# Patient Record
Sex: Male | Born: 1950
Health system: Southern US, Community
[De-identification: ages and names within clinical notes are randomized; demographics above are authoritative.]

## PROBLEM LIST (undated history)

## (undated) DIAGNOSIS — K219 Gastro-esophageal reflux disease without esophagitis: Secondary | ICD-10-CM

## (undated) DIAGNOSIS — R011 Cardiac murmur, unspecified: Secondary | ICD-10-CM

## (undated) DIAGNOSIS — D571 Sickle-cell disease without crisis: Secondary | ICD-10-CM

## (undated) DIAGNOSIS — J189 Pneumonia, unspecified organism: Secondary | ICD-10-CM

## (undated) DIAGNOSIS — E785 Hyperlipidemia, unspecified: Secondary | ICD-10-CM

## (undated) DIAGNOSIS — H269 Unspecified cataract: Secondary | ICD-10-CM

## (undated) DIAGNOSIS — Z973 Presence of spectacles and contact lenses: Secondary | ICD-10-CM

## (undated) DIAGNOSIS — Z87442 Personal history of urinary calculi: Secondary | ICD-10-CM

## (undated) DIAGNOSIS — J383 Other diseases of vocal cords: Secondary | ICD-10-CM

## (undated) DIAGNOSIS — C801 Malignant (primary) neoplasm, unspecified: Secondary | ICD-10-CM

## (undated) DIAGNOSIS — N289 Disorder of kidney and ureter, unspecified: Secondary | ICD-10-CM

## (undated) DIAGNOSIS — I10 Essential (primary) hypertension: Secondary | ICD-10-CM

## (undated) DIAGNOSIS — M1990 Unspecified osteoarthritis, unspecified site: Secondary | ICD-10-CM

## (undated) HISTORY — PX: COLON SURGERY: SHX602

## (undated) HISTORY — PX: LEG SURGERY: SHX1003

## (undated) HISTORY — PX: COLONOSCOPY: SHX174

## (undated) HISTORY — PX: CATARACT EXTRACTION W/ INTRAOCULAR LENS  IMPLANT, BILATERAL: SHX1307

## (undated) HISTORY — PX: CHOLECYSTECTOMY: SHX55

---

## 1998-01-19 ENCOUNTER — Encounter: Payer: Self-pay | Admitting: Family Medicine

## 1998-01-19 ENCOUNTER — Inpatient Hospital Stay (HOSPITAL_COMMUNITY): Admission: AD | Admit: 1998-01-19 | Discharge: 1998-01-27 | Payer: Self-pay | Admitting: Family Medicine

## 1998-11-20 ENCOUNTER — Inpatient Hospital Stay (HOSPITAL_COMMUNITY): Admission: EM | Admit: 1998-11-20 | Discharge: 1998-11-30 | Payer: Self-pay | Admitting: Emergency Medicine

## 1998-11-20 ENCOUNTER — Encounter: Payer: Self-pay | Admitting: Family Medicine

## 1998-11-21 ENCOUNTER — Encounter: Payer: Self-pay | Admitting: Family Medicine

## 1999-12-04 ENCOUNTER — Encounter: Payer: Self-pay | Admitting: Family Medicine

## 1999-12-04 ENCOUNTER — Inpatient Hospital Stay (HOSPITAL_COMMUNITY): Admission: EM | Admit: 1999-12-04 | Discharge: 1999-12-10 | Payer: Self-pay | Admitting: Emergency Medicine

## 1999-12-07 ENCOUNTER — Encounter: Payer: Self-pay | Admitting: Family Medicine

## 1999-12-09 ENCOUNTER — Encounter: Payer: Self-pay | Admitting: Family Medicine

## 2000-08-16 ENCOUNTER — Encounter: Payer: Self-pay | Admitting: Family Medicine

## 2000-08-16 ENCOUNTER — Inpatient Hospital Stay (HOSPITAL_COMMUNITY): Admission: EM | Admit: 2000-08-16 | Discharge: 2000-08-26 | Payer: Self-pay | Admitting: *Deleted

## 2000-08-17 ENCOUNTER — Encounter: Payer: Self-pay | Admitting: Family Medicine

## 2000-08-19 ENCOUNTER — Encounter: Payer: Self-pay | Admitting: Family Medicine

## 2000-08-24 ENCOUNTER — Encounter: Payer: Self-pay | Admitting: Dentistry

## 2002-10-27 ENCOUNTER — Encounter: Payer: Self-pay | Admitting: Emergency Medicine

## 2002-10-27 ENCOUNTER — Inpatient Hospital Stay (HOSPITAL_COMMUNITY): Admission: EM | Admit: 2002-10-27 | Discharge: 2002-11-11 | Payer: Self-pay | Admitting: Emergency Medicine

## 2002-10-27 ENCOUNTER — Encounter: Payer: Self-pay | Admitting: Family Medicine

## 2002-10-30 ENCOUNTER — Encounter: Payer: Self-pay | Admitting: Family Medicine

## 2002-11-03 ENCOUNTER — Encounter: Payer: Self-pay | Admitting: Family Medicine

## 2002-11-07 ENCOUNTER — Encounter: Payer: Self-pay | Admitting: Family Medicine

## 2003-08-25 ENCOUNTER — Emergency Department (HOSPITAL_COMMUNITY): Admission: EM | Admit: 2003-08-25 | Discharge: 2003-08-25 | Payer: Self-pay | Admitting: *Deleted

## 2003-08-26 ENCOUNTER — Inpatient Hospital Stay (HOSPITAL_COMMUNITY): Admission: EM | Admit: 2003-08-26 | Discharge: 2003-09-23 | Payer: Self-pay | Admitting: Family Medicine

## 2004-04-08 ENCOUNTER — Emergency Department (HOSPITAL_COMMUNITY): Admission: EM | Admit: 2004-04-08 | Discharge: 2004-04-08 | Payer: Self-pay | Admitting: Emergency Medicine

## 2004-04-09 ENCOUNTER — Inpatient Hospital Stay (HOSPITAL_COMMUNITY): Admission: EM | Admit: 2004-04-09 | Discharge: 2004-04-14 | Payer: Self-pay | Admitting: Emergency Medicine

## 2005-05-18 ENCOUNTER — Encounter: Payer: Self-pay | Admitting: Internal Medicine

## 2005-05-19 ENCOUNTER — Ambulatory Visit: Payer: Self-pay | Admitting: Dentistry

## 2005-05-19 ENCOUNTER — Inpatient Hospital Stay (HOSPITAL_COMMUNITY): Admission: EM | Admit: 2005-05-19 | Discharge: 2005-05-25 | Payer: Self-pay | Admitting: Internal Medicine

## 2005-05-23 ENCOUNTER — Encounter (INDEPENDENT_AMBULATORY_CARE_PROVIDER_SITE_OTHER): Payer: Self-pay | Admitting: *Deleted

## 2005-06-14 ENCOUNTER — Encounter: Admission: RE | Admit: 2005-06-14 | Discharge: 2005-09-12 | Payer: Self-pay | Admitting: Family Medicine

## 2005-08-21 ENCOUNTER — Inpatient Hospital Stay (HOSPITAL_COMMUNITY): Admission: RE | Admit: 2005-08-21 | Discharge: 2005-09-01 | Payer: Self-pay | Admitting: Internal Medicine

## 2007-02-24 ENCOUNTER — Emergency Department (HOSPITAL_COMMUNITY): Admission: EM | Admit: 2007-02-24 | Discharge: 2007-02-24 | Payer: Self-pay | Admitting: Emergency Medicine

## 2007-08-24 ENCOUNTER — Emergency Department (HOSPITAL_COMMUNITY): Admission: EM | Admit: 2007-08-24 | Discharge: 2007-08-24 | Payer: Self-pay | Admitting: Emergency Medicine

## 2007-09-29 ENCOUNTER — Observation Stay (HOSPITAL_COMMUNITY): Admission: EM | Admit: 2007-09-29 | Discharge: 2007-10-01 | Payer: Self-pay | Admitting: Emergency Medicine

## 2007-09-29 ENCOUNTER — Ambulatory Visit: Payer: Self-pay | Admitting: Internal Medicine

## 2008-09-10 ENCOUNTER — Encounter: Payer: Self-pay | Admitting: Cardiology

## 2008-09-10 ENCOUNTER — Inpatient Hospital Stay (HOSPITAL_COMMUNITY): Admission: EM | Admit: 2008-09-10 | Discharge: 2008-09-13 | Payer: Self-pay | Admitting: Emergency Medicine

## 2008-09-10 ENCOUNTER — Ambulatory Visit: Payer: Self-pay | Admitting: Cardiology

## 2009-03-16 ENCOUNTER — Emergency Department (HOSPITAL_COMMUNITY): Admission: EM | Admit: 2009-03-16 | Discharge: 2009-03-16 | Payer: Self-pay | Admitting: Emergency Medicine

## 2009-04-25 ENCOUNTER — Emergency Department (HOSPITAL_COMMUNITY): Admission: EM | Admit: 2009-04-25 | Discharge: 2009-04-26 | Payer: Self-pay | Admitting: Emergency Medicine

## 2010-02-18 ENCOUNTER — Inpatient Hospital Stay (HOSPITAL_COMMUNITY): Admission: EM | Admit: 2010-02-18 | Discharge: 2010-02-22 | Payer: Self-pay | Admitting: Emergency Medicine

## 2010-08-03 ENCOUNTER — Emergency Department (HOSPITAL_COMMUNITY)
Admission: EM | Admit: 2010-08-03 | Discharge: 2010-08-03 | Disposition: A | Discharge status: Home / Self Care | Payer: Medicare Other | Attending: Emergency Medicine | Admitting: Emergency Medicine

## 2010-08-03 ENCOUNTER — Emergency Department (HOSPITAL_COMMUNITY): Payer: Medicare Other

## 2010-08-03 DIAGNOSIS — Z79899 Other long term (current) drug therapy: Secondary | ICD-10-CM | POA: Insufficient documentation

## 2010-08-03 DIAGNOSIS — Z85038 Personal history of other malignant neoplasm of large intestine: Secondary | ICD-10-CM | POA: Insufficient documentation

## 2010-08-03 DIAGNOSIS — R079 Chest pain, unspecified: Secondary | ICD-10-CM | POA: Insufficient documentation

## 2010-08-03 DIAGNOSIS — E119 Type 2 diabetes mellitus without complications: Secondary | ICD-10-CM | POA: Insufficient documentation

## 2010-08-03 DIAGNOSIS — M545 Low back pain, unspecified: Secondary | ICD-10-CM | POA: Insufficient documentation

## 2010-08-03 DIAGNOSIS — I10 Essential (primary) hypertension: Secondary | ICD-10-CM | POA: Insufficient documentation

## 2010-08-03 DIAGNOSIS — D5700 Hb-SS disease with crisis, unspecified: Secondary | ICD-10-CM | POA: Insufficient documentation

## 2010-08-03 LAB — DIFFERENTIAL
Basophils Relative: 1 % (ref 0–1)
Eosinophils Absolute: 0.8 10*3/uL — ABNORMAL HIGH (ref 0.0–0.7)
Lymphocytes Relative: 16 % (ref 12–46)
Neutrophils Relative %: 69 % (ref 43–77)

## 2010-08-03 LAB — CK TOTAL AND CKMB (NOT AT ARMC)
CK, MB: 6.2 ng/mL (ref 0.3–4.0)
Relative Index: 2.5 (ref 0.0–2.5)
Total CK: 253 U/L — ABNORMAL HIGH (ref 7–232)

## 2010-08-03 LAB — COMPREHENSIVE METABOLIC PANEL
AST: 33 U/L (ref 0–37)
Albumin: 4 g/dL (ref 3.5–5.2)
Alkaline Phosphatase: 72 U/L (ref 39–117)
BUN: 14 mg/dL (ref 6–23)
Chloride: 103 mEq/L (ref 96–112)
GFR calc Af Amer: 49 mL/min — ABNORMAL LOW (ref >60)
Potassium: 4.4 mEq/L (ref 3.5–5.1)
Total Protein: 7.2 g/dL (ref 6.0–8.3)

## 2010-08-03 LAB — POCT CARDIAC MARKERS
CKMB, poc: 2.9 ng/mL (ref 1.0–8.0)
Myoglobin, poc: 179 ng/mL (ref 12–200)

## 2010-08-03 LAB — CBC
MCV: 67.1 fL — ABNORMAL LOW (ref 78.0–100.0)
Platelets: 330 10*3/uL (ref 150–400)
RBC: 4.01 MIL/uL — ABNORMAL LOW (ref 4.22–5.81)
WBC: 19.6 10*3/uL — ABNORMAL HIGH (ref 4.0–10.5)

## 2010-08-03 LAB — RETICULOCYTES
RBC.: 4.01 MIL/uL — ABNORMAL LOW (ref 4.22–5.81)
Retic Ct Pct: 5 % — ABNORMAL HIGH (ref 0.4–3.1)

## 2010-08-04 ENCOUNTER — Inpatient Hospital Stay (HOSPITAL_COMMUNITY)
Admission: EM | Admit: 2010-08-04 | Discharge: 2010-08-07 | DRG: 812 | Disposition: A | Discharge status: Home / Self Care | Payer: Medicare Other | Attending: Internal Medicine | Admitting: Internal Medicine

## 2010-08-04 DIAGNOSIS — D5700 Hb-SS disease with crisis, unspecified: Principal | ICD-10-CM | POA: Diagnosis present

## 2010-08-04 DIAGNOSIS — R17 Unspecified jaundice: Secondary | ICD-10-CM | POA: Diagnosis present

## 2010-08-04 DIAGNOSIS — H2000 Unspecified acute and subacute iridocyclitis: Secondary | ICD-10-CM | POA: Diagnosis present

## 2010-08-04 DIAGNOSIS — K219 Gastro-esophageal reflux disease without esophagitis: Secondary | ICD-10-CM | POA: Diagnosis present

## 2010-08-04 DIAGNOSIS — E119 Type 2 diabetes mellitus without complications: Secondary | ICD-10-CM | POA: Diagnosis present

## 2010-08-04 DIAGNOSIS — I10 Essential (primary) hypertension: Secondary | ICD-10-CM | POA: Diagnosis present

## 2010-08-04 DIAGNOSIS — F1410 Cocaine abuse, uncomplicated: Secondary | ICD-10-CM | POA: Diagnosis present

## 2010-08-04 DIAGNOSIS — E785 Hyperlipidemia, unspecified: Secondary | ICD-10-CM | POA: Diagnosis present

## 2010-08-04 LAB — GLUCOSE, CAPILLARY: Glucose-Capillary: 273 mg/dL — ABNORMAL HIGH (ref 70–99)

## 2010-08-05 ENCOUNTER — Emergency Department (HOSPITAL_COMMUNITY): Payer: Medicare Other

## 2010-08-05 LAB — CBC
HCT: 28.2 % — ABNORMAL LOW (ref 39.0–52.0)
HCT: 30 % — ABNORMAL LOW (ref 39.0–52.0)
Hemoglobin: 9.9 g/dL — ABNORMAL LOW (ref 13.0–17.0)
MCHC: 35.1 g/dL (ref 30.0–36.0)
MCHC: 33 g/dL (ref 30.0–36.0)
MCV: 68.6 fL — ABNORMAL LOW (ref 78.0–100.0)
RDW: 17.3 % — ABNORMAL HIGH (ref 11.5–15.5)

## 2010-08-05 LAB — COMPREHENSIVE METABOLIC PANEL
ALT: 23 U/L (ref 0–53)
Albumin: 4.3 g/dL (ref 3.5–5.2)
Alkaline Phosphatase: 81 U/L (ref 39–117)
Alkaline Phosphatase: 76 U/L (ref 39–117)
BUN: 11 mg/dL (ref 6–23)
CO2: 25 mEq/L (ref 19–32)
CO2: 25 mEq/L (ref 19–32)
Calcium: 9.7 mg/dL (ref 8.4–10.5)
Chloride: 100 mEq/L (ref 96–112)
GFR calc non Af Amer: 59 mL/min — ABNORMAL LOW (ref >60)
GFR calc non Af Amer: >60 mL/min (ref >60)
Glucose, Bld: 267 mg/dL — ABNORMAL HIGH (ref 70–99)
Glucose, Bld: 180 mg/dL — ABNORMAL HIGH (ref 70–99)
Potassium: 4.2 mEq/L (ref 3.5–5.1)
Potassium: 4.1 mEq/L (ref 3.5–5.1)
Sodium: 138 mEq/L (ref 135–145)
Total Bilirubin: 1.9 mg/dL — ABNORMAL HIGH (ref 0.3–1.2)
Total Bilirubin: 1.7 mg/dL — ABNORMAL HIGH (ref 0.3–1.2)

## 2010-08-05 LAB — POCT CARDIAC MARKERS: Myoglobin, poc: 169 ng/mL (ref 12–200)

## 2010-08-05 LAB — GLUCOSE, CAPILLARY
Glucose-Capillary: 244 mg/dL — ABNORMAL HIGH (ref 70–99)
Glucose-Capillary: 210 mg/dL — ABNORMAL HIGH (ref 70–99)

## 2010-08-05 LAB — DIFFERENTIAL
Eosinophils Relative: 1 % (ref 0–5)
Eosinophils Relative: 2 % (ref 0–5)
Lymphs Abs: 1.5 10*3/uL (ref 0.7–4.0)
Lymphs Abs: 2.4 10*3/uL (ref 0.7–4.0)
Monocytes Absolute: 2.4 10*3/uL — ABNORMAL HIGH (ref 0.1–1.0)
Monocytes Relative: 14 % — ABNORMAL HIGH (ref 3–12)
Monocytes Relative: 14 % — ABNORMAL HIGH (ref 3–12)
Neutro Abs: 13.1 10*3/uL — ABNORMAL HIGH (ref 1.7–7.7)

## 2010-08-05 LAB — RAPID URINE DRUG SCREEN, HOSP PERFORMED
Amphetamines: NOT DETECTED
Tetrahydrocannabinol: NOT DETECTED

## 2010-08-06 LAB — GLUCOSE, CAPILLARY
Glucose-Capillary: 218 mg/dL — ABNORMAL HIGH (ref 70–99)
Glucose-Capillary: 134 mg/dL — ABNORMAL HIGH (ref 70–99)
Glucose-Capillary: 127 mg/dL — ABNORMAL HIGH (ref 70–99)

## 2010-08-06 LAB — BASIC METABOLIC PANEL
BUN: 7 mg/dL (ref 6–23)
Calcium: 8.9 mg/dL (ref 8.4–10.5)
GFR calc non Af Amer: >60 mL/min (ref >60)
Glucose, Bld: 202 mg/dL — ABNORMAL HIGH (ref 70–99)

## 2010-08-06 LAB — CBC
HCT: 24.2 % — ABNORMAL LOW (ref 39.0–52.0)
MCHC: 33.1 g/dL (ref 30.0–36.0)
MCV: 68.4 fL — ABNORMAL LOW (ref 78.0–100.0)
RDW: 16.7 % — ABNORMAL HIGH (ref 11.5–15.5)

## 2010-08-06 LAB — HEMOGLOBIN A1C: Hgb A1c MFr Bld: 7.2 % — ABNORMAL HIGH (ref <5.7)

## 2010-08-07 LAB — BASIC METABOLIC PANEL
Calcium: 9 mg/dL (ref 8.4–10.5)
GFR calc Af Amer: >60 mL/min (ref >60)
GFR calc non Af Amer: >60 mL/min (ref >60)
Potassium: 3.4 mEq/L — ABNORMAL LOW (ref 3.5–5.1)
Sodium: 137 mEq/L (ref 135–145)

## 2010-08-07 LAB — CBC
HCT: 23.7 % — ABNORMAL LOW (ref 39.0–52.0)
Hemoglobin: 7.8 g/dL — ABNORMAL LOW (ref 13.0–17.0)
MCH: 22.8 pg — ABNORMAL LOW (ref 26.0–34.0)
MCV: 69.3 fL — ABNORMAL LOW (ref 78.0–100.0)
Platelets: 224 10*3/uL (ref 150–400)
RBC: 3.42 MIL/uL — ABNORMAL LOW (ref 4.22–5.81)
RDW: 17 % — ABNORMAL HIGH (ref 11.5–15.5)

## 2010-08-11 NOTE — Discharge Summary (Addendum)
NAMEENRIQUEZ, ROYE                ACCOUNT NO.:  [id number]  MEDICAL RECORD NO.:  [id number]           PATIENT TYPE:  I  LOCATION:  [id number]                         FACILITY:  [facility]  PHYSICIAN:  Jacquelynn Cree, M.D.   DATE OF BIRTH:  1950/11/15  DATE OF ADMISSION:  08/04/2010 DATE OF DISCHARGE:  08/07/2010                              DISCHARGE SUMMARY   PRIMARY CARE PHYSICIAN:  Rushie Nyhan, M.D.  DISCHARGE DIAGNOSES: 1. Sickle cell anemia. 2. Hemoglobin SS disease. 3. Vasoocclusive sickle cell crisis. 4. Hypertension. 5. Type 2 diabetes. 6. History of hyperlipidemia. 7. Cocaine abuse. 8. Constipation.  DISCHARGE MEDICATIONS: 1. MiraLax 17 g p.o. daily p.r.n. constipation. 2. Metformin increased to 1000 mg p.o. b.i.d. 3. Amlodipine/benazepril 10/20 mg 1 capsule p.o. daily. 4. Aspirin 81 mg p.o. daily. 5. Folic acid 1 mg p.o. daily. 6. Glucotrol XL 10 mg p.o. daily. 7. Omeprazole 20 mg p.o. daily. 8. Percocet 5/325 mg 1 tablet q.4-6 h. p.r.n. pain (#30 written for)  CONSULTATIONS:  None.  BRIEF ADMISSION HPI:  The patient is a 60 year old male with past medical history of sickle cell, hemoglobin SS disease who presented to the hospital with arthralgias consistent with a usual sickle cell pain crisis.  The patient was subsequently given IV pain medicines while in the emergency department and because of ongoing problems with pain, was referred for inpatient management.  For full details, please see the dictated report done by Dr. Maudie Mercury.  PROCEDURES AND DIAGNOSTIC STUDIES:  Chest x-ray on August 05, 2010, showed no active cardiopulmonary disease.  DISCHARGE LABORATORY VALUES:  Hemoglobin A1c was 7.2.  Sodium was 137, potassium 3.9, chloride 108, bicarb 24, BUN 7, creatinine 1.14, glucose 202, calcium 8.9.  White blood cell count was 15.5, hemoglobin 8, hematocrit 24.2, platelets 177.  Urine drug screen was positive for cocaine and opiate (the patient was treated with  opiate).  HOSPITAL COURSE BY PROBLEMS: 1. Sickle cell anemia/hemoglobin SS disease/vasoocclusive crisis:  The     patient was admitted and given supportive care including IV fluid     hydration and IV pain medications.  The patient did not have any     evidence of acute chest syndrome and responded well to IV pain     medicines.  He is now confident that he can control his pain with     oral Percocet and feels well enough for discharge. 2. Type 2 diabetes:  The patient's glycemic control was fairly     suboptimal throughout the hospital stay.  Despite this, his     hemoglobin A1c indicates he has fairly good outpatient control.  At     this point, I will simply increase his metformin at discharge to     1000 mg b.i.d. with instructions to follow up closely with his     primary care physician to ensure that his diabetes is well     controlled. 3. Hypertension:  The patient's blood pressure is well controlled on     his home regimen, which was continued. 4. Gastroesophageal reflux disease:  The patient was placed on proton  pump inhibitor therapy. 5. Cocaine abuse:  The patient was provided with an ADS referral prior     to discharge.  DISPOSITION:  The patient is medically stable and be discharged home.  CONDITION AT DISCHARGE:  Improved.  Time spent in coordinating care for discharge and discharge instructions including face-to-face time equals 25 minutes.     Jacquelynn Cree, M.D.     CR/MEDQ  D:  08/07/2010  T:  08/08/2010  Job:  [id number]  cc:   Rushie Nyhan, M.D. [name]  Electronically Signed by Jacquelynn Cree M.D. on 08/09/2010 06:16:10 PM Electronically Signed by Jacquelynn Cree M.D. on 08/09/2010 06:28:16 PM Electronically Signed by Jacquelynn Cree M.D. on 08/09/2010 07:43:20 PM

## 2010-08-17 ENCOUNTER — Ambulatory Visit (HOSPITAL_COMMUNITY): Admission: RE | Admit: 2010-08-17 | Payer: Medicare Other | Source: Ambulatory Visit | Admitting: Ophthalmology

## 2010-08-18 LAB — GLUCOSE, CAPILLARY
Glucose-Capillary: 100 mg/dL — ABNORMAL HIGH (ref 70–99)
Glucose-Capillary: 131 mg/dL — ABNORMAL HIGH (ref 70–99)
Glucose-Capillary: 152 mg/dL — ABNORMAL HIGH (ref 70–99)
Glucose-Capillary: 167 mg/dL — ABNORMAL HIGH (ref 70–99)
Glucose-Capillary: 171 mg/dL — ABNORMAL HIGH (ref 70–99)
Glucose-Capillary: 175 mg/dL — ABNORMAL HIGH (ref 70–99)
Glucose-Capillary: 187 mg/dL — ABNORMAL HIGH (ref 70–99)
Glucose-Capillary: 188 mg/dL — ABNORMAL HIGH (ref 70–99)
Glucose-Capillary: 202 mg/dL — ABNORMAL HIGH (ref 70–99)
Glucose-Capillary: 212 mg/dL — ABNORMAL HIGH (ref 70–99)
Glucose-Capillary: 296 mg/dL — ABNORMAL HIGH (ref 70–99)
Glucose-Capillary: 355 mg/dL — ABNORMAL HIGH (ref 70–99)
Glucose-Capillary: 87 mg/dL (ref 70–99)
Glucose-Capillary: 99 mg/dL (ref 70–99)
Glucose-Capillary: 99 mg/dL (ref 70–99)
Glucose-Capillary: >600 mg/dL (ref 70–99)
Glucose-Capillary: >600 mg/dL (ref 70–99)
Glucose-Capillary: >600 mg/dL (ref 70–99)
Glucose-Capillary: >600 mg/dL (ref 70–99)

## 2010-08-18 LAB — CBC
HCT: 23 % — ABNORMAL LOW (ref 39.0–52.0)
Hemoglobin: 7.4 g/dL — ABNORMAL LOW (ref 13.0–17.0)
Hemoglobin: 7.5 g/dL — ABNORMAL LOW (ref 13.0–17.0)
Hemoglobin: 8 g/dL — ABNORMAL LOW (ref 13.0–17.0)
Hemoglobin: 9 g/dL — ABNORMAL LOW (ref 13.0–17.0)
Hemoglobin: 9.5 g/dL — ABNORMAL LOW (ref 13.0–17.0)
MCH: 23 pg — ABNORMAL LOW (ref 26.0–34.0)
MCH: 23.1 pg — ABNORMAL LOW (ref 26.0–34.0)
MCH: 23.8 pg — ABNORMAL LOW (ref 26.0–34.0)
MCH: 23.8 pg — ABNORMAL LOW (ref 26.0–34.0)
MCHC: 32 g/dL (ref 30.0–36.0)
MCHC: 32.5 g/dL (ref 30.0–36.0)
MCHC: 32.8 g/dL (ref 30.0–36.0)
MCHC: 33.5 g/dL (ref 30.0–36.0)
MCV: 70.7 fL — ABNORMAL LOW (ref 78.0–100.0)
MCV: 70.8 fL — ABNORMAL LOW (ref 78.0–100.0)
MCV: 70.8 fL — ABNORMAL LOW (ref 78.0–100.0)
MCV: 74.4 fL — ABNORMAL LOW (ref 78.0–100.0)
Platelets: 244 10*3/uL (ref 150–400)
Platelets: 266 10*3/uL (ref 150–400)
Platelets: 325 10*3/uL (ref 150–400)
RBC: 3.29 MIL/uL — ABNORMAL LOW (ref 4.22–5.81)
RBC: 3.44 MIL/uL — ABNORMAL LOW (ref 4.22–5.81)
RBC: 3.99 MIL/uL — ABNORMAL LOW (ref 4.22–5.81)
RBC: 4.21 MIL/uL — ABNORMAL LOW (ref 4.22–5.81)
RDW: 14.3 % (ref 11.5–15.5)
RDW: 15.6 % — ABNORMAL HIGH (ref 11.5–15.5)
RDW: 15.9 % — ABNORMAL HIGH (ref 11.5–15.5)
WBC: 13.1 10*3/uL — ABNORMAL HIGH (ref 4.0–10.5)
WBC: 13.4 10*3/uL — ABNORMAL HIGH (ref 4.0–10.5)
WBC: 13.5 10*3/uL — ABNORMAL HIGH (ref 4.0–10.5)
WBC: 14.5 10*3/uL — ABNORMAL HIGH (ref 4.0–10.5)

## 2010-08-18 LAB — DIFFERENTIAL
Basophils Absolute: 0 10*3/uL (ref 0.0–0.1)
Basophils Absolute: 0.3 10*3/uL — ABNORMAL HIGH (ref 0.0–0.1)
Basophils Relative: 2 % — ABNORMAL HIGH (ref 0–1)
Eosinophils Absolute: 2 10*3/uL — ABNORMAL HIGH (ref 0.0–0.7)
Eosinophils Relative: 13 % — ABNORMAL HIGH (ref 0–5)
Lymphocytes Relative: 23 % (ref 12–46)
Lymphs Abs: 3 10*3/uL (ref 0.7–4.0)
Monocytes Absolute: 1.4 10*3/uL — ABNORMAL HIGH (ref 0.1–1.0)
Monocytes Relative: 10 % (ref 3–12)
Neutro Abs: 7.4 10*3/uL (ref 1.7–7.7)
Neutrophils Relative %: 44 % (ref 43–77)

## 2010-08-18 LAB — OSMOLALITY, URINE: Osmolality, Ur: 412 mOsm/kg (ref 390–1090)

## 2010-08-18 LAB — COMPREHENSIVE METABOLIC PANEL
ALT: 23 U/L (ref 0–53)
ALT: 25 U/L (ref 0–53)
ALT: 30 U/L (ref 0–53)
AST: 28 U/L (ref 0–37)
AST: 32 U/L (ref 0–37)
Albumin: 3.3 g/dL — ABNORMAL LOW (ref 3.5–5.2)
Albumin: 3.5 g/dL (ref 3.5–5.2)
Alkaline Phosphatase: 49 U/L (ref 39–117)
Alkaline Phosphatase: 52 U/L (ref 39–117)
Alkaline Phosphatase: 67 U/L (ref 39–117)
BUN: 14 mg/dL (ref 6–23)
CO2: 23 mEq/L (ref 19–32)
CO2: 25 mEq/L (ref 19–32)
CO2: 25 mEq/L (ref 19–32)
Calcium: 10.5 mg/dL (ref 8.4–10.5)
Calcium: 9.1 mg/dL (ref 8.4–10.5)
Chloride: 107 mEq/L (ref 96–112)
Chloride: 111 mEq/L (ref 96–112)
Creatinine, Ser: 1.21 mg/dL (ref 0.4–1.5)
GFR calc Af Amer: 47 mL/min — ABNORMAL LOW (ref >60)
GFR calc Af Amer: >60 mL/min (ref >60)
GFR calc non Af Amer: 39 mL/min — ABNORMAL LOW (ref >60)
GFR calc non Af Amer: 53 mL/min — ABNORMAL LOW (ref >60)
GFR calc non Af Amer: >60 mL/min (ref >60)
Glucose, Bld: 147 mg/dL — ABNORMAL HIGH (ref 70–99)
Glucose, Bld: 193 mg/dL — ABNORMAL HIGH (ref 70–99)
Potassium: 3.9 mEq/L (ref 3.5–5.1)
Potassium: 4.1 mEq/L (ref 3.5–5.1)
Potassium: 4.3 mEq/L (ref 3.5–5.1)
Sodium: 138 mEq/L (ref 135–145)
Sodium: 138 mEq/L (ref 135–145)
Sodium: 139 mEq/L (ref 135–145)
Sodium: 139 mEq/L (ref 135–145)
Total Protein: 6.2 g/dL (ref 6.0–8.3)
Total Protein: 6.6 g/dL (ref 6.0–8.3)

## 2010-08-18 LAB — BASIC METABOLIC PANEL
BUN: 16 mg/dL (ref 6–23)
BUN: 22 mg/dL (ref 6–23)
BUN: 30 mg/dL — ABNORMAL HIGH (ref 6–23)
CO2: 21 mEq/L (ref 19–32)
CO2: 25 mEq/L (ref 19–32)
Calcium: 9.4 mg/dL (ref 8.4–10.5)
Calcium: 9.6 mg/dL (ref 8.4–10.5)
Calcium: 9.7 mg/dL (ref 8.4–10.5)
Calcium: 9.7 mg/dL (ref 8.4–10.5)
Calcium: 9.8 mg/dL (ref 8.4–10.5)
Chloride: 104 mEq/L (ref 96–112)
Chloride: 108 mEq/L (ref 96–112)
Chloride: 89 mEq/L — ABNORMAL LOW (ref 96–112)
Creatinine, Ser: 1.17 mg/dL (ref 0.4–1.5)
Creatinine, Ser: 1.54 mg/dL — ABNORMAL HIGH (ref 0.4–1.5)
Creatinine, Ser: 1.54 mg/dL — ABNORMAL HIGH (ref 0.4–1.5)
Creatinine, Ser: 2 mg/dL — ABNORMAL HIGH (ref 0.4–1.5)
GFR calc Af Amer: 41 mL/min — ABNORMAL LOW (ref >60)
GFR calc Af Amer: 49 mL/min — ABNORMAL LOW (ref >60)
GFR calc Af Amer: 56 mL/min — ABNORMAL LOW (ref >60)
GFR calc Af Amer: >60 mL/min (ref >60)
GFR calc non Af Amer: 34 mL/min — ABNORMAL LOW (ref >60)
GFR calc non Af Amer: 40 mL/min — ABNORMAL LOW (ref >60)
GFR calc non Af Amer: 46 mL/min — ABNORMAL LOW (ref >60)
GFR calc non Af Amer: 46 mL/min — ABNORMAL LOW (ref >60)
GFR calc non Af Amer: 47 mL/min — ABNORMAL LOW (ref >60)
Glucose, Bld: 155 mg/dL — ABNORMAL HIGH (ref 70–99)
Glucose, Bld: 158 mg/dL — ABNORMAL HIGH (ref 70–99)
Potassium: 3.9 mEq/L (ref 3.5–5.1)
Potassium: 4.1 mEq/L (ref 3.5–5.1)
Potassium: 5 mEq/L (ref 3.5–5.1)
Sodium: 134 mEq/L — ABNORMAL LOW (ref 135–145)
Sodium: 136 mEq/L (ref 135–145)
Sodium: 137 mEq/L (ref 135–145)

## 2010-08-18 LAB — RETICULOCYTES
RBC.: 3.36 MIL/uL — ABNORMAL LOW (ref 4.22–5.81)
Retic Count, Absolute: 151.2 10*3/uL (ref 19.0–186.0)
Retic Count, Absolute: 171.5 10*3/uL (ref 19.0–186.0)
Retic Ct Pct: 4.5 % — ABNORMAL HIGH (ref 0.4–3.1)
Retic Ct Pct: 5 % — ABNORMAL HIGH (ref 0.4–3.1)

## 2010-08-18 LAB — CROSSMATCH: Antibody Screen: NEGATIVE

## 2010-08-18 LAB — PHOSPHORUS: Phosphorus: 2.9 mg/dL (ref 2.3–4.6)

## 2010-08-18 LAB — RAPID URINE DRUG SCREEN, HOSP PERFORMED
Amphetamines: NOT DETECTED
Cocaine: NOT DETECTED
Tetrahydrocannabinol: NOT DETECTED

## 2010-08-18 LAB — URINALYSIS, ROUTINE W REFLEX MICROSCOPIC
Bilirubin Urine: NEGATIVE
Glucose, UA: 500 mg/dL — AB
Ketones, ur: NEGATIVE mg/dL
Leukocytes, UA: NEGATIVE
Nitrite: NEGATIVE
Nitrite: NEGATIVE
Protein, ur: NEGATIVE mg/dL
Specific Gravity, Urine: 1.012 (ref 1.005–1.030)
pH: 5 (ref 5.0–8.0)

## 2010-08-18 LAB — MAGNESIUM
Magnesium: 2 mg/dL (ref 1.5–2.5)
Magnesium: 2.3 mg/dL (ref 1.5–2.5)

## 2010-08-18 LAB — KETONES, URINE: Ketones, ur: NEGATIVE mg/dL

## 2010-08-18 LAB — OSMOLALITY: Osmolality: 292 mOsm/kg (ref 275–300)

## 2010-09-07 LAB — DIFFERENTIAL
Basophils Relative: 0 % (ref 0–1)
Eosinophils Relative: 2 % (ref 0–5)
Lymphs Abs: 2 10*3/uL (ref 0.7–4.0)
Monocytes Absolute: 1.3 10*3/uL — ABNORMAL HIGH (ref 0.1–1.0)

## 2010-09-07 LAB — CBC
HCT: 28.5 % — ABNORMAL LOW (ref 39.0–52.0)
Hemoglobin: 9.1 g/dL — ABNORMAL LOW (ref 13.0–17.0)
MCHC: 32 g/dL (ref 30.0–36.0)
RDW: 16.4 % — ABNORMAL HIGH (ref 11.5–15.5)

## 2010-09-07 LAB — BASIC METABOLIC PANEL
BUN: 7 mg/dL (ref 6–23)
CO2: 28 mEq/L (ref 19–32)
Calcium: 9.3 mg/dL (ref 8.4–10.5)
Glucose, Bld: 254 mg/dL — ABNORMAL HIGH (ref 70–99)
Potassium: 3.5 mEq/L (ref 3.5–5.1)
Sodium: 136 mEq/L (ref 135–145)

## 2010-09-07 LAB — RETICULOCYTES
RBC.: 3.99 MIL/uL — ABNORMAL LOW (ref 4.22–5.81)
Retic Count, Absolute: 215.5 10*3/uL — ABNORMAL HIGH (ref 19.0–186.0)

## 2010-09-07 LAB — POCT CARDIAC MARKERS
CKMB, poc: 2.5 ng/mL (ref 1.0–8.0)
Myoglobin, poc: 221 ng/mL (ref 12–200)

## 2010-09-08 LAB — CBC
Hemoglobin: 8.6 g/dL — ABNORMAL LOW (ref 13.0–17.0)
MCHC: 32.5 g/dL (ref 30.0–36.0)
MCV: 74.9 fL — ABNORMAL LOW (ref 78.0–100.0)
RBC: 3.55 MIL/uL — ABNORMAL LOW (ref 4.22–5.81)

## 2010-09-08 LAB — DIFFERENTIAL
Basophils Relative: 0 % (ref 0–1)
Eosinophils Absolute: 1 10*3/uL — ABNORMAL HIGH (ref 0.0–0.7)
Monocytes Absolute: 1.4 10*3/uL — ABNORMAL HIGH (ref 0.1–1.0)
Monocytes Relative: 8 % (ref 3–12)
Neutro Abs: 10.5 10*3/uL — ABNORMAL HIGH (ref 1.7–7.7)

## 2010-09-08 LAB — RETICULOCYTES: Retic Count, Absolute: 286.7 10*3/uL — ABNORMAL HIGH (ref 19.0–186.0)

## 2010-09-14 LAB — CBC
HCT: 30.5 % — ABNORMAL LOW (ref 39.0–52.0)
HCT: 31.1 % — ABNORMAL LOW (ref 39.0–52.0)
HCT: 32.4 % — ABNORMAL LOW (ref 39.0–52.0)
Hemoglobin: 10.1 g/dL — ABNORMAL LOW (ref 13.0–17.0)
Hemoglobin: 10.4 g/dL — ABNORMAL LOW (ref 13.0–17.0)
Hemoglobin: 9.9 g/dL — ABNORMAL LOW (ref 13.0–17.0)
MCHC: 32.2 g/dL (ref 30.0–36.0)
MCHC: 32.4 g/dL (ref 30.0–36.0)
MCV: 73.6 fL — ABNORMAL LOW (ref 78.0–100.0)
MCV: 74.2 fL — ABNORMAL LOW (ref 78.0–100.0)
Platelets: 235 K/uL (ref 150–400)
Platelets: 248 K/uL (ref 150–400)
RBC: 4.11 MIL/uL — ABNORMAL LOW (ref 4.22–5.81)
RBC: 4.17 MIL/uL — ABNORMAL LOW (ref 4.22–5.81)
RBC: 4.4 MIL/uL (ref 4.22–5.81)
RDW: 17.1 % — ABNORMAL HIGH (ref 11.5–15.5)
RDW: 17.7 % — ABNORMAL HIGH (ref 11.5–15.5)
RDW: 18.5 % — ABNORMAL HIGH (ref 11.5–15.5)
WBC: 14.1 10*3/uL — ABNORMAL HIGH (ref 4.0–10.5)
WBC: 16 K/uL — ABNORMAL HIGH (ref 4.0–10.5)
WBC: 17.4 K/uL — ABNORMAL HIGH (ref 4.0–10.5)

## 2010-09-14 LAB — RAPID URINE DRUG SCREEN, HOSP PERFORMED
Barbiturates: NOT DETECTED
Benzodiazepines: NOT DETECTED
Cocaine: POSITIVE — AB

## 2010-09-14 LAB — GLUCOSE, CAPILLARY
Glucose-Capillary: 135 mg/dL — ABNORMAL HIGH (ref 70–99)
Glucose-Capillary: 151 mg/dL — ABNORMAL HIGH (ref 70–99)
Glucose-Capillary: 166 mg/dL — ABNORMAL HIGH (ref 70–99)
Glucose-Capillary: 175 mg/dL — ABNORMAL HIGH (ref 70–99)
Glucose-Capillary: 191 mg/dL — ABNORMAL HIGH (ref 70–99)
Glucose-Capillary: 191 mg/dL — ABNORMAL HIGH (ref 70–99)
Glucose-Capillary: 193 mg/dL — ABNORMAL HIGH (ref 70–99)
Glucose-Capillary: 209 mg/dL — ABNORMAL HIGH (ref 70–99)

## 2010-09-14 LAB — BASIC METABOLIC PANEL
CO2: 30 mEq/L (ref 19–32)
Calcium: 9.3 mg/dL (ref 8.4–10.5)
Chloride: 98 mEq/L (ref 96–112)
Creatinine, Ser: 0.97 mg/dL (ref 0.4–1.5)
Creatinine, Ser: 1.03 mg/dL (ref 0.4–1.5)
GFR calc Af Amer: >60 mL/min (ref >60)
GFR calc Af Amer: >60 mL/min (ref >60)
GFR calc non Af Amer: >60 mL/min (ref >60)
Glucose, Bld: 163 mg/dL — ABNORMAL HIGH (ref 70–99)
Potassium: 3.3 mEq/L — ABNORMAL LOW (ref 3.5–5.1)
Sodium: 136 mEq/L (ref 135–145)

## 2010-09-14 LAB — POCT I-STAT, CHEM 8
Calcium, Ion: 1.22 mmol/L (ref 1.12–1.32)
HCT: 37 % — ABNORMAL LOW (ref 39.0–52.0)
Hemoglobin: 12.6 g/dL — ABNORMAL LOW (ref 13.0–17.0)
TCO2: 27 mmol/L (ref 0–100)

## 2010-09-14 LAB — DIFFERENTIAL
Basophils Absolute: 0 10*3/uL (ref 0.0–0.1)
Basophils Absolute: 0 K/uL (ref 0.0–0.1)
Basophils Relative: 0 % (ref 0–1)
Basophils Relative: 0 % (ref 0–1)
Eosinophils Absolute: 0.2 K/uL (ref 0.0–0.7)
Eosinophils Absolute: 0.3 10*3/uL (ref 0.0–0.7)
Eosinophils Relative: 1 % (ref 0–5)
Eosinophils Relative: 1 % (ref 0–5)
Lymphocytes Relative: 13 % (ref 12–46)
Lymphocytes Relative: 8 % — ABNORMAL LOW (ref 12–46)
Lymphs Abs: 1.4 10*3/uL (ref 0.7–4.0)
Lymphs Abs: 2 K/uL (ref 0.7–4.0)
Monocytes Absolute: 2.1 10*3/uL — ABNORMAL HIGH (ref 0.1–1.0)
Monocytes Absolute: 2.7 K/uL — ABNORMAL HIGH (ref 0.1–1.0)
Monocytes Relative: 17 % — ABNORMAL HIGH (ref 3–12)
Neutro Abs: 11 K/uL — ABNORMAL HIGH (ref 1.7–7.7)
Neutro Abs: 12.4 10*3/uL — ABNORMAL HIGH (ref 1.7–7.7)
Neutro Abs: 13.8 10*3/uL — ABNORMAL HIGH (ref 1.7–7.7)
Neutrophils Relative %: 69 % (ref 43–77)
Neutrophils Relative %: 72 % (ref 43–77)

## 2010-09-14 LAB — PROTIME-INR
INR: 1.1 (ref 0.00–1.49)
Prothrombin Time: 14.1 seconds (ref 11.6–15.2)

## 2010-09-14 LAB — URINALYSIS, ROUTINE W REFLEX MICROSCOPIC
Glucose, UA: 100 mg/dL — AB
Nitrite: NEGATIVE
pH: 5 (ref 5.0–8.0)

## 2010-09-14 LAB — RETICULOCYTES: Retic Count, Absolute: 282.8 10*3/uL — ABNORMAL HIGH (ref 19.0–186.0)

## 2010-09-14 LAB — TROPONIN I: Troponin I: <0.01 ng/mL (ref 0.00–0.06)

## 2010-09-14 LAB — LIPID PANEL
HDL: 44 mg/dL (ref >39)
Total CHOL/HDL Ratio: 2.9 RATIO
VLDL: 14 mg/dL (ref 0–40)

## 2010-09-14 LAB — CARDIAC PANEL(CRET KIN+CKTOT+MB+TROPI)
CK, MB: 7.3 ng/mL — ABNORMAL HIGH (ref 0.3–4.0)
Total CK: 509 U/L — ABNORMAL HIGH (ref 7–232)
Troponin I: <0.01 ng/mL (ref 0.00–0.06)

## 2010-09-14 LAB — TSH: TSH: 1.634 u[IU]/mL (ref 0.350–4.500)

## 2010-09-14 LAB — CK TOTAL AND CKMB (NOT AT ARMC): Relative Index: 1.9 (ref 0.0–2.5)

## 2010-09-14 LAB — POCT CARDIAC MARKERS

## 2010-09-14 LAB — APTT: aPTT: 27 seconds (ref 24–37)

## 2010-09-15 NOTE — H&P (Signed)
  NAME:  [name], [name]                ACCOUNT NO.:  [id number]  MEDICAL RECORD NO.:  [id number]           PATIENT TYPE:  E  LOCATION:  WLED                         FACILITY:  [facility]  PHYSICIAN:  Arlyss Repress, MD        DATE OF BIRTH:  1951/04/18  DATE OF ADMISSION:  08/04/2010 DATE OF DISCHARGE:                             HISTORY & PHYSICAL   CHIEF COMPLAINT:  "I have got sickle cell pain."  HISTORY OF PRESENT ILLNESS:  This 60 year old male with a history of sickle cell apparently presents with complaints of sickle cell crisis starting about 2 to 3 days ago.  He was seen in the ER yesterday and treated but relapsed again today.  He states that his pain is in all of his joints.  He does not have any complaints of any chest pain at this time.  The patient will be admitted for sickle cell crisis.  PAST MEDICAL HISTORY: 1. Sickle cell anemia. 2. Hemoglobin SS disease. 3. Hypertension. 4. Diabetes type 2. 5. Hyperlipidemia. 6. Cocaine abuse. 7. History of impaired vision in the right eye since last episode of     uncontrolled blood sugar. 8. History of colonic polyp.  PAST SURGICAL HISTORY:  Colon cancer resection, cholecystectomy, left leg surgery after blood clot.  SOCIAL HISTORY:  The patient does not smoke or drink.  He is a former smoker.  He is on social security disability.  Previously, he was a Administrator.  FAMILY HISTORY:  Mother died of a blood clot.  Father died of throat cancer.  ALLERGIES:  MORPHINE.  MEDICATIONS:  Please see med rec.  REVIEW OF SYSTEMS:  Negative for all 10 organ systems except for pertinent positives stated above.  PHYSICAL EXAMINATION:  VITAL SIGNS:  Temperature 98.6, pulse 76, blood pressure 129/90, pulse ox is 97% on room air. HEENT:  Anicteric. NECK:  No JVD, no bruit. HEART:  Regular rate and rhythm.  S1, S2. LUNGS:  Clear to auscultation bilaterally. ABDOMEN:  Soft, nontender, nondistended.  Positive bowel sounds. EXTREMITIES:   No cyanosis, clubbing or edema. SKIN:  No rashes. LYMPH NODES:  No adenopathy. NEURO EXAM:  Nonfocal.  LABORATORY DATA:  Reticulocyte absolute 226.1 (high).  Sodium 131, potassium 4.2, BUN 11, creatinine 1.25, AST 37, ALT 26.  Troponin I less than 0.05.  Chest x-ray negative for any acute process.  ASSESSMENT AND PLAN: 1. Sickle cell crisis:  Hydrate with normal saline IV, pain control     with Dilaudid, and Benadryl for any itching. 2. Diabetes:  The patient will continue on glipizide and metformin.     Fingerstick blood sugars a.c. and h.s., NovoLog sensitive sliding     scale. 3. Hypertension:  Continue amlodipine, benazepril. 4. Gastroesophageal reflux disease:  Continue omeprazole. 5. Deep vein thrombosis prophylaxis:  SCDs.     Arlyss Repress, MD     JYK/MEDQ  D:  08/05/2010  T:  08/05/2010  Job:  [id number]  cc:   Rushie Nyhan, M.D. [phone]  Electronically Signed by Jani Gravel MD on 09/15/2010 12:53:15 AM

## 2010-10-18 NOTE — Discharge Summary (Signed)
NAMEJOACHIM, [name]                ACCOUNT NO.:  [id number]   MEDICAL RECORD NO.:  [id number]          PATIENT TYPE:  INP   LOCATION:  5524                         FACILITY:  [facility]   PHYSICIAN:  Leana Gamer, MDDATE OF BIRTH:  [birth year]   DATE OF ADMISSION:  09/10/2008  DATE OF DISCHARGE:  09/13/2008                               DISCHARGE SUMMARY   FINAL DISCHARGE DIAGNOSES:  1. Chest pain, noncardiac.  2. Cocaine abuse.  3. Hemoglobin-SS with vasoocclusive pain syndrome.  4. Chronic leukocytosis.  5. Diabetes, type 2, poorly controlled.  6. Hypertension .  7. Right shoulder rotator cuff tear, chronic.  8. Right shoulder pain secondary to #1.  9. 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   DISCHARGE DISPOSITION:  Home.   DISCHARGE MEDICATIONS:  Include the following:  1. Oxycodone 5 to 10 mg p.o. q.4 hours p.r.n.  2. Oxy-Contin 10 mg p.o. q.12 hours.  3. Metformin 1000 mg p.o. b.i.d.  4. Folate 1 mg p.o. daily.  5. Omeprazole 20 mg p.o. daily.  6. Amlodipine/Benazepril 10/20 mg p.o. daily.  7. Lisinopril/hydrochlorothiazide 20/12.5 mg p.o. daily.   PROCEDURES:  None.   DIAGNOSTIC STUDIES:  1. Two-view chest x-ray done on admission on 04/08, which shows      borderline cardiomegaly.  Linear scarring in the right lower lung      base.  No active process evident.  Sclerotic changes of the bone.  2. Chest x-ray, two-view, done on 04/10, which shows no acute      cardiopulmonary process.  3. MRI of the right shoulder done on 04/11, which showed the      following:      a.     A ruptured and retracted supraspinatus tendon.      b.     Highly indistinct and probably ruptured infraspinatus tendon       with atrophy at the infraspinatus muscle.      c.     Mild to moderate subscapularis tendinopathy.      d.     Suspected partial tear of the intraarticular portion of the       long head of the biceps.      e.     Unfavorable subacromial morphology.   Suspected synovitis.      f.     Mixed sclerosis and edema signal on the humeral head likely       due to a combination of avascular necrosis and marrow edema       related to the patient's sickle cell disease.   CONSULTANTS:  Consultation with Orthopedics, who recommends outpatient  followup for consideration of shoulder reconstruction.   CODE STATUS:  Full Code.   ALLERGIES:  MORPHINE SULFATE.   CHIEF COMPLAINT:  Chest pain.   HISTORY OF PRESENT ILLNESS:  Please refer to the H and P dictated by Dr.  Hal Hope for details of the HPI.   HOSPITAL COURSE:  1. Chest pain.  The patient was admitted with chest pain.  He had      serial enzymes done, which were all  negative.  Cardiology was      consulted, who felt that the patient had no active cardiac issues      and that the chest pain was likely related to his cocaine use.  The      patient had 1 episode of chest pain while hospitalized, and no      further chest pain without any intervention.  His chest pain was      mostly located on the right side.  When questioning the patient      further, it was felt that the pain was radiating from the right      shoulder, which brings me to problem #2.  2. Right shoulder pain.  The patient reports that he fell      approximately 3 to 4 months ago and since then has been having      right shoulder pain.  Upon evaluation of the patient's shoulder,      the patient has 0 to 1 strength in the right shoulder; however,      passive range of motion, although painful, is within normal limits.      An MRI of the shoulder was done, which showed the findings as noted      above.  The patient will likely require a reconstructive surgery      for the right shoulder in order for it to be functional.  In the      meantime, I would recommend that the patient be referred for      outpatient physical therapy by his doctor.  I have instructed the      patient in exercises that he can do at home to maintain  his range      of motion.  3. Hemoglobin-SS with pain.  The patient does have hemoglobin-SS and      came in with complaints of pain, however, the pain was concentrated      in the right shoulder and I suspect that this is not necessarily      pain associated with sickle cell, but rather pain associated with      the shoulder.  4. Chronic leukocytosis.  The patient has a leukocytosis with his      white blood cell count ranging approximately 16.  The patient was      ruled out for infection with cultures, chest x-ray, and urinalysis.      I suspect that the patient's leukocytosis may be related to high      marrow turnover associated with his sickle cell in addition to an      inflammatory state associated with the right shoulder injury.  The      patient does not have any symptoms or signs of osteomyelitis with      the MRI that was done.  We have discussed, the patient and I, his      use of pain medication for sickle cell.  The patient has been using      NSAIDs excessively for his pain, and I have spoken to him about the      damage that this can do to the kidney in addition to precipitating      GI blood loss.  The patient is hesitant to use narcotics but has      agreed to the possible use of narcotics and is being given a      prescription for both OxyContin and Oxycodone.  5. Diabetes, type 2.  The patient  had been on metformin 500 mg p.o.      b.i.d.  During his hospitalization, the patient was shown to have      elevated blood sugars on this dose of metformin.  A hemoglobin A1c      was attempted, however, could not be resolved due to interaction      with abnormal hemoglobin of sickle cell.  The patient's metformin      has been increased to 1000 mg and the patient has been maintaining      blood sugars around 120s to 140s on this dose of metformin.  6.Hypertension, the blood pressures were adequately controlled but still  not at goal for a patient with diabetes and will need  to be titrated.  Otherwise, the patient has remained stable.   FOLLOWUP:  The patient needs to go back and see Dr. Ricke Hey,  his primary care physician, and I would recommend that Dr. Alyson Ingles  generates a referral to Orthopedic Surgery for this gentleman  particularly in light of the fact that he is right-handed and has had an  injury for 3 months, which will likely require shoulder reconstructive  surgery.  He needs closer followup for his diabetes than he has been  presenting with.  The patient states that he has not seen Dr. Alyson Ingles  in approximately 6 months, and I have impressed upon the patient the  importance of him taking ownership for his healthcare and presenting to  Dr. Noland Fordyce office in a scheduled and timely manner.   DIETARY RESTRICTIONS:  The patient should be on a diabetic, heart  healthy diet.   PHYSICAL RESTRICTIONS:  Activity as tolerated.   RECOMMENDATIONS:  I have recommended a referral for outpatient physical  therapy.   TOTAL TIME FOR DISCHARGE:  Forty-two minutes.      Leana Gamer, MD  Electronically Signed     MAM/MEDQ  D:  09/13/2008  T:  09/13/2008  Job:  [id number]   cc:   Rushie Nyhan, M.D.
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
## 2010-10-18 NOTE — Consult Note (Signed)
NAME:  [name], [name]                ACCOUNT NO.:  [id number]   MEDICAL RECORD NO.:  [id number]          PATIENT TYPE:  EMS   LOCATION:  MAJO                         FACILITY:  [facility]   PHYSICIAN:  Denice Bors. Stanford Breed, MD, FACCDATE OF BIRTH:  11-01-1950   DATE OF CONSULTATION:  DATE OF DISCHARGE:                                 CONSULTATION   PRIMARY CARDIOLOGIST:  Denice Bors. Stanford Breed, MD, [facility]   PRIMARY CARE PHYSICIAN:  Dr. Alyson Ingles.   CONSULTING PHYSICIAN:  [facility].   REASON FOR CONSULTATION:  Chest pain with new left bundle branch block.   HISTORY OF PRESENT ILLNESS:  A 60 year old African American male with  complaints of chest pain on and off since 8:00 a.m. this morning,  feeling like someone sitting on my chest.  The patient admits to  cocaine use last night.  He heavily along with beer.  The pain is worse  with deep breaths.  He has a history of sickle cell crisis and the pain  is very similar to that, also he states that it is similar to what his  blood glucose is elevated.  The patient admits to medical noncompliance  on taking these medications for 3 months.  The patient also has not been  followed by physician in several months as well.  The patient states  that he had been off cocaine for approximately 3 months, but fell off  awaken last night with friends.  The patient is currently complaining  of 10/10 chest pain.  EKG reveals a new left bundle branch block with  ventricular rate of 90 beats per minute.  The patient is hypertensive  with the blood pressure of 159/114.  The patient prior to being seen by  [place], was given heparin bolus and started on heparin.  I have asked the  patient to be started on nitroglycerin.  The patient was seen emergently  by Dr. Kirk Ruths cardiologist and echocardiogram was completed at  bedside revealing normal LV function with no evidence of tamponade or LV  dysfunction at present.   REVIEW OF SYSTEMS:  Positive for clamminess, sudden  onset of chest pain,  pressure, shortness of breath, nausea, vomiting, diaphoresis, and  clamminess.  All other systems reviewed and found to be negative.   PAST MEDICAL HISTORY:  Diabetes, hypertension, sickle cell anemia with  history of crisis last admission in 2007 for this.  Recent cocaine use.   PAST SURGICAL HISTORY:  Cholecystectomy and removal of colon cancer when  he was in his 19s.  He lives in [place].  He does not smoke, positive  for beer daily.  Positive for cocaine use within the less 24 hours.   FAMILY HISTORY:  Uncertain at this time as the patient is in acute  state.   CURRENT MEDICATIONS:  He has not been taking his medications regularly  for the last 3 months.  The last medication list that we have in 2007,  which was Nexium 40 mg p.o. daily, Glucophage 500 mg b.i.d., Lotrel  [id number] p.o. daily, folic acid 1 mg daily, glipizide p.o. daily.  He also  was on Lotrel 5/20 p.o. daily.   ALLERGIES:  The patient is allergic to MORPHINE.   CURRENT LABORATORY DATA:  Hemoglobin 10.0, hematocrit 31.2, white blood  cells 16.5, platelets 255.  Sodium 136, potassium 3.8, chloride 100, CO2  of 27, BUN 8, creatinine 1.1, glucose 172, MB 213, troponin less than  0.05, PTT 27, PT 14.1, INR 1.1.   EKG revealing left bundle-branch block with a ventricular rate of 90  beats per minute.   PHYSICAL EXAMINATION:  VITAL SIGNS:  Blood pressure 159/94, pulse 90,  respirations 30, O2 sat 100% on 2 L.  GENERAL:  He is awake, alert, complaining of severe chest discomfort.  HEENT:  Head is normocephalic and atraumatic.  Eyes, PERRLA.  Mucous  membranes of mouth are pink and moist.  Tongue is midline.  NECK:  Supple.  There is no JVD.  No carotid bruits appreciated.  CARDIOVASCULAR:  Tachycardic with 1/6 systolic murmur auscultated.  Pulses are 2+ and equal without bruits.  LUNGS:  Essentially clear to auscultation.  He is not taking deep  breaths as it is very painful for him.   ABDOMEN:  Soft with some mild lower abdominal tenderness, 2+ bowel  sounds.  EXTREMITIES:  Without clubbing, cyanosis, or edema.  NEURO:  Cranial nerves II through XII are grossly intact.   IMPRESSION:  1. Chest pain.  2. Sickle cell crisis.  3. Diabetes.  4. Hypertension.  5. Recent cocaine use.  6. Medical noncompliance.   PLAN:  This is a 60 year old African American male with past medical  history of diabetes, sickle cell disease, hypertension, and history of  colon cancer status post resection who has used cocaine within the last  24 hours.  The patient developed diffuse chest discomfort around 8:00  a.m. this morning, described as a sharp pain and heaviness with no  radiation with increasing coughing and pain with inspiration, positive  for nausea and vomiting.  The patient states the pain has been  continuous, stopping and starting over and over with very little rest in  between.  The patient has not been taking medications regularly.  He  states his pain is similar to the sickle cell crisis pain he has had in  the past.  EKG revealing normal sinus rhythm with a new left bundle  branch block.   Symptoms are most consistent with sickle cell crisis, also with new left  bundle branch block, we will hydrate.  The patient is positive for  cocaine use.  We will need to exclude acute infarct.  We will check stat  enzymes and echocardiogram for wall motion (this has been done at  bedside revealing normal wall motion).  We will treat with aspirin,  nitroglycerin, hold heparin, add pain medicines and continue to monitor.  The patient will be followed making further recommendations throughout  hospital course.      Phill Myron. Purcell Nails, NP      Denice Bors. Stanford Breed, MD, [facility]  Electronically Signed    KML/MEDQ  D:  09/10/2008  T:  09/11/2008  Job:  [id number]

## 2010-10-18 NOTE — H&P (Signed)
NAME:  [name], [name]                ACCOUNT NO.:  [id number]   MEDICAL RECORD NO.:  [id number]          PATIENT TYPE:  EMS   LOCATION:  MAJO                         FACILITY:  [facility]   PHYSICIAN:  Rise Patience, MDDATE OF BIRTH:  03-22-1951   DATE OF ADMISSION:  09/10/2008  DATE OF DISCHARGE:                              HISTORY & PHYSICAL   PRIMARY CARE PHYSICIAN:  Dr. Tillman Sers.   CHIEF COMPLAINT:  Chest pain.   HISTORY OF PRESENT ILLNESS:  A 60 year old male with known history of  sickle cell disease, diabetes mellitus type 2, and hypertension who has  history of polysubstance abuse presented with a complaint of chest pain.  The patient stated the chest pain started early in the morning at around  8 o'clock in the a.m.  It was retrosternal and nonradiating, present  even at rest and was getting increasingly intense when he decided to  come to the ER.  In the ER, the patient had an EKG which was showing  LBBB which was new.  Cardiology was consulted and as per cardiology at  this time, they felt the patient's chest pain is more due to sickle cell  crisis rather than any acute MI.  Anyway, the patient has been admitted  to rule out ACS.  The patient was initially started on IV heparin and  nitroglycerin.  At this time, the heparin infusion has been discontinued  but he will be continuing with nitroglycerin.  I rediscussed with the PA  of cardiology who said that the patient did have a 2-D echo which did  not show any wall motion abnormalities and he will follow up with the  official results.  At this time, the patient has been admitted for  observation for his chest pain.  Presently, the patient is chest pain  free.  He denies any shortness of breath and denies any palpitations,  dizziness, loss of consciousness, nausea, vomiting or diaphoresis.  The  patient stated that he did take some cocaine last night with his friends  along with some alcohol.  He woke up with the  chest pain.  He denies any  abdominal pain, nausea, vomiting, diarrhea, dysuria or discharges.   PAST MEDICAL HISTORY:  Hypertension, diabetes mellitus type 2, and  polysubstance abuse.   PAST SURGICAL HISTORY:  Left tibial surgery for bone infection and says  he had a polyp removal from the colon.   MEDICATIONS ON ADMISSION:  1. Metformin 500 mg p.o. b.i.d.  2. Lisinopril/HCTZ 20/12.5 mg p.o. daily.  3. Amlodipine/benazepril 20/10 mg p.o. daily.  4. Omeprazole 20 mg p.o. daily.   ALLERGIES:  Morphine.   SOCIAL HISTORY:  The patient denies smoking cigarettes, drinks alcohol  occasionally, every week, a beer, and has a history of cocaine abuse.  The patient has been strongly cautioned about this.   FAMILY HISTORY:  Nothing contributory.   REVIEW OF SYSTEMS:  As in history of presenting illness, nothing else  significant.   PHYSICAL EXAMINATION:  The patient examined at bedside, not in acute  distress.  VITAL SIGNS:  Blood pressure is  155/88, pulse 70 per minute, temperature  98.5, respirations 18, O2 sat 100%.  HEENT:  Anicteric.  No pallor.  CHEST:  Bilateral air entry present.  No rhonchi.  No crepitations.  HEART:  S1, S2 heard.  ABDOMEN:  Soft, nontender, bowel sounds heard.  CNS:  Awake, alert and oriented to time, place, and person.  Moves upper  and lower extremities 5/5.  EXTREMITIES:  Peripheral pulses felt.  No edema.   LABORATORY DATA:  EKG shows LBBB pattern.  CBC:  WBC 16.5, hemoglobin  12.6, hematocrit 37, platelets 255.  PT/INR 14.1 and 1.1.  Retic count  is 6.5.  Basic metabolic panel:  Sodium [id number], potassium 3.8, chloride  100, glucose 172, BUN 8, creatinine 1.1.  CK 507, CK-MB 9.5, troponin I  less than 0.01 and less than 0.05.  Drug screen positive for cocaine.  Urine is positive for glucose, ketones negative, nitrates and leukocytes  negative.   ASSESSMENT:  1. Chest pain with new left bundle branch block.  2. Sickle cell pain crisis.  3.  Hypertension.  4. Diabetes mellitus type 2.  5. Polysubstance abuse.   PLAN:  Will admit the patient to the CCU.  Will continue with IV  nitroglycerin drip.  DVT and GI prophylaxis.  Will repeat CBC and BMET  in a.m.  Will continue with IV hydration and pain relief medications.   Appreciate cardiology consult and further recommendations pending the  patient's response to the above.      Rise Patience, MD  Electronically Signed     ANK/MEDQ  D:  09/10/2008  T:  09/10/2008  Job:  [id number]

## 2010-10-21 NOTE — H&P (Signed)
[facility]  Patient:    [name], [name]                         MRN: [id number] Adm. Date:  [id number] Attending:  Larina Earthly D.                         History and Physical  DATE OF BIRTH: January 29, 1951  CHIEF COMPLAINT: This patient is a 60 year old African-American male who presents to the emergency department with complaint of left arm and shoulder pain x 1 week.  HISTORY OF PRESENT ILLNESS: The patient states the pain is consistent with that experienced during a vaso-occlusive crisis associated with sickle cell disease.  He does has a history of sickle cell and states that his last crisis was in February 2001.  He denies nausea and vomiting, diarrhea, headache, constipation, fever, chills, cough, shortness of breath, or chest pain.  No dysuria, increased urgency or frequency.  He also has a past medical history of diabetes and hypertension.  He is unable to recall the names of his medications at this time and states that in general Demerol, Phenergan, and Dilaudid usually help with his pain.  On admission to the emergency department his pain was rated as 7/10.  PAST MEDICAL HISTORY:  1. Hypertension.  2. Diabetes.  3. Sickle cell.  MEDICATIONS:  1. Folic acid.  2. Prevacid (he is not sure of the dosage).  3. Vicodin.  4. Maybe Lotensin, but he is not sure.  ALLERGIES: MORPHINE.  FAMILY HISTORY: Noncontributory.  REVIEW OF SYSTEMS: Negative except as per HPI.  PHYSICAL EXAMINATION:  VITAL SIGNS: Blood pressure 138/80, pulse rate 98, temperature 98.1 degrees, respiratory rate 20.  Oxygen saturation 98% on room air.  GENERAL: The patient is in moderate distress secondary to arm and shoulder pain.  HEENT: Mucous membranes moist.  Sclerae icterus.  PERRL.  NECK: Supple without adenopathy.  LUNGS: Clear to auscultation bilaterally.  HEART: Regular rate and rhythm, no murmurs.  ABDOMEN: Soft, nontender, nondistended, with  normoactive bowel sounds.  EXTREMITIES: Without clubbing, cyanosis, or edema.  There is left shoulder tenderness to palpation.  NEUROLOGIC: Nonfocal.  LABORATORY DATA: WBC 19.2, hemoglobin 10.7, hematocrit 32.2, platelets 334,000; reticulocyte count 6.2.  Urinalysis negative.  EKG showed normal sinus rhythm.  ASSESSMENT/PLAN:  1. This patient is a 60 year old African-American with sickle cell disease,     currently in vaso-occlusive crisis.  Will admit and place the patient on     Dilaudid for pain, IV fluids, and sickle cell protocol.  2. Diabetes.  Accu-Cheks a.c. and h.s.  Will start Glucophage 1000 mg q.d.  3. Hypertension.  Lotensin 10 mg q.d. and will obtain records from     Dr. Ruel Favors office. DD:  12/09/99 TD:  12/09/99 Job: [id number]

## 2010-10-21 NOTE — Discharge Summary (Signed)
NAME:  [name], LEVENTHAL                          ACCOUNT NO.:  [id number]   MEDICAL RECORD NO.:  [id number]                   PATIENT TYPE:  INP   LOCATION:  [id number]                                 FACILITY:  [facility]   PHYSICIAN:  Rushie Nyhan, M.D.           DATE OF BIRTH:  11/08/1950   DATE OF ADMISSION:  08/26/2003  DATE OF DISCHARGE:  09/23/2003                                 DISCHARGE SUMMARY   ADMISSION DIAGNOSES:  1. SS vaso-occlusive crisis.  2. __________ .  3. __________ .  4. __________.   DISCHARGE DIAGNOSES:  1. SS vaso-occlusive crisis.  2. Avascular necrosis of hips.   DISCHARGE CONDITION:  Stable.   DISCHARGE DISPOSITION:  Follow up in the office in 1 week.   DISCHARGE MEDICATIONS:  1. __________  p.r.n. pain.  2. Ibuprofen 800 mg t.i.d. p.r.n. pain.  3. The patient is to resume his home treatment for his diabetes.  This is     Glucophage XR 5 mg b.i.d. and Glucotrol XL 10 mg daily.  4. He also is to continue his Lotrel 10/20 b.i.d.   HOSPITAL COURSE:  The patient was treated with IV fluid of D5/one-half  normal saline at 150 mL/hour.  He was given Dilaudid 3 mg q.2h. p.r.n. and  Phenergan 25 mg p.o. q.4h. p.r.n.  IV team was requested related to [facility] line  as the patient had no venous access.  Blood cultures x2 were drawn when he  spiked a fever and Avelox 400 mg given daily.  The patient had altered  sensorium and general strangeness of affect and spiral CT of chest was done.  He was given Lovenox 80 mg subcu q.12h. beginning on August 27, 2003.  This  was discontinued on March 26.  The patient developed a rectus abdominal mass  which was felt to be a hematoma from __________  trauma.  This was quite  tender and limited his mobility.  CT scan of the abdomen basically confirmed  that it was a hematoma in the rectus sheath __________ .  The patient had  basically lost his ability to walk and __________  he regain this function.  He was transfused several  units of packed red blood cells during his  hospital stay.  This totaled 4 units.  He was provided a PIC line and  [place] was asked to provide PICC instructions and care.  He gradually improved and was able to be discharged for outpatient  management.  The patient's labs were repeated.  A white count was __________  .  At discharge white count was 10.8 with a hemoglobin 9.3; hematocrit 28.3;  and platelets 576,000.  Retic was 1.0.  Chemistries were not specifically  remarkable except for glucose fluctuations.  He did have  a BUN of 34, creatinine 1.5 which corrected to BUN 11 and creatinine 0.9  prior to discharge.  B natriuretic peptide was 244.  Blood cultures were  negative.  Catheter tip culture grew 50,000 colonies __________  on March  31.  The patient gradually improved and was discharged for outpatient  management.                                               Rushie Nyhan, M.D.    WWM/MEDQ  D:  10/22/2003  T:  10/22/2003  Job:  [id number]

## 2010-10-21 NOTE — Discharge Summary (Signed)
[name], [name]                ACCOUNT NO.:  [id number]   MEDICAL RECORD NO.:  [id number]          PATIENT TYPE:  INP   LOCATION:  0260                         [facility]ACILITY[facility]   PHYSICIAN:  Rushie Nyhan, M.D.DATE OF BIRTH:  [birth year]   DATE OF ADMISSION:  04/09/2004  DATE OF DISCHARGE:  04/14/2004                                 DISCHARGE SUMMARY   ADMISSION DIAGNOSES:  1.  SS vaso-occlusive crisis, sickle cell.  2.  Noninsulin-dependent diabetes mellitus.  3.  Substance abuse history.  4.  Disability secondary to #1.  5.  Hypertension.   DISCHARGE DIAGNOSES:  1.  SS vaso-occlusive crisis, sickle cell.  2.  Noninsulin-dependent diabetes mellitus.  3.  Substance abuse history.  4.  Disability secondary to #1.  5.  Hypertension.   DISCHARGE CONDITION:  Stable.   DISPOSITION:  Follow up in approximately one week.   DISCHARGE MEDICATIONS:  1.  Glucophage XR 500 mg 2 pills twice a day.  2.  Glucotrol XL 10 mg 1 pill twice daily.  3.  Lotrel b.i.d., question 5/10 versus 10/20, patient not sure.  4.  Methadone 10 mg q.i.d. p.r.n. pain.  5.  Klonopin 0.5 mg t.i.d.  6.  Folate 1 mg daily.  7.  Nexium 40 mg daily.   HISTORY:  This patient is a 60 year old single black male who has known SS  sickle cell disease. He began having pain that was not relieved by home  medications and presented to the emergency room where he was not able to be  controlled and was thus referred for inpatient status. He states that his  home medicines are Glucophage and Glucotrol that he takes twice daily and a  blood pressure pill, Lotrel, that he takes twice daily but is uncertain of  the dose. He also takes folic acid 1 mg daily and Nexium 4 mg daily. He was  also given apparently in the interim a type of oxycodone short acting to  use.   The patient's social history, personal history, review of systems, past  medical history, and family history are outlined in admission history and  physical.   PHYSICAL EXAMINATION:  VITAL SIGNS:  Revealed blood pressure 142/85, pulse  92, respirations 20, temperature 99.9. Pulse oximetry was 97% on room air.  GENERAL:  The patient was alert and appeared in moderate discomfort. He was  fully oriented. He lying supine on bed and at times sitting on the bedside.  HEENT:  Revealed no significant sclerae icterus. Eyes:  Equally round and  reactive to light and accommodation. Extraocular movements were intact.  Mouth exam revealed no significant caries. Pharynx was normal.  NECK:  Supple.  CHEST:  Clear to auscultation.  HEART:  Regular rate and rhythm without murmur.  ABDOMEN:  Soft and flat.  EXTREMITIES:  Revealed no edema.  SKIN:  Revealed no significant lesions.  NEUROLOGICAL:  He was ambulatory with fair balance.   HOSPITAL COURSE:  The patient was given intravenous fluids of D5 1/2 normal  saline to follow that which he received in the emergency room which was  normal saline. He was given Dilaudid and Phenergan p.o. to help preserve his  veins. He was continued on his home medicines except for the blood pressure  here. He had labs done which did not show significant abnormality. CBC  showed white count of 5.2 with hemoglobin 10.2, platelets 310,000. Retic  count was 6.8 with chemistries all normal except for glucose of 203.  Urinalysis was normal. The patient's chest x-ray had no active disease. The  patient is thus discharged for outpatient management. His daughters did  request that he have a home health aid to observe him at home. Social work  is to explain to him whether he qualifies for such which at this time I  think he does.   Followup chemistries had no significant negative change. His alkaline  phosphatase was 118 with SGOT of 52, and glucose was 203. The patient's  retic count was 6.8      WWM/MEDQ  D:  04/14/2004  T:  04/15/2004  Job:  [id number]

## 2010-10-21 NOTE — Op Note (Signed)
[facility]  Patient:    [name], [name]                         MRN: [id number] Adm. Date:  [id number] Disc. Date: [id number] Attending:  Othella Boyer                           Operative Report  PREOPERATIVE DIAGNOSIS:  Multiple decayed teeth.  POSTOPERATIVE DIAGNOSIS:  Multiple decayed teeth.  PROCEDURE:  Surgical extraction of teeth #6, 7, and 8.  SURGEON:  Roney Jaffe, D.D.S.  ANESTHESIA:  MAC.  ESTIMATED BLOOD LOSS:  Minimal.  INDICATIONS:  I was consulted for [name] regarding painful teeth in the right maxilla.  Due to his complaints of significant pain, as well as his history of sickle cell-associated pain, it was requested that the teeth be removed prior to discharge from the hospital.  Considering that these teeth were essentially retained roots, firmly fixed to the bone, it was determined that he would be taken to the operating room and sedated so that the teeth may be removed comfortably.  PROCEDURE:  The patient was brought to the operating room and placed in a supine position.  Once an adequate level of sedation was achieved, local anesthetic was injected into the right maxilla adjacent to teeth #6, 7, and 8. After an adequate time for hemostasis was allowed to be achieved, an incision was made with a #15 blade around teeth #6, 7, and 8.  The teeth were exposed on the buccal surface, and the teeth were manipulated.  An attempt was made to remove each tooth.  Only tooth #7 came out without difficulty.  Teeth #6 and 8 required sectioning and bone removal due to the size of the roots.  These teeth were sectioned with a fissure bur and copious amounts of normal saline irrigation, and removed without complications.  Any bone irregularities were smoothed with the bone file.  This site was irrigated with normal saline and closed using a 3-0 chromic suture in an interrupted fashion.  Gauze was placed over this site.  [name]  remained stable throughout the procedure.  He was allowed to awaken and transferred to the recovery room in stable and satisfactory condition. DD:  08/24/00 TD:  08/27/00 Job: [id number]

## 2010-10-21 NOTE — Discharge Summary (Signed)
[facility]  Patient:    CORRIN, HUBBART                         MRN: [id number] Adm. Date:  12/04/99 Disc. Date: 12/10/99 Attending:  Rushie Nyhan, M.D.                           Discharge Summary  ADMISSION DIAGNOSIS:  Sickle cell vaso-occlusive crisis.  DISCHARGE DIAGNOSES: 1. Sickle cell vaso-occlusive crisis with left shoulder and arm pain    consistent wit avascular necrosis. 2. Non-insulin-dependent diabetes mellitus. 3. Hypertension.  DISCHARGE CONDITION:  Stable.  DISCHARGE MEDICATIONS: 1. Darvocet-N 100 mg q.4-6h. p.r.n. pain. 2. Lotensin 10 mg daily. 3. Glucophage [id number] mg b.i.d. 4. Folic acid 1 mg daily.  HISTORY:  This patient is a 60 year old black, married male who presented to the emergency room with left arm and shoulder pain of one week duration.  The patient was consistent with that experienced during sickle cell crisis.  He denied nausea, vomiting, diarrhea, headache, constipation, chills, fever, shortness of breath, or chest pain.  He had no dysuria.  He has history also of diabetes and hypertension.  The patient was admitted to the hospital by Dr. Larina Earthly who was on call for Dr. Henrene Pastor.  PAST MEDICAL HISTORY, FAMILY HISTORY, PERSONAL HISTORY, SOCIAL HISTORY, REVIEW OF SYSTEMS:  Outline in admission History and Physical.  PHYSICAL EXAMINATION:  VITAL SIGNS:  Blood pressure 138/80, pulse 98, temperature 98.1, respirations 20, oxygen saturation 98% on room air.  GENERAL:  The patient appeared in moderate distress.  HEENT:  Mucous membranes moist, sclerae icteric.  NECK:  Supple without adenopathy.  CHEST:  Clear to auscultation.  HEART:  Regular rhythm and rate with no murmur.  ABDOMEN: Soft and nondistended with active bowel sounds.  EXTREMITIES:  Left shoulder tender to palpation.  NEUROLOGIC:  Grossly physiologic.  LABORATORY DATA:  Chest x-ray revealed chronic lung disease with no  acute abnormality.  Left humerus revealed no evidence of acute injury or infection.  There was evidence of chronic bone infarctions consistent with the patients history of sickle cell disease.  Left shoulder x-ray revealed no evidence of acute injury with evidence of chronic osseous infarctions.  CBC revealed a white count of 19,400, hematocrit 32.2, hemoglobin 10.7, and platelets 334,000.  Repeat prior to discharge revealed white count 22,100 with hemoglobin 8.4, hematocrit 26.3, and platelets 334,000.  White blood morphology revealed sickle cells, target cells, and polychromasia. Chemistries were not significantly remarkable.  Glucose was 121, total bilirubin 1.9 and 2.3.  Cardiac enzymes were negative.  Urinalysis was normal. Blood cultures and urine cultures had no growth.  HOSPITAL COURSE:  The patient was admitted to the hospital and given intravenous medicines of Dilaudid 2 mg q.3h. with Phenergan p.r.n..  He was given IV fluids of 0.5 normal saline at 200 cc per hour initially and ibuprofen p.o.   Dilaudid was increased to 4 mg q.2h. due to intensity of pain.  He thus improved gradually and was able to be transitioned to oral medicines of OxyContin 20 mg b.i.d.  He thus was able to be discharged to outpatient management. DD:  01/12/00 TD:  01/13/00 Job: [id number]

## 2010-10-21 NOTE — Op Note (Signed)
NAMEORESTE, CASTIGLIONI                ACCOUNT NO.:  [id number]   MEDICAL RECORD NO.:  [id number]          PATIENT TYPE:  INP   LOCATION:  3017                         FACILITY:  [facility]   PHYSICIAN:  Lenn Cal, D.D.S.DATE OF BIRTH:  1950-06-30   DATE OF PROCEDURE:  05/24/2005  DATE OF DISCHARGE:  05/19/2005                                 OPERATIVE REPORT   PREOPERATIVE DIAGNOSES:  1.  Diabetes mellitus.  2.  History of hyperglycemia.  3.  Acute pulpitis.  4.  Chronic apical periodontitis.  5.  Chronic periodontitis.  6.  Accretions.  7.  Bilateral mandibular lingual tori.   POSTOPERATIVE DIAGNOSES:  1.  Diabetes mellitus.  2.  History of hyperglycemia.  3.  Acute pulpitis.  4.  Chronic apical periodontitis.  5.  Chronic periodontitis.  6.  Accretions.  7.  Bilateral mandibular lingual tori.   OPERATIONS:  1.  Multiple extractions of tooth numbers 1, 4, 5, 9, 10, 11, 14, 15, 20, 21      and 28.  2.  Four quadrants of alveoloplasty.  3.  Bilateral mandibular lingual tori reductions.  4.  Submission of the periapical cyst at the apices of tooth #21 and #28 to      pathology to rule out malignancy.   SURGEON:  Lenn Cal, D.D.S.   ASSISTANT:  Oneida Arenas (Art therapist).   ANESTHESIA:  General anesthesia via nasoendotracheal tube medicines.   MEDICATIONS:  1.  Clindamycin 600 mg IV prior to invasive dental procedures.  2.  Local anesthesia with total utilization of seven carpules each      containing 36 mg of  Xylocaine with 0.018 mg of epinephrine as well as      two carpules each containing 9 mg of bupivacaine with 0.009 mg of      epinephrine.   SPECIMENS:  1.  Eleven teeth which were discarded.  2.  Periapical cyst associated with the apex of tooth #21.  3.  Periapical cyst associated with the apex of tooth #28.   CULTURES:  None.   DRAINS:  None.  .   COMPLICATIONS:  None.   ESTIMATED BLOOD LOSS:  Less than 100 mL.   FLUIDS:  1000 mL  lactated Ringer solution..   INDICATIONS:  The patient was admitted with a history of hyperglycemia.  Dental consultation was requested to rule out dental infection which would  affect the patient's systemic health and lead to elevated glucose levels.  The patient was examined and treatment planned for multiple extractions with  alveoloplasty, bilateral mandibular lingual tori reductions and gross  debridement of the remaining dentition.  This treatment plan was formulated  to decrease the risk and complications associated with dental infection from  affecting the patient's systemic health.   OPERATIVE FINDINGS:  The patient was examined in operating room #4.  The  teeth were identified for extraction. The patient was noted to be affected  by chronic apical periodontitis, history of acute pulpitis, chronic  periodontitis, accretions, and bilateral mandibular lingual tori. The  aforementioned necessitated removal of tooth numbers #1, 4, 5,  9, 10, 11,  14, 15, 20, 21 and 28 with alveoloplasty, bilateral mandibular lingual tori  reductions, and gross debridement of the remaining dentition.   DESCRIPTION OF PROCEDURE:  The patient was brought to main operating room  #4.  The patient was then placed in the supine position on operating room  table.  General anesthesia was induced with a nasoendotracheal tube. Throat  pack was placed at this time. The patient was then prepped and draped in the  usual manner for a dental medicine procedure.  The oral cavity was  thoroughly examined with the findings as noted above.  The patient was then  ready for the dental medicine procedure as follows:   Local anesthesia was administered sequentially over the 2-1/2-hour long  procedure with a total utilization of seven carpules each containing 36 mg  IV lidocaine with 0.018 mg of epinephrine as well as two carpules each  containing 9 mg of bupivacaine with 0.009 mg  of epinephrine medicines.   The  maxillary quadrants were first approached.  Anesthesia was delivered as  previously described. A 15 blade incision was made from the distal of #1 and  extended to the mesial of number #8.  A surgical flap was then carefully  reflected.  A surgical handpiece and bur and copious amounts of sterile  saline were utilized to remove buccal and interseptal bone around tooth  numbers #1, 4, 5.  These teeth were then subluxated with a series of  straight elevators.  These teeth were then removed with a 150 forceps  without further complications.  Alveoplasty was then performed utilizing  rongeurs and bone file. The surgical site was then irrigated with copious  amounts sterile saline.  The surgical site was then closed from the  maxillary right tuberosity and extended to the mesial of #8 utilizing 3-0  chromic gut suture in a continuous interrupted suture technique x1.  Two  interrupted sutures were utilized to further close the surgical sites.   The maxillary left quadrant was then approached.  A 15 blade incision was  made from the maxillary left tuberosity and extended to the mesial of #9.  A  surgical flap was then carefully reflected.  An appropriate amount of facial  and interseptal bone was then removed around tooth numbers 9, 10, 11, 14 and  15 with a surgical handpiece and bur and copious amounts of sterile saline.  These teeth were then subluxated with a series of straight elevators.  Teeth  numbers 9, 10, 11 were then removed with a 150 forceps without  complications. Tooth numbers 14 and 15 were then removed with the 53L  forceps without complications.  Alveoloplasty was then performed utilizing  rongeurs and bone file.  The surgical site was then irrigated with copious  amounts sterile saline.  The surgical site was then closed utilizing 3-0  chromic gut suture in a continuous interrupted suture technique from the maxillary left tuberosity and extended to the mesial of #9.  Three   interrupted sutures were then utilized to further close the surgical site as  indicated.   The mandibular teeth were then approached.  The patient was given bilateral  mandibular inferior alveolar nerve blocks utilizing the bupivacaine.  Further infiltration was then achieved utilizing the lidocaine  appropriately.  A 15 blade incision was made from the distal of #19 and  extended to the mesial of #22.  The surgical flap was then carefully  reflected.  Buccal and interseptal bone was then removed  around tooth  numbers 20 and 21.  These teeth were then subluxated with a series of  straight elevators.  Tooth #20 was removed without complications. The  coronal segment of tooth #21 was removed at this time.  Further removal of  bone with a surgical handpiece and bur and copious amounts of sterile saline  was achieved.  This root was then elevated out with a Public house [employer].  At  this point in time, the flap was reflected further on the lingual aspect to  expose the  mandibular left lingual tori.  This was then surgically removed  with a handpiece and bur and copious amounts of sterile saline.  Alveoplasty  was then performed utilizing rongeurs and bone file.  The surgical site was  then irrigated x2 with a sterile saline. The tissues were approximated and  trimmed appropriately.  The surgical site was then closed from the distal of  #19 and extended to the distal of  #22 utilizing 3-0 chromic gut suture in a  continuous interrupted suture technique x1.  An interrupted suture was  placed in approximately between tooth numbers 22 and 23 appropriately  utilizing 3-0 chromic gut suture material.   The mandibular right quadrant was then approached.  A 15 blade incision was  made from the distal of #30 and extended to the mesial of #27.  A surgical  flap was then carefully reflected.  Appropriate amounts of buccal and  interseptal bone was removed around tooth #28 with a surgical handpiece and   bur and copious amounts of sterile saline. This tooth was then subluxated  and removed with a 151 forceps without complications.  At this point in time  a lingual flap was reflected to expose the mandibular right tori.  These  tori were then removed with a surgical handpiece and bur and copious amounts  of sterile saline. Alveoloplasty was then performed utilizing rongeurs and  bone file.  The surgical site was then irrigated with copious amounts  sterile saline x2.  The surgical site was then closed from the distal #30  and extended to the distal of #27 utilizing 3-0 chromic gut suture and a  continuous interrupted suture technique x1.  Two interrupted sutures were  then placed interproximally between tooth numbers 26/27 and 25/26.   At this point in time, a curette was utilized to remove accretions around  tooth numbers 22-27 appropriately.  A [employer] sonic scaler was then utilized to remove further accretions. At this point in time, the entire mouth was  irrigated with copious amounts of sterile saline.  The patient was examined  for complications; seeing none, the dental medicine procedure was deemed to  be complete.  At this point in time, the throat pack was removed.  A series  of 4x4 gauzes were placed in the mouth to aid hemostasis. An oral airway was  placed at the request of the anesthesia team.  The patient was then handed  over the anesthesia team for final disposition. After a proper amount of  time, the patient was extubated, taken to the post anesthesia care unit with  stable vital signs and a good oxygenation level.  All counts were correct  for dental medicine procedure.  Please note: The periapical cysts associated  with tooth  numbers 21 and 28 were submitted to pathology to rule out malignancy. Most  likely these were represent periapical granulomas, but pathology will  determine the final diagnosis.  The patient will be given appropriate pain  medication and will be  continued on clindamycin antibiotic therapy as  indicated.      Lenn Cal, D.D.S.  Electronically Signed     RFK/MEDQ  D:  05/23/2005  T:  05/25/2005  Job:  [id number]   cc:   Edythe Lynn, M.D.   Lolita Lenz, M.D.

## 2010-10-21 NOTE — Consult Note (Signed)
NAME:  DANDY, BREVIG                ACCOUNT NO.:  [id number]   MEDICAL RECORD NO.:  [id number]          PATIENT TYPE:  INP   LOCATION:  3017                         FACILITY:  [facility]   PHYSICIAN:  Lenn Cal, D.D.S.DATE OF BIRTH:  [birth year]   DATE OF CONSULTATION:  05/22/2005  DATE OF DISCHARGE:                                   CONSULTATION   DENTAL CONSULTATION   HISTORY OF PRESENT ILLNESS:  Crespin Mostoller is a 60 year old male who was  recently hospitalized for hyperglycemia and a history of uncontrolled  diabetes mellitus.  Dental consultation was requested to evaluate the  dentition as the source of infection which may be causing elevated glucose  readings.  Dental consultation, therefore, to rule out all infection which  may affect the patient's systemic health.   MEDICAL HISTORY:  1.  Diabetes mellitus, type 2.      1.  History of hyperglycemia and reason for this admission.  2.  Hypertension.  3.  Anemia -- sickle cell disease.  4.  Gastroesophageal reflux disease.  5.  Visual deficit currently being evaluated.   ALLERGIES/DRUG REACTION:  MORPHINE SULFATE.   MEDICATIONS:  1.  NovoLog insulin per sliding scale.  2.  Lotensin 20 mg daily.  3.  Norvasc 10 mg daily.  4.  Folic acid 1 mg daily.  5.  Glucotrol 10 mg daily.  6.  Metformin 1000 mg twice daily.  7.  Clindamycin 150 mg p.o. q.6 h.  8.  Lantus insulin 15 units at bedtime.  9.  Oxycodone q.6 h. as needed.   SOCIAL HISTORY:  The patient is married. Patient with a history of  occasional alcohol use.  Patient does smoke tobacco.  Patient also with a  history of cocaine use.   FAMILY HISTORY:  Noncontributory.   FUNCTIONAL ASSESSMENT:  Patient was independent for ADLs prior to this  admission.   REVIEW OF SYSTEMS:  This is reviewed from the chart and health history  assessment form for this admission.   DENTAL HISTORY/CHIEF COMPLAINT:  Dental consultation requested to rule out  dental infection  which may affect the patient's systemic health and glucose  control.   HISTORY OF PRESENT ILLNESS:  The patient was admitted with an episode of  hyperglycemia which required admission.  Dental consultation requested to  rule out dental infection which may affect the patient's systemic health and  glucose control.   The patient is currently complaining of a toothache involving the upper  right quadrant.  The patient points to the area of #1 and #2.  The patient  indicates that he currently has a history of sharp pain most recently 9/10  in intensity.  This was relieved with antibiotic therapy along with pain  medications.  The patient currently experiencing a 0/10 intensity pain.  The  patient indicates that he was last seen several years ago to have multiple  teeth extracted.  The patient did have tooth numbers 6, 7, and 8 extracted  by an oral surgeon in March 2002.  The patient denies having dentures at  this time.  The  patient is anticipated to have multiple extractions at this  time.   PHYSICAL EXAMINATION:  GENERAL:  The patient is a well-developed, well-  nourished male in no acute distress.  VITAL SIGNS:  Blood pressure is 120/74, pulse 67, respirations are 16.  Temperature is 98.2.  HEAD AND NECK EXAM:  There is no palpable lymphadenopathy.  There are no  acute TMJ symptoms.  INTRAORAL EXAM:  The patient has normal saliva.  There is no evidence of  abscess formation within the mouth.  The patient does have bilateral  mandibular lingual tori.  The patient also has supereruption which may  require additional preprosthetic surgery as indicated.  DENTITION:  Patient with multiple missing teeth.  Patient with multiple  retained roots.  PERIODONTAL:  Patient with chronic periodontitis with plaque, calculus  accumulations, gingival recession, and tooth mobility.  There is incipient-  to-moderate bone loss.  DENTAL CARIES:  There are multiple dental caries affecting the remaining   dentition.  ENDOTONIC:  The patient with a history of acute pulpitis symptoms.  The  patient has multiple areas of periapical pathology.  CROWN OR BRIDGE:  There are no crown or bridge restorations.  PROSTHODONTICS:  The patient denies the presence of dentures.  OCCLUSION:  The patient with a poor occlusal scheme secondary to multiple  missing teeth, supraeruption and drifting of the unopposed teeth into the  edentulous areas, presence of retained root segments, and lack of  replacement of the missing teeth with dental restorations.   RADIOGRAPHIC INTERPRETATION:  Panographic x-ray was taken today in the  department of radiology.  There are multiple missing teeth.  There is  superior rupture and drifting of the unopposed teeth into the edentulous  areas.  There is moderate horizontal vertical bone loss.  There are multiple  areas of periapical pathology.  There are multiple dental caries noted.  There is a poor occlusal scheme noted.   ASSESSMENTS:  1.  History of acute pulpitis symptoms with periapical pathology.  2.  History of oral neglect.  3.  Chronic periodontitis or bone loss.  4.  Gingival recession.  5.  Tooth mobility.  6.  Multiple dental caries.  7.  Bilateral mandibular lingual tori.  8.  Superior eruption of the maxillary tuberosities.  9.  Multiple missing teeth.  10. Multiple retained root segments.  11. No history of dentures.  12. Risk for dental infection which may be affecting the patient's glucose      control.   PLAN/RECOMMENDATIONS:  1.  I discussed the risks, benefits, and complications of the various      options in relationship to the patient's medical and dental conditions      and glucose control.  We discussed the various treatment options to      include total and subtotal extractions with alveoplasty, bilateral      mandibular tori reductions, bilateral maxillary tuberosity reductions,      preprosthetic surgery as indicated, periodontal therapy,  dental     restorations, root canal therapy, crowna nd bridge therapy, implant      therapy, and replacement of missing teeth is indicated.  The patient      currently wishes to proceed with extraction of all remaining teeth with      the exception of teeth numbers 22, 23, 24, 25, 26, and 27.  The patient      also agrees to alveoloplasty as indicated in the operating room with      general anesthesia. After  adequate healing, the patient will followup      with the general dentist of his choice for fabrication of an upper      complete denture and a lower cast partial denture.  2.  Will discuss finding with medical team and plan for OR on 12/19/06at      10:00 am.   DESCRIPTION OF FINDINGS:      Lenn Cal, D.D.S.  Electronically Signed     RFK/MEDQ  D:  05/22/2005  T:  05/24/2005  Job:  [id number]   cc:   Lolita Lenz, M.D.   Edythe Lynn, M.D.   [phone] Dental Medicine

## 2010-10-21 NOTE — H&P (Signed)
NAME:  [name], [name]                ACCOUNT NO.:  [id number]   MEDICAL RECORD NO.:  [id number]          PATIENT TYPE:  OBV   LOCATION:  0103                         FACILITY:  [facility]   PHYSICIAN:  Annita Brod, M.D.DATE OF BIRTH:  02/26/1951   DATE OF ADMISSION:  05/17/2005  DATE OF DISCHARGE:                                HISTORY & PHYSICAL   ATTENDING PHYSICIAN:  Dr. Jerelene Redden   PRIMARY CARE PHYSICIAN:  Dr. Ricke Hey   CHIEF COMPLAINT:  Weakness and dizziness.   The patient is a 60 year old African-American male with past medical history  of diabetes and high blood pressure who normally follows with Dr. Ricke Hey. He says that he has not taken any of his medications for almost 3  months now because recently when he ran out after his initial prescriptions,  he did not know he had any refills and did not follow up with his doctor to  inquire about taking more medicine. He has been staying at home, no  complaints, but noted that he had been feeling more and more weak over the  last several days. Finally could not take any more, came to the emergency  room, and was found to have a blood sugar of 952. He had no anion gap,  however. This was thought to be nonketotic hyperglycemia. The rest of his  labs of note noted a sodium of 120, BUN 25, creatinine of 1.3, a white count  of 13.2 with no shift, and a UA which only showed greater than 1000 of  glucose. The patient was started on IV fluids as well as insulin  Glucommander. This was started approximately 1 a.m. Approximately 3 hours  later his sugars were down into the 300s. He says he feels a lot better but  still feels weak.  He denies any other complaints such as headaches, vision  changes, dysphagia, chest pain, palpitations, shortness of breath, wheeze,  cough, abdominal pain, hematuria, dysuria, constipation, diarrhea. The  patient does state that he feels like his mouth is dry. His review of  systems is  otherwise negative.   PAST MEDICAL HISTORY:  1.  Hypertension.  2.  Medical noncompliance.  3.  Diabetes mellitus.  4.  GERD.   MEDICATIONS:  He is on:  1.  Nexium 40 p.o. daily.  2.  Glucophage 500 mg p.o. b.i.d.  3.  Lotrel 10/20 p.o. daily.  4.  Folic acid 1 p.o. daily.  5.  Glipizide 10 p.o. daily.   ALLERGIES:  PENICILLIN.   SOCIAL HISTORY:  He denies any current tobacco or alcohol use. He does admit  to drug use in the past but none currently.   FAMILY HISTORY:  Noncontributory.   PHYSICAL EXAMINATION:  VITAL SIGNS ON ADMISSION:  Temperature 98.4, heart  rate 79, blood pressure 136/94, respirations 22, O2 saturation 976% on room  air.  GENERAL:  The patient is alert and oriented x3, no apparent distress.  HEENT:  Normocephalic, atraumatic. His mucous membranes are slightly dry. He  has no carotid bruits.  HEART:  Regular rate and rhythm, S1,  S2.  LUNGS:  Clear to auscultation bilaterally.  ABDOMEN:  Soft, nontender, nondistended, positive bowel sounds.  EXTREMITIES:  No clubbing, cyanosis, or edema.   LABORATORY WORK:  Sodium 120, potassium 5.4, chloride 88, bicarb 75, BUN 25,  creatinine 1.3, glucose 952, glucose now down to 300, calcium 9.8. White  count 13.2, H&H 11.9 and 35.4, MCV of 71, platelet count 296, no shift. UA  noted only for greater than 1000 of glucose.   ASSESSMENT AND PLAN:  1.  Nonketotic hyperglycemia. This is secondary to medical noncompliance.      Continue his home Glucommander until his sugars fall below 200, then      will resume his p.o. medications plus diet. After that, he can likely go      home.  2.  Hyponatremia. This is actually likely pseudohyponatremia secondary to      high blood sugars and not an actual low sodium.  3.  Renal insufficiency. This is likely secondary to dehydration brought on      by hyperglycemia. It should resolve with correction of sugars and      addition of IV fluids.  4.  Slight leukocytosis. Likely stress  secondary to hyperglycemia. Will      resolve once his numbers come down. Will follow up a CBC post.  5.  Hypertension. Resume Lotrel once he is p.o.  6.  Gastroesophageal reflux disease. Resume Nexium once he is p.o.   Please also note, the ER tried multiple times to contact the patient's PCP,  Dr. Alyson Ingles, but were unable to contact him. Therefore, the patient was  sent to the [facility].      Annita Brod, M.D.  Electronically Signed     SKK/MEDQ  D:  05/18/2005  T:  05/18/2005  Job:  [id number]   cc:   Rushie Nyhan, M.D.  Fax: [phone]

## 2010-10-21 NOTE — H&P (Signed)
[name], [name]                ACCOUNT NO.:  [id number]   MEDICAL RECORD NO.:  [id number]          PATIENT TYPE:  INP   LOCATION:  1418                         FACILITY:  [facility]   PHYSICIAN:  Ashby Dawes. Polite, M.D. DATE OF BIRTH:  12-04-1950   DATE OF ADMISSION:  08/21/2005  DATE OF DISCHARGE:                                HISTORY & PHYSICAL   CHIEF COMPLAINT:  Chest pain and arm pain per patient, secondary to sickle  cell.   HISTORY OF PRESENT ILLNESS:  A 60 year old male with known history of  diabetes, hypertension, sickle cell disease, presents to the ED with  complaint of pain in his arms and his chest which is consistent with prior  sickle cell pain. The patient had stated that he does not have any of pain  medicine; therefore, he presented to the ED for evaluation. The patient was  hemodynamically stable, afebrile, had screening labs. BMET within normal  limits. Point-of-care enzymes within normal limits. CBC showing a white  count of 20,000; hemoglobin 10.6; MCV of 74; platelets 318; neutrophil count  82%; retic count of 4%. Positive drug screen for cocaine. The patient had an  EKG that is not available at the time of my review in the ED. However, per  the T sheet shows that he had inverted T waves anterolateral. The patient  also had a chest x-ray with no acute disease. [facility] called for  further evaluation. At the time of my evaluation, the patient alert and  oriented x3, no apparent distress. Still complaining of arms feeling heavy  and some pain across his chest. The patient states this is typical of his  sickle cell disease. He denies any fever/chills, no nausea, no vomiting, no  diarrhea, no constipation. When questioned about the positive drug screen,  the patient states that he did use a little bit of cocaine. Admission is  deemed necessary for further evaluation and treatment of chest pain, rule  out sickle cell disease versus acute coronary  syndrome.   PAST MEDICAL HISTORY:  As stated above, significant for:  1.  Diabetes.  2.  Hypertension.  3.  Sickle cell disease.  4.  The patient states he has a history of colon CA status post colectomy 6      or 7 years ago.   MEDICATIONS ON ADMISSION:  Include Nexium, Glucophage, Lotrel, folic acid,  glipizide.   SOCIAL HISTORY:  Negative for tobacco, positive for alcohol. Positive for  drug screen - cocaine.   ALLERGIES:  No known drug allergies.   FAMILY HISTORY:  Mother and father are deceased. Both had diabetes. Father  had throat CA. Brother and sister with diabetes and hypertension.   REVIEW OF SYSTEMS:  As stated in the HPI.   PHYSICAL EXAMINATION:  GENERAL:  The patient is alert and oriented x3 in no  apparent distress.  VITAL SIGNS:  Temperature 98, blood pressure 136/78, pulse 90, respiratory  rate of 20.  HEENT:  Pale sclerae. No oral lesions. Please note, the patient is  edentulous.  NECK:  Supple, no adenopathy.  CHEST:  Clear to auscultation.  CARDIOVASCULAR:  Regular, no S3.  ABDOMEN:  Soft, nontender.  EXTREMITIES:  No clubbing, cyanosis, or edema.  NEUROLOGIC:  Nonfocal.   DATA:  As stated above, BMET within normal limits. Point-of-care enzymes  within normal limits. CBC:  White count 20.9, hemoglobin 10.6, MCV 74.1,  platelets 318, neutrophil 82%. Chest x-ray:  No apparent disease. Urine drug  screen positive for cocaine.   ASSESSMENT:  1.  Sickle cell disease with pain crisis associated with arm pain and chest      pain, must rule out coronary disease especially in light of the      patient's diabetes, hypertension, and cocaine use.  2.  Diabetes.  3.  Hypertension.  4.  Positive urine drug screen for cocaine.  5.  Reported history of colon cancer status post colectomy without signs of      recurrence.  6.  Leukocytosis on CBC. Please note, no obvious signs of infection on      examination.   Recommend the patient be admitted to a telemetry  floor bed. The patient will  be given analgesics for pain. Because of a positive drug screen for cocaine,  history diabetes and hypertension, will also order EKG and serial cardiac  enzymes to rule out cardiac cause of pain. Will resume the patient's other  outpatient medications and make further recommendations as deemed necessary.      Ashby Dawes. Polite, M.D.  Electronically Signed     RDP/MEDQ  D:  08/20/2005  T:  08/21/2005  Job:  [id number]

## 2010-10-21 NOTE — Discharge Summary (Signed)
NAMESHEHZAD, TELL                ACCOUNT NO.:  [id number]   MEDICAL RECORD NO.:  [id number]          PATIENT TYPE:  INP   LOCATION:  3017                         FACILITY:  [facility]   PHYSICIAN:  Rexene Alberts, M.D.    DATE OF BIRTH:  Apr 01, 1951   DATE OF ADMISSION:  05/17/2005  DATE OF DISCHARGE:  05/25/2005                                 DISCHARGE SUMMARY   DISCHARGE DIAGNOSES:  1.  Hyperosmolar, nonketotic, hyperglycemia. Admission glucose was 952.  2.  Type 2 diabetes mellitus, uncontrolled secondary to noncompliance.  3.  Chronic periodontitis, accretions, bilateral mandibular lingual tory.      Status post multiple extractions of tooth numbers one, four, five, 9,      11, 10, 14, 15, 20, 21, and 28.  Status post four quadrants of      alveoloplasty, status post bilateral mandibular lingual Tory reductions,      submission of the periapical cyst at the apices of tooth 21 and 28. Per      Teena Dunk, DDS.   1.  Hypertension.  2.  Hyponatremia secondary to hyperglycemia.  3.  Anemia secondary to sickle cell disease.  4.  Gastroesophageal reflux disease.  5.  History of noncompliance.   DISCHARGE MEDICATIONS:  1.  Metformin 1000 mg b.i.d.  2.  Glipizide ER 10 mg daily.  3.  Lotrel 10/20 mg daily.  4.  Folic acid 1 mg daily.  5.  Multivitamin with iron one pill daily.  6.  Nexium 40 mg one pill daily.  7.  Clindamycin 300 mg t.i.d. for seven more days.  8.  Percocet 5 mg one tablet every four hours as needed for pain.  9.  Ibuprofen 200 mg two tablets once or twice daily as needed.  10. Pureed/chopped foods/diabetic diet as tolerated.  11. Continue salt water rinses every 2-3 hours.   DISCHARGE DISPOSITION:  The patient was discharged home in improved and  stable condition.  He was advised to follow up with Dr. Enrique Sack as  scheduled in January2007.  He was advised to follow up with Dr. Alyson Ingles in  one week.   CONSULTATIONS:  Teena Dunk, D.D.S..   PROCEDURE  PERFORMED:  1.  Orthopantogram on December16,2006.  The results revealed stable missing      teeth, large cavity in tooth one and periapical lucencies.  2.  Status post multiple tooth extractions, etc. See the results above.   HISTORY OF PRESENT ILLNESS:  The patient is a 60 year old man with a past  medical history significant for type 2 diabetes mellitus and hypertension  who presented to the emergency department with a chief complaint of weakness  and dizziness.  During the initial evaluation in the emergency department  the patient was found to have a blood sugar of 952.  He was therefore  admitted for further evaluation and management.   HOSPITAL COURSE:  Problem 1.  Hyperosmolar, nonketotic hyperglycemia with a  history of type 2 diabetes mellitus.  As stated above, the patient's venous  blood sugar was 952 on admission.  His bicarbonate level was 25 and  his  anion gap was within normal limits.  Therefore the patient was not in DKA.  The patient was started on aggressive IV fluids with normal saline.  He was  started on an insulin drip via the Glucomander.  His capillary blood sugars  were monitored accordingly.  The Glucomander was eventually discontinued and  the patient was started on Lantus insulin and a sliding scale insulin  regimen with NovoLog q.4h.  Glucophage and Glipizide were eventually  restarted.  The patient had been treated chronically with Glucophage 500 mg  b.i.d. and Glipizide 10 mg daily as an outpatient.  However, he had not been  taking either medication for approximately three months because his initial  prescriptions ran out.  The patient did not follow up with his primary care  physician, Dr. Alyson Ingles.  The patient felt that he was doing okay and saw no  need to refill the prescriptions.  The Glucophage was titrated up to 1000 mg  b.i.d. and the Glipizide was titrated up to b.i.d. during hospital course.  The Lantus and the sliding scale insulin regimen were  decreased in  anticipation that the patient would not go home on insulin therapy.  Eventually the patient was discharged home on Glipizide ER 10 mg daily and  Glucophage 1000 mg b.i.d..  Outpatient insulin therapy was not prescribed  given the patient's history of noncompliance.  However, during hospital  course, he was provided with an update on diabetes education and a given  instructions on a modified carbohydrate diet.  The patient was strongly  encouraged to check his capillary blood sugars at least twice daily.  Prescriptions were given for a Glucometer, lancets and Glucometer strips.  The patient was also advised to follow up with Dr. Alyson Ingles in one week.  The patient will need an ophthalmology evaluation as he did have  intermittent blurred vision during hospital course.  The arrangements for an  ophthalmology evaluation will be deferred to the patient and the patient's  primary care physician, Dr. Alyson Ingles.  Of note, a hemoglobin A1C was ordered  during hospital course, however, it could not be calculated secondary to the  patient's abnormal hemoglobin (history of sickle cell disease).   Problem 2. Chronic periodontitis/multiple caries.  The patient complained of  painful gums and teeth during hospital course.  On exam, the patient had  multiple missing teeth with obvious cavities and caries.  Dr. Enrique Sack was  consulted for further evaluation.  Per his assessment the patient needed  multiple tooth extractions and probable additional oral surgery.  Per Dr.  Ritta Slot assessment, the patient had acute pulpitis with periapical  pathology, chronic periodontitis or bone loss, gingival recession, and  multiple dental caries. The patient underwent surgical extractions of  multiple teeth and other procedures as discussed above, per Dr. Enrique Sack on  December20,2006.  He tolerated the procedure well and there were no complications.  The patient did however have some swelling of his face  and  within his oral cavity which were expected following the procedures.  The  patient's pain was treated with oxycodone and as needed ibuprofen.  The  patient had been started on clindamycin by mouth several days prior to Dr.  Ritta Slot evaluation.  Following the procedures, he was restarted on  clindamycin, however, intravenously.  The patient completed at least six  days of antibiotic therapy with Cleocin during hospital course.  He will be  discharged to home on Cleocin (Clindamycin) 300 mg t.i.d. for seven more  days.  The patient was advised to salt water gargle and rinse at least every  2-3 hours per Dr. Enrique Sack.  The patient was advised to follow up with Dr.  Enrique Sack per his schedule in January2007.'   Problem 3.  Hypertension.  The patient was restarted on Lotrel during  hospital course.  His blood pressures were well-controlled during hospital  course.   Problem 4. Hyponatremia.  The patient's serum sodium was 120 on admission.  The hyponatremia was felt to be secondary to hyperglycemia.  Following  volume repletion with normal saline his serum sodium improved to 133 prior  to hospital discharge.   Problem 5.  Microcytic anemia.  The patient gives a history of sickle cell  anemia.  He denies having any recent crisis.  Iron studies were ordered  during hospital course and revealed a ferritin of 1096, vitamin B12 of 735,  total iron 47, and RBC folate of 584.  The patient's TSH was also assessed  and found to be within normal limits at 1.831.  The patient was continued on  folic acid 1 mg daily and multivitamin with iron once daily.      Rexene Alberts, M.D.  Electronically Signed     DF/MEDQ  D:  05/28/2005  T:  05/30/2005  Job:  [id number]   cc:   Rushie Nyhan, M.D.  Fax: [phone]   Lenn Cal, D.D.S.  Fax: [phone]

## 2010-10-21 NOTE — Discharge Summary (Signed)
NAME:  [name], [name]                ACCOUNT NO.:  [id number]   MEDICAL RECORD NO.:  [id number]          PATIENT TYPE:  INP   LOCATION:  1303                         FACILITY:  [facility]   PHYSICIAN:  Melissa L. Lovena Le, MD  DATE OF BIRTH:  Dec 17, 1950   DATE OF ADMISSION:  08/21/2005  DATE OF DISCHARGE:  09/01/2005                                 DISCHARGE SUMMARY   DISCHARGE DIAGNOSES:  1.  Chest pain secondary to sickle cell crisis. The patient usually      experiences left shoulder pain with a sickle cell crisis. He was      admitted to the hospital and ruled out using cardiac markers which      showed no evidence for myocardial infarction. He therefore was treated      with hydration, exchanged transfusion, and pain medication for his      sickle cell crisis. Of note the patient did test positive for cocaine.  2.  Cocaine abuse. The patient was given the ADS number for follow-up abuse      counseling.  3.  Heme-positive stools with a history of colon cancer. The patient's deep      venous thrombosis prophylaxis was discontinued and an attempt was made      to determine who had previously evaluated him for his colon cancer. We      were unable to locate records since this was greater 7 years. The      patient, therefore, has requested to follow up as an outpatient with a      primary care physician and have a full GI workup.  4.  Diabetes, which was controlled during the course of hospital stay and in      fact he was having periods of hypoglycemia and therefore his Glucotrol      was decreased.  5.  Hypertension. This was well controlled the course of the hospital stay.   MEDICATIONS AT THE TIME OF DISCHARGE:  1.  Thiamine 100 mg once daily.  2.  Multivitamin once daily.  3.  Aspirin 81 mg once daily.  4.  Metformin 500 mg b.i.d.  5.  Lotrel 10/20 once daily.  6.  Folic acid 1 mg once daily.  7.  Glucotrol 5 mg once daily.  8.  Ultram 50 mg q.6h. p.r.n.   A follow-up appointment  was made with Dr. Lutricia Feil on April 9th.  This  was made through the [facility]. The patient was also given the phone  number for [facility] diabetic suppliers in order to provide him with  his lancets and diabetic care needs.   HISTORY OF PRESENT ILLNESS:  The patient is a 60 year old African American  male with a known history of sickle cell disease, hypertension, and diabetes  who presented to the emergency room on March 19th with a chief complaint of  pain in his arms and his chest. This is consistent with prior presentations  of sickle cell crisis. The patient was admitted to telemetry, he was ruled  out for myocardial infarction using point of care enzymes. He did  test  positive for cocaine in his urine. Chest x-ray showed no acute disease.   The patient was admitted for IV hydration and treatment with IV Dilaudid. He  initially responded favorably to this therapy. In light of the fact that he  had negative cardiac markers and responded to the treatment for his sickle  cell disease, no further cardiac workup was undertaken. Ultimately the  patient did develop anemia related to his symptomatology and underwent  exchange transfusion times one unit of packed red blood cells. The patient  had no complications related to this.   The patient required PICC placement because of poor IV access which also  facilitated his exchange transfusion. Of note the patient was placed on DVT  prophylaxis and did develop heme-positive stools during the course of the  hospital stay and his Lovenox was therefore discontinued. An attempt was  made to identify the patient's outpatient past GI physicians. The patient  did not recall who cared for him during his past history of GI cancer which  was resected approximately 6-7 years prior to this admission. We are unable  to obtain those records and therefore because the patient was responding  favorably to therapy, unable to continue his treatment  at home, we elected  to request that the patient have follow-up GI outpatient workup.   Of note the patient did have decreased blood sugars during the course of the  hospital stay. His Glucotrol was therefore decreased to 5 mg daily.  Arrangements were made for a new primary care physician for this patient as  he had previously discontinued contact with his previous physician.   At the time of discharge the patient was hemodynamically stable. He stated  that his sickle cell pain was manageable on the Ultram prescribed. He was  therefore discontinued to home to follow as an outpatient with Dr. Brigitte Pulse.   Follow-up issues as stated would be for a complete GI workup and continued  chronic management of his hypertension and his diabetes.   At the time of discharge the patient was advised to discontinue the use of  cocaine.   At the time of discharge the patient was hemodynamically stable and his  condition was stable. He was dispositioned home.      Melissa L. Lovena Le, MD  Electronically Signed     MLT/MEDQ  D:  09/14/2005  T:  09/14/2005  Job:  [id number]   cc:   Janalyn Rouse, M.D.  Fax: [phone]

## 2010-10-21 NOTE — Discharge Summary (Signed)
[place] [facility]. [facility]  Patient:    [name], [name]                         MRN: [id number] Adm. Date:  [id number] Disc. Date: [id number] Attending:  Othella Boyer                           Discharge Summary  ADMITTING DIAGNOSES: 1. Sickle cell vaso-occlusive crisis. 2. Noninsulin-dependent diabetes mellitus. 3. Hypertension. 4. History of migraine headaches.  DISCHARGE DIAGNOSES: 1. Sickle cell vaso-occlusive crisis. 2. Dental caries, status post extractions of numbers 6, 7 and 8. 3. Substance abuse. 4. Hypertension. 5. Noninsulin-dependent diabetes mellitus.  DISCHARGE CONDITION:  Stable.  DISCHARGE DISPOSITION:  Follow up in the office in approximately one to two weeks.  DISCHARGE MEDICINES: 1. Ibuprofen 800 mg t.i.d. 2. Darvocet N 100 mg q.i.d. p.r.n. pain as needed. 3. Folic acid 1 mg daily. 4. Glucophage XR 5 mg b.i.d. 5. Glucotrol XL 10 mg each morning. 6. Lotensin 20 mg daily.  DIET:  No concentrated sugars, no added salt, no alcohol or drugs.  HISTORY:  This patient is a 60 year old married black male who has known sickle cell disease, hypertension and non-insulin diabetes mellitus.  He was brought to the emergency room by wife and seen by Dr. Prudence Davidson, emergency department physician, and given Dilaudid 4 mg IV, nasal cannula O2 at 2 liters nasal cannula flow and normal saline.  Chest x-ray revealed no active disease and EKG revealed normal sinus rhythm.  He was thus not improved with emergency treatment efforts and thus referred for inpatient care.  PAST MEDICAL HISTORY, FAMILY HISTORY, PERSONAL HISTORY, SOCIAL HISTORY, AND REVIEW OF SYSTEMS:  Outline in admission history and physical.  PHYSICAL EXAMINATION:  VITAL SIGNS:  Reveal blood pressure 125/96, pulse 60, respirations 18 and temperature 97.5.  GENERAL APPEARANCE:  Patient is lying supine on a stretcher in emergency room 9.  HEENT:  Reveal PERRLA.  He has mild scleral  icterus.  Mouth exam reveals normal mucosa and dentition.  NECK:  Supple.  No jugular venous distention, bruits, nodes or thyromegaly.  CHEST:  Clear.  HEART:  Regular rhythm and rate without murmur.  ABDOMEN:  Soft and flat.  GENITALIA:  Examination reveals bilaterally descended testes.  EXTREMITIES:  Reveal IV in right arm with no edema or deformity.  NEUROLOGICAL EXAMINATION:  Appropriate.  LABORATORIES:  EKG revealed normal sinus rhythm with left ventricular hypertrophy with repolarization abnormality.  Chest x-ray revealed no active disease.  CBC initially revealed  white count of 36.3 thousand with hematocrit of 30.1 and platelets 152,000.  Repeat revealed 14,400 white count with hemoglobin 7.7 and hematocrit of 24.4 with platelets 317,000.  Differential revealed 79% neutrophils, 12 lymphocytes, 5 monocytes, 1 eosinophil, and 1 basophil. Lymphocyte morphology revealed polychromasia, target cells, Howell-Jolly bodies, sickle cells and red nuclear red blood cells.  Retic percentage was 4.3.  Electrolytes were normal, except for an initial potassium of 3.4, which corrected to normal and glucose was 71 with ALP of 134 and total bilirubin 1.6. which increased 4.4 and then reduced back down to 2.0.  Drug screen was positive for cocaine and opiates.  Urinalysis was normal.  Blood cultures had no growth times two.  Urine culture had 3,000 colonies of insignificant growth.  HOSPITAL COURSE:  The patient was treated with IV fluids of normal saline at 100 cc/hour and given Dilaudid 2-4 mg IV  q2h with Phenergan 25 mg IM or p.o. q4h.  He was continued on his other home medicines for diabetes and hypertension, and folate.  He gradually improved and had dental pain.  He was seen in consultation by Dr. Loyal Gambler who was on call for Dr. Conan Bowens, the dental medicine physician.  He was also given Epogen 40,000 units subcu on March 20.  He was transitioned to oral medicines and had dental  extractions. He is thus much improved and stable for outpatient management. DD:  09/27/00 TD:  09/28/00 Job: [id number]

## 2010-10-21 NOTE — Discharge Summary (Signed)
NAMESEBASTIEN, [name]                            ACCOUNT NO.:  [id number]   MEDICAL RECORD NO.:  [id number]                   PATIENT TYPE:  INP   LOCATION:  [id number]                                 FACILITY:  [facility]   PHYSICIAN:  Rushie Nyhan, M.D.           DATE OF BIRTH:  Nov 20, 1950   DATE OF ADMISSION:  10/27/2002  DATE OF DISCHARGE:  11/11/2002                                 DISCHARGE SUMMARY   ADMISSION DIAGNOSES:  1. SS vaso-occlusive crisis.  2. Hypertension.  3. Noninsulin-dependent diabetes mellitus.   DISCHARGE DIAGNOSES:  1. SS vaso-occlusive crisis.  2. Hypertension.  3. Noninsulin-dependent diabetes mellitus.  4. Methicillin sensitive Staphylococcus aureus, cellulitis of right arm PICC     site.   DISCHARGE CONDITION:  Stable.   DISPOSITION:  Follow in my office in approximately 1 to 2 weeks.   DISCHARGE MEDICATIONS:  1. Methadone 2 mg t.i.d. or q.i.d. p.r.n. pain.  2. Augmentin 875 mg b.i.d. for ten days.  3. Glucotrol XL 10 mg a.m.  4. Glucophage XR 500 mg p.m. daily.  5. Lotensin 400 mg daily.   The patient will continue with PICC line site with care through the home  health agency with saline flushes daily per patient.   HISTORY:  This patient is a 60 year old married black male who presented to  the emergency room per family transportation for evaluation and treatment of  pain unrelieved by home medicine.  He has known SS sickle-cell disease and  had also hypertension and non-insulin diabetes mellitus.  He is currently  employed and has had numerous admissions to hospital as well as six medical  physicians.  He was treated initially by emergency department physician, Dr.  Nat Christen, and subsequently referred to me for further inpatient care as he  did not get significant relief in the emergency room.  The patient was  initially admitted to 8 hour stay but did not improve and patient was  admitted.   HOME MEDICINES:  1. Lotensin 20 mg daily.  2.  Darvocet-N 100 mg q.i.d. p.r.n. pain.  3. Ibuprofen 800 mg t.i.d.  4. Folic acid one mg a day.  5. Glucophage XR 500 mg q.p.m.  6. Glucotrol XL 10 mg daily.   The patient's last admission was August 07, 2002 thru August 27, 2002 for  sickle cell crisis.  Only operation is for colon cancer and he is not on any  kind of therapy for this.   ALLERGIES:  MORPHINE SULFATE, of which he has intolerance basically.   Other past medical history, family history, social history, personal  history, are in admission history and physical.   PHYSICAL EXAMINATION:  VITAL SIGNS:  Revealed a blood pressure of 152/72,  pulse 95, respirations 20, temperature 97.7 orally.  O2 sat is 95% on room  air.  The patient was an alert and oriented black male who is lying supine  on stretcher  and appears in moderate discomfort.  HEENT:  PERRLA.  EOMs are intact.  He had mild scleral icterus.  NECK:  Supple.  CHEST:  Clear to auscultation.  HEART:  Regular rhythm and rate without murmur.  ABDOMEN:  Soft and flat.  Bowel sounds are normal.  GENITOURINARY:  Examination revealed bilaterally descended testes,  uncircumcised penis.  RECTAL:  Deferred.  EXTREMITIES:  Reveal normal range of motion with infusion of D5 one-half  normal saline.   LABORATORIES:  Initial chest x-ray reveals slight cardiac enlargement  without evidence of frank congestive heart failure or pneumonia.  He had  fluoroscopic insertion of PICC line on Oct 27, 2002.  Repeat chest x-ray on  November 07, 2002, revealed findings of mild congestive heart failure and small  effusions.  Chest x-ray on the day of discharge is pending.  The patient had  fluoroscopic guidance of removal of right arm PICC line which became  infected and reinsertion of it in the left arm.  Spiral CT of chest was  negative for pulmonary embolus, but did have bibasilar segmental atelectasis  and question of acute fissure process on the left lower lung on Oct 30, 2002.  Initial CBC  reveals white count of 24,300 with repeat on Nov 03, 2002  being 25.6.  Hemoglobin initially was 10.1 with drop to 6.3.  Hematocrit was  30 with drop to 18.9.  Red blood cell morphology reveals sickle cells,  polychromasia, and schistocytes, and target cells.  Chemistries were  basically normal except for glucose elevation and initial total bilirubin of  1.8.  Drug screen was opiate positive only.  Urinalysis was basically normal  except for glucose 100mg /percent.  Blood cultures were negative from  peripheral and PICC line on Nov 03, 2002.  Wound culture grew Staphylococcus  aureus, Methicillin sensitive.   HOSPITAL COURSE:  The patient was continued on home medicines and given  penicillin as well as NovoLog.  He was given Dilaudid and Phenergan 25 mg  I.V. in addition q. 2h p.r.n. pain.  He was continued on Lotensin and this  was subsequently increased to 40 mg a day with Maxzide 25 mg being added  p.o. for control of blood pressure.  He had accumulation of approximately 15  pounds from admission weight and developed some congestive chest symptom,  thus he was given Lasix I.V. and then 20 mg p.o. daily with reduction back  down 15+ pounds.  He was given Tequin 4 mg daily on November 10, 2002 after  Unasyn which was given for his PICC line initial infection.  The patient did  have some difficulty with hypoxemia and required 3-4 L of nasal cannula O2;  however, still he did not show evidence of pulmonary emboli.  This was given  from Nov 03, 2002 at 1.5 grams I.V. q. 6h until November 10, 2002.  The patient  is now improved and has been transitioned to oral medicines and is rather  comfortable.  He is thus being discharged for outpatient management.                                               Rushie Nyhan, M.D.    WWM/MEDQ  D:  11/11/2002  T:  11/11/2002  Job:  [id number]

## 2011-02-28 LAB — CBC
HCT: 28.8 — ABNORMAL LOW
HCT: 30.1 — ABNORMAL LOW
HCT: 31.2 — ABNORMAL LOW
Hemoglobin: 10 — ABNORMAL LOW
Hemoglobin: 9.9 — ABNORMAL LOW
MCHC: 32.1
MCV: 73.3 — ABNORMAL LOW
MCV: 74.8 — ABNORMAL LOW
MCV: 74.8 — ABNORMAL LOW
Platelets: 217
RBC: 4.17 — ABNORMAL LOW
RDW: 16.9 — ABNORMAL HIGH
RDW: 17.7 — ABNORMAL HIGH
WBC: 15.4 — ABNORMAL HIGH
WBC: 18.1 — ABNORMAL HIGH

## 2011-02-28 LAB — COMPREHENSIVE METABOLIC PANEL
ALT: 21
Albumin: 4
Alkaline Phosphatase: 86
Calcium: 9.6
Glucose, Bld: 174 — ABNORMAL HIGH
Potassium: 3.9
Sodium: 135
Total Protein: 7.6

## 2011-02-28 LAB — URINALYSIS, ROUTINE W REFLEX MICROSCOPIC
Bilirubin Urine: NEGATIVE
Hgb urine dipstick: NEGATIVE
Ketones, ur: NEGATIVE
Nitrite: NEGATIVE
Protein, ur: NEGATIVE
Urobilinogen, UA: 2 — ABNORMAL HIGH

## 2011-02-28 LAB — IRON AND TIBC
Saturation Ratios: 24
UIBC: 231

## 2011-02-28 LAB — CK TOTAL AND CKMB (NOT AT ARMC)
CK, MB: 8.1 — ABNORMAL HIGH
Relative Index: 2.7 — ABNORMAL HIGH
Total CK: 296 — ABNORMAL HIGH

## 2011-02-28 LAB — POCT CARDIAC MARKERS
CKMB, poc: 3.7
Myoglobin, poc: 88.9
Troponin i, poc: <0.05

## 2011-02-28 LAB — BASIC METABOLIC PANEL
BUN: 6
Chloride: 102
Chloride: 102
Creatinine, Ser: 1.03
GFR calc non Af Amer: >60
Glucose, Bld: 210 — ABNORMAL HIGH
Potassium: 3.8
Sodium: 136

## 2011-02-28 LAB — CARDIAC PANEL(CRET KIN+CKTOT+MB+TROPI)
CK, MB: 7.8 — ABNORMAL HIGH
CK, MB: 8.3 — ABNORMAL HIGH
Relative Index: 2.7 — ABNORMAL HIGH
Troponin I: 0.01

## 2011-02-28 LAB — RETICULOCYTES
RBC.: 4.53
Retic Count, Absolute: 283.8 — ABNORMAL HIGH

## 2011-02-28 LAB — DIFFERENTIAL
Basophils Absolute: 0.1
Basophils Relative: 1
Eosinophils Relative: 2
Monocytes Absolute: 1.3 — ABNORMAL HIGH
Monocytes Relative: 6

## 2011-02-28 LAB — RAPID URINE DRUG SCREEN, HOSP PERFORMED
Amphetamines: NOT DETECTED
Barbiturates: NOT DETECTED
Tetrahydrocannabinol: NOT DETECTED

## 2011-02-28 LAB — HEMOGLOBINOPATHY EVALUATION
Hemoglobin Other: 0 (ref 0.0–0.0)
Hgb A: 0 % — ABNORMAL LOW
Hgb F Quant: 8.2 — ABNORMAL HIGH (ref 0.0–2.0)

## 2011-02-28 LAB — POCT I-STAT, CHEM 8
BUN: 16
Calcium, Ion: 1.22
Chloride: 102
HCT: 35 — ABNORMAL LOW
Sodium: 138
TCO2: 28

## 2011-02-28 LAB — HEMOGLOBIN A1C
Hgb A1c MFr Bld: 11.7 — ABNORMAL HIGH
Mean Plasma Glucose: 339

## 2011-02-28 LAB — VITAMIN B12: Vitamin B-12: 786 (ref 211–911)

## 2011-02-28 LAB — FOLATE: Folate: >20

## 2011-02-28 LAB — LIPID PANEL
Cholesterol: 128
HDL: 43
LDL Cholesterol: 73
Triglycerides: 61

## 2011-02-28 LAB — TROPONIN I: Troponin I: 0.01

## 2011-02-28 LAB — FERRITIN: Ferritin: 602 — ABNORMAL HIGH (ref 22–322)

## 2011-02-28 LAB — PROTIME-INR: INR: 1

## 2011-02-28 LAB — BILIRUBIN, FRACTIONATED(TOT/DIR/INDIR)
Bilirubin, Direct: 0.4 — ABNORMAL HIGH
Indirect Bilirubin: 2.4 — ABNORMAL HIGH

## 2011-02-28 LAB — APTT: aPTT: 25

## 2011-02-28 LAB — HAPTOGLOBIN: Haptoglobin: 37

## 2011-03-16 LAB — CBC
HCT: 30.5 — ABNORMAL LOW
Platelets: 305
RDW: 16.9 — ABNORMAL HIGH
WBC: 12.8 — ABNORMAL HIGH

## 2011-03-16 LAB — DIFFERENTIAL
Basophils Absolute: 0
Eosinophils Relative: 2
Lymphocytes Relative: 14
Lymphs Abs: 1.8
Neutro Abs: 9.8 — ABNORMAL HIGH
Neutrophils Relative %: 77

## 2011-03-16 LAB — I-STAT 8, (EC8 V) (CONVERTED LAB)
BUN: 9
Chloride: 108
Glucose, Bld: 144 — ABNORMAL HIGH
Potassium: 4.1
Sodium: 141
TCO2: 24
pH, Ven: 7.455 — ABNORMAL HIGH

## 2011-03-16 LAB — URINALYSIS, ROUTINE W REFLEX MICROSCOPIC
Glucose, UA: NEGATIVE
Ketones, ur: NEGATIVE
Nitrite: NEGATIVE
Protein, ur: NEGATIVE
pH: 5.5

## 2011-03-16 LAB — RETICULOCYTES
RBC.: 4.02 — ABNORMAL LOW
Retic Count, Absolute: 257.3 — ABNORMAL HIGH
Retic Ct Pct: 6.4 — ABNORMAL HIGH

## 2011-03-16 LAB — POCT CARDIAC MARKERS
Myoglobin, poc: 74.8
Operator id: 285491

## 2011-03-16 LAB — POCT I-STAT CREATININE: Operator id: 285491

## 2011-05-15 ENCOUNTER — Emergency Department (HOSPITAL_COMMUNITY)
Admission: EM | Admit: 2011-05-15 | Discharge: 2011-05-16 | Disposition: A | Discharge status: Home / Self Care | Payer: Medicare Other | Attending: Emergency Medicine | Admitting: Emergency Medicine

## 2011-05-15 DIAGNOSIS — E119 Type 2 diabetes mellitus without complications: Secondary | ICD-10-CM | POA: Insufficient documentation

## 2011-05-15 DIAGNOSIS — R0602 Shortness of breath: Secondary | ICD-10-CM | POA: Insufficient documentation

## 2011-05-15 DIAGNOSIS — E869 Volume depletion, unspecified: Secondary | ICD-10-CM | POA: Insufficient documentation

## 2011-05-15 DIAGNOSIS — D571 Sickle-cell disease without crisis: Secondary | ICD-10-CM | POA: Insufficient documentation

## 2011-05-15 HISTORY — DX: Sickle-cell disease without crisis: D57.1

## 2011-05-15 NOTE — ED Notes (Signed)
[id number] Expected date:05/15/11<BR> Expected time:11:15 PM<BR> Means of arrival:Ambulance<BR> Comments:<BR> EMS 211 GC - sickle cell pt/syncope

## 2011-05-15 NOTE — ED Notes (Signed)
Pt was in the hospital one week ago for a sickle cell crisis, today he states that he drank tea all day and juice with his meal then he became diaphoretic and dizzy, per ems blood sugar was 213

## 2011-05-16 LAB — BASIC METABOLIC PANEL
Chloride: 99 mEq/L (ref 96–112)
GFR calc Af Amer: 57 mL/min — ABNORMAL LOW (ref >90)
GFR calc non Af Amer: 49 mL/min — ABNORMAL LOW (ref >90)
Potassium: 3.9 mEq/L (ref 3.5–5.1)
Sodium: 137 mEq/L (ref 135–145)

## 2011-05-16 LAB — CBC
Hemoglobin: 8.7 g/dL — ABNORMAL LOW (ref 13.0–17.0)
MCHC: 33.7 g/dL (ref 30.0–36.0)
RDW: 16.3 % — ABNORMAL HIGH (ref 11.5–15.5)
WBC: 20.5 10*3/uL — ABNORMAL HIGH (ref 4.0–10.5)

## 2011-05-16 MED ORDER — OXYCODONE-ACETAMINOPHEN 5-325 MG PO TABS
1.0000 | ORAL_TABLET | Freq: Once | ORAL | Status: AC
  Administered 2011-05-16: 1 via ORAL

## 2011-05-16 MED ORDER — SODIUM CHLORIDE 0.9 % IV BOLUS (SEPSIS)
1000.0000 mL | Freq: Once | INTRAVENOUS | Status: AC
  Administered 2011-05-16: 1000 mL via INTRAVENOUS

## 2011-05-16 MED ORDER — OXYCODONE-ACETAMINOPHEN 5-325 MG PO TABS
ORAL_TABLET | ORAL | Status: AC
  Administered 2011-05-16: 1 via ORAL
  Filled 2011-05-16: qty 1

## 2011-05-16 NOTE — ED Provider Notes (Signed)
History     CSN: [id number] Arrival date & time: 05/15/2011 11:28 PM   First MD Initiated Contact with Patient 05/16/11 0013      Chief Complaint  Patient presents with  . Hyperglycemia  . Shortness of Breath    (Consider location/radiation/quality/duration/timing/severity/associated sxs/prior treatment) The history is provided by the patient and the spouse.   the patient reports he was in the shower and when he got out of the shower this evening he became lightheaded and passed out.  He had no preceding chest pain or palpitations.  His family reports his been to be more juice today and they're concerned that his blood sugar was high.  The patient denies nausea vomiting and diarrhea.  He denies fever and chills.  He denies shortness of breath.  The patient reports he feels good at this time.  Family reports baseline mental status.  Patient denies melena and hematochezia.  His had no other symptoms.  Past Medical History  Diagnosis Date  . Sickle cell anemia   . Diabetes mellitus     History reviewed. No pertinent past surgical history.  History reviewed. No pertinent family history.  History  Substance Use Topics  . Smoking status: Not on file  . Smokeless tobacco: Not on file  . Alcohol Use: No      Review of Systems  All other systems reviewed and are negative.    Allergies  Review of patient's allergies indicates no known allergies.  Home Medications  No current outpatient prescriptions on file.  BP 141/89  Pulse 65  Temp(Src) 98.2 F (36.8 C) (Oral)  Resp 20  SpO2 100%  Physical Exam  Nursing note and vitals reviewed. Constitutional: He is oriented to person, place, and time. He appears well-developed and well-nourished.  HENT:  Head: Normocephalic and atraumatic.  Eyes: EOM are normal.  Neck: Normal range of motion.  Cardiovascular: Normal rate, regular rhythm, normal heart sounds and intact distal pulses.   Pulmonary/Chest: Effort normal and  breath sounds normal. No respiratory distress.  Abdominal: Soft. He exhibits no distension. There is no tenderness.  Musculoskeletal: Normal range of motion.  Neurological: He is alert and oriented to person, place, and time.  Skin: Skin is warm and dry.  Psychiatric: He has a normal mood and affect. Judgment normal.    ED Course  Procedures (including critical care time)  Labs Reviewed  CBC - Abnormal; Notable for the following:    WBC 20.5 (*)    RBC 3.75 (*)    Hemoglobin 8.7 (*)    HCT 25.8 (*)    MCV 68.8 (*)    MCH 23.2 (*)    RDW 16.3 (*)    Platelets 112 (*)    All other components within normal limits  BASIC METABOLIC PANEL - Abnormal; Notable for the following:    Glucose, Bld 200 (*)    Creatinine, Ser 1.47 (*)    GFR calc non Af Amer 49 (*)    GFR calc Af Amer 57 (*)    All other components within normal limits   No results found.   1. Volume depletion       MDM  The patient was hydrated in the emergency department.  He feels much better at this time.  I suspect she was mildly hypokalemic possibly from hyperglycemia through the week.  His hemoglobin is 8.7 that is baseline for him.  His white count is 20,000 which is not that different than his baseline white blood cell  count which appears to be anywhere from 17-19,000 over the past year.  He has no anginal symptoms.  I doubt this was cardiac syncope.  No doubt neurogenic syncope.  I suspect this is hypovolemic syncope as the patient is now able to stand bed without any lightheadedness or weakness of the         Hoy Morn, MD 05/16/11 [phone]

## 2011-05-16 NOTE — ED Notes (Signed)
Patient stable upon discharge.  Discharged home to wife care.

## 2011-07-27 ENCOUNTER — Other Ambulatory Visit: Payer: Self-pay

## 2011-07-27 ENCOUNTER — Encounter (HOSPITAL_COMMUNITY): Payer: Self-pay | Admitting: Emergency Medicine

## 2011-07-27 ENCOUNTER — Inpatient Hospital Stay (HOSPITAL_COMMUNITY)
Admission: EM | Admit: 2011-07-27 | Discharge: 2011-07-31 | DRG: 195 | Disposition: A | Discharge status: Home / Self Care | Payer: Medicare Other | Source: Ambulatory Visit | Attending: Internal Medicine | Admitting: Internal Medicine

## 2011-07-27 DIAGNOSIS — E119 Type 2 diabetes mellitus without complications: Secondary | ICD-10-CM | POA: Diagnosis present

## 2011-07-27 DIAGNOSIS — I10 Essential (primary) hypertension: Secondary | ICD-10-CM

## 2011-07-27 DIAGNOSIS — D5700 Hb-SS disease with crisis, unspecified: Secondary | ICD-10-CM

## 2011-07-27 DIAGNOSIS — I447 Left bundle-branch block, unspecified: Secondary | ICD-10-CM | POA: Diagnosis present

## 2011-07-27 DIAGNOSIS — D571 Sickle-cell disease without crisis: Secondary | ICD-10-CM | POA: Diagnosis present

## 2011-07-27 DIAGNOSIS — D72829 Elevated white blood cell count, unspecified: Secondary | ICD-10-CM | POA: Diagnosis present

## 2011-07-27 DIAGNOSIS — IMO0002 Reserved for concepts with insufficient information to code with codable children: Secondary | ICD-10-CM

## 2011-07-27 DIAGNOSIS — R110 Nausea: Secondary | ICD-10-CM

## 2011-07-27 DIAGNOSIS — Z87891 Personal history of nicotine dependence: Secondary | ICD-10-CM

## 2011-07-27 DIAGNOSIS — J189 Pneumonia, unspecified organism: Principal | ICD-10-CM | POA: Diagnosis present

## 2011-07-27 DIAGNOSIS — R079 Chest pain, unspecified: Secondary | ICD-10-CM | POA: Diagnosis present

## 2011-07-27 DIAGNOSIS — Z885 Allergy status to narcotic agent status: Secondary | ICD-10-CM

## 2011-07-27 DIAGNOSIS — R0789 Other chest pain: Secondary | ICD-10-CM | POA: Diagnosis present

## 2011-07-27 DIAGNOSIS — E1129 Type 2 diabetes mellitus with other diabetic kidney complication: Secondary | ICD-10-CM | POA: Diagnosis present

## 2011-07-27 DIAGNOSIS — Z79899 Other long term (current) drug therapy: Secondary | ICD-10-CM

## 2011-07-27 HISTORY — DX: Essential (primary) hypertension: I10

## 2011-07-27 LAB — PROTIME-INR
INR: 1.09 (ref 0.00–1.49)
Prothrombin Time: 14.3 seconds (ref 11.6–15.2)

## 2011-07-27 NOTE — ED Notes (Addendum)
Patient states that around 1900, after he was getting out of the shower patient became very dizzy, had diaphoresis, nausea and an "aching" pain in his chest and lower back.  Patient states that he has had chest pain in the past before; patient has a sickle cell and was told that he was going in a sickle cell crisis.  Patient also has a history of diabetes.  Patient rates chest pain 9/10 on the numerical pain scale; describes location as "mid-sternum"; pain does not radiate anywhere.  Denies shortness of breath -- patient does say that he feels better sitting up than sitting down.  Patient states that he vomited three times since 1930.

## 2011-07-27 NOTE — ED Provider Notes (Signed)
History     CSN: [id number]  Arrival date & time 07/27/11  2249   First MD Initiated Contact with Patient 07/27/11 2336      Chief Complaint  Patient presents with  . Chest Pain    (Consider location/radiation/quality/duration/timing/severity/associated sxs/prior treatment) HPI Comments: Patient has history of sickle cell disease, was in the shower and became tight in the chest, abdomen, back, arms, and legs.  He felt like this was his sickle cell coming on.    Patient is a 61 y.o. male presenting with chest pain. The history is provided by the patient.  Chest Pain The chest pain began 3 - 5 hours ago. Chest pain occurs constantly. The chest pain is worsening. The pain is associated with breathing and coughing. The severity of the pain is moderate. The quality of the pain is described as aching. The pain does not radiate. Chest pain is worsened by certain positions and deep breathing. Primary symptoms include fatigue. Pertinent negatives for primary symptoms include no fever, no cough, no nausea, no vomiting and no dizziness. He tried nothing for the symptoms.     Past Medical History  Diagnosis Date  . Sickle cell anemia   . Diabetes mellitus   . Hypertension     History reviewed. No pertinent past surgical history.  History reviewed. No pertinent family history.  History  Substance Use Topics  . Smoking status: Never Smoker   . Smokeless tobacco: Not on file  . Alcohol Use: No      Review of Systems  Constitutional: Positive for fatigue. Negative for fever.  Respiratory: Negative for cough.   Cardiovascular: Positive for chest pain.  Gastrointestinal: Negative for nausea and vomiting.  Neurological: Negative for dizziness.  All other systems reviewed and are negative.    Allergies  Morphine and related  Home Medications   Current Outpatient Rx  Name Route Sig Dispense Refill  . AMLODIPINE BESY-BENAZEPRIL HCL 10-20 MG PO CAPS Oral Take 1 capsule by mouth  daily.    Marland Kitchen GLIPIZIDE ER 10 MG PO TB24 Oral Take 10 mg by mouth daily.    Marland Kitchen METFORMIN HCL 1000 MG PO TABS Oral Take 1,000 mg by mouth 2 (two) times daily with a meal.    . OMEPRAZOLE 20 MG PO CPDR Oral Take 20 mg by mouth daily.      BP 169/98  Pulse 75  Temp(Src) 98.1 F (36.7 C) (Oral)  Resp 16  SpO2 100%  Physical Exam  Nursing note and vitals reviewed. Constitutional: He is oriented to person, place, and time. He appears well-developed and well-nourished. No distress.  HENT:  Head: Normocephalic and atraumatic.  Mouth/Throat: Oropharynx is clear and moist.  Neck: Normal range of motion. Neck supple.  Cardiovascular: Normal rate and regular rhythm.  Exam reveals no friction rub.   No murmur heard. Pulmonary/Chest: Effort normal and breath sounds normal. No respiratory distress.  Abdominal: Soft. Bowel sounds are normal. He exhibits no distension. There is no tenderness.  Musculoskeletal: Normal range of motion. He exhibits no edema.  Neurological: He is alert and oriented to person, place, and time.  Skin: Skin is warm and dry. He is not diaphoretic.    ED Course  Procedures (including critical care time)   Labs Reviewed  CBC  DIFFERENTIAL  BASIC METABOLIC PANEL  PROTIME-INR  CARDIAC PANEL(CRET KIN+CKTOT+MB+TROPI)  URINE RAPID DRUG SCREEN (HOSP PERFORMED)  URINALYSIS, ROUTINE W REFLEX MICROSCOPIC  RETICULOCYTES   No results found.   No diagnosis found.  Date: 07/27/2011  Rate: 74  Rhythm: normal sinus rhythm  QRS Axis: normal  Intervals: normal  ST/T Wave abnormalities: nonspecific ST changes  Conduction Disutrbances:left bundle branch block  Narrative Interpretation:   Old EKG Reviewed: changes noted    MDM  Cardiac workup unremarkable.  The labs and history are more consistent with a sickle cell crisis.  He has a markedly elevated wbc ct and a chest xray that is suggestive of pneumonia in the right lower lobe.  I will consult medicine for admission.           Veryl Speak, MD 07/29/11 [phone]

## 2011-07-27 NOTE — ED Provider Notes (Signed)
EKG brought to me from triage.  New left bundle branch block.  Sgarbossa criteria not met.  Patient with substernal chest pain since 1900 that is constant.  Also with pain in joints and back.  Has had similar pain with sickle cell crisis but this is worse.  Remote history of cocaine abuse. BP 155/100  Pulse 75  Temp(Src) 98.1 F (36.7 C) (Oral)  Resp 18  SpO2 100%  Given patient's pain and EKG findings, STEMI considered and discussed with Dr. Pernell Dupre who is on STEMI call.  He agrees with not calling a code STEMI at this point.  Recommends IV nitro and cycling of enzymes.     Date: 07/27/2011  Rate: 74  Rhythm: normal sinus rhythm  QRS Axis: normal  Intervals: normal  ST/T Wave abnormalities: normal  Conduction Disutrbances:left bundle branch block  Narrative Interpretation: new LBBB  Old EKG Reviewed: changes noted    Ezequiel Essex, MD 07/27/11 2332

## 2011-07-28 ENCOUNTER — Emergency Department (HOSPITAL_COMMUNITY): Payer: Medicare Other

## 2011-07-28 ENCOUNTER — Encounter (HOSPITAL_COMMUNITY): Payer: Self-pay | Admitting: Internal Medicine

## 2011-07-28 DIAGNOSIS — R079 Chest pain, unspecified: Secondary | ICD-10-CM | POA: Diagnosis present

## 2011-07-28 DIAGNOSIS — D72829 Elevated white blood cell count, unspecified: Secondary | ICD-10-CM | POA: Diagnosis present

## 2011-07-28 DIAGNOSIS — E1129 Type 2 diabetes mellitus with other diabetic kidney complication: Secondary | ICD-10-CM | POA: Diagnosis present

## 2011-07-28 DIAGNOSIS — I517 Cardiomegaly: Secondary | ICD-10-CM

## 2011-07-28 DIAGNOSIS — I10 Essential (primary) hypertension: Secondary | ICD-10-CM | POA: Diagnosis present

## 2011-07-28 DIAGNOSIS — I447 Left bundle-branch block, unspecified: Secondary | ICD-10-CM | POA: Diagnosis present

## 2011-07-28 LAB — DIFFERENTIAL
Basophils Relative: 1 % (ref 0–1)
Eosinophils Relative: 2 % (ref 0–5)
Lymphocytes Relative: 12 % (ref 12–46)
Monocytes Relative: 8 % (ref 3–12)
Neutrophils Relative %: 77 % (ref 43–77)

## 2011-07-28 LAB — CARDIAC PANEL(CRET KIN+CKTOT+MB+TROPI)
CK, MB: 3.9 ng/mL (ref 0.3–4.0)
CK, MB: 4.1 ng/mL — ABNORMAL HIGH (ref 0.3–4.0)
CK, MB: 8.1 ng/mL (ref 0.3–4.0)
Relative Index: 2.1 (ref 0.0–2.5)
Relative Index: 2.6 — ABNORMAL HIGH (ref 0.0–2.5)
Total CK: 158 U/L (ref 7–232)
Total CK: 197 U/L (ref 7–232)
Total CK: 312 U/L — ABNORMAL HIGH (ref 7–232)
Troponin I: <0.3 ng/mL (ref <0.30)
Troponin I: <0.3 ng/mL (ref <0.30)

## 2011-07-28 LAB — COMPREHENSIVE METABOLIC PANEL
ALT: 24 U/L (ref 0–53)
Alkaline Phosphatase: 96 U/L (ref 39–117)
BUN: 12 mg/dL (ref 6–23)
CO2: 23 mEq/L (ref 19–32)
Calcium: 10.7 mg/dL — ABNORMAL HIGH (ref 8.4–10.5)
GFR calc Af Amer: 75 mL/min — ABNORMAL LOW (ref >90)
GFR calc non Af Amer: 65 mL/min — ABNORMAL LOW (ref >90)
Glucose, Bld: 174 mg/dL — ABNORMAL HIGH (ref 70–99)
Sodium: 139 mEq/L (ref 135–145)

## 2011-07-28 LAB — BASIC METABOLIC PANEL
CO2: 23 mEq/L (ref 19–32)
Calcium: 10.7 mg/dL — ABNORMAL HIGH (ref 8.4–10.5)
Chloride: 100 mEq/L (ref 96–112)
Potassium: 4.5 mEq/L (ref 3.5–5.1)
Sodium: 136 mEq/L (ref 135–145)

## 2011-07-28 LAB — GLUCOSE, CAPILLARY
Glucose-Capillary: 178 mg/dL — ABNORMAL HIGH (ref 70–99)
Glucose-Capillary: 257 mg/dL — ABNORMAL HIGH (ref 70–99)

## 2011-07-28 LAB — RAPID URINE DRUG SCREEN, HOSP PERFORMED
Barbiturates: NOT DETECTED
Benzodiazepines: NOT DETECTED

## 2011-07-28 LAB — CBC
HCT: 26.6 % — ABNORMAL LOW (ref 39.0–52.0)
Hemoglobin: 8.9 g/dL — ABNORMAL LOW (ref 13.0–17.0)
MCHC: 33.7 g/dL (ref 30.0–36.0)
RBC: 3.9 MIL/uL — ABNORMAL LOW (ref 4.22–5.81)
RDW: 17.2 % — ABNORMAL HIGH (ref 11.5–15.5)
WBC: 21.3 10*3/uL — ABNORMAL HIGH (ref 4.0–10.5)

## 2011-07-28 LAB — URINALYSIS, ROUTINE W REFLEX MICROSCOPIC
Bilirubin Urine: NEGATIVE
Ketones, ur: NEGATIVE mg/dL
Nitrite: NEGATIVE
Urobilinogen, UA: 1 mg/dL (ref 0.0–1.0)

## 2011-07-28 LAB — RETICULOCYTES: Retic Ct Pct: 7.6 % — ABNORMAL HIGH (ref 0.4–3.1)

## 2011-07-28 LAB — LIPASE, BLOOD: Lipase: 30 U/L (ref 11–59)

## 2011-07-28 MED ORDER — SODIUM CHLORIDE 0.9 % IJ SOLN
10.0000 mL | INTRAMUSCULAR | Status: DC | PRN
  Administered 2011-07-28 – 2011-07-31 (×5): 10 mL

## 2011-07-28 MED ORDER — AMLODIPINE BESYLATE 10 MG PO TABS
10.0000 mg | ORAL_TABLET | Freq: Every day | ORAL | Status: DC
  Administered 2011-07-28 – 2011-07-31 (×4): 10 mg via ORAL
  Filled 2011-07-28 (×4): qty 1

## 2011-07-28 MED ORDER — LEVOFLOXACIN 750 MG PO TABS
750.0000 mg | ORAL_TABLET | Freq: Every day | ORAL | Status: DC
  Administered 2011-07-28 – 2011-07-30 (×3): 750 mg via ORAL
  Filled 2011-07-28 (×4): qty 1

## 2011-07-28 MED ORDER — AMLODIPINE BESYLATE 10 MG PO TABS
10.0000 mg | ORAL_TABLET | Freq: Every day | ORAL | Status: DC
  Filled 2011-07-28: qty 1

## 2011-07-28 MED ORDER — SODIUM CHLORIDE 0.9 % IV SOLN
INTRAVENOUS | Status: DC
  Administered 2011-07-28 (×2): via INTRAVENOUS
  Administered 2011-07-29: 20 mL/h via INTRAVENOUS
  Administered 2011-07-29 – 2011-07-31 (×2): via INTRAVENOUS

## 2011-07-28 MED ORDER — AMLODIPINE BESY-BENAZEPRIL HCL 10-20 MG PO CAPS: 1.0000 | ORAL_CAPSULE | Freq: Every day | ORAL | Status: DC

## 2011-07-28 MED ORDER — OXYCODONE-ACETAMINOPHEN 5-325 MG PO TABS
1.0000 | ORAL_TABLET | ORAL | Status: DC | PRN
  Administered 2011-07-29 – 2011-07-31 (×3): 2 via ORAL
  Filled 2011-07-28 (×3): qty 2

## 2011-07-28 MED ORDER — SENNA 8.6 MG PO TABS
1.0000 | ORAL_TABLET | Freq: Two times a day (BID) | ORAL | Status: DC
  Administered 2011-07-28 – 2011-07-31 (×7): 8.6 mg via ORAL
  Filled 2011-07-28 (×8): qty 1

## 2011-07-28 MED ORDER — SODIUM CHLORIDE 0.9 % IV SOLN
INTRAVENOUS | Status: AC
  Administered 2011-07-28: 02:00:00 via INTRAVENOUS

## 2011-07-28 MED ORDER — SODIUM CHLORIDE 0.9 % IJ SOLN: 3.0000 mL | Freq: Two times a day (BID) | INTRAMUSCULAR | Status: DC

## 2011-07-28 MED ORDER — INSULIN ASPART 100 UNIT/ML ~~LOC~~ SOLN
0.0000 [IU] | Freq: Three times a day (TID) | SUBCUTANEOUS | Status: DC
  Administered 2011-07-28: 5 [IU] via SUBCUTANEOUS
  Administered 2011-07-28: 2 [IU] via SUBCUTANEOUS
  Administered 2011-07-29: 3 [IU] via SUBCUTANEOUS
  Administered 2011-07-29: 2 [IU] via SUBCUTANEOUS
  Administered 2011-07-29: 3 [IU] via SUBCUTANEOUS
  Administered 2011-07-30 (×2): 2 [IU] via SUBCUTANEOUS
  Administered 2011-07-30: 3 [IU] via SUBCUTANEOUS
  Administered 2011-07-31: 2 [IU] via SUBCUTANEOUS
  Administered 2011-07-31: 3 [IU] via SUBCUTANEOUS
  Filled 2011-07-28: qty 3

## 2011-07-28 MED ORDER — DEXTROSE 5 % IV SOLN
1.0000 g | Freq: Once | INTRAVENOUS | Status: AC
  Administered 2011-07-28: 1 g via INTRAVENOUS
  Filled 2011-07-28: qty 10

## 2011-07-28 MED ORDER — LEVOFLOXACIN 750 MG PO TABS
750.0000 mg | ORAL_TABLET | ORAL | Status: AC
  Administered 2011-07-28: 750 mg via ORAL
  Filled 2011-07-28: qty 1

## 2011-07-28 MED ORDER — HEPARIN SODIUM (PORCINE) 5000 UNIT/ML IJ SOLN
5000.0000 [IU] | Freq: Three times a day (TID) | INTRAMUSCULAR | Status: DC
  Administered 2011-07-28 – 2011-07-31 (×10): 5000 [IU] via SUBCUTANEOUS
  Filled 2011-07-28 (×13): qty 1

## 2011-07-28 MED ORDER — DEXTROSE 5 % IV SOLN
500.0000 mg | Freq: Once | INTRAVENOUS | Status: DC
  Filled 2011-07-28: qty 500

## 2011-07-28 MED ORDER — BENAZEPRIL HCL 20 MG PO TABS
20.0000 mg | ORAL_TABLET | Freq: Every day | ORAL | Status: DC
  Administered 2011-07-28 – 2011-07-31 (×4): 20 mg via ORAL
  Filled 2011-07-28 (×4): qty 1

## 2011-07-28 MED ORDER — ONDANSETRON HCL 4 MG PO TABS
4.0000 mg | ORAL_TABLET | Freq: Four times a day (QID) | ORAL | Status: DC | PRN
  Administered 2011-07-28: 4 mg via ORAL
  Filled 2011-07-28: qty 1

## 2011-07-28 MED ORDER — HYDROMORPHONE HCL PF 1 MG/ML IJ SOLN
1.0000 mg | Freq: Once | INTRAMUSCULAR | Status: AC
  Administered 2011-07-28: 1 mg via INTRAVENOUS
  Filled 2011-07-28: qty 1

## 2011-07-28 MED ORDER — BENAZEPRIL HCL 20 MG PO TABS
20.0000 mg | ORAL_TABLET | Freq: Every day | ORAL | Status: DC
  Filled 2011-07-28: qty 1

## 2011-07-28 MED ORDER — DOCUSATE SODIUM 100 MG PO CAPS
100.0000 mg | ORAL_CAPSULE | Freq: Two times a day (BID) | ORAL | Status: DC
  Administered 2011-07-28 – 2011-07-31 (×7): 100 mg via ORAL
  Filled 2011-07-28 (×8): qty 1

## 2011-07-28 MED ORDER — HYDROMORPHONE HCL PF 1 MG/ML IJ SOLN
1.0000 mg | INTRAMUSCULAR | Status: DC | PRN
  Administered 2011-07-28: 1 mg via INTRAVENOUS
  Filled 2011-07-28: qty 1

## 2011-07-28 MED ORDER — ONDANSETRON HCL 4 MG/2ML IJ SOLN: 4.0000 mg | Freq: Three times a day (TID) | INTRAMUSCULAR | Status: DC | PRN

## 2011-07-28 MED ORDER — PANTOPRAZOLE SODIUM 40 MG PO TBEC
40.0000 mg | DELAYED_RELEASE_TABLET | Freq: Every day | ORAL | Status: DC
  Administered 2011-07-28 – 2011-07-31 (×4): 40 mg via ORAL
  Filled 2011-07-28 (×4): qty 1

## 2011-07-28 MED ORDER — ACETAMINOPHEN 325 MG PO TABS
650.0000 mg | ORAL_TABLET | Freq: Four times a day (QID) | ORAL | Status: DC | PRN
  Administered 2011-07-29: 650 mg via ORAL
  Filled 2011-07-28: qty 2

## 2011-07-28 MED ORDER — HYDROMORPHONE HCL PF 1 MG/ML IJ SOLN
1.0000 mg | INTRAMUSCULAR | Status: DC | PRN
  Administered 2011-07-28: 1 mg via INTRAVENOUS
  Administered 2011-07-28 – 2011-07-30 (×6): 2 mg via INTRAVENOUS
  Administered 2011-07-30: 1 mg via INTRAVENOUS
  Administered 2011-07-30: 2 mg via INTRAVENOUS
  Administered 2011-07-31: 1 mg via INTRAVENOUS
  Filled 2011-07-28 (×2): qty 2
  Filled 2011-07-28: qty 1
  Filled 2011-07-28 (×2): qty 2
  Filled 2011-07-28: qty 1
  Filled 2011-07-28 (×3): qty 2
  Filled 2011-07-28: qty 1

## 2011-07-28 MED ORDER — ONDANSETRON HCL 4 MG/2ML IJ SOLN
4.0000 mg | Freq: Four times a day (QID) | INTRAMUSCULAR | Status: DC | PRN
  Administered 2011-07-29: 4 mg via INTRAVENOUS
  Filled 2011-07-28 (×2): qty 2

## 2011-07-28 MED ORDER — ACETAMINOPHEN 650 MG RE SUPP: 650.0000 mg | Freq: Four times a day (QID) | RECTAL | Status: DC | PRN

## 2011-07-28 NOTE — ED Notes (Signed)
IV attempt x1.  Unsuccessful.  IV team paged.

## 2011-07-28 NOTE — Progress Notes (Signed)
Utilization Review Completed.Donne Anon T2/22/2013
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
## 2011-07-28 NOTE — Progress Notes (Signed)
  Echocardiogram 2D Echocardiogram has been performed.  [name] Jaquaya Coyle 07/28/2011, 1:49 PM

## 2011-07-28 NOTE — ED Notes (Signed)
Patient with chest pain, shortness of breath and high blood sugar per patient since earlier today.  Patient states that the pain started, now he is having pain in his joints with his chest pain.  History of sickle cell and diabetes.

## 2011-07-28 NOTE — ED Notes (Signed)
Paged [facility] for Dr Stark Jock

## 2011-07-28 NOTE — ED Notes (Signed)
IV team returned page.  Enroute to ED.

## 2011-07-28 NOTE — H&P (Signed)
PCP:   Ricke Hey, MD, MD   Chief Complaint:  Chest pain, dizziness, nausea  HPI: 61yoM with h/o sickle cell disease, DM, HTN presents with chest pain, dizziness,  diaphoresis and nausea/vomiting and found to have new CXR opacity.   Pt was last admitted to [facility] 08/2010 with sickle cell pain, after having  been disharged the day before but having continued pain. He was then seen  in the ED 05/2011 after having syncope after taking a hot shower which was  thought due to hypovolemia and he was discharged from the ED.   Tonight, he was taking a hot shower, and while doing so developed  substernal chest pain radiating into his lower back and under bilateral  ribs. When getting out of the shower, he got dizzy, SOB, hot and flushed,  and sweaty so laid down for a long time with a cool rag on his head, until  eventually he got very nauseous and started vomiting. For this he came to  the ED.   In the ED vitals were stable. Labs with normal chem except Ca 10.7, glucose  238. Cardiac enzymes negative x1. WBC was 26.1 with mild left shift. Hct  26.6 with MCV 68; retic ct pct 7.6%. Plts normal. CXR with mild medial  right basilar opacity, question mild PNA. Pt has been given Dilaudid x1.   Pt also states he was previously very active, walking 1-2 miles per day,  however for the past couple months, everytime he goes out to to walk he  gets substernal pressure, a "squeezing" sensation, and on his own states  "it feels like an elephant on my chest and I can't catch my breath", with  associated dizziness. It passes if he rests. He has noted increased ankle  swelling over the past couple month.   He has also been having increased abdominal swelling and distention with  pain for a few months, and has had a dry cough for the past week. He has  subjective off/on sweats. He is having minimal joint pain consistent with  sickle. Otherwise ROS negative.    Past Medical History  Diagnosis  Date  . Sickle cell anemia     Hgb SS disease   . Diabetes mellitus   . Hypertension     Past Surgical History  Procedure Date  . Colon surgery     Colon cancer surgery, removed small amt not full colectomy  . Cholecystectomy   . Leg surgery     For ? gangrene after running into barbwire fence at 61yo    Medications:  HOME MEDS: Reconciled with pt Prior to Admission medications   Medication Sig Start Date End Date Taking? Authorizing Provider  amLODipine-benazepril (LOTREL) 10-20 MG per capsule Take 1 capsule by mouth daily.   Yes Historical Provider, MD  glipiZIDE (GLUCOTROL XL) 10 MG 24 hr tablet Take 10 mg by mouth daily.   Yes Historical Provider, MD  metFORMIN (GLUCOPHAGE) 1000 MG tablet Take 1,000 mg by mouth 2 (two) times daily with a meal.   Yes Historical Provider, MD  omeprazole (PRILOSEC) 20 MG capsule Take 20 mg by mouth daily.   Yes Historical Provider, MD    Allergies:  Allergies  Allergen Reactions  . Morphine And Related Itching and Swelling    Social History:   reports that he has quit smoking. He does not have any smokeless tobacco history on file. He reports that he does not drink alcohol or use illicit drugs. Retired Administrator,  on disability. Former smoker. Living by himself, able to get around [facility]. Has a wife who checks on him but they don't live together. Quit drinking and doing drugs due to health  Family History: Family History  Problem Relation Age of Onset  . Venous thrombosis Mother     Dec. of blood clot   . Throat cancer Father     Deceased of throat cancer    Physical Exam: Filed Vitals:   07/27/11 2255 07/27/11 2344 07/28/11 0103 07/28/11 0230  BP: 155/100 169/98 159/94 149/96  Pulse: 75   82  Temp: 98.1 F (36.7 C)     TempSrc: Oral     Resp: 18 16 18 20   SpO2: 100% 100% 100% 99%   Blood pressure 149/96, pulse 82, temperature 98.1 F (36.7 C), temperature source Oral, resp. rate 20, SpO2 99.00%.  Gen: Middle aged M in no  distress, stable appearing, pleasant, able to  relate history well, breathing comfortably.  HEENT: Dense cataract in the right eye, left is normal and contricts  normally. Conjunctival erythema and possible pterygion. Mouth a bit dry  appearing but overall normal.  Lungs: CTAB no w/c/r, normal overall without adventitious lung sounds Heart: Regular, not tachycardic, but does has crescendo/decrescendo type  murmur loudest at LUSB Abd: Quite distended and moderately tight, with facial grimacing and  subjective pain with palpation Extrem: Warm, perfusing normally, without cyanosis, paleness, coolness. No  BLE edema noted in thin legs.  Neuro: Alert, attentive, able to give history, CN 2-12 intact, moves  extremities well, grossly non-focal   Labs & Imaging Results for orders placed during the hospital encounter of 07/27/11 (from the past 48 hour(s))  CBC     Status: Abnormal   Collection Time   07/27/11 11:32 PM      Component Value Range Comment   WBC 26.1 (*) 4.0 - 10.5 (K/uL) WHITE COUNT CONFIRMED ON SMEAR   RBC 3.90 (*) 4.22 - 5.81 (MIL/uL)    Hemoglobin 8.9 (*) 13.0 - 17.0 (g/dL)    HCT 26.6 (*) 39.0 - 52.0 (%)    MCV 68.2 (*) 78.0 - 100.0 (fL)    MCH 22.8 (*) 26.0 - 34.0 (pg)    MCHC 33.5  30.0 - 36.0 (g/dL)    RDW 17.3 (*) 11.5 - 15.5 (%)    Platelets 273  150 - 400 (K/uL)   DIFFERENTIAL     Status: Abnormal   Collection Time   07/27/11 11:32 PM      Component Value Range Comment   Neutrophils Relative 77  43 - 77 (%)    Lymphocytes Relative 12  12 - 46 (%)    Monocytes Relative 8  3 - 12 (%)    Eosinophils Relative 2  0 - 5 (%)    Basophils Relative 1  0 - 1 (%)    Neutro Abs 20.1 (*) 1.7 - 7.7 (K/uL)    Lymphs Abs 3.1  0.7 - 4.0 (K/uL)    Monocytes Absolute 2.1 (*) 0.1 - 1.0 (K/uL)    Eosinophils Absolute 0.5  0.0 - 0.7 (K/uL)    Basophils Absolute 0.3 (*) 0.0 - 0.1 (K/uL)    RBC Morphology RARE NRBCs      WBC Morphology MILD LEFT SHIFT (1-5% METAS, OCC MYELO, OCC  BANDS)     BASIC METABOLIC PANEL     Status: Abnormal   Collection Time   07/27/11 11:32 PM      Component Value Range Comment  Sodium 136  135 - 145 (mEq/L)    Potassium 4.5  3.5 - 5.1 (mEq/L)    Chloride 100  96 - 112 (mEq/L)    CO2 23  19 - 32 (mEq/L)    Glucose, Bld 238 (*) 70 - 99 (mg/dL)    BUN 12  6 - 23 (mg/dL)    Creatinine, Ser 1.28  0.50 - 1.35 (mg/dL)    Calcium 10.7 (*) 8.4 - 10.5 (mg/dL)    GFR calc non Af Amer 58 (*) >90 (mL/min)    GFR calc Af Amer 67 (*) >90 (mL/min)   PROTIME-INR     Status: Normal   Collection Time   07/27/11 11:32 PM      Component Value Range Comment   Prothrombin Time 14.3  11.6 - 15.2 (seconds)    INR 1.09  0.00 - 1.49    CARDIAC PANEL(CRET KIN+CKTOT+MB+TROPI)     Status: Normal   Collection Time   07/27/11 11:33 PM      Component Value Range Comment   Total CK 158  7 - 232 (U/L)    CK, MB 3.9  0.3 - 4.0 (ng/mL)    Troponin I <0.30  <0.30 (ng/mL)    Relative Index 2.5  0.0 - 2.5    RETICULOCYTES     Status: Abnormal   Collection Time   07/27/11 11:38 PM      Component Value Range Comment   Retic Ct Pct 7.6 (*) 0.4 - 3.1 (%)    RBC. 3.31 (*) 4.22 - 5.81 (MIL/uL)    Retic Count, Manual 251.6 (*) 19.0 - 186.0 (K/uL)    Dg Chest Portable 1 View  07/28/2011  *RADIOLOGY REPORT*  Clinical Data: Chest pain and abdominal pain; shortness of breath.  PORTABLE CHEST - 1 VIEW  Comparison: Chest radiograph performed 08/05/2010  Findings: The lungs are well-aerated.  Mild medial right basilar airspace opacity raises question for mild pneumonia.  There is no evidence of pleural effusion or pneumothorax.  The cardiomediastinal silhouette is within normal limits.  No acute osseous abnormalities are seen.  Chronic sclerotic changes are seen at both humeral heads.  IMPRESSION: Mild medial right basilar airspace opacity raises question for mild pneumonia.  Original Report Authenticated By: Santa Lighter, M.D.   ECG: NSR 74bpm, normal axis, normal P and PR, wide  LBBB 150 msec, normal  discordant T waves.   Prior ECG on 08/04/2010 showed NSR with normal axis, normal intervals  including QRS 78msec, but with LVH and likely repole abnmls with R anterior  STE and left anterior STD strain pattern.   Echo 2010 SUMMARY  -  Overall left ventricular systolic function was normal. Left        ventricular ejection fraction was estimated to be 55 %. There        were no left ventricular regional wall motion abnormalities.        Left ventricular wall thickness was mildly increased. There        was dyssynergic motion of the interventricular septum,        consistent with a conduction abnormality or paced rhythm.  -  There was mild mitral valvular regurgitation.  -  The left atrium was mildly dilated.   Impression Present on Admission:  .Sickle cell anemia .Hypertension .Diabetes mellitus .Chest pain .Leukocytosis .LBBB (left bundle branch block)  61yoM with h/o sickle cell disease, DM, HTN presents with chest pain, dizziness, diaphoresis and nausea/vomiting and found to have new CXR opacity.  1. Chest pain, dizziness, vagal symptoms: Workup so far most significant  for new opacification on CXR.   DDx for this is presyncope from unknown cause, sickle cell crisis +/- acute  chest syndrome, pleuritic pain and a PNA, or coronary cardiac pain/ACS.  Sickle pain/acute chest seems very possible based on history and CXR  finding. I'm not convinced he truly has a pneumonia, although he has had a  dry cough for a week. Most concerning though, is that he describes  exertional angina for the past couple months, relieved with rest, and has a  new LBBB since last ECG in 08/2010.   Sickle treatment is now complicated by having lost 2 PIV's now, and he'll  need a PICC. So overnight we'll treat him orally, keep on O2, give oral  levofloxacin.   - Keep on O2, give oral pain meds, order PICC, PO levofloxacin, trend  cardiac enzymes/ECG, get echo in the am,  consider cardiology consult vs  refer for outpt stress test.  - Consider CT chest with contrast if symptoms not improving and PICC is  placed.   2. Sickle cell disease: Possible contributing to symptoms as above. Worth  noting that his Hct is stable, retic count robust.   3. Abd distention: Notable on exam and tender but not peritoneal. Get plain  film, liver enzymes, and lipase   4. Leukocytosis: WBC 26 is actually not far from what appears to be  chronically elevated baseline, of unclear etiology; however he does have  mild left shift. Treat as above. F/u blood cultures, get UA.   5. IV access: Needs PICC, has failed PIV's x2.   6. HTN: continue home lotrel  7. Diabetes: holding home oral and will give SSI  Telemetry, [place] team 3 Presumed full code   Other plans as per orders.  Janathan Bribiesca 07/28/2011, 3:13 AM

## 2011-07-28 NOTE — Progress Notes (Signed)
DAILY PROGRESS NOTE                              [facility]AL [facility]  SUBJECTIVE: Feels okay no chest pain today, no shortness of breath or cough.  OBJECTIVE: BP 170/97  Pulse 86  Temp(Src) 98 F (36.7 C) (Oral)  Resp 18  Ht 6\' 1"  (1.854 m)  Wt 79.516 kg (175 lb 4.8 oz)  BMI 23.13 kg/m2  SpO2 97%  Intake/Output Summary (Last 24 hours) at 07/28/11 1720 Last data filed at 07/28/11 1420  Gross per 24 hour  Intake    240 ml  Output      0 ml  Net    240 ml                      Weight change:  Physical Exam: General: Alert and awake oriented x3 not in any acute distress. HEENT: anicteric sclera, pupils equal reactive to light and accommodation CVS: S1-S2 heard, no murmur rubs or gallops Chest: clear to auscultation bilaterally, no wheezing rales or rhonchi Abdomen:  normal bowel sounds, soft, nontender, nondistended, no organomegaly Neuro: Cranial nerves II-XII intact, no focal neurological deficits Extremities: no cyanosis, no clubbing or edema noted bilaterally   Lab Results:  [facility] 07/28/11 0655 07/27/11 2332  NA 139 136  K 4.7 4.5  CL 101 100  CO2 23 23  GLUCOSE 174* 238*  BUN 12 12  CREATININE 1.17 1.28  CALCIUM 10.7* 10.7*  MG -- --  PHOS -- --    Basename 07/28/11 0655  AST 40*  ALT 24  ALKPHOS 96  BILITOT 1.3*  PROT 9.2*  ALBUMIN 4.8    Basename 07/28/11 0655  LIPASE 30  AMYLASE --    Basename 07/28/11 0655 07/27/11 2332  WBC 21.3* 26.1*  NEUTROABS -- 20.1*  HGB 9.7* 8.9*  HCT 28.8* 26.6*  MCV 67.4* 68.2*  PLT PLATELET CLUMPS NOTED ON SMEAR, UNABLE TO ESTIMATE 273    Basename 07/28/11 1300 07/28/11 0655 07/27/11 2333  CKTOTAL 220 197 158  CKMB 4.7* 4.1* 3.9  CKMBINDEX -- -- --  TROPONINI <0.30 <0.30 <0.30   No components found with this basename: POCBNP:3 No results found for this basename: DDIMER:2 in the last 72 hours  Basename 07/28/11 1300  HGBA1C 5.0   No results found for this basename:  CHOL:2,HDL:2,LDLCALC:2,TRIG:2,CHOLHDL:2,LDLDIRECT:2 in the last 72 hours No results found for this basename: TSH,T4TOTAL,FREET3,T3FREE,THYROIDAB in the last 72 hours  Basename 07/27/11 2338  VITAMINB12 --  FOLATE --  FERRITIN --  TIBC --  IRON --  RETICCTPCT 7.6*    Micro Results: No results found for this or any previous visit (from the past 240 hour(s)).  Studies/Results: Dg Abd 1 View  07/28/2011  *RADIOLOGY REPORT*  Clinical Data: Abdominal pain and distension.  ABDOMEN - 1 VIEW  Comparison: CT of the abdomen and pelvis performed 09/03/2003  Findings: The visualized bowel gas pattern is unremarkable. Scattered air and stool filled loops of colon are seen; no abnormal dilatation of small bowel loops is seen to suggest small bowel obstruction.  No free intra-abdominal air is identified, though evaluation for free air is limited on a single supine view.  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 There is chronic calcification of the spleen.  Clips are noted within the right upper quadrant, reflecting prior cholecystectomy.  IMPRESSION:  1.  Unremarkable bowel gas pattern; no free intra-abdominal air seen. 2.  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  Original Report Authenticated By: Santa Lighter, M.D.   Dg Chest Portable 1 View  07/28/2011  *RADIOLOGY REPORT*  Clinical Data: Chest pain and abdominal pain; shortness of breath.  PORTABLE CHEST - 1 VIEW  Comparison: Chest radiograph performed 08/05/2010  Findings: The lungs are well-aerated.  Mild medial right basilar airspace opacity raises question for mild pneumonia.  There is no evidence of pleural effusion or pneumothorax.  The cardiomediastinal silhouette is within normal limits.  No acute osseous abnormalities are seen.  Chronic sclerotic changes are seen at both humeral  heads.  IMPRESSION: Mild medial right basilar airspace opacity raises question for mild pneumonia.  Original Report Authenticated By: Santa Lighter, M.D.   Medications: Scheduled Meds:   . sodium chloride   Intravenous STAT  . amLODipine  10 mg Oral Daily  . benazepril  20 mg Oral Daily  . cefTRIAXone (ROCEPHIN)  IV  1 g Intravenous Once  . docusate sodium  100 mg Oral BID  . heparin  5,000 Units Subcutaneous Q8H  .  HYDROmorphone (DILAUDID) injection  1 mg Intravenous Once  . insulin aspart  0-9 Units Subcutaneous TID WC  . levofloxacin  750 mg Oral QHS  . levofloxacin  750 mg Oral To Major  . pantoprazole  40 mg Oral Q1200  . senna  1 tablet Oral BID  . sodium chloride  3 mL Intravenous Q12H  . DISCONTD: amLODipine  10 mg Oral Daily  . DISCONTD: amLODipine-benazepril  1 capsule Oral Daily  . DISCONTD: azithromycin (ZITHROMAX) 500 MG IVPB  500 mg Intravenous Once  . DISCONTD: benazepril  20 mg Oral Daily   Continuous Infusions:   . sodium chloride 100 mL/hr at 07/28/11 1004   PRN Meds:.acetaminophen, acetaminophen, HYDROmorphone (DILAUDID) injection, ondansetron (ZOFRAN) IV, ondansetron, oxyCODONE-acetaminophen, sodium chloride, DISCONTD:  HYDROmorphone (DILAUDID) injection, DISCONTD: ondansetron (ZOFRAN) IV  ASSESSMENT & PLAN: Active Problems:  Sickle cell anemia  Hypertension  Diabetes mellitus  Chest pain  Leukocytosis  LBBB (left bundle branch block)   Pneumonia Patient came in with chest pain, differential will be in pneumonia and acute chest syndrome. Patient improving, with antibiotics hydration and pain medications. So pneumonia is favored. Continue antibiotics for now.  Chest pain Likely secondary to the pneumonia, no evidence of acute coronary syndrome with 3 sets of cardiac enzymes. Patient has chronic left bundle branch block. No evidence of acute chest syndrome, as patient getting better with antibiotics. Also part of it can be aches and pains from sickle cell  painful crisis.  Diabetes mellitus Home medication on hold, on SSI, diabetic diet.  HTN Continue home medications, patient is on Lotrel.   LOS: 1 day   Alexandra Posadas A 07/28/2011, 5:20 PM

## 2011-07-28 NOTE — ED Notes (Signed)
Dr Rolene Arbour notified of IV infiltration.  IV team notified.  Orders for PICC line in am.  PO meds to be given for antibiotics.

## 2011-07-29 LAB — CBC
HCT: 21.9 % — ABNORMAL LOW (ref 39.0–52.0)
Hemoglobin: 7.2 g/dL — ABNORMAL LOW (ref 13.0–17.0)
MCHC: 32.9 g/dL (ref 30.0–36.0)
MCV: 68.4 fL — ABNORMAL LOW (ref 78.0–100.0)
WBC: 16.3 10*3/uL — ABNORMAL HIGH (ref 4.0–10.5)

## 2011-07-29 LAB — RETICULOCYTES
RBC.: 3.2 MIL/uL — ABNORMAL LOW (ref 4.22–5.81)
Retic Count, Absolute: 233.6 10*3/uL — ABNORMAL HIGH (ref 19.0–186.0)

## 2011-07-29 MED ORDER — FOLIC ACID 1 MG PO TABS
1.0000 mg | ORAL_TABLET | Freq: Every day | ORAL | Status: DC
  Administered 2011-07-29 – 2011-07-31 (×3): 1 mg via ORAL
  Filled 2011-07-29 (×3): qty 1

## 2011-07-29 MED ORDER — PROMETHAZINE HCL 25 MG/ML IJ SOLN
25.0000 mg | Freq: Four times a day (QID) | INTRAMUSCULAR | Status: DC | PRN
  Administered 2011-07-29: 25 mg via INTRAVENOUS
  Filled 2011-07-29: qty 1

## 2011-07-29 NOTE — Progress Notes (Signed)
DAILY PROGRESS NOTE                              [facility]  SUBJECTIVE: Feels okay no chest pain today, no shortness of breath or cough.  OBJECTIVE: BP 128/77  Pulse 76  Temp(Src) 98.5 F (36.9 C) (Oral)  Resp 20  Ht 6\' 1"  (1.854 m)  Wt 79.516 kg (175 lb 4.8 oz)  BMI 23.13 kg/m2  SpO2 96%  Intake/Output Summary (Last 24 hours) at 07/29/11 1403 Last data filed at 07/29/11 0830  Gross per 24 hour  Intake 2446.66 ml  Output   1350 ml  Net 1096.66 ml                      Weight change:  Physical Exam: General: Alert and awake oriented x3 not in any acute distress. HEENT: anicteric sclera, pupils equal reactive to light and accommodation CVS: S1-S2 heard, no murmur rubs or gallops Chest: clear to auscultation bilaterally, no wheezing rales or rhonchi Abdomen:  normal bowel sounds, soft, nontender, nondistended, no organomegaly Neuro: Cranial nerves II-XII intact, no focal neurological deficits Extremities: no cyanosis, no clubbing or edema noted bilaterally   Lab Results:  [facility] 07/28/11 0655 07/27/11 2332  NA 139 136  K 4.7 4.5  CL 101 100  CO2 23 23  GLUCOSE 174* 238*  BUN 12 12  CREATININE 1.17 1.28  CALCIUM 10.7* 10.7*  MG -- --  PHOS -- --    Basename 07/28/11 0655  AST 40*  ALT 24  ALKPHOS 96  BILITOT 1.3*  PROT 9.2*  ALBUMIN 4.8    Basename 07/28/11 0655  LIPASE 30  AMYLASE --    Basename 07/29/11 0535 07/28/11 0655 07/27/11 2332  WBC 16.3* 21.3* --  NEUTROABS -- -- 20.1*  HGB 7.2* 9.7* --  HCT 21.9* 28.8* --  MCV 68.4* 67.4* --  PLT 257 PLATELET CLUMPS NOTED ON SMEAR, UNABLE TO ESTIMATE --    Basename 07/28/11 2100 07/28/11 1300 07/28/11 0655  CKTOTAL 312* 220 197  CKMB 8.1* 4.7* 4.1*  CKMBINDEX -- -- --  TROPONINI <0.30 <0.30 <0.30   No components found with this basename: POCBNP:3 No results found for this basename: DDIMER:2 in the last 72 hours  Basename 07/28/11 1300  HGBA1C 5.0   No  results found for this basename: CHOL:2,HDL:2,LDLCALC:2,TRIG:2,CHOLHDL:2,LDLDIRECT:2 in the last 72 hours No results found for this basename: TSH,T4TOTAL,FREET3,T3FREE,THYROIDAB in the last 72 hours  Basename 07/29/11 0535 07/27/11 2338  VITAMINB12 -- --  FOLATE -- --  FERRITIN -- --  TIBC -- --  IRON -- --  RETICCTPCT 7.3* 7.6*    Micro Results: Recent Results (from the past 240 hour(s))  CULTURE, BLOOD (ROUTINE X 2)     Status: Normal (Preliminary result)   Collection Time   07/28/11  1:45 AM      Component Value Range Status Comment   Specimen Description BLOOD RIGHT ARM   Final    Special Requests BOTTLES DRAWN AEROBIC AND ANAEROBIC 10CC   Final    Culture  Setup Time [id number]   Final    Culture     Final    Value:        BLOOD CULTURE RECEIVED NO GROWTH TO DATE CULTURE WILL BE HELD FOR 5 DAYS BEFORE ISSUING A FINAL NEGATIVE REPORT   Report Status PENDING   Incomplete   CULTURE, BLOOD (ROUTINE X 2)  Status: Normal (Preliminary result)   Collection Time   07/28/11  2:00 AM      Component Value Range Status Comment   Specimen Description BLOOD RIGHT HAND   Final    Special Requests BOTTLES DRAWN AEROBIC ONLY 5CC   Final    Culture  Setup Time [id number]   Final    Culture     Final    Value:        BLOOD CULTURE RECEIVED NO GROWTH TO DATE CULTURE WILL BE HELD FOR 5 DAYS BEFORE ISSUING A FINAL NEGATIVE REPORT   Report Status PENDING   Incomplete     Studies/Results: Dg Abd 1 View  07/28/2011  *RADIOLOGY REPORT*  Clinical Data: Abdominal pain and distension.  ABDOMEN - 1 VIEW  Comparison: CT of the abdomen and pelvis performed 09/03/2003  Findings: The visualized bowel gas pattern is unremarkable. Scattered air and stool filled loops of colon are seen; no abnormal dilatation of small bowel loops is seen to suggest small bowel obstruction.  No free intra-abdominal air is identified, though evaluation for free air is limited on a single supine view.  Diffuse sclerosis of  visualized osseous structures reflects sickle osteopathy; the sacroiliac joints are unremarkable in appearance. There is chronic calcification of the spleen.  Clips are noted within the right upper quadrant, reflecting prior cholecystectomy.  IMPRESSION:  1.  Unremarkable bowel gas pattern; no free intra-abdominal air seen. 2.  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  Original Report Authenticated By: Santa Lighter, M.D.   Dg Chest Portable 1 View  07/28/2011  *RADIOLOGY REPORT*  Clinical Data: Chest pain and abdominal pain; shortness of breath.  PORTABLE CHEST - 1 VIEW  Comparison: Chest radiograph performed 08/05/2010  Findings: The lungs are well-aerated.  Mild medial right basilar airspace opacity raises question for mild pneumonia.  There is no evidence of pleural effusion or pneumothorax.  The cardiomediastinal silhouette is within normal limits.  No acute osseous abnormalities are seen.  Chronic sclerotic changes are seen at both humeral heads.  IMPRESSION: Mild medial right basilar airspace opacity raises question for mild pneumonia.  Original Report Authenticated By: Santa Lighter, M.D.   Medications: Scheduled Meds:    . amLODipine  10 mg Oral Daily  . benazepril  20 mg Oral Daily  . docusate sodium  100 mg Oral BID  . heparin  5,000 Units Subcutaneous Q8H  . insulin aspart  0-9 Units Subcutaneous TID WC  . levofloxacin  750 mg Oral QHS  . pantoprazole  40 mg Oral Q1200  . senna  1 tablet Oral BID  . sodium chloride  3 mL Intravenous Q12H   Continuous Infusions:    . sodium chloride 100 mL/hr at 07/29/11 0656   PRN Meds:.acetaminophen, acetaminophen, HYDROmorphone (DILAUDID) injection, ondansetron (ZOFRAN) IV, ondansetron, oxyCODONE-acetaminophen, sodium chloride  ASSESSMENT & PLAN: Active Problems:  Sickle cell anemia  Hypertension  Diabetes mellitus  Chest pain  Leukocytosis  LBBB (left bundle  branch block)   Pneumonia Patient came in with chest pain, differential will be in pneumonia and acute chest syndrome. Patient improving, with antibiotics hydration and pain medications. So pneumonia is favored. Continue antibiotics for now.  Chest pain Likely secondary to the pneumonia, no evidence of acute coronary syndrome with 3 sets of cardiac enzymes. Patient has chronic left bundle branch block. No evidence of acute chest syndrome, as patient getting better with antibiotics. Also part of it can be aches and pains  from sickle cell painful crisis.  Diabetes mellitus Home medication on hold, on SSI, diabetic diet.  HTN Continue home medications, patient is on Lotrel.  Sickle cell anemia Patient hemoglobin is 7. 2 dropped from over 9 yesterday. Patient does have sickle cell anemia. And his baseline is around 7. This is likely secondary to IV fluid hydration which caused hemodilution. Patient does not have any symptoms of anemia. We'll follow.   LOS: 2 days   [name] A 07/29/2011, 2:03 PM

## 2011-07-30 LAB — GLUCOSE, CAPILLARY
Glucose-Capillary: 192 mg/dL — ABNORMAL HIGH (ref 70–99)
Glucose-Capillary: 208 mg/dL — ABNORMAL HIGH (ref 70–99)

## 2011-07-30 LAB — CBC
HCT: 20.1 % — ABNORMAL LOW (ref 39.0–52.0)
HCT: 22.4 % — ABNORMAL LOW (ref 39.0–52.0)
Hemoglobin: 6.8 g/dL — CL (ref 13.0–17.0)
Hemoglobin: 7.5 g/dL — ABNORMAL LOW (ref 13.0–17.0)
MCH: 22.7 pg — ABNORMAL LOW (ref 26.0–34.0)
MCH: 22.5 pg — ABNORMAL LOW (ref 26.0–34.0)
MCHC: 33.8 g/dL (ref 30.0–36.0)
MCV: 67.3 fL — ABNORMAL LOW (ref 78.0–100.0)
RBC: 3.33 MIL/uL — ABNORMAL LOW (ref 4.22–5.81)
RDW: 17.3 % — ABNORMAL HIGH (ref 11.5–15.5)

## 2011-07-30 MED ORDER — DIPHENHYDRAMINE HCL 25 MG PO CAPS
25.0000 mg | ORAL_CAPSULE | Freq: Four times a day (QID) | ORAL | Status: DC | PRN
  Administered 2011-07-30: 25 mg via ORAL
  Filled 2011-07-30: qty 1

## 2011-07-30 NOTE — Progress Notes (Signed)
DAILY PROGRESS NOTE                              GENERAL INTERNAL MEDICINE [facility]  SUBJECTIVE: Feels much better denies any chest pain, denies shortness of breath. Has minimal lower back pain.  OBJECTIVE: BP 135/73  Pulse 90  Temp(Src) 99.6 F (37.6 C) (Oral)  Resp 18  Ht 6\' 1"  (1.854 m)  Wt 79.516 kg (175 lb 4.8 oz)  BMI 23.13 kg/m2  SpO2 95%  Intake/Output Summary (Last 24 hours) at 07/30/11 1257 Last data filed at 07/30/11 0600  Gross per 24 hour  Intake 1277.33 ml  Output   1800 ml  Net -522.67 ml                      Weight change:  Physical Exam: General: Alert and awake oriented x3 not in any acute distress. HEENT: anicteric sclera, pupils equal reactive to light and accommodation CVS: S1-S2 heard, no murmur rubs or gallops Chest: clear to auscultation bilaterally, no wheezing rales or rhonchi Abdomen:  normal bowel sounds, soft, nontender, nondistended, no organomegaly Neuro: Cranial nerves II-XII intact, no focal neurological deficits Extremities: no cyanosis, no clubbing or edema noted bilaterally   Lab Results:  [facility] 07/28/11 0655 07/27/11 2332  NA 139 136  K 4.7 4.5  CL 101 100  CO2 23 23  GLUCOSE 174* 238*  BUN 12 12  CREATININE 1.17 1.28  CALCIUM 10.7* 10.7*  MG -- --  PHOS -- --    Basename 07/28/11 0655  AST 40*  ALT 24  ALKPHOS 96  BILITOT 1.3*  PROT 9.2*  ALBUMIN 4.8    Basename 07/28/11 0655  LIPASE 30  AMYLASE --    Basename 07/30/11 0620 07/29/11 0535 07/27/11 2332  WBC 16.6* 16.3* --  NEUTROABS -- -- 20.1*  HGB 6.8* 7.2* --  HCT 20.1* 21.9* --  MCV 67.2* 68.4* --  PLT 217 257 --    Basename 07/28/11 2100 07/28/11 1300 07/28/11 0655  CKTOTAL 312* 220 197  CKMB 8.1* 4.7* 4.1*  CKMBINDEX -- -- --  TROPONINI <0.30 <0.30 <0.30   No components found with this basename: POCBNP:3 No results found for this basename: DDIMER:2 in the last 72 hours  Basename 07/28/11 1300  HGBA1C 5.0   No results found for  this basename: CHOL:2,HDL:2,LDLCALC:2,TRIG:2,CHOLHDL:2,LDLDIRECT:2 in the last 72 hours No results found for this basename: TSH,T4TOTAL,FREET3,T3FREE,THYROIDAB in the last 72 hours  Basename 07/29/11 0535 07/27/11 2338  VITAMINB12 -- --  FOLATE -- --  FERRITIN -- --  TIBC -- --  IRON -- --  RETICCTPCT 7.3* 7.6*    Micro Results: Recent Results (from the past 240 hour(s))  CULTURE, BLOOD (ROUTINE X 2)     Status: Normal (Preliminary result)   Collection Time   07/28/11  1:45 AM      Component Value Range Status Comment   Specimen Description BLOOD RIGHT ARM   Final    Special Requests BOTTLES DRAWN AEROBIC AND ANAEROBIC 10CC   Final    Culture  Setup Time [id number]   Final    Culture     Final    Value:        BLOOD CULTURE RECEIVED NO GROWTH TO DATE CULTURE WILL BE HELD FOR 5 DAYS BEFORE ISSUING Todd FINAL NEGATIVE REPORT   Report Status PENDING   Incomplete   CULTURE, BLOOD (ROUTINE X 2)  Status: Normal (Preliminary result)   Collection Time   07/28/11  2:00 AM      Component Value Range Status Comment   Specimen Description BLOOD RIGHT HAND   Final    Special Requests BOTTLES DRAWN AEROBIC ONLY 5CC   Final    Culture  Setup Time [id number]   Final    Culture     Final    Value:        BLOOD CULTURE RECEIVED NO GROWTH TO DATE CULTURE WILL BE HELD FOR 5 DAYS BEFORE ISSUING Todd FINAL NEGATIVE REPORT   Report Status PENDING   Incomplete     Studies/Results: Dg Abd 1 View  07/28/2011  *RADIOLOGY REPORT*  Clinical Data: Abdominal pain and distension.  ABDOMEN - 1 VIEW  Comparison: CT of the abdomen and pelvis performed 09/03/2003  Findings: The visualized bowel gas pattern is unremarkable. Scattered air and stool filled loops of colon are seen; no abnormal dilatation of small bowel loops is seen to suggest small bowel obstruction.  No free intra-abdominal air is identified, though evaluation for free air is limited on Todd single supine view.  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 There is chronic calcification of the spleen.  Clips are noted within the right upper quadrant, reflecting prior cholecystectomy.  IMPRESSION:  1.  Unremarkable bowel gas pattern; no free intra-abdominal air seen. 2.  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  Original Report Authenticated By: Santa Lighter, M.D.   Dg Chest Portable 1 View  07/28/2011  *RADIOLOGY REPORT*  Clinical Data: Chest pain and abdominal pain; shortness of breath.  PORTABLE CHEST - 1 VIEW  Comparison: Chest radiograph performed 08/05/2010  Findings: The lungs are well-aerated.  Mild medial right basilar airspace opacity raises question for mild pneumonia.  There is no evidence of pleural effusion or pneumothorax.  The cardiomediastinal silhouette is within normal limits.  No acute osseous abnormalities are seen.  Chronic sclerotic changes are seen at both humeral heads.  IMPRESSION: Mild medial right basilar airspace opacity raises question for mild pneumonia.  Original Report Authenticated By: Santa Lighter, M.D.   Medications: Scheduled Meds:    . amLODipine  10 mg Oral Daily  . benazepril  20 mg Oral Daily  . docusate sodium  100 mg Oral BID  . folic acid  1 mg Oral Daily  . heparin  5,000 Units Subcutaneous Q8H  . insulin aspart  0-9 Units Subcutaneous TID WC  . levofloxacin  750 mg Oral QHS  . pantoprazole  40 mg Oral Q1200  . senna  1 tablet Oral BID  . sodium chloride  3 mL Intravenous Q12H   Continuous Infusions:    . sodium chloride 20 mL/hr (07/29/11 1408)   PRN Meds:.acetaminophen, acetaminophen, HYDROmorphone (DILAUDID) injection, ondansetron (ZOFRAN) IV, ondansetron, oxyCODONE-acetaminophen, promethazine, sodium chloride  ASSESSMENT & PLAN: Active Problems:  Sickle cell anemia  Hypertension  Diabetes mellitus  Chest pain   Leukocytosis  LBBB (left bundle branch block)   Pneumonia Patient came in with chest pain, differential will be in pneumonia and acute chest syndrome. Patient improving, with antibiotics hydration and pain medications. So pneumonia is favored. Continue antibiotics for now.  Chest pain Likely secondary to the pneumonia, no evidence of acute coronary syndrome with 3 sets of cardiac enzymes. Patient has chronic left bundle branch block. No evidence of acute chest syndrome, as patient getting better with antibiotics.  Also part of it can be aches and pains from sickle cell painful crisis.  Diabetes mellitus Home medication on hold, on SSI, diabetic diet.  HTN Continue home medications, patient is on Lotrel.  Sickle cell anemia Patient hemoglobin is 7. 2 dropped from over 9 yesterday. Patient does have sickle cell anemia. And his baseline is around 7. This is likely secondary to IV fluid hydration which caused hemodilution. Patient does not have any symptoms of anemia. We'll follow. -Hemoglobin results 6.8 this morning. It was reported that was taken from the PICC line. I will repeat the CBC from peripheral stick. -Patient does not have any symptoms or signs suggesting severe anemia.   LOS: 3 days   [name] 07/30/2011, 12:57 PM

## 2011-07-30 NOTE — Progress Notes (Signed)
Lynch notified and called with the order that she will pass it on to the doctor today.  Will continue to monitor.Wilson Singer, RN

## 2011-07-30 NOTE — Progress Notes (Signed)
CRITICAL VALUE ALERT  Critical value received:  6.8  Date of notification:  07/30/11  Time of notification:  0650  Critical value read back:yes  Nurse who received alert:  mt  MD notified (1st page):  lynch  Time of first page:  0650  MD notified (2nd page):  Time of second page:  Responding MD:  lynch  Time MD responded:  [phone]

## 2011-07-30 NOTE — Progress Notes (Signed)
At approximately 23:45 on 07/29/11 Patient heart rate went up to 153.  Upon observation and assessment the patient was shivering stating his room was way too cold.  Blankets were given and the thermostat was adjusted.  His pulse came down immediately to the 90s.  Patient denied chest pain.  Per orders, MD made aware of tachy heart rate, and she stated to continue to monitor patient.  Will continue to monitor.  Latrell Potempa, Josephine Cables, RN

## 2011-07-31 LAB — CBC
HCT: 21.4 % — ABNORMAL LOW (ref 39.0–52.0)
Hemoglobin: 7.2 g/dL — ABNORMAL LOW (ref 13.0–17.0)
RBC: 3.2 MIL/uL — ABNORMAL LOW (ref 4.22–5.81)
WBC: 19.8 10*3/uL — ABNORMAL HIGH (ref 4.0–10.5)

## 2011-07-31 LAB — BASIC METABOLIC PANEL
Chloride: 102 mEq/L (ref 96–112)
GFR calc Af Amer: 61 mL/min — ABNORMAL LOW (ref >90)
Potassium: 3.8 mEq/L (ref 3.5–5.1)

## 2011-07-31 LAB — GLUCOSE, CAPILLARY
Glucose-Capillary: 199 mg/dL — ABNORMAL HIGH (ref 70–99)
Glucose-Capillary: 217 mg/dL — ABNORMAL HIGH (ref 70–99)

## 2011-07-31 MED ORDER — FOLIC ACID 1 MG PO TABS: 1.0000 mg | ORAL_TABLET | Freq: Every day | ORAL | Status: AC

## 2011-07-31 MED ORDER — LEVOFLOXACIN 750 MG PO TABS: 750.0000 mg | ORAL_TABLET | Freq: Every day | ORAL | Status: AC

## 2011-07-31 MED ORDER — HYDROCODONE-ACETAMINOPHEN 5-500 MG PO TABS: 1.0000 | ORAL_TABLET | Freq: Four times a day (QID) | ORAL | Status: AC | PRN

## 2011-07-31 NOTE — Progress Notes (Signed)
Called to remove picc line; pt being discharged to home;  While removing picc, topic of lab draw was brought up; pt mentioned he "had to be stuck in my foot for my labs;"  Pt then mentioned the dr said the "specimen would be too diluted from the picc;"   Explained to pt that iv fluids are stopped for 1 full minute, then a 5-10cc waste is drawn, then the specimen is collected;  Specimen should not be diluted; pt also said he's diabetic;  Explained to pt further that it is not good practice to "stick" pts who are diabetic due to lack of feeling/neuropathy in their feet; pt stated understanding;

## 2011-07-31 NOTE — Discharge Summary (Signed)
Patient ID: [name] MRN: [id number] DOB/AGE: [id number] 63 y.o.  Admit date: 07/27/2011 Discharge date: 07/31/2011  Primary Care Physician:  Ricke Hey, MD, MD  Discharge Diagnoses:   Present on Admission:  .Sickle cell anemia .Hypertension .Diabetes mellitus .Chest pain .Leukocytosis .LBBB (left bundle branch block)    Medication List  As of 07/31/2011 10:58 AM   TAKE these medications         amLODipine-benazepril 10-20 MG per capsule   Commonly known as: LOTREL   Take 1 capsule by mouth daily.      folic acid 1 MG tablet   Commonly known as: FOLVITE   Take 1 tablet (1 mg total) by mouth daily.      glipiZIDE 10 MG 24 hr tablet   Commonly known as: GLUCOTROL XL   Take 10 mg by mouth daily.      HYDROcodone-acetaminophen 5-500 MG per tablet   Commonly known as: VICODIN   Take 1 tablet by mouth every 6 (six) hours as needed for pain.      levofloxacin 750 MG tablet   Commonly known as: LEVAQUIN   Take 1 tablet (750 mg total) by mouth at bedtime.      metFORMIN 1000 MG tablet   Commonly known as: GLUCOPHAGE   Take 1,000 mg by mouth 2 (two) times daily with a meal.      omeprazole 20 MG capsule   Commonly known as: PRILOSEC   Take 20 mg by mouth daily.            Consults:  None  Brief H and P:   61yoM with h/o sickle cell disease, DM, HTN presents with chest pain, dizziness, diaphoresis and nausea/vomiting and found to have new CXR opacity.  The patient was admitted by [facility] a PICC was placed for IV access.  His placed on IV hydration O2 via nasal cannula and treated with pain medications. Cardiac enzymes x3 were negative.  Blood cultures are still pending but are negative thus far.  Chest pain resolved. It was felt that this instance was due primarily to pneumonia rather than acute chest syndrome. Today at the time of discharge, the patient reports that he has no chest pain, he is up in a chair appears well, and is asking to be  discharged home. His WBC count is still elevated but it appears that he has chronic leukocytosis.  Mr.  Willadsen will be discharged on 4 more days of Levaquin, and follow up with Dr. Alyson Ingles.   Physical Exam on Discharge: General: Alert, awake, oriented x3, in no acute distress. HEENT: No bruits, no goiter. Heart: Regular rate and rhythm, without murmurs, rubs, gallops. Lungs: Clear to auscultation bilaterally. Abdomen: Soft, nontender, nondistended, positive bowel sounds. Extremities: No clubbing cyanosis or edema with positive pedal pulses. Neuro: Grossly intact, nonfocal.  Filed Vitals:   07/30/11 1448 07/30/11 2105 07/31/11 0636 07/31/11 0915  BP: 134/77 146/75 162/82 148/72  Pulse: 99 92 115   Temp: 99.4 F (37.4 C) 98.4 F (36.9 C) 99.6 F (37.6 C)   TempSrc: Oral Oral Oral   Resp: 18 17 18    Height:      Weight:      SpO2: 91% 92% 92%      Intake/Output Summary (Last 24 hours) at 07/31/11 1058 Last data filed at 07/31/11 0636  Gross per 24 hour  Intake    720 ml  Output   2325 ml  Net  -1605 ml    Basic  Metabolic Panel:  Lab [id number] 0645 07/28/11 0655  NA 137 139  K 3.8 4.7  CL 102 101  CO2 28 23  GLUCOSE 163* 174*  BUN 13 12  CREATININE 1.39* 1.17  CALCIUM 10.1 10.7*  MG -- --  PHOS -- --   Liver Function Tests:  Lab 07/28/11 0655  AST 40*  ALT 24  ALKPHOS 96  BILITOT 1.3*  PROT 9.2*  ALBUMIN 4.8    Lab 07/28/11 0655  LIPASE 30  AMYLASE --   CBC:  Lab 07/31/11 0645 07/30/11 1205 07/27/11 2332  WBC 19.8* 18.7* --  NEUTROABS -- -- 20.1*  HGB 7.2* 7.5* --  HCT 21.4* 22.4* --  MCV 66.9* 67.3* --  PLT 259 230 --   Cardiac Enzymes:  Lab 07/28/11 2100 07/28/11 1300 07/28/11 0655  CKTOTAL 312* 220 197  CKMB 8.1* 4.7* 4.1*  CKMBINDEX -- -- --  TROPONINI <0.30 <0.30 <0.30   CBG:  Lab 07/31/11 0741 07/30/11 2103 07/30/11 1640 07/30/11 1140 07/30/11 0731 07/29/11 2128  GLUCAP 199* 245* 208* 192* 193* 229*   Hemoglobin A1C:  Lab  07/28/11 1300  HGBA1C 5.0   Coagulation:  Lab 07/27/11 2332  LABPROT 14.3  INR 1.09   Anemia Panel:  Lab 07/29/11 0535  VITAMINB12 --  FOLATE --  FERRITIN --  TIBC --  IRON --  RETICCTPCT 7.3*   Urine Drug Screen: Drugs of Abuse     Component Value Date/Time   LABOPIA NONE DETECTED 07/28/2011 0546   COCAINSCRNUR NONE DETECTED 07/28/2011 0546   LABBENZ NONE DETECTED 07/28/2011 0546   AMPHETMU NONE DETECTED 07/28/2011 0546   THCU NONE DETECTED 07/28/2011 0546   LABBARB NONE DETECTED 07/28/2011 0546     Significant Diagnostic Studies:  Dg Abd 1 View  07/28/2011  *RADIOLOGY REPORT*  Clinical Data: Abdominal pain and distension.  ABDOMEN - 1 VIEW    IMPRESSION:  1.  Unremarkable bowel gas pattern; no free intra-abdominal air seen. 2.  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  Original Report Authenticated By: Santa Lighter, M.D.   Dg Chest Portable 1 View  07/28/2011  *RADIOLOGY REPORT*  Clinical Data: Chest pain and abdominal pain; shortness of breath.  PORTABLE CHEST - 1 VIEW  IMPRESSION: Mild medial right basilar airspace opacity raises question for mild pneumonia.  Original Report Authenticated By: Santa Lighter, M.D.      Disposition and Follow-up:   Stable for discharge to home with close followup by primary care physician.  Discharge Orders    Future Orders Please Complete By Expires   Diet - low sodium heart healthy      Increase activity slowly      Discharge instructions      Comments:   Take folic acid daily. Follow up with Dr. Alyson Ingles in 1 week.     Follow-up Information    Schedule an appointment as soon as possible for a visit with Ricke Hey, MD.          Time spent on Discharge: 35 min.  SignedMelton Alar 07/31/2011, 10:58 AM [phone]

## 2011-07-31 NOTE — Discharge Summary (Signed)
Addendum  Patient seen and examined, chart and data base reviewed.  I agree with the above assessment and plan  For full details please see Mrs. Imogene Burn PA. Note.  Follow up with Dr Alyson Ingles for the sickle cell anemia.  Birdie Hopes [phone] 07/31/2011, 2:01 PM

## 2011-07-31 NOTE — Discharge Instructions (Signed)
DRINK PLENTY OF FLUIDS.  PARTICULARLY WATER, AT LEAST (8) 8 ounce GLASSES DAILY.

## 2011-07-31 NOTE — Progress Notes (Signed)
Inpatient Diabetes Program Recommendations  AACE/ADA: New Consensus Statement on Inpatient Glycemic Control (2009)  Target Ranges:  Prepandial:   less than 140 mg/dL      Peak postprandial:   less than 180 mg/dL (1-2 hours)      Critically ill patients:  140 - 180 mg/dL   Reason for Visit: Hyperlgycemia  Inpatient Diabetes Program Recommendations Insulin - Basal: While here, add Lantus 10 units to start. HgbA1C: Result of 5% is probably not accurate due to sickle cell crisis/anemia..probably false low  Note: Thank you, Rosita Kea, RN, CNS, Diabetes Coordinator [phone])

## 2011-07-31 NOTE — Progress Notes (Signed)
Pt discharged to home per MD order.  PICC line removed by IV team, site unremarkable.  Discharge instructions, prescriptions, and follow-up appointments given.  All questions answered and all belongings sent with pt.  Pt transported in wheelchair by volunteer and traveled home with family by private vehicle.

## 2011-08-03 LAB — CULTURE, BLOOD (ROUTINE X 2): Culture: NO GROWTH

## 2012-03-03 ENCOUNTER — Emergency Department (HOSPITAL_COMMUNITY)
Admission: EM | Admit: 2012-03-03 | Discharge: 2012-03-03 | Disposition: A | Discharge status: Home / Self Care | Payer: Medicare Other | Attending: Emergency Medicine | Admitting: Emergency Medicine

## 2012-03-03 ENCOUNTER — Encounter (HOSPITAL_COMMUNITY): Payer: Self-pay | Admitting: Emergency Medicine

## 2012-03-03 ENCOUNTER — Emergency Department (HOSPITAL_COMMUNITY): Payer: Medicare Other

## 2012-03-03 DIAGNOSIS — D571 Sickle-cell disease without crisis: Secondary | ICD-10-CM

## 2012-03-03 DIAGNOSIS — Z87891 Personal history of nicotine dependence: Secondary | ICD-10-CM | POA: Insufficient documentation

## 2012-03-03 DIAGNOSIS — E119 Type 2 diabetes mellitus without complications: Secondary | ICD-10-CM | POA: Insufficient documentation

## 2012-03-03 DIAGNOSIS — I10 Essential (primary) hypertension: Secondary | ICD-10-CM | POA: Insufficient documentation

## 2012-03-03 LAB — BASIC METABOLIC PANEL
CO2: 23 mEq/L (ref 19–32)
Calcium: 10.3 mg/dL (ref 8.4–10.5)
Glucose, Bld: 213 mg/dL — ABNORMAL HIGH (ref 70–99)
Potassium: 4.7 mEq/L (ref 3.5–5.1)
Sodium: 138 mEq/L (ref 135–145)

## 2012-03-03 LAB — CBC WITH DIFFERENTIAL/PLATELET
Eosinophils Absolute: 0.2 10*3/uL (ref 0.0–0.7)
Eosinophils Relative: 1 % (ref 0–5)
Lymphocytes Relative: 8 % — ABNORMAL LOW (ref 12–46)
MCH: 22.7 pg — ABNORMAL LOW (ref 26.0–34.0)
Monocytes Relative: 6 % (ref 3–12)
Myelocytes: 1 %
Neutro Abs: 17.3 10*3/uL — ABNORMAL HIGH (ref 1.7–7.7)
Neutrophils Relative %: 78 % — ABNORMAL HIGH (ref 43–77)
Platelets: 288 10*3/uL (ref 150–400)
RBC: 3.88 MIL/uL — ABNORMAL LOW (ref 4.22–5.81)
WBC: 20.3 10*3/uL — ABNORMAL HIGH (ref 4.0–10.5)
nRBC: 2 /100 WBC — ABNORMAL HIGH

## 2012-03-03 LAB — RETICULOCYTES
RBC.: 3.87 MIL/uL — ABNORMAL LOW (ref 4.22–5.81)
Retic Count, Absolute: 185.8 10*3/uL (ref 19.0–186.0)

## 2012-03-03 LAB — POCT I-STAT TROPONIN I

## 2012-03-03 MED ORDER — FENTANYL CITRATE 0.05 MG/ML IJ SOLN
100.0000 ug | Freq: Once | INTRAMUSCULAR | Status: AC
  Administered 2012-03-03: 100 ug via INTRAVENOUS
  Filled 2012-03-03 (×2): qty 2

## 2012-03-03 MED ORDER — FENTANYL CITRATE 0.05 MG/ML IJ SOLN
100.0000 ug | Freq: Once | INTRAMUSCULAR | Status: AC
  Administered 2012-03-03: 100 ug via INTRAVENOUS
  Filled 2012-03-03: qty 2

## 2012-03-03 MED ORDER — OXYCODONE-ACETAMINOPHEN 5-325 MG PO TABS: 1.0000 | ORAL_TABLET | Freq: Four times a day (QID) | ORAL | Status: DC | PRN

## 2012-03-03 MED ORDER — SODIUM CHLORIDE 0.9 % IV BOLUS (SEPSIS)
500.0000 mL | Freq: Once | INTRAVENOUS | Status: AC
  Administered 2012-03-03: 500 mL via INTRAVENOUS

## 2012-03-03 NOTE — ED Notes (Signed)
The patient is AOx4 and comfortable with his discharge instructions.  His ride home is present.

## 2012-03-03 NOTE — ED Notes (Addendum)
Pt took hot shower and starting having pain in lower back and chest. Stabbing pain starting in lower back going to legs.
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
## 2012-03-03 NOTE — ED Notes (Signed)
Patient transported to X-ray 

## 2012-03-03 NOTE — ED Notes (Signed)
Phlebotomy needs to draw blood; unsuccessful draw in triage.

## 2012-03-10 ENCOUNTER — Emergency Department (HOSPITAL_COMMUNITY)
Admission: EM | Admit: 2012-03-10 | Discharge: 2012-03-10 | Disposition: A | Discharge status: Home / Self Care | Payer: Medicare Other | Attending: Emergency Medicine | Admitting: Emergency Medicine

## 2012-03-10 ENCOUNTER — Encounter (HOSPITAL_COMMUNITY): Payer: Self-pay | Admitting: Emergency Medicine

## 2012-03-10 DIAGNOSIS — I10 Essential (primary) hypertension: Secondary | ICD-10-CM | POA: Insufficient documentation

## 2012-03-10 DIAGNOSIS — E119 Type 2 diabetes mellitus without complications: Secondary | ICD-10-CM | POA: Insufficient documentation

## 2012-03-10 DIAGNOSIS — R51 Headache: Secondary | ICD-10-CM | POA: Insufficient documentation

## 2012-03-10 DIAGNOSIS — D5700 Hb-SS disease with crisis, unspecified: Secondary | ICD-10-CM | POA: Insufficient documentation

## 2012-03-10 DIAGNOSIS — Z79899 Other long term (current) drug therapy: Secondary | ICD-10-CM | POA: Insufficient documentation

## 2012-03-10 DIAGNOSIS — R42 Dizziness and giddiness: Secondary | ICD-10-CM | POA: Insufficient documentation

## 2012-03-10 LAB — BASIC METABOLIC PANEL
BUN: 15 mg/dL (ref 6–23)
Calcium: 10.4 mg/dL (ref 8.4–10.5)
GFR calc Af Amer: 59 mL/min — ABNORMAL LOW (ref >90)
GFR calc non Af Amer: 51 mL/min — ABNORMAL LOW (ref >90)
Potassium: 4.7 mEq/L (ref 3.5–5.1)

## 2012-03-10 LAB — CBC WITH DIFFERENTIAL/PLATELET
Basophils Absolute: 0.2 10*3/uL — ABNORMAL HIGH (ref 0.0–0.1)
Eosinophils Absolute: 0 10*3/uL (ref 0.0–0.7)
MCH: 22.4 pg — ABNORMAL LOW (ref 26.0–34.0)
MCHC: 33.5 g/dL (ref 30.0–36.0)
Monocytes Absolute: 2 10*3/uL — ABNORMAL HIGH (ref 0.1–1.0)
Neutrophils Relative %: 79 % — ABNORMAL HIGH (ref 43–77)
Platelets: 185 10*3/uL (ref 150–400)
RBC: 3.52 MIL/uL — ABNORMAL LOW (ref 4.22–5.81)
RDW: 17.2 % — ABNORMAL HIGH (ref 11.5–15.5)

## 2012-03-10 LAB — RETICULOCYTES: Retic Ct Pct: 8 % — ABNORMAL HIGH (ref 0.4–3.1)

## 2012-03-10 MED ORDER — HYDROMORPHONE HCL PF 2 MG/ML IJ SOLN
2.0000 mg | Freq: Once | INTRAMUSCULAR | Status: AC
  Administered 2012-03-10: 2 mg via INTRAVENOUS
  Filled 2012-03-10: qty 1

## 2012-03-10 MED ORDER — SODIUM CHLORIDE 0.9 % IV BOLUS (SEPSIS)
1000.0000 mL | Freq: Once | INTRAVENOUS | Status: AC
  Administered 2012-03-10: 1000 mL via INTRAVENOUS

## 2012-03-10 MED ORDER — ONDANSETRON HCL 4 MG/2ML IJ SOLN
4.0000 mg | Freq: Once | INTRAMUSCULAR | Status: AC
  Administered 2012-03-10: 4 mg via INTRAVENOUS
  Filled 2012-03-10: qty 2

## 2012-03-10 NOTE — ED Notes (Signed)
Pt contact [name]

## 2012-03-10 NOTE — ED Notes (Signed)
Pt c/o HA and dizziness upon waking this am with some diaphoresis upon waking

## 2012-03-10 NOTE — ED Provider Notes (Signed)
History     CSN: [id number]  Arrival date & time 03/10/12  1208   First MD Initiated Contact with Patient 03/10/12 1507      Chief Complaint  Patient presents with  . Dizziness  . Headache    (Consider location/radiation/quality/duration/timing/severity/associated sxs/prior treatment) Patient is a 61 y.o. male presenting with headaches and sickle cell pain.  Headache   Sickle Cell Pain Crisis  This is a recurrent problem. The current episode started today. The onset was sudden. The problem occurs occasionally. The problem has been rapidly worsening. The pain is associated with an unknown factor. Pain location: low back, headache, both legs. Site of pain is localized in bone. The pain is severe. Nothing relieves the symptoms. The symptoms are not relieved by one or more prescription drugs. Associated symptoms include headaches. Pertinent negatives include no chest pain, no blurred vision, no loss of sensation, no tingling, no weakness, no cough and no difficulty breathing.    Past Medical History  Diagnosis Date  . Sickle cell anemia     Hgb SS disease   . Diabetes mellitus   . Hypertension     Past Surgical History  Procedure Date  . Colon surgery     Colon cancer surgery, removed small amt not full colectomy  . Cholecystectomy   . Leg surgery     For ? gangrene after running into barbwire fence at 61yo    Family History  Problem Relation Age of Onset  . Venous thrombosis Mother     Dec. of blood clot   . Throat cancer Father     Deceased of throat cancer    History  Substance Use Topics  . Smoking status: Former Smoker -- 0.5 packs/day for 20 years  . Smokeless tobacco: Not on file  . Alcohol Use: No     Former drinker      Review of Systems  Eyes: Negative for blurred vision.  Respiratory: Negative for cough.   Cardiovascular: Negative for chest pain.  Neurological: Positive for headaches. Negative for tingling and weakness.  All other systems reviewed  and are negative.    Allergies  Morphine and related  Home Medications   Current Outpatient Rx  Name Route Sig Dispense Refill  . AMLODIPINE BESY-BENAZEPRIL HCL 10-20 MG PO CAPS Oral Take 1 capsule by mouth daily.    Marland Kitchen FOLIC ACID 1 MG PO TABS Oral Take 1 tablet (1 mg total) by mouth daily.    Marland Kitchen GLIPIZIDE ER 10 MG PO TB24 Oral Take 10 mg by mouth daily.    Marland Kitchen METFORMIN HCL 1000 MG PO TABS Oral Take 1,000 mg by mouth 2 (two) times daily with a meal.    . OXYCODONE-ACETAMINOPHEN 5-325 MG PO TABS Oral Take 1-2 tablets by mouth every 6 (six) hours as needed.    Marland Kitchen OMEPRAZOLE 20 MG PO CPDR Oral Take 20 mg by mouth daily.      BP 145/76  Pulse 75  Temp 98.8 F (37.1 C) (Oral)  Resp 18  SpO2 100%  Physical Exam  Nursing note and vitals reviewed. Constitutional: He is oriented to person, place, and time. He appears well-developed and well-nourished.       Appears uncomfortable.  HENT:  Head: Normocephalic and atraumatic.  Right Ear: External ear normal.  Left Ear: External ear normal.  Mouth/Throat: Oropharynx is clear and moist.       TM's clear bilaterally.  Eyes: EOM are normal. Pupils are equal, round, and reactive to light.  Neck: Neck supple.  Cardiovascular: Normal rate and regular rhythm.   No murmur heard. Pulmonary/Chest: Effort normal and breath sounds normal. No respiratory distress.  Abdominal: Soft. Bowel sounds are normal. He exhibits no distension. There is no tenderness.  Musculoskeletal: Normal range of motion. He exhibits no edema.  Lymphadenopathy:    He has no cervical adenopathy.  Neurological: He is alert and oriented to person, place, and time. No cranial nerve deficit. He exhibits normal muscle tone. Coordination normal.  Skin: Skin is warm and dry.    ED Course  Procedures (including critical care time)  Labs Reviewed  GLUCOSE, CAPILLARY - Abnormal; Notable for the following:    Glucose-Capillary 173 (*)     All other components within normal  limits  CBC WITH DIFFERENTIAL  BASIC METABOLIC PANEL  CBC WITH DIFFERENTIAL  RETICULOCYTES   No results found.   No diagnosis found.    MDM  Patient feeling better after meds.  Labs consistent with Sickle Crisis.  Will discharge, to return prn if he worsens.        Veryl Speak, MD 03/10/12 [phone]

## 2012-03-10 NOTE — ED Notes (Signed)
Alert, NAD, calm, interactive, skin W&D, resp e/u, speaking in clear complete sentences. (Denies: pain, sob or nausea)

## 2012-03-10 NOTE — ED Notes (Signed)
Pt c/o increased pain in head and pain in legs and back that feels like sickle cell crisis

## 2012-03-10 NOTE — ED Notes (Signed)
IV team paged for pt.

## 2012-04-09 ENCOUNTER — Encounter (HOSPITAL_COMMUNITY): Payer: Self-pay | Admitting: *Deleted

## 2012-04-09 ENCOUNTER — Emergency Department (HOSPITAL_COMMUNITY): Payer: Medicare Other

## 2012-04-09 ENCOUNTER — Observation Stay (HOSPITAL_COMMUNITY)
Admission: EM | Admit: 2012-04-09 | Discharge: 2012-04-15 | Disposition: A | Discharge status: Home / Self Care | Payer: Medicare Other | Attending: Internal Medicine | Admitting: Internal Medicine

## 2012-04-09 DIAGNOSIS — K560 Paralytic ileus: Secondary | ICD-10-CM | POA: Insufficient documentation

## 2012-04-09 DIAGNOSIS — I10 Essential (primary) hypertension: Secondary | ICD-10-CM | POA: Diagnosis present

## 2012-04-09 DIAGNOSIS — R079 Chest pain, unspecified: Secondary | ICD-10-CM | POA: Diagnosis present

## 2012-04-09 DIAGNOSIS — E1129 Type 2 diabetes mellitus with other diabetic kidney complication: Secondary | ICD-10-CM | POA: Diagnosis present

## 2012-04-09 DIAGNOSIS — F1410 Cocaine abuse, uncomplicated: Secondary | ICD-10-CM | POA: Insufficient documentation

## 2012-04-09 DIAGNOSIS — Z23 Encounter for immunization: Secondary | ICD-10-CM | POA: Insufficient documentation

## 2012-04-09 DIAGNOSIS — I447 Left bundle-branch block, unspecified: Secondary | ICD-10-CM | POA: Diagnosis present

## 2012-04-09 DIAGNOSIS — E119 Type 2 diabetes mellitus without complications: Secondary | ICD-10-CM

## 2012-04-09 DIAGNOSIS — R110 Nausea: Secondary | ICD-10-CM | POA: Insufficient documentation

## 2012-04-09 DIAGNOSIS — R42 Dizziness and giddiness: Secondary | ICD-10-CM | POA: Insufficient documentation

## 2012-04-09 DIAGNOSIS — D72829 Elevated white blood cell count, unspecified: Secondary | ICD-10-CM | POA: Diagnosis present

## 2012-04-09 DIAGNOSIS — R109 Unspecified abdominal pain: Secondary | ICD-10-CM | POA: Insufficient documentation

## 2012-04-09 DIAGNOSIS — K567 Ileus, unspecified: Secondary | ICD-10-CM | POA: Diagnosis present

## 2012-04-09 DIAGNOSIS — J189 Pneumonia, unspecified organism: Secondary | ICD-10-CM | POA: Diagnosis not present

## 2012-04-09 DIAGNOSIS — D5700 Hb-SS disease with crisis, unspecified: Principal | ICD-10-CM | POA: Diagnosis present

## 2012-04-09 LAB — CBC WITH DIFFERENTIAL/PLATELET
Basophils Absolute: 0.3 10*3/uL — ABNORMAL HIGH (ref 0.0–0.1)
HCT: 28.1 % — ABNORMAL LOW (ref 39.0–52.0)
Lymphs Abs: 3.1 10*3/uL (ref 0.7–4.0)
MCH: 22.6 pg — ABNORMAL LOW (ref 26.0–34.0)
MCV: 68.4 fL — ABNORMAL LOW (ref 78.0–100.0)
Monocytes Absolute: 1.8 10*3/uL — ABNORMAL HIGH (ref 0.1–1.0)
Neutro Abs: 20.1 10*3/uL — ABNORMAL HIGH (ref 1.7–7.7)
Platelets: 270 10*3/uL (ref 150–400)
RDW: 16.8 % — ABNORMAL HIGH (ref 11.5–15.5)

## 2012-04-09 LAB — RETICULOCYTES: Retic Count, Absolute: 345.2 10*3/uL — ABNORMAL HIGH (ref 19.0–186.0)

## 2012-04-09 LAB — POCT I-STAT TROPONIN I
Troponin i, poc: 0.02 ng/mL (ref 0.00–0.08)
Troponin i, poc: 0.01 ng/mL (ref 0.00–0.08)

## 2012-04-09 LAB — BASIC METABOLIC PANEL
BUN: 11 mg/dL (ref 6–23)
Chloride: 97 mEq/L (ref 96–112)
Creatinine, Ser: 1.35 mg/dL (ref 0.50–1.35)
GFR calc Af Amer: 63 mL/min — ABNORMAL LOW (ref >90)
GFR calc non Af Amer: 54 mL/min — ABNORMAL LOW (ref >90)
Glucose, Bld: 235 mg/dL — ABNORMAL HIGH (ref 70–99)

## 2012-04-09 MED ORDER — METFORMIN HCL 500 MG PO TABS: 1000.0000 mg | ORAL_TABLET | Freq: Two times a day (BID) | ORAL | Status: DC

## 2012-04-09 MED ORDER — SODIUM CHLORIDE 0.9 % IV SOLN
Freq: Once | INTRAVENOUS | Status: AC
  Administered 2012-04-09: 16:00:00 via INTRAVENOUS

## 2012-04-09 MED ORDER — SODIUM CHLORIDE 0.9 % IV BOLUS (SEPSIS)
1000.0000 mL | Freq: Once | INTRAVENOUS | Status: AC
  Administered 2012-04-09: 1000 mL via INTRAVENOUS

## 2012-04-09 MED ORDER — KETOROLAC TROMETHAMINE 15 MG/ML IJ SOLN
15.0000 mg | Freq: Four times a day (QID) | INTRAMUSCULAR | Status: DC
  Administered 2012-04-09 – 2012-04-11 (×6): 15 mg via INTRAVENOUS
  Filled 2012-04-09 (×11): qty 1

## 2012-04-09 MED ORDER — SODIUM CHLORIDE 0.9 % IV SOLN
INTRAVENOUS | Status: DC
  Administered 2012-04-09 – 2012-04-12 (×6): via INTRAVENOUS

## 2012-04-09 MED ORDER — PANTOPRAZOLE SODIUM 40 MG PO TBEC
40.0000 mg | DELAYED_RELEASE_TABLET | Freq: Every day | ORAL | Status: DC
  Administered 2012-04-10 – 2012-04-15 (×6): 40 mg via ORAL
  Filled 2012-04-09 (×7): qty 1

## 2012-04-09 MED ORDER — DOCUSATE SODIUM 100 MG PO CAPS
100.0000 mg | ORAL_CAPSULE | Freq: Two times a day (BID) | ORAL | Status: DC
  Administered 2012-04-09 – 2012-04-15 (×12): 100 mg via ORAL
  Filled 2012-04-09 (×13): qty 1

## 2012-04-09 MED ORDER — IBUPROFEN 800 MG PO TABS: 800.0000 mg | ORAL_TABLET | Freq: Three times a day (TID) | ORAL | Status: DC

## 2012-04-09 MED ORDER — GLIPIZIDE ER 10 MG PO TB24
10.0000 mg | ORAL_TABLET | Freq: Every day | ORAL | Status: DC
  Filled 2012-04-09: qty 1

## 2012-04-09 MED ORDER — HYDROCODONE-ACETAMINOPHEN 5-325 MG PO TABS
1.0000 | ORAL_TABLET | ORAL | Status: DC | PRN
  Administered 2012-04-09 – 2012-04-15 (×11): 2 via ORAL
  Filled 2012-04-09 (×11): qty 2

## 2012-04-09 MED ORDER — HYDROMORPHONE HCL PF 1 MG/ML IJ SOLN
1.0000 mg | Freq: Once | INTRAMUSCULAR | Status: AC
  Administered 2012-04-09: 1 mg via INTRAVENOUS
  Filled 2012-04-09: qty 1

## 2012-04-09 MED ORDER — FOLIC ACID 1 MG PO TABS
1.0000 mg | ORAL_TABLET | Freq: Every day | ORAL | Status: DC
  Administered 2012-04-10 – 2012-04-15 (×6): 1 mg via ORAL
  Filled 2012-04-09 (×6): qty 1

## 2012-04-09 MED ORDER — METFORMIN HCL 500 MG PO TABS
1000.0000 mg | ORAL_TABLET | Freq: Two times a day (BID) | ORAL | Status: DC
  Administered 2012-04-10 – 2012-04-15 (×11): 1000 mg via ORAL
  Filled 2012-04-09 (×14): qty 2

## 2012-04-09 MED ORDER — MORPHINE SULFATE 2 MG/ML IJ SOLN
1.0000 mg | INTRAMUSCULAR | Status: DC | PRN
  Administered 2012-04-09: 3 mg via INTRAVENOUS
  Administered 2012-04-09: 2 mg via INTRAVENOUS
  Filled 2012-04-09: qty 1
  Filled 2012-04-09: qty 2

## 2012-04-09 MED ORDER — AMITRIPTYLINE HCL 25 MG PO TABS
25.0000 mg | ORAL_TABLET | Freq: Every day | ORAL | Status: DC
  Administered 2012-04-09 – 2012-04-14 (×6): 25 mg via ORAL
  Filled 2012-04-09 (×7): qty 1

## 2012-04-09 MED ORDER — HYDROMORPHONE HCL PF 1 MG/ML IJ SOLN
1.0000 mg | Freq: Once | INTRAMUSCULAR | Status: AC
  Administered 2012-04-09: 1 mg via INTRAMUSCULAR
  Filled 2012-04-09: qty 1

## 2012-04-09 MED ORDER — ZOLPIDEM TARTRATE 5 MG PO TABS: 5.0000 mg | ORAL_TABLET | Freq: Every evening | ORAL | Status: DC | PRN

## 2012-04-09 MED ORDER — HEPARIN SODIUM (PORCINE) 5000 UNIT/ML IJ SOLN
5000.0000 [IU] | Freq: Three times a day (TID) | INTRAMUSCULAR | Status: DC
  Administered 2012-04-09 – 2012-04-15 (×17): 5000 [IU] via SUBCUTANEOUS
  Filled 2012-04-09 (×20): qty 1

## 2012-04-09 MED ORDER — HYDROMORPHONE HCL PF 1 MG/ML IJ SOLN: 1.0000 mg | Freq: Once | INTRAMUSCULAR | Status: DC

## 2012-04-09 MED ORDER — SENNA 8.6 MG PO TABS
1.0000 | ORAL_TABLET | Freq: Two times a day (BID) | ORAL | Status: DC
  Administered 2012-04-09 – 2012-04-15 (×10): 8.6 mg via ORAL
  Filled 2012-04-09 (×13): qty 1

## 2012-04-09 NOTE — ED Notes (Signed)
Attempted to gain IV access, unsuccessful. Paged IV team. 

## 2012-04-09 NOTE — ED Notes (Signed)
Pt reports morphine causes him to itch but would still like the medication.

## 2012-04-09 NOTE — Progress Notes (Signed)
Pt admitted to unit 6700, room 6708. Pt alert and oriented. Pt c/o generalized pain 10/10. Pt oriented to unit. Bed in lowest position. Call bell within reach. Will continue to monitor.

## 2012-04-09 NOTE — ED Notes (Signed)
Please call wife, [name] @ [phone] when patient goes upstairs.

## 2012-04-09 NOTE — ED Notes (Signed)
Pt reports he had weird fumes in his house this morning that caused him to go into a sickle cell crisis, pt reports he is in pain in all of his joints and in his chest.

## 2012-04-09 NOTE — ED Provider Notes (Signed)
History     CSN: [id number]  Arrival date & time 04/09/12  1038   First MD Initiated Contact with Patient 04/09/12 1046      Chief Complaint  Patient presents with  . Dizziness  . Nausea  . Sickle Cell Pain Crisis    (Consider location/radiation/quality/duration/timing/severity/associated sxs/prior treatment) HPI Comments: Presents with sickle cell crisis that is identical and typical to his prior sickle cell crises.  Patient is a 61 y.o. male presenting with sickle cell pain. The history is provided by the patient. No language interpreter was used.  Sickle Cell Pain Crisis  This is a recurrent problem. The current episode started today. The onset was gradual. The problem occurs rarely. The problem has been gradually worsening. The pain is associated with cold exposure. The pain location is generalized. Site of pain is localized in bone and a joint. The pain is similar to prior episodes. The pain is severe. Nothing relieves the symptoms. Associated symptoms include chest pain, nausea, back pain, joint pain and difficulty breathing (mild SOB). Pertinent negatives include no abdominal pain, no constipation, no diarrhea, no vomiting, no dysuria, no headaches, no sore throat, no neck pain, no neck stiffness, no cough, no rash and no eye discharge. There is no swelling present. He has been behaving normally. He has been eating and drinking normally. Urine output has been normal. The last void occurred less than 6 hours ago. His past medical history does not include chronic back pain or chronic pain. There is no history of acute chest syndrome. There have been no frequent pain crises. He has received no recent medical care.    Past Medical History  Diagnosis Date  . Sickle cell anemia     Hgb SS disease   . Diabetes mellitus   . Hypertension     Past Surgical History  Procedure Date  . Colon surgery     Colon cancer surgery, removed small amt not full colectomy  . Cholecystectomy   .  Leg surgery     For ? gangrene after running into barbwire fence at 61yo    Family History  Problem Relation Age of Onset  . Venous thrombosis Mother     Dec. of blood clot   . Throat cancer Father     Deceased of throat cancer    History  Substance Use Topics  . Smoking status: Former Smoker -- 0.5 packs/day for 20 years  . Smokeless tobacco: Not on file  . Alcohol Use: No     Comment: Former drinker      Review of Systems  Constitutional: Negative for diaphoresis, activity change and appetite change.  HENT: Negative for sore throat and neck pain.   Eyes: Negative for discharge and visual disturbance.  Respiratory: Positive for shortness of breath (2/2 chest pain). Negative for cough and choking.   Cardiovascular: Positive for chest pain. Negative for leg swelling.  Gastrointestinal: Positive for nausea. Negative for vomiting, abdominal pain, diarrhea and constipation.  Genitourinary: Negative for dysuria and difficulty urinating.  Musculoskeletal: Positive for back pain, joint pain and arthralgias (diffuse). Negative for joint swelling.  Skin: Negative for color change, pallor and rash.  Neurological: Negative for dizziness, speech difficulty, light-headedness and headaches.  Psychiatric/Behavioral: Negative for behavioral problems and agitation.  All other systems reviewed and are negative.    Allergies  Morphine and related  Home Medications   Current Outpatient Rx  Name  Route  Sig  Dispense  Refill  . AMLODIPINE BESY-BENAZEPRIL HCL 10-20  MG PO CAPS   Oral   Take 1 capsule by mouth daily.         Marland Kitchen FOLIC ACID 1 MG PO TABS   Oral   Take 1 tablet (1 mg total) by mouth daily.         Marland Kitchen GLIPIZIDE ER 10 MG PO TB24   Oral   Take 10 mg by mouth daily.         Marland Kitchen METFORMIN HCL 1000 MG PO TABS   Oral   Take 1,000 mg by mouth 2 (two) times daily with a meal.         . OMEPRAZOLE 20 MG PO CPDR   Oral   Take 20 mg by mouth daily.         .  OXYCODONE-ACETAMINOPHEN 5-325 MG PO TABS   Oral   Take 1-2 tablets by mouth every 6 (six) hours as needed. For pain           BP 134/84  Pulse 66  Temp 97.8 F (36.6 C) (Oral)  Resp 18  SpO2 100%  Physical Exam  Constitutional: He appears well-developed. No distress.  HENT:  Head: Normocephalic and atraumatic.  Mouth/Throat: No oropharyngeal exudate.  Eyes: EOM are normal. Pupils are equal, round, and reactive to light. Right eye exhibits no discharge. Left eye exhibits no discharge.  Neck: Normal range of motion. Neck supple. No JVD present.  Cardiovascular: Normal rate, regular rhythm and normal heart sounds.   Pulmonary/Chest: Effort normal and breath sounds normal. No stridor. No respiratory distress. He has no wheezes. He has no rales. He exhibits tenderness (diffuse, bilat).  Abdominal: Soft. Bowel sounds are normal. There is no tenderness. There is no guarding.  Genitourinary: Penis normal.  Musculoskeletal: Normal range of motion. He exhibits tenderness (bilat shoulders, elbows, hips, knees). He exhibits no edema.  Neurological: He is alert. No cranial nerve deficit. He exhibits normal muscle tone.  Skin: Skin is warm and dry. He is not diaphoretic. No erythema. No pallor.  Psychiatric: He has a normal mood and affect. His behavior is normal. Judgment and thought content normal.    ED Course  Procedures (including critical care time)  Labs Reviewed  CBC WITH DIFFERENTIAL - Abnormal; Notable for the following:    WBC 25.8 (*)     RBC 4.11 (*)     Hemoglobin 9.3 (*)     HCT 28.1 (*)     MCV 68.4 (*)     MCH 22.6 (*)     RDW 16.8 (*)     Neutrophils Relative 78 (*)     Neutro Abs 20.1 (*)     Monocytes Absolute 1.8 (*)     Basophils Absolute 0.3 (*)     All other components within normal limits  BASIC METABOLIC PANEL - Abnormal; Notable for the following:    Sodium 133 (*)     Glucose, Bld 235 (*)     GFR calc non Af Amer 54 (*)     GFR calc Af Amer 63 (*)       All other components within normal limits  RETICULOCYTES - Abnormal; Notable for the following:    Retic Ct Pct 8.4 (*)     RBC. 4.11 (*)     Retic Count, Manual 345.2 (*)     All other components within normal limits  POCT I-STAT TROPONIN I   Dg Chest 2 View  04/09/2012  *RADIOLOGY REPORT*  Clinical Data: Mid chest pain, sickle  cell crisis, hypertension, diabetes  CHEST - 2 VIEW  Comparison: 03/03/2012  Findings: Normal heart size, mediastinal contours, and pulmonary vascularity. Scarring lower right chest appears stable. Minimal chronic peribronchial thickening. No acute infiltrate, pleural effusion or pneumothorax. Diffuse osteosclerosis compatible with sickle cell disease.  IMPRESSION: Right lung scarring and minimal chronic bronchitic changes. No acute abnormalities.   Original Report Authenticated By: Lavonia Dana, M.D.      1. Sickle cell crisis      Date: 04/09/2012  Rate: 65  Rhythm: normal sinus rhythm  QRS Axis: LAD  Intervals: normal  ST/T Wave abnormalities: normal  Conduction Disutrbances: LBBB, old  Narrative Interpretation: LBBB, no changes  Old EKG Reviewed: No significant changes noted  IV placed under US guidance. Placed in R AC, 1 attempt, successful. Flushed well. Dressing placed.  MDM  11:07 AM patient presents with sickle cell crisis that is typical to his prior crises. He states that the trigger this time was a bad smell coming from his heater in his apartment that caused him to be nauseated and did not sleep overnight, he then developed his typical sickle cell pain this morning and it has gotten worse. His pain involves all of his joints in his arms and legs his mid back and his chest. On exam he appears well but mildly uncomfortable, normal vital signs, pain is an reproducible to palpation over his chest and all joints. Afebrile. Doubt acute chest, sepsis, infectious process. Will rule out aplastic crisis by getting a reticulocyte count. Since he has risk  factors of diabetes and hypertension and age will get a troponin to evaluate for ACS although I think this is less likely since his chest pain is reproducible and it is typical of his sickle cell pain. His EKG is unchanged from prior with no signs of ischemia or infarction. Will control pain with 1 mg of Dilaudid and fluids. His nausea is better after being given Zofran by EMS. Stable at this time.  3:35 PM awaiting admission. Stable, responding to dilaudid x2 but still in pain.      Verdie Shire, MD 04/09/12 1536  Verdie Shire, MD 04/09/12 [phone]

## 2012-04-09 NOTE — ED Notes (Signed)
Pt states that he was nauseous all night due to a "funny smell" coming from his heater. Pt is dizzy and nauseous and thinks that the smell from his heater has started a sickle cell crisis. On arrival pt has generalized pain as well as dizziness and nausea. Pt received 4mg  Zofran IM and 324mg  ASA prior to arrival by EMS.

## 2012-04-09 NOTE — Progress Notes (Addendum)
[facility] History and Physical  [name] [id number] DOB: 1950/12/23 DOA: 04/09/2012  Referring physician: Dr.Henley, ED resident PCP: Ricke Hey, MD  Specialists: Thornton Papas MD  Chief Complaint: ? Sickle crises  HPI: [name] is a 61 y.o. male with known Hb SS [?] .  He has been having issues with heat in his house and the gas supply he thinks have made him nauseous and sick.  When he went outside today 04/09/12, ghe got hot, nauseated and had some nausea and "just got sick"-he got real lightheaded and had to "sit down before he fell down".  He states that this has happened before, about 2-3 weeks ago-he states that he starts and pain in his lower back/joint/arms/shoulders/knees and this states hurting and bothering him.  He states that the majority of his pain is in his chest today, across the top of the chest-he states that the chest pains he has are significant and are central chest in nature and shoots down to the lower part of his back-this is the worst it has been.  This is "the worst it has ever been" He came here 2 weeks ago and went on home with meds-the pain seemed to be relieved   Review of Systems: some nausea, no vomit-given something in ambulance for nasuea.  Appetite is deceased-had a ate some oatmeal, and had no appetite and has a dry taste in mouth [also because of the nastuy smell in the mouth], no diarrhea ?? Constipated-sometimes goes 3-4 weeks without stool!.  Last good stoolwas last friday  Past Medical History  Diagnosis Date  . Sickle cell anemia     Hgb SS disease   . Diabetes mellitus   . Hypertension    Chart Review   Multiple ED visits 10/6, 9/29-Ed visit 03/10/12-headaches/Sickle cell  Admission with Sickle cell anemia  Admission 08/04/11 c Springer crises-noted cocaine abuse that visit, Ty 2 DM  Admit 09/10/08 c CP, Chronic leukocytosis, ? AVN R humeral head  Admit CP related to cocaine abuse 09/29/07  Admit c CP 2/2 to Sickle cell  crises  Admission Hyperosmolar Nonketotic state, chronic periodontitis  Admission 04/09/04 with Vaso-occlusive crises  Admission 10/27/02 c SS vaso-occlusive crises, noted MRSA R arm PICC at that time  Admit 12/10/99 for Vaso-occlusive crises  Past Surgical History  Procedure Date  . Colon surgery     Colon cancer surgery, removed small amt not full colectomy  . Cholecystectomy   . Leg surgery     For ? gangrene after running into barbwire fence at 61yo   History   Social History  . Marital Status: Divorced    Spouse Name: N/A    Number of Children: N/A  . Years of Education: N/A   Social History Main Topics  . Smoking status: Former Smoker -- 0.5 packs/day for 20 years    Quit date: 06/05/1966  . Smokeless tobacco: None  . Alcohol Use: No     Comment: Former drinker  . Drug Use: No     Comment: smoked cocaine 4-5 days prior to admit  . Sexually Active: None   Other Topics Concern  . None   Social History Narrative   Retired Administrator, on disability. Former smoker. Living by himself, able to get around [facility]. Has a wife who checks on him but they don't live together sicne 2007Quit drinking     Allergies  Allergen Reactions  . Morphine And Related Itching and Swelling    Family History  Problem Relation Age of Onset  . Venous thrombosis Mother     Dec. of blood clot   . Throat cancer Father     Deceased of throat cancer     Prior to Admission medications   Medication Sig Start Date End Date Taking? Authorizing Provider  amLODipine-benazepril (LOTREL) 10-20 MG per capsule Take 1 capsule by mouth daily.   Yes Historical Provider, MD  folic acid (FOLVITE) 1 MG tablet Take 1 tablet (1 mg total) by mouth daily. 07/31/11 07/30/12 Yes Marianne L York, PA  glipiZIDE (GLUCOTROL XL) 10 MG 24 hr tablet Take 10 mg by mouth daily.   Yes Historical Provider, MD  metFORMIN (GLUCOPHAGE) 1000 MG tablet Take 1,000 mg by mouth 2 (two) times daily with a meal.   Yes Historical  Provider, MD  omeprazole (PRILOSEC) 20 MG capsule Take 20 mg by mouth daily.   Yes Historical Provider, MD  oxyCODONE-acetaminophen (PERCOCET/ROXICET) 5-325 MG per tablet Take 1-2 tablets by mouth every 6 (six) hours as needed. For pain 03/03/12  Yes Jasper Riling. Alvino Chapel, MD   Physical Exam: Filed Vitals:   04/09/12 1400 04/09/12 1430 04/09/12 1500 04/09/12 1518  BP: 135/99 155/91 164/93 151/90  Pulse: 75 80 95   Temp:    98.6 F (37 C)  TempSrc:    Oral  Resp: 21 12 21 16   SpO2: 99% 100% 98% 97%     General:  Alert pleasant oriented. Mild icterus  Eyes: See above. Cataract changes to right eye  ENT: Soft nontender supple no thyromegaly  Neck: No bruit, no thyromegaly  Cardiovascular: S1-S2 no murmur rub or gallop  Respiratory: Clinically clear no added sound  Abdomen: Soft nontender nondistended, midline lower abdominal scar  Skin: No swelling   Musculoskeletal: Pain in the lower back however arrested joints when patient is distracted and not as painful  Psychiatric: Euthymic  Neurologic: motor   Labs on Admission:  Basic Metabolic Panel:  Lab 123XX123 1119  NA 133*  K 4.1  CL 97  CO2 23  GLUCOSE 235*  BUN 11  CREATININE 1.35  CALCIUM 10.1  MG --  PHOS --   Liver Function Tests: No results found for this basename: AST:5,ALT:5,ALKPHOS:5,BILITOT:5,PROT:5,ALBUMIN:5 in the last 168 hours No results found for this basename: LIPASE:5,AMYLASE:5 in the last 168 hours No results found for this basename: AMMONIA:5 in the last 168 hours CBC:  Lab 04/09/12 1119  WBC 25.8*  NEUTROABS 20.1*  HGB 9.3*  HCT 28.1*  MCV 68.4*  PLT 270   Cardiac Enzymes: No results found for this basename: CKTOTAL:5,CKMB:5,CKMBINDEX:5,TROPONINI:5 in the last 168 hours  BNP (last 3 results) No results found for this basename: PROBNP:3 in the last 8760 hours CBG: No results found for this basename: GLUCAP:5 in the last 168 hours  Radiological Exams on Admission: Dg Chest 2  View  04/09/2012  *RADIOLOGY REPORT*  Clinical Data: Mid chest pain, sickle cell crisis, hypertension, diabetes  CHEST - 2 VIEW  Comparison: 03/03/2012  Findings: Normal heart size, mediastinal contours, and pulmonary vascularity. Scarring lower right chest appears stable. Minimal chronic peribronchial thickening. No acute infiltrate, pleural effusion or pneumothorax. Diffuse osteosclerosis compatible with sickle cell disease.  IMPRESSION: Right lung scarring and minimal chronic bronchitic changes. No acute abnormalities.   Original Report Authenticated By: Lavonia Dana, M.D.     EKG: Independently reviewed. Normal sinus rhythm PR interval 0.12 Estes criteria positive for LVH with repolarization abnormalities ST segment elevation was in V1 through 3 and depressions in  V4 through 6.? Right atrial enlargement. This is unchanged from prior EKGs done.  Assessment/Plan Principal Problem:  *Vasoocclusive sickle cell crisis Active Problems:  Sickle cell anemia  Hypertension  Diabetes mellitus  Chest pain  Leukocytosis  LBBB (left bundle branch block)   1. Vaso-occlusive sickle cell crisis-patient will be admitted per sickle cell orders that and will be started on step wise creation of pain medications inclusive of ibuprofen, Norco and then his pain under control morphine 1-2 or 3 mg every 2 when necessary. IV fluids will be administered at 100 cc per hour. Patient will have LFTs drawn 04/10/2012 a.m. and repeat hematocrit. I do not think the patient has a risk for acute chest syndrome or sequestration crisis at this time. Reticulocyte count done was elevated. Patient not on hydroxyurea for unclear reasons. Given the patient has not been weighed yet, we will hold this until tomorrow and dose the same. Further management per sickle cell M.D. 2. Left bundle branch block-EKG changes are not new. Would still cycle cardiac enzymes x3 overnight. Very high threshold to involve cardiology at this stage.   3. Uncontrolled hypertension-probably secondary to pain. Hydralazine 5-10 mg IV when necessary blood pressure >160/100. 4. Diabetes mellitus type 2-CBG 4 times a day a.c. at bedtime, followup with primary care physician for A1c-have kept on PO meds given likely imminent discharge in 24-48 hrs 5. Chronic leukocytosis-probably related to sickle cell trait. 6. Housing issues-patient states that there is a smell in his house that caused him to feel sick. I have asked social worker and case manager to assist him with housing if needed as he is estranged from his ex-wife and although she is supportive this will have to be looked into prior to discharge 7. Polysubstance abuse-clinical social worker input appreciated-he smoked cocaine for 5 days ago. Consultation that this potentially may be one of the reasons why he developed painful crisis  Emergency room physician has indicated that they have spoken with sickle cell service and patient will be consulted upon this admission   Code Status: Full  Family Communication: Briefly discussed plan of care with wife in room  Disposition Plan: Observation expected length of stay 24-48 hours  Time spent: 50 min  Verlon Au [facility] Pager [phone]  If 7PM-7AM, please contact night-coverage [email or web address] Password [facility] 04/09/2012, 3:57 PM

## 2012-04-10 ENCOUNTER — Observation Stay (HOSPITAL_COMMUNITY): Payer: Medicare Other

## 2012-04-10 DIAGNOSIS — E119 Type 2 diabetes mellitus without complications: Secondary | ICD-10-CM

## 2012-04-10 DIAGNOSIS — D5700 Hb-SS disease with crisis, unspecified: Principal | ICD-10-CM

## 2012-04-10 DIAGNOSIS — R079 Chest pain, unspecified: Secondary | ICD-10-CM

## 2012-04-10 DIAGNOSIS — D72829 Elevated white blood cell count, unspecified: Secondary | ICD-10-CM

## 2012-04-10 DIAGNOSIS — I10 Essential (primary) hypertension: Secondary | ICD-10-CM

## 2012-04-10 LAB — GLUCOSE, CAPILLARY
Glucose-Capillary: 195 mg/dL — ABNORMAL HIGH (ref 70–99)
Glucose-Capillary: 180 mg/dL — ABNORMAL HIGH (ref 70–99)
Glucose-Capillary: 232 mg/dL — ABNORMAL HIGH (ref 70–99)

## 2012-04-10 MED ORDER — HYDROMORPHONE HCL PF 1 MG/ML IJ SOLN
1.0000 mg | Freq: Once | INTRAMUSCULAR | Status: AC
  Administered 2012-04-10: 1 mg via INTRAVENOUS
  Filled 2012-04-10: qty 1

## 2012-04-10 MED ORDER — ONDANSETRON HCL 4 MG/2ML IJ SOLN
4.0000 mg | Freq: Four times a day (QID) | INTRAMUSCULAR | Status: DC | PRN
  Administered 2012-04-10 – 2012-04-12 (×3): 4 mg via INTRAVENOUS
  Filled 2012-04-10 (×3): qty 2

## 2012-04-10 MED ORDER — HYDROMORPHONE HCL PF 1 MG/ML IJ SOLN
1.0000 mg | INTRAMUSCULAR | Status: DC | PRN
  Administered 2012-04-10 – 2012-04-15 (×15): 1 mg via INTRAVENOUS
  Filled 2012-04-10 (×16): qty 1

## 2012-04-10 MED ORDER — POLYETHYLENE GLYCOL 3350 17 G PO PACK
17.0000 g | PACK | Freq: Every day | ORAL | Status: DC
  Administered 2012-04-10: 17 g via ORAL
  Filled 2012-04-10 (×2): qty 1

## 2012-04-10 MED ORDER — INFLUENZA VIRUS VACC SPLIT PF IM SUSP
0.5000 mL | INTRAMUSCULAR | Status: AC
  Administered 2012-04-11: 0.5 mL via INTRAMUSCULAR
  Filled 2012-04-10: qty 0.5

## 2012-04-10 MED ORDER — GLIPIZIDE ER 10 MG PO TB24
10.0000 mg | ORAL_TABLET | Freq: Every day | ORAL | Status: DC
  Administered 2012-04-10 – 2012-04-15 (×6): 10 mg via ORAL
  Filled 2012-04-10 (×7): qty 1

## 2012-04-10 NOTE — Progress Notes (Addendum)
[facility] PROGRESS NOTE  [name] [id number] DOB: Nov 21, 1950 DOA: 04/09/2012 PCP: Ricke Hey, MD  Assessment/Plan:  1. Vaso-occlusive sickle cell crisis-patient will be admitted per sickle cell orders that and will be started on step wise creation of pain medications inclusive of ibuprofen, Norco and then his pain under control morphine 1-2 or 3 mg every 2 when necessary. IV fluids will be administered at 100 cc per hour. Reticulocyte count done was elevated. Patient not on hydroxyurea for unclear reasons. Will need Fu with hematology given chronic leukocytosis as well, sickle cell MD contacted by EDP 2. Abd pain/distension: from ileus, constipation check KUB, may need to downgrade diet, add miralax 3. Left bundle branch block-EKG changes are not new. Would still cycle cardiac enzymes x3 overnight. Very high threshold to involve cardiology at this stage.  4. Uncontrolled hypertension-probably secondary to pain. Hydralazine 5-10 mg IV when necessary blood pressure >160/100. 5. Diabetes mellitus type 2-CBG 4 times a day a.c. at bedtime, followup with primary care physician for A1c-have kept on PO meds given likely imminent discharge in 24-48 hrs 6. Chronic leukocytosis-probably related to sickle cell trait. Housing issues-patient states that there is a smell in his house , Education officer, museum and case manager to assist him with housing if needed  Code Status: FULL  Disposition Plan: Home     HPI/Subjective: Chest feels ok, lower back/shoulders and lower abdomen with some pain  Objective: Filed Vitals:   04/09/12 1830 04/09/12 1900 04/09/12 1930 04/09/12 2002  BP: 147/91 151/86 159/92 153/100  Pulse: 93 91 91 90  Temp:    98.6 F (37 C)  TempSrc:    Oral  Resp: 17 16 18 18   Height:    6\' 1"  (1.854 m)  Weight:    80.7 kg (177 lb 14.6 oz)  SpO2: 100% 100% 100% 100%    Intake/Output Summary (Last 24 hours) at 04/10/12 0856 Last data filed at 04/09/12 1829  Gross  per 24 hour  Intake    360 ml  Output    600 ml  Net   -240 ml   Filed Weights   04/09/12 2002  Weight: 80.7 kg (177 lb 14.6 oz)    Exam: General: Alert pleasant oriented. Mild icterus  Eyes: See above. Cataract changes to right eye  ENT: Soft nontender supple no thyromegaly  Neck: No bruit, no thyromegaly  Cardiovascular: S1-S2 no murmur rub or gallop  Respiratory: Clinically clear no added sound  Abdomen: firm, distended, mild lower quadrant diffuse tenderness, midline lower abdominal scar  Skin: No swelling  Musculoskeletal: Pain in the lower back however arrested joints when patient is distracted and not as painful  Psychiatric: Euthymic  Neurologic: motor    Data Reviewed: Basic Metabolic Panel:  Lab 123XX123 1119  NA 133*  K 4.1  CL 97  CO2 23  GLUCOSE 235*  BUN 11  CREATININE 1.35  CALCIUM 10.1  MG --  PHOS --   Liver Function Tests: No results found for this basename: AST:5,ALT:5,ALKPHOS:5,BILITOT:5,PROT:5,ALBUMIN:5 in the last 168 hours No results found for this basename: LIPASE:5,AMYLASE:5 in the last 168 hours No results found for this basename: AMMONIA:5 in the last 168 hours CBC:  Lab 04/09/12 1119  WBC 25.8*  NEUTROABS 20.1*  HGB 9.3*  HCT 28.1*  MCV 68.4*  PLT 270   Cardiac Enzymes: No results found for this basename: CKTOTAL:5,CKMB:5,CKMBINDEX:5,TROPONINI:5 in the last 168 hours BNP (last 3 results) No results found for this basename: PROBNP:3 in the last 8760  hours CBG:  Lab 04/10/12 0810 04/09/12 1958  GLUCAP 138* 213*    Recent Results (from the past 240 hour(s))  MRSA PCR SCREENING     Status: Normal   Collection Time   04/09/12  8:10 PM      Component Value Range Status Comment   MRSA by PCR NEGATIVE  NEGATIVE Final      Studies: Dg Chest 2 View  04/09/2012  *RADIOLOGY REPORT*  Clinical Data: Mid chest pain, sickle cell crisis, hypertension, diabetes  CHEST - 2 VIEW  Comparison: 03/03/2012  Findings: Normal heart size,  mediastinal contours, and pulmonary vascularity. Scarring lower right chest appears stable. Minimal chronic peribronchial thickening. No acute infiltrate, pleural effusion or pneumothorax. Diffuse osteosclerosis compatible with sickle cell disease.  IMPRESSION: Right lung scarring and minimal chronic bronchitic changes. No acute abnormalities.   Original Report Authenticated By: Lavonia Dana, M.D.     Scheduled Meds:   . [COMPLETED] sodium chloride   Intravenous Once  . amitriptyline  25 mg Oral QHS  . docusate sodium  100 mg Oral BID  . folic acid  1 mg Oral Daily  . glipiZIDE  10 mg Oral Daily  . heparin  5,000 Units Subcutaneous Q8H  . [COMPLETED]  HYDROmorphone (DILAUDID) injection  1 mg Intramuscular Once  . [COMPLETED]  HYDROmorphone (DILAUDID) injection  1 mg Intravenous Once  . [COMPLETED]  HYDROmorphone (DILAUDID) injection  1 mg Intravenous Once  . [COMPLETED]  HYDROmorphone (DILAUDID) injection  1 mg Intravenous Once  . ibuprofen  800 mg Oral TID  . influenza  inactive virus vaccine  0.5 mL Intramuscular Tomorrow-1000  . ketorolac  15 mg Intravenous Q6H  . metFORMIN  1,000 mg Oral BID WC  . pantoprazole  40 mg Oral Daily  . senna  1 tablet Oral BID  . [COMPLETED] sodium chloride  1,000 mL Intravenous Once  . [DISCONTINUED]  HYDROmorphone (DILAUDID) injection  1 mg Intravenous Once  . [DISCONTINUED] metFORMIN  1,000 mg Oral BID WC   Continuous Infusions:   . sodium chloride 100 mL/hr at 04/09/12 2105    Principal Problem:  *Vasoocclusive sickle cell crisis Active Problems:  Sickle cell anemia  Hypertension  Diabetes mellitus  Chest pain  Leukocytosis  LBBB (left bundle branch block)    Time spent: 79min    [facility] Pager [phone]. If 8PM-8AM, please contact night-coverage at [email or web address], password [facility] 04/10/2012, 8:56 AM  LOS: 1 day

## 2012-04-10 NOTE — Progress Notes (Signed)
Inpatient Diabetes Program Recommendations  AACE/ADA: New Consensus Statement on Inpatient Glycemic Control (2013)  Target Ranges:  Prepandial:   less than 140 mg/dL      Peak postprandial:   less than 180 mg/dL (1-2 hours)      Critically ill patients:  140 - 180 mg/dL   CBGs 138, 195  Inpatient Diabetes Program Recommendations Correction (SSI): Start Sensitive scale TID Thank you  Raoul Pitch [facility] Inpatient Diabetes Coordinator [phone]

## 2012-04-10 NOTE — Progress Notes (Signed)
Utilization review completed.  

## 2012-04-11 DIAGNOSIS — K567 Ileus, unspecified: Secondary | ICD-10-CM | POA: Diagnosis present

## 2012-04-11 DIAGNOSIS — K560 Paralytic ileus: Secondary | ICD-10-CM

## 2012-04-11 LAB — PREPARE RBC (CROSSMATCH)

## 2012-04-11 LAB — BASIC METABOLIC PANEL
Chloride: 101 mEq/L (ref 96–112)
GFR calc Af Amer: 65 mL/min — ABNORMAL LOW (ref >90)
GFR calc non Af Amer: 56 mL/min — ABNORMAL LOW (ref >90)
Potassium: 4.1 mEq/L (ref 3.5–5.1)

## 2012-04-11 LAB — CBC
HCT: 20.9 % — ABNORMAL LOW (ref 39.0–52.0)
Hemoglobin: 6.9 g/dL — CL (ref 13.0–17.0)
RBC: 3.08 MIL/uL — ABNORMAL LOW (ref 4.22–5.81)
WBC: 20.5 10*3/uL — ABNORMAL HIGH (ref 4.0–10.5)

## 2012-04-11 LAB — GLUCOSE, CAPILLARY
Glucose-Capillary: 146 mg/dL — ABNORMAL HIGH (ref 70–99)
Glucose-Capillary: 223 mg/dL — ABNORMAL HIGH (ref 70–99)
Glucose-Capillary: 153 mg/dL — ABNORMAL HIGH (ref 70–99)

## 2012-04-11 LAB — ABO/RH: ABO/RH(D): AB POS

## 2012-04-11 MED ORDER — FLEET ENEMA 7-19 GM/118ML RE ENEM
1.0000 | ENEMA | Freq: Once | RECTAL | Status: AC
  Administered 2012-04-11: 1 via RECTAL
  Filled 2012-04-11: qty 1

## 2012-04-11 MED ORDER — POLYETHYLENE GLYCOL 3350 17 G PO PACK
17.0000 g | PACK | Freq: Two times a day (BID) | ORAL | Status: DC
  Administered 2012-04-11 – 2012-04-13 (×4): 17 g via ORAL
  Filled 2012-04-11 (×7): qty 1

## 2012-04-11 MED ORDER — ACETAMINOPHEN 325 MG PO TABS
650.0000 mg | ORAL_TABLET | Freq: Four times a day (QID) | ORAL | Status: DC | PRN
  Administered 2012-04-11 – 2012-04-13 (×3): 650 mg via ORAL
  Filled 2012-04-11 (×3): qty 2

## 2012-04-11 MED ORDER — DIPHENHYDRAMINE HCL 25 MG PO CAPS
25.0000 mg | ORAL_CAPSULE | Freq: Once | ORAL | Status: AC
  Administered 2012-04-11: 25 mg via ORAL
  Filled 2012-04-11: qty 1

## 2012-04-11 NOTE — Progress Notes (Signed)
Pt's temp.100.3,Dr. Broadus John has been informed,orders to proced with blood transfusion were received,keep monitoring pt. Closely.

## 2012-04-11 NOTE — Progress Notes (Signed)
[facility] PROGRESS NOTE  Rolland Bredahl Kalil [id number] DOB: 1951-04-15 DOA: 04/09/2012 PCP: Ricke Hey, MD  Assessment/Plan:  1. Vaso-occlusive sickle cell crisis-patient will be admitted per sickle cell orders that and will be started on step wise creation of pain medications inclusive of ibuprofen, Norco and then his pain under control morphine 1-2 or 3 mg every 2 when necessary. IV fluids will be administered at 100 cc per hour. Reticulocyte count done was elevated. Patient not on hydroxyurea for unclear reasons. Will need Fu with hematology given chronic leukocytosis as well. 2. Abd pain/distension: from ileus, constipation, KUB nonobstructive, downgrade diet to clears, increase miralax, fleets enema this afternoon if no BM 3. Left bundle branch block-EKG changes are not new. Would still cycle cardiac enzymes x3 overnight. Very high threshold to involve cardiology at this stage.  4. Hypertension-stable, improved. 5. Diabetes mellitus type 2-resume oral hypoglycemics, SSI 6. Chronic  leukocytosis-probably related to sickle cell trait, FU with heme as outpatient Housing issues-patient states that there is a smell in his house , social worker and case manager to assist him with housing if needed  Code Status: FULL  Disposition Plan: Home     HPI/Subjective: Chest feels ok, lower back/shoulders and lower abdomen with some pain, abdomen distended  Objective: Filed Vitals:   04/10/12 1400 04/10/12 1807 04/10/12 2109 04/11/12 0533  BP: 144/80 133/76 160/87 157/82  Pulse: 83 87 92 91  Temp: 98.1 F (36.7 C) 98.6 F (37 C) 100.2 F (37.9 C) 99.2 F (37.3 C)  TempSrc: Oral Oral Oral Oral  Resp: 18 18 18 18   Height:      Weight:   83.8 kg (184 lb 11.9 oz)   SpO2: 100% 100% 93% 92%    Intake/Output Summary (Last 24 hours) at 04/11/12 0834 Last data filed at 04/11/12 0536  Gross per 24 hour  Intake 3595.67 ml  Output   2000 ml  Net 1595.67 ml   Filed Weights   04/09/12 2002 04/10/12 2109  Weight: 80.7 kg (177 lb 14.6 oz) 83.8 kg (184 lb 11.9 oz)    Exam: General: Alert pleasant oriented. Mild icterus  Eyes: See above. Cataract changes to right eye  ENT: Soft nontender supple no thyromegaly  Neck: No bruit, no thyromegaly  Cardiovascular: S1-S2 no murmur rub or gallop  Respiratory: Clinically clear no added sound  Abdomen: firm, distended, mild lower quadrant diffuse tenderness, midline lower abdominal scar  Skin: No swelling  Musculoskeletal: Pain in the lower back however arrested joints when patient is distracted and not as painful  Psychiatric: Euthymic  Neurologic: motor    Data Reviewed: Basic Metabolic Panel:  Lab 123XX123 1119  NA 133*  K 4.1  CL 97  CO2 23  GLUCOSE 235*  BUN 11  CREATININE 1.35  CALCIUM 10.1  MG --  PHOS --   Liver Function Tests: No results found for this basename: AST:5,ALT:5,ALKPHOS:5,BILITOT:5,PROT:5,ALBUMIN:5 in the last 168 hours No results found for this basename: LIPASE:5,AMYLASE:5 in the last 168 hours No results found for this basename: AMMONIA:5 in the last 168 hours CBC:  Lab 04/09/12 1119  WBC 25.8*  NEUTROABS 20.1*  HGB 9.3*  HCT 28.1*  MCV 68.4*  PLT 270   Cardiac Enzymes: No results found for this basename: CKTOTAL:5,CKMB:5,CKMBINDEX:5,TROPONINI:5 in the last 168 hours BNP (last 3 results) No results found for this basename: PROBNP:3 in the last 8760 hours CBG:  Lab 04/11/12 0721 04/10/12 2113 04/10/12 1647 04/10/12 1149 04/10/12 0810  GLUCAP 146* 232* 180*  195* 138*    Recent Results (from the past 240 hour(s))  MRSA PCR SCREENING     Status: Normal   Collection Time   04/09/12  8:10 PM      Component Value Range Status Comment   MRSA by PCR NEGATIVE  NEGATIVE Final      Studies: Dg Chest 2 View  04/09/2012  *RADIOLOGY REPORT*  Clinical Data: Mid chest pain, sickle cell crisis, hypertension, diabetes  CHEST - 2 VIEW  Comparison: 03/03/2012  Findings: Normal heart  size, mediastinal contours, and pulmonary vascularity. Scarring lower right chest appears stable. Minimal chronic peribronchial thickening. No acute infiltrate, pleural effusion or pneumothorax. Diffuse osteosclerosis compatible with sickle cell disease.  IMPRESSION: Right lung scarring and minimal chronic bronchitic changes. No acute abnormalities.   Original Report Authenticated By: Lavonia Dana, M.D.    Dg Abd 1 View  04/10/2012  *RADIOLOGY REPORT*  Clinical Data: Abdominal distention and pain  ABDOMEN - 1 VIEW  Comparison: Abdomen film of 07/28/2011 and pelvis films of 09/11/2003  Findings: A supine view of the abdomen shows no bowel obstruction. Some bowel gas is noted throughout the colon without distention. However the bones are markedly sclerotic and irregular as noted previously, consistent with changes of sickle cell disease and bone infarction.  Surgical clips are noted in the right upper quadrant.  IMPRESSION:  1.  No bowel obstruction. 2.  Bony changes consistent with sickle cell disease.   Original Report Authenticated By: Ivar Drape, M.D.     Scheduled Meds:    . amitriptyline  25 mg Oral QHS  . docusate sodium  100 mg Oral BID  . folic acid  1 mg Oral Daily  . glipiZIDE  10 mg Oral Q breakfast  . heparin  5,000 Units Subcutaneous Q8H  . influenza  inactive virus vaccine  0.5 mL Intramuscular Tomorrow-1000  . metFORMIN  1,000 mg Oral BID WC  . pantoprazole  40 mg Oral Daily  . polyethylene glycol  17 g Oral BID  . senna  1 tablet Oral BID  . [DISCONTINUED] glipiZIDE  10 mg Oral Daily  . [DISCONTINUED] ibuprofen  800 mg Oral TID  . [DISCONTINUED] ketorolac  15 mg Intravenous Q6H  . [DISCONTINUED] polyethylene glycol  17 g Oral Daily   Continuous Infusions:    . sodium chloride 100 mL/hr at 04/10/12 2335    Principal Problem:  *Vasoocclusive sickle cell crisis Active Problems:  Sickle cell anemia  Hypertension  Diabetes mellitus  Chest pain  Leukocytosis  LBBB (left  bundle branch block)    Time spent: 19min    [facility] Pager [phone]. If 8PM-8AM, please contact night-coverage at [email or web address], password [facility] 04/11/2012, 8:34 AM  LOS: 2 days

## 2012-04-11 NOTE — Progress Notes (Signed)
Pt. Got the flee enema,he tolerated fine.pt. Had a bowel movement medium size.keep monitoring pt. Closely.

## 2012-04-11 NOTE — Progress Notes (Signed)
Received a call from the lab. With hemoglobin.= 6.9.MD. Was notified.keep monitoring pt. Closely.

## 2012-04-12 ENCOUNTER — Observation Stay (HOSPITAL_COMMUNITY): Payer: Medicare Other

## 2012-04-12 LAB — URINALYSIS, ROUTINE W REFLEX MICROSCOPIC
Glucose, UA: NEGATIVE mg/dL
Hgb urine dipstick: NEGATIVE
Ketones, ur: NEGATIVE mg/dL
Leukocytes, UA: NEGATIVE
Protein, ur: 30 mg/dL — AB
pH: 5.5 (ref 5.0–8.0)

## 2012-04-12 LAB — CBC
HCT: 24.2 % — ABNORMAL LOW (ref 39.0–52.0)
Hemoglobin: 7.9 g/dL — ABNORMAL LOW (ref 13.0–17.0)
MCH: 22.5 pg — ABNORMAL LOW (ref 26.0–34.0)
MCHC: 32.6 g/dL (ref 30.0–36.0)
RBC: 3.51 MIL/uL — ABNORMAL LOW (ref 4.22–5.81)

## 2012-04-12 LAB — GLUCOSE, CAPILLARY
Glucose-Capillary: 118 mg/dL — ABNORMAL HIGH (ref 70–99)
Glucose-Capillary: 173 mg/dL — ABNORMAL HIGH (ref 70–99)

## 2012-04-12 LAB — BASIC METABOLIC PANEL
BUN: 12 mg/dL (ref 6–23)
Calcium: 9.1 mg/dL (ref 8.4–10.5)
Creatinine, Ser: 1.34 mg/dL (ref 0.50–1.35)
GFR calc Af Amer: 64 mL/min — ABNORMAL LOW (ref >90)

## 2012-04-12 LAB — TYPE AND SCREEN
Antibody Screen: NEGATIVE
Unit division: 0

## 2012-04-12 LAB — URINE MICROSCOPIC-ADD ON

## 2012-04-12 MED ORDER — HYDRALAZINE HCL 25 MG PO TABS
25.0000 mg | ORAL_TABLET | Freq: Three times a day (TID) | ORAL | Status: DC
Start: 2012-04-12 — End: 2012-04-15
  Administered 2012-04-12 – 2012-04-15 (×10): 25 mg via ORAL
  Filled 2012-04-12 (×14): qty 1

## 2012-04-12 MED ORDER — FUROSEMIDE 10 MG/ML IJ SOLN
40.0000 mg | Freq: Two times a day (BID) | INTRAMUSCULAR | Status: DC
  Administered 2012-04-12 (×2): 40 mg via INTRAVENOUS
  Filled 2012-04-12 (×5): qty 4

## 2012-04-12 MED ORDER — PIPERACILLIN-TAZOBACTAM 3.375 G IVPB 30 MIN
3.3750 g | Freq: Once | INTRAVENOUS | Status: AC
  Administered 2012-04-12: 3.375 g via INTRAVENOUS
  Filled 2012-04-12: qty 50

## 2012-04-12 MED ORDER — VANCOMYCIN HCL IN DEXTROSE 1-5 GM/200ML-% IV SOLN
1000.0000 mg | Freq: Two times a day (BID) | INTRAVENOUS | Status: DC
  Administered 2012-04-13 – 2012-04-14 (×3): 1000 mg via INTRAVENOUS
  Filled 2012-04-12 (×3): qty 200

## 2012-04-12 MED ORDER — FLEET ENEMA 7-19 GM/118ML RE ENEM
1.0000 | ENEMA | Freq: Once | RECTAL | Status: DC
  Filled 2012-04-12: qty 1

## 2012-04-12 MED ORDER — VANCOMYCIN HCL 1000 MG IV SOLR
1500.0000 mg | Freq: Once | INTRAVENOUS | Status: AC
  Administered 2012-04-12: 1500 mg via INTRAVENOUS
  Filled 2012-04-12: qty 1500

## 2012-04-12 MED ORDER — PIPERACILLIN-TAZOBACTAM 3.375 G IVPB
3.3750 g | Freq: Three times a day (TID) | INTRAVENOUS | Status: DC
  Administered 2012-04-13 – 2012-04-14 (×4): 3.375 g via INTRAVENOUS
  Filled 2012-04-12 (×5): qty 50

## 2012-04-12 MED ORDER — LEVALBUTEROL HCL 0.63 MG/3ML IN NEBU
0.6300 mg | INHALATION_SOLUTION | Freq: Four times a day (QID) | RESPIRATORY_TRACT | Status: DC | PRN
  Filled 2012-04-12: qty 3

## 2012-04-12 MED ORDER — PIPERACILLIN-TAZOBACTAM 3.375 G IVPB 30 MIN
3.3750 g | Freq: Three times a day (TID) | INTRAVENOUS | Status: DC
  Filled 2012-04-12 (×2): qty 50

## 2012-04-12 NOTE — Progress Notes (Signed)
Pt refused fleets enema that was ordered. Talked with pt twice about the benefits of taking is as the MD ordered, but pt did not want to take it. Dr. Broadus John notified via text page. [name], [name]

## 2012-04-12 NOTE — Progress Notes (Signed)
Temp 102.7. Dr Broadus John notified via text page. PRN Tylenol given. Pt stable. Will continue to monitor. [name], [name]

## 2012-04-12 NOTE — Progress Notes (Signed)
Encourage pt to walk in room and/or hallway. Explained benefits and that it would help release gas in his abdomen. Pt stated he didn't think he could walk around at this time due to weakness and feeling bad. [name], [name]

## 2012-04-12 NOTE — Progress Notes (Addendum)
[facility] PROGRESS NOTE  [name] [id number] DOB: 15-Jul-1950 DOA: 04/09/2012 PCP: Ricke Hey, MD  Assessment/Plan:  1. Vaso-occlusive sickle cell crisis-continue supportive care with IVF, Oxygen, pain control,  Patient not on hydroxyurea for unclear reasons. Will need Fu with hematology given chronic leukocytosis as well. 2. Abd pain/distension: from ileus, constipation, KUB nonobstructive,finally starting to improve, continue clears, increase miralax, fleets enema again this afternoon, BM after enema yetserday 3. Hypertension-stable, uncontrolled, add hydralazine. 4. Diabetes mellitus type 2-resume oral hypoglycemics, SSI 5. Chronic  leukocytosis-related to sickle cell trait, FU with heme as outpatient Housing issues-patient states that there is a smell in his house , social worker and case manager to assist him with housing if needed  [place] Code Status: FULL  Disposition Plan: Home hopefully in 24-48H     HPI/Subjective: BM after enema, belly feels softer, no N/V, feels weak  Objective: Filed Vitals:   04/11/12 1720 04/11/12 1745 04/11/12 2205 04/12/12 0518  BP: 154/79 176/98 147/68 185/90  Pulse: 96 101 100 118  Temp: 99.4 F (37.4 C) 100.1 F (37.8 C) 100.2 F (37.9 C) 100.6 F (38.1 C)  TempSrc: Oral Oral Oral Oral  Resp: 20 20 18 17   Height:      Weight:   79.742 kg (175 lb 12.8 oz)   SpO2: 100%  95% 94%    Intake/Output Summary (Last 24 hours) at 04/12/12 0855 Last data filed at 04/12/12 0600  Gross per 24 hour  Intake   2925 ml  Output   1401 ml  Net   1524 ml   Filed Weights   04/09/12 2002 04/10/12 2109 04/11/12 2205  Weight: 80.7 kg (177 lb 14.6 oz) 83.8 kg (184 lb 11.9 oz) 79.742 kg (175 lb 12.8 oz)    Exam: General: Alert pleasant oriented. Mild icterus  Eyes: See above. Cataract changes to right eye  ENT: Soft nontender supple no thyromegaly  Neck: No bruit, no thyromegaly  Cardiovascular: S1-S2 no murmur rub or gallop    Respiratory: Clinically clear no added sound  Abdomen: firm, less distended, mild lower quadrant diffuse tenderness, midline lower abdominal scar  Skin: No swelling  Musculoskeletal: Pain in the lower back however arrested joints when patient is distracted and not as painful  Psychiatric: Euthymic  Neurologic: motor    Data Reviewed: Basic Metabolic Panel:  Lab [id number] 0623 04/11/12 0723 04/09/12 1119  NA 139 136 133*  K 3.7 4.1 4.1  CL 104 101 97  CO2 22 23 23   GLUCOSE 145* 159* 235*  BUN 12 14 11   CREATININE 1.34 1.32 1.35  CALCIUM 9.1 9.0 10.1  MG -- -- --  PHOS -- -- --   Liver Function Tests: No results found for this basename: AST:5,ALT:5,ALKPHOS:5,BILITOT:5,PROT:5,ALBUMIN:5 in the last 168 hours No results found for this basename: LIPASE:5,AMYLASE:5 in the last 168 hours No results found for this basename: AMMONIA:5 in the last 168 hours CBC:  Lab 04/11/12 0723 04/09/12 1119  WBC 20.5* 25.8*  NEUTROABS -- 20.1*  HGB 6.9* 9.3*  HCT 20.9* 28.1*  MCV 67.9* 68.4*  PLT PLATELET CLUMPS NOTED ON SMEAR, COUNT APPEARS ADEQUATE 270   Cardiac Enzymes: No results found for this basename: CKTOTAL:5,CKMB:5,CKMBINDEX:5,TROPONINI:5 in the last 168 hours BNP (last 3 results) No results found for this basename: PROBNP:3 in the last 8760 hours CBG:  Lab 04/12/12 0757 04/11/12 2156 04/11/12 1651 04/11/12 1148 04/11/12 0721  GLUCAP 144* 128* 153* 223* 146*    Recent Results (from the past 240 hour(s))  MRSA PCR SCREENING     Status: Normal   Collection Time   04/09/12  8:10 PM      Component Value Range Status Comment   MRSA by PCR NEGATIVE  NEGATIVE Final      Studies: Dg Abd 1 View  04/10/2012  *RADIOLOGY REPORT*  Clinical Data: Abdominal distention and pain  ABDOMEN - 1 VIEW  Comparison: Abdomen film of 07/28/2011 and pelvis films of 09/11/2003  Findings: A supine view of the abdomen shows no bowel obstruction. Some bowel gas is noted throughout the colon without  distention. However the bones are markedly sclerotic and irregular as noted previously, consistent with changes of sickle cell disease and bone infarction.  Surgical clips are noted in the right upper quadrant.  IMPRESSION:  1.  No bowel obstruction. 2.  Bony changes consistent with sickle cell disease.   Original Report Authenticated By: Ivar Drape, M.D.     Scheduled Meds:    . amitriptyline  25 mg Oral QHS  . [COMPLETED] diphenhydrAMINE  25 mg Oral Once  . docusate sodium  100 mg Oral BID  . folic acid  1 mg Oral Daily  . glipiZIDE  10 mg Oral Q breakfast  . heparin  5,000 Units Subcutaneous Q8H  . hydrALAZINE  25 mg Oral Q8H  . [COMPLETED] influenza  inactive virus vaccine  0.5 mL Intramuscular Tomorrow-1000  . metFORMIN  1,000 mg Oral BID WC  . pantoprazole  40 mg Oral Daily  . polyethylene glycol  17 g Oral BID  . senna  1 tablet Oral BID  . [COMPLETED] sodium phosphate  1 enema Rectal Once  . sodium phosphate  1 enema Rectal Once   Continuous Infusions:    . sodium chloride 100 mL/hr at 04/12/12 [id number]    Principal Problem:  *Vasoocclusive sickle cell crisis Active Problems:  Sickle cell anemia  Hypertension  Diabetes mellitus  Chest pain  Leukocytosis  LBBB (left bundle branch block)  Ileus    Time spent: 7min    [facility] Pager [phone]. If 8PM-8AM, please contact night-coverage at [email or web address], password [facility] 04/12/2012, 8:55 AM  LOS: 3 days

## 2012-04-12 NOTE — Progress Notes (Signed)
ANTIBIOTIC CONSULT NOTE - INITIAL  Pharmacy Consult for vancomycin/zosyn Indication: rule out pneumonia  Allergies  Allergen Reactions  . Morphine And Related Itching and Swelling    Patient Measurements: Height: 6\' 1"  (185.4 cm) Weight: 175 lb 12.8 oz (79.742 kg) IBW/kg (Calculated) : 79.9   Vital Signs: Temp: 102.7 F (39.3 C) (11/08 1639) Temp src: Oral (11/08 1639) BP: 187/96 mmHg (11/08 1639) Pulse Rate: 123  (11/08 1639) Intake/Output from previous day: 11/07 0701 - 11/08 0700 In: 3165 [P.O.:600; I.V.:2565] Out: 1676 [Urine:1675; Stool:1] Intake/Output from this shift: Total I/O In: -  Out: 1080 [Urine:1080]  Labs:  [facility] 04/12/12 0948 04/12/12 0623 04/11/12 0723  WBC 25.7* -- 20.5*  HGB 7.9* -- 6.9*  PLT 144* -- PLATELET CLUMPS NOTED ON SMEAR, COUNT APPEARS ADEQUATE  LABCREA -- -- --  CREATININE -- 1.34 1.32   Estimated Creatinine Clearance: 63.6 ml/min (by C-G formula based on Cr of 1.34).  Medical History: Past Medical History  Diagnosis Date  . Sickle cell anemia     Hgb SS disease   . Diabetes mellitus   . Hypertension     Medications:  Prescriptions prior to admission  Medication Sig Dispense Refill  . amLODipine-benazepril (LOTREL) 10-20 MG per capsule Take 1 capsule by mouth daily.      . folic acid (FOLVITE) 1 MG tablet Take 1 tablet (1 mg total) by mouth daily.      Marland Kitchen glipiZIDE (GLUCOTROL XL) 10 MG 24 hr tablet Take 10 mg by mouth daily.      . metFORMIN (GLUCOPHAGE) 1000 MG tablet Take 1,000 mg by mouth 2 (two) times daily with a meal.      . omeprazole (PRILOSEC) 20 MG capsule Take 20 mg by mouth daily.      Marland Kitchen oxyCODONE-acetaminophen (PERCOCET/ROXICET) 5-325 MG per tablet Take 1-2 tablets by mouth every 6 (six) hours as needed. For pain       Assessment: 61 yo male here for sickle cell crisis and noted with tmax=102.7, WBC=25.7 and CXR showing atelectasis versus infiltrates. Patient to begin vancomycin and zosyn for suspected  PNA.  Goal of Therapy:  Vancomycin trough level 15-20 mcg/ml  Plan:  -Vancomycin 1500mg  IV followed by 1000mg  Iv q12h -Zosyn 3.375gm IV q8h -Will follow renal function and vancomycin levels as needed  Hildred Laser, Pharm D 04/12/2012 5:40 PM

## 2012-04-12 NOTE — ED Provider Notes (Signed)
I saw and evaluated the patient, reviewed the resident's note and I agree with the findings and plan.  Although he appears nontoxic and has no evidence of PNA on the CXR, he does reports some chest discomfort.  He has a known hx of sickle cell disease and review of his most recent ER visits reveals has experienced pneumonia associated with a sickle cell pain crisis.  Hie still has significant pain after receiving IV dilaudid.  Plan admit for observation, serial exams, and pain mgmnt.  Perlie Mayo, MD 04/12/12 [phone]

## 2012-04-13 LAB — CBC
HCT: 23.5 % — ABNORMAL LOW (ref 39.0–52.0)
MCH: 23.4 pg — ABNORMAL LOW (ref 26.0–34.0)
MCV: 70.6 fL — ABNORMAL LOW (ref 78.0–100.0)
Platelets: 199 10*3/uL (ref 150–400)
RBC: 3.11 MIL/uL — ABNORMAL LOW (ref 4.22–5.81)
RBC: 3.33 MIL/uL — ABNORMAL LOW (ref 4.22–5.81)
RDW: 17.6 % — ABNORMAL HIGH (ref 11.5–15.5)
WBC: 21.4 10*3/uL — ABNORMAL HIGH (ref 4.0–10.5)
WBC: 19.2 10*3/uL — ABNORMAL HIGH (ref 4.0–10.5)

## 2012-04-13 LAB — GLUCOSE, CAPILLARY: Glucose-Capillary: 136 mg/dL — ABNORMAL HIGH (ref 70–99)

## 2012-04-13 MED ORDER — AMLODIPINE BESYLATE 10 MG PO TABS
10.0000 mg | ORAL_TABLET | Freq: Every day | ORAL | Status: DC
  Administered 2012-04-13 – 2012-04-15 (×3): 10 mg via ORAL
  Filled 2012-04-13 (×4): qty 1

## 2012-04-13 NOTE — Progress Notes (Signed)
[facility] PROGRESS NOTE  [name] [id number] DOB: June 26, 1950 DOA: 04/09/2012 PCP: Ricke Hey, MD  Assessment/Plan:  1. Vaso-occlusive sickle cell crisis-continue supportive care with IVF, Oxygen, pain control,  Patient not on hydroxyurea for unclear reasons. Will need Fu with hematology given chronic leukocytosis as well. 2. Abd pain/distension: from ileus, constipation, KUB nonobstructive,finally starting to improve, advance diet, miralax,  3. Health care assoc Pneumonia: continue IV Vanc/Zosyn today, improving, fevers down, change to Po 11/10 4. Hypertension-stable, uncontrolled, hydralazine add norvasc 5. Diabetes mellitus type 2-resume oral hypoglycemics, SSI 6. Chronic  leukocytosis-related to sickle cell trait, FU with heme as outpatient   AMbulate Code Status: FULL  Disposition Plan: Home hopefully in 24H     HPI/Subjective: Feels better today, BMs yesterday, breathing and belly improved  Objective: Filed Vitals:   04/12/12 1639 04/12/12 1653 04/12/12 2127 04/13/12 0608  BP: 187/96  156/86 159/88  Pulse: 123  97 101  Temp: 102.7 F (39.3 C)  99.8 F (37.7 C) 100.3 F (37.9 C)  TempSrc: Oral  Oral Oral  Resp: 18  17 16   Height:      Weight:   82.3 kg (181 lb 7 oz)   SpO2: 82% 94% 94% 91%    Intake/Output Summary (Last 24 hours) at 04/13/12 0817 Last data filed at 04/13/12 0609  Gross per 24 hour  Intake    240 ml  Output   3935 ml  Net  -3695 ml   Filed Weights   04/10/12 2109 04/11/12 2205 04/12/12 2127  Weight: 83.8 kg (184 lb 11.9 oz) 79.742 kg (175 lb 12.8 oz) 82.3 kg (181 lb 7 oz)    Exam: General: Alert pleasant oriented. Mild icterus  Eyes: See above. Cataract changes to right eye  ENT: Soft nontender supple no thyromegaly  Neck: No bruit, no thyromegaly  Cardiovascular: S1-S2 no murmur rub or gallop  Respiratory: Clinically clear no added sound  Abdomen: firm, less distended, mild lower quadrant diffuse tenderness,  midline lower abdominal scar  Skin: No swelling  Musculoskeletal: Pain in the lower back however arrested joints when patient is distracted and not as painful  Psychiatric: Euthymic  Neurologic: motor    Data Reviewed: Basic Metabolic Panel:  Lab 99991111 0623 04/11/12 0723 04/09/12 1119  NA 139 136 133*  K 3.7 4.1 4.1  CL 104 101 97  CO2 22 23 23   GLUCOSE 145* 159* 235*  BUN 12 14 11   CREATININE 1.34 1.32 1.35  CALCIUM 9.1 9.0 10.1  MG -- -- --  PHOS -- -- --   Liver Function Tests: No results found for this basename: AST:5,ALT:5,ALKPHOS:5,BILITOT:5,PROT:5,ALBUMIN:5 in the last 168 hours No results found for this basename: LIPASE:5,AMYLASE:5 in the last 168 hours No results found for this basename: AMMONIA:5 in the last 168 hours CBC:  Lab 04/13/12 0535 04/12/12 0948 04/11/12 0723 04/09/12 1119  WBC 21.4* 25.7* 20.5* 25.8*  NEUTROABS -- -- -- 20.1*  HGB 7.1* 7.9* 6.9* 9.3*  HCT 21.5* 24.2* 20.9* 28.1*  MCV 69.1* 68.9* 67.9* 68.4*  PLT 199 144* PLATELET CLUMPS NOTED ON SMEAR, COUNT APPEARS ADEQUATE 270   Cardiac Enzymes: No results found for this basename: CKTOTAL:5,CKMB:5,CKMBINDEX:5,TROPONINI:5 in the last 168 hours BNP (last 3 results) No results found for this basename: PROBNP:3 in the last 8760 hours CBG:  Lab 04/13/12 0741 04/12/12 2055 04/12/12 1652 04/12/12 1158 04/12/12 0757  GLUCAP 138* 140* 173* 118* 144*    Recent Results (from the past 240 hour(s))  MRSA PCR SCREENING  Status: Normal   Collection Time   04/09/12  8:10 PM      Component Value Range Status Comment   MRSA by PCR NEGATIVE  NEGATIVE Final      Studies: Dg Chest 2 View  04/12/2012  *RADIOLOGY REPORT*  Clinical Data: Cough, congestion.  CHEST - 2 VIEW  Comparison: 04/09/2012  Findings: Linear scarring linear scarring or atelectasis in the right mid lung.  Patchy bibasilar opacities are noted.  Small right pleural effusion.  Heart is upper limits normal in size.  No acute bony  abnormality.  IMPRESSION: Small right pleural effusion. Patchy bibasilar opacities, atelectasis versus infiltrates.  Small right effusion.   Original Report Authenticated By: Rolm Baptise, M.D.     Scheduled Meds:    . amitriptyline  25 mg Oral QHS  . docusate sodium  100 mg Oral BID  . folic acid  1 mg Oral Daily  . glipiZIDE  10 mg Oral Q breakfast  . heparin  5,000 Units Subcutaneous Q8H  . hydrALAZINE  25 mg Oral Q8H  . metFORMIN  1,000 mg Oral BID WC  . pantoprazole  40 mg Oral Daily  . piperacillin-tazobactam (ZOSYN)  IV  3.375 g Intravenous Q8H  . [COMPLETED] piperacillin-tazobactam  3.375 g Intravenous Once  . polyethylene glycol  17 g Oral BID  . senna  1 tablet Oral BID  . sodium phosphate  1 enema Rectal Once  . [COMPLETED] vancomycin  1,500 mg Intravenous Once  . vancomycin  1,000 mg Intravenous Q12H  . [DISCONTINUED] furosemide  40 mg Intravenous BID  . [DISCONTINUED] piperacillin-tazobactam  3.375 g Intravenous Q8H   Continuous Infusions:    . [DISCONTINUED] sodium chloride Stopped (04/12/12 1228)    Principal Problem:  *Vasoocclusive sickle cell crisis Active Problems:  Sickle cell anemia  Hypertension  Diabetes mellitus  Chest pain  Leukocytosis  LBBB (left bundle branch block)  Ileus    Time spent: 45min    Daley Gosse  [facility] Pager [phone]. If 8PM-8AM, please contact night-coverage at [email or web address], password [facility] 04/13/2012, 8:17 AM  LOS: 4 days

## 2012-04-14 LAB — CBC
HCT: 21.6 % — ABNORMAL LOW (ref 39.0–52.0)
Hemoglobin: 7.1 g/dL — ABNORMAL LOW (ref 13.0–17.0)
MCH: 22.8 pg — ABNORMAL LOW (ref 26.0–34.0)
MCHC: 32.9 g/dL (ref 30.0–36.0)
MCV: 69.5 fL — ABNORMAL LOW (ref 78.0–100.0)
RBC: 3.11 MIL/uL — ABNORMAL LOW (ref 4.22–5.81)

## 2012-04-14 LAB — GLUCOSE, CAPILLARY
Glucose-Capillary: 161 mg/dL — ABNORMAL HIGH (ref 70–99)
Glucose-Capillary: 166 mg/dL — ABNORMAL HIGH (ref 70–99)
Glucose-Capillary: 162 mg/dL — ABNORMAL HIGH (ref 70–99)

## 2012-04-14 MED ORDER — POLYETHYLENE GLYCOL 3350 17 G PO PACK
17.0000 g | PACK | Freq: Every day | ORAL | Status: DC
  Filled 2012-04-14 (×2): qty 1

## 2012-04-14 MED ORDER — LEVOFLOXACIN 500 MG PO TABS: 500.0000 mg | ORAL_TABLET | Freq: Every day | ORAL | Status: DC

## 2012-04-14 MED ORDER — FOLIC ACID 1 MG PO TABS: 1.0000 mg | ORAL_TABLET | Freq: Every day | ORAL | Status: DC

## 2012-04-14 MED ORDER — POLYETHYLENE GLYCOL 3350 17 G PO PACK: 17.0000 g | PACK | Freq: Every day | ORAL | Status: DC

## 2012-04-14 MED ORDER — DSS 100 MG PO CAPS: 100.0000 mg | ORAL_CAPSULE | Freq: Two times a day (BID) | ORAL | Status: DC

## 2012-04-14 MED ORDER — OXYCODONE-ACETAMINOPHEN 5-325 MG PO TABS: 1.0000 | ORAL_TABLET | Freq: Four times a day (QID) | ORAL | Status: DC | PRN

## 2012-04-15 DIAGNOSIS — J189 Pneumonia, unspecified organism: Secondary | ICD-10-CM

## 2012-04-15 LAB — BASIC METABOLIC PANEL
CO2: 27 mEq/L (ref 19–32)
Calcium: 9.5 mg/dL (ref 8.4–10.5)
Chloride: 101 mEq/L (ref 96–112)
Glucose, Bld: 130 mg/dL — ABNORMAL HIGH (ref 70–99)
Potassium: 3.3 mEq/L — ABNORMAL LOW (ref 3.5–5.1)
Sodium: 140 mEq/L (ref 135–145)

## 2012-04-15 LAB — GLUCOSE, CAPILLARY: Glucose-Capillary: 137 mg/dL — ABNORMAL HIGH (ref 70–99)

## 2012-04-15 MED ORDER — LEVOFLOXACIN 500 MG PO TABS: 500.0000 mg | ORAL_TABLET | Freq: Every day | ORAL | Status: DC

## 2012-04-15 NOTE — Progress Notes (Signed)
Discharge teaching  Reviewed the following: Pt will call office to make next appt in 1 week. Pt verbalized understanding of new medications and need for laxatives to relieve constipation r/t narcotic use. Pt verbalized he would stop laxatives if diarrhea occurs. Discussed possible triggers for sickle cell crisis and management options.  D/C home with wife in attendance. No other concerns expressed.

## 2012-04-15 NOTE — Progress Notes (Signed)
Pt remains stable. IV removed. Pt ready for DC. Pt transported via wheelchair to exit of facility. [name], [name]

## 2012-04-15 NOTE — Progress Notes (Signed)
[facility] PROGRESS NOTE  [name] [id number] DOB: 07/22/1950 DOA: 04/09/2012 PCP: Ricke Hey, MD  Assessment/Plan:  1. Vaso-occlusive sickle cell crisis-continue supportive care with IVF, Oxygen, pain control,  Patient not on hydroxyurea for unclear reasons. Will need Fu with hematology given chronic leukocytosis as well. 2. Abd pain/distension: from ileus, constipation, KUB nonobstructive,finally starting to improve, advance diet, miralax,  3. Health care assoc Pneumonia: continue IV Vanc/Zosyn today, improving, fevers down, change to Po 11/11 4. Hypertension-stable, uncontrolled, hydralazine add norvasc 5. Diabetes mellitus type 2-resume oral hypoglycemics, SSI 6. Chronic  leukocytosis-related to sickle cell trait, FU with heme as outpatient   AMbulate Code Status: FULL  Disposition Plan: Home later today or in am     HPI/Subjective: Feels better today, BMs yesterday, breathing and belly improved  Objective: Filed Vitals:   04/14/12 1549 04/14/12 1657 04/14/12 2119 04/15/12 0555  BP:  155/76 165/88 139/77  Pulse:  101 81 95  Temp: 99.8 F (37.7 C) 99.6 F (37.6 C) 98.8 F (37.1 C) 99.2 F (37.3 C)  TempSrc: Oral Oral  Oral  Resp:  18 18 18   Height:      Weight:   82.419 kg (181 lb 11.2 oz)   SpO2:  95% 96% 94%    Intake/Output Summary (Last 24 hours) at 04/15/12 0828 Last data filed at 04/15/12 0600  Gross per 24 hour  Intake    360 ml  Output      0 ml  Net    360 ml   Filed Weights   04/12/12 2127 04/13/12 2239 04/14/12 2119  Weight: 82.3 kg (181 lb 7 oz) 82.283 kg (181 lb 6.4 oz) 82.419 kg (181 lb 11.2 oz)    Exam: General: Alert pleasant oriented. Mild icterus  Eyes: See above. Cataract changes to right eye  ENT: Soft nontender supple no thyromegaly  Neck: No bruit, no thyromegaly  Cardiovascular: S1-S2 no murmur rub or gallop  Respiratory: Clinically clear no added sound  Abdomen: firm, less distended, mild lower quadrant  diffuse tenderness, midline lower abdominal scar  Skin: No swelling  Musculoskeletal: Pain in the lower back however arrested joints when patient is distracted and not as painful  Psychiatric: Euthymic  Neurologic: motor    Data Reviewed: Basic Metabolic Panel:  Lab 99991111 0623 04/11/12 0723 04/09/12 1119  NA 139 136 133*  K 3.7 4.1 4.1  CL 104 101 97  CO2 22 23 23   GLUCOSE 145* 159* 235*  BUN 12 14 11   CREATININE 1.34 1.32 1.35  CALCIUM 9.1 9.0 10.1  MG -- -- --  PHOS -- -- --   Liver Function Tests: No results found for this basename: AST:5,ALT:5,ALKPHOS:5,BILITOT:5,PROT:5,ALBUMIN:5 in the last 168 hours No results found for this basename: LIPASE:5,AMYLASE:5 in the last 168 hours No results found for this basename: AMMONIA:5 in the last 168 hours CBC:  Lab 04/14/12 0600 04/13/12 1732 04/13/12 0535 04/12/12 0948 04/11/12 0723 04/09/12 1119  WBC 18.6* 19.2* 21.4* 25.7* 20.5* --  NEUTROABS -- -- -- -- -- 20.1*  HGB 7.1* 7.8* 7.1* 7.9* 6.9* --  HCT 21.6* 23.5* 21.5* 24.2* 20.9* --  MCV 69.5* 70.6* 69.1* 68.9* 67.9* --  PLT 197 206 199 144* PLATELET CLUMPS NOTED ON SMEAR, COUNT APPEARS ADEQUATE --   Cardiac Enzymes: No results found for this basename: CKTOTAL:5,CKMB:5,CKMBINDEX:5,TROPONINI:5 in the last 168 hours BNP (last 3 results) No results found for this basename: PROBNP:3 in the last 8760 hours CBG:  Lab 04/15/12 0747 04/14/12 2122 04/14/12  1658 04/14/12 1143 04/14/12 0751  GLUCAP 137* 162* 166* 161* 149*    Recent Results (from the past 240 hour(s))  MRSA PCR SCREENING     Status: Normal   Collection Time   04/09/12  8:10 PM      Component Value Range Status Comment   MRSA by PCR NEGATIVE  NEGATIVE Final   CULTURE, BLOOD (ROUTINE X 2)     Status: Normal (Preliminary result)   Collection Time   04/12/12  4:55 PM      Component Value Range Status Comment   Specimen Description BLOOD RIGHT HAND   Final    Special Requests BOTTLES DRAWN AEROBIC ONLY 3CC    Final    Culture  Setup Time 04/13/2012 00:52   Final    Culture     Final    Value:        BLOOD CULTURE RECEIVED NO GROWTH TO DATE CULTURE WILL BE HELD FOR 5 DAYS BEFORE ISSUING A FINAL NEGATIVE REPORT   Report Status PENDING   Incomplete   CULTURE, BLOOD (ROUTINE X 2)     Status: Normal (Preliminary result)   Collection Time   04/12/12  5:03 PM      Component Value Range Status Comment   Specimen Description BLOOD RIGHT HAND   Final    Special Requests BOTTLES DRAWN AEROBIC ONLY 2CC   Final    Culture  Setup Time 04/13/2012 00:52   Final    Culture     Final    Value:        BLOOD CULTURE RECEIVED NO GROWTH TO DATE CULTURE WILL BE HELD FOR 5 DAYS BEFORE ISSUING A FINAL NEGATIVE REPORT   Report Status PENDING   Incomplete      Studies: No results found.  Scheduled Meds:    . amitriptyline  25 mg Oral QHS  . amLODipine  10 mg Oral Daily  . docusate sodium  100 mg Oral BID  . folic acid  1 mg Oral Daily  . glipiZIDE  10 mg Oral Q breakfast  . heparin  5,000 Units Subcutaneous Q8H  . hydrALAZINE  25 mg Oral Q8H  . metFORMIN  1,000 mg Oral BID WC  . pantoprazole  40 mg Oral Daily  . polyethylene glycol  17 g Oral Daily  . senna  1 tablet Oral BID  . sodium phosphate  1 enema Rectal Once   Continuous Infusions:    Principal Problem:  *Vasoocclusive sickle cell crisis Active Problems:  Sickle cell anemia  Hypertension  Diabetes mellitus  Chest pain  Leukocytosis  LBBB (left bundle branch block)  Ileus    Time spent: 13min    [facility] Pager [phone]. If 8PM-8AM, please contact night-coverage at [email or web address], password [facility] 04/15/2012, 8:28 AM  LOS: 6 days

## 2012-04-19 LAB — CULTURE, BLOOD (ROUTINE X 2): Culture: NO GROWTH

## 2012-05-16 NOTE — Discharge Summary (Addendum)
Physician Discharge Summary  Patient ID: [name] MRN: [id number] DOB/AGE: 12-17-1950 61 y.o.  Admit date: 04/09/2012 Discharge date: 04/15/2012  Primary Care Physician:  Ricke Hey, MD   Discharge Diagnoses:    Principal Problem:  *Vasoocclusive sickle cell crisis Active Problems:  Sickle cell anemia  Hypertension  Diabetes mellitus  Pleuritic Chest pain  Chronic Leukocytosis  LBBB (left bundle branch block)  Ileus  Pneumonia      Medication List     As of 05/16/2012  7:38 PM    TAKE these medications         amLODipine-benazepril 10-20 MG per capsule   Commonly known as: LOTREL   Take 1 capsule by mouth daily.      DSS 100 MG Caps   Take 100 mg by mouth 2 (two) times daily.      folic acid 1 MG tablet   Commonly known as: FOLVITE   Take 1 tablet (1 mg total) by mouth daily.      folic acid 1 MG tablet   Commonly known as: FOLVITE   Take 1 tablet (1 mg total) by mouth daily.      glipiZIDE 10 MG 24 hr tablet   Commonly known as: GLUCOTROL XL   Take 10 mg by mouth daily.      levofloxacin 500 MG tablet   Commonly known as: LEVAQUIN   Take 1 tablet (500 mg total) by mouth daily. For 4 days      metFORMIN 1000 MG tablet   Commonly known as: GLUCOPHAGE   Take 1,000 mg by mouth 2 (two) times daily with a meal.      omeprazole 20 MG capsule   Commonly known as: PRILOSEC   Take 20 mg by mouth daily.      oxyCODONE-acetaminophen 5-325 MG per tablet   Commonly known as: PERCOCET/ROXICET   Take 1-2 tablets by mouth every 6 (six) hours as needed. For pain      polyethylene glycol packet   Commonly known as: MIRALAX / GLYCOLAX   Take 17 g by mouth daily.         Disposition and Follow-up:  PCP in 1 week HEmatology in 1 month   Significant Diagnostic Studies:  No results found.  Brief H and P: HPI: [name] is a 61 y.o. male with known Hb SS [?] . He has been having issues with heat in his house and the gas supply he thinks  have made him nauseous and sick. When he went outside today 04/09/12, ghe got hot, nauseated and had some nausea and "just got sick"-he got real lightheaded and had to "sit down before he fell down".  He states that this has happened before, about 2-3 weeks ago-he states that he starts and pain in his lower back/joint/arms/shoulders/knees and this states hurting and bothering him. He states that the majority of his pain is in his chest today, across the top of the chest-he states that the chest pains he has are significant and are central chest in nature and shoots down to the lower part of his back-this is the worst it has been. This is "the worst it has ever been"  He came here 2 weeks ago and went on home with meds-the pain seemed to be relieved    Hospital Course:  1. Vaso-occlusive sickle cell crisis-treated with supportive care with IVF, Oxygen, narcotics for pain control, Patient not on hydroxyurea for unclear reasons. Will need Fu with hematology given chronic  leukocytosis as well. 2. Abd pain/distension: from ileus, narcotics,  constipation, KUB nonobstructive, improved , diet gradually advanced , bowel regimen  Including miralax to avoid constipation 3. Health care assoc Pneumonia: treated initially with IV Vanc/Zosyn today, improving, fevers down, subsequently  Changed  to Po antibiotics on 11/11 4. Hypertension-stable, uncontrolled, hydralazine add norvasc 5. Diabetes mellitus type 2-resumed oral hypoglycemics, SSI 6. Chronic leukocytosis-likely related to sickle cell trait, FU with heme as outpatient      Time spent on Discharge: 72min  Signed: Kynslei Art [facility]  05/16/2012, 7:38 PM

## 2013-03-03 IMAGING — CR DG CHEST 2V
2 series · 2 of 2 positions shown · non-contrast
Comparison: Plain film 07/28/2011

CLINICAL DATA: Sickle cell crisis

CHEST - 2 VIEW

[w chest pa]
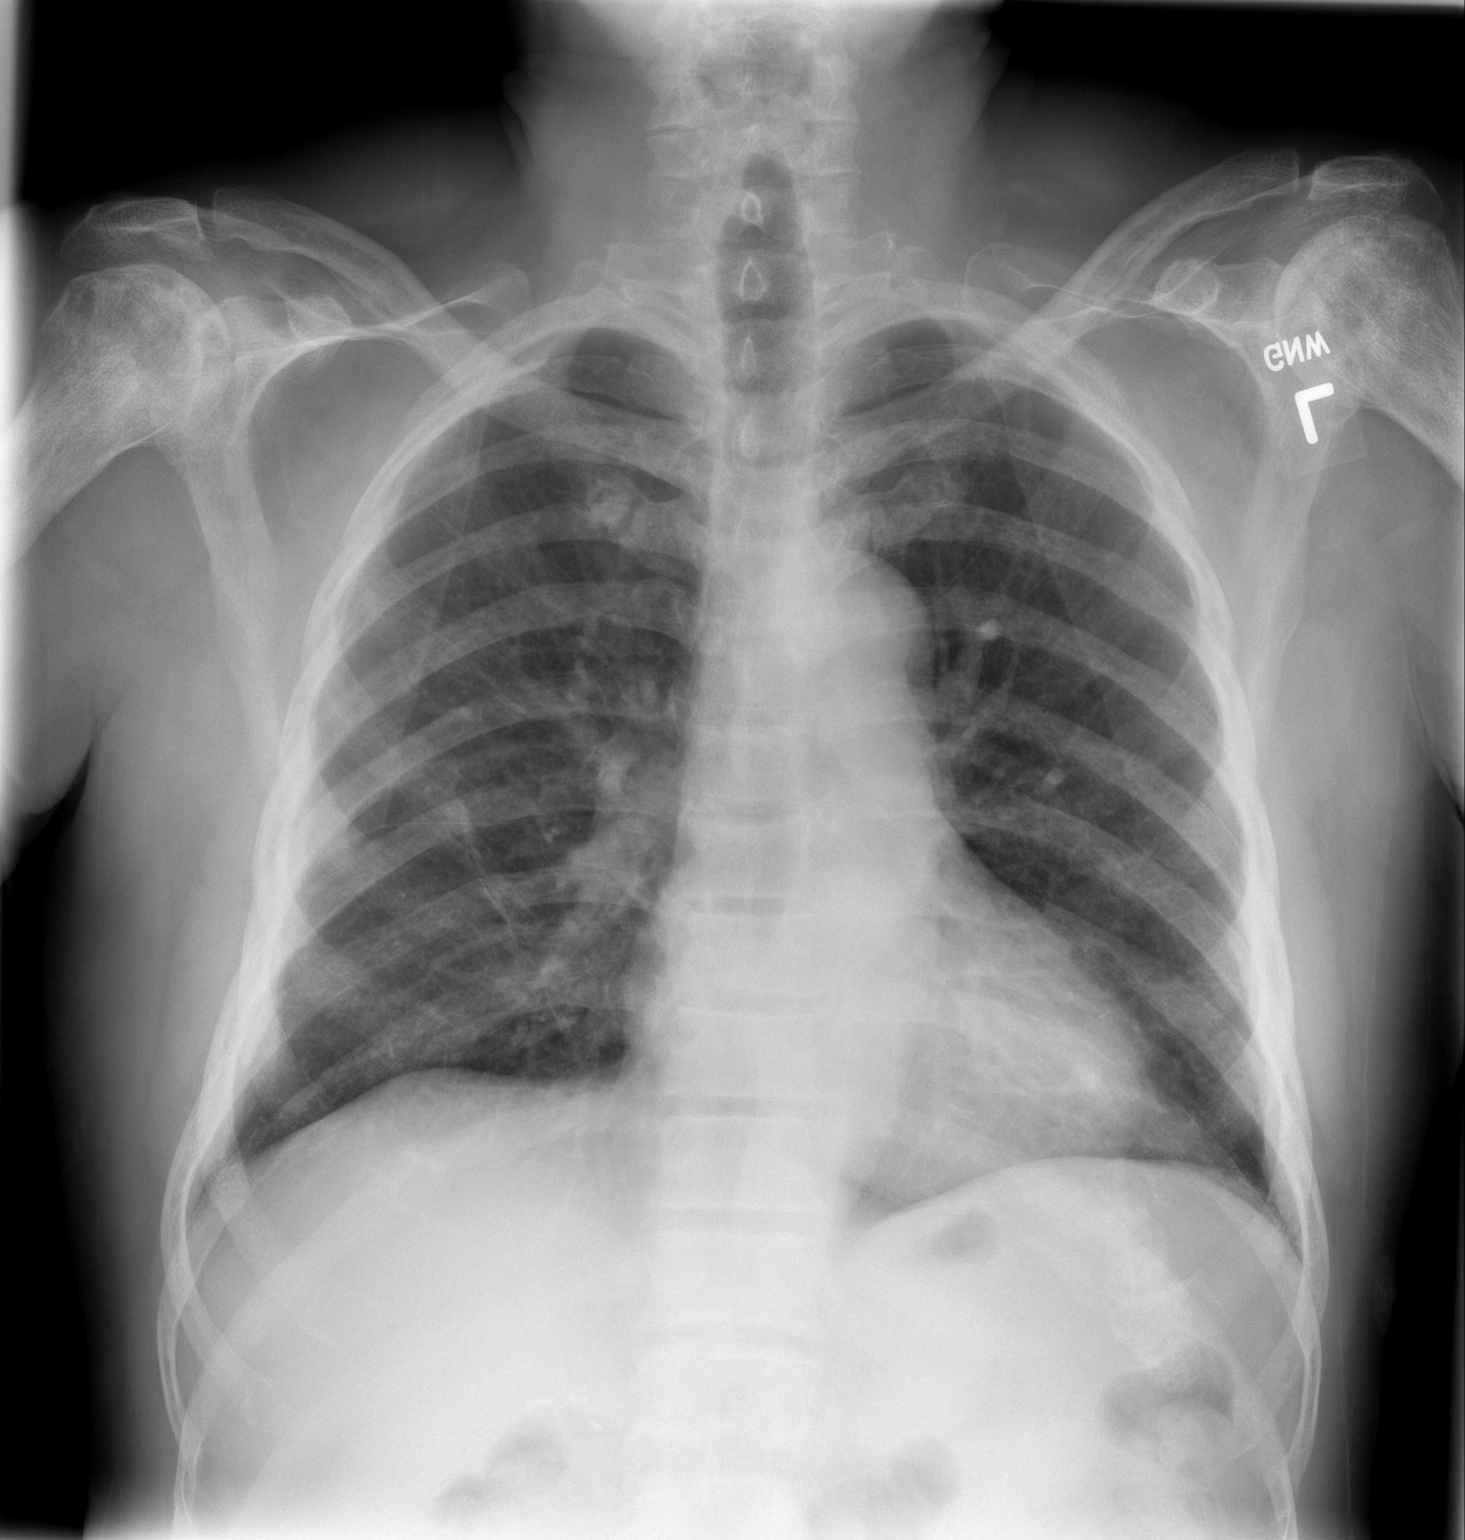

[w chest lat]
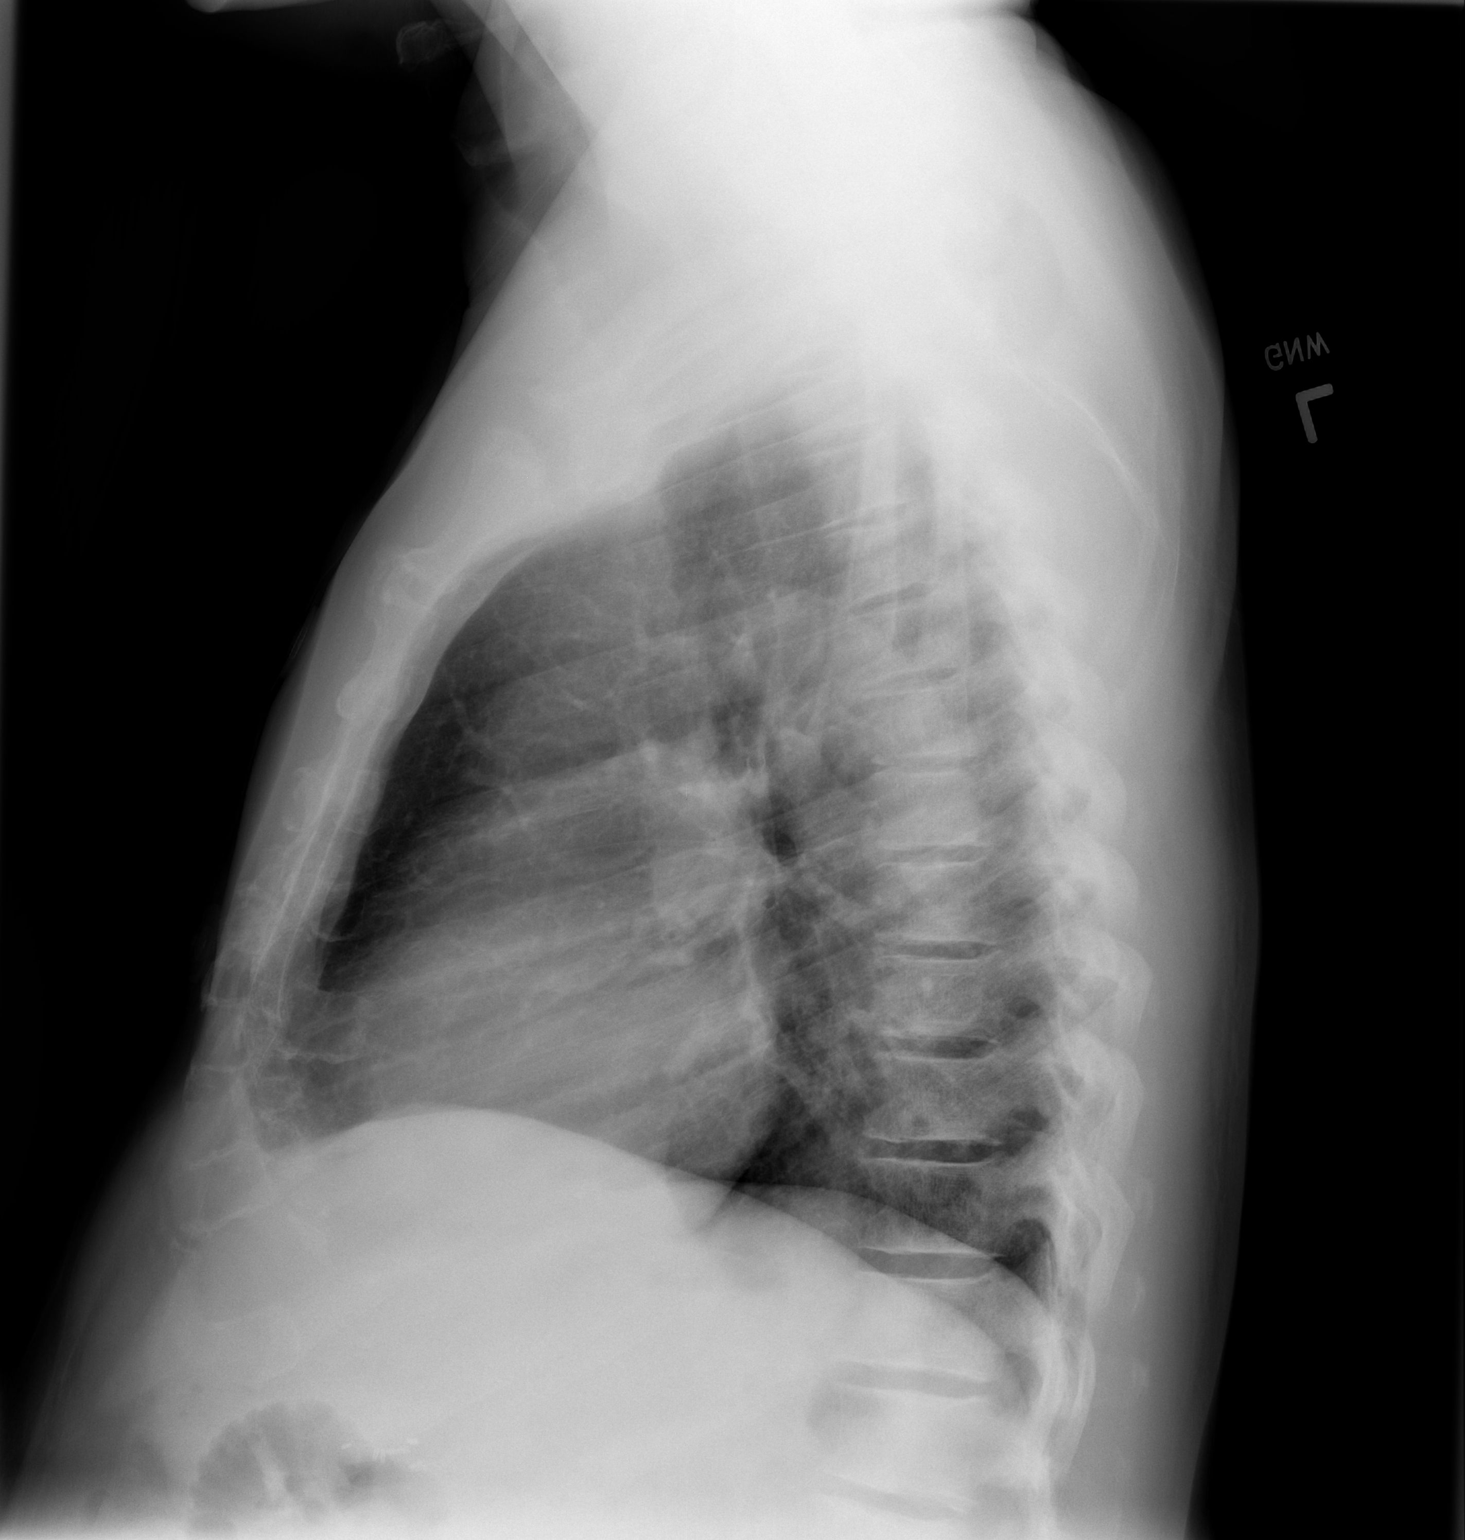

[2 of 2 positions shown; findings below may reference images not displayed]

FINDINGS: Normal cardiac silhouette.  There is a chronic-appearing
pulmonary findings unchanged from prior.  No focal consolidation.
No pulmonary edema.  There is a sclerotic change within the humeral
heads consistent with sickle cell disease.
IMPRESSION: 1..  No evidence of pneumonia or pulmonary edema.
2.  Chronic change in the lungs.

3.  Chronic changes within the bones consistent sickle cell
disease.

## 2013-04-10 IMAGING — CR DG ABDOMEN 1V
1 series · 1 of 1 positions shown · non-contrast
Comparison: Abdomen film of 07/28/2011 and pelvis films of
09/11/2003

CLINICAL DATA: Abdominal distention and pain

ABDOMEN - 1 VIEW

[t abdomen supine]
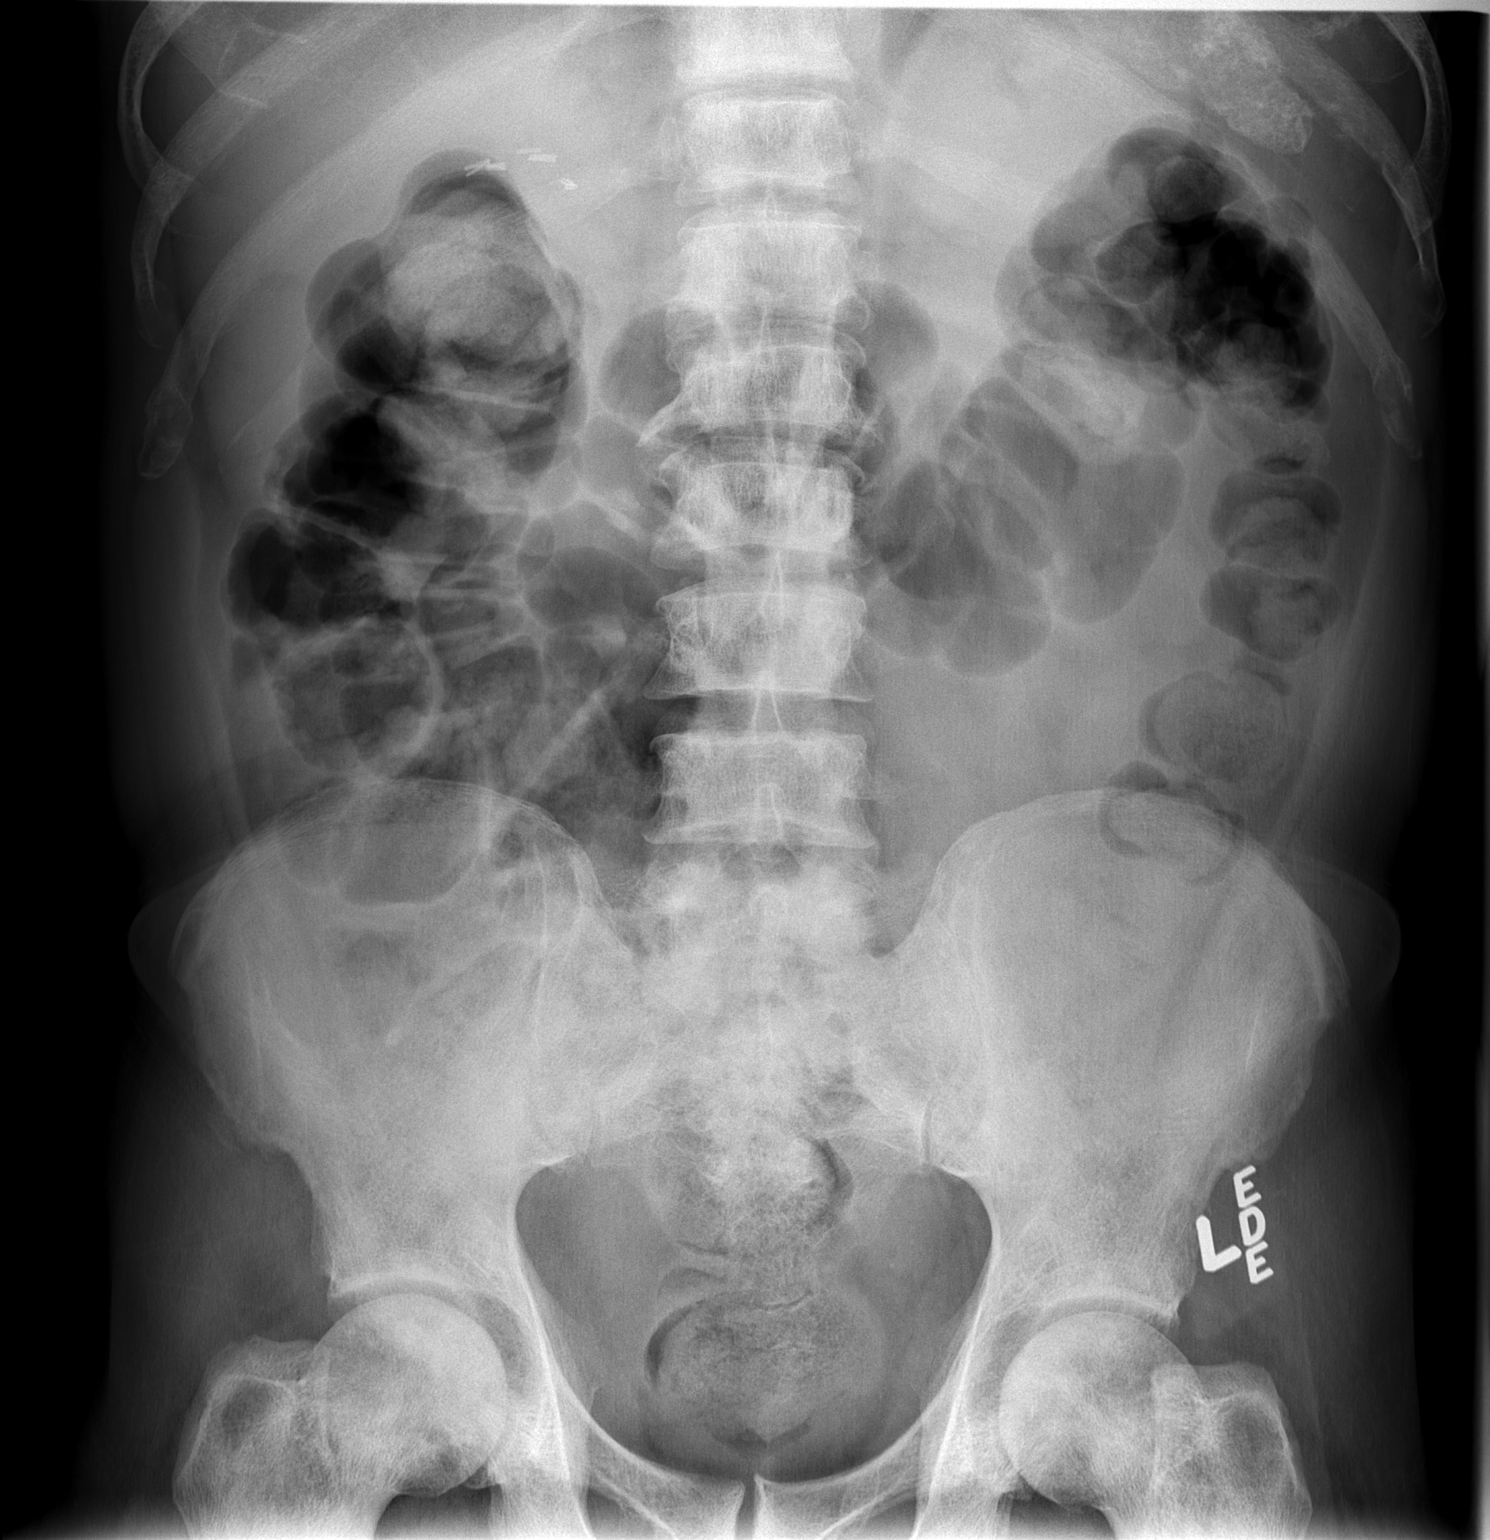

[1 of 1 positions shown; findings below may reference images not displayed]

FINDINGS: A supine view of the abdomen shows no bowel obstruction.
Some bowel gas is noted throughout the colon without distention.
However the bones are markedly sclerotic and irregular as noted
previously, consistent with changes of sickle cell disease and bone
infarction.  Surgical clips are noted in the right upper quadrant.
IMPRESSION: 1.  No bowel obstruction.
2.  Bony changes consistent with sickle cell disease.

## 2013-04-12 ENCOUNTER — Encounter (HOSPITAL_COMMUNITY): Payer: Self-pay | Admitting: Emergency Medicine

## 2013-04-12 ENCOUNTER — Emergency Department (HOSPITAL_COMMUNITY): Payer: Medicare Other

## 2013-04-12 ENCOUNTER — Emergency Department (HOSPITAL_COMMUNITY)
Admission: EM | Admit: 2013-04-12 | Discharge: 2013-04-12 | Disposition: A | Discharge status: Home / Self Care | Payer: Medicare Other | Source: Home / Self Care | Attending: Emergency Medicine | Admitting: Emergency Medicine

## 2013-04-12 DIAGNOSIS — Z862 Personal history of diseases of the blood and blood-forming organs and certain disorders involving the immune mechanism: Secondary | ICD-10-CM | POA: Insufficient documentation

## 2013-04-12 DIAGNOSIS — F1490 Cocaine use, unspecified, uncomplicated: Secondary | ICD-10-CM

## 2013-04-12 DIAGNOSIS — I10 Essential (primary) hypertension: Secondary | ICD-10-CM | POA: Insufficient documentation

## 2013-04-12 DIAGNOSIS — M545 Low back pain, unspecified: Secondary | ICD-10-CM | POA: Insufficient documentation

## 2013-04-12 DIAGNOSIS — E119 Type 2 diabetes mellitus without complications: Secondary | ICD-10-CM | POA: Insufficient documentation

## 2013-04-12 DIAGNOSIS — R609 Edema, unspecified: Secondary | ICD-10-CM | POA: Insufficient documentation

## 2013-04-12 DIAGNOSIS — R079 Chest pain, unspecified: Secondary | ICD-10-CM

## 2013-04-12 DIAGNOSIS — F1410 Cocaine abuse, uncomplicated: Secondary | ICD-10-CM | POA: Insufficient documentation

## 2013-04-12 DIAGNOSIS — R109 Unspecified abdominal pain: Secondary | ICD-10-CM

## 2013-04-12 DIAGNOSIS — R1084 Generalized abdominal pain: Secondary | ICD-10-CM | POA: Insufficient documentation

## 2013-04-12 DIAGNOSIS — R011 Cardiac murmur, unspecified: Secondary | ICD-10-CM | POA: Insufficient documentation

## 2013-04-12 DIAGNOSIS — Z87891 Personal history of nicotine dependence: Secondary | ICD-10-CM | POA: Insufficient documentation

## 2013-04-12 DIAGNOSIS — R112 Nausea with vomiting, unspecified: Secondary | ICD-10-CM | POA: Insufficient documentation

## 2013-04-12 LAB — GLUCOSE, CAPILLARY
Glucose-Capillary: 49 mg/dL — ABNORMAL LOW (ref 70–99)
Glucose-Capillary: 105 mg/dL — ABNORMAL HIGH (ref 70–99)

## 2013-04-12 LAB — CBC WITH DIFFERENTIAL/PLATELET
Basophils Relative: 1 % (ref 0–1)
Eosinophils Relative: 1 % (ref 0–5)
HCT: 25.2 % — ABNORMAL LOW (ref 39.0–52.0)
Hemoglobin: 8.5 g/dL — ABNORMAL LOW (ref 13.0–17.0)
Lymphocytes Relative: 14 % (ref 12–46)
MCH: 23.6 pg — ABNORMAL LOW (ref 26.0–34.0)
Neutro Abs: 19.2 10*3/uL — ABNORMAL HIGH (ref 1.7–7.7)
Neutrophils Relative %: 78 % — ABNORMAL HIGH (ref 43–77)
RBC: 3.6 MIL/uL — ABNORMAL LOW (ref 4.22–5.81)

## 2013-04-12 LAB — RETICULOCYTES
RBC.: 3.6 MIL/uL — ABNORMAL LOW (ref 4.22–5.81)
Retic Count, Absolute: 320.4 10*3/uL — ABNORMAL HIGH (ref 19.0–186.0)

## 2013-04-12 LAB — POCT I-STAT TROPONIN I: Troponin i, poc: 0.02 ng/mL (ref 0.00–0.08)

## 2013-04-12 LAB — POCT I-STAT, CHEM 8
BUN: 16 mg/dL (ref 6–23)
Chloride: 105 mEq/L (ref 96–112)
Creatinine, Ser: 1.8 mg/dL — ABNORMAL HIGH (ref 0.50–1.35)
Potassium: 4.5 mEq/L (ref 3.5–5.1)
Sodium: 137 mEq/L (ref 135–145)

## 2013-04-12 LAB — RAPID URINE DRUG SCREEN, HOSP PERFORMED: Opiates: NOT DETECTED

## 2013-04-12 MED ORDER — SODIUM CHLORIDE 0.9 % IV BOLUS (SEPSIS): 1000.0000 mL | Freq: Once | INTRAVENOUS | Status: DC

## 2013-04-12 MED ORDER — ONDANSETRON 4 MG PO TBDP
4.0000 mg | ORAL_TABLET | Freq: Once | ORAL | Status: AC
  Administered 2013-04-12: 4 mg via ORAL
  Filled 2013-04-12: qty 1

## 2013-04-12 MED ORDER — HYDROCODONE-ACETAMINOPHEN 5-325 MG PO TABS: 2.0000 | ORAL_TABLET | ORAL | Status: DC | PRN

## 2013-04-12 MED ORDER — HYDROMORPHONE HCL PF 1 MG/ML IJ SOLN: 1.0000 mg | Freq: Once | INTRAMUSCULAR | Status: DC

## 2013-04-12 MED ORDER — HYDROMORPHONE HCL PF 2 MG/ML IJ SOLN
2.0000 mg | Freq: Once | INTRAMUSCULAR | Status: AC
  Administered 2013-04-12: 2 mg via INTRAMUSCULAR
  Filled 2013-04-12: qty 1

## 2013-04-12 MED ORDER — HYDROMORPHONE HCL PF 1 MG/ML IJ SOLN
1.0000 mg | Freq: Once | INTRAMUSCULAR | Status: AC
  Administered 2013-04-12: 1 mg via INTRAMUSCULAR
  Filled 2013-04-12: qty 1

## 2013-04-12 MED ORDER — ONDANSETRON 4 MG PO TBDP
8.0000 mg | ORAL_TABLET | Freq: Once | ORAL | Status: AC
  Administered 2013-04-12: 8 mg via ORAL
  Filled 2013-04-12: qty 2

## 2013-04-12 NOTE — ED Provider Notes (Signed)
CSN: [id number]     Arrival date & time 04/12/13  0804 History   First MD Initiated Contact with Patient 04/12/13 [phone]     Chief Complaint  Patient presents with  . Chest Pain   (Consider location/radiation/quality/duration/timing/severity/associated sxs/prior Treatment) HPI Comments: The patient is a 62 year-old male with a past medical history of Sickle cell anemia, HTN, and DM presenting the Emergency Department with a chief complaint of Chest discomfort and abdominal pain at 0700 today.  The patient reports waking up to Abdominal pain.  He reports central chest pain without radiation with associated shortness of breath and productive cough.  Reports vomiting and nausea.  Reports back pain and lower extremity pain.  Denies paresthesias.  Reports cocaine use yesertday afternoon and EtOH yesterday.  Glucose at home today 95.    Patient is a 62 y.o. male presenting with chest pain. The history is provided by the patient.  Chest Pain   Past Medical History  Diagnosis Date  . Sickle cell anemia     Hgb SS disease   . Diabetes mellitus   . Hypertension    Past Surgical History  Procedure Laterality Date  . Colon surgery      Colon cancer surgery, removed small amt not full colectomy  . Cholecystectomy    . Leg surgery      For ? gangrene after running into barbwire fence at 62yo  . Colonoscopy      nclear who follows him for this   Family History  Problem Relation Age of Onset  . Venous thrombosis Mother     Dec. of blood clot   . Diabetes type II Mother   . Throat cancer Father     Deceased of throat cancer   History  Substance Use Topics  . Smoking status: Former Smoker -- 0.50 packs/day for 20 years    Quit date: 06/05/1966  . Smokeless tobacco: Not on file  . Alcohol Use: Yes     Comment: 1-2 beers a day    Review of Systems  All other systems reviewed and are negative.    Allergies  Morphine and related  Home Medications   No current outpatient  prescriptions on file. BP 164/98  Pulse 88  Temp(Src) 97.7 F (36.5 C) (Oral)  Resp 20  SpO2 100% Physical Exam  Nursing note and vitals reviewed. Constitutional: He is oriented to person, place, and time. He appears well-developed and well-nourished.  Appears uncomfortable  HENT:  Head: Normocephalic and atraumatic.  Mouth/Throat: Oropharynx is clear and moist.  Eyes: No scleral icterus.  Neck: Neck supple. No JVD present.  Cardiovascular: Normal rate and regular rhythm.   Murmur heard. Trace pitting edema to bilateral lower extremities  Pulmonary/Chest: Effort normal and breath sounds normal. He has no wheezes. He has no rales.  Abdominal: Soft. Bowel sounds are normal. He exhibits no distension. There is generalized tenderness. There is no rebound and no guarding.  Musculoskeletal: Normal range of motion.  Neurological: He is alert and oriented to person, place, and time.  Skin: Skin is warm and dry. No rash noted.    ED Course  Procedures (including critical care time) Labs Review Labs Reviewed  CBC WITH DIFFERENTIAL - Abnormal; Notable for the following:    WBC 24.5 (*)    RBC 3.60 (*)    Hemoglobin 8.5 (*)    HCT 25.2 (*)    MCV 70.0 (*)    MCH 23.6 (*)    RDW 16.9 (*)  Neutrophils Relative % 78 (*)    Neutro Abs 19.2 (*)    Monocytes Absolute 1.5 (*)    Basophils Absolute 0.2 (*)    All other components within normal limits  RETICULOCYTES - Abnormal; Notable for the following:    Retic Ct Pct 8.9 (*)    RBC. 3.60 (*)    Retic Count, Manual 320.4 (*)    All other components within normal limits  URINE RAPID DRUG SCREEN (HOSP PERFORMED) - Abnormal; Notable for the following:    Cocaine POSITIVE (*)    All other components within normal limits  GLUCOSE, CAPILLARY - Abnormal; Notable for the following:    Glucose-Capillary 49 (*)    All other components within normal limits  GLUCOSE, CAPILLARY - Abnormal; Notable for the following:    Glucose-Capillary 105  (*)    All other components within normal limits  POCT I-STAT, CHEM 8 - Abnormal; Notable for the following:    Creatinine, Ser 1.80 (*)    Glucose, Bld 56 (*)    Calcium, Ion 1.31 (*)    Hemoglobin 10.2 (*)    HCT 30.0 (*)    All other components within normal limits  POCT I-STAT TROPONIN I  POCT I-STAT TROPONIN I   Imaging Review Dg Chest 2 View  04/13/2013   CLINICAL DATA:  Sickle cell pain crisis, chest and body pain.  EXAM: CHEST  2 VIEW  COMPARISON:  04/12/2013  FINDINGS: The heart size and mediastinal contours are within normal limits. Both lungs are clear. There is patchy sclerosis and lucency in the humeral heads bilaterally as can be seen with avascular necrosis.  IMPRESSION: No active cardiopulmonary disease.   Electronically Signed   By: Kathreen Devoid   On: 04/13/2013 13:59   Dg Abd 1 View  04/14/2013   CLINICAL DATA:  Abdominal distention.  EXAM: ABDOMEN - 1 VIEW  COMPARISON:  04/10/2012.  07/28/2011.  FINDINGS: Stool is noted throughout the colon. Lucencies projected over the right colon most likely represent a large amount of stool as opposed to pneumatosis. No prominent bowel distention is noted. No free air is noted. Surgical clips right upper quadrant. The bones are diffusely sclerotic. This is consistent with the patient's known sickle cell disease. Other etiologies of diffuse osteosclerosis including metastatic disease cannot be excluded. These findings are stable from prior exams of 04/10/2012 and 07/28/2011 and again most consistent the patient's known sickle cell disease.  IMPRESSION: 1. No evidence of bowel distention. Stool is noted throughout the colon. 2. Sclerotic osseous changes consistent with patient's known sickle cell disease.   Electronically Signed   By: Marcello Moores  Register   On: 04/14/2013 07:57    EKG Interpretation     Ventricular Rate:  84 PR Interval:  180 QRS Duration: 156 QT Interval:  416 QTC Calculation: 492 R Axis:   80 Text Interpretation:   Sinus rhythm Nonspecific intraventricular conduction delay Probable anteroseptal infarct, recent Lateral leads are also involved Baseline wander in lead(s) V4 V6 since last tracing no significant change            MDM   1. Chest pain   2. Abdominal pain   3. Cocaine use    Patient with a history of SS disease. Presents with chest pain.  CBC, reticulocytes, troponin, EKG, chest XR.   Per EMR Dr. Eulis Foster reports a history of cocaine abuse. Drug screen ordered.   CGB-49, patient is alert talking and following commands.  D-50 ordered.   Hbg  stable and Chest XR without signs of acute process or acute chest syndrome.   EMR reveals last ECHo 2013 - EF 55-60%.  Upon further questioning the patient repots using cocaine and EtOH yesterday.  Reports chest pain is bilateral and is described as pressure.  1430 Patient reports abdominal pain has improved, 3/10. He reports joint pain. Discussed need to stop using cocaine as this can cause cardiac events, strokes, and other health risks.   Meds given in ED:  Medications  HYDROmorphone (DILAUDID) injection 2 mg (2 mg Intramuscular Given 04/12/13 1118)  ondansetron (ZOFRAN-ODT) disintegrating tablet 8 mg (8 mg Oral Given 04/12/13 1118)  HYDROmorphone (DILAUDID) injection 1 mg (1 mg Intramuscular Given 04/12/13 1347)  ondansetron (ZOFRAN-ODT) disintegrating tablet 4 mg (4 mg Oral Given 04/12/13 1346)    Discharge Medication List as of 04/12/2013  2:42 PM    START taking these medications   Details  !! HYDROcodone-acetaminophen (NORCO/VICODIN) 5-325 MG per tablet Take 2 tablets by mouth every 4 (four) hours as needed., Starting 04/12/2013, Until Discontinued, Print     !! - Potential duplicate medications found. Please discuss with provider.        Lorrine Kin, PA-C 04/15/13 1210

## 2013-04-12 NOTE — ED Notes (Signed)
Pt. Having a sickle cell crisis that began about 3 hours ago.  Chest pain into his back with nausea and sob. Skin is warm and dry.

## 2013-04-12 NOTE — ED Notes (Signed)
Pt given meal sack per Dr. Eulis Foster.

## 2013-04-12 NOTE — ED Provider Notes (Signed)
  Face-to-face evaluation   History: He began having chest, abdomen, and low back pain several hours ago. No prior recent illnesses. He has not seen his PCP, for 3 months. He denies fever, cough, or shortness of breath.  Physical exam:  At 0845 0-- Alert, elderly man appears older than stated age. Heart regular rate and rhythm. No murmur. Lungs clear to auscultation is alert and oriented x3. Behavior is appropriate. Strength and sensation is normal in the arms, and legs, bilaterally    Medical screening examination/treatment/procedure(s) were conducted as a shared visit with non-physician practitioner(s) and myself.  I personally evaluated the patient during the encounter  Richarda Blade, MD 04/15/13 1546

## 2013-04-12 NOTE — ED Notes (Signed)
IV starts attempted by 2 RN's.  IV team paged.

## 2013-04-12 NOTE — ED Notes (Signed)
Pt requesting O2, states it helps ease pain.  O2 via [place] at 2L/min placed.

## 2013-04-12 NOTE — ED Notes (Signed)
[place] notified of difficulty getting IV start.  Pt given orange juice per PA for low CBG.  IV team to come attempt IV start.

## 2013-04-12 NOTE — ED Notes (Signed)
Dr. Eulis Foster notified that IV team was unable to obtain IV access.

## 2013-04-13 ENCOUNTER — Encounter (HOSPITAL_COMMUNITY): Payer: Self-pay | Admitting: Emergency Medicine

## 2013-04-13 ENCOUNTER — Emergency Department (HOSPITAL_COMMUNITY)
Admission: EM | Admit: 2013-04-13 | Discharge: 2013-04-13 | Disposition: A | Discharge status: Home / Self Care | Payer: Medicare Other | Attending: Emergency Medicine | Admitting: Emergency Medicine

## 2013-04-13 ENCOUNTER — Inpatient Hospital Stay (HOSPITAL_COMMUNITY)
Admission: EM | Admit: 2013-04-13 | Discharge: 2013-04-18 | DRG: 812 | Disposition: A | Discharge status: Home Health | Payer: Medicare Other | Attending: Internal Medicine | Admitting: Internal Medicine

## 2013-04-13 ENCOUNTER — Emergency Department (HOSPITAL_COMMUNITY): Payer: Medicare Other

## 2013-04-13 DIAGNOSIS — I129 Hypertensive chronic kidney disease with stage 1 through stage 4 chronic kidney disease, or unspecified chronic kidney disease: Secondary | ICD-10-CM | POA: Diagnosis present

## 2013-04-13 DIAGNOSIS — Z833 Family history of diabetes mellitus: Secondary | ICD-10-CM

## 2013-04-13 DIAGNOSIS — Z87891 Personal history of nicotine dependence: Secondary | ICD-10-CM | POA: Insufficient documentation

## 2013-04-13 DIAGNOSIS — R079 Chest pain, unspecified: Secondary | ICD-10-CM | POA: Diagnosis present

## 2013-04-13 DIAGNOSIS — R7989 Other specified abnormal findings of blood chemistry: Secondary | ICD-10-CM | POA: Diagnosis present

## 2013-04-13 DIAGNOSIS — D62 Acute posthemorrhagic anemia: Secondary | ICD-10-CM | POA: Diagnosis present

## 2013-04-13 DIAGNOSIS — D5700 Hb-SS disease with crisis, unspecified: Secondary | ICD-10-CM | POA: Diagnosis present

## 2013-04-13 DIAGNOSIS — Z85038 Personal history of other malignant neoplasm of large intestine: Secondary | ICD-10-CM

## 2013-04-13 DIAGNOSIS — I10 Essential (primary) hypertension: Secondary | ICD-10-CM | POA: Diagnosis present

## 2013-04-13 DIAGNOSIS — N183 Chronic kidney disease, stage 3 unspecified: Secondary | ICD-10-CM | POA: Diagnosis present

## 2013-04-13 DIAGNOSIS — D571 Sickle-cell disease without crisis: Secondary | ICD-10-CM

## 2013-04-13 DIAGNOSIS — I447 Left bundle-branch block, unspecified: Secondary | ICD-10-CM

## 2013-04-13 DIAGNOSIS — Z9049 Acquired absence of other specified parts of digestive tract: Secondary | ICD-10-CM

## 2013-04-13 DIAGNOSIS — D72829 Elevated white blood cell count, unspecified: Secondary | ICD-10-CM | POA: Diagnosis present

## 2013-04-13 DIAGNOSIS — D6959 Other secondary thrombocytopenia: Secondary | ICD-10-CM | POA: Diagnosis present

## 2013-04-13 DIAGNOSIS — E860 Dehydration: Secondary | ICD-10-CM | POA: Diagnosis present

## 2013-04-13 DIAGNOSIS — E119 Type 2 diabetes mellitus without complications: Secondary | ICD-10-CM

## 2013-04-13 DIAGNOSIS — D696 Thrombocytopenia, unspecified: Secondary | ICD-10-CM | POA: Diagnosis present

## 2013-04-13 DIAGNOSIS — E1129 Type 2 diabetes mellitus with other diabetic kidney complication: Secondary | ICD-10-CM | POA: Diagnosis present

## 2013-04-13 DIAGNOSIS — Z791 Long term (current) use of non-steroidal anti-inflammatories (NSAID): Secondary | ICD-10-CM | POA: Insufficient documentation

## 2013-04-13 DIAGNOSIS — Z79899 Other long term (current) drug therapy: Secondary | ICD-10-CM | POA: Insufficient documentation

## 2013-04-13 DIAGNOSIS — T45515A Adverse effect of anticoagulants, initial encounter: Secondary | ICD-10-CM | POA: Diagnosis present

## 2013-04-13 LAB — COMPREHENSIVE METABOLIC PANEL
ALT: 20 U/L (ref 0–53)
AST: 58 U/L — ABNORMAL HIGH (ref 0–37)
Alkaline Phosphatase: 93 U/L (ref 39–117)
BUN: 24 mg/dL — ABNORMAL HIGH (ref 6–23)
CO2: 22 mEq/L (ref 19–32)
Chloride: 96 mEq/L (ref 96–112)
GFR calc Af Amer: 54 mL/min — ABNORMAL LOW (ref >90)
GFR calc non Af Amer: 47 mL/min — ABNORMAL LOW (ref >90)
Glucose, Bld: 248 mg/dL — ABNORMAL HIGH (ref 70–99)
Sodium: 130 mEq/L — ABNORMAL LOW (ref 135–145)
Total Bilirubin: 3.2 mg/dL — ABNORMAL HIGH (ref 0.3–1.2)
Total Protein: 7.9 g/dL (ref 6.0–8.3)

## 2013-04-13 LAB — CBC WITH DIFFERENTIAL/PLATELET
Basophils Relative: 0 % (ref 0–1)
Eosinophils Absolute: 0 10*3/uL (ref 0.0–0.7)
Eosinophils Relative: 0 % (ref 0–5)
Hemoglobin: 8 g/dL — ABNORMAL LOW (ref 13.0–17.0)
Lymphs Abs: 0.9 10*3/uL (ref 0.7–4.0)
MCV: 68.7 fL — ABNORMAL LOW (ref 78.0–100.0)
Monocytes Absolute: 2.3 10*3/uL — ABNORMAL HIGH (ref 0.1–1.0)
Monocytes Relative: 10 % (ref 3–12)
Neutro Abs: 19.3 10*3/uL — ABNORMAL HIGH (ref 1.7–7.7)
Neutrophils Relative %: 86 % — ABNORMAL HIGH (ref 43–77)
Platelets: 219 10*3/uL (ref 150–400)
RBC: 3.45 MIL/uL — ABNORMAL LOW (ref 4.22–5.81)
WBC: 22.5 10*3/uL — ABNORMAL HIGH (ref 4.0–10.5)

## 2013-04-13 LAB — RAPID URINE DRUG SCREEN, HOSP PERFORMED
Amphetamines: NOT DETECTED
Cocaine: POSITIVE — AB
Opiates: POSITIVE — AB

## 2013-04-13 LAB — RETICULOCYTES: RBC.: 3.45 MIL/uL — ABNORMAL LOW (ref 4.22–5.81)

## 2013-04-13 MED ORDER — SODIUM CHLORIDE 0.9 % IV BOLUS (SEPSIS)
1000.0000 mL | Freq: Once | INTRAVENOUS | Status: AC
  Administered 2013-04-13: 1000 mL via INTRAVENOUS

## 2013-04-13 MED ORDER — DIPHENHYDRAMINE HCL 50 MG/ML IJ SOLN
25.0000 mg | Freq: Once | INTRAMUSCULAR | Status: AC
  Administered 2013-04-13: 25 mg via INTRAVENOUS
  Filled 2013-04-13: qty 1

## 2013-04-13 MED ORDER — ONDANSETRON HCL 4 MG/2ML IJ SOLN
4.0000 mg | Freq: Once | INTRAMUSCULAR | Status: AC
  Administered 2013-04-13: 4 mg via INTRAVENOUS
  Filled 2013-04-13: qty 2

## 2013-04-13 MED ORDER — HYDROMORPHONE HCL PF 1 MG/ML IJ SOLN
2.0000 mg | Freq: Once | INTRAMUSCULAR | Status: AC
  Administered 2013-04-13: 2 mg via INTRAVENOUS
  Filled 2013-04-13: qty 2

## 2013-04-13 MED ORDER — SODIUM CHLORIDE 0.9 % IV SOLN: INTRAVENOUS | Status: DC

## 2013-04-13 NOTE — ED Notes (Signed)
Pt from home reports SCC since yesterday, was seen at [facility], given Vicodin for pain. Pt took meds with no relief. Pt pain in arms, legs, joints, lower back. Pt is A&O and in NAD

## 2013-04-13 NOTE — ED Notes (Signed)
Pt seen here earlier this evening,  Went home and pain become worse, pain in legs and hip, pt uncomfortable in triage. Has taken meds as directed without relief

## 2013-04-13 NOTE — ED Provider Notes (Signed)
CSN: [id number]     Arrival date & time 04/13/13  1328 History   First MD Initiated Contact with Patient 04/13/13 1339     Chief Complaint  Patient presents with  . Sickle Cell Pain Crisis   (Consider location/radiation/quality/duration/timing/severity/associated sxs/prior Treatment) Patient is a 62 y.o. male presenting with sickle cell pain. The history is provided by the patient and a friend.  Sickle Cell Pain Crisis  Patient here complaining of bilateral leg pain and back pain similar to his prior sickle cell pain. [facility] in date have a urine drug screen was positive for cocaine. He denies any cocaine use at this time. Denies any fever or chills. No chest pain or cough. No dysuria or hematuria. Use over-the-counter medications without relief. No rashes appreciated. Nothing makes the symptoms worse. Pain characterized as dull and worse with movement and located in his joints as well. Past Medical History  Diagnosis Date  . Sickle cell anemia     Hgb SS disease   . Diabetes mellitus   . Hypertension    Past Surgical History  Procedure Laterality Date  . Colon surgery      Colon cancer surgery, removed small amt not full colectomy  . Cholecystectomy    . Leg surgery      For ? gangrene after running into barbwire fence at 62yo  . Colonoscopy      nclear who follows him for this   Family History  Problem Relation Age of Onset  . Venous thrombosis Mother     Dec. of blood clot   . Throat cancer Father     Deceased of throat cancer   History  Substance Use Topics  . Smoking status: Former Smoker -- 0.50 packs/day for 20 years    Quit date: 06/05/1966  . Smokeless tobacco: Not on file  . Alcohol Use: Yes    Review of Systems  All other systems reviewed and are negative.    Allergies  Morphine and related  Home Medications   Current Outpatient Rx  Name  Route  Sig  Dispense  Refill  . amLODipine-benazepril (LOTREL) 10-20 MG per  capsule   Oral   Take 1 capsule by mouth daily.         . folic acid (FOLVITE) 1 MG tablet   Oral   Take 1 tablet (1 mg total) by mouth daily.   30 tablet   0   . gabapentin (NEURONTIN) 100 MG capsule   Oral   Take 100 mg by mouth 3 (three) times daily.         Marland Kitchen glipiZIDE (GLUCOTROL XL) 10 MG 24 hr tablet   Oral   Take 10 mg by mouth daily.         Marland Kitchen HYDROcodone-acetaminophen (NORCO/VICODIN) 5-325 MG per tablet   Oral   Take 2 tablets by mouth every 4 (four) hours as needed.   10 tablet   0   . HYDROCODONE-ACETAMINOPHEN PO   Oral   Take 1 tablet by mouth every 6 (six) hours as needed (for pain).         . metFORMIN (GLUCOPHAGE) 1000 MG tablet   Oral   Take 1,000 mg by mouth 2 (two) times daily with a meal.         . naproxen sodium (ANAPROX) 220 MG tablet   Oral   Take 440 mg by mouth daily.         Marland Kitchen omeprazole (PRILOSEC)  20 MG capsule   Oral   Take 20 mg by mouth daily.          BP 152/82  Pulse 91  Temp(Src) 98.9 F (37.2 C) (Oral)  Resp 24  SpO2 98% Physical Exam  Nursing note and vitals reviewed. Constitutional: He is oriented to person, place, and time. He appears well-developed and well-nourished.  Non-toxic appearance. No distress.  HENT:  Head: Normocephalic and atraumatic.  Eyes: Conjunctivae, EOM and lids are normal. Pupils are equal, round, and reactive to light.  Neck: Normal range of motion. Neck supple. No tracheal deviation present. No mass present.  Cardiovascular: Normal rate, regular rhythm and normal heart sounds.  Exam reveals no gallop.   No murmur heard. Pulmonary/Chest: Effort normal and breath sounds normal. No stridor. No respiratory distress. He has no decreased breath sounds. He has no wheezes. He has no rhonchi. He has no rales.  Abdominal: Soft. Normal appearance and bowel sounds are normal. He exhibits no distension. There is no tenderness. There is no rebound and no CVA tenderness.  Musculoskeletal: Normal range  of motion. He exhibits no edema and no tenderness.  Neurological: He is alert and oriented to person, place, and time. He has normal strength. No cranial nerve deficit or sensory deficit. GCS eye subscore is 4. GCS verbal subscore is 5. GCS motor subscore is 6.  Skin: Skin is warm and dry. No abrasion and no rash noted.  Psychiatric: He has a normal mood and affect. His speech is normal and behavior is normal.    ED Course  Procedures (including critical care time) Labs Review Labs Reviewed  URINE RAPID DRUG SCREEN (HOSP PERFORMED)  CBC WITH DIFFERENTIAL  COMPREHENSIVE METABOLIC PANEL  RETICULOCYTES  TROPONIN I   Imaging Review Dg Chest Portable 1 View  04/12/2013   CLINICAL DATA:  Mid chest pain for 2 hr, shortness of breath  EXAM: PORTABLE CHEST - 1 VIEW  COMPARISON:  04/12/2012.  FINDINGS: The heart size and mediastinal contours are within normal limits. Both lungs are clear. The visualized skeletal structures are unremarkable.  IMPRESSION: No active disease.   Electronically Signed   By: Kathreen Devoid   On: 04/12/2013 09:10    EKG Interpretation     Ventricular Rate:  90 PR Interval:  158 QRS Duration: 146 QT Interval:  386 QTC Calculation: 472 R Axis:   53 Text Interpretation:  Sinus rhythm IVCD, consider atypical LBBB No significant change since last tracing            MDM  No diagnosis found.   Patient given IV fluids and analgesics and feels better. He will followup with his physician tomorrow.  Leota Jacobsen, MD 04/13/13 [id number]

## 2013-04-13 NOTE — ED Notes (Signed)
Pt a/o x 4. Speech clear. Pt with no acute distress. Family members taking pt home. Pt ok to be d/c'ed per Dr. Regenia Skeeter. VSS.

## 2013-04-13 NOTE — ED Notes (Signed)
MD notified of decreased O2 sats. Pt placed on [place] @ 4L. Resp even/unlabored.

## 2013-04-14 ENCOUNTER — Inpatient Hospital Stay (HOSPITAL_COMMUNITY): Payer: Medicare Other

## 2013-04-14 ENCOUNTER — Encounter (HOSPITAL_COMMUNITY): Payer: Self-pay | Admitting: Internal Medicine

## 2013-04-14 DIAGNOSIS — D5700 Hb-SS disease with crisis, unspecified: Principal | ICD-10-CM

## 2013-04-14 DIAGNOSIS — R079 Chest pain, unspecified: Secondary | ICD-10-CM

## 2013-04-14 DIAGNOSIS — E119 Type 2 diabetes mellitus without complications: Secondary | ICD-10-CM

## 2013-04-14 DIAGNOSIS — I10 Essential (primary) hypertension: Secondary | ICD-10-CM

## 2013-04-14 LAB — URINALYSIS, ROUTINE W REFLEX MICROSCOPIC
Bilirubin Urine: NEGATIVE
Glucose, UA: 100 mg/dL — AB
Ketones, ur: NEGATIVE mg/dL
Leukocytes, UA: NEGATIVE
Protein, ur: 30 mg/dL — AB
Urobilinogen, UA: 0.2 mg/dL (ref 0.0–1.0)
pH: 5.5 (ref 5.0–8.0)

## 2013-04-14 LAB — COMPREHENSIVE METABOLIC PANEL
ALT: 22 U/L (ref 0–53)
ALT: 18 U/L (ref 0–53)
Albumin: 4.4 g/dL (ref 3.5–5.2)
Alkaline Phosphatase: 112 U/L (ref 39–117)
Alkaline Phosphatase: 95 U/L (ref 39–117)
BUN: 19 mg/dL (ref 6–23)
CO2: 24 mEq/L (ref 19–32)
CO2: 20 mEq/L (ref 19–32)
Chloride: 98 mEq/L (ref 96–112)
Chloride: 102 mEq/L (ref 96–112)
GFR calc Af Amer: 52 mL/min — ABNORMAL LOW (ref >90)
GFR calc Af Amer: 59 mL/min — ABNORMAL LOW (ref >90)
GFR calc non Af Amer: 45 mL/min — ABNORMAL LOW (ref >90)
Glucose, Bld: 168 mg/dL — ABNORMAL HIGH (ref 70–99)
Glucose, Bld: 162 mg/dL — ABNORMAL HIGH (ref 70–99)
Potassium: 4.2 mEq/L (ref 3.5–5.1)
Potassium: 4.5 mEq/L (ref 3.5–5.1)
Sodium: 132 mEq/L — ABNORMAL LOW (ref 135–145)
Sodium: 133 mEq/L — ABNORMAL LOW (ref 135–145)
Total Bilirubin: 1.9 mg/dL — ABNORMAL HIGH (ref 0.3–1.2)
Total Protein: 8.4 g/dL — ABNORMAL HIGH (ref 6.0–8.3)
Total Protein: 6.8 g/dL (ref 6.0–8.3)

## 2013-04-14 LAB — CBC WITH DIFFERENTIAL/PLATELET
Basophils Absolute: 0 10*3/uL (ref 0.0–0.1)
Eosinophils Relative: 0 % (ref 0–5)
Lymphs Abs: 2.5 10*3/uL (ref 0.7–4.0)
MCH: 22.9 pg — ABNORMAL LOW (ref 26.0–34.0)
MCHC: 33.3 g/dL (ref 30.0–36.0)
MCV: 68.8 fL — ABNORMAL LOW (ref 78.0–100.0)
Monocytes Absolute: 2.5 10*3/uL — ABNORMAL HIGH (ref 0.1–1.0)
Monocytes Relative: 11 % (ref 3–12)
Neutro Abs: 17.4 10*3/uL — ABNORMAL HIGH (ref 1.7–7.7)
Platelets: 172 10*3/uL (ref 150–400)
RBC: 3.14 MIL/uL — ABNORMAL LOW (ref 4.22–5.81)
RDW: 17.3 % — ABNORMAL HIGH (ref 11.5–15.5)
nRBC: 12 /100 WBC — ABNORMAL HIGH

## 2013-04-14 LAB — RETICULOCYTES: Retic Ct Pct: 8.2 % — ABNORMAL HIGH (ref 0.4–3.1)

## 2013-04-14 LAB — GLUCOSE, CAPILLARY
Glucose-Capillary: 148 mg/dL — ABNORMAL HIGH (ref 70–99)
Glucose-Capillary: 143 mg/dL — ABNORMAL HIGH (ref 70–99)
Glucose-Capillary: 165 mg/dL — ABNORMAL HIGH (ref 70–99)

## 2013-04-14 LAB — CBC
HCT: 24.9 % — ABNORMAL LOW (ref 39.0–52.0)
Hemoglobin: 8.4 g/dL — ABNORMAL LOW (ref 13.0–17.0)
MCH: 23.1 pg — ABNORMAL LOW (ref 26.0–34.0)
MCV: 68.6 fL — ABNORMAL LOW (ref 78.0–100.0)
RBC: 3.63 MIL/uL — ABNORMAL LOW (ref 4.22–5.81)

## 2013-04-14 LAB — MAGNESIUM: Magnesium: 2.1 mg/dL (ref 1.5–2.5)

## 2013-04-14 LAB — URINE MICROSCOPIC-ADD ON: Urine-Other: NONE SEEN

## 2013-04-14 MED ORDER — ONDANSETRON HCL 4 MG/2ML IJ SOLN
4.0000 mg | Freq: Once | INTRAMUSCULAR | Status: AC
  Administered 2013-04-14: 4 mg via INTRAVENOUS
  Filled 2013-04-14: qty 2

## 2013-04-14 MED ORDER — HYDROMORPHONE HCL PF 1 MG/ML IJ SOLN
1.0000 mg | Freq: Once | INTRAMUSCULAR | Status: AC
  Administered 2013-04-14: 1 mg via INTRAVENOUS
  Filled 2013-04-14: qty 1

## 2013-04-14 MED ORDER — SENNA 8.6 MG PO TABS
1.0000 | ORAL_TABLET | Freq: Two times a day (BID) | ORAL | Status: DC
  Administered 2013-04-14 (×2): 8.6 mg via ORAL
  Filled 2013-04-14 (×2): qty 1

## 2013-04-14 MED ORDER — HYDROCODONE-ACETAMINOPHEN 5-325 MG PO TABS
1.0000 | ORAL_TABLET | ORAL | Status: DC | PRN
  Administered 2013-04-14 – 2013-04-18 (×6): 2 via ORAL
  Filled 2013-04-14 (×6): qty 2

## 2013-04-14 MED ORDER — ONDANSETRON HCL 4 MG/2ML IJ SOLN: 4.0000 mg | INTRAMUSCULAR | Status: DC | PRN

## 2013-04-14 MED ORDER — INSULIN ASPART 100 UNIT/ML ~~LOC~~ SOLN
0.0000 [IU] | Freq: Three times a day (TID) | SUBCUTANEOUS | Status: DC
  Administered 2013-04-14 (×2): 3 [IU] via SUBCUTANEOUS
  Administered 2013-04-14 – 2013-04-15 (×3): 2 [IU] via SUBCUTANEOUS
  Administered 2013-04-15: 5 [IU] via SUBCUTANEOUS
  Administered 2013-04-16 (×2): 2 [IU] via SUBCUTANEOUS
  Administered 2013-04-16 – 2013-04-17 (×3): 3 [IU] via SUBCUTANEOUS
  Administered 2013-04-17: 5 [IU] via SUBCUTANEOUS
  Administered 2013-04-18 (×2): 3 [IU] via SUBCUTANEOUS

## 2013-04-14 MED ORDER — HYDROMORPHONE HCL PF 1 MG/ML IJ SOLN: 1.0000 mg | INTRAMUSCULAR | Status: DC | PRN

## 2013-04-14 MED ORDER — HYDROMORPHONE HCL PF 2 MG/ML IJ SOLN
2.0000 mg | INTRAMUSCULAR | Status: DC | PRN
  Administered 2013-04-14 – 2013-04-18 (×14): 2 mg via INTRAVENOUS
  Administered 2013-04-18: 1 mg via INTRAVENOUS
  Administered 2013-04-18: 2 mg via INTRAVENOUS
  Filled 2013-04-14 (×16): qty 1

## 2013-04-14 MED ORDER — DIPHENHYDRAMINE HCL 50 MG/ML IJ SOLN
12.5000 mg | INTRAMUSCULAR | Status: DC | PRN
  Administered 2013-04-14: 04:00:00 12.5 mg via INTRAVENOUS
  Filled 2013-04-14: qty 1

## 2013-04-14 MED ORDER — GABAPENTIN 100 MG PO CAPS
100.0000 mg | ORAL_CAPSULE | Freq: Three times a day (TID) | ORAL | Status: DC
  Administered 2013-04-14 – 2013-04-18 (×13): 100 mg via ORAL
  Filled 2013-04-14 (×16): qty 1

## 2013-04-14 MED ORDER — DIPHENHYDRAMINE HCL 25 MG PO CAPS: 25.0000 mg | ORAL_CAPSULE | ORAL | Status: DC | PRN

## 2013-04-14 MED ORDER — INFLUENZA VAC SPLIT QUAD 0.5 ML IM SUSP
0.5000 mL | INTRAMUSCULAR | Status: AC
  Administered 2013-04-15: 0.5 mL via INTRAMUSCULAR
  Filled 2013-04-14 (×2): qty 0.5

## 2013-04-14 MED ORDER — PANTOPRAZOLE SODIUM 40 MG PO TBEC
40.0000 mg | DELAYED_RELEASE_TABLET | Freq: Every day | ORAL | Status: DC
  Administered 2013-04-14 – 2013-04-18 (×5): 40 mg via ORAL
  Filled 2013-04-14 (×5): qty 1

## 2013-04-14 MED ORDER — ALPRAZOLAM 0.25 MG PO TABS
0.2500 mg | ORAL_TABLET | Freq: Three times a day (TID) | ORAL | Status: DC
  Administered 2013-04-14 (×2): 0.5 mg via ORAL
  Administered 2013-04-14: 0.25 mg via ORAL
  Administered 2013-04-15: 0.5 mg via ORAL
  Administered 2013-04-15 – 2013-04-16 (×3): 0.25 mg via ORAL
  Administered 2013-04-16 – 2013-04-17 (×3): 0.5 mg via ORAL
  Administered 2013-04-18: 0.25 mg via ORAL
  Filled 2013-04-14 (×2): qty 2
  Filled 2013-04-14 (×3): qty 1
  Filled 2013-04-14 (×2): qty 2
  Filled 2013-04-14: qty 1
  Filled 2013-04-14 (×3): qty 2
  Filled 2013-04-14: qty 1

## 2013-04-14 MED ORDER — ONDANSETRON HCL 4 MG/2ML IJ SOLN: 4.0000 mg | Freq: Three times a day (TID) | INTRAMUSCULAR | Status: DC | PRN

## 2013-04-14 MED ORDER — POLYETHYLENE GLYCOL 3350 17 G PO PACK
17.0000 g | PACK | Freq: Every day | ORAL | Status: DC | PRN
  Filled 2013-04-14: qty 1

## 2013-04-14 MED ORDER — FOLIC ACID 1 MG PO TABS
1.0000 mg | ORAL_TABLET | Freq: Every day | ORAL | Status: DC
  Administered 2013-04-14 – 2013-04-18 (×5): 1 mg via ORAL
  Filled 2013-04-14 (×5): qty 1

## 2013-04-14 MED ORDER — INSULIN ASPART 100 UNIT/ML ~~LOC~~ SOLN
0.0000 [IU] | Freq: Every day | SUBCUTANEOUS | Status: DC
  Administered 2013-04-15 – 2013-04-16 (×2): 2 [IU] via SUBCUTANEOUS

## 2013-04-14 MED ORDER — HYDROMORPHONE HCL PF 1 MG/ML IJ SOLN
1.0000 mg | INTRAMUSCULAR | Status: DC | PRN
  Administered 2013-04-14 (×3): 1 mg via INTRAVENOUS
  Filled 2013-04-14 (×3): qty 1

## 2013-04-14 MED ORDER — NAPROXEN SODIUM 275 MG PO TABS: 440.0000 mg | ORAL_TABLET | Freq: Every day | ORAL | Status: DC

## 2013-04-14 MED ORDER — SODIUM CHLORIDE 0.9 % IV SOLN
INTRAVENOUS | Status: DC
  Administered 2013-04-14 – 2013-04-16 (×4): via INTRAVENOUS

## 2013-04-14 MED ORDER — NAPROXEN 500 MG PO TABS
500.0000 mg | ORAL_TABLET | Freq: Every day | ORAL | Status: DC
  Administered 2013-04-14: 09:00:00 500 mg via ORAL
  Filled 2013-04-14 (×3): qty 1

## 2013-04-14 MED ORDER — FLEET ENEMA 7-19 GM/118ML RE ENEM: 1.0000 | ENEMA | Freq: Once | RECTAL | Status: AC | PRN

## 2013-04-14 MED ORDER — HYDROCODONE-ACETAMINOPHEN 5-325 MG PO TABS: 2.0000 | ORAL_TABLET | ORAL | Status: DC | PRN

## 2013-04-14 MED ORDER — ENOXAPARIN SODIUM 40 MG/0.4ML ~~LOC~~ SOLN
40.0000 mg | SUBCUTANEOUS | Status: DC
  Administered 2013-04-14: 40 mg via SUBCUTANEOUS
  Filled 2013-04-14 (×2): qty 0.4

## 2013-04-14 MED ORDER — BISACODYL 10 MG RE SUPP: 10.0000 mg | Freq: Every day | RECTAL | Status: DC | PRN

## 2013-04-14 MED ORDER — AMLODIPINE BESYLATE 10 MG PO TABS
10.0000 mg | ORAL_TABLET | Freq: Every day | ORAL | Status: DC
  Administered 2013-04-14 – 2013-04-18 (×5): 10 mg via ORAL
  Filled 2013-04-14 (×5): qty 1

## 2013-04-14 MED ORDER — ONDANSETRON HCL 4 MG PO TABS: 4.0000 mg | ORAL_TABLET | ORAL | Status: DC | PRN

## 2013-04-14 MED ORDER — DOCUSATE SODIUM 100 MG PO CAPS
100.0000 mg | ORAL_CAPSULE | Freq: Two times a day (BID) | ORAL | Status: DC
  Administered 2013-04-14 – 2013-04-18 (×9): 100 mg via ORAL
  Filled 2013-04-14 (×11): qty 1

## 2013-04-14 NOTE — ED Notes (Signed)
PIV placement obtained  Labs drawn and sent Patient medicated, see MAR Patient placed on monitor  Patient's family at bedside  Side rails up, call bell in reach

## 2013-04-14 NOTE — ED Notes (Signed)
Admitting MD at bedside to speak with patient.  

## 2013-04-14 NOTE — ED Notes (Signed)
Melissa, RN with IV team called and made aware of need for PIV and labs

## 2013-04-14 NOTE — ED Provider Notes (Signed)
CSN: [id number]     Arrival date & time 04/13/13  2343 History   First MD Initiated Contact with Patient 04/13/13 2357     Chief Complaint  Patient presents with  . Sickle Cell Pain Crisis   (Consider location/radiation/quality/duration/timing/severity/associated sxs/prior Treatment) HPI HX per PT - Sickle cell patient with pain crisis since yesterday, evaluated in the ER yesterday for leg pain and CP typical for his sickle cell pains and returned today for the same. He had some improvement with IV medications and was discharged home again today. Tonight brought in by family with persistent severe pain mostly in his legs, sharp in quality and not radiating. No SOB, no F/C. No rash, no recent travel. No known alleviating factors.   Past Medical History  Diagnosis Date  . Sickle cell anemia     Hgb SS disease   . Diabetes mellitus   . Hypertension    Past Surgical History  Procedure Laterality Date  . Colon surgery      Colon cancer surgery, removed small amt not full colectomy  . Cholecystectomy    . Leg surgery      For ? gangrene after running into barbwire fence at 62yo  . Colonoscopy      nclear who follows him for this   Family History  Problem Relation Age of Onset  . Venous thrombosis Mother     Dec. of blood clot   . Throat cancer Father     Deceased of throat cancer   History  Substance Use Topics  . Smoking status: Former Smoker -- 0.50 packs/day for 20 years    Quit date: 06/05/1966  . Smokeless tobacco: Not on file  . Alcohol Use: Yes    Review of Systems  Constitutional: Negative for fever and chills.  Eyes: Negative for pain.  Respiratory: Negative for shortness of breath.   Cardiovascular: Positive for chest pain.  Gastrointestinal: Negative for vomiting and abdominal pain.  Genitourinary: Negative for dysuria.  Musculoskeletal: Negative for neck pain and neck stiffness.  Skin: Negative for rash.  Neurological: Negative for headaches.  All other  systems reviewed and are negative.    Allergies  Morphine and related  Home Medications   Current Outpatient Rx  Name  Route  Sig  Dispense  Refill  . amLODipine-benazepril (LOTREL) 10-20 MG per capsule   Oral   Take 1 capsule by mouth daily.         . folic acid (FOLVITE) 1 MG tablet   Oral   Take 1 tablet (1 mg total) by mouth daily.   30 tablet   0   . gabapentin (NEURONTIN) 100 MG capsule   Oral   Take 100 mg by mouth 3 (three) times daily.         Marland Kitchen glipiZIDE (GLUCOTROL XL) 10 MG 24 hr tablet   Oral   Take 10 mg by mouth daily.         Marland Kitchen HYDROcodone-acetaminophen (NORCO/VICODIN) 5-325 MG per tablet   Oral   Take 2 tablets by mouth every 4 (four) hours as needed for moderate pain.         . metFORMIN (GLUCOPHAGE) 1000 MG tablet   Oral   Take 1,000 mg by mouth 2 (two) times daily with a meal.         . naproxen sodium (ANAPROX) 220 MG tablet   Oral   Take 440 mg by mouth daily.         Marland Kitchen omeprazole (  PRILOSEC) 20 MG capsule   Oral   Take 20 mg by mouth daily.          BP 150/92  Pulse 111  Temp(Src) 99.8 F (37.7 C) (Oral)  Resp 18  SpO2 95% Physical Exam  Constitutional: He is oriented to person, place, and time. He appears well-developed and well-nourished.  HENT:  Head: Normocephalic and atraumatic.  Eyes: EOM are normal. Pupils are equal, round, and reactive to light.  Neck: Neck supple.  Cardiovascular: Normal rate, regular rhythm and intact distal pulses.   Pulmonary/Chest: Effort normal and breath sounds normal. No respiratory distress. He has no wheezes. He has no rales. He exhibits no tenderness.  Abdominal: Soft. Bowel sounds are normal. He exhibits no distension. There is no tenderness.  Musculoskeletal: Normal range of motion. He exhibits no edema.  Mild tenderness bilateral anterior thighs, no rash or bony deformity.   Neurological: He is alert and oriented to person, place, and time.  Skin: Skin is warm and dry.    ED  Course  Procedures (including critical care time) Labs Review Labs Reviewed  CBC - Abnormal; Notable for the following:    RBC 3.63 (*)    Hemoglobin 8.4 (*)    HCT 24.9 (*)    MCV 68.6 (*)    MCH 23.1 (*)    RDW 17.2 (*)    All other components within normal limits  RETICULOCYTES - Abnormal; Notable for the following:    Retic Ct Pct 8.2 (*)    RBC. 3.63 (*)    Retic Count, Manual 297.7 (*)    All other components within normal limits  COMPREHENSIVE METABOLIC PANEL  TROPONIN I   Imaging Review Dg Chest 2 View  04/13/2013   CLINICAL DATA:  Sickle cell pain crisis, chest and body pain.  EXAM: CHEST  2 VIEW  COMPARISON:  04/12/2013  FINDINGS: The heart size and mediastinal contours are within normal limits. Both lungs are clear. There is patchy sclerosis and lucency in the humeral heads bilaterally as can be seen with avascular necrosis.  IMPRESSION: No active cardiopulmonary disease.   Electronically Signed   By: Kathreen Devoid   On: 04/13/2013 13:59   Dg Chest Portable 1 View  04/12/2013   CLINICAL DATA:  Mid chest pain for 2 hr, shortness of breath  EXAM: PORTABLE CHEST - 1 VIEW  COMPARISON:  04/12/2012.  FINDINGS: The heart size and mediastinal contours are within normal limits. Both lungs are clear. The visualized skeletal structures are unremarkable.  IMPRESSION: No active disease.   Electronically Signed   By: Kathreen Devoid   On: 04/12/2013 09:10    Date: 04/14/2013  Rate: 90  Rhythm: normal sinus rhythm  QRS Axis: normal  Intervals: normal  ST/T Wave abnormalities: nonspecific ST/T changes  Conduction Disutrbances:nonspecific intraventricular conduction delay  Narrative Interpretation:   Old EKG Reviewed: unchanged  IVFs, O2, IV narcotics MED consult - Dr Roel Cluck to admit  MDM  Dx: CP, Sickle Cell Crisis  ECG, labs CXR and prior labs from earlier today reviewed IVfs, oxygen Admit   Teressa Lower, MD 04/14/13 [phone]

## 2013-04-14 NOTE — Progress Notes (Signed)
Utilization review completed.  

## 2013-04-14 NOTE — Progress Notes (Signed)
Patient seen and examined earlier today. Admitted today for a sickle cell crises and CP. Does not have acute chest syndrome. All troponins are negative. Will DC telemetry. Will increase dilaudid to 2 mg q 2h. Will continue to follow.  Domingo Mend, MD [facility] Pager: [phone]

## 2013-04-14 NOTE — H&P (Signed)
PCP: [facility]    Chief Complaint:  Leg pain  HPI: [name] is a 62 y.o. male   has a past medical history of Sickle cell anemia; Diabetes mellitus; and Hypertension.   Presented with  3 day hx of severe thigh pain and some chest pain typical for his sickle cell crisis. Family brought him to ER yesterday and gave him prescriptions for hydrocodone. He has taken some at home but it was not controling his pain. He was treated with dilaudid with some relieve. Reports a bit of chest pain.   Review of Systems:    Pertinent positives include: thigh pain, nausea, vomiting, chest pain,  shortness of breath at rest. non-productive cough,  Constitutional:  No weight loss, night sweats, Fevers, chills, fatigue, weight loss  HEENT:  No headaches, Difficulty swallowing,Tooth/dental problems,Sore throat,  No sneezing, itching, ear ache, nasal congestion, post nasal drip,  Cardio-vascular:  No Orthopnea, PND, anasarca, dizziness, palpitations.no Bilateral lower extremity swelling  GI:  No heartburn, indigestion, abdominal pain, , diarrhea, change in bowel habits, loss of appetite, melena, blood in stool, hematemesis Resp:  noNo dyspnea on exertion, No excess mucus, no productive cough, No  No coughing up of blood.No change in color of mucus.No wheezing. Skin:  no rash or lesions. No jaundice GU:  no dysuria, change in color of urine, no urgency or frequency. No straining to urinate.  No flank pain.  Musculoskeletal:  No joint pain or no joint swelling. No decreased range of motion. No back pain.  Psych:  No change in mood or affect. No depression or anxiety. No memory loss.  Neuro: no localizing neurological complaints, no tingling, no weakness, no double vision, no gait abnormality, no slurred speech, no confusion  Otherwise ROS are negative except for above, 10 systems were reviewed  Past Medical History: Past Medical History  Diagnosis Date  . Sickle cell anemia     Hgb SS  disease   . Diabetes mellitus   . Hypertension    Past Surgical History  Procedure Laterality Date  . Colon surgery      Colon cancer surgery, removed small amt not full colectomy  . Cholecystectomy    . Leg surgery      For ? gangrene after running into barbwire fence at 62yo  . Colonoscopy      nclear who follows him for this     Medications: Prior to Admission medications   Medication Sig Start Date End Date Taking? Authorizing Provider  amLODipine-benazepril (LOTREL) 10-20 MG per capsule Take 1 capsule by mouth daily.   Yes Historical Provider, MD  folic acid (FOLVITE) 1 MG tablet Take 1 tablet (1 mg total) by mouth daily. 04/14/12  Yes Domenic Polite, MD  gabapentin (NEURONTIN) 100 MG capsule Take 100 mg by mouth 3 (three) times daily.   Yes Historical Provider, MD  glipiZIDE (GLUCOTROL XL) 10 MG 24 hr tablet Take 10 mg by mouth daily.   Yes Historical Provider, MD  HYDROcodone-acetaminophen (NORCO/VICODIN) 5-325 MG per tablet Take 2 tablets by mouth every 4 (four) hours as needed for moderate pain. 04/12/13  Yes Lauren Burnetta Sabin, PA-C  metFORMIN (GLUCOPHAGE) 1000 MG tablet Take 1,000 mg by mouth 2 (two) times daily with a meal.   Yes Historical Provider, MD  naproxen sodium (ANAPROX) 220 MG tablet Take 440 mg by mouth daily.   Yes Historical Provider, MD  omeprazole (PRILOSEC) 20 MG capsule Take 20 mg by mouth daily.   Yes Historical Provider, MD  Allergies:   Allergies  Allergen Reactions  . Morphine And Related Itching and Swelling    Social History:  Ambulatory  Independently  Lives at   Home with family   reports that he quit smoking about 46 years ago. He does not have any smokeless tobacco history on file. He reports that he drinks alcohol. He reports that he uses illicit drugs (Cocaine).   Family History: family history includes Diabetes type II in his mother; Throat cancer in his father; Venous thrombosis in his mother.    Physical Exam: Patient Vitals  for the past 24 hrs:  BP Temp Temp src Pulse Resp SpO2  04/14/13 0108 149/89 mmHg - - 98 20 96 %  04/13/13 2346 150/92 mmHg 99.8 F (37.7 C) Oral 111 18 95 %    1. General:  in No Acute distress 2. Psychological: Alert and   Oriented 3. Head/ENT:   Dry Mucous Membranes                          Head Non traumatic, neck supple                          Normal Dentition 4. SKIN:  decreased Skin turgor,  Skin clean Dry and intact no rash 5. Heart: Regular rate and rhythm no Murmur, Rub or gallop 6. Lungs: Clear to auscultation bilaterally, no wheezes or crackles   7. Abdomen: Soft, non-tender, distended 8. Lower extremities: no clubbing, cyanosis, or edema 9. Neurologically Grossly intact, moving all 4 extremities equally 10. MSK: Normal range of motion  body mass index is unknown because there is no weight on file.   Labs on Admission:   Recent Labs  04/12/13 0851 04/13/13 1415  NA 137 130*  K 4.5 4.6  CL 105 96  CO2  --  22  GLUCOSE 56* 248*  BUN 16 24*  CREATININE 1.80* 1.52*  CALCIUM  --  10.0    Recent Labs  04/13/13 1415  AST 58*  ALT 20  ALKPHOS 93  BILITOT 3.2*  PROT 7.9  ALBUMIN 4.2   No results found for this basename: LIPASE, AMYLASE,  in the last 72 hours  Recent Labs  04/12/13 0841  04/13/13 1415 04/14/13 0100  WBC 24.5*  --  22.5* PENDING  NEUTROABS 19.2*  --  19.3*  --   HGB 8.5*  < > 8.0* 8.4*  HCT 25.2*  < > 23.7* 24.9*  MCV 70.0*  --  68.7* 68.6*  PLT 298  --  219 PENDING  < > = values in this interval not displayed.  Recent Labs  04/13/13 1415  TROPONINI <0.30   No results found for this basename: TSH, T4TOTAL, FREET3, T3FREE, THYROIDAB,  in the last 72 hours  Recent Labs  04/13/13 1415 04/14/13 0100  RETICCTPCT 8.6* 8.2*   Lab Results  Component Value Date   HGBA1C 5.0 07/28/2011    The CrCl is unknown because both a height and weight (above a minimum accepted value) are required for this calculation. ABG    Component  Value Date/Time   HCO3 23.1 02/24/2007 1647   TCO2 16 04/12/2013 0851     No results found for this basename: DDIMER     Other results:  I have pearsonaly reviewed this: ECG REPORT  Rate 90  Rhythm: LBBB ST&T Change: NA   Cultures:    Component Value Date/Time   SDES  BLOOD RIGHT HAND 04/12/2012 1703   SPECREQUEST BOTTLES DRAWN AEROBIC ONLY 2CC 04/12/2012 1703   CULT NO GROWTH 5 DAYS 04/12/2012 1703   REPTSTATUS 04/19/2012 FINAL 04/12/2012 1703       Radiological Exams on Admission: Dg Chest 2 View  04/13/2013   CLINICAL DATA:  Sickle cell pain crisis, chest and body pain.  EXAM: CHEST  2 VIEW  COMPARISON:  04/12/2013  FINDINGS: The heart size and mediastinal contours are within normal limits. Both lungs are clear. There is patchy sclerosis and lucency in the humeral heads bilaterally as can be seen with avascular necrosis.  IMPRESSION: No active cardiopulmonary disease.   Electronically Signed   By: Kathreen Devoid   On: 04/13/2013 13:59   Dg Chest Portable 1 View  04/12/2013   CLINICAL DATA:  Mid chest pain for 2 hr, shortness of breath  EXAM: PORTABLE CHEST - 1 VIEW  COMPARISON:  04/12/2012.  FINDINGS: The heart size and mediastinal contours are within normal limits. Both lungs are clear. The visualized skeletal structures are unremarkable.  IMPRESSION: No active disease.   Electronically Signed   By: Kathreen Devoid   On: 04/12/2013 09:10    Chart has been reviewed  Assessment/Plan  34-year-old gentleman history of sickle cell disease here sickle cell crisis as well as mild chest pain  Present on Admission:  . Vasoocclusive sickle cell crisis - and chest pain will admit to telemetry for cycle cardiac enzymes rehydrate and control pain with Dilaudid as needed he will need to have followup as an outpatient and sickle cell  . Leukocytosis - evidence of infection at this point we'll continue to monitor  . Hypertension - hold lisinopril given worsening renal function continue Norvasc   . Diabetes mellitus - sliding scale hold metformin  . Chest pain - cycle cardiac markers obtained serial. EKG current resolved Elevated creatinine up from baseline patient appears to be dehydrated we'll give IV fluids and follow  Prophylaxis: Lovenox, Protonix  CODE STATUS: FULL CODE  Other plan as per orders.  I have spent a total of 55 min on this admission  Kaitlynn Tramontana 04/14/2013, 1:44 AM

## 2013-04-14 NOTE — Progress Notes (Signed)
Lab unable to get blood specimen tried to stick patient 3x , MD made aware.

## 2013-04-14 NOTE — ED Notes (Signed)
Attempting PIV placement Will medicate when PIV obtained Charge Nurse at bedside

## 2013-04-15 DIAGNOSIS — D696 Thrombocytopenia, unspecified: Secondary | ICD-10-CM | POA: Diagnosis present

## 2013-04-15 LAB — GLUCOSE, CAPILLARY
Glucose-Capillary: 134 mg/dL — ABNORMAL HIGH (ref 70–99)
Glucose-Capillary: 208 mg/dL — ABNORMAL HIGH (ref 70–99)
Glucose-Capillary: 135 mg/dL — ABNORMAL HIGH (ref 70–99)

## 2013-04-15 LAB — PREPARE RBC (CROSSMATCH)

## 2013-04-15 LAB — BASIC METABOLIC PANEL
Calcium: 8.8 mg/dL (ref 8.4–10.5)
GFR calc Af Amer: 51 mL/min — ABNORMAL LOW (ref >90)
GFR calc non Af Amer: 44 mL/min — ABNORMAL LOW (ref >90)
Glucose, Bld: 202 mg/dL — ABNORMAL HIGH (ref 70–99)
Potassium: 4.7 mEq/L (ref 3.5–5.1)
Sodium: 136 mEq/L (ref 135–145)

## 2013-04-15 LAB — CBC
MCH: 23.2 pg — ABNORMAL LOW (ref 26.0–34.0)
MCV: 68.4 fL — ABNORMAL LOW (ref 78.0–100.0)
Platelets: 114 10*3/uL — ABNORMAL LOW (ref 150–400)
RBC: 2.37 MIL/uL — ABNORMAL LOW (ref 4.22–5.81)
WBC: 23.2 10*3/uL — ABNORMAL HIGH (ref 4.0–10.5)

## 2013-04-15 LAB — RAPID URINE DRUG SCREEN, HOSP PERFORMED
Benzodiazepines: NOT DETECTED
Cocaine: POSITIVE — AB

## 2013-04-15 LAB — LACTATE DEHYDROGENASE: LDH: 760 U/L — ABNORMAL HIGH (ref 94–250)

## 2013-04-15 LAB — FERRITIN: Ferritin: 1582 ng/mL — ABNORMAL HIGH (ref 22–322)

## 2013-04-15 MED ORDER — ACETAMINOPHEN 325 MG PO TABS
650.0000 mg | ORAL_TABLET | Freq: Four times a day (QID) | ORAL | Status: DC | PRN
  Administered 2013-04-16: 650 mg via ORAL
  Filled 2013-04-15: qty 2

## 2013-04-15 MED ORDER — SENNOSIDES-DOCUSATE SODIUM 8.6-50 MG PO TABS
1.0000 | ORAL_TABLET | Freq: Two times a day (BID) | ORAL | Status: DC
  Administered 2013-04-15 – 2013-04-18 (×7): 1 via ORAL
  Filled 2013-04-15 (×7): qty 1

## 2013-04-15 NOTE — Progress Notes (Addendum)
[facility] PROGRESS NOTE  [name] [id number] DOB: 06-23-1950 DOA: 04/13/2013 PCP: Ricke Hey, MD  Brief narrative: 62 -year-old male with past medical history significant for sickle cell disease and related anemia, drug abuse, diabetes, hypertension, chronic kidney disease stage III who presented to [facility] ED 04/13/2013 with reports of severe thigh pain and chest pain typical of his usual sickle cell crisis. Patient took hydrocodone at home with no significant symptomatic relief. In ED, vitals were stable. White blood cell count was elevated at 24.5, hemoglobin 8.5 and platelets were within normal limits. Chest x-ray showed no active cardiopulmonary disease. The 12-lead EKG showed normal sinus rhythm. Cardiac enzymes so far are all negative for total of 3 sets. Over past 24 hours hemoglobin has dropped from 7.2 to 5.5. Patient will receive one unit of blood today 04/15/2013.  Assessment and Plan:  Principal Problem:   Vasoocclusive sickle cell crisis - Chest pain resolved, cardiac enzymes are negative for total of 3 sets, the 12-lead EKG showed normal sinus rhythm on the admission - Pain management: Continue Dilaudid 2 mg IV every 2 hours as needed for severe pain and hydrocodone every 4 hours as needed by mouth for moderate pain  - please note that NSAIDs contraindicated secondary to chronic kidney disease.  - Adjuvant pain therapy: Gabapentin.  - Wean IV narcotics when pain rated 7/10. Today pain is 8/10.  - When able to tolerate oral therapy, convert at 50-75% of IV dose & give PRNs Q 2-3 hours.  - Monitor CBC now daily due to drop in hemoglobin from 7.2 to 5.5. Pt to receive 1 unit PRBC today - Monitor bilirubin/LDH Q 72 hours to monitor hemolysis. Check LDH, ferritin today  - Reticulocyte count 297 on admission, no evidence of aplastic anemia.   - K pad PRN.  - Continue folic acid.  - Obtain urine drug screen as patient had history of cocaine abuse in recent  past. Active Problems:   Acute blood loss anemia - Likely related to sickle cell crisis - Hemoglobin 5.5 this morning - We will transfuse 1 unit of PRBC today   Hypertension - Continue Norvasc 10 mg daily   Diabetes mellitus - A1c is within normal limits on this admission - CBGs in past 24 hours are 190, 143 and 165   Leukocytosis - Likely reactive. Chest x-ray on admission did not show acute cardiopulmonary disease - Patient did have a low-grade fever overnight 100.1. Should patient spike fever again please obtain urinalysis, urine culture, blood cultures and consider antibiotic treatment   CKD (chronic kidney disease), stage III - Creatinine was 1.8 on the admission and is trending down   Thrombocytopenia, unspecified - Likely secondary to Lovenox. Discontinue Lovenox and use SCD for DVT prophylaxis   Code Status: Full Family Communication: Pt at bedside Disposition Plan: Home when medically stable  Consultants:  None   Procedures/Studies: Dg Chest 2 View 04/13/2013    IMPRESSION: No active cardiopulmonary disease.      Dg Abd 1 View 04/14/2013    IMPRESSION: 1. No evidence of bowel distention. Stool is noted throughout the colon. 2. Sclerotic osseous changes consistent with patient's known sickle cell disease.     Antibiotics:  None     HPI/Subjective: No events overnight.   Objective: Filed Vitals:   04/15/13 0150 04/15/13 0545 04/15/13 0830 04/15/13 0855  BP: 153/86 167/81 154/85 147/86  Pulse: 102 98 98 94  Temp: 99.4 F (37.4 C) 99.1 F (37.3 C) 99.6 F (37.6  C) 100.1 F (37.8 C)  TempSrc: Oral Oral Oral Oral  Resp: 20 17 16 14   Height:      Weight:      SpO2: 95% 95% 96% 99%    Intake/Output Summary (Last 24 hours) at 04/15/13 0932 Last data filed at 04/15/13 0600  Gross per 24 hour  Intake   3650 ml  Output   1000 ml  Net   2650 ml    Exam:   General:  Pt is alert, follows commands appropriately, not in acute distress  Cardiovascular:  Regular rate and rhythm, S1/S2 appreciated  Respiratory: Clear to auscultation bilaterally, no wheezing, no crackles, no rhonchi  Abdomen: Soft, non tender, non distended, bowel sounds present, no guarding  Extremities: No edema, pulses DP and PT palpable bilaterally  Neuro: Grossly nonfocal  Data Reviewed: Basic Metabolic Panel:  Recent Labs Lab 04/12/13 0851 04/13/13 1415 04/14/13 0100 04/14/13 0650 04/15/13 0340  NA 137 130* 132* 133* 136  K 4.5 4.6 4.2 4.5 4.7  CL 105 96 98 102 105  CO2  --  22 24 20 20   GLUCOSE 56* 248* 168* 162* 202*  BUN 16 24* 22 19 23   CREATININE 1.80* 1.52* 1.58* 1.42* 1.61*  CALCIUM  --  10.0 10.1 9.3 8.8  MG  --   --   --  2.1  --    Liver Function Tests:  Recent Labs Lab 04/13/13 1415 04/14/13 0100 04/14/13 0650  AST 58* 55* 49*  ALT 20 22 18   ALKPHOS 93 112 95  BILITOT 3.2* 3.3* 1.9*  PROT 7.9 8.4* 6.8  ALBUMIN 4.2 4.4 3.4*   No results found for this basename: LIPASE, AMYLASE,  in the last 168 hours No results found for this basename: AMMONIA,  in the last 168 hours CBC:  Recent Labs Lab 04/12/13 0841 04/12/13 0851 04/13/13 1415 04/14/13 0100 04/14/13 0650 04/15/13 0340  WBC 24.5*  --  22.5* 20.6* 22.4* 23.2*  NEUTROABS 19.2*  --  19.3*  --  17.4*  --   HGB 8.5* 10.2* 8.0* 8.4* 7.2* 5.5*  HCT 25.2* 30.0* 23.7* 24.9* 21.6* 16.2*  MCV 70.0*  --  68.7* 68.6* 68.8* 68.4*  PLT 298  --  219 207 172 114*   Cardiac Enzymes:  Recent Labs Lab 04/13/13 1415 04/14/13 0100 04/14/13 0650  TROPONINI <0.30 <0.30 <0.30   BNP: No components found with this basename: POCBNP,  CBG:  Recent Labs Lab 04/14/13 0753 04/14/13 1202 04/14/13 1653 04/14/13 2037 04/15/13 0718  GLUCAP 190* 143* 165* 103* 134*    No results found for this or any previous visit (from the past 240 hour(s)).   Scheduled Meds: . ALPRAZolam  0.25-0.5 mg Oral TID  . amLODipine  10 mg Oral Daily  . docusate sodium  100 mg Oral BID  . folic acid  1  mg Oral Daily  . gabapentin  100 mg Oral TID  . insulin aspart  0-15 Units Subcutaneous TID WC  . insulin aspart  0-5 Units Subcutaneous QHS  . naproxen  500 mg Oral Q breakfast  . pantoprazole  40 mg Oral Daily  . senna-docusate  1 tablet Oral BID   Continuous Infusions: . sodium chloride 125 mL/hr at 04/14/13 2300     Faye Ramsay, MD  [facility] Pager [phone]  If 7PM-7AM, please contact night-coverage [email or web address] Password [email or web address] 04/15/2013, 9:32 AM   LOS: 2 days

## 2013-04-15 NOTE — Progress Notes (Signed)
CRITICAL VALUE ALERT  Critical value received:HGB 5.5  Date of notification: 04/15/13  Time of notification:  0407  Critical value read back:YES  Nurse who received alert:Shron Ozer,Malijah Lietz  MD notified (1st page): K.[email or web address]  Time of first page:  0426  MD notified (2nd page):  Time of second page:  Responding MD:K.[email or web address]  Time MD responded:  [phone]

## 2013-04-16 DIAGNOSIS — D571 Sickle-cell disease without crisis: Secondary | ICD-10-CM

## 2013-04-16 DIAGNOSIS — M79609 Pain in unspecified limb: Secondary | ICD-10-CM

## 2013-04-16 DIAGNOSIS — D72829 Elevated white blood cell count, unspecified: Secondary | ICD-10-CM

## 2013-04-16 LAB — GLUCOSE, CAPILLARY
Glucose-Capillary: 182 mg/dL — ABNORMAL HIGH (ref 70–99)
Glucose-Capillary: 142 mg/dL — ABNORMAL HIGH (ref 70–99)

## 2013-04-16 LAB — BASIC METABOLIC PANEL
BUN: 18 mg/dL (ref 6–23)
CO2: 23 mEq/L (ref 19–32)
Chloride: 110 mEq/L (ref 96–112)
GFR calc Af Amer: 52 mL/min — ABNORMAL LOW (ref >90)
GFR calc non Af Amer: 45 mL/min — ABNORMAL LOW (ref >90)
Potassium: 4.1 mEq/L (ref 3.5–5.1)
Sodium: 139 mEq/L (ref 135–145)

## 2013-04-16 LAB — PREPARE RBC (CROSSMATCH)

## 2013-04-16 LAB — CBC
HCT: 17.4 % — ABNORMAL LOW (ref 39.0–52.0)
MCHC: 34.5 g/dL (ref 30.0–36.0)
Platelets: 159 10*3/uL (ref 150–400)
RBC: 2.46 MIL/uL — ABNORMAL LOW (ref 4.22–5.81)
RDW: 20.9 % — ABNORMAL HIGH (ref 11.5–15.5)
WBC: 18.4 10*3/uL — ABNORMAL HIGH (ref 4.0–10.5)

## 2013-04-16 MED ORDER — HYDRALAZINE HCL 20 MG/ML IJ SOLN
10.0000 mg | Freq: Once | INTRAMUSCULAR | Status: AC
  Administered 2013-04-16: 10 mg via INTRAVENOUS
  Filled 2013-04-16: qty 1

## 2013-04-16 NOTE — Progress Notes (Signed)
Patient R hand more swollen, radial pulse +2,tighter per patient.Dr. Charlies Silvers notified order for doppler received.Also incresaed in BP noted,Md aware,order for Hydralazine received.Sandie Ano RN

## 2013-04-16 NOTE — Progress Notes (Addendum)
[facility] PROGRESS NOTE  LEELEND FORKER [id number] DOB: 1951/01/23 DOA: 04/13/2013 PCP: Ricke Hey, MD  Brief narrative: 62 -year-old male with past medical history significant for sickle cell disease and related anemia, drug abuse, diabetes, hypertension, chronic kidney disease stage III who presented to [facility] ED 04/13/2013 with reports of severe thigh pain and chest pain typical of his usual sickle cell crisis. Patient took hydrocodone at home with no significant symptomatic relief.  In ED, vitals were stable. White blood cell count was elevated at 24.5, hemoglobin 8.5 and platelets were within normal limits. Chest x-ray showed no active cardiopulmonary disease. The 12-lead EKG showed normal sinus rhythm. Cardiac enzymes so far are all negative for total of 3 sets. Over past 24 hours hemoglobin has dropped from 7.2 to 5.5. Patient has received a total of 2 units of PRBC, 11/11 and 11/12.  Assessment and Plan:   Principal Problem:  Vasoocclusive sickle cell crisis  - Chest pain resolved, cardiac enzymes are negative for total of 3 sets, the 12-lead EKG showed normal sinus rhythm on the admission  - Pain management: Continue Dilaudid 2 mg IV every 2 hours as needed for severe pain and hydrocodone every 4 hours as needed by mouth for moderate pain  - please note that NSAIDs contraindicated secondary to chronic kidney disease.  - Adjuvant pain therapy: Gabapentin.  - Wean IV narcotics when pain rated 7/10. Today pain is 8/10.  - When able to tolerate oral therapy, will convert at 50-75% of IV dose & give PRNs Q 2-3 hours.  - Monitor CBC daily due to drop in hemoglobin from 7.2 to 5.5. Pt received 1 unit PRBC 11/11 and Hgb 6. Will give 1 more unit PRBC today - Monitor bilirubin/LDH Q 72 hours to monitor hemolysis.  LDH 760, ferritin  1582 - all checked 04/15/2013. Start desferal today - Reticulocyte count 297 on admission, no evidence of aplastic anemia.  - K pad PRN.  - Continue  folic acid.  - UDS pos for cocaine  Active Problems:  Acute blood loss anemia  - Likely related to sickle cell crisis  - Hemoglobin 6 today; will give another unit of PRBC today - Transfused 1 unit of PRBC 04/15/2013 Hypertension  - Continue Norvasc 10 mg daily  Diabetes mellitus  - A1c is within normal limits on this admission  - CBGs in past 24 hours 211, 125, 182 Leukocytosis  - Likely reactive. Chest x-ray on admission did not show acute cardiopulmonary disease  - Patient did have a low-grade fever overnight 100.3. Continue to monitor fever curve. CKD (chronic kidney disease), stage III  - Creatinine was 1.8 on the admission and is trending down  Thrombocytopenia, unspecified  - Likely secondary to Lovenox. Discontinued Lovenox and use SCD for DVT prophylaxis; Platelet now WNL  Code Status: Full  Family Communication: No family at the bedside Disposition Plan: Home when medically stable   Consultants:  None   Procedures/Studies:  Dg Chest 2 View 04/13/2013 IMPRESSION: No active cardiopulmonary disease.  Dg Abd 1 View 04/14/2013 IMPRESSION: 1. No evidence of bowel distention. Stool is noted throughout the colon. 2. Sclerotic osseous changes consistent with patient's known sickle cell disease.   Antibiotics:  None   Leisa Lenz, MD  [facility] Pager [phone]  If 7PM-7AM, please contact night-coverage [email or web address] Password [facility] 04/16/2013, 12:36 PM   LOS: 3 days    HPI/Subjective: No acute overnight events  Objective: Filed Vitals:   04/16/13 0223 04/16/13 0602 04/16/13 1000 04/16/13  1021  BP: 160/87 148/92 151/76 151/76  Pulse: 95 95 89   Temp: 98.4 F (36.9 C) 98.4 F (36.9 C) 99.5 F (37.5 C)   TempSrc: Oral Oral Oral   Resp: 20 20 16    Height:      Weight:      SpO2: 100% 100% 92%     Intake/Output Summary (Last 24 hours) at 04/16/13 1236 Last data filed at 04/16/13 1000  Gross per 24 hour  Intake    720 ml  Output   2325 ml  Net   -1605 ml    Exam:   General:  Pt is alert, follows commands appropriately, not in acute distress  Cardiovascular: Tachycardic, S1/S2 appreciated, systolic ejection murmur [id number] appreciated  Respiratory: Clear to auscultation bilaterally, no wheezing, no crackles, no rhonchi  Abdomen: Soft, non tender, non distended, bowel sounds present, no guarding  Extremities: No edema, pulses DP and PT palpable bilaterally  Neuro: Grossly nonfocal  Data Reviewed: Basic Metabolic Panel:  Recent Labs Lab 04/13/13 1415 04/14/13 0100 04/14/13 0650 04/15/13 0340 04/16/13 1005  NA 130* 132* 133* 136 139  K 4.6 4.2 4.5 4.7 4.1  CL 96 98 102 105 110  CO2 22 24 20 20 23   GLUCOSE 248* 168* 162* 202* 205*  BUN 24* 22 19 23 18   CREATININE 1.52* 1.58* 1.42* 1.61* 1.56*  CALCIUM 10.0 10.1 9.3 8.8 8.9  MG  --   --  2.1  --   --    Liver Function Tests:  Recent Labs Lab 04/13/13 1415 04/14/13 0100 04/14/13 0650  AST 58* 55* 49*  ALT 20 22 18   ALKPHOS 93 112 95  BILITOT 3.2* 3.3* 1.9*  PROT 7.9 8.4* 6.8  ALBUMIN 4.2 4.4 3.4*   No results found for this basename: LIPASE, AMYLASE,  in the last 168 hours No results found for this basename: AMMONIA,  in the last 168 hours CBC:  Recent Labs Lab 04/12/13 0841  04/13/13 1415 04/14/13 0100 04/14/13 0650 04/15/13 0340 04/16/13 1005  WBC 24.5*  --  22.5* 20.6* 22.4* 23.2* 18.4*  NEUTROABS 19.2*  --  19.3*  --  17.4*  --   --   HGB 8.5*  < > 8.0* 8.4* 7.2* 5.5* 6.0*  HCT 25.2*  < > 23.7* 24.9* 21.6* 16.2* 17.4*  MCV 70.0*  --  68.7* 68.6* 68.8* 68.4* 70.7*  PLT 298  --  219 207 172 114* 159  < > = values in this interval not displayed. Cardiac Enzymes:  Recent Labs Lab 04/13/13 1415 04/14/13 0100 04/14/13 0650  TROPONINI <0.30 <0.30 <0.30   BNP: No components found with this basename: POCBNP,  CBG:  Recent Labs Lab 04/15/13 1128 04/15/13 1738 04/15/13 2244 04/16/13 0730 04/16/13 1150  GLUCAP 208* 135* 211* 125* 182*     No results found for this or any previous visit (from the past 240 hour(s)).   Studies: No results found.  Scheduled Meds: . ALPRAZolam  0.25-0.5 mg Oral TID  . amLODipine  10 mg Oral Daily  . docusate sodium  100 mg Oral BID  . folic acid  1 mg Oral Daily  . gabapentin  100 mg Oral TID  . insulin aspart  0-15 Units Subcutaneous TID WC  . insulin aspart  0-5 Units Subcutaneous QHS  . pantoprazole  40 mg Oral Daily  . senna-docusate  1 tablet Oral BID   Continuous Infusions: . sodium chloride 125 mL/hr at 04/16/13 0458

## 2013-04-16 NOTE — Progress Notes (Signed)
CRITICAL VALUE ALERT  Critical value received:  Hemoglobin 6.0  Date of notification:  04/16/13  Time of notification:  [id number]  Critical value read back:yes  Nurse who received alert:  Sandie Ano  MD notified (1st page):  [email or web address]  Time of first page:  1100  MD notified (2nd page):  Time of second page:  Responding MD: Dr. Charlies Silvers  Time MD responded:  1102

## 2013-04-16 NOTE — Progress Notes (Signed)
VASCULAR LAB PRELIMINARY  PRELIMINARY  PRELIMINARY  PRELIMINARY  Bilateral upper extremity venous duplex completed.    Preliminary report:  Bilateral:  No evidence of DVT or superficial thrombosis.  Baneza Bartoszek, RVT 04/16/2013, 4:53 PM

## 2013-04-16 NOTE — Progress Notes (Signed)
Inpatient Diabetes Program Recommendations  AACE/ADA: New Consensus Statement on Inpatient Glycemic Control (2013)  Target Ranges:  Prepandial:   less than 140 mg/dL      Peak postprandial:   less than 180 mg/dL (1-2 hours)      Critically ill patients:  140 - 180 mg/dL   Reason for Visit: Results for [name], [name] (MRN [id number]) as of 04/16/2013 10:08  Ref. Range 04/15/2013 07:18 04/15/2013 11:28 04/15/2013 17:38 04/15/2013 22:44 04/16/2013 07:30  Glucose-Capillary Latest Range: 70-99 mg/dL 134 (H) 208 (H) 135 (H) 211 (H) 125 (H)   May consider adding Novolog meal coverage 3 units tid with meals (Hold if patient eats less than 50%).    Thanks, Adah Perl, RN, BC-ADM Inpatient Diabetes Coordinator Pager [phone]

## 2013-04-16 NOTE — Care Management Note (Signed)
   CARE MANAGEMENT NOTE 04/16/2013  Patient:  [name], [name]   Account Number:  [id number]  Date Initiated:  04/15/2013  Documentation initiated by:  Dessa Phi  Subjective/Objective Assessment:   62 Y/O M ADMITTED W/SSC.     Action/Plan:   FROM HOME.   Anticipated DC Date:  04/18/2013   Anticipated DC Plan:  [place]  CM consult      Choice offered to / List presented to:  NA   DME arranged  NA      DME agency  NA     [place] arranged  NA      [place] agency  NA   Status of service:  In process, will continue to follow Medicare Important Message given?   (If response is "NO", the following Medicare IM given date fields will be blank) Date Medicare IM given:   Date Additional Medicare IM given:    Discharge Disposition:    Per UR Regulation:  Reviewed for med. necessity/level of care/duration of stay  If discussed at [place] of Stay Meetings, dates discussed:    Comments:  04/16/13 [place] Chart reviewed for continued INPT stay. HGC 6.0. Prior to admission pt independent.PCP: [place]  No needs identified at this time.

## 2013-04-17 LAB — TYPE AND SCREEN
Antibody Screen: NEGATIVE
Unit division: 0

## 2013-04-17 LAB — GLUCOSE, CAPILLARY
Glucose-Capillary: 211 mg/dL — ABNORMAL HIGH (ref 70–99)
Glucose-Capillary: 177 mg/dL — ABNORMAL HIGH (ref 70–99)

## 2013-04-17 LAB — CBC
MCH: 25.1 pg — ABNORMAL LOW (ref 26.0–34.0)
MCHC: 33.9 g/dL (ref 30.0–36.0)
Platelets: 193 10*3/uL (ref 150–400)
RDW: 23 % — ABNORMAL HIGH (ref 11.5–15.5)

## 2013-04-17 MED ORDER — DEXTROSE 5 % IV SOLN
2000.0000 mg | INTRAVENOUS | Status: DC
  Administered 2013-04-17: 2000 mg via INTRAVENOUS
  Filled 2013-04-17 (×2): qty 2

## 2013-04-17 NOTE — Progress Notes (Addendum)
[facility] PROGRESS NOTE  [name] [id number] DOB: 1951-01-26 DOA: 04/13/2013 PCP: Ricke Hey, MD  Brief narrative: 62 -year-old male with past medical history significant for sickle cell disease and related anemia, drug abuse, diabetes, hypertension, chronic kidney disease stage III who presented to [facility] ED 04/13/2013 with reports of severe thigh pain and chest pain typical of his usual sickle cell crisis. Patient took hydrocodone at home with no significant symptomatic relief.  In ED, vitals were stable. White blood cell count was elevated at 24.5, hemoglobin 8.5 and platelets were within normal limits. Chest x-ray showed no active cardiopulmonary disease. The 12-lead EKG showed normal sinus rhythm. Cardiac enzymes so far are all negative for total of 3 sets. Over past 24 hours hemoglobin has dropped from 7.2 to 5.5. Patient has received a total of 2 units of PRBC, 11/11 and 11/12.   Assessment and Plan:   Principal Problem:  Vasoocclusive sickle cell crisis  - Chest pain resolved, cardiac enzymes are negative for total of 3 sets, the 12-lead EKG showed normal sinus rhythm on the admission  - Pain management: Continue Dilaudid 2 mg IV every 2 hours as needed for severe pain and hydrocodone every 4 hours as needed by mouth for moderate pain  - please note that NSAIDs contraindicated secondary to chronic kidney disease.  - Adjuvant pain therapy: Gabapentin.  - Wean IV narcotics when pain rated 7/10. Today pain is 8/10.  - When able to tolerate oral therapy, will convert at 50-75% of IV dose & give PRNs Q 2-3 hours.  - Monitor CBC daily due to drop in hemoglobin from 7.2 to 5.5. Pt received 1 unit PRBC 11/11 and 11/12. Hgb today 7.6 - Monitor bilirubin/LDH Q 72 hours to monitor hemolysis. LDH 760, ferritin 1582 - all checked 04/15/2013. Start desferal  - Reticulocyte count 297 on admission, no evidence of aplastic anemia.  - K pad PRN.  - Continue folic acid.  - UDS pos for  cocaine   Active Problems:  Acute blood loss anemia  - Likely related to sickle cell crisis  - Transfused 1 unit of PRBC 04/15/2013 and 04/16/2013 Hypertension  - Continue Norvasc 10 mg daily  Diabetes mellitus  - A1c is within normal limits on this admission  - CBGs in past 24 hours: 213, 158, 211 Leukocytosis  - Likely reactive. Chest x-ray on admission did not show acute cardiopulmonary disease  - Patient did have a low-grade fever overnight, 99.9 F CKD (chronic kidney disease), stage III  - Creatinine was 1.8 on the admission and is trending down  Thrombocytopenia, unspecified  - Likely secondary to Lovenox. Discontinued Lovenox and use SCD for DVT prophylaxis; Platelet now WNL   Code Status: Full  Family Communication: No family at the bedside Disposition Plan: Home when medically stable; needs PT evaluation   Consultants:  None   Procedures/Studies:  Dg Chest 2 View 04/13/2013 IMPRESSION: No active cardiopulmonary disease.  Dg Abd 1 View 04/14/2013 IMPRESSION: 1. No evidence of bowel distention. Stool is noted throughout the colon. 2. Sclerotic osseous changes consistent with patient's known sickle cell disease.   Antibiotics:  None   Leisa Lenz, MD  [facility] Pager [phone]  If 7PM-7AM, please contact night-coverage [email or web address] Password [email or web address] 04/17/2013, 7:00 AM   LOS: 4 days    HPI/Subjective: No acute overnight events.   Objective: Filed Vitals:   04/16/13 1800 04/16/13 2101 04/17/13 0251 04/17/13 0620  BP: 166/80 160/74 165/80 155/89  Pulse: 95 98 90 97  Temp: 98.3 F (36.8 C) 98.5 F (36.9 C)  99.1 F (37.3 C)  TempSrc: Oral Oral  Oral  Resp: 18 17  18   Height:      Weight:      SpO2: 100% 97%  95%    Intake/Output Summary (Last 24 hours) at 04/17/13 0700 Last data filed at 04/17/13 0434  Gross per 24 hour  Intake  492.5 ml  Output   1150 ml  Net -657.5 ml    Exam:   General:  Pt is sleeping, not in acute  distress  Cardiovascular: Regular rate and rhythm, S1/S2 appreciated   Respiratory: Clear to auscultation bilaterally, no wheezing, no crackles, no rhonchi  Abdomen: Soft, non tender, non distended, bowel sounds present, no guarding  Extremities: No edema, pulses DP and PT palpable bilaterally  Neuro: Grossly nonfocal  Data Reviewed: Basic Metabolic Panel:  Recent Labs Lab 04/13/13 1415 04/14/13 0100 04/14/13 0650 04/15/13 0340 04/16/13 1005  NA 130* 132* 133* 136 139  K 4.6 4.2 4.5 4.7 4.1  CL 96 98 102 105 110  CO2 22 24 20 20 23   GLUCOSE 248* 168* 162* 202* 205*  BUN 24* 22 19 23 18   CREATININE 1.52* 1.58* 1.42* 1.61* 1.56*  CALCIUM 10.0 10.1 9.3 8.8 8.9  MG  --   --  2.1  --   --    Liver Function Tests:  Recent Labs Lab 04/13/13 1415 04/14/13 0100 04/14/13 0650  AST 58* 55* 49*  ALT 20 22 18   ALKPHOS 93 112 95  BILITOT 3.2* 3.3* 1.9*  PROT 7.9 8.4* 6.8  ALBUMIN 4.2 4.4 3.4*   No results found for this basename: LIPASE, AMYLASE,  in the last 168 hours No results found for this basename: AMMONIA,  in the last 168 hours CBC:  Recent Labs Lab 04/12/13 0841  04/13/13 1415 04/14/13 0100 04/14/13 0650 04/15/13 0340 04/16/13 1005 04/17/13 0345  WBC 24.5*  --  22.5* 20.6* 22.4* 23.2* 18.4* 18.2*  NEUTROABS 19.2*  --  19.3*  --  17.4*  --   --   --   HGB 8.5*  < > 8.0* 8.4* 7.2* 5.5* 6.0* 7.6*  HCT 25.2*  < > 23.7* 24.9* 21.6* 16.2* 17.4* 22.4*  MCV 70.0*  --  68.7* 68.6* 68.8* 68.4* 70.7* 73.9*  PLT 298  --  219 207 172 114* 159 193  < > = values in this interval not displayed. Cardiac Enzymes:  Recent Labs Lab 04/13/13 1415 04/14/13 0100 04/14/13 0650  TROPONINI <0.30 <0.30 <0.30   BNP: No components found with this basename: POCBNP,  CBG:  Recent Labs Lab 04/15/13 2244 04/16/13 0730 04/16/13 1150 04/16/13 1725 04/16/13 2052  GLUCAP 211* 125* 182* 142* 213*    No results found for this or any previous visit (from the past 240  hour(s)).   Studies: No results found.  Scheduled Meds: . ALPRAZolam  0.25-0.5 mg Oral TID  . amLODipine  10 mg Oral Daily  . docusate sodium  100 mg Oral BID  . folic acid  1 mg Oral Daily  . gabapentin  100 mg Oral TID  . insulin aspart  0-15 Units Subcutaneous TID WC  . insulin aspart  0-5 Units Subcutaneous QHS  . pantoprazole  40 mg Oral Daily  . senna-docusate  1 tablet Oral BID

## 2013-04-17 NOTE — Progress Notes (Signed)
Pt's BP 174/77, L. Tetherow paged, no new orders received. Noreene Larsson RN

## 2013-04-18 LAB — GLUCOSE, CAPILLARY: Glucose-Capillary: 155 mg/dL — ABNORMAL HIGH (ref 70–99)

## 2013-04-18 LAB — BASIC METABOLIC PANEL
BUN: 15 mg/dL (ref 6–23)
Chloride: 105 mEq/L (ref 96–112)
Creatinine, Ser: 1.51 mg/dL — ABNORMAL HIGH (ref 0.50–1.35)
GFR calc Af Amer: 55 mL/min — ABNORMAL LOW (ref >90)
GFR calc non Af Amer: 47 mL/min — ABNORMAL LOW (ref >90)
Glucose, Bld: 187 mg/dL — ABNORMAL HIGH (ref 70–99)
Potassium: 3.7 mEq/L (ref 3.5–5.1)
Sodium: 141 mEq/L (ref 135–145)

## 2013-04-18 LAB — CBC
HCT: 22.2 % — ABNORMAL LOW (ref 39.0–52.0)
Hemoglobin: 7.3 g/dL — ABNORMAL LOW (ref 13.0–17.0)
MCH: 24.7 pg — ABNORMAL LOW (ref 26.0–34.0)
MCHC: 32.9 g/dL (ref 30.0–36.0)
RBC: 2.96 MIL/uL — ABNORMAL LOW (ref 4.22–5.81)

## 2013-04-18 MED ORDER — POLYETHYLENE GLYCOL 3350 17 G PO PACK: 17.0000 g | PACK | Freq: Every day | ORAL | Status: DC | PRN

## 2013-04-18 MED ORDER — AMLODIPINE BESYLATE 10 MG PO TABS: 10.0000 mg | ORAL_TABLET | Freq: Every day | ORAL | Status: DC

## 2013-04-18 MED ORDER — ONDANSETRON HCL 4 MG PO TABS: 4.0000 mg | ORAL_TABLET | ORAL | Status: DC | PRN

## 2013-04-18 MED ORDER — PANTOPRAZOLE SODIUM 40 MG PO TBEC: 40.0000 mg | DELAYED_RELEASE_TABLET | Freq: Every day | ORAL | Status: DC

## 2013-04-18 MED ORDER — HYDROCODONE-ACETAMINOPHEN 5-325 MG PO TABS: 2.0000 | ORAL_TABLET | ORAL | Status: DC | PRN

## 2013-04-18 MED ORDER — SITAGLIPTIN PHOSPHATE 25 MG PO TABS: 25.0000 mg | ORAL_TABLET | Freq: Every day | ORAL | Status: DC

## 2013-04-18 MED ORDER — DSS 100 MG PO CAPS: 100.0000 mg | ORAL_CAPSULE | Freq: Two times a day (BID) | ORAL | Status: DC | PRN

## 2013-04-18 MED ORDER — GLIPIZIDE ER 10 MG PO TB24: 10.0000 mg | ORAL_TABLET | Freq: Every day | ORAL | Status: DC

## 2013-04-18 MED ORDER — GABAPENTIN 100 MG PO CAPS: 100.0000 mg | ORAL_CAPSULE | Freq: Three times a day (TID) | ORAL | Status: DC

## 2013-04-18 MED ORDER — BISACODYL 10 MG RE SUPP: 10.0000 mg | Freq: Every day | RECTAL | Status: DC | PRN

## 2013-04-18 MED ORDER — DIPHENHYDRAMINE HCL 25 MG PO CAPS: 25.0000 mg | ORAL_CAPSULE | ORAL | Status: DC | PRN

## 2013-04-18 MED ORDER — LABETALOL HCL 100 MG PO TABS: 100.0000 mg | ORAL_TABLET | Freq: Two times a day (BID) | ORAL | Status: DC

## 2013-04-18 MED ORDER — ALPRAZOLAM 0.25 MG PO TABS: 0.2500 mg | ORAL_TABLET | Freq: Three times a day (TID) | ORAL | Status: DC

## 2013-04-18 MED ORDER — ACETAMINOPHEN 325 MG PO TABS: 650.0000 mg | ORAL_TABLET | Freq: Four times a day (QID) | ORAL | Status: DC | PRN

## 2013-04-18 MED ORDER — FOLIC ACID 1 MG PO TABS: 1.0000 mg | ORAL_TABLET | Freq: Every day | ORAL | Status: DC

## 2013-04-18 NOTE — Discharge Summary (Signed)
Physician Discharge Summary  Coletin Sigrist Valente [id number] DOB: 1951/03/02 DOA: 04/13/2013  PCP: Ricke Hey, MD  Admit date: 04/13/2013 Discharge date: 04/18/2013  Recommendations for Outpatient Follow-up:  1. Per physical therapy, recommendation is for 24-hour supervision with home health physical therapy or skilled nursing facility placement. Family opts for 24-hour supervision with home health physical therapy discharge plan today. 2. Please note that we have stopped few medications which are excreted through the kidneys and have possibly contributed to kidney failure. One of those was blood pressure medication Lotrel; patient can continue taking Norvasc 10 mg daily in addition to labetalol 100 mg twice a day for blood pressure control 3. Please take caution when giving Xanax, it is a low dose, 0.25 mg 3 times a day per his previous home medication regimen but it may contribute to patient's confusion. If mental status worsens then consider stopping this medication. 4. Continue taking folic acid  5. Hemoglobin is stable at the time of discharge at 7.3 to 7.6.  Patient did receive blood transfusions throughout the hospital stay. As mentioned, hemoglobin has been stable for past 24 hours. 6. White blood cell count is continuously elevated at 18 which is likely reactive. 7. During next appointment with primary care physician please have them recheck CBC to ensure the stability of the counts. 8. For diabetes management, please avoid metformin as it is excreted through the kidneys. Recommendation is to continue Glucotrol and low dose Januvia  Please note that all of this was communicated to Dr. Cassandria Anger, pt PCP over the phone and he reported to have patient's family call him to schedule an appointment   Discharge Diagnoses:  Principal Problem:   Vasoocclusive sickle cell crisis Active Problems:   Sickle cell anemia   Hypertension   Diabetes mellitus   Chest pain   Leukocytosis   CKD  (chronic kidney disease), stage III   Thrombocytopenia, unspecified    Discharge Condition: Medically stable for discharge today. Per family request, we will discharge the patient home with home health physical therapy, occupational therapy, nurse and home health aide  Diet recommendation: Diabetic diet, as tolerated  History of present illness:  62 -year-old male with past medical history significant for sickle cell disease and related anemia, drug abuse, diabetes, hypertension, chronic kidney disease stage III who presented to [facility] ED 04/13/2013 with reports of severe thigh pain and chest pain typical of his usual sickle cell crisis. Patient took hydrocodone at home with no significant symptomatic relief.  In ED, vitals were stable. White blood cell count was elevated at 24.5, hemoglobin 8.5 and platelets were within normal limits. Chest x-ray showed no active cardiopulmonary disease. The 12-lead EKG showed normal sinus rhythm. Cardiac enzymes so far are all negative for total of 3 sets. Over past 24 hours hemoglobin has dropped from 7.2 to 5.5. Patient has received a total of 2 units of PRBC, 11/11 and 11/12.   Assessment and Plan:   Principal Problem:  Vasoocclusive sickle cell crisis  - Chest pain resolved, cardiac enzymes are negative for total of 3 sets, the 12-lead EKG showed normal sinus rhythm on the admission  - Pain management: may continue norco PO PRN - please note that NSAIDs contraindicated secondary to chronic kidney disease.  - Adjuvant pain therapy: Gabapentin.  - Monitored CBC daily throughout the hospital stay due to drop in hemoglobin from 7.2 to 5.5. Pt received 1 unit PRBC 11/11 and 11/12. Hgb today 7.3 - Monitored bilirubin/LDH Q 72 hours to monitor hemolysis.  LDH 760, ferritin 1582 - all checked 04/15/2013. Started desferal in hospital. It is not needed at the time of discharge. - Reticulocyte count 297 on admission, no evidence of aplastic anemia.  - K pad PRN given in  hospital.  - Continue folic acid.  - UDS was pos for cocaine   Active Problems:  Acute blood loss anemia  - Likely related to sickle cell crisis  - Transfused 1 unit of PRBC 04/15/2013 and 04/16/2013  Hypertension  - Continue Norvasc 10 mg daily and low dose labetalol 100 mg PO BID Diabetes mellitus  - A1c is within normal limits on this admission  - CBGs in past 24 hours: 191, 155, 184 Leukocytosis  - Likely reactive. Chest x-ray on admission did not show acute cardiopulmonary disease  CKD (chronic kidney disease), stage III  - Creatinine was 1.8 on the admission and is trending down to 1.51 Thrombocytopenia, unspecified  - Likely secondary to Lovenox. Discontinued Lovenox and use SCD for DVT prophylaxis; Platelet now WNL    Code Status: Full  Family Communication: No family at the bedside    Consultants:  None  Procedures/Studies:  Dg Chest 2 View 04/13/2013 IMPRESSION: No active cardiopulmonary disease.  Dg Abd 1 View 04/14/2013 IMPRESSION: 1. No evidence of bowel distention. Stool is noted throughout the colon. 2. Sclerotic osseous changes consistent with patient's known sickle cell disease.  Antibiotics:  None    Signed:  Leisa Lenz, MD  [facility] 04/18/2013, 12:38 PM  Pager #: [phone]   Discharge Exam: Filed Vitals:   04/18/13 0625  BP: 159/79  Pulse: 101  Temp: 99.9 F (37.7 C)  Resp: 20   Filed Vitals:   04/17/13 1400 04/17/13 2130 04/18/13 0220 04/18/13 0625  BP: 160/90 174/77 176/90 159/79  Pulse: 100 96 96 101  Temp: 99.3 F (37.4 C) 99.2 F (37.3 C) 99.5 F (37.5 C) 99.9 F (37.7 C)  TempSrc: Oral Oral Oral Oral  Resp: 18 20 20 20   Height:      Weight:    85.7 kg (188 lb 15 oz)  SpO2: 99% 100% 100% 98%    General: Pt is alert, follows commands appropriately, not in acute distress Cardiovascular: Regular rate and rhythm, S1/S2 appreciated  Respiratory: Clear to auscultation bilaterally, no wheezing, no crackles, no  rhonchi Abdominal: Soft, non tender, non distended, bowel sounds +, no guarding Extremities: no edema, no cyanosis, pulses palpable bilaterally DP and PT Neuro: Grossly nonfocal  Discharge Instructions  Discharge Orders   Future Orders Complete By Expires   Call MD for:  difficulty breathing, headache or visual disturbances  As directed    Call MD for:  persistant dizziness or light-headedness  As directed    Call MD for:  persistant nausea and vomiting  As directed    Call MD for:  severe uncontrolled pain  As directed    Diet - low sodium heart healthy  As directed    Discharge instructions  As directed    Comments:     1. Per physical therapy, recommendation is for 24-hour supervision with home health physical therapy or skilled nursing facility placement. Family opts for 24-hour supervision with home health physical therapy discharge plan today. 2. Please note that we have stopped few medications which are excreted through the kidneys and have possibly contributed to kidney failure. One of those was blood pressure medication Lotrel; patient can continue taking Norvasc 10 mg daily in addition to labetalol 100 mg twice a day for blood  pressure control 3. Please take caution when giving Xanax, it is a low dose, 0.25 mg 3 times a day per his previous home medication regimen but it may contribute to patient's confusion. If mental status worsens then consider stopping this medication. 4. Continue taking folic acid  5. Hemoglobin is stable at the time of discharge at 7.3 to 7.6.  Patient did receive blood transfusions throughout the hospital stay. As mentioned, hemoglobin has been stable for past 24 hours. 6. White blood cell count is continuously elevated at 18 which is likely reactive. 7. During next appointment with primary care physician please have them recheck CBC to ensure the stability of the counts. 8. For diabetes management, please avoid metformin as it is excreted through the kidneys.  Recommendation is to continue Glucotrol and low dose Januvia   Increase activity slowly  As directed        Medication List    STOP taking these medications       amLODipine-benazepril 10-20 MG per capsule  Commonly known as:  LOTREL     metFORMIN 1000 MG tablet  Commonly known as:  GLUCOPHAGE     naproxen sodium 220 MG tablet  Commonly known as:  ANAPROX     omeprazole 20 MG capsule  Commonly known as:  PRILOSEC  Replaced by:  pantoprazole 40 MG tablet      TAKE these medications       acetaminophen 325 MG tablet  Commonly known as:  TYLENOL  Take 2 tablets (650 mg total) by mouth every 6 (six) hours as needed for mild pain or fever.     ALPRAZolam 0.25 MG tablet  Commonly known as:  XANAX  Take 1-2 tablets (0.25-0.5 mg total) by mouth 3 (three) times daily.     amLODipine 10 MG tablet  Commonly known as:  NORVASC  Take 1 tablet (10 mg total) by mouth daily.     bisacodyl 10 MG suppository  Commonly known as:  DULCOLAX  Place 1 suppository (10 mg total) rectally daily as needed for moderate constipation.     diphenhydrAMINE 25 mg capsule  Commonly known as:  BENADRYL  Take 1-2 capsules (25-50 mg total) by mouth every 4 (four) hours as needed for itching.     DSS 100 MG Caps  Take 100 mg by mouth 2 (two) times daily as needed for mild constipation.     folic acid 1 MG tablet  Commonly known as:  FOLVITE  Take 1 tablet (1 mg total) by mouth daily.     gabapentin 100 MG capsule  Commonly known as:  NEURONTIN  Take 1 capsule (100 mg total) by mouth 3 (three) times daily.     glipiZIDE 10 MG 24 hr tablet  Commonly known as:  GLUCOTROL XL  Take 1 tablet (10 mg total) by mouth daily.     HYDROcodone-acetaminophen 5-325 MG per tablet  Commonly known as:  NORCO/VICODIN  Take 2 tablets by mouth every 4 (four) hours as needed for moderate pain.     labetalol 100 MG tablet  Commonly known as:  NORMODYNE  Take 1 tablet (100 mg total) by mouth 2 (two) times daily.      ondansetron 4 MG tablet  Commonly known as:  ZOFRAN  Take 1 tablet (4 mg total) by mouth every 4 (four) hours as needed for nausea.     pantoprazole 40 MG tablet  Commonly known as:  PROTONIX  Take 1 tablet (40 mg total) by mouth daily.  polyethylene glycol packet  Commonly known as:  MIRALAX / GLYCOLAX  Take 17 g by mouth daily as needed for mild constipation.     sitaGLIPtin 25 MG tablet  Commonly known as:  JANUVIA  Take 1 tablet (25 mg total) by mouth daily.           Follow-up Information   Schedule an appointment as soon as possible for a visit with Ricke Hey, MD. (Dr. Alyson Ingles said to call his affice to sch appt)    Specialty:  Family Medicine   Contact information:   [place] [address] [phone]        The results of significant diagnostics from this hospitalization (including imaging, microbiology, ancillary and laboratory) are listed below for reference.    Significant Diagnostic Studies: Dg Chest 2 View  04/13/2013   CLINICAL DATA:  Sickle cell pain crisis, chest and body pain.  EXAM: CHEST  2 VIEW  COMPARISON:  04/12/2013  FINDINGS: The heart size and mediastinal contours are within normal limits. Both lungs are clear. There is patchy sclerosis and lucency in the humeral heads bilaterally as can be seen with avascular necrosis.  IMPRESSION: No active cardiopulmonary disease.   Electronically Signed   By: Kathreen Devoid   On: 04/13/2013 13:59   Dg Abd 1 View  04/14/2013   CLINICAL DATA:  Abdominal distention.  EXAM: ABDOMEN - 1 VIEW  COMPARISON:  04/10/2012.  07/28/2011.  FINDINGS: Stool is noted throughout the colon. Lucencies projected over the right colon most likely represent a large amount of stool as opposed to pneumatosis. No prominent bowel distention is noted. No free air is noted. Surgical clips right upper quadrant. The bones are diffusely sclerotic. This is consistent with the patient's known sickle cell  disease. Other etiologies of diffuse osteosclerosis including metastatic disease cannot be excluded. These findings are stable from prior exams of 04/10/2012 and 07/28/2011 and again most consistent the patient's known sickle cell disease.  IMPRESSION: 1. No evidence of bowel distention. Stool is noted throughout the colon. 2. Sclerotic osseous changes consistent with patient's known sickle cell disease.   Electronically Signed   By: Marcello Moores  Register   On: 04/14/2013 07:57   Dg Chest Portable 1 View  04/12/2013   CLINICAL DATA:  Mid chest pain for 2 hr, shortness of breath  EXAM: PORTABLE CHEST - 1 VIEW  COMPARISON:  04/12/2012.  FINDINGS: The heart size and mediastinal contours are within normal limits. Both lungs are clear. The visualized skeletal structures are unremarkable.  IMPRESSION: No active disease.   Electronically Signed   By: Kathreen Devoid   On: 04/12/2013 09:10    Microbiology: No results found for this or any previous visit (from the past 240 hour(s)).   Labs: Basic Metabolic Panel:  Recent Labs Lab 04/14/13 0100 04/14/13 0650 04/15/13 0340 04/16/13 1005 04/18/13 0447  NA 132* 133* 136 139 141  K 4.2 4.5 4.7 4.1 3.7  CL 98 102 105 110 105  CO2 24 20 20 23 26   GLUCOSE 168* 162* 202* 205* 187*  BUN 22 19 23 18 15   CREATININE 1.58* 1.42* 1.61* 1.56* 1.51*  CALCIUM 10.1 9.3 8.8 8.9 9.8  MG  --  2.1  --   --   --    Liver Function Tests:  Recent Labs Lab 04/13/13 1415 04/14/13 0100 04/14/13 0650  AST 58* 55* 49*  ALT 20 22 18   ALKPHOS 93 112 95  BILITOT 3.2* 3.3* 1.9*  PROT 7.9 8.4* 6.8  ALBUMIN 4.2 4.4 3.4*   No results found for this basename: LIPASE, AMYLASE,  in the last 168 hours No results found for this basename: AMMONIA,  in the last 168 hours CBC:  Recent Labs Lab 04/12/13 0841  04/13/13 1415  04/14/13 0650 04/15/13 0340 04/16/13 1005 04/17/13 0345 04/18/13 0447  WBC 24.5*  --  22.5*  < > 22.4* 23.2* 18.4* 18.2* 18.4*  NEUTROABS 19.2*  --   19.3*  --  17.4*  --   --   --   --   HGB 8.5*  < > 8.0*  < > 7.2* 5.5* 6.0* 7.6* 7.3*  HCT 25.2*  < > 23.7*  < > 21.6* 16.2* 17.4* 22.4* 22.2*  MCV 70.0*  --  68.7*  < > 68.8* 68.4* 70.7* 73.9* 75.0*  PLT 298  --  219  < > 172 114* 159 193 181  < > = values in this interval not displayed. Cardiac Enzymes:  Recent Labs Lab 04/13/13 1415 04/14/13 0100 04/14/13 0650  TROPONINI <0.30 <0.30 <0.30   BNP: BNP (last 3 results) No results found for this basename: PROBNP,  in the last 8760 hours CBG:  Recent Labs Lab 04/17/13 1217 04/17/13 1701 04/17/13 2216 04/18/13 0812 04/18/13 1151  GLUCAP 211* 177* 191* 155* 184*    Time coordinating discharge: Over 30 minutes

## 2013-04-18 NOTE — Progress Notes (Signed)
Clinical Social Work Department BRIEF PSYCHOSOCIAL ASSESSMENT 04/18/2013  Patient:  [name], [name]     Account Number:  [id number]     Admit date:  04/13/2013  Clinical Social Worker:  Ulyess Blossom  Date/Time:  04/18/2013 12:00 N  Referred by:  Physician  Date Referred:  04/18/2013 Referred for  SNF Placement   Other Referral:   Interview type:  Patient Other interview type:   and patient ex spouse at bedside    PSYCHOSOCIAL DATA Living Status:  ALONE Admitted from facility:   Level of care:   Primary support name:  Candice Camp Primary support relationship to patient:  SPOUSE Degree of support available:   adequate    CURRENT CONCERNS Current Concerns  Post-Acute Placement   Other Concerns:    SOCIAL WORK ASSESSMENT / PLAN CSW reviewed chart and noted that PT recommending SNF vs HH PT with 24 hour supervision.    CSW met with pt and pt ex spouse at bedside. CSW discussed recommendation from PT. Pt ex spouse discussed that pt has had intermittent confusion and is unsure if he is able to make decisions adequately. CSW discussed with pt who did display difficulty communicating disposition, but clearly stated that he did not want to go to SNF. CSW explored that support pt would have at home and if pt family able to provide 24 hour care. Pt ex spouse stated that she is agreeable to assisting with care and pt adult daughter would also be agreeable to assisting with pt care.    Pt and pt family plan for pt to return home with [facility] services and 24 hour care.    CSW notified MD and RNCM.    No further social work needs identified at this time.    CSW signing off.   Assessment/plan status:  Psychosocial Support/Ongoing Assessment of Needs Other assessment/ plan:   discharge planning   Information/referral to community resources:   Referral to [facility] for [facility] needs    PATIENT'S/FAMILY'S RESPONSE TO PLAN OF CARE: Pt alert and oriented x 3, but has difficulty  communicating and slow to respond. Pt family appears supportive and plans to have a schedule in order to ensure that pt has 24 hour care at home. Pt declined SNF at this time.    Drake Leach, MSW, [place] [facility] [phone]

## 2013-04-18 NOTE — Care Management Note (Signed)
Cm spoke with patient with ex spouse and adult daughter present at the bedside. Per pt and family pt dispostion is home with [facility] services. Per pt choice [facility] to provide [facility] services. [facility] rep Lurlean Leyden made aware. Per spouse pt has dme for home use. Pt's ex spouse to provide home care with adult daughter. No other barriers identified.    Venita Lick Norbert Malkin,RN,MSN [phone]

## 2013-04-18 NOTE — Evaluation (Signed)
Physical Therapy Evaluation Patient Details Name: [name] MRN: [id number] DOB: 04-Feb-1951 Today's Date: 04/18/2013 Time: [id number] PT Time Calculation (min): 23 min  PT Assessment / Plan / Recommendation History of Present Illness  62 yo male admitted with SCC. Hx of DM, drug abuse  Clinical Impression  On eval, pt required Min guard assist for mobility-able to ambulate ~100 fee with walker. Pt was very drowsy/somewhat lethargic throughout session. Unsure if pt will be safe to d/c home alone. Recommend SNF vs HHPT with 24 hour supervision, depending on progress.     PT Assessment  Patient needs continued PT services    Follow Up Recommendations  SNF vs Home health PT;Supervision/Assistance - 24 hour (depending on progress)    Does the patient have the potential to tolerate intense rehabilitation      Barriers to Discharge        Equipment Recommendations  None recommended by PT    Recommendations for Other Services OT consult   Frequency Min 3X/week    Precautions / Restrictions Precautions Precautions: Fall Restrictions Weight Bearing Restrictions: No   Pertinent Vitals/Pain bil LEs-unrated      Mobility  Bed Mobility Bed Mobility: Supine to Sit Supine to Sit: 6: Modified independent (Device/Increase time) Transfers Transfers: Sit to Stand;Stand to Sit Sit to Stand: 5: Supervision;From bed Stand to Sit: 5: Supervision;To chair/3-in-1 Details for Transfer Assistance: VCs safety Ambulation/Gait Ambulation/Gait Assistance: 4: Min guard Ambulation Distance (Feet): 100 Feet Assistive device: Rolling walker Ambulation/Gait Assistance Details: VCs safety, step length. slow gait speed. no LOB Gait Pattern: Step-through pattern;Decreased step length - right;Decreased step length - left;Trunk flexed    Exercises     PT Diagnosis: Difficulty walking;Abnormality of gait;Generalized weakness;Acute pain  PT Problem List: Decreased strength;Decreased activity  tolerance;Decreased mobility;Decreased knowledge of use of DME;Pain PT Treatment Interventions: DME instruction;Gait training;Functional mobility training;Therapeutic activities;Therapeutic exercise;Patient/family education     PT Goals(Current goals can be found in the care plan section) Acute Rehab PT Goals Patient Stated Goal: home.  PT Goal Formulation: With patient Time For Goal Achievement: 05/02/13 Potential to Achieve Goals: Good  Visit Information  Last PT Received On: 04/18/13 Assistance Needed: +1 History of Present Illness: 62 yo male admitted with SCC. Hx of DM, drug abuse       Prior Functioning  Home Living Family/patient expects to be discharged to:: Private residence Living Arrangements: Alone Available Help at Discharge: Available PRN/intermittently (ex wife) Type of Home: House Home Access: Stairs to enter [employer] of Steps: 1 step Home Layout: One level [place]: Environmental consultant - 2 wheels Prior Function Level of Independence: Independent with assistive device(s) Communication Communication: No difficulties    Cognition  Cognition Arousal/Alertness: Lethargic Behavior During Therapy: Flat affect Overall Cognitive Status: Within Functional Limits for tasks assessed    Extremity/Trunk Assessment Upper Extremity Assessment Upper Extremity Assessment: Overall WFL for tasks assessed Lower Extremity Assessment Lower Extremity Assessment: Generalized weakness Cervical / Trunk Assessment Cervical / Trunk Assessment: Normal   Balance    End of Session PT - End of Session Equipment Utilized During Treatment: Gait belt Activity Tolerance: Patient tolerated treatment well Patient left: in chair;with call bell/phone within reach;with family/visitor present Nurse Communication: Mobility status  GP     [name], MPT Pager: [phone]

## 2014-02-18 ENCOUNTER — Other Ambulatory Visit (HOSPITAL_COMMUNITY): Payer: Self-pay | Admitting: Internal Medicine

## 2014-02-18 ENCOUNTER — Ambulatory Visit (HOSPITAL_COMMUNITY): Admission: RE | Admit: 2014-02-18 | Payer: Medicare Other | Source: Ambulatory Visit

## 2014-02-18 DIAGNOSIS — M79605 Pain in left leg: Secondary | ICD-10-CM

## 2014-04-07 ENCOUNTER — Ambulatory Visit (HOSPITAL_COMMUNITY)
Admission: RE | Admit: 2014-04-07 | Discharge: 2014-04-07 | Disposition: A | Discharge status: Home / Self Care | Payer: Medicare Other | Source: Ambulatory Visit | Attending: Vascular Surgery | Admitting: Vascular Surgery

## 2014-04-07 ENCOUNTER — Other Ambulatory Visit (HOSPITAL_COMMUNITY): Payer: Self-pay | Admitting: Internal Medicine

## 2014-04-07 DIAGNOSIS — M7989 Other specified soft tissue disorders: Secondary | ICD-10-CM

## 2014-04-07 DIAGNOSIS — M79605 Pain in left leg: Secondary | ICD-10-CM | POA: Diagnosis present

## 2014-04-07 DIAGNOSIS — M79609 Pain in unspecified limb: Secondary | ICD-10-CM

## 2014-04-07 NOTE — Progress Notes (Signed)
VASCULAR LAB PRELIMINARY  ARTERIAL  ABI completed:    RIGHT    LEFT    PRESSURE WAVEFORM  PRESSURE WAVEFORM  BRACHIAL 160 Triphasic BRACHIAL 160 Triphasic  DP 196 Triphasic DP 198 Triphasic  PT 203 Triphasic PT 192 Triphasic    RIGHT LEFT  ABI 1.27 1.24   ABIs and Doppler waveforms are within normal limits bilaterally at rest.  Duplex scan of the left lower extremity revealed no evidence of significant plaque noted or stenosis. All Doppler waveforms are within normal limits   Zadok Holaway, RVS 04/07/2014, 3:32 PM

## 2014-09-03 ENCOUNTER — Inpatient Hospital Stay (HOSPITAL_COMMUNITY)
Admission: EM | Admit: 2014-09-03 | Discharge: 2014-09-09 | DRG: 682 | Disposition: A | Discharge status: Home / Self Care | Payer: Medicare Other | Attending: Internal Medicine | Admitting: Internal Medicine

## 2014-09-03 ENCOUNTER — Inpatient Hospital Stay (HOSPITAL_COMMUNITY): Payer: Medicare Other

## 2014-09-03 ENCOUNTER — Encounter (HOSPITAL_COMMUNITY): Payer: Self-pay | Admitting: *Deleted

## 2014-09-03 DIAGNOSIS — I129 Hypertensive chronic kidney disease with stage 1 through stage 4 chronic kidney disease, or unspecified chronic kidney disease: Secondary | ICD-10-CM | POA: Diagnosis not present

## 2014-09-03 DIAGNOSIS — F1590 Other stimulant use, unspecified, uncomplicated: Secondary | ICD-10-CM | POA: Diagnosis not present

## 2014-09-03 DIAGNOSIS — N183 Chronic kidney disease, stage 3 unspecified: Secondary | ICD-10-CM | POA: Diagnosis present

## 2014-09-03 DIAGNOSIS — I10 Essential (primary) hypertension: Secondary | ICD-10-CM

## 2014-09-03 DIAGNOSIS — F1010 Alcohol abuse, uncomplicated: Secondary | ICD-10-CM | POA: Diagnosis present

## 2014-09-03 DIAGNOSIS — I447 Left bundle-branch block, unspecified: Secondary | ICD-10-CM | POA: Diagnosis not present

## 2014-09-03 DIAGNOSIS — D72829 Elevated white blood cell count, unspecified: Secondary | ICD-10-CM | POA: Diagnosis not present

## 2014-09-03 DIAGNOSIS — E118 Type 2 diabetes mellitus with unspecified complications: Secondary | ICD-10-CM

## 2014-09-03 DIAGNOSIS — R509 Fever, unspecified: Secondary | ICD-10-CM | POA: Diagnosis not present

## 2014-09-03 DIAGNOSIS — F1420 Cocaine dependence, uncomplicated: Secondary | ICD-10-CM | POA: Diagnosis present

## 2014-09-03 DIAGNOSIS — E872 Acidosis: Secondary | ICD-10-CM | POA: Diagnosis present

## 2014-09-03 DIAGNOSIS — G9340 Encephalopathy, unspecified: Secondary | ICD-10-CM | POA: Diagnosis not present

## 2014-09-03 DIAGNOSIS — Z79899 Other long term (current) drug therapy: Secondary | ICD-10-CM | POA: Diagnosis not present

## 2014-09-03 DIAGNOSIS — E1129 Type 2 diabetes mellitus with other diabetic kidney complication: Secondary | ICD-10-CM

## 2014-09-03 DIAGNOSIS — E869 Volume depletion, unspecified: Secondary | ICD-10-CM | POA: Diagnosis not present

## 2014-09-03 DIAGNOSIS — D571 Sickle-cell disease without crisis: Secondary | ICD-10-CM | POA: Diagnosis not present

## 2014-09-03 DIAGNOSIS — F1410 Cocaine abuse, uncomplicated: Secondary | ICD-10-CM | POA: Diagnosis present

## 2014-09-03 DIAGNOSIS — G92 Toxic encephalopathy: Secondary | ICD-10-CM | POA: Diagnosis not present

## 2014-09-03 DIAGNOSIS — E875 Hyperkalemia: Secondary | ICD-10-CM | POA: Diagnosis present

## 2014-09-03 DIAGNOSIS — F17290 Nicotine dependence, other tobacco product, uncomplicated: Secondary | ICD-10-CM | POA: Diagnosis present

## 2014-09-03 DIAGNOSIS — Z833 Family history of diabetes mellitus: Secondary | ICD-10-CM | POA: Diagnosis not present

## 2014-09-03 DIAGNOSIS — Z85038 Personal history of other malignant neoplasm of large intestine: Secondary | ICD-10-CM

## 2014-09-03 DIAGNOSIS — Z791 Long term (current) use of non-steroidal anti-inflammatories (NSAID): Secondary | ICD-10-CM

## 2014-09-03 DIAGNOSIS — N179 Acute kidney failure, unspecified: Principal | ICD-10-CM | POA: Diagnosis present

## 2014-09-03 DIAGNOSIS — R200 Anesthesia of skin: Secondary | ICD-10-CM | POA: Diagnosis present

## 2014-09-03 DIAGNOSIS — E11649 Type 2 diabetes mellitus with hypoglycemia without coma: Secondary | ICD-10-CM | POA: Diagnosis not present

## 2014-09-03 HISTORY — DX: Disorder of kidney and ureter, unspecified: N28.9

## 2014-09-03 LAB — CBC WITH DIFFERENTIAL/PLATELET
BASOS ABS: 0.1 10*3/uL (ref 0.0–0.1)
BASOS PCT: 1 % (ref 0–1)
EOS ABS: 1 10*3/uL — AB (ref 0.0–0.7)
Eosinophils Relative: 8 % — ABNORMAL HIGH (ref 0–5)
HCT: 21.1 % — ABNORMAL LOW (ref 39.0–52.0)
HEMOGLOBIN: 6.9 g/dL — AB (ref 13.0–17.0)
LYMPHS PCT: 29 % (ref 12–46)
Lymphs Abs: 3.6 10*3/uL (ref 0.7–4.0)
MCH: 23.2 pg — AB (ref 26.0–34.0)
MCHC: 32.7 g/dL (ref 30.0–36.0)
MCV: 71 fL — ABNORMAL LOW (ref 78.0–100.0)
MONO ABS: 1.5 10*3/uL — AB (ref 0.1–1.0)
Monocytes Relative: 12 % (ref 3–12)
NEUTROS ABS: 6.2 10*3/uL (ref 1.7–7.7)
NEUTROS PCT: 50 % (ref 43–77)
Platelets: 340 10*3/uL (ref 150–400)
RBC: 2.97 MIL/uL — ABNORMAL LOW (ref 4.22–5.81)
RDW: 18.1 % — AB (ref 11.5–15.5)
WBC: 12.4 10*3/uL — ABNORMAL HIGH (ref 4.0–10.5)

## 2014-09-03 LAB — URINALYSIS, ROUTINE W REFLEX MICROSCOPIC
BILIRUBIN URINE: NEGATIVE
Bilirubin Urine: NEGATIVE
GLUCOSE, UA: NEGATIVE mg/dL
GLUCOSE, UA: 250 mg/dL — AB
HGB URINE DIPSTICK: NEGATIVE
Hgb urine dipstick: NEGATIVE
KETONES UR: NEGATIVE mg/dL
Ketones, ur: NEGATIVE mg/dL
LEUKOCYTES UA: NEGATIVE
Leukocytes, UA: NEGATIVE
NITRITE: NEGATIVE
Nitrite: NEGATIVE
PROTEIN: NEGATIVE mg/dL
Protein, ur: NEGATIVE mg/dL
SPECIFIC GRAVITY, URINE: 1.007 (ref 1.005–1.030)
Specific Gravity, Urine: 1.009 (ref 1.005–1.030)
Urobilinogen, UA: 0.2 mg/dL (ref 0.0–1.0)
Urobilinogen, UA: 0.2 mg/dL (ref 0.0–1.0)
pH: 5 (ref 5.0–8.0)
pH: 5 (ref 5.0–8.0)

## 2014-09-03 LAB — RENAL FUNCTION PANEL
Albumin: 3.6 g/dL (ref 3.5–5.2)
Anion gap: 11 (ref 5–15)
BUN: 36 mg/dL — AB (ref 6–23)
CHLORIDE: 113 mmol/L — AB (ref 96–112)
CO2: 13 mmol/L — AB (ref 19–32)
Calcium: 8.8 mg/dL (ref 8.4–10.5)
Creatinine, Ser: 3.29 mg/dL — ABNORMAL HIGH (ref 0.50–1.35)
GFR calc Af Amer: 21 mL/min — ABNORMAL LOW (ref >90)
GFR calc non Af Amer: 18 mL/min — ABNORMAL LOW (ref >90)
GLUCOSE: 224 mg/dL — AB (ref 70–99)
POTASSIUM: 6.1 mmol/L — AB (ref 3.5–5.1)
Phosphorus: 4.3 mg/dL (ref 2.3–4.6)
Sodium: 137 mmol/L (ref 135–145)

## 2014-09-03 LAB — BASIC METABOLIC PANEL
Anion gap: 7 (ref 5–15)
Anion gap: 11 (ref 5–15)
BUN: 44 mg/dL — ABNORMAL HIGH (ref 6–23)
BUN: 38 mg/dL — ABNORMAL HIGH (ref 6–23)
CALCIUM: 9.2 mg/dL (ref 8.4–10.5)
CALCIUM: 9.5 mg/dL (ref 8.4–10.5)
CHLORIDE: 109 mmol/L (ref 96–112)
CO2: 18 mmol/L — AB (ref 19–32)
CO2: 17 mmol/L — AB (ref 19–32)
CREATININE: 3.61 mg/dL — AB (ref 0.50–1.35)
Chloride: 112 mmol/L (ref 96–112)
Creatinine, Ser: 3.3 mg/dL — ABNORMAL HIGH (ref 0.50–1.35)
GFR calc Af Amer: 19 mL/min — ABNORMAL LOW (ref >90)
GFR calc Af Amer: 21 mL/min — ABNORMAL LOW (ref >90)
GFR calc non Af Amer: 18 mL/min — ABNORMAL LOW (ref >90)
GFR, EST NON AFRICAN AMERICAN: 16 mL/min — AB (ref >90)
Glucose, Bld: 150 mg/dL — ABNORMAL HIGH (ref 70–99)
Glucose, Bld: 168 mg/dL — ABNORMAL HIGH (ref 70–99)
Potassium: 6.5 mmol/L (ref 3.5–5.1)
Potassium: 6 mmol/L — ABNORMAL HIGH (ref 3.5–5.1)
SODIUM: 140 mmol/L (ref 135–145)
Sodium: 134 mmol/L — ABNORMAL LOW (ref 135–145)

## 2014-09-03 LAB — CBG MONITORING, ED
GLUCOSE-CAPILLARY: 53 mg/dL — AB (ref 70–99)
GLUCOSE-CAPILLARY: 69 mg/dL — AB (ref 70–99)
Glucose-Capillary: 118 mg/dL — ABNORMAL HIGH (ref 70–99)
Glucose-Capillary: 149 mg/dL — ABNORMAL HIGH (ref 70–99)
Glucose-Capillary: 171 mg/dL — ABNORMAL HIGH (ref 70–99)
Glucose-Capillary: 149 mg/dL — ABNORMAL HIGH (ref 70–99)

## 2014-09-03 LAB — COMPREHENSIVE METABOLIC PANEL
ALBUMIN: 4.8 g/dL (ref 3.5–5.2)
ALK PHOS: 59 U/L (ref 39–117)
ALT: 15 U/L (ref 0–53)
ANION GAP: 12 (ref 5–15)
AST: 31 U/L (ref 0–37)
BUN: 43 mg/dL — ABNORMAL HIGH (ref 6–23)
CO2: 16 mmol/L — ABNORMAL LOW (ref 19–32)
Calcium: 9.9 mg/dL (ref 8.4–10.5)
Chloride: 107 mmol/L (ref 96–112)
Creatinine, Ser: 3.7 mg/dL — ABNORMAL HIGH (ref 0.50–1.35)
GFR calc Af Amer: 18 mL/min — ABNORMAL LOW (ref >90)
GFR, EST NON AFRICAN AMERICAN: 16 mL/min — AB (ref >90)
GLUCOSE: 51 mg/dL — AB (ref 70–99)
POTASSIUM: 5.8 mmol/L — AB (ref 3.5–5.1)
SODIUM: 135 mmol/L (ref 135–145)
Total Bilirubin: 1.5 mg/dL — ABNORMAL HIGH (ref 0.3–1.2)
Total Protein: 8.4 g/dL — ABNORMAL HIGH (ref 6.0–8.3)

## 2014-09-03 LAB — RETICULOCYTES
RBC.: 2.95 MIL/uL — ABNORMAL LOW (ref 4.22–5.81)
RBC.: 2.94 MIL/uL — ABNORMAL LOW (ref 4.22–5.81)
RETIC COUNT ABSOLUTE: 206.5 10*3/uL — AB (ref 19.0–186.0)
RETIC CT PCT: 7.5 % — AB (ref 0.4–3.1)
Retic Count, Absolute: 220.5 10*3/uL — ABNORMAL HIGH (ref 19.0–186.0)
Retic Ct Pct: 7 % — ABNORMAL HIGH (ref 0.4–3.1)

## 2014-09-03 LAB — IRON AND TIBC
IRON: 80 ug/dL (ref 42–165)
Saturation Ratios: 30 % (ref 20–55)
TIBC: 267 ug/dL (ref 215–435)
UIBC: 187 ug/dL (ref 125–400)

## 2014-09-03 LAB — PHOSPHORUS: Phosphorus: 4.9 mg/dL — ABNORMAL HIGH (ref 2.3–4.6)

## 2014-09-03 LAB — LACTATE DEHYDROGENASE
LDH: 405 U/L — ABNORMAL HIGH (ref 94–250)
LDH: 265 U/L — AB (ref 94–250)

## 2014-09-03 LAB — RAPID URINE DRUG SCREEN, HOSP PERFORMED
Amphetamines: NOT DETECTED
BENZODIAZEPINES: NOT DETECTED
Barbiturates: NOT DETECTED
COCAINE: POSITIVE — AB
Opiates: NOT DETECTED
Tetrahydrocannabinol: NOT DETECTED

## 2014-09-03 LAB — CK TOTAL AND CKMB (NOT AT ARMC)
CK, MB: 6.7 ng/mL (ref 0.3–4.0)
Relative Index: 2.8 — ABNORMAL HIGH (ref 0.0–2.5)
Total CK: 238 U/L — ABNORMAL HIGH (ref 7–232)

## 2014-09-03 LAB — MAGNESIUM: MAGNESIUM: 2.2 mg/dL (ref 1.5–2.5)

## 2014-09-03 LAB — CBC
HCT: 22.7 % — ABNORMAL LOW (ref 39.0–52.0)
Hemoglobin: 7.3 g/dL — ABNORMAL LOW (ref 13.0–17.0)
MCH: 23.2 pg — AB (ref 26.0–34.0)
MCHC: 32.2 g/dL (ref 30.0–36.0)
MCV: 72.3 fL — AB (ref 78.0–100.0)
PLATELETS: 308 10*3/uL (ref 150–400)
RBC: 3.14 MIL/uL — AB (ref 4.22–5.81)
RDW: 18.7 % — ABNORMAL HIGH (ref 11.5–15.5)
WBC: 12.3 10*3/uL — ABNORMAL HIGH (ref 4.0–10.5)

## 2014-09-03 LAB — GLUCOSE, CAPILLARY
Glucose-Capillary: 226 mg/dL — ABNORMAL HIGH (ref 70–99)
Glucose-Capillary: 223 mg/dL — ABNORMAL HIGH (ref 70–99)
Glucose-Capillary: 218 mg/dL — ABNORMAL HIGH (ref 70–99)

## 2014-09-03 LAB — TYPE AND SCREEN
ABO/RH(D): AB POS
Antibody Screen: NEGATIVE

## 2014-09-03 LAB — MRSA PCR SCREENING: MRSA by PCR: NEGATIVE

## 2014-09-03 LAB — TSH: TSH: 4.095 u[IU]/mL (ref 0.350–4.500)

## 2014-09-03 LAB — PROTIME-INR
INR: 1.13 (ref 0.00–1.49)
PROTHROMBIN TIME: 14.6 s (ref 11.6–15.2)

## 2014-09-03 LAB — LACTIC ACID, PLASMA
LACTIC ACID, VENOUS: 3.5 mmol/L — AB (ref 0.5–2.0)
Lactic Acid, Venous: 3.4 mmol/L (ref 0.5–2.0)

## 2014-09-03 LAB — ETHANOL: Alcohol, Ethyl (B): <5 mg/dL (ref 0–9)

## 2014-09-03 LAB — TROPONIN I: Troponin I: <0.03 ng/mL (ref <0.031)

## 2014-09-03 LAB — VITAMIN B12: VITAMIN B 12: 222 pg/mL (ref 211–911)

## 2014-09-03 MED ORDER — SODIUM CHLORIDE 0.9 % IV BOLUS (SEPSIS)
1000.0000 mL | Freq: Once | INTRAVENOUS | Status: AC
  Administered 2014-09-03: 1000 mL via INTRAVENOUS

## 2014-09-03 MED ORDER — LORAZEPAM 1 MG PO TABS
1.0000 mg | ORAL_TABLET | Freq: Once | ORAL | Status: AC
  Administered 2014-09-03: 1 mg via ORAL
  Filled 2014-09-03: qty 1

## 2014-09-03 MED ORDER — ONDANSETRON HCL 4 MG PO TABS: 4.0000 mg | ORAL_TABLET | Freq: Four times a day (QID) | ORAL | Status: DC | PRN

## 2014-09-03 MED ORDER — ONDANSETRON HCL 4 MG/2ML IJ SOLN: 4.0000 mg | Freq: Four times a day (QID) | INTRAMUSCULAR | Status: DC | PRN

## 2014-09-03 MED ORDER — CALCIUM GLUCONATE 10 % IV SOLN
1.0000 g | Freq: Once | INTRAVENOUS | Status: AC
  Administered 2014-09-03: 1 g via INTRAVENOUS
  Filled 2014-09-03: qty 10

## 2014-09-03 MED ORDER — PANTOPRAZOLE SODIUM 40 MG PO TBEC
40.0000 mg | DELAYED_RELEASE_TABLET | Freq: Every day | ORAL | Status: DC
  Administered 2014-09-03 – 2014-09-09 (×7): 40 mg via ORAL
  Filled 2014-09-03 (×5): qty 1

## 2014-09-03 MED ORDER — ACETAMINOPHEN 325 MG PO TABS
650.0000 mg | ORAL_TABLET | Freq: Four times a day (QID) | ORAL | Status: DC | PRN
  Administered 2014-09-03: 650 mg via ORAL
  Filled 2014-09-03: qty 2

## 2014-09-03 MED ORDER — SODIUM CHLORIDE 0.9 % IV SOLN
INTRAVENOUS | Status: DC
  Administered 2014-09-03: 07:00:00 via INTRAVENOUS

## 2014-09-03 MED ORDER — VITAMIN B-1 100 MG PO TABS
100.0000 mg | ORAL_TABLET | Freq: Every day | ORAL | Status: DC
  Administered 2014-09-03 – 2014-09-09 (×7): 100 mg via ORAL
  Filled 2014-09-03 (×7): qty 1

## 2014-09-03 MED ORDER — ALBUTEROL SULFATE (2.5 MG/3ML) 0.083% IN NEBU
10.0000 mg | INHALATION_SOLUTION | Freq: Once | RESPIRATORY_TRACT | Status: DC
  Filled 2014-09-03: qty 12

## 2014-09-03 MED ORDER — AMLODIPINE BESYLATE 10 MG PO TABS
10.0000 mg | ORAL_TABLET | Freq: Every day | ORAL | Status: DC
  Administered 2014-09-03 – 2014-09-09 (×7): 10 mg via ORAL
  Filled 2014-09-03 (×3): qty 1
  Filled 2014-09-03: qty 2
  Filled 2014-09-03 (×6): qty 1

## 2014-09-03 MED ORDER — SODIUM CHLORIDE 0.9 % IJ SOLN
3.0000 mL | Freq: Two times a day (BID) | INTRAMUSCULAR | Status: DC
  Administered 2014-09-03 – 2014-09-07 (×4): 3 mL via INTRAVENOUS

## 2014-09-03 MED ORDER — DEXTROSE 50 % IV SOLN
50.0000 mL | Freq: Once | INTRAVENOUS | Status: AC
  Administered 2014-09-03: 50 mL via INTRAVENOUS
  Filled 2014-09-03: qty 50

## 2014-09-03 MED ORDER — THIAMINE HCL 100 MG/ML IJ SOLN: Freq: Once | INTRAVENOUS | Status: DC

## 2014-09-03 MED ORDER — FOLIC ACID 1 MG PO TABS
1.0000 mg | ORAL_TABLET | Freq: Every day | ORAL | Status: DC
  Administered 2014-09-03 – 2014-09-09 (×7): 1 mg via ORAL
  Filled 2014-09-03 (×7): qty 1

## 2014-09-03 MED ORDER — GABAPENTIN 300 MG PO CAPS
300.0000 mg | ORAL_CAPSULE | Freq: Three times a day (TID) | ORAL | Status: DC
  Administered 2014-09-03 – 2014-09-04 (×4): 300 mg via ORAL
  Filled 2014-09-03 (×6): qty 1

## 2014-09-03 MED ORDER — SODIUM BICARBONATE 8.4 % IV SOLN: 50.0000 meq | Freq: Once | INTRAVENOUS | Status: DC

## 2014-09-03 MED ORDER — SODIUM POLYSTYRENE SULFONATE 15 GM/60ML PO SUSP
30.0000 g | Freq: Once | ORAL | Status: AC
  Administered 2014-09-03: 30 g via ORAL
  Filled 2014-09-03: qty 120

## 2014-09-03 MED ORDER — SODIUM CHLORIDE 0.9 % IV SOLN
1.0000 g | Freq: Once | INTRAVENOUS | Status: AC
  Administered 2014-09-03: 1 g via INTRAVENOUS
  Filled 2014-09-03: qty 10

## 2014-09-03 MED ORDER — DEXTROSE 50 % IV SOLN
1.0000 | Freq: Once | INTRAVENOUS | Status: AC
  Administered 2014-09-03: 50 mL via INTRAVENOUS
  Filled 2014-09-03: qty 50

## 2014-09-03 MED ORDER — ALBUTEROL (5 MG/ML) CONTINUOUS INHALATION SOLN
10.0000 mg/h | INHALATION_SOLUTION | RESPIRATORY_TRACT | Status: DC
  Administered 2014-09-03: 10 mg/h via RESPIRATORY_TRACT
  Filled 2014-09-03: qty 20

## 2014-09-03 MED ORDER — INSULIN ASPART 100 UNIT/ML ~~LOC~~ SOLN
0.0000 [IU] | Freq: Four times a day (QID) | SUBCUTANEOUS | Status: DC
  Administered 2014-09-03 (×2): 3 [IU] via SUBCUTANEOUS
  Administered 2014-09-03: 1 [IU] via SUBCUTANEOUS
  Filled 2014-09-03: qty 1

## 2014-09-03 MED ORDER — DEXTROSE 5 % IV SOLN
INTRAVENOUS | Status: DC
  Administered 2014-09-03 – 2014-09-04 (×2): via INTRAVENOUS
  Filled 2014-09-03 (×4): qty 150

## 2014-09-03 MED ORDER — ACETAMINOPHEN 650 MG RE SUPP: 650.0000 mg | Freq: Four times a day (QID) | RECTAL | Status: DC | PRN

## 2014-09-03 MED ORDER — INSULIN ASPART 100 UNIT/ML IV SOLN
5.0000 [IU] | Freq: Once | INTRAVENOUS | Status: AC
  Administered 2014-09-03: 5 [IU] via INTRAVENOUS
  Filled 2014-09-03: qty 1

## 2014-09-03 MED ORDER — DEXTROSE-NACL 5-0.45 % IV SOLN
INTRAVENOUS | Status: DC
  Administered 2014-09-03: 08:00:00 via INTRAVENOUS

## 2014-09-03 NOTE — Consult Note (Signed)
Referring Provider: No ref. provider found Primary Care Physician:  Philis Fendt, MD Primary Nephrologist:    Reason for Consultation:   Acute hyperkalemic renal failure metabolic acidosis  HPI:  64 year old man with history of alcohol abuse has been feeling unwell for the last 3 days.Feels shaky. With poor oral intake. History of diabetes and uses metformin. No insulin . He lives alone although he has family in [place]. He uses daily NSAIDS for back pain. Mostly aleve. He was brought to ER by his sister. He was found to have an elevated potassium.   Past Medical History  Diagnosis Date  . Sickle cell anemia     Hgb SS disease   . Diabetes mellitus   . Hypertension   . Renal insufficiency     Past Surgical History  Procedure Laterality Date  . Colon surgery      Colon cancer surgery, removed small amt not full colectomy  . Cholecystectomy    . Leg surgery      For ? gangrene after running into barbwire fence at 64yo  . Colonoscopy      nclear who follows him for this    Prior to Admission medications   Medication Sig Start Date End Date Taking? Authorizing Provider  amLODipine-benazepril (LOTREL) 10-20 MG per capsule Take 1 capsule by mouth daily.  09/01/14  Yes Historical Provider, MD  gabapentin (NEURONTIN) 300 MG capsule Take 300 mg by mouth 3 (three) times daily.  09/01/14  Yes Historical Provider, MD  hydrochlorothiazide (MICROZIDE) 12.5 MG capsule Take 12.5 mg by mouth daily. 08/04/14  Yes Historical Provider, MD  metFORMIN (GLUCOPHAGE) 1000 MG tablet Take 1,000 mg by mouth 2 (two) times daily with a meal.  09/01/14  Yes Historical Provider, MD  omeprazole (PRILOSEC) 20 MG capsule Take 20 mg by mouth daily.  08/12/14  Yes Historical Provider, MD  ONGLYZA 5 MG TABS tablet Take 5 mg by mouth daily. 06/09/14  Yes Historical Provider, MD  acetaminophen (TYLENOL) 325 MG tablet Take 2 tablets (650 mg total) by mouth every 6 (six) hours as needed for mild pain or fever. Patient not  taking: Reported on 09/03/2014 04/18/13   Robbie Lis, MD  ALPRAZolam Duanne Moron) 0.25 MG tablet Take 1-2 tablets (0.25-0.5 mg total) by mouth 3 (three) times daily. Patient not taking: Reported on 09/03/2014 04/18/13   Robbie Lis, MD  amLODipine (NORVASC) 10 MG tablet Take 1 tablet (10 mg total) by mouth daily. Patient not taking: Reported on 09/03/2014 04/18/13   Robbie Lis, MD  bisacodyl (DULCOLAX) 10 MG suppository Place 1 suppository (10 mg total) rectally daily as needed for moderate constipation. Patient not taking: Reported on 09/03/2014 04/18/13   Robbie Lis, MD  diphenhydrAMINE (BENADRYL) 25 mg capsule Take 1-2 capsules (25-50 mg total) by mouth every 4 (four) hours as needed for itching. Patient not taking: Reported on 09/03/2014 04/18/13   Robbie Lis, MD  docusate sodium 100 MG CAPS Take 100 mg by mouth 2 (two) times daily as needed for mild constipation. Patient not taking: Reported on 09/03/2014 04/18/13   Robbie Lis, MD  folic acid (FOLVITE) 1 MG tablet Take 1 tablet (1 mg total) by mouth daily. Patient not taking: Reported on 09/03/2014 04/18/13   Robbie Lis, MD  gabapentin (NEURONTIN) 100 MG capsule Take 1 capsule (100 mg total) by mouth 3 (three) times daily. Patient not taking: Reported on 09/03/2014 04/18/13   Robbie Lis, MD  glipiZIDE Faylene Kurtz  XL) 10 MG 24 hr tablet Take 1 tablet (10 mg total) by mouth daily. Patient not taking: Reported on 09/03/2014 04/18/13   Robbie Lis, MD  HYDROcodone-acetaminophen (NORCO/VICODIN) 5-325 MG per tablet Take 2 tablets by mouth every 4 (four) hours as needed for moderate pain. Patient not taking: Reported on 09/03/2014 04/18/13   Robbie Lis, MD  labetalol (NORMODYNE) 100 MG tablet Take 1 tablet (100 mg total) by mouth 2 (two) times daily. Patient not taking: Reported on 09/03/2014 04/18/13   Robbie Lis, MD  ondansetron (ZOFRAN) 4 MG tablet Take 1 tablet (4 mg total) by mouth every 4 (four) hours as needed for  nausea. Patient not taking: Reported on 09/03/2014 04/18/13   Robbie Lis, MD  pantoprazole (PROTONIX) 40 MG tablet Take 1 tablet (40 mg total) by mouth daily. Patient not taking: Reported on 09/03/2014 04/18/13   Robbie Lis, MD  polyethylene glycol [facility] / Floria Raveling) packet Take 17 g by mouth daily as needed for mild constipation. Patient not taking: Reported on 09/03/2014 04/18/13   Robbie Lis, MD  sitaGLIPtin (JANUVIA) 25 MG tablet Take 1 tablet (25 mg total) by mouth daily. Patient not taking: Reported on 09/03/2014 04/18/13   Robbie Lis, MD    Current Facility-Administered Medications  Medication Dose Route Frequency Provider Last Rate Last Dose  . acetaminophen (TYLENOL) tablet 650 mg  650 mg Oral Q6H PRN Lavina Hamman, MD       Or  . acetaminophen (TYLENOL) suppository 650 mg  650 mg Rectal Q6H PRN Lavina Hamman, MD      . amLODipine (NORVASC) tablet 10 mg  10 mg Oral Daily Lavina Hamman, MD   10 mg at 09/03/14 1038  . folic acid (FOLVITE) tablet 1 mg  1 mg Oral Daily Lavina Hamman, MD   1 mg at 09/03/14 1039  . gabapentin (NEURONTIN) capsule 300 mg  300 mg Oral TID Lavina Hamman, MD   300 mg at 09/03/14 1038  . insulin aspart (novoLOG) injection 0-9 Units  0-9 Units Subcutaneous Q6H Lavina Hamman, MD   1 Units at 09/03/14 1231  . ondansetron (ZOFRAN) tablet 4 mg  4 mg Oral Q6H PRN Lavina Hamman, MD       Or  . ondansetron [facility]) injection 4 mg  4 mg Intravenous Q6H PRN Lavina Hamman, MD      . pantoprazole (PROTONIX) EC tablet 40 mg  40 mg Oral Daily Lavina Hamman, MD   40 mg at 09/03/14 1039  . sodium bicarbonate 150 mEq in dextrose 5 % 1,000 mL infusion   Intravenous Continuous Edrick Oh, MD      . sodium chloride 0.9 % injection 3 mL  3 mL Intravenous Q12H Lavina Hamman, MD   3 mL at 09/03/14 1040  . thiamine (VITAMIN B-1) tablet 100 mg  100 mg Oral Daily Lavina Hamman, MD   100 mg at 09/03/14 1038    Allergies as of 09/03/2014 - Review Complete  09/03/2014  Allergen Reaction Noted  . Morphine and related Itching and Swelling 07/27/2011    Family History  Problem Relation Age of Onset  . Venous thrombosis Mother     Dec. of blood clot   . Diabetes type II Mother   . Throat cancer Father     Deceased of throat cancer    History   Social History  . Marital Status: Divorced    Spouse  Name: N/A  . Number of Children: N/A  . Years of Education: N/A   Occupational History  . Not on file.   Social History Main Topics  . Smoking status: Former Smoker -- 0.50 packs/day for 20 years    Quit date: 06/05/1966  . Smokeless tobacco: Not on file  . Alcohol Use: Yes     Comment: 1-2 times per week  . Drug Use: Yes    Special: Cocaine     Comment: monday - smoke crack  . Sexual Activity: Not on file   Other Topics Concern  . Not on file   Social History Narrative   Retired Administrator, on disability. Former smoker. Living by himself, able to get around [facility].    Has a wife who checks on him but they don't live together sicne 2007   Quit drinking     Review of Systems: Gen: Denies any fever, chills, sweats, anorexia, fatigue, weakness, malaise, weight loss, and sleep disorder HEENT: No visual complaints, No history of Retinopathy. Normal external appearance No Epistaxis or Sore throat. No sinusitis.   CV: Denies chest pain, angina, palpitations, syncope, orthopnea, PND, peripheral edema, and claudication. Resp: Denies dyspnea at rest, dyspnea with exercise, cough, sputum, wheezing, coughing up blood, and pleurisy. GI: Denies vomiting blood, jaundice, and fecal incontinence.   Denies dysphagia or odynophagia. GU : Denies urinary burning, blood in urine, urinary frequency, urinary hesitancy, nocturnal urination, and urinary incontinence.  No renal calculi. MS: Denies joint pain, limitation of movement, and swelling, stiffness, low back pain, extremity pain. Denies muscle weakness, cramps, atrophy.  No use of non steroidal  antiinflammatory drugs. Derm: Denies rash, itching, dry skin, hives, moles, warts, or unhealing ulcers.  Psych: Denies depression, anxiety, memory loss, suicidal ideation, hallucinations, paranoia, and confusion. Heme: Denies bruising, bleeding, and enlarged lymph nodes. Neuro: No headache.  No diplopia. No dysarthria.  No dysphasia.  No history of CVA.  No Seizures. No paresthesias.  No weakness. Endocrine No DM.  No Thyroid disease.  No Adrenal disease.  Physical Exam: Vital signs in last 24 hours: Temp:  [97.7 F (36.5 C)-98.5 F (36.9 C)] 98.2 F (36.8 C) (03/31 1321) Pulse Rate:  [70-106] 105 (03/31 1400) Resp:  [14-24] 23 (03/31 1400) BP: (107-165)/(56-129) 157/90 mmHg (03/31 1321) SpO2:  [88 %-100 %] 98 % (03/31 1400) Weight:  [79.379 kg (175 lb)-81.6 kg (179 lb 14.3 oz)] 81.6 kg (179 lb 14.3 oz) (03/31 1321) Last BM Date: 09/03/14 General:   Alert,  Well-developed, well-nourished, pleasant and cooperative in NAD Head:  Normocephalic and atraumatic. Eyes:  Sclera clear, no icterus.   Conjunctiva pink. Ears:  Normal auditory acuity. Nose:  No deformity, discharge,  or lesions. Mouth:  No deformity or lesions, dentition normal. Neck:  Supple; no masses or thyromegaly. JVP not elevated Lungs:  Clear throughout to auscultation.   No wheezes, crackles, or rhonchi. No acute distress. Heart:  Regular rate and rhythm; no murmurs, clicks, rubs,  or gallops. Abdomen:  Soft, nontender and nondistended. No masses, hepatosplenomegaly or hernias noted. Normal bowel sounds, without guarding, and without rebound.   Msk:  Symmetrical without gross deformities. Normal posture. Pulses:  No carotid, renal, femoral bruits. DP and PT symmetrical and equal Extremities:  Without clubbing or edema. Neurologic:  Alert and  oriented x4;  grossly normal neurologically. Skin:  Intact without significant lesions or rashes. Cervical Nodes:  No significant cervical adenopathy. Psych:  Alert and  cooperative. Normal mood and affect.  Intake/Output from  previous day:   Intake/Output this shift: Total I/O In: 120 [P.O.:120] Out: 1 [Stool:1]  Lab Results:  Recent Labs  09/03/14 0046 09/03/14 0358  WBC 12.3* 12.4*  HGB 7.3* 6.9*  HCT 22.7* 21.1*  PLT 308 340   BMET  Recent Labs  09/03/14 0046 09/03/14 0358 09/03/14 0750 09/03/14 1200  NA 135 134*  --  140  K 5.8* 6.5*  --  6.0*  CL 107 109  --  112  CO2 16* 18*  --  17*  GLUCOSE 51* 150*  --  168*  BUN 43* 44*  --  38*  CREATININE 3.70* 3.61*  --  3.30*  CALCIUM 9.9 9.2  --  9.5  PHOS  --   --  4.9*  --    LFT  Recent Labs  09/03/14 0046  PROT 8.4*  ALBUMIN 4.8  AST 31  ALT 15  ALKPHOS 59  BILITOT 1.5*   PT/INR  Recent Labs  09/03/14 0750  LABPROT 14.6  INR 1.13   Hepatitis Panel No results for input(s): HEPBSAG, HCVAB, HEPAIGM, HEPBIGM in the last 72 hours.  Studies/Results: Dg Chest 2 View  09/03/2014   CLINICAL DATA:  Leukocytosis.  Acute encephalopathy  EXAM: CHEST  2 VIEW  COMPARISON:  04/13/2013  FINDINGS: Heart size and vascularity are normal. Lungs are clear without infiltrate or effusion.  Skeletal changes of Sickle cell  IMPRESSION: No active cardiopulmonary disease.   Electronically Signed   By: Franchot Gallo M.D.   On: 09/03/2014 07:37   Ct Head Wo Contrast  09/03/2014   CLINICAL DATA:  64 year old male with left side numbness. Lightheadedness for several days. Encephalopathy. Initial encounter.  EXAM: CT HEAD WITHOUT CONTRAST  TECHNIQUE: Contiguous axial images were obtained from the base of the skull through the vertex without intravenous contrast.  COMPARISON:  None.  FINDINGS: Visualized paranasal sinuses and mastoids are clear. No acute osseous abnormality identified. No acute orbit or scalp soft tissue findings. Postoperative changes to the right globe.  Cerebral volume is within normal limits for age. No ventriculomegaly. No midline shift, mass effect, or evidence of  intracranial mass lesion. Minimal nonspecific white matter hypodensity, otherwise normal gray-white matter differentiation. No evidence of cortically based acute infarction identified. No suspicious intracranial vascular hyperdensity. No acute intracranial hemorrhage identified.  IMPRESSION: Negative for age non contrast CT appearance of the brain.   Electronically Signed   By: Genevie Ann M.D.   On: 09/03/2014 07:17   US Renal  09/03/2014   CLINICAL DATA:  64 year old male with acute renal injury. Acute encephalopathy. Initial encounter.  EXAM: RENAL/URINARY TRACT ULTRASOUND COMPLETE  COMPARISON:  CT Abdomen and Pelvis 09/03/2003  FINDINGS: Right Kidney:  Length: 9.8 cm. Echogenic somewhat heterogeneous renal cortex (image 4). No hydronephrosis or right renal mass.  Left Kidney:  Length: 11.7 cm. Echogenic renal cortex similar to the right side. No hydronephrosis or left renal mass.  Bladder:  Appears normal for degree of bladder distention.  IMPRESSION: Chronic medical renal disease. No hydronephrosis or renal mass identified.   Electronically Signed   By: Genevie Ann M.D.   On: 09/03/2014 09:01    Assessment/Plan:  Acute renal failure  Will place a foley and check labs including renal ultrasound and urinalysis. I suspect this represents volume depletion. I agree with aggressive rehydration with IV fluids. Will hold off on serological evaluation at this point. He has been using NSAIDS daily and this could be contributing to his worsening renal function  Hyperkalemia no EKG changes on rhythm strip in room. Agree with the current management and will change IV fluids to contain bicarbonate in order to correct acidosis  Metabolic acidosis  IV bicarbonate at present will need to monitor and prevent an overshoot alkalosis.  Alcohol use would recommend IV thiamine and and multivitamins to avoid Wernicke/Korsakoff syndrome would also monitor closely for delirium tremons   LOS: 0 [place]rea Kirt W @TODAY @3 :22  PM

## 2014-09-03 NOTE — ED Notes (Signed)
CBG 118 

## 2014-09-03 NOTE — H&P (Addendum)
[facility] History and Physical  Patient: [name]  MRN: [id number]  DOB: [birth year]  DOS: the patient was seen and examined on 09/03/2014 PCP: Philis Fendt, MD  Chief Complaint: Not feeling good  HPI: [name] is a 64 y.o. male with Past medical history of hypertension, diabetes mellitus, sickle cell anemia, chronic kidney disease, cocaine abuse, alcohol use. The patient is presenting with complaints of not feeling well, confusion, left-sided numbness. Reportedly his initial complaining ED was tremors. Initially patient's family also reported that the patient was appearing confused and was not speaking clearly. Patient denied any complaint of headache, chest pain, abdominal pain, pain anywhere else. Patient denied any blurred vision or speech difficulty. Patient denied any complaints of cough or shortness of breath or nausea or vomiting or diarrhea or constipation or burning urination to me. Patient mentions he has been taking all his medications. Patient mentions Onglyza is a new medication other than that no changes in his medications reported. Initially in the ER he mentions he had vomiting and diarrhea 2 weeks earlier. Patient drinks 102 beer on a daily basis with last drink being on 09/02/2014. Patient also smokes crack last use 3 days ago.  The patient is coming from home And at his baseline independent for most of his ADL.  Review of Systems: as mentioned in the history of present illness.  A Comprehensive review of the other systems is negative.  Past Medical History  Diagnosis Date  . Sickle cell anemia     Hgb SS disease   . Diabetes mellitus   . Hypertension    Past Surgical History  Procedure Laterality Date  . Colon surgery      Colon cancer surgery, removed small amt not full colectomy  . Cholecystectomy    . Leg surgery      For ? gangrene after running into barbwire fence at 64yo  . Colonoscopy      nclear who follows him for this    Social History:  reports that he quit smoking about 48 years ago. He does not have any smokeless tobacco history on file. He reports that he drinks alcohol. He reports that he uses illicit drugs (Cocaine).  Allergies  Allergen Reactions  . Morphine And Related Itching and Swelling    Family History  Problem Relation Age of Onset  . Venous thrombosis Mother     Dec. of blood clot   . Diabetes type II Mother   . Throat cancer Father     Deceased of throat cancer    Prior to Admission medications   Medication Sig Start Date End Date Taking? Authorizing Provider  amLODipine-benazepril (LOTREL) 10-20 MG per capsule Take 1 capsule by mouth daily.  09/01/14  Yes Historical Provider, MD  gabapentin (NEURONTIN) 300 MG capsule Take 300 mg by mouth 3 (three) times daily.  09/01/14  Yes Historical Provider, MD  hydrochlorothiazide (MICROZIDE) 12.5 MG capsule Take 12.5 mg by mouth daily. 08/04/14  Yes Historical Provider, MD  metFORMIN (GLUCOPHAGE) 1000 MG tablet Take 1,000 mg by mouth 2 (two) times daily with a meal.  09/01/14  Yes Historical Provider, MD  omeprazole (PRILOSEC) 20 MG capsule Take 20 mg by mouth daily.  08/12/14  Yes Historical Provider, MD  ONGLYZA 5 MG TABS tablet Take 5 mg by mouth daily. 06/09/14  Yes Historical Provider, MD  acetaminophen (TYLENOL) 325 MG tablet Take 2 tablets (650 mg total) by mouth every 6 (six) hours as needed for mild pain or  fever. Patient not taking: Reported on 09/03/2014 04/18/13   Robbie Lis, MD  ALPRAZolam Duanne Moron) 0.25 MG tablet Take 1-2 tablets (0.25-0.5 mg total) by mouth 3 (three) times daily. Patient not taking: Reported on 09/03/2014 04/18/13   Robbie Lis, MD  amLODipine (NORVASC) 10 MG tablet Take 1 tablet (10 mg total) by mouth daily. Patient not taking: Reported on 09/03/2014 04/18/13   Robbie Lis, MD  bisacodyl (DULCOLAX) 10 MG suppository Place 1 suppository (10 mg total) rectally daily as needed for moderate constipation. Patient not  taking: Reported on 09/03/2014 04/18/13   Robbie Lis, MD  diphenhydrAMINE (BENADRYL) 25 mg capsule Take 1-2 capsules (25-50 mg total) by mouth every 4 (four) hours as needed for itching. Patient not taking: Reported on 09/03/2014 04/18/13   Robbie Lis, MD  docusate sodium 100 MG CAPS Take 100 mg by mouth 2 (two) times daily as needed for mild constipation. Patient not taking: Reported on 09/03/2014 04/18/13   Robbie Lis, MD  folic acid (FOLVITE) 1 MG tablet Take 1 tablet (1 mg total) by mouth daily. Patient not taking: Reported on 09/03/2014 04/18/13   Robbie Lis, MD  gabapentin (NEURONTIN) 100 MG capsule Take 1 capsule (100 mg total) by mouth 3 (three) times daily. Patient not taking: Reported on 09/03/2014 04/18/13   Robbie Lis, MD  glipiZIDE (GLUCOTROL XL) 10 MG 24 hr tablet Take 1 tablet (10 mg total) by mouth daily. Patient not taking: Reported on 09/03/2014 04/18/13   Robbie Lis, MD  HYDROcodone-acetaminophen (NORCO/VICODIN) 5-325 MG per tablet Take 2 tablets by mouth every 4 (four) hours as needed for moderate pain. Patient not taking: Reported on 09/03/2014 04/18/13   Robbie Lis, MD  labetalol (NORMODYNE) 100 MG tablet Take 1 tablet (100 mg total) by mouth 2 (two) times daily. Patient not taking: Reported on 09/03/2014 04/18/13   Robbie Lis, MD  ondansetron (ZOFRAN) 4 MG tablet Take 1 tablet (4 mg total) by mouth every 4 (four) hours as needed for nausea. Patient not taking: Reported on 09/03/2014 04/18/13   Robbie Lis, MD  pantoprazole (PROTONIX) 40 MG tablet Take 1 tablet (40 mg total) by mouth daily. Patient not taking: Reported on 09/03/2014 04/18/13   Robbie Lis, MD  polyethylene glycol [facility] / Floria Raveling) packet Take 17 g by mouth daily as needed for mild constipation. Patient not taking: Reported on 09/03/2014 04/18/13   Robbie Lis, MD  sitaGLIPtin (JANUVIA) 25 MG tablet Take 1 tablet (25 mg total) by mouth daily. Patient not taking: Reported on 09/03/2014  04/18/13   Robbie Lis, MD    Physical Exam: Filed Vitals:   09/03/14 0545 09/03/14 0600 09/03/14 0615 09/03/14 0630  BP: 141/70 140/61 130/62 136/67  Pulse: 92 94 95 97  Temp:      Resp: 22 23 17 19   Height:      Weight:      SpO2: 100% 100% 100% 100%    General: Alert, Awake and Oriented to Time, Place and Person. Appear in mild distress Eyes: PERRL ENT: Oral Mucosa clear moist. Neck: no JVD Cardiovascular: S1 and S2 Present, aortic systolic Murmur, Peripheral Pulses Present Respiratory: Bilateral Air entry equal and Decreased, Clear to Auscultation, noCrackles, no wheezes Abdomen: Bowel Sound present, Soft and non tender Skin: no Rash Extremities: no Pedal edema, no calf tenderness Neurologic: Weak the on the left upper extremity but bilaterally equal flexion of upper extremity, No pronator drift  Labs on Admission:  CBC:  Recent Labs Lab 09/03/14 0046 09/03/14 0358  WBC 12.3* 12.4*  NEUTROABS  --  6.2  HGB 7.3* 6.9*  HCT 22.7* 21.1*  MCV 72.3* 71.0*  PLT 308 340    CMP     Component Value Date/Time   NA 134* 09/03/2014 0358   K 6.5* 09/03/2014 0358   CL 109 09/03/2014 0358   CO2 18* 09/03/2014 0358   GLUCOSE 150* 09/03/2014 0358   BUN 44* 09/03/2014 0358   CREATININE 3.61* 09/03/2014 0358   CALCIUM 9.2 09/03/2014 0358   PROT 8.4* 09/03/2014 0046   ALBUMIN 4.8 09/03/2014 0046   AST 31 09/03/2014 0046   ALT 15 09/03/2014 0046   ALKPHOS 59 09/03/2014 0046   BILITOT 1.5* 09/03/2014 0046   GFRNONAA 16* 09/03/2014 0358   GFRAA 19* 09/03/2014 0358    No results for input(s): LIPASE, AMYLASE in the last 168 hours.  No results for input(s): CKTOTAL, CKMB, CKMBINDEX, TROPONINI in the last 168 hours. BNP (last 3 results) No results for input(s): BNP in the last 8760 hours.  ProBNP (last 3 results) No results for input(s): PROBNP in the last 8760 hours.   Radiological Exams on Admission: No results found.  EKG: Independently reviewed. normal sinus  rhythm, LBBB, ST depression in inferior leads.  Assessment/Plan Principal Problem:   Hyperkalemia Active Problems:   Sickle cell anemia   Hypertension   Diabetes mellitus   LBBB (left bundle branch block)   CKD (chronic kidney disease), stage III   AKI (acute kidney injury)   1. Hyperkalemia  Acute kidney injury.  The patient is presenting with complaints of tremors, confusion, fatigue. He had an episode of nausea and vomiting she weeks ago. Currently he is found to be having acute on chronic kidney injury along with hyperkalemia and some EKG changes but no hot T waves or prolonged QTC. Patient has received calcium, sodium bicarbonate, insulin and dextrose in the ER. We will request EDP to give Kayexalate. Nephrology will also be consulted. Ultrasound renal will also be obtained. Most likely etiology is prerenal therefore we will give him IV fluids. May need to stop metformin as well as ACE inhibitor, currently holding.  2. Acute encephalopathy. Left-sided numbness Etiology currently unclear, patient reportedly had some left-sided numbness but on exam sensitivity examination is bilaterally equal and symmetrical. He has some weakness on left upper extremity. Patient was significantly anxious initially requiring Ativan in the ER. With this I would obtain CT of the head, UDS, alcohol level  3. Anemia, history of sickle cell disease. Patient has history of sickle cell anemia and most likely may be having chronically low hemoglobin. Will check LDH, reticulocyte count, INR, type and screen. Gentle IV hydration will be continued. Currently no evidence of sickle cell crisis. Check FOBT  4. LBBB with EKG changes. Chronic LBBB. No chest pain no chest heaviness or shortness of breath. Most likely due to hyperkalemia. We'll check troponin.  5. Diabetes mellitus. Placing him on sliding scale. Holding metformin. Patient is currently hypoglycemic. He may require dextrose  infusion. CBG every hour until blood sugar stabilizes above 150.  6. Mild leukocytosis. Etiology unclear. Most likely stress reaction. Rule out infection chest x-ray. Urine clear.  Advance goals of care discussion: Full code presumed   Consults: Nephrology  DVT Prophylaxis: Mechanical compression. Nutrition: Nothing by mouth except medications  Disposition: Admitted to inpatient in step-down unit.  Author: Berle Mull, MD [facility] Pager: [phone] 09/03/2014, 6:51 AM  If 7PM-7AM, please contact night-coverage [email or web address] Password [email or web address]

## 2014-09-03 NOTE — ED Notes (Signed)
Pt's ex-wife, [name]. [phone] (cell).

## 2014-09-03 NOTE — ED Notes (Signed)
CBG 149. 

## 2014-09-03 NOTE — ED Notes (Addendum)
MD Docherty verbal to give juice due to CBG of 69.

## 2014-09-03 NOTE — Progress Notes (Signed)
CRITICAL VALUE ALERT  Critical value received: K+6.1  Date of notification:  09/03/2014   Time of notification:  1819  Critical value read back:yes  Nurse who received alert:  Donnella Bi, RN  MD notified (1st page):  Dr. Lunette Stands  Time of first page: 1825  MD notified (2nd page):  Time of second page:  Responding MD:   Time MD responded:

## 2014-09-03 NOTE — ED Notes (Signed)
CBG 47.

## 2014-09-03 NOTE — ED Provider Notes (Signed)
CSN: [id number]     Arrival date & time 09/03/14  0025 History  This chart was scribed for Ernestina Patches, MD by Rayfield Citizen, ED Scribe. This patient was seen in room D36C/D36C and the patient's care was started at 1:26 AM.    Chief Complaint  Patient presents with  . Dizziness  . Altered Mental Status   Patient is a 64 y.o. male presenting with dizziness and altered mental status. The history is provided by the patient and a relative. No language interpreter was used.  Dizziness Associated symptoms: no chest pain, no diarrhea, no headaches, no nausea, no shortness of breath, no vomiting and no weakness   Altered Mental Status Presenting symptoms: confusion   Associated symptoms: light-headedness   Associated symptoms: no abdominal pain, no agitation, no fever, no headaches, no nausea, no rash, no vomiting and no weakness      HPI Comments: [name] is a 64 y.o. male with past medical history of DM (managed with Metformin 2x and glucophage) who presents to the Emergency Department complaining of AMS, tremors. Patient reports that two days PTA, he began to feel "shaky and nervous" with tremors, numbness and paresthesias in his hands and feet. He believed his blood sugar was abnormal at that time, though he states he has been taking his DM medications as prescribed. He has been eating normally; he had oatmeal for breakfast, fried chicken for lunch, and fried chicken, green beans, and cornbread for dinner. Patient's family reports that he called her tonight and sounded unclear and confused; she brought him to the ED due to her concern for his symptoms. He reports an episode of vomiting, diarrhea two weeks PTA. He denies fevers, chills. He denies dysuria. He denies any other concerns at this time. He denies prior experience with similar symptoms; his sugar normally runs high, rather than low.  He has been "too shaky" to check his blood sugar the past two days but reports that he normally checks  his sugar when he "feels bad; feel hyped, no energy, have a headache."  Patient notes that he recently changed fluid pills; he denies any other recent medication changes.   He reports occasional EtOH use; a "beer or two." He had "a sip" of a beer tonight but did not finish it. He normally does not take his medication when drinking. He has smoked crack cocaine approximately weekly for the past 19 years; last use three days PTA (08/31/14).   PCP [facility].   Past Medical History  Diagnosis Date  . Sickle cell anemia     Hgb SS disease   . Diabetes mellitus   . Hypertension   . Renal insufficiency    Past Surgical History  Procedure Laterality Date  . Colon surgery      Colon cancer surgery, removed small amt not full colectomy  . Cholecystectomy    . Leg surgery      For ? gangrene after running into barbwire fence at 64yo  . Colonoscopy      nclear who follows him for this   Family History  Problem Relation Age of Onset  . Venous thrombosis Mother     Dec. of blood clot   . Diabetes type II Mother   . Throat cancer Father     Deceased of throat cancer   History  Substance Use Topics  . Smoking status: Former Smoker -- 0.50 packs/day for 20 years    Quit date: 06/05/1966  . Smokeless tobacco: Not on  file  . Alcohol Use: Yes     Comment: 1-2 times per week    Review of Systems  Constitutional: Negative for fever, activity change, appetite change and fatigue.  HENT: Negative for congestion, facial swelling, rhinorrhea and trouble swallowing.   Eyes: Negative for photophobia and pain.  Respiratory: Negative for cough, chest tightness and shortness of breath.   Cardiovascular: Negative for chest pain and leg swelling.  Gastrointestinal: Negative for nausea, vomiting, abdominal pain, diarrhea and constipation.  Endocrine: Negative for polydipsia and polyuria.  Genitourinary: Negative for dysuria, urgency, decreased urine volume and difficulty urinating.  Musculoskeletal:  Negative for back pain and gait problem.  Skin: Negative for color change, rash and wound.  Allergic/Immunologic: Negative for immunocompromised state.  Neurological: Positive for dizziness, tremors and light-headedness. Negative for facial asymmetry, speech difficulty, weakness, numbness and headaches.  Psychiatric/Behavioral: Positive for confusion. Negative for decreased concentration and agitation. The patient is nervous/anxious.     Allergies  Morphine and related  Home Medications   Prior to Admission medications   Medication Sig Start Date End Date Taking? Authorizing Provider  amLODipine-benazepril (LOTREL) 10-20 MG per capsule Take 1 capsule by mouth daily.  09/01/14  Yes Historical Provider, MD  gabapentin (NEURONTIN) 300 MG capsule Take 300 mg by mouth 3 (three) times daily.  09/01/14  Yes Historical Provider, MD  hydrochlorothiazide (MICROZIDE) 12.5 MG capsule Take 12.5 mg by mouth daily. 08/04/14  Yes Historical Provider, MD  metFORMIN (GLUCOPHAGE) 1000 MG tablet Take 1,000 mg by mouth 2 (two) times daily with a meal.  09/01/14  Yes Historical Provider, MD  omeprazole (PRILOSEC) 20 MG capsule Take 20 mg by mouth daily.  08/12/14  Yes Historical Provider, MD  ONGLYZA 5 MG TABS tablet Take 5 mg by mouth daily. 06/09/14  Yes Historical Provider, MD  acetaminophen (TYLENOL) 325 MG tablet Take 2 tablets (650 mg total) by mouth every 6 (six) hours as needed for mild pain or fever. Patient not taking: Reported on 09/03/2014 04/18/13   Robbie Lis, MD  ALPRAZolam Duanne Moron) 0.25 MG tablet Take 1-2 tablets (0.25-0.5 mg total) by mouth 3 (three) times daily. Patient not taking: Reported on 09/03/2014 04/18/13   Robbie Lis, MD  amLODipine (NORVASC) 10 MG tablet Take 1 tablet (10 mg total) by mouth daily. Patient not taking: Reported on 09/03/2014 04/18/13   Robbie Lis, MD  bisacodyl (DULCOLAX) 10 MG suppository Place 1 suppository (10 mg total) rectally daily as needed for moderate  constipation. Patient not taking: Reported on 09/03/2014 04/18/13   Robbie Lis, MD  diphenhydrAMINE (BENADRYL) 25 mg capsule Take 1-2 capsules (25-50 mg total) by mouth every 4 (four) hours as needed for itching. Patient not taking: Reported on 09/03/2014 04/18/13   Robbie Lis, MD  docusate sodium 100 MG CAPS Take 100 mg by mouth 2 (two) times daily as needed for mild constipation. Patient not taking: Reported on 09/03/2014 04/18/13   Robbie Lis, MD  folic acid (FOLVITE) 1 MG tablet Take 1 tablet (1 mg total) by mouth daily. Patient not taking: Reported on 09/03/2014 04/18/13   Robbie Lis, MD  gabapentin (NEURONTIN) 100 MG capsule Take 1 capsule (100 mg total) by mouth 3 (three) times daily. Patient not taking: Reported on 09/03/2014 04/18/13   Robbie Lis, MD  glipiZIDE (GLUCOTROL XL) 10 MG 24 hr tablet Take 1 tablet (10 mg total) by mouth daily. Patient not taking: Reported on 09/03/2014 04/18/13   Robbie Lis, MD  HYDROcodone-acetaminophen (NORCO/VICODIN) 5-325 MG per tablet Take 2 tablets by mouth every 4 (four) hours as needed for moderate pain. Patient not taking: Reported on 09/03/2014 04/18/13   Robbie Lis, MD  labetalol (NORMODYNE) 100 MG tablet Take 1 tablet (100 mg total) by mouth 2 (two) times daily. Patient not taking: Reported on 09/03/2014 04/18/13   Robbie Lis, MD  ondansetron (ZOFRAN) 4 MG tablet Take 1 tablet (4 mg total) by mouth every 4 (four) hours as needed for nausea. Patient not taking: Reported on 09/03/2014 04/18/13   Robbie Lis, MD  pantoprazole (PROTONIX) 40 MG tablet Take 1 tablet (40 mg total) by mouth daily. Patient not taking: Reported on 09/03/2014 04/18/13   Robbie Lis, MD  polyethylene glycol [facility] / Floria Raveling) packet Take 17 g by mouth daily as needed for mild constipation. Patient not taking: Reported on 09/03/2014 04/18/13   Robbie Lis, MD  sitaGLIPtin (JANUVIA) 25 MG tablet Take 1 tablet (25 mg total) by mouth daily. Patient not  taking: Reported on 09/03/2014 04/18/13   Robbie Lis, MD   BP 136/68 mmHg  Pulse 78  Temp(Src) 98.8 F (37.1 C) (Oral)  Resp 16  Ht 6\' 1"  (1.854 m)  Wt 179 lb 14.3 oz (81.6 kg)  BMI 23.74 kg/m2  SpO2 92% Physical Exam  Constitutional: He is oriented to person, place, and time. He appears well-developed and well-nourished. No distress.  Anxious, restless  HENT:  Head: Normocephalic and atraumatic.  Mouth/Throat: No oropharyngeal exudate.  Eyes: Pupils are equal, round, and reactive to light.  Neck: Normal range of motion. Neck supple.  Cardiovascular: Normal rate, regular rhythm and normal heart sounds.  Exam reveals no gallop and no friction rub.   No murmur heard. Pulmonary/Chest: Effort normal and breath sounds normal. No respiratory distress. He has no wheezes. He has no rales.  Abdominal: Soft. Bowel sounds are normal. He exhibits no distension and no mass. There is no tenderness. There is no rebound and no guarding.  Musculoskeletal: Normal range of motion. He exhibits no edema or tenderness.  Neurological: He is alert and oriented to person, place, and time.  Motor agitation  Skin: Skin is warm and dry.  Psychiatric: He has a normal mood and affect.    ED Course  Procedures   DIAGNOSTIC STUDIES: Oxygen Saturation is 97% on RA, adequate by my interpretation.    COORDINATION OF CARE: 1:39 AM Discussed treatment plan with pt at bedside and pt agreed to plan.   Labs Review Labs Reviewed  CBC - Abnormal; Notable for the following:    WBC 12.3 (*)    RBC 3.14 (*)    Hemoglobin 7.3 (*)    HCT 22.7 (*)    MCV 72.3 (*)    MCH 23.2 (*)    RDW 18.7 (*)    All other components within normal limits  COMPREHENSIVE METABOLIC PANEL - Abnormal; Notable for the following:    Potassium 5.8 (*)    CO2 16 (*)    Glucose, Bld 51 (*)    BUN 43 (*)    Creatinine, Ser 3.70 (*)    Total Protein 8.4 (*)    Total Bilirubin 1.5 (*)    GFR calc non Af Amer 16 (*)    GFR calc Af  Amer 18 (*)    All other components within normal limits  CBC WITH DIFFERENTIAL/PLATELET - Abnormal; Notable for the following:    WBC 12.4 (*)    RBC 2.97 (*)  Hemoglobin 6.9 (*)    HCT 21.1 (*)    MCV 71.0 (*)    MCH 23.2 (*)    RDW 18.1 (*)    Eosinophils Relative 8 (*)    Monocytes Absolute 1.5 (*)    Eosinophils Absolute 1.0 (*)    All other components within normal limits  BASIC METABOLIC PANEL - Abnormal; Notable for the following:    Sodium 134 (*)    Potassium 6.5 (*)    CO2 18 (*)    Glucose, Bld 150 (*)    BUN 44 (*)    Creatinine, Ser 3.61 (*)    GFR calc non Af Amer 16 (*)    GFR calc Af Amer 19 (*)    All other components within normal limits  CK TOTAL AND CKMB - Abnormal; Notable for the following:    Total CK 238 (*)    CK, MB 6.7 (*)    Relative Index 2.8 (*)    All other components within normal limits  URINE RAPID DRUG SCREEN (HOSP PERFORMED) - Abnormal; Notable for the following:    Cocaine POSITIVE (*)    All other components within normal limits  RETICULOCYTES - Abnormal; Notable for the following:    Retic Ct Pct 7.0 (*)    RBC. 2.95 (*)    Retic Count, Manual 206.5 (*)    All other components within normal limits  LACTATE DEHYDROGENASE - Abnormal; Notable for the following:    LDH 405 (*)    All other components within normal limits  LACTIC ACID, PLASMA - Abnormal; Notable for the following:    Lactic Acid, Venous 3.5 (*)    All other components within normal limits  LACTIC ACID, PLASMA - Abnormal; Notable for the following:    Lactic Acid, Venous 3.4 (*)    All other components within normal limits  PHOSPHORUS - Abnormal; Notable for the following:    Phosphorus 4.9 (*)    All other components within normal limits  LACTATE DEHYDROGENASE - Abnormal; Notable for the following:    LDH 265 (*)    All other components within normal limits  RETICULOCYTES - Abnormal; Notable for the following:    Retic Ct Pct 7.5 (*)    RBC. 2.94 (*)    Retic  Count, Manual 220.5 (*)    All other components within normal limits  BASIC METABOLIC PANEL - Abnormal; Notable for the following:    Potassium 6.0 (*)    CO2 17 (*)    Glucose, Bld 168 (*)    BUN 38 (*)    Creatinine, Ser 3.30 (*)    GFR calc non Af Amer 18 (*)    GFR calc Af Amer 21 (*)    All other components within normal limits  RENAL FUNCTION PANEL - Abnormal; Notable for the following:    Potassium 6.1 (*)    Chloride 113 (*)    CO2 13 (*)    Glucose, Bld 224 (*)    BUN 36 (*)    Creatinine, Ser 3.29 (*)    GFR calc non Af Amer 18 (*)    GFR calc Af Amer 21 (*)    All other components within normal limits  URINALYSIS, ROUTINE W REFLEX MICROSCOPIC - Abnormal; Notable for the following:    Glucose, UA 250 (*)    All other components within normal limits  GLUCOSE, CAPILLARY - Abnormal; Notable for the following:    Glucose-Capillary 226 (*)    All other  components within normal limits  GLUCOSE, CAPILLARY - Abnormal; Notable for the following:    Glucose-Capillary 223 (*)    All other components within normal limits  GLUCOSE, CAPILLARY - Abnormal; Notable for the following:    Glucose-Capillary 218 (*)    All other components within normal limits  CBG MONITORING, ED - Abnormal; Notable for the following:    Glucose-Capillary 53 (*)    All other components within normal limits  CBG MONITORING, ED - Abnormal; Notable for the following:    Glucose-Capillary 69 (*)    All other components within normal limits  CBG MONITORING, ED - Abnormal; Notable for the following:    Glucose-Capillary 118 (*)    All other components within normal limits  CBG MONITORING, ED - Abnormal; Notable for the following:    Glucose-Capillary 149 (*)    All other components within normal limits  CBG MONITORING, ED - Abnormal; Notable for the following:    Glucose-Capillary 171 (*)    All other components within normal limits  CBG MONITORING, ED - Abnormal; Notable for the following:     Glucose-Capillary 149 (*)    All other components within normal limits  MRSA PCR SCREENING  URINALYSIS, ROUTINE W REFLEX MICROSCOPIC  PROTIME-INR  ETHANOL  TROPONIN I  TSH  MAGNESIUM  IRON AND TIBC  VITAMIN B12  HEMOGLOBIN A1C  HAPTOGLOBIN  FOLATE RBC  RENAL FUNCTION PANEL  TYPE AND SCREEN    Imaging Review Dg Chest 2 View  09/03/2014   CLINICAL DATA:  Leukocytosis.  Acute encephalopathy  EXAM: CHEST  2 VIEW  COMPARISON:  04/13/2013  FINDINGS: Heart size and vascularity are normal. Lungs are clear without infiltrate or effusion.  Skeletal changes of Sickle cell  IMPRESSION: No active cardiopulmonary disease.   Electronically Signed   By: Franchot Gallo M.D.   On: 09/03/2014 07:37   Ct Head Wo Contrast  09/03/2014   CLINICAL DATA:  64 year old male with left side numbness. Lightheadedness for several days. Encephalopathy. Initial encounter.  EXAM: CT HEAD WITHOUT CONTRAST  TECHNIQUE: Contiguous axial images were obtained from the base of the skull through the vertex without intravenous contrast.  COMPARISON:  None.  FINDINGS: Visualized paranasal sinuses and mastoids are clear. No acute osseous abnormality identified. No acute orbit or scalp soft tissue findings. Postoperative changes to the right globe.  Cerebral volume is within normal limits for age. No ventriculomegaly. No midline shift, mass effect, or evidence of intracranial mass lesion. Minimal nonspecific white matter hypodensity, otherwise normal gray-white matter differentiation. No evidence of cortically based acute infarction identified. No suspicious intracranial vascular hyperdensity. No acute intracranial hemorrhage identified.  IMPRESSION: Negative for age non contrast CT appearance of the brain.   Electronically Signed   By: Genevie Ann M.D.   On: 09/03/2014 07:17   US Renal  09/03/2014   CLINICAL DATA:  64 year old male with acute renal injury. Acute encephalopathy. Initial encounter.  EXAM: RENAL/URINARY TRACT ULTRASOUND  COMPLETE  COMPARISON:  CT Abdomen and Pelvis 09/03/2003  FINDINGS: Right Kidney:  Length: 9.8 cm. Echogenic somewhat heterogeneous renal cortex (image 4). No hydronephrosis or right renal mass.  Left Kidney:  Length: 11.7 cm. Echogenic renal cortex similar to the right side. No hydronephrosis or left renal mass.  Bladder:  Appears normal for degree of bladder distention.  IMPRESSION: Chronic medical renal disease. No hydronephrosis or renal mass identified.   Electronically Signed   By: Genevie Ann M.D.   On: 09/03/2014 09:01  EKG Interpretation   Date/Time:  Thursday September 03 2014 04:44:47 EDT Ventricular Rate:  77 PR Interval:  165 QRS Duration: 143 QT Interval:  397 QTC Calculation: 449 R Axis:   106 Text Interpretation:  Sinus rhythm Nonspecific intraventricular conduction  delay Probable anteroseptal infarct, recent Lateral leads are also  involved No significant change since last tracing Confirmed by DOCHERTY   MD, MEGAN ([id number]) on 09/03/2014 4:49:18 AM      MDM   Final diagnoses:  Hyperkalemia  Acute renal failure, unspecified acute renal failure type    Pt is a 64 y.o. male with Pmhx as above who presents with multiple vague symptoms including confusion, agitation, dizziness.  During history patient admits to smoking crack regularly.  At triage, initial blood sugars 47.  Patient given juice.  On physical exam, patient restless, anxious appearing.  Current pulmonary exam and neurologic exam are unremarkable.  Labs show CBC and entirely abnormal ranges, as well as CMP with acute renal failure and hyperkalemia.  Labs are repeated due to multiple abnormalities and concern for possible blood sample error, there were consistent on repeat exam.  EKG with left bundle branch block, which is essentially unchanged from prior, making evaluation for EKG changes with hyperkalemia difficult.  Patient temporized with calcium gluconate IV fluids, Kayexalate, albuterol, insulin, dextrose.  Patient's  motor agitation improved after 2 doses of by mouth Ativan.  Patient admitted to hospitalist group, on stepdown for further treatment of acute renal failure and hyperkalemia.  Nephrology aware.    I personally performed the services described in this documentation, which was scribed in my presence. The recorded information has been reviewed and is accurate.      Ernestina Patches, MD 09/04/14 [phone]

## 2014-09-03 NOTE — ED Notes (Signed)
Spoke with admitting MD, asking for order for potassium recheck as well as diet order. Verbalized will order potassium recheck and pt can eat cardiac diet.

## 2014-09-03 NOTE — ED Notes (Signed)
Pt states for the past 2-3 days he has been feeling lightheadedness, shaking and confusion. States that the last time this occurred it was d/t high blood sugar. States that he takes metformin and glucophage for his diabetes.

## 2014-09-04 LAB — HEMOGLOBIN A1C
HEMOGLOBIN A1C: 4.9 % (ref 4.8–5.6)
Mean Plasma Glucose: 94 mg/dL

## 2014-09-04 LAB — RENAL FUNCTION PANEL
ALBUMIN: 3.3 g/dL — AB (ref 3.5–5.2)
Anion gap: 2 — ABNORMAL LOW (ref 5–15)
BUN: 30 mg/dL — ABNORMAL HIGH (ref 6–23)
CO2: 26 mmol/L (ref 19–32)
CREATININE: 3.17 mg/dL — AB (ref 0.50–1.35)
Calcium: 9.2 mg/dL (ref 8.4–10.5)
Chloride: 114 mmol/L — ABNORMAL HIGH (ref 96–112)
GFR calc Af Amer: 22 mL/min — ABNORMAL LOW (ref >90)
GFR calc non Af Amer: 19 mL/min — ABNORMAL LOW (ref >90)
GLUCOSE: 124 mg/dL — AB (ref 70–99)
POTASSIUM: 4.7 mmol/L (ref 3.5–5.1)
Phosphorus: 4.7 mg/dL — ABNORMAL HIGH (ref 2.3–4.6)
Sodium: 142 mmol/L (ref 135–145)

## 2014-09-04 LAB — GLUCOSE, CAPILLARY
Glucose-Capillary: 205 mg/dL — ABNORMAL HIGH (ref 70–99)
Glucose-Capillary: 132 mg/dL — ABNORMAL HIGH (ref 70–99)
Glucose-Capillary: 191 mg/dL — ABNORMAL HIGH (ref 70–99)
Glucose-Capillary: 150 mg/dL — ABNORMAL HIGH (ref 70–99)
Glucose-Capillary: 210 mg/dL — ABNORMAL HIGH (ref 70–99)

## 2014-09-04 LAB — HAPTOGLOBIN: Haptoglobin: <10 mg/dL — ABNORMAL LOW (ref 34–200)

## 2014-09-04 MED ORDER — HYDROCODONE-ACETAMINOPHEN 5-325 MG PO TABS
1.0000 | ORAL_TABLET | ORAL | Status: DC | PRN
  Administered 2014-09-04 – 2014-09-09 (×18): 1 via ORAL
  Filled 2014-09-04 (×18): qty 1

## 2014-09-04 MED ORDER — INSULIN ASPART 100 UNIT/ML ~~LOC~~ SOLN
0.0000 [IU] | Freq: Three times a day (TID) | SUBCUTANEOUS | Status: DC
  Administered 2014-09-04: 1 [IU] via SUBCUTANEOUS
  Administered 2014-09-05: 3 [IU] via SUBCUTANEOUS
  Administered 2014-09-05: 1 [IU] via SUBCUTANEOUS
  Administered 2014-09-05 – 2014-09-06 (×2): 2 [IU] via SUBCUTANEOUS
  Administered 2014-09-06 (×2): 1 [IU] via SUBCUTANEOUS
  Administered 2014-09-07 (×2): 2 [IU] via SUBCUTANEOUS
  Administered 2014-09-07: 3 [IU] via SUBCUTANEOUS
  Administered 2014-09-08: 2 [IU] via SUBCUTANEOUS
  Administered 2014-09-08: 1 [IU] via SUBCUTANEOUS
  Administered 2014-09-08: 3 [IU] via SUBCUTANEOUS
  Administered 2014-09-09: 1 [IU] via SUBCUTANEOUS

## 2014-09-04 MED ORDER — SODIUM CHLORIDE 0.45 % IV SOLN
INTRAVENOUS | Status: DC
  Administered 2014-09-04: 100 mL/h via INTRAVENOUS
  Administered 2014-09-05 – 2014-09-06 (×4): via INTRAVENOUS

## 2014-09-04 MED ORDER — ACETAMINOPHEN 500 MG PO TABS
500.0000 mg | ORAL_TABLET | Freq: Four times a day (QID) | ORAL | Status: DC | PRN
  Administered 2014-09-07: 500 mg via ORAL
  Filled 2014-09-04 (×2): qty 1

## 2014-09-04 MED ORDER — GABAPENTIN 100 MG PO CAPS
100.0000 mg | ORAL_CAPSULE | Freq: Two times a day (BID) | ORAL | Status: DC
  Administered 2014-09-04 – 2014-09-09 (×10): 100 mg via ORAL
  Filled 2014-09-04 (×11): qty 1

## 2014-09-04 MED ORDER — PNEUMOCOCCAL VAC POLYVALENT 25 MCG/0.5ML IJ INJ
0.5000 mL | INJECTION | INTRAMUSCULAR | Status: AC
  Administered 2014-09-05: 0.5 mL via INTRAMUSCULAR
  Filled 2014-09-04 (×2): qty 0.5

## 2014-09-04 MED ORDER — ACETAMINOPHEN 650 MG RE SUPP: 325.0000 mg | Freq: Four times a day (QID) | RECTAL | Status: DC | PRN

## 2014-09-04 MED ORDER — ALPRAZOLAM 0.25 MG PO TABS: 0.2500 mg | ORAL_TABLET | Freq: Three times a day (TID) | ORAL | Status: DC

## 2014-09-04 MED ORDER — ALPRAZOLAM 0.5 MG PO TABS
0.5000 mg | ORAL_TABLET | Freq: Three times a day (TID) | ORAL | Status: DC
  Administered 2014-09-04 – 2014-09-06 (×6): 0.5 mg via ORAL
  Filled 2014-09-04 (×7): qty 1

## 2014-09-04 MED ORDER — GABAPENTIN 100 MG PO CAPS
100.0000 mg | ORAL_CAPSULE | Freq: Three times a day (TID) | ORAL | Status: DC
  Filled 2014-09-04 (×2): qty 1

## 2014-09-04 NOTE — Progress Notes (Signed)
Patient admitted with left sided numbness, sickle cell disease, CKD., and diabetes. Takes Metformin, Onglyza, Glucotrol, and Januvia for his diabetes at home.  Lab creatinine in hospital is 3.17. Recommend that patient not be on Metformin if creatinine is >1.5 for men.  Will continue to follow while in hospital. Harvel Ricks RN BSN CDE

## 2014-09-04 NOTE — Progress Notes (Signed)
[facility] Rounding Note Subjective:  Says he feels just fine Denies pan or SOB  Objective Vital signs in last 24 hours: Filed Vitals:   09/04/14 0600 09/04/14 0658 09/04/14 0851 09/04/14 1304  BP: 162/83  138/75 138/70  Pulse: 81 79    Temp:   98.3 F (36.8 C) 98.5 F (36.9 C)  TempSrc:   Oral Oral  Resp: 15 19    Height:      Weight:      SpO2: 100% 98%     Weight change: 2.221 kg (4 lb 14.3 oz)  Intake/Output Summary (Last 24 hours) at 09/04/14 1329 Last data filed at 09/04/14 1304  Gross per 24 hour  Intake 2566.33 ml  Output   3600 ml  Net -1033.67 ml    Physical Exam:  BP 138/70 mmHg  Pulse 79  Temp(Src) 98.5 F (36.9 C) (Oral)  Resp 19  Ht 6\' 1"  (1.854 m)  Wt 81.6 kg (179 lb 14.3 oz)  BMI 23.74 kg/m2  SpO2 98%  Older BM NAD No JVD Lungs clear Abd soft and nontender No edema of the LE''s Foley draining clear urine - dilute  Labs: Basic Metabolic Panel:  Recent Labs Lab 09/03/14 0046 09/03/14 0358 09/03/14 0750 09/03/14 1200 09/03/14 1723 09/04/14 0345  NA 135 134*  --  140 137 142  K 5.8* 6.5*  --  6.0* 6.1* 4.7  CL 107 109  --  112 113* 114*  CO2 16* 18*  --  17* 13* 26  GLUCOSE 51* 150*  --  168* 224* 124*  BUN 43* 44*  --  38* 36* 30*  CREATININE 3.70* 3.61*  --  3.30* 3.29* 3.17*  CALCIUM 9.9 9.2  --  9.5 8.8 9.2  PHOS  --   --  4.9*  --  4.3 4.7*   Liver Function Tests:  Recent Labs Lab 09/03/14 0046 09/03/14 1723 09/04/14 0345  AST 31  --   --   ALT 15  --   --   ALKPHOS 59  --   --   BILITOT 1.5*  --   --   PROT 8.4*  --   --   ALBUMIN 4.8 3.6 3.3*   No results for input(s): LIPASE, AMYLASE in the last 168 hours. No results for input(s): AMMONIA in the last 168 hours. CBC:  Recent Labs Lab 09/03/14 0046 09/03/14 0358  WBC 12.3* 12.4*  NEUTROABS  --  6.2  HGB 7.3* 6.9*  HCT 22.7* 21.1*  MCV 72.3* 71.0*  PLT 308 340    Recent Labs Lab 09/03/14 0750  CKTOTAL 238*  CKMB 6.7*  TROPONINI  <0.03   CBG:  Recent Labs Lab 09/03/14 1557 09/03/14 1746 09/03/14 2131 09/04/14 0849 09/04/14 1300  GLUCAP 223* 218* 205* 132* 191*   Results for [name], [name] (MRN [id number]) as of 09/04/2014 13:29  Ref. Range 09/03/2014 02:45  Color, Urine Latest Range: YELLOW  YELLOW  APPearance Latest Range: CLEAR  CLEAR  Specific Gravity, Urine Latest Range: 1.005-1.030  1.007  pH Latest Range: 5.0-8.0  5.0  Glucose Latest Range: NEGATIVE mg/dL NEGATIVE  Bilirubin Urine Latest Range: NEGATIVE  NEGATIVE  Ketones, ur Latest Range: NEGATIVE mg/dL NEGATIVE  Protein Latest Range: NEGATIVE mg/dL NEGATIVE  Urobilinogen, UA Latest Range: 0.0-1.0 mg/dL 0.2  Nitrite Latest Range: NEGATIVE  NEGATIVE  Leukocytes, UA Latest Range: NEGATIVE  NEGATIVE  Hgb urine dipstick Latest Range: NEGATIVE  NEGATIVE   Iron Studies:  Recent Labs Lab 09/03/14  1200  IRON 80  TIBC 267   Studies/Results: Dg Chest 2 View  09/03/2014   CLINICAL DATA:  Leukocytosis.  Acute encephalopathy  EXAM: CHEST  2 VIEW  COMPARISON:  04/13/2013  FINDINGS: Heart size and vascularity are normal. Lungs are clear without infiltrate or effusion.  Skeletal changes of Sickle cell  IMPRESSION: No active cardiopulmonary disease.   Electronically Signed   By: Franchot Gallo M.D.   On: 09/03/2014 07:37   Ct Head Wo Contrast  09/03/2014   CLINICAL DATA:  64 year old male with left side numbness. Lightheadedness for several days. Encephalopathy. Initial encounter.  EXAM: CT HEAD WITHOUT CONTRAST  TECHNIQUE: Contiguous axial images were obtained from the base of the skull through the vertex without intravenous contrast.  COMPARISON:  None.  FINDINGS: Visualized paranasal sinuses and mastoids are clear. No acute osseous abnormality identified. No acute orbit or scalp soft tissue findings. Postoperative changes to the right globe.  Cerebral volume is within normal limits for age. No ventriculomegaly. No midline shift, mass effect, or evidence of  intracranial mass lesion. Minimal nonspecific white matter hypodensity, otherwise normal gray-white matter differentiation. No evidence of cortically based acute infarction identified. No suspicious intracranial vascular hyperdensity. No acute intracranial hemorrhage identified.  IMPRESSION: Negative for age non contrast CT appearance of the brain.   Electronically Signed   By: Genevie Ann M.D.   On: 09/03/2014 07:17   US Renal  09/03/2014   CLINICAL DATA:  64 year old male with acute renal injury. Acute encephalopathy. Initial encounter.  EXAM: RENAL/URINARY TRACT ULTRASOUND COMPLETE  COMPARISON:  CT Abdomen and Pelvis 09/03/2003  FINDINGS: Right Kidney:  Length: 9.8 cm. Echogenic somewhat heterogeneous renal cortex (image 4). No hydronephrosis or right renal mass.  Left Kidney:  Length: 11.7 cm. Echogenic renal cortex similar to the right side. No hydronephrosis or left renal mass.  Bladder:  Appears normal for degree of bladder distention.  IMPRESSION: Chronic medical renal disease. No hydronephrosis or renal mass identified.   Electronically Signed   By: Genevie Ann M.D.   On: 09/03/2014 09:01   Medications: .  sodium bicarbonate  infusion 1000 mL 100 mL/hr at 09/04/14 0500   . amLODipine  10 mg Oral Daily  . folic acid  1 mg Oral Daily  . gabapentin  300 mg Oral TID  . insulin aspart  0-9 Units Subcutaneous Q6H  . pantoprazole  40 mg Oral Daily  . [START ON 09/05/2014] pneumococcal 23 valent vaccine  0.5 mL Intramuscular Tomorrow-1000  . sodium chloride  3 mL Intravenous Q12H  . thiamine  100 mg Oral Daily   ASSESSMENT/RECOMMENDATIONS  Acute renal failure Foley draining clear urine. Ultrasound with echogenic kidneys. Urinalysis is entirely bland. Creatinine is falling with hydration. Hyperkalemia and acidosis have resolved.  I suspect this represented volume depletion plus NSAID use. . Would continue IVF but change to 1/2 NS  (serum sodium rising a bit with isotonic fluids).  Hyperkalemia -  resolved  Metabolic acidosis- resolved. Change to 1/2 NS  Expect will continue to improve based on creatinine trend and UOP. Recommendations as above. Will follow for another day and if remains stable will sign off.  Jamal Maes, MD [place] Pager 09/04/2014, 1:37 PM

## 2014-09-04 NOTE — Progress Notes (Signed)
[facility] TEAM 1 - Stepdown/ICU TEAM Progress Note  JESSIAH HIPPLE [id number] DOB: Mar 24, 1951 DOA: 09/03/2014 PCP: Philis Fendt, MD  Admit HPI / Brief Narrative: 64 year old man with history of alcohol abuse who had been feeling unwell for 3 days with poor oral intake. He has a history of diabetes on metformin.  He lives alone although he has family in [place]. He uses daily NSAIDS for back pain. He was brought to ER by his sister and was found to have an elevated potassium.   HPI/Subjective: Pt has no new complaints this afternoon.  He denies cp, n/v, or abdom pain.    Assessment/Plan:  Acute renal failure No acute findings on renal US - crt improving w/ hydration - cont to follow trend   Hyperkalemia Resolved   Metabolic acidosis resolved  Toxic metabolic encephalopathy Resume home benzos to avoid withdrawal - mental status rapidly improving   EtOH abuse  Follow for withdrawal   Sickle cell disease - chronic anemia Hgb has declined w/ volume expansion - follow trend - no evidence of gross blood loss  Chronic LBBB  DM2 CBG variable - follow w/ SSI for now   HTN BP controlled at this time  Code Status: FULL Family Communication: no family present at time of exam Disposition Plan: transfer to tele bed   Consultants: Nephrology  Procedures: none  Antibiotics: none  DVT prophylaxis: SCDs  Objective: Blood pressure 138/70, pulse 79, temperature 98.5 F (36.9 C), temperature source Oral, resp. rate 19, height 6\' 1"  (1.854 m), weight 81.6 kg (179 lb 14.3 oz), SpO2 98 %.  Intake/Output Summary (Last 24 hours) at 09/04/14 1511 Last data filed at 09/04/14 1304  Gross per 24 hour  Intake 2396.33 ml  Output   3600 ml  Net -1203.67 ml   Exam: General: No acute respiratory distress Lungs: Clear to auscultation bilaterally without wheezes or crackles Cardiovascular: Regular rate and rhythm - soft 2/6 holosystolic M Abdomen: Nontender, nondistended,  soft, bowel sounds positive, no rebound, no ascites, no appreciable mass Extremities: No significant cyanosis, clubbing, or edema bilateral lower extremities  Data Reviewed: Basic Metabolic Panel:  Recent Labs Lab 09/03/14 0046 09/03/14 0358 09/03/14 0750 09/03/14 1200 09/03/14 1723 09/04/14 0345  NA 135 134*  --  140 137 142  K 5.8* 6.5*  --  6.0* 6.1* 4.7  CL 107 109  --  112 113* 114*  CO2 16* 18*  --  17* 13* 26  GLUCOSE 51* 150*  --  168* 224* 124*  BUN 43* 44*  --  38* 36* 30*  CREATININE 3.70* 3.61*  --  3.30* 3.29* 3.17*  CALCIUM 9.9 9.2  --  9.5 8.8 9.2  MG  --   --  2.2  --   --   --   PHOS  --   --  4.9*  --  4.3 4.7*    Liver Function Tests:  Recent Labs Lab 09/03/14 0046 09/03/14 1723 09/04/14 0345  AST 31  --   --   ALT 15  --   --   ALKPHOS 59  --   --   BILITOT 1.5*  --   --   PROT 8.4*  --   --   ALBUMIN 4.8 3.6 3.3*    Coags:  Recent Labs Lab 09/03/14 0750  INR 1.13    CBC:  Recent Labs Lab 09/03/14 0046 09/03/14 0358  WBC 12.3* 12.4*  NEUTROABS  --  6.2  HGB 7.3* 6.9*  HCT 22.7* 21.1*  MCV 72.3* 71.0*  PLT 308 340    Cardiac Enzymes:  Recent Labs Lab 09/03/14 0750  CKTOTAL 238*  CKMB 6.7*  TROPONINI <0.03    CBG:  Recent Labs Lab 09/03/14 1557 09/03/14 1746 09/03/14 2131 09/04/14 0849 09/04/14 1300  GLUCAP 223* 218* 205* 132* 191*    Recent Results (from the past 240 hour(s))  MRSA PCR Screening     Status: None   Collection Time: 09/03/14  1:26 PM  Result Value Ref Range Status   MRSA by PCR NEGATIVE NEGATIVE Final    Comment:        The GeneXpert MRSA Assay (FDA approved for NASAL specimens only), is one component of a comprehensive MRSA colonization surveillance program. It is not intended to diagnose MRSA infection nor to guide or monitor treatment for MRSA infections.      Studies:  Recent x-ray studies have been reviewed in detail by the Attending Physician  Scheduled Meds:  Scheduled  Meds: . amLODipine  10 mg Oral Daily  . folic acid  1 mg Oral Daily  . gabapentin  300 mg Oral TID  . insulin aspart  0-9 Units Subcutaneous Q6H  . pantoprazole  40 mg Oral Daily  . [START ON 09/05/2014] pneumococcal 23 valent vaccine  0.5 mL Intramuscular Tomorrow-1000  . sodium chloride  3 mL Intravenous Q12H  . thiamine  100 mg Oral Daily    Time spent on care of this patient: 35 mins   Xzaviar Maloof T , MD   [facility] Office  [phone] Pager - Text Page per Shea Evans as per below:  On-Call/Text Page:      [email or web address]      password [email or web address]  If 7PM-7AM, please contact night-coverage [email or web address] Password [email or web address] 09/04/2014, 3:11 PM   LOS: 1 day

## 2014-09-04 NOTE — Progress Notes (Signed)
Report called to 6E @ 17:30. Patient transferred safely by wheelchair after his dinner.

## 2014-09-04 NOTE — Progress Notes (Signed)
Patient arrived to [facility]. Patient oriented to room. Patient A/O and safely in bed.

## 2014-09-05 DIAGNOSIS — Z833 Family history of diabetes mellitus: Secondary | ICD-10-CM | POA: Diagnosis not present

## 2014-09-05 DIAGNOSIS — N179 Acute kidney failure, unspecified: Secondary | ICD-10-CM | POA: Diagnosis not present

## 2014-09-05 DIAGNOSIS — N183 Chronic kidney disease, stage 3 (moderate): Secondary | ICD-10-CM | POA: Diagnosis not present

## 2014-09-05 DIAGNOSIS — E869 Volume depletion, unspecified: Secondary | ICD-10-CM | POA: Diagnosis not present

## 2014-09-05 DIAGNOSIS — G9340 Encephalopathy, unspecified: Secondary | ICD-10-CM | POA: Diagnosis not present

## 2014-09-05 DIAGNOSIS — F1010 Alcohol abuse, uncomplicated: Secondary | ICD-10-CM | POA: Diagnosis not present

## 2014-09-05 DIAGNOSIS — E11649 Type 2 diabetes mellitus with hypoglycemia without coma: Secondary | ICD-10-CM | POA: Diagnosis not present

## 2014-09-05 DIAGNOSIS — F17290 Nicotine dependence, other tobacco product, uncomplicated: Secondary | ICD-10-CM | POA: Diagnosis not present

## 2014-09-05 DIAGNOSIS — Z85038 Personal history of other malignant neoplasm of large intestine: Secondary | ICD-10-CM | POA: Diagnosis not present

## 2014-09-05 DIAGNOSIS — G92 Toxic encephalopathy: Secondary | ICD-10-CM | POA: Diagnosis not present

## 2014-09-05 DIAGNOSIS — F1420 Cocaine dependence, uncomplicated: Secondary | ICD-10-CM | POA: Diagnosis not present

## 2014-09-05 DIAGNOSIS — R200 Anesthesia of skin: Secondary | ICD-10-CM | POA: Diagnosis not present

## 2014-09-05 DIAGNOSIS — Z79899 Other long term (current) drug therapy: Secondary | ICD-10-CM | POA: Diagnosis not present

## 2014-09-05 DIAGNOSIS — I129 Hypertensive chronic kidney disease with stage 1 through stage 4 chronic kidney disease, or unspecified chronic kidney disease: Secondary | ICD-10-CM | POA: Diagnosis not present

## 2014-09-05 DIAGNOSIS — F1590 Other stimulant use, unspecified, uncomplicated: Secondary | ICD-10-CM | POA: Diagnosis not present

## 2014-09-05 DIAGNOSIS — Z791 Long term (current) use of non-steroidal anti-inflammatories (NSAID): Secondary | ICD-10-CM | POA: Diagnosis not present

## 2014-09-05 DIAGNOSIS — E875 Hyperkalemia: Secondary | ICD-10-CM | POA: Diagnosis not present

## 2014-09-05 DIAGNOSIS — F1410 Cocaine abuse, uncomplicated: Secondary | ICD-10-CM | POA: Diagnosis not present

## 2014-09-05 DIAGNOSIS — E872 Acidosis: Secondary | ICD-10-CM | POA: Diagnosis not present

## 2014-09-05 DIAGNOSIS — I447 Left bundle-branch block, unspecified: Secondary | ICD-10-CM | POA: Diagnosis not present

## 2014-09-05 DIAGNOSIS — D571 Sickle-cell disease without crisis: Secondary | ICD-10-CM | POA: Diagnosis not present

## 2014-09-05 LAB — RENAL FUNCTION PANEL
ALBUMIN: 3.8 g/dL (ref 3.5–5.2)
Anion gap: 5 (ref 5–15)
BUN: 20 mg/dL (ref 6–23)
CALCIUM: 9.3 mg/dL (ref 8.4–10.5)
CHLORIDE: 109 mmol/L (ref 96–112)
CO2: 28 mmol/L (ref 19–32)
Creatinine, Ser: 2.68 mg/dL — ABNORMAL HIGH (ref 0.50–1.35)
GFR calc non Af Amer: 23 mL/min — ABNORMAL LOW (ref >90)
GFR, EST AFRICAN AMERICAN: 27 mL/min — AB (ref >90)
Glucose, Bld: 125 mg/dL — ABNORMAL HIGH (ref 70–99)
POTASSIUM: 4.3 mmol/L (ref 3.5–5.1)
Phosphorus: 3.5 mg/dL (ref 2.3–4.6)
SODIUM: 142 mmol/L (ref 135–145)

## 2014-09-05 LAB — CBC
HEMATOCRIT: 22.2 % — AB (ref 39.0–52.0)
HEMOGLOBIN: 7 g/dL — AB (ref 13.0–17.0)
MCH: 22.7 pg — ABNORMAL LOW (ref 26.0–34.0)
MCHC: 31.5 g/dL (ref 30.0–36.0)
MCV: 72.1 fL — AB (ref 78.0–100.0)
Platelets: 322 10*3/uL (ref 150–400)
RBC: 3.08 MIL/uL — AB (ref 4.22–5.81)
RDW: 18.9 % — AB (ref 11.5–15.5)
WBC: 19.9 10*3/uL — ABNORMAL HIGH (ref 4.0–10.5)

## 2014-09-05 LAB — GLUCOSE, CAPILLARY
GLUCOSE-CAPILLARY: 167 mg/dL — AB (ref 70–99)
GLUCOSE-CAPILLARY: 276 mg/dL — AB (ref 70–99)
Glucose-Capillary: 47 mg/dL — ABNORMAL LOW (ref 70–99)
Glucose-Capillary: 126 mg/dL — ABNORMAL HIGH (ref 70–99)
Glucose-Capillary: 215 mg/dL — ABNORMAL HIGH (ref 70–99)

## 2014-09-05 LAB — TROPONIN I: Troponin I: <0.03 ng/mL (ref <0.031)

## 2014-09-05 NOTE — Progress Notes (Signed)
[facility] Rounding Note Subjective:  Says his "sickle cell is acting up" joints are hurting him No SOB Good UOP  Objective Vital signs in last 24 hours: Filed Vitals:   09/04/14 1547 09/04/14 2026 09/05/14 0525 09/05/14 0803  BP: 150/71 144/79 169/90 154/98  Pulse:  78 81 80  Temp: 98.3 F (36.8 C) 98.6 F (37 C) 98.2 F (36.8 C) 98.4 F (36.9 C)  TempSrc: Oral   Oral  Resp:  19 20 19   Height:      Weight:  81.647 kg (180 lb)    SpO2:  97% 96% 100%   Weight change: 0.047 kg (1.7 oz)  Intake/Output Summary (Last 24 hours) at 09/05/14 1338 Last data filed at 09/05/14 1300  Gross per 24 hour  Intake   2320 ml  Output   3695 ml  Net  -1375 ml    Physical Exam:  BP 154/98 mmHg  Pulse 80  Temp(Src) 98.4 F (36.9 C) (Oral)  Resp 19  Ht 6\' 1"  (1.854 m)  Wt 81.647 kg (180 lb)  BMI 23.75 kg/m2  SpO2 100%  Older BM NAD No JVD Lungs clear Abd soft and nontender No edema of the LE''s Foley draining clear urine - dilute  Labs: Basic Metabolic Panel:  Recent Labs Lab 09/03/14 0046 09/03/14 0358 09/03/14 0750 09/03/14 1200 09/03/14 1723 09/04/14 0345 09/05/14 0814  NA 135 134*  --  140 137 142 142  K 5.8* 6.5*  --  6.0* 6.1* 4.7 4.3  CL 107 109  --  112 113* 114* 109  CO2 16* 18*  --  17* 13* 26 28  GLUCOSE 51* 150*  --  168* 224* 124* 125*  BUN 43* 44*  --  38* 36* 30* 20  CREATININE 3.70* 3.61*  --  3.30* 3.29* 3.17* 2.68*  CALCIUM 9.9 9.2  --  9.5 8.8 9.2 9.3  PHOS  --   --  4.9*  --  4.3 4.7* 3.5   Liver Function Tests:  Recent Labs Lab 09/03/14 0046 09/03/14 1723 09/04/14 0345 09/05/14 0814  AST 31  --   --   --   ALT 15  --   --   --   ALKPHOS 59  --   --   --   BILITOT 1.5*  --   --   --   PROT 8.4*  --   --   --   ALBUMIN 4.8 3.6 3.3* 3.8   No results for input(s): LIPASE, AMYLASE in the last 168 hours. No results for input(s): AMMONIA in the last 168 hours. CBC:  Recent Labs Lab 09/03/14 0046 09/03/14 0358  09/05/14 0814  WBC 12.3* 12.4* 19.9*  NEUTROABS  --  6.2  --   HGB 7.3* 6.9* 7.0*  HCT 22.7* 21.1* 22.2*  MCV 72.3* 71.0* 72.1*  PLT 308 340 322    Recent Labs Lab 09/03/14 0750 09/05/14 0814  CKTOTAL 238*  --   CKMB 6.7*  --   TROPONINI <0.03 <0.03   CBG:  Recent Labs Lab 09/04/14 1300 09/04/14 1542 09/04/14 2024 09/05/14 0800 09/05/14 1127  GLUCAP 191* 150* 210* 126* 215*   Results for [name], [name] (MRN [id number]) as of 09/04/2014 13:29  Ref. Range 09/03/2014 02:45  Color, Urine Latest Range: YELLOW  YELLOW  APPearance Latest Range: CLEAR  CLEAR  Specific Gravity, Urine Latest Range: 1.005-1.030  1.007  pH Latest Range: 5.0-8.0  5.0  Glucose Latest Range: NEGATIVE mg/dL NEGATIVE  Bilirubin  Urine Latest Range: NEGATIVE  NEGATIVE  Ketones, ur Latest Range: NEGATIVE mg/dL NEGATIVE  Protein Latest Range: NEGATIVE mg/dL NEGATIVE  Urobilinogen, UA Latest Range: 0.0-1.0 mg/dL 0.2  Nitrite Latest Range: NEGATIVE  NEGATIVE  Leukocytes, UA Latest Range: NEGATIVE  NEGATIVE  Hgb urine dipstick Latest Range: NEGATIVE  NEGATIVE   Iron Studies:   Recent Labs Lab 09/03/14 1200  IRON 80  TIBC 267   Studies/Results: No results found. Medications: . sodium chloride 100 mL/hr at 09/05/14 1323   . ALPRAZolam  0.5 mg Oral TID  . amLODipine  10 mg Oral Daily  . folic acid  1 mg Oral Daily  . gabapentin  100 mg Oral BID  . insulin aspart  0-9 Units Subcutaneous TID WC  . pantoprazole  40 mg Oral Daily  . sodium chloride  3 mL Intravenous Q12H  . thiamine  100 mg Oral Daily   ASSESSMENT/RECOMMENDATIONS  Acute renal failure Foley draining clear urine. Ultrasound with echogenic kidneys. Urinalysis is entirely bland. Creatinine is falling with hydration. Hyperkalemia and acidosis have resolved.  I suspect this represented volume depletion plus NSAID use. If eating and drinking OK could reduce and then stop IVF.   Hyperkalemia - resolved  Metabolic acidosis- resolved.     Expect will continue to improve based on creatinine trend and UOP. Recommendations as above. Renal will sign off at this time.  Jamal Maes, MD [place] Pager 09/05/2014, 1:38 PM

## 2014-09-05 NOTE — Progress Notes (Signed)
Progress Note  [name] [id number] DOB: [birth year] DOA: 09/03/2014 PCP: Philis Fendt, MD  Admit HPI / Brief Narrative: 64 year old man with history of alcohol abuse who had been feeling unwell for 3 days with poor oral intake. He has a history of diabetes on metformin.  He lives alone although he has family in [place]. He uses daily NSAIDS for back pain. He was brought to ER by his sister and was found to have an elevated potassium.  Placed initially in SDU and then transferred to floor 4/2.  HPI/Subjective: C/o joint pain- from sickle cell- says not chest pain Up walking in room  Assessment/Plan:  Acute renal failure No acute findings on renal US - crt improving w/ hydration - cont to follow trend  Baseline 1.4-1.6  Hyperkalemia Resolved   Metabolic acidosis resolved  Toxic metabolic encephalopathy Resume home benzos to avoid withdrawal - mental status rapidly improving   EtOH abuse  Follow for withdrawal   Sickle cell disease - chronic anemia Hgb has declined w/ volume expansion - follow trend - no evidence of gross blood loss -no CBC since 3/31- await this AMs -appears to be at baseline 6-7  Chronic LBBB  DM2 CBG variable - follow w/ SSI for now   HTN BP controlled at this time  Code Status: FULL Family Communication: no family present at time of exam Disposition Plan: transfer to tele bed   Consultants: Nephrology  Procedures: none  Antibiotics: none  DVT prophylaxis: SCDs  Objective: Blood pressure 154/98, pulse 80, temperature 98.4 F (36.9 C), temperature source Oral, resp. rate 19, height 6\' 1"  (1.854 m), weight 81.647 kg (180 lb), SpO2 100 %.  Intake/Output Summary (Last 24 hours) at 09/05/14 0807 Last data filed at 09/05/14 0600  Gross per 24 hour  Intake   2700 ml  Output   5895 ml  Net  -3195 ml   Exam: General: No acute respiratory distress Lungs: Clear to auscultation bilaterally without wheezes or  crackles Cardiovascular: Regular rate and rhythm - soft 2/6 holosystolic M Abdomen: Nontender, nondistended, soft, bowel sounds positive, no rebound, no ascites, no appreciable mass Extremities: No significant cyanosis, clubbing, or edema bilateral lower extremities  Data Reviewed: Basic Metabolic Panel:  Recent Labs Lab 09/03/14 0046 09/03/14 0358 09/03/14 0750 09/03/14 1200 09/03/14 1723 09/04/14 0345  NA 135 134*  --  140 137 142  K 5.8* 6.5*  --  6.0* 6.1* 4.7  CL 107 109  --  112 113* 114*  CO2 16* 18*  --  17* 13* 26  GLUCOSE 51* 150*  --  168* 224* 124*  BUN 43* 44*  --  38* 36* 30*  CREATININE 3.70* 3.61*  --  3.30* 3.29* 3.17*  CALCIUM 9.9 9.2  --  9.5 8.8 9.2  MG  --   --  2.2  --   --   --   PHOS  --   --  4.9*  --  4.3 4.7*    Liver Function Tests:  Recent Labs Lab 09/03/14 0046 09/03/14 1723 09/04/14 0345  AST 31  --   --   ALT 15  --   --   ALKPHOS 59  --   --   BILITOT 1.5*  --   --   PROT 8.4*  --   --   ALBUMIN 4.8 3.6 3.3*    Coags:  Recent Labs Lab 09/03/14 0750  INR 1.13    CBC:  Recent Labs Lab 09/03/14  0046 09/03/14 0358  WBC 12.3* 12.4*  NEUTROABS  --  6.2  HGB 7.3* 6.9*  HCT 22.7* 21.1*  MCV 72.3* 71.0*  PLT 308 340    Cardiac Enzymes:  Recent Labs Lab 09/03/14 0750  CKTOTAL 238*  CKMB 6.7*  TROPONINI <0.03    CBG:  Recent Labs Lab 09/03/14 2131 09/04/14 0849 09/04/14 1300 09/04/14 1542 09/04/14 2024  GLUCAP 205* 132* 191* 150* 210*    Recent Results (from the past 240 hour(s))  MRSA PCR Screening     Status: None   Collection Time: 09/03/14  1:26 PM  Result Value Ref Range Status   MRSA by PCR NEGATIVE NEGATIVE Final    Comment:        The GeneXpert MRSA Assay (FDA approved for NASAL specimens only), is one component of a comprehensive MRSA colonization surveillance program. It is not intended to diagnose MRSA infection nor to guide or monitor treatment for MRSA infections.         Scheduled Meds:  Scheduled Meds: . ALPRAZolam  0.5 mg Oral TID  . amLODipine  10 mg Oral Daily  . folic acid  1 mg Oral Daily  . gabapentin  100 mg Oral BID  . insulin aspart  0-9 Units Subcutaneous TID WC  . pantoprazole  40 mg Oral Daily  . pneumococcal 23 valent vaccine  0.5 mL Intramuscular Tomorrow-1000  . sodium chloride  3 mL Intravenous Q12H  . thiamine  100 mg Oral Daily    Time spent on care of this patient: [place], [place] , [place]  [facility] Office  [phone] Pager - Text Page per Shea Evans as per below:  On-Call/Text Page:      [email or web address]      password [email or web address]  If 7PM-7AM, please contact night-coverage [email or web address] Password [email or web address] 09/05/2014, 8:07 AM   LOS: 2 days

## 2014-09-05 NOTE — Evaluation (Signed)
Physical Therapy Evaluation Patient Details Name: [name] MRN: [id number] DOB: [birth year] Today's Date: 09/05/2014   History of Present Illness  Pt is a 64 y.o. male with PMH of HTN, DM, sickle cell anemia, CKD, cocaine abuse, alcohol use. The patient is presenting with c/o not feeling well, confusion, left-sided numbness.  Clinical Impression  Pt admitted with above diagnosis. Pt currently with functional limitations due to the deficits listed below (see PT Problem List). At the time of PT eval pt was able to perform transfers and ambulation at a supervision to min guard level. Pt is limited by joint pain from a sickle cell crisis (per pt), and feel he will be back to baseline of function when pain subsides. Pt will benefit from skilled PT to increase their independence and safety with mobility to allow discharge to the venue listed below.       Follow Up Recommendations No PT follow up    Equipment Recommendations  Cane    Recommendations for Other Services       Precautions / Restrictions Precautions Precautions: Fall Precaution Comments: Sickle cell - pt has joint pain from this which limits mobility.  Restrictions Weight Bearing Restrictions: No      Mobility  Bed Mobility Overal bed mobility: Needs Assistance Bed Mobility: Supine to Sit;Sit to Supine     Supine to sit: Supervision Sit to supine: Min guard   General bed mobility comments: Use of bed rails and increased time required. Pt was able to elevate LE's back into bed with min guard assist.   Transfers Overall transfer level: Needs assistance Equipment used: None Transfers: Sit to/from Stand Sit to Stand: Supervision         General transfer comment: Increased time to gain and maintain balance, however no physical assist was required.   Ambulation/Gait Ambulation/Gait assistance: Min guard Ambulation Distance (Feet): 425 Feet Assistive device: None (Pushed IV pole) Gait Pattern/deviations:  Step-through pattern;Decreased stride length;Trunk flexed Gait velocity: Decreased Gait velocity interpretation: Below normal speed for age/gender General Gait Details: Pt was able to ambulate while holding onto the IV pole for support. 1 standing rest break due to fatigue. Occasional min guard assist for balance and safety.   Stairs            Wheelchair Mobility    Modified Rankin (Stroke Patients Only)       Balance Overall balance assessment: Needs assistance Sitting-balance support: Feet supported;No upper extremity supported Sitting balance-Leahy Scale: Fair     Standing balance support: No upper extremity supported Standing balance-Leahy Scale: Fair Standing balance comment: Static standing activity does not require assist                             Pertinent Vitals/Pain Pain Assessment: Faces Faces Pain Scale: Hurts little more Pain Location: Joints - L side worse than R Pain Descriptors / Indicators: Aching Pain Intervention(s): Limited activity within patient's tolerance;Repositioned;Premedicated before session;Monitored during session    Home Living Family/patient expects to be discharged to:: Private residence Living Arrangements: Alone Available Help at Discharge: Family;Friend(s);Available PRN/intermittently Type of Home: House Home Access: Stairs to enter   [employer] of Steps: 1 Home Layout: One level Home Equipment: Walker - 2 wheels      Prior Function Level of Independence: Independent with assistive device(s)         Comments: Pt reports he no longer drives or does his own grocery shopping. Is not able/willing  to tell me who provides food for him.      Hand Dominance        Extremity/Trunk Assessment   Upper Extremity Assessment: Defer to OT evaluation;Overall WFL for tasks assessed           Lower Extremity Assessment: Generalized weakness (MMT not completed due to joint pain from sickle cell  crisis)      Cervical / Trunk Assessment: Normal  Communication   Communication: No difficulties  Cognition Arousal/Alertness: Awake/alert Behavior During Therapy: WFL for tasks assessed/performed Overall Cognitive Status: Within Functional Limits for tasks assessed                      General Comments      Exercises        Assessment/Plan    PT Assessment Patient needs continued PT services  PT Diagnosis Difficulty walking;Generalized weakness   PT Problem List Decreased strength;Decreased range of motion;Decreased activity tolerance;Decreased balance;Decreased mobility;Decreased knowledge of use of DME;Decreased safety awareness;Decreased knowledge of precautions;Pain  PT Treatment Interventions DME instruction;Gait training;Stair training;Functional mobility training;Therapeutic activities;Therapeutic exercise;Neuromuscular re-education;Patient/family education   PT Goals (Current goals can be found in the Care Plan section) Acute Rehab PT Goals Patient Stated Goal: Home as soon as possible PT Goal Formulation: With patient Time For Goal Achievement: 09/12/14 Potential to Achieve Goals: Good    Frequency Min 3X/week   Barriers to discharge        Co-evaluation               End of Session Equipment Utilized During Treatment: Gait belt Activity Tolerance: Patient tolerated treatment well Patient left: in bed;with call bell/phone within reach Nurse Communication: Mobility status         Time: 1332-1350 PT Time Calculation (min) (ACUTE ONLY): 18 min   Charges:   PT Evaluation $Initial PT Evaluation Tier I: 1 Procedure     PT G Codes:        Rolinda Roan 09/14/14, 2:20 PM  Rolinda Roan, PT, DPT Acute Rehabilitation Services Pager: [phone]

## 2014-09-05 NOTE — Progress Notes (Signed)
[facility] hospital[facility] progress note. Chief complaint. Chest pain. History of present illness. This 64 year old male with acute renal failure, hyperkalemia, metabolic acidosis, etc. He complained of chest pain to the nursing staff. A 12-lead EKG was obtained and this shows normal sinus rhythm with left bundle-branch block. EKG looks unchanged from previous and does not look acutely ischemic. By the time I arrived at bedside to patient was chest pain-free. Physical exam. Vital signs. Temperature 98.2, pulse 81, respiration 20, blood pressure 169/90. O2 sats 96%. General appearance. Frail elderly male who is alert and in no distress. Cardiac. Rate and rhythm regular. Lungs. Breath sounds clear. Abdomen. Soft with positive bowel sounds. Impression/plan. Problem #1. Chest pain. Patient states that he did feel chest pain earlier the central sternum. There is no radiation, diaphoresis, or nausea. Current EKG looks unchanged and nonischemic. Will obtain troponin now and then every 6 hours for a total of 3 sets. We'll repeat an EKG in 6 hours to evaluate for any changes.

## 2014-09-06 ENCOUNTER — Inpatient Hospital Stay (HOSPITAL_COMMUNITY): Payer: Medicare Other

## 2014-09-06 DIAGNOSIS — N179 Acute kidney failure, unspecified: Secondary | ICD-10-CM | POA: Diagnosis present

## 2014-09-06 DIAGNOSIS — G9340 Encephalopathy, unspecified: Secondary | ICD-10-CM | POA: Diagnosis present

## 2014-09-06 LAB — RENAL FUNCTION PANEL
ANION GAP: 5 (ref 5–15)
Albumin: 3.7 g/dL (ref 3.5–5.2)
BUN: 18 mg/dL (ref 6–23)
CHLORIDE: 106 mmol/L (ref 96–112)
CO2: 26 mmol/L (ref 19–32)
Calcium: 9.1 mg/dL (ref 8.4–10.5)
Creatinine, Ser: 2.62 mg/dL — ABNORMAL HIGH (ref 0.50–1.35)
GFR calc Af Amer: 28 mL/min — ABNORMAL LOW (ref >90)
GFR, EST NON AFRICAN AMERICAN: 24 mL/min — AB (ref >90)
GLUCOSE: 158 mg/dL — AB (ref 70–99)
Phosphorus: 2.9 mg/dL (ref 2.3–4.6)
Potassium: 3.9 mmol/L (ref 3.5–5.1)
SODIUM: 137 mmol/L (ref 135–145)

## 2014-09-06 LAB — URINALYSIS, ROUTINE W REFLEX MICROSCOPIC
BILIRUBIN URINE: NEGATIVE
Glucose, UA: 250 mg/dL — AB
Ketones, ur: NEGATIVE mg/dL
LEUKOCYTES UA: NEGATIVE
NITRITE: NEGATIVE
PROTEIN: 30 mg/dL — AB
Specific Gravity, Urine: 1.011 (ref 1.005–1.030)
Urobilinogen, UA: 1 mg/dL (ref 0.0–1.0)
pH: 5.5 (ref 5.0–8.0)

## 2014-09-06 LAB — GLUCOSE, CAPILLARY
GLUCOSE-CAPILLARY: 170 mg/dL — AB (ref 70–99)
Glucose-Capillary: 146 mg/dL — ABNORMAL HIGH (ref 70–99)
Glucose-Capillary: 149 mg/dL — ABNORMAL HIGH (ref 70–99)
Glucose-Capillary: 201 mg/dL — ABNORMAL HIGH (ref 70–99)

## 2014-09-06 LAB — URINE MICROSCOPIC-ADD ON

## 2014-09-06 MED ORDER — ALPRAZOLAM 0.5 MG PO TABS
0.5000 mg | ORAL_TABLET | Freq: Two times a day (BID) | ORAL | Status: DC
  Administered 2014-09-06: 0.5 mg via ORAL
  Filled 2014-09-06: qty 1

## 2014-09-06 NOTE — Progress Notes (Signed)
Lost pt's IV site. IV team attempted twice to get a new IV site without success. Can leave IV out per Dr. Conley Canal.

## 2014-09-06 NOTE — Progress Notes (Signed)
Progress Note  [name] [id number] DOB: 22-Apr-1951 DOA: 09/03/2014 PCP: Philis Fendt, MD  Admit HPI / Brief Narrative: 64 year old man with history of alcohol abuse who had been feeling unwell for 3 days with poor oral intake. He has a history of diabetes on metformin.  He lives alone although he has family in [place]. He uses daily NSAIDS for back pain. He was brought to ER by his sister and was found to have an elevated potassium.  Placed initially in SDU and then transferred to floor 4/2.  HPI/Subjective: C/p right hand pain  Assessment/Plan:  Acute renal failure Improving. Continue ivf today, and hopefully home tomorrow  Hyperkalemia Resolved   Metabolic acidosis resolved  Toxic metabolic encephalopathy Sleepy today. Change ativan to bid  EtOH abuse  Follow for withdrawal   Sickle cell disease - chronic anemia Hgb has declined w/ volume expansion - follow trend - no evidence of gross blood loss -no CBC since 3/31- await this AMs -appears to be at baseline 6-7  Chronic LBBB  DM2 CBG variable - follow w/ SSI for now   HTN BP controlled at this time  Code Status: FULL Family Communication: no family present at time of exam Disposition Plan:   Consultants: Nephrology  Procedures: none  Antibiotics: none  DVT prophylaxis: SCDs  Objective: Blood pressure 158/77, pulse 90, temperature 98.1 F (36.7 C), temperature source Oral, resp. rate 18, height 6\' 1"  (1.854 m), weight 83.008 kg (183 lb), SpO2 92 %.  Intake/Output Summary (Last 24 hours) at 09/06/14 1548 Last data filed at 09/06/14 1358  Gross per 24 hour  Intake   1820 ml  Output   2850 ml  Net  -1030 ml   Exam: General: asleep. Arousable, falls back asleep Lungs: Clear to auscultation bilaterally without wheezes or crackles Cardiovascular: Regular rate and rhythm - soft 2/6 holosystolic M Abdomen: Nontender, nondistended, soft, bowel sounds positive, no rebound, no ascites, no  appreciable mass Extremities: No significant cyanosis, clubbing, or edema bilateral lower extremities  Data Reviewed: Basic Metabolic Panel:  Recent Labs Lab 09/03/14 0750 09/03/14 1200 09/03/14 1723 09/04/14 0345 09/05/14 0814 09/06/14 0725  NA  --  140 137 142 142 137  K  --  6.0* 6.1* 4.7 4.3 3.9  CL  --  112 113* 114* 109 106  CO2  --  17* 13* 26 28 26   GLUCOSE  --  168* 224* 124* 125* 158*  BUN  --  38* 36* 30* 20 18  CREATININE  --  3.30* 3.29* 3.17* 2.68* 2.62*  CALCIUM  --  9.5 8.8 9.2 9.3 9.1  MG 2.2  --   --   --   --   --   PHOS 4.9*  --  4.3 4.7* 3.5 2.9    Liver Function Tests:  Recent Labs Lab 09/03/14 0046 09/03/14 1723 09/04/14 0345 09/05/14 0814 09/06/14 0725  AST 31  --   --   --   --   ALT 15  --   --   --   --   ALKPHOS 59  --   --   --   --   BILITOT 1.5*  --   --   --   --   PROT 8.4*  --   --   --   --   ALBUMIN 4.8 3.6 3.3* 3.8 3.7    Coags:  Recent Labs Lab 09/03/14 0750  INR 1.13    CBC:  Recent Labs  Lab 09/03/14 0046 09/03/14 0358 09/03/14 1200 09/05/14 0814  WBC 12.3* 12.4*  --  19.9*  NEUTROABS  --  6.2  --   --   HGB 7.3* 6.9*  --  7.0*  HCT 22.7* 21.1* 21.9* 22.2*  MCV 72.3* 71.0*  --  72.1*  PLT 308 340  --  322    Cardiac Enzymes:  Recent Labs Lab 09/03/14 0750 09/05/14 0814 09/05/14 1418 09/05/14 1820  CKTOTAL 238*  --   --   --   CKMB 6.7*  --   --   --   TROPONINI <0.03 <0.03 <0.03 <0.03    CBG:  Recent Labs Lab 09/05/14 1127 09/05/14 1612 09/05/14 1947 09/06/14 0744 09/06/14 1131  GLUCAP 215* 167* 276* 146* 170*    Recent Results (from the past 240 hour(s))  MRSA PCR Screening     Status: None   Collection Time: 09/03/14  1:26 PM  Result Value Ref Range Status   MRSA by PCR NEGATIVE NEGATIVE Final    Comment:        The GeneXpert MRSA Assay (FDA approved for NASAL specimens only), is one component of a comprehensive MRSA colonization surveillance program. It is not intended to  diagnose MRSA infection nor to guide or monitor treatment for MRSA infections.        Scheduled Meds:  Scheduled Meds: . ALPRAZolam  0.5 mg Oral TID  . amLODipine  10 mg Oral Daily  . folic acid  1 mg Oral Daily  . gabapentin  100 mg Oral BID  . insulin aspart  0-9 Units Subcutaneous TID WC  . pantoprazole  40 mg Oral Daily  . sodium chloride  3 mL Intravenous Q12H  . thiamine  100 mg Oral Daily    Time spent on care of this patient: 25 mins   Delfina Redwood ,    [facility] Office  [phone]  [email or web address] Password [email or web address] 09/06/2014, 3:48 PM   LOS: 3 days

## 2014-09-06 NOTE — Evaluation (Signed)
Occupational Therapy Evaluation Patient Details Name: [name] MRN: [id number] DOB: 08/26/1950 Today's Date: 09/06/2014    History of Present Illness Pt is a 64 y.o. male with PMH of HTN, DM, sickle cell anemia, CKD, cocaine abuse, alcohol use. The patient is presenting with c/o not feeling well, confusion, left-sided numbness.   Clinical Impression   PTA pt lived at home and was independent with ADLs. He reports that he has assistance with grocery shopping and car rides. Pt currently at Supervision level for ADLs and educated on fall prevention and energy conservation. No further acute OT needs.     Follow Up Recommendations  No OT follow up;Supervision/Assistance - 24 hour    Equipment Recommendations  None recommended by OT    Recommendations for Other Services       Precautions / Restrictions Precautions Precautions: Fall Precaution Comments: Sickle cell - pt has joint pain from this which limits mobility.  Restrictions Weight Bearing Restrictions: No      Mobility Bed Mobility Overal bed mobility: Modified Independent             General bed mobility comments: increased time  Transfers Overall transfer level: Needs assistance Equipment used: None Transfers: Sit to/from Stand Sit to Stand: Supervision         General transfer comment: Increased time to gain and maintain balance, however no physical assist was required.          ADL Overall ADL's : Needs assistance/impaired                                       General ADL Comments: Pt requires supervision for ADLs and functional mobility at this time. Educated pt on safety with ADLs and fall prevention.      Vision Vision Assessment?: No apparent visual deficits          Pertinent Vitals/Pain Pain Assessment: No/denies pain     Hand Dominance Right   Extremity/Trunk Assessment Upper Extremity Assessment Upper Extremity Assessment: Overall WFL for tasks assessed (mild  edema in RUE)   Lower Extremity Assessment Lower Extremity Assessment: Defer to PT evaluation   Cervical / Trunk Assessment Cervical / Trunk Assessment: Normal   Communication Communication Communication: No difficulties   Cognition Arousal/Alertness: Awake/alert Behavior During Therapy: WFL for tasks assessed/performed Overall Cognitive Status: Within Functional Limits for tasks assessed                                Home Living Family/patient expects to be discharged to:: Private residence Living Arrangements: Alone Available Help at Discharge: Family;Friend(s);Available PRN/intermittently Type of Home: House Home Access: Stairs to enter [employer] of Steps: 1   Home Layout: One level     Bathroom Shower/Tub: Tub/shower unit Shower/tub characteristics: Curtain Biochemist, clinical: Standard     Home Equipment: Environmental consultant - 2 wheels          Prior Functioning/Environment Level of Independence: Independent        Comments: Pt reports he does not drive. He and neighbors "look out for each other."    OT Diagnosis: Generalized weakness    End of Session  Activity Tolerance: Patient tolerated treatment well Patient left: Other (comment);with call bell/phone within reach (sitting EOB with breakfast)   Time: [id number] OT Time Calculation (min): 12 min Charges:  OT General Charges $OT Visit:  1 Procedure OT Evaluation $Initial OT Evaluation Tier I: 1 Procedure G-Codes:    Juluis Rainier September 28, 2014, 8:19 AM  Cyndie Chime, OTR/L Occupational Therapist [phone] (pager)

## 2014-09-07 DIAGNOSIS — R509 Fever, unspecified: Secondary | ICD-10-CM

## 2014-09-07 DIAGNOSIS — D72829 Elevated white blood cell count, unspecified: Secondary | ICD-10-CM

## 2014-09-07 LAB — CBC
HCT: 18.5 % — ABNORMAL LOW (ref 39.0–52.0)
Hemoglobin: 6 g/dL — CL (ref 13.0–17.0)
MCH: 23.3 pg — ABNORMAL LOW (ref 26.0–34.0)
MCHC: 32.4 g/dL (ref 30.0–36.0)
MCV: 71.7 fL — ABNORMAL LOW (ref 78.0–100.0)
Platelets: 270 10*3/uL (ref 150–400)
RBC: 2.58 MIL/uL — ABNORMAL LOW (ref 4.22–5.81)
RDW: 17.3 % — AB (ref 11.5–15.5)
WBC: 24.6 10*3/uL — AB (ref 4.0–10.5)

## 2014-09-07 LAB — FOLATE RBC
FOLATE, RBC: 1786 ng/mL (ref >498)
Folate, Hemolysate: 391.1 ng/mL
Hematocrit: 21.9 % — ABNORMAL LOW (ref 37.5–51.0)

## 2014-09-07 LAB — BASIC METABOLIC PANEL
Anion gap: 5 (ref 5–15)
BUN: 18 mg/dL (ref 6–23)
CO2: 24 mmol/L (ref 19–32)
Calcium: 8.8 mg/dL (ref 8.4–10.5)
Chloride: 108 mmol/L (ref 96–112)
Creatinine, Ser: 2.62 mg/dL — ABNORMAL HIGH (ref 0.50–1.35)
GFR, EST AFRICAN AMERICAN: 28 mL/min — AB (ref >90)
GFR, EST NON AFRICAN AMERICAN: 24 mL/min — AB (ref >90)
GLUCOSE: 192 mg/dL — AB (ref 70–99)
Potassium: 3.9 mmol/L (ref 3.5–5.1)
SODIUM: 137 mmol/L (ref 135–145)

## 2014-09-07 LAB — GLUCOSE, CAPILLARY
GLUCOSE-CAPILLARY: 196 mg/dL — AB (ref 70–99)
GLUCOSE-CAPILLARY: 190 mg/dL — AB (ref 70–99)
Glucose-Capillary: 226 mg/dL — ABNORMAL HIGH (ref 70–99)
Glucose-Capillary: 161 mg/dL — ABNORMAL HIGH (ref 70–99)

## 2014-09-07 LAB — INFLUENZA PANEL BY PCR (TYPE A & B)
H1N1FLUPCR: NOT DETECTED
INFLBPCR: NEGATIVE
Influenza A By PCR: NEGATIVE

## 2014-09-07 MED ORDER — ALPRAZOLAM 0.5 MG PO TABS
0.5000 mg | ORAL_TABLET | Freq: Every day | ORAL | Status: DC
  Administered 2014-09-07 – 2014-09-08 (×2): 0.5 mg via ORAL
  Filled 2014-09-07 (×2): qty 1

## 2014-09-07 MED ORDER — LEVOFLOXACIN 500 MG PO TABS: 500.0000 mg | ORAL_TABLET | ORAL | Status: DC

## 2014-09-07 MED ORDER — LEVOFLOXACIN 750 MG PO TABS
750.0000 mg | ORAL_TABLET | Freq: Once | ORAL | Status: AC
  Administered 2014-09-07: 750 mg via ORAL
  Filled 2014-09-07: qty 1

## 2014-09-07 NOTE — Progress Notes (Signed)
Removed Foley catheter, instructed pt to call when urinating in urinal to record output.

## 2014-09-07 NOTE — Progress Notes (Signed)
Physical Therapy Treatment Patient Details Name: [name] MRN: [id number] DOB: Mar 27, 1951 Today's Date: 09/07/2014    History of Present Illness Pt is a 64 y.o. male with PMH of HTN, DM, sickle cell anemia, CKD, cocaine abuse, alcohol use. The patient is presenting with c/o not feeling well, confusion, left-sided numbness.    PT Comments    Participating very well, especially given pt doesn't feel very well, and his Hgb is 6 (spoke with RN, there is no plan to transfuse); Continue to recommend cane for extra support with amb  Will continue to follow; he will likely meet PT goals next session   Follow Up Recommendations  No PT follow up     Equipment Recommendations  Cane    Recommendations for Other Services       Precautions / Restrictions Precautions Precautions: Fall Precaution Comments: Sickle cell - pt has joint pain from this which limits mobility.  Restrictions Weight Bearing Restrictions: No    Mobility  Bed Mobility Overal bed mobility: Modified Independent             General bed mobility comments: increased time  Transfers Overall transfer level: Needs assistance Equipment used: None Transfers: Sit to/from Stand Sit to Stand: Supervision         General transfer comment: Increased time to gain and maintain balance, however no physical assist was required.   Ambulation/Gait Ambulation/Gait assistance: Supervision Ambulation Distance (Feet): 225 Feet Assistive device:  (use of hallway rail, more than 50% of amb)   Gait velocity: Decreased   General Gait Details: more painful today, and noted more need for use of hallway rail   Stairs            Wheelchair Mobility    Modified Rankin (Stroke Patients Only)       Balance             Standing balance-Leahy Scale: Fair                      Cognition Arousal/Alertness: Awake/alert Behavior During Therapy: WFL for tasks assessed/performed Overall Cognitive  Status: Within Functional Limits for tasks assessed                      Exercises      General Comments General comments (skin integrity, edema, etc.): Session conducted on Room Air, and O2 sats ranged from 87% to 92% during walk ; noted HR as high as 124; restarted O2 2L once seated, and sats inr to 94%, HR 114 after seated rest      Pertinent Vitals/Pain Pain Assessment: Faces Faces Pain Scale: Hurts little more Pain Location: Joint pain with getting up and walking; states it is similar to when he is beginning to be in sickle cell crisis Pain Descriptors / Indicators: Aching Pain Intervention(s): Limited activity within patient's tolerance;Monitored during session    Home Living                      Prior Function            PT Goals (current goals can now be found in the care plan section) Acute Rehab PT Goals Patient Stated Goal: Home as soon as possible PT Goal Formulation: With patient Time For Goal Achievement: 09/12/14 Potential to Achieve Goals: Good Progress towards PT goals: Progressing toward goals    Frequency  Min 3X/week    PT Plan Current plan remains appropriate  Co-evaluation             End of Session Equipment Utilized During Treatment: Gait belt Activity Tolerance: Patient tolerated treatment well Patient left: in chair;with call bell/phone within reach     Time: 1031-1049 PT Time Calculation (min) (ACUTE ONLY): 18 min  Charges:  $Gait Training: 8-22 mins                    G Codes:      [name] Hamff 09/07/2014, 11:00 AM  [name], [name] Pager [phone] Office [phone]

## 2014-09-07 NOTE — Progress Notes (Signed)
Pt has critical hemoglobin 6.0, pt does not have IV access. IV team unable to obtain one, paged Dr. Tollie Pizza

## 2014-09-07 NOTE — Progress Notes (Signed)
At 2101, patient's temperature is 102.7.  He is complaining of sore throat and productive cough with small amount of white secretions.  His eyes are red and watery.  MD called an made aware.  Patient placed on Droplet Precautions and influenza pcr obtained and sent to the laboratory.  Patient education done with patient and daughter and questions answered.  BC x 2 drawn.  Portable chest xray obtained and results called the [facility].  No new orders received.  Will continue to monitor patient.  [employer] RN-BC, WTA.

## 2014-09-07 NOTE — Progress Notes (Signed)
Progress Note  ARBOR BOLOSAN [id number] DOB: 07-28-1950 DOA: 09/03/2014 PCP: Philis Fendt, MD  Admit HPI / Brief Narrative: 64 year old man with history of alcohol abuse who had been feeling unwell for 3 days with poor oral intake. He has a history of diabetes on metformin.  He lives alone although he has family in [place]. He uses daily NSAIDS for back pain. He was brought to ER by his sister and was found to have an elevated potassium.  Placed initially in SDU and then transferred to floor 4/2.  Assessment/Plan:  Fever: Had a temperature of 102.7 overnight. Blood pressure and heart rate normal. Flu swab negative. Repeat urinalysis unremarkable. Repeat chest x-ray negative. Blood cultures drawn and are pending. Appetite is good. No abdominal pain.  Will check respiratory virus panel. May be viral fever. Also could be acute bronchitis. Will start levofloxacin. White blood cell count is 21,000, but in reviewing records, patient has a chronic intermittent leukocytosis so unclear what the clinical significance of this is. Patient does not appear septic at this time. Will monitor vital signs carefully as well as CBC with differential. Not ready for discharge.  Leukocytosis: See above. Appears chronic.  Acute renal failure Creatinine essentially unchanged from yesterday. Lost IV access yesterday. Eating well and drinking plenty of fluids. Hold off on central line for now.  Hyperkalemia Resolved   Metabolic acidosis resolved  Toxic metabolic encephalopathy Less sleepy today. Patient was asleep when I walked in however. Will change Ativan to daily at bedtime.  Polysubstance abuse: No evidence of alcohol withdrawal.  Sickle cell disease - chronic anemia Hemoglobin 6.0. Asymptomatic. Would not transfuse at this point. Baseline usually ranges 6-8. Monitor.  Denies pain crisis symptoms. Check an LDH and reticulocyte count tomorrow.  Chronic LBBB  DM2 CBG variable - follow w/ SSI  for now   HTN BP controlled at this time  Code Status: FULL Family Communication: no family present at time of exam Disposition Plan:   Consultants: Nephrology  Procedures: none  Antibiotics: none  DVT prophylaxis: SCDs  HPI/Subjective: Felt cold last night. Developed a nonproductive cough. No vomiting diarrhea shortness of breath. No sickle cell pain. No sore throat. No myalgias. No sinus congestion.  Objective: Blood pressure 165/86, pulse 91, temperature 100.3 F (37.9 C), temperature source Oral, resp. rate 18, height 6\' 1"  (1.854 m), weight 83.008 kg (183 lb), SpO2 98 %.  Intake/Output Summary (Last 24 hours) at 09/07/14 0957 Last data filed at 09/07/14 0945  Gross per 24 hour  Intake   1700 ml  Output   2775 ml  Net  -1075 ml   Exam: General: asleep. Arousable, more interactive. Occasional dry cough. Lungs: Clear to auscultation bilaterally without wheezes or crackles Cardiovascular: Regular rate and rhythm - soft 2/6 holosystolic M Abdomen: Nontender, nondistended, soft, bowel sounds positive, no rebound, no ascites, no appreciable mass Extremities: No significant cyanosis, clubbing, or edema bilateral lower extremities  Data Reviewed: Basic Metabolic Panel:  Recent Labs Lab 09/03/14 0750  09/03/14 1723 09/04/14 0345 09/05/14 0814 09/06/14 0725 09/07/14 0640  NA  --   < > 137 142 142 137 137  K  --   < > 6.1* 4.7 4.3 3.9 3.9  CL  --   < > 113* 114* 109 106 108  CO2  --   < > 13* 26 28 26 24   GLUCOSE  --   < > 224* 124* 125* 158* 192*  BUN  --   < >  36* 30* 20 18 18   CREATININE  --   < > 3.29* 3.17* 2.68* 2.62* 2.62*  CALCIUM  --   < > 8.8 9.2 9.3 9.1 8.8  MG 2.2  --   --   --   --   --   --   PHOS 4.9*  --  4.3 4.7* 3.5 2.9  --   < > = values in this interval not displayed.  Liver Function Tests:  Recent Labs Lab 09/03/14 0046 09/03/14 1723 09/04/14 0345 09/05/14 0814 09/06/14 0725  AST 31  --   --   --   --   ALT 15  --   --   --   --    ALKPHOS 59  --   --   --   --   BILITOT 1.5*  --   --   --   --   PROT 8.4*  --   --   --   --   ALBUMIN 4.8 3.6 3.3* 3.8 3.7    Coags:  Recent Labs Lab 09/03/14 0750  INR 1.13    CBC:  Recent Labs Lab 09/03/14 0046 09/03/14 0358 09/03/14 1200 09/05/14 0814 09/07/14 0640  WBC 12.3* 12.4*  --  19.9* 24.6*  NEUTROABS  --  6.2  --   --   --   HGB 7.3* 6.9*  --  7.0* 6.0*  HCT 22.7* 21.1* 21.9* 22.2* 18.5*  MCV 72.3* 71.0*  --  72.1* 71.7*  PLT 308 340  --  322 270    Cardiac Enzymes:  Recent Labs Lab 09/03/14 0750 09/05/14 0814 09/05/14 1418 09/05/14 1820  CKTOTAL 238*  --   --   --   CKMB 6.7*  --   --   --   TROPONINI <0.03 <0.03 <0.03 <0.03    CBG:  Recent Labs Lab 09/06/14 0744 09/06/14 1131 09/06/14 1623 09/06/14 2052 09/07/14 0748  GLUCAP 146* 170* 149* 201* 196*    Recent Results (from the past 240 hour(s))  MRSA PCR Screening     Status: None   Collection Time: 09/03/14  1:26 PM  Result Value Ref Range Status   MRSA by PCR NEGATIVE NEGATIVE Final    Comment:        The GeneXpert MRSA Assay (FDA approved for NASAL specimens only), is one component of a comprehensive MRSA colonization surveillance program. It is not intended to diagnose MRSA infection nor to guide or monitor treatment for MRSA infections.        Scheduled Meds:  Scheduled Meds: . ALPRAZolam  0.5 mg Oral BID  . amLODipine  10 mg Oral Daily  . folic acid  1 mg Oral Daily  . gabapentin  100 mg Oral BID  . insulin aspart  0-9 Units Subcutaneous TID WC  . [START ON 09/09/2014] levofloxacin  500 mg Oral Q48H  . levofloxacin  750 mg Oral Once  . pantoprazole  40 mg Oral Daily  . sodium chloride  3 mL Intravenous Q12H  . thiamine  100 mg Oral Daily   Time spent on care of this patient: 35 mins   Kaianna Dolezal L ,  [facility]  [email or web address] Password [email or web address] 09/07/2014, 9:57 AM   LOS: 4 days

## 2014-09-08 LAB — BASIC METABOLIC PANEL
ANION GAP: 6 (ref 5–15)
BUN: 17 mg/dL (ref 6–23)
CHLORIDE: 103 mmol/L (ref 96–112)
CO2: 24 mmol/L (ref 19–32)
Calcium: 9.1 mg/dL (ref 8.4–10.5)
Creatinine, Ser: 2.65 mg/dL — ABNORMAL HIGH (ref 0.50–1.35)
GFR, EST AFRICAN AMERICAN: 27 mL/min — AB (ref >90)
GFR, EST NON AFRICAN AMERICAN: 24 mL/min — AB (ref >90)
Glucose, Bld: 150 mg/dL — ABNORMAL HIGH (ref 70–99)
POTASSIUM: 3.5 mmol/L (ref 3.5–5.1)
Sodium: 133 mmol/L — ABNORMAL LOW (ref 135–145)

## 2014-09-08 LAB — HEMOGLOBIN AND HEMATOCRIT, BLOOD
HCT: 16.9 % — ABNORMAL LOW (ref 39.0–52.0)
Hemoglobin: 5.5 g/dL — CL (ref 13.0–17.0)

## 2014-09-08 LAB — CBC WITH DIFFERENTIAL/PLATELET
BASOS PCT: 0 % (ref 0–1)
Basophils Absolute: 0 10*3/uL (ref 0.0–0.1)
EOS ABS: 0.8 10*3/uL — AB (ref 0.0–0.7)
EOS PCT: 4 % (ref 0–5)
HCT: 17.2 % — ABNORMAL LOW (ref 39.0–52.0)
HEMOGLOBIN: 5.6 g/dL — AB (ref 13.0–17.0)
LYMPHS ABS: 2.5 10*3/uL (ref 0.7–4.0)
Lymphocytes Relative: 12 % (ref 12–46)
MCH: 23.2 pg — ABNORMAL LOW (ref 26.0–34.0)
MCHC: 32.6 g/dL (ref 30.0–36.0)
MCV: 71.4 fL — AB (ref 78.0–100.0)
MONO ABS: 2.3 10*3/uL — AB (ref 0.1–1.0)
MONOS PCT: 11 % (ref 3–12)
NEUTROS ABS: 15.1 10*3/uL — AB (ref 1.7–7.7)
Neutrophils Relative %: 73 % (ref 43–77)
PLATELETS: 311 10*3/uL (ref 150–400)
RBC: 2.41 MIL/uL — AB (ref 4.22–5.81)
RDW: 16.4 % — ABNORMAL HIGH (ref 11.5–15.5)
WBC: 20.7 10*3/uL — AB (ref 4.0–10.5)

## 2014-09-08 LAB — URINE CULTURE
COLONY COUNT: NO GROWTH
CULTURE: NO GROWTH

## 2014-09-08 LAB — RETICULOCYTES
RBC.: 2.41 MIL/uL — ABNORMAL LOW (ref 4.22–5.81)
Retic Count, Absolute: 229 10*3/uL — ABNORMAL HIGH (ref 19.0–186.0)
Retic Ct Pct: 9.5 % — ABNORMAL HIGH (ref 0.4–3.1)

## 2014-09-08 LAB — GLUCOSE, CAPILLARY
GLUCOSE-CAPILLARY: 145 mg/dL — AB (ref 70–99)
GLUCOSE-CAPILLARY: 249 mg/dL — AB (ref 70–99)
GLUCOSE-CAPILLARY: 251 mg/dL — AB (ref 70–99)
Glucose-Capillary: 190 mg/dL — ABNORMAL HIGH (ref 70–99)

## 2014-09-08 LAB — LACTATE DEHYDROGENASE: LDH: 235 U/L (ref 94–250)

## 2014-09-08 LAB — PREPARE RBC (CROSSMATCH)

## 2014-09-08 MED ORDER — LEVOFLOXACIN 750 MG PO TABS
750.0000 mg | ORAL_TABLET | ORAL | Status: DC
  Administered 2014-09-09: 750 mg via ORAL
  Filled 2014-09-08: qty 1

## 2014-09-08 MED ORDER — SODIUM CHLORIDE 0.9 % IJ SOLN: 10.0000 mL | INTRAMUSCULAR | Status: DC | PRN

## 2014-09-08 MED ORDER — SODIUM CHLORIDE 0.9 % IV SOLN
Freq: Once | INTRAVENOUS | Status: AC
  Administered 2014-09-08: 16:00:00 via INTRAVENOUS

## 2014-09-08 MED ORDER — VITAMIN B-12 1000 MCG PO TABS
1000.0000 ug | ORAL_TABLET | Freq: Every day | ORAL | Status: DC
  Administered 2014-09-08 – 2014-09-09 (×2): 1000 ug via ORAL
  Filled 2014-09-08 (×2): qty 1

## 2014-09-08 NOTE — Progress Notes (Signed)
1 unit PRBC's began VSS.Temp  98.9, hr 82 bp 142/76 O2 sat 94%.  Denies  Pain. Pleasant.

## 2014-09-08 NOTE — Progress Notes (Signed)
Progress Note  [name] [id number] DOB: 27-Aug-1950 DOA: 09/03/2014 PCP: Philis Fendt, MD  Admit HPI / Brief Narrative: 64 year old man with history of alcohol abuse who had been feeling unwell for 3 days with poor oral intake. He has a history of diabetes on metformin.  He lives alone although he has family in [place]. He uses daily NSAIDS for back pain. He was brought to ER by his sister and was found to have an elevated potassium.  Placed initially in SDU and then transferred to floor 4/2.  Assessment/Plan:  Fever: Continues to have intermittent fevers. RSV panel pending. On levaquin in case etiology is acute bronchitis  Leukocytosis: See above. Appears chronic.  Acute renal failure Creatinine stable for 3 days yesterday. Lost IV access 2 days ago. Now that hgb is 5.5, needs PICC and transfusion  Hyperkalemia Resolved   Metabolic acidosis resolved  Toxic metabolic encephalopathy Now at baseline  Polysubstance abuse: etoh and cocaine No evidence of alcohol withdrawal.  Sickle cell disease - chronic anemia Hemoglobin 5.5 (repeated) Baseline usually ranges 6-8. Monitor.  Denies pain crisis symptoms. LDH ok. No evidence of crisis. Needs transfusion. Will order 1 unit. Needs picc. B12 borderline. Will add po repletion  Chronic LBBB  DM2 CBG variable - follow w/ SSI for now   HTN BP controlled at this time  Code Status: FULL Family Communication: no family present at time of exam Disposition Plan:   Consultants: Nephrology  Procedures: none  Antibiotics: none  DVT prophylaxis: SCDs  HPI/Subjective: Feels a little better. Still with cough. No significant pain, dyspnea. No bleeding  Objective: Blood pressure 147/79, pulse 85, temperature 98.8 F (37.1 C), temperature source Oral, resp. rate 18, height 6\' 1"  (1.854 m), weight 83.643 kg (184 lb 6.4 oz), SpO2 95 %.  Intake/Output Summary (Last 24 hours) at 09/08/14 1252 Last data filed at  09/08/14 0907  Gross per 24 hour  Intake    870 ml  Output   2225 ml  Net  -1355 ml   Exam: General: asleep. Arousable Occasional dry cough. Lungs: Clear to auscultation bilaterally without wheezes or crackles Cardiovascular: Regular rate and rhythm - soft 2/6 holosystolic M Abdomen: Nontender, nondistended, soft, bowel sounds positive, no rebound, no ascites, no appreciable mass Extremities: No significant cyanosis, clubbing, or edema bilateral lower extremities  Data Reviewed: Basic Metabolic Panel:  Recent Labs Lab 09/03/14 0750  09/03/14 1723 09/04/14 0345 09/05/14 [id number] 09/06/14 0725 09/07/14 0640 09/08/14 0602  NA  --   < > 137 142 142 137 137 133*  K  --   < > 6.1* 4.7 4.3 3.9 3.9 3.5  CL  --   < > 113* 114* 109 106 108 103  CO2  --   < > 13* 26 28 26 24 24   GLUCOSE  --   < > 224* 124* 125* 158* 192* 150*  BUN  --   < > 36* 30* 20 18 18 17   CREATININE  --   < > 3.29* 3.17* 2.68* 2.62* 2.62* 2.65*  CALCIUM  --   < > 8.8 9.2 9.3 9.1 8.8 9.1  MG 2.2  --   --   --   --   --   --   --   PHOS 4.9*  --  4.3 4.7* 3.5 2.9  --   --   < > = values in this interval not displayed.  Liver Function Tests:  Recent Labs Lab 09/03/14 0046 09/03/14 1723  09/04/14 0345 09/05/14 0814 09/06/14 0725  AST 31  --   --   --   --   ALT 15  --   --   --   --   ALKPHOS 59  --   --   --   --   BILITOT 1.5*  --   --   --   --   PROT 8.4*  --   --   --   --   ALBUMIN 4.8 3.6 3.3* 3.8 3.7    Coags:  Recent Labs Lab 09/03/14 0750  INR 1.13    CBC:  Recent Labs Lab 09/03/14 0046 09/03/14 0358 09/03/14 1200 09/05/14 0814 09/07/14 0640 09/08/14 0602 09/08/14 1045  WBC 12.3* 12.4*  --  19.9* 24.6* 20.7*  --   NEUTROABS  --  6.2  --   --   --  15.1*  --   HGB 7.3* 6.9*  --  7.0* 6.0* 5.6* 5.5*  HCT 22.7* 21.1* 21.9* 22.2* 18.5* 17.2* 16.9*  MCV 72.3* 71.0*  --  72.1* 71.7* 71.4*  --   PLT 308 340  --  322 270 311  --     Cardiac Enzymes:  Recent Labs Lab  09/03/14 0750 09/05/14 0814 09/05/14 1418 09/05/14 1820  CKTOTAL 238*  --   --   --   CKMB 6.7*  --   --   --   TROPONINI <0.03 <0.03 <0.03 <0.03    CBG:  Recent Labs Lab 09/07/14 1127 09/07/14 1634 09/07/14 2129 09/08/14 0741 09/08/14 1149  GLUCAP 226* 190* 161* 145* 190*    Recent Results (from the past 240 hour(s))  MRSA PCR Screening     Status: None   Collection Time: 09/03/14  1:26 PM  Result Value Ref Range Status   MRSA by PCR NEGATIVE NEGATIVE Final    Comment:        The GeneXpert MRSA Assay (FDA approved for NASAL specimens only), is one component of a comprehensive MRSA colonization surveillance program. It is not intended to diagnose MRSA infection nor to guide or monitor treatment for MRSA infections.   Culture, blood (routine x 2)     Status: None (Preliminary result)   Collection Time: 09/06/14  9:59 PM  Result Value Ref Range Status   Specimen Description BLOOD RIGHT HAND  Final   Special Requests BOTTLES DRAWN AEROBIC ONLY 3CC  Final   Culture   Final           BLOOD CULTURE RECEIVED NO GROWTH TO DATE CULTURE WILL BE HELD FOR 5 DAYS BEFORE ISSUING A FINAL NEGATIVE REPORT Performed at [facility]    Report Status PENDING  Incomplete  Urine culture     Status: None   Collection Time: 09/06/14 10:03 PM  Result Value Ref Range Status   Specimen Description URINE, RANDOM  Final   Special Requests NONE  Final   Colony Count NO GROWTH Performed at [facility]   Final   Culture NO GROWTH Performed at [facility]   Final   Report Status 09/08/2014 FINAL  Final  Culture, blood (routine x 2)     Status: None (Preliminary result)   Collection Time: 09/06/14 10:11 PM  Result Value Ref Range Status   Specimen Description BLOOD RIGHT WRIST  Final   Special Requests BOTTLES DRAWN AEROBIC ONLY 1CC  Final   Culture   Final           BLOOD CULTURE RECEIVED  NO GROWTH TO DATE CULTURE WILL BE HELD FOR 5 DAYS BEFORE ISSUING A  FINAL NEGATIVE REPORT Performed at [facility]    Report Status PENDING  Incomplete       Scheduled Meds:  Scheduled Meds: . sodium chloride   Intravenous Once  . ALPRAZolam  0.5 mg Oral QHS  . amLODipine  10 mg Oral Daily  . folic acid  1 mg Oral Daily  . gabapentin  100 mg Oral BID  . insulin aspart  0-9 Units Subcutaneous TID WC  . [START ON 09/09/2014] levofloxacin  750 mg Oral Q48H  . pantoprazole  40 mg Oral Daily  . sodium chloride  3 mL Intravenous Q12H  . thiamine  100 mg Oral Daily  . vitamin B-12  1,000 mcg Oral Daily   Time spent on care of this patient: 25 mins  Elsah ,  [facility]  [email or web address] Password [email or web address] 09/08/2014, 12:52 PM   LOS: 5 days

## 2014-09-08 NOTE — Progress Notes (Signed)
PICC line nurse in room to put PICC line in for blood transfusion.

## 2014-09-08 NOTE — Progress Notes (Signed)
Peripherally Inserted Central Catheter/Midline Placement  The IV Nurse has discussed with the patient and/or persons authorized to consent for the patient, the purpose of this procedure and the potential benefits and risks involved with this procedure.  The benefits include less needle sticks, lab draws from the catheter and patient may be discharged home with the catheter.  Risks include, but not limited to, infection, bleeding, blood clot (thrombus formation), and puncture of an artery; nerve damage and irregular heat beat.  Alternatives to this procedure were also discussed.  PICC/Midline Placement Documentation        Michio Thier, Nicolette Bang 09/08/2014, 2:47 PM

## 2014-09-09 LAB — RESPIRATORY VIRUS PANEL
ADENOVIRUS: NEGATIVE
Influenza A: NEGATIVE
Influenza B: NEGATIVE
METAPNEUMOVIRUS: NEGATIVE
PARAINFLUENZA 3 A: NEGATIVE
Parainfluenza 1: NEGATIVE
Parainfluenza 2: NEGATIVE
RESPIRATORY SYNCYTIAL VIRUS A: NEGATIVE
RHINOVIRUS: NEGATIVE
Respiratory Syncytial Virus B: NEGATIVE

## 2014-09-09 LAB — TYPE AND SCREEN
ABO/RH(D): AB POS
Antibody Screen: NEGATIVE
UNIT DIVISION: 0

## 2014-09-09 LAB — BASIC METABOLIC PANEL
Anion gap: 9 (ref 5–15)
BUN: 16 mg/dL (ref 6–23)
CO2: 25 mmol/L (ref 19–32)
Calcium: 9.3 mg/dL (ref 8.4–10.5)
Chloride: 106 mmol/L (ref 96–112)
Creatinine, Ser: 2.53 mg/dL — ABNORMAL HIGH (ref 0.50–1.35)
GFR calc Af Amer: 29 mL/min — ABNORMAL LOW (ref >90)
GFR, EST NON AFRICAN AMERICAN: 25 mL/min — AB (ref >90)
Glucose, Bld: 153 mg/dL — ABNORMAL HIGH (ref 70–99)
POTASSIUM: 3.8 mmol/L (ref 3.5–5.1)
SODIUM: 140 mmol/L (ref 135–145)

## 2014-09-09 LAB — CBC
HCT: 19.5 % — ABNORMAL LOW (ref 39.0–52.0)
Hemoglobin: 6.4 g/dL — CL (ref 13.0–17.0)
MCH: 24.2 pg — AB (ref 26.0–34.0)
MCHC: 32.8 g/dL (ref 30.0–36.0)
MCV: 73.6 fL — ABNORMAL LOW (ref 78.0–100.0)
Platelets: 311 10*3/uL (ref 150–400)
RBC: 2.65 MIL/uL — ABNORMAL LOW (ref 4.22–5.81)
RDW: 16.5 % — AB (ref 11.5–15.5)
WBC: 15.8 10*3/uL — ABNORMAL HIGH (ref 4.0–10.5)

## 2014-09-09 LAB — GLUCOSE, CAPILLARY: GLUCOSE-CAPILLARY: 149 mg/dL — AB (ref 70–99)

## 2014-09-09 MED ORDER — LEVOFLOXACIN 750 MG PO TABS: 750.0000 mg | ORAL_TABLET | ORAL | Status: DC

## 2014-09-09 MED ORDER — HYDROCODONE-ACETAMINOPHEN 5-325 MG PO TABS: 1.0000 | ORAL_TABLET | Freq: Three times a day (TID) | ORAL | Status: DC | PRN

## 2014-09-09 MED ORDER — AMLODIPINE BESYLATE 10 MG PO TABS: 10.0000 mg | ORAL_TABLET | Freq: Every day | ORAL | Status: DC

## 2014-09-09 NOTE — Progress Notes (Signed)
Discharge instructions and medications discussed with patient and ex-wife.  All questions answered.

## 2014-09-09 NOTE — Discharge Summary (Signed)
Physician Discharge Summary  [name] [id number] DOB: Feb 16, 1951 DOA: 09/03/2014  PCP: Philis Fendt, MD  Admit date: 09/03/2014 Discharge date: 09/09/2014  Time spent: greater than 30 minutes  Recommendations for Outpatient Follow-up:  1. Monitor BMET, h/h 2. Unclear which medications patient actually taking at home. Asked patient to bring all meds to f/u visit with PCP  Discharge Diagnoses:  Principal Problem:   Hyperkalemia Active Problems:   Sickle cell anemia   Hypertension   Diabetes mellitus   Leukocytosis   LBBB (left bundle branch block)   CKD (chronic kidney disease), stage III   AKI (acute kidney injury)   Acute encephalopathy   Fever   Discharge Condition: stable  Diet recommendation: renal  Filed Weights   09/06/14 2101 09/07/14 2100 09/08/14 2100  Weight: 83.008 kg (183 lb) 83.643 kg (184 lb 6.4 oz) 82.827 kg (182 lb 9.6 oz)    History of present illness:  64 y.o. male with Past medical history of hypertension, diabetes mellitus, sickle cell anemia, chronic kidney disease, cocaine abuse, alcohol use. The patient is presenting with complaints of not feeling well, confusion, left-sided numbness. Reportedly his initial complaining ED was tremors. Initially patient's family also reported that the patient was appearing confused and was not speaking clearly. Patient denied any complaint of headache, chest pain, abdominal pain, pain anywhere else. Patient denied any blurred vision or speech difficulty. Patient denied any complaints of cough or shortness of breath or nausea or vomiting or diarrhea or constipation or burning urination to me. Patient mentions he has been taking all his medications. Patient mentions Onglyza is a new medication other than that no changes in his medications reported. Initially in the ER he mentions he had vomiting and diarrhea 2 weeks earlier. Patient drinks 102 beer on a daily basis with last drink being on 09/02/2014. Patient  also smokes crack last use 3 days ago. On admission, potassium 6, creatinine 3.3  Hospital Course:  Admitted to telemetry.  Acute renal failure Improved to 2 range with hydration. Has h/o CKD. US showed no hydronephrosis, did show medical renal disease. Nephrology consulted.   Hyperkalemia Resolved with medical therapy  Metabolic acidosis Resolved after brief bicarbonate gtt  Toxic metabolic encephalopathy Now at baseline  Polysubstance abuse: etoh and cocaine No evidence of alcohol withdrawal.  Counseled.  Sickle cell disease - chronic anemia After hydration, hgb dropped to nadir of 5.5. Baseline usually 6-8. transfused 1 unit pRBC. At discharge, 6.4. No evidence of sickle crisis  DM2 CBG variable - treated with SSI  HTN BP controlled at this time  Fever: developed intermittent fevers during hospitalization. Did admit to cough.  Repeat CXR negative. Flu PCR negative. UA negative. Blood cultures negative to date.  RSV panel pending. Started on levaquin for acute bronchitis with improvement in cough and fever  Leukocytosis: See above. Appears chronic.  Consultants: Nephrology  Procedures: none  Discharge Exam: Filed Vitals:   09/09/14 0300  BP: 162/81  Pulse: 85  Temp: 98.1 F (36.7 C)  Resp: 18    General: a and o Cardiovascular: RRR Respiratory: CTA  Discharge Instructions   Discharge Instructions    Activity as tolerated - No restrictions    Complete by:  As directed      Diet - low sodium heart healthy    Complete by:  As directed      Diet Carb Modified    Complete by:  As directed      Discharge instructions    Complete  by:  As directed   Bring all your medications with you to next follow up appointment.          Current Discharge Medication List    START taking these medications   Details  levofloxacin (LEVAQUIN) 750 MG tablet Take 1 tablet (750 mg total) by mouth every other day. On Friday only Qty: 1 tablet, Refills: 0       CONTINUE these medications which have CHANGED   Details  amLODipine (NORVASC) 10 MG tablet Take 1 tablet (10 mg total) by mouth daily. Qty: 30 tablet, Refills: 0    HYDROcodone-acetaminophen (NORCO/VICODIN) 5-325 MG per tablet Take 1 tablet by mouth every 8 (eight) hours as needed for moderate pain. Qty: 10 tablet, Refills: 0      CONTINUE these medications which have NOT CHANGED   Details  omeprazole (PRILOSEC) 20 MG capsule Take 20 mg by mouth daily.  Refills: 5    ONGLYZA 5 MG TABS tablet Take 5 mg by mouth daily. Refills: 1    acetaminophen (TYLENOL) 325 MG tablet Take 2 tablets (650 mg total) by mouth every 6 (six) hours as needed for mild pain or fever. Qty: 45 tablet, Refills: 0    folic acid (FOLVITE) 1 MG tablet Take 1 tablet (1 mg total) by mouth daily. Qty: 30 tablet, Refills: 0    labetalol (NORMODYNE) 100 MG tablet Take 1 tablet (100 mg total) by mouth 2 (two) times daily. Qty: 60 tablet, Refills: 0      STOP taking these medications     amLODipine-benazepril (LOTREL) 10-20 MG per capsule      gabapentin (NEURONTIN) 300 MG capsule      hydrochlorothiazide (MICROZIDE) 12.5 MG capsule      metFORMIN (GLUCOPHAGE) 1000 MG tablet      ALPRAZolam (XANAX) 0.25 MG tablet      bisacodyl (DULCOLAX) 10 MG suppository      diphenhydrAMINE (BENADRYL) 25 mg capsule      docusate sodium 100 MG CAPS      gabapentin (NEURONTIN) 100 MG capsule      glipiZIDE (GLUCOTROL XL) 10 MG 24 hr tablet      ondansetron (ZOFRAN) 4 MG tablet      pantoprazole (PROTONIX) 40 MG tablet      polyethylene glycol (MIRALAX / GLYCOLAX) packet      sitaGLIPtin (JANUVIA) 25 MG tablet        Allergies  Allergen Reactions  . Morphine And Related Itching and Swelling   Follow-up Information    Follow up with Philis Fendt, MD.   Specialty:  Internal Medicine   Contact information:   [address] [place] [address] [phone]        The results of significant  diagnostics from this hospitalization (including imaging, microbiology, ancillary and laboratory) are listed below for reference.    Significant Diagnostic Studies: Dg Chest 2 View  09/03/2014   CLINICAL DATA:  Leukocytosis.  Acute encephalopathy  EXAM: CHEST  2 VIEW  COMPARISON:  04/13/2013  FINDINGS: Heart size and vascularity are normal. Lungs are clear without infiltrate or effusion.  Skeletal changes of Sickle cell  IMPRESSION: No active cardiopulmonary disease.   Electronically Signed   By: Franchot Gallo M.D.   On: 09/03/2014 07:37   Ct Head Wo Contrast  09/03/2014   CLINICAL DATA:  64 year old male with left side numbness. Lightheadedness for several days. Encephalopathy. Initial encounter.  EXAM: CT HEAD WITHOUT CONTRAST  TECHNIQUE: Contiguous axial images were obtained from the  base of the skull through the vertex without intravenous contrast.  COMPARISON:  None.  FINDINGS: Visualized paranasal sinuses and mastoids are clear. No acute osseous abnormality identified. No acute orbit or scalp soft tissue findings. Postoperative changes to the right globe.  Cerebral volume is within normal limits for age. No ventriculomegaly. No midline shift, mass effect, or evidence of intracranial mass lesion. Minimal nonspecific white matter hypodensity, otherwise normal gray-white matter differentiation. No evidence of cortically based acute infarction identified. No suspicious intracranial vascular hyperdensity. No acute intracranial hemorrhage identified.  IMPRESSION: Negative for age non contrast CT appearance of the brain.   Electronically Signed   By: Genevie Ann M.D.   On: 09/03/2014 07:17   US Renal  09/03/2014   CLINICAL DATA:  64 year old male with acute renal injury. Acute encephalopathy. Initial encounter.  EXAM: RENAL/URINARY TRACT ULTRASOUND COMPLETE  COMPARISON:  CT Abdomen and Pelvis 09/03/2003  FINDINGS: Right Kidney:  Length: 9.8 cm. Echogenic somewhat heterogeneous renal cortex (image 4). No  hydronephrosis or right renal mass.  Left Kidney:  Length: 11.7 cm. Echogenic renal cortex similar to the right side. No hydronephrosis or left renal mass.  Bladder:  Appears normal for degree of bladder distention.  IMPRESSION: Chronic medical renal disease. No hydronephrosis or renal mass identified.   Electronically Signed   By: Genevie Ann M.D.   On: 09/03/2014 09:01   Dg Chest Port 1 View  09/06/2014   CLINICAL DATA:  Fever. History of sickle cell, diabetes, and hypertension.  EXAM: PORTABLE CHEST - 1 VIEW  COMPARISON:  09/03/2014  FINDINGS: Normal heart size and pulmonary vascularity. No focal airspace disease or consolidation in the lungs. No blunting of costophrenic angles. No pneumothorax. Mediastinal contours appear intact. Bone sclerosis consistent with history of sickle cell.  IMPRESSION: No active disease.   Electronically Signed   By: Lucienne Capers M.D.   On: 09/06/2014 22:10    Microbiology: Recent Results (from the past 240 hour(s))  MRSA PCR Screening     Status: None   Collection Time: 09/03/14  1:26 PM  Result Value Ref Range Status   MRSA by PCR NEGATIVE NEGATIVE Final    Comment:        The GeneXpert MRSA Assay (FDA approved for NASAL specimens only), is one component of a comprehensive MRSA colonization surveillance program. It is not intended to diagnose MRSA infection nor to guide or monitor treatment for MRSA infections.   Culture, blood (routine x 2)     Status: None (Preliminary result)   Collection Time: 09/06/14  9:59 PM  Result Value Ref Range Status   Specimen Description BLOOD RIGHT HAND  Final   Special Requests BOTTLES DRAWN AEROBIC ONLY 3CC  Final   Culture   Final           BLOOD CULTURE RECEIVED NO GROWTH TO DATE CULTURE WILL BE HELD FOR 5 DAYS BEFORE ISSUING A FINAL NEGATIVE REPORT Performed at [facility]    Report Status PENDING  Incomplete  Urine culture     Status: None   Collection Time: 09/06/14 10:03 PM  Result Value Ref Range  Status   Specimen Description URINE, RANDOM  Final   Special Requests NONE  Final   Colony Count NO GROWTH Performed at [facility]   Final   Culture NO GROWTH Performed at [facility]   Final   Report Status 09/08/2014 FINAL  Final  Culture, blood (routine x 2)     Status: None (  Preliminary result)   Collection Time: 09/06/14 10:11 PM  Result Value Ref Range Status   Specimen Description BLOOD RIGHT WRIST  Final   Special Requests BOTTLES DRAWN AEROBIC ONLY 1CC  Final   Culture   Final           BLOOD CULTURE RECEIVED NO GROWTH TO DATE CULTURE WILL BE HELD FOR 5 DAYS BEFORE ISSUING A FINAL NEGATIVE REPORT Performed at [facility]    Report Status PENDING  Incomplete     Labs: Basic Metabolic Panel:  Recent Labs Lab 09/03/14 0750  09/03/14 1723 09/04/14 0345 09/05/14 [id number] 09/06/14 0725 09/07/14 0640 09/08/14 0602 09/09/14 0535  NA  --   < > 137 142 142 137 137 133* 140  K  --   < > 6.1* 4.7 4.3 3.9 3.9 3.5 3.8  CL  --   < > 113* 114* 109 106 108 103 106  CO2  --   < > 13* 26 28 26 24 24 25   GLUCOSE  --   < > 224* 124* 125* 158* 192* 150* 153*  BUN  --   < > 36* 30* 20 18 18 17 16   CREATININE  --   < > 3.29* 3.17* 2.68* 2.62* 2.62* 2.65* 2.53*  CALCIUM  --   < > 8.8 9.2 9.3 9.1 8.8 9.1 9.3  MG 2.2  --   --   --   --   --   --   --   --   PHOS 4.9*  --  4.3 4.7* 3.5 2.9  --   --   --   < > = values in this interval not displayed. Liver Function Tests:  Recent Labs Lab 09/03/14 0046 09/03/14 1723 09/04/14 0345 09/05/14 0814 09/06/14 0725  AST 31  --   --   --   --   ALT 15  --   --   --   --   ALKPHOS 59  --   --   --   --   BILITOT 1.5*  --   --   --   --   PROT 8.4*  --   --   --   --   ALBUMIN 4.8 3.6 3.3* 3.8 3.7   No results for input(s): LIPASE, AMYLASE in the last 168 hours. No results for input(s): AMMONIA in the last 168 hours. CBC:  Recent Labs Lab 09/03/14 0358  09/05/14 0814 09/07/14 0640 09/08/14 0602  09/08/14 1045 09/09/14 0535  WBC 12.4*  --  19.9* 24.6* 20.7*  --  15.8*  NEUTROABS 6.2  --   --   --  15.1*  --   --   HGB 6.9*  --  7.0* 6.0* 5.6* 5.5* 6.4*  HCT 21.1*  < > 22.2* 18.5* 17.2* 16.9* 19.5*  MCV 71.0*  --  72.1* 71.7* 71.4*  --  73.6*  PLT 340  --  322 270 311  --  311  < > = values in this interval not displayed. Cardiac Enzymes:  Recent Labs Lab 09/03/14 0750 09/05/14 0814 09/05/14 1418 09/05/14 1820  CKTOTAL 238*  --   --   --   CKMB 6.7*  --   --   --   TROPONINI <0.03 <0.03 <0.03 <0.03   BNP: BNP (last 3 results) No results for input(s): BNP in the last 8760 hours.  ProBNP (last 3 results) No results for input(s): PROBNP in the last 8760 hours.  CBG:  Recent  Labs Lab 09/08/14 0741 09/08/14 1149 09/08/14 1641 09/08/14 2141 09/09/14 0740  GLUCAP 145* 190* 249* 251* 149*       Signed:  Celine Dishman L  [facility] 09/09/2014, 9:49 AM

## 2014-09-13 LAB — CULTURE, BLOOD (ROUTINE X 2)
Culture: NO GROWTH
Culture: NO GROWTH

## 2014-09-14 DIAGNOSIS — I10 Essential (primary) hypertension: Secondary | ICD-10-CM | POA: Diagnosis not present

## 2014-09-14 DIAGNOSIS — E1140 Type 2 diabetes mellitus with diabetic neuropathy, unspecified: Secondary | ICD-10-CM | POA: Diagnosis not present

## 2014-09-14 DIAGNOSIS — G894 Chronic pain syndrome: Secondary | ICD-10-CM | POA: Diagnosis not present

## 2014-09-14 DIAGNOSIS — N183 Chronic kidney disease, stage 3 (moderate): Secondary | ICD-10-CM | POA: Diagnosis not present

## 2015-07-08 ENCOUNTER — Emergency Department (HOSPITAL_COMMUNITY): Payer: Medicare Other

## 2015-07-08 ENCOUNTER — Encounter (HOSPITAL_COMMUNITY): Payer: Self-pay

## 2015-07-08 ENCOUNTER — Emergency Department (HOSPITAL_COMMUNITY)
Admission: EM | Admit: 2015-07-08 | Discharge: 2015-07-08 | Disposition: A | Discharge status: Home / Self Care | Payer: Medicare Other | Attending: Emergency Medicine | Admitting: Emergency Medicine

## 2015-07-08 DIAGNOSIS — Z87891 Personal history of nicotine dependence: Secondary | ICD-10-CM | POA: Diagnosis not present

## 2015-07-08 DIAGNOSIS — R222 Localized swelling, mass and lump, trunk: Secondary | ICD-10-CM | POA: Insufficient documentation

## 2015-07-08 DIAGNOSIS — Z87438 Personal history of other diseases of male genital organs: Secondary | ICD-10-CM | POA: Insufficient documentation

## 2015-07-08 DIAGNOSIS — R0602 Shortness of breath: Secondary | ICD-10-CM | POA: Insufficient documentation

## 2015-07-08 DIAGNOSIS — I10 Essential (primary) hypertension: Secondary | ICD-10-CM | POA: Insufficient documentation

## 2015-07-08 DIAGNOSIS — E119 Type 2 diabetes mellitus without complications: Secondary | ICD-10-CM | POA: Insufficient documentation

## 2015-07-08 DIAGNOSIS — Z79899 Other long term (current) drug therapy: Secondary | ICD-10-CM | POA: Diagnosis not present

## 2015-07-08 DIAGNOSIS — D571 Sickle-cell disease without crisis: Secondary | ICD-10-CM | POA: Diagnosis not present

## 2015-07-08 DIAGNOSIS — R079 Chest pain, unspecified: Secondary | ICD-10-CM | POA: Diagnosis present

## 2015-07-08 DIAGNOSIS — Z7984 Long term (current) use of oral hypoglycemic drugs: Secondary | ICD-10-CM | POA: Diagnosis not present

## 2015-07-08 DIAGNOSIS — R0789 Other chest pain: Secondary | ICD-10-CM | POA: Diagnosis not present

## 2015-07-08 LAB — I-STAT TROPONIN, ED
Troponin i, poc: 0 ng/mL (ref 0.00–0.08)
Troponin i, poc: 0.01 ng/mL (ref 0.00–0.08)

## 2015-07-08 LAB — COMPREHENSIVE METABOLIC PANEL
ALT: 12 U/L — ABNORMAL LOW (ref 17–63)
AST: 22 U/L (ref 15–41)
Albumin: 4.3 g/dL (ref 3.5–5.0)
Alkaline Phosphatase: 67 U/L (ref 38–126)
Anion gap: 10 (ref 5–15)
BUN: 25 mg/dL — ABNORMAL HIGH (ref 6–20)
CALCIUM: 9.9 mg/dL (ref 8.9–10.3)
CHLORIDE: 106 mmol/L (ref 101–111)
CO2: 24 mmol/L (ref 22–32)
Creatinine, Ser: 3.02 mg/dL — ABNORMAL HIGH (ref 0.61–1.24)
GFR, EST AFRICAN AMERICAN: 23 mL/min — AB (ref >60)
GFR, EST NON AFRICAN AMERICAN: 20 mL/min — AB (ref >60)
Glucose, Bld: 170 mg/dL — ABNORMAL HIGH (ref 65–99)
Potassium: 5.3 mmol/L — ABNORMAL HIGH (ref 3.5–5.1)
Sodium: 140 mmol/L (ref 135–145)
Total Bilirubin: 1.7 mg/dL — ABNORMAL HIGH (ref 0.3–1.2)
Total Protein: 7.7 g/dL (ref 6.5–8.1)

## 2015-07-08 LAB — CBC
HEMATOCRIT: 22.4 % — AB (ref 39.0–52.0)
Hemoglobin: 7.2 g/dL — ABNORMAL LOW (ref 13.0–17.0)
MCH: 22.4 pg — AB (ref 26.0–34.0)
MCHC: 32.1 g/dL (ref 30.0–36.0)
MCV: 69.8 fL — AB (ref 78.0–100.0)
PLATELETS: 392 10*3/uL (ref 150–400)
RBC: 3.21 MIL/uL — AB (ref 4.22–5.81)
RDW: 16.3 % — ABNORMAL HIGH (ref 11.5–15.5)
WBC: 13.5 10*3/uL — ABNORMAL HIGH (ref 4.0–10.5)

## 2015-07-08 MED ORDER — TRAMADOL HCL 50 MG PO TABS: 50.0000 mg | ORAL_TABLET | Freq: Four times a day (QID) | ORAL | Status: DC | PRN

## 2015-07-08 MED ORDER — FENTANYL CITRATE (PF) 100 MCG/2ML IJ SOLN
50.0000 ug | Freq: Once | INTRAMUSCULAR | Status: AC
  Administered 2015-07-08: 50 ug via INTRAVENOUS
  Filled 2015-07-08: qty 2

## 2015-07-08 NOTE — ED Notes (Signed)
Nurse attempted once left AC unable to obtain IV or blood draw. Patient tolerated without incident. Stated difficult to obtain IV and had a PICC line in the past. Second nurse will attempt.

## 2015-07-08 NOTE — ED Notes (Signed)
EDP at bedside  

## 2015-07-08 NOTE — ED Notes (Signed)
Walked PT in Pod E, O2 never dropped below 97% Pulse no higher than 81.

## 2015-07-08 NOTE — ED Notes (Signed)
Patient transported to X-ray 

## 2015-07-08 NOTE — Discharge Instructions (Signed)
Return here as needed.  Follow-up with your primary care doctor.  Your testing here today did not show any significant abnormalities

## 2015-07-08 NOTE — ED Notes (Signed)
Patient reports SSCP since yesterday, describes as sharp, no other associated symptoms. Reports that the pain is worse with exertion

## 2015-07-08 NOTE — ED Notes (Signed)
Pt is in stable condition upon d/c and is escorted from ED via wheelchair. 

## 2015-07-08 NOTE — ED Notes (Signed)
Provider at bedside
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
## 2016-03-25 ENCOUNTER — Emergency Department (HOSPITAL_COMMUNITY): Payer: Medicare HMO

## 2016-03-25 ENCOUNTER — Inpatient Hospital Stay (HOSPITAL_COMMUNITY)
Admission: EM | Admit: 2016-03-25 | Discharge: 2016-03-27 | DRG: 897 | Disposition: A | Discharge status: Home / Self Care | Payer: Medicare HMO | Attending: Internal Medicine | Admitting: Internal Medicine

## 2016-03-25 ENCOUNTER — Encounter (HOSPITAL_COMMUNITY): Payer: Self-pay | Admitting: Emergency Medicine

## 2016-03-25 DIAGNOSIS — D571 Sickle-cell disease without crisis: Secondary | ICD-10-CM | POA: Diagnosis present

## 2016-03-25 DIAGNOSIS — E1129 Type 2 diabetes mellitus with other diabetic kidney complication: Secondary | ICD-10-CM

## 2016-03-25 DIAGNOSIS — F14188 Cocaine abuse with other cocaine-induced disorder: Secondary | ICD-10-CM | POA: Diagnosis present

## 2016-03-25 DIAGNOSIS — E875 Hyperkalemia: Secondary | ICD-10-CM | POA: Diagnosis present

## 2016-03-25 DIAGNOSIS — I129 Hypertensive chronic kidney disease with stage 1 through stage 4 chronic kidney disease, or unspecified chronic kidney disease: Secondary | ICD-10-CM | POA: Diagnosis present

## 2016-03-25 DIAGNOSIS — Z87891 Personal history of nicotine dependence: Secondary | ICD-10-CM

## 2016-03-25 DIAGNOSIS — N184 Chronic kidney disease, stage 4 (severe): Secondary | ICD-10-CM | POA: Diagnosis present

## 2016-03-25 DIAGNOSIS — R0789 Other chest pain: Secondary | ICD-10-CM | POA: Diagnosis not present

## 2016-03-25 DIAGNOSIS — Z9889 Other specified postprocedural states: Secondary | ICD-10-CM

## 2016-03-25 DIAGNOSIS — Z808 Family history of malignant neoplasm of other organs or systems: Secondary | ICD-10-CM | POA: Diagnosis not present

## 2016-03-25 DIAGNOSIS — E1122 Type 2 diabetes mellitus with diabetic chronic kidney disease: Secondary | ICD-10-CM | POA: Diagnosis present

## 2016-03-25 DIAGNOSIS — Z833 Family history of diabetes mellitus: Secondary | ICD-10-CM | POA: Diagnosis not present

## 2016-03-25 DIAGNOSIS — D72829 Elevated white blood cell count, unspecified: Secondary | ICD-10-CM | POA: Diagnosis present

## 2016-03-25 DIAGNOSIS — R072 Precordial pain: Secondary | ICD-10-CM

## 2016-03-25 DIAGNOSIS — E162 Hypoglycemia, unspecified: Secondary | ICD-10-CM

## 2016-03-25 DIAGNOSIS — I25111 Atherosclerotic heart disease of native coronary artery with angina pectoris with documented spasm: Secondary | ICD-10-CM | POA: Diagnosis present

## 2016-03-25 DIAGNOSIS — I447 Left bundle-branch block, unspecified: Secondary | ICD-10-CM | POA: Diagnosis present

## 2016-03-25 DIAGNOSIS — F1410 Cocaine abuse, uncomplicated: Secondary | ICD-10-CM | POA: Diagnosis not present

## 2016-03-25 DIAGNOSIS — N179 Acute kidney failure, unspecified: Secondary | ICD-10-CM | POA: Diagnosis present

## 2016-03-25 DIAGNOSIS — Z9049 Acquired absence of other specified parts of digestive tract: Secondary | ICD-10-CM | POA: Diagnosis not present

## 2016-03-25 DIAGNOSIS — Z79899 Other long term (current) drug therapy: Secondary | ICD-10-CM

## 2016-03-25 DIAGNOSIS — N183 Chronic kidney disease, stage 3 unspecified: Secondary | ICD-10-CM | POA: Diagnosis present

## 2016-03-25 DIAGNOSIS — I10 Essential (primary) hypertension: Secondary | ICD-10-CM

## 2016-03-25 DIAGNOSIS — Z885 Allergy status to narcotic agent status: Secondary | ICD-10-CM

## 2016-03-25 DIAGNOSIS — R011 Cardiac murmur, unspecified: Secondary | ICD-10-CM | POA: Diagnosis present

## 2016-03-25 DIAGNOSIS — Z85038 Personal history of other malignant neoplasm of large intestine: Secondary | ICD-10-CM

## 2016-03-25 DIAGNOSIS — D5700 Hb-SS disease with crisis, unspecified: Secondary | ICD-10-CM | POA: Diagnosis not present

## 2016-03-25 DIAGNOSIS — R079 Chest pain, unspecified: Secondary | ICD-10-CM

## 2016-03-25 DIAGNOSIS — N189 Chronic kidney disease, unspecified: Secondary | ICD-10-CM | POA: Diagnosis present

## 2016-03-25 DIAGNOSIS — N185 Chronic kidney disease, stage 5: Secondary | ICD-10-CM | POA: Diagnosis present

## 2016-03-25 LAB — CBC
HEMATOCRIT: 19.2 % — AB (ref 39.0–52.0)
Hemoglobin: 6.3 g/dL — CL (ref 13.0–17.0)
MCH: 21.6 pg — ABNORMAL LOW (ref 26.0–34.0)
MCHC: 32.8 g/dL (ref 30.0–36.0)
MCV: 65.8 fL — AB (ref 78.0–100.0)
Platelets: 388 10*3/uL (ref 150–400)
RBC: 2.92 MIL/uL — ABNORMAL LOW (ref 4.22–5.81)
RDW: 17.6 % — ABNORMAL HIGH (ref 11.5–15.5)
WBC: 17.4 10*3/uL — ABNORMAL HIGH (ref 4.0–10.5)

## 2016-03-25 LAB — RETICULOCYTES
RBC.: 2.96 MIL/uL — ABNORMAL LOW (ref 4.22–5.81)
Retic Count, Absolute: 177.6 10*3/uL (ref 19.0–186.0)
Retic Ct Pct: 6 % — ABNORMAL HIGH (ref 0.4–3.1)

## 2016-03-25 LAB — HEPATIC FUNCTION PANEL
ALBUMIN: 4.7 g/dL (ref 3.5–5.0)
ALK PHOS: 76 U/L (ref 38–126)
ALT: 13 U/L — ABNORMAL LOW (ref 17–63)
AST: 31 U/L (ref 15–41)
BILIRUBIN DIRECT: 0.2 mg/dL (ref 0.1–0.5)
BILIRUBIN TOTAL: 1.3 mg/dL — AB (ref 0.3–1.2)
Indirect Bilirubin: 1.1 mg/dL — ABNORMAL HIGH (ref 0.3–0.9)
Total Protein: 9.1 g/dL — ABNORMAL HIGH (ref 6.5–8.1)

## 2016-03-25 LAB — URINE MICROSCOPIC-ADD ON: Bacteria, UA: NONE SEEN

## 2016-03-25 LAB — I-STAT TROPONIN, ED: Troponin i, poc: 0.01 ng/mL (ref 0.00–0.08)

## 2016-03-25 LAB — TROPONIN I
Troponin I: <0.03 ng/mL (ref <0.03)
Troponin I: <0.03 ng/mL (ref <0.03)

## 2016-03-25 LAB — BASIC METABOLIC PANEL
Anion gap: 7 (ref 5–15)
BUN: 34 mg/dL — ABNORMAL HIGH (ref 6–20)
CALCIUM: 9.5 mg/dL (ref 8.9–10.3)
CO2: 18 mmol/L — ABNORMAL LOW (ref 22–32)
Chloride: 105 mmol/L (ref 101–111)
Creatinine, Ser: 3.7 mg/dL — ABNORMAL HIGH (ref 0.61–1.24)
GFR calc Af Amer: 18 mL/min — ABNORMAL LOW (ref >60)
GFR calc non Af Amer: 16 mL/min — ABNORMAL LOW (ref >60)
GLUCOSE: 192 mg/dL — AB (ref 65–99)
POTASSIUM: 5.7 mmol/L — AB (ref 3.5–5.1)
Sodium: 130 mmol/L — ABNORMAL LOW (ref 135–145)

## 2016-03-25 LAB — URINALYSIS, ROUTINE W REFLEX MICROSCOPIC
BILIRUBIN URINE: NEGATIVE
GLUCOSE, UA: 100 mg/dL — AB
KETONES UR: NEGATIVE mg/dL
Leukocytes, UA: NEGATIVE
Nitrite: NEGATIVE
PH: 5.5 (ref 5.0–8.0)
PROTEIN: NEGATIVE mg/dL
Specific Gravity, Urine: 1.004 — ABNORMAL LOW (ref 1.005–1.030)

## 2016-03-25 LAB — GLUCOSE, CAPILLARY
GLUCOSE-CAPILLARY: 252 mg/dL — AB (ref 65–99)
Glucose-Capillary: 118 mg/dL — ABNORMAL HIGH (ref 65–99)
Glucose-Capillary: 106 mg/dL — ABNORMAL HIGH (ref 65–99)

## 2016-03-25 LAB — CBG MONITORING, ED
Glucose-Capillary: 229 mg/dL — ABNORMAL HIGH (ref 65–99)
Glucose-Capillary: 169 mg/dL — ABNORMAL HIGH (ref 65–99)

## 2016-03-25 LAB — RAPID URINE DRUG SCREEN, HOSP PERFORMED
AMPHETAMINES: NOT DETECTED
BARBITURATES: NOT DETECTED
Benzodiazepines: NOT DETECTED
COCAINE: POSITIVE — AB
OPIATES: NOT DETECTED
TETRAHYDROCANNABINOL: NOT DETECTED

## 2016-03-25 MED ORDER — HYDROMORPHONE HCL 1 MG/ML IJ SOLN
1.0000 mg | INTRAMUSCULAR | Status: AC | PRN
  Administered 2016-03-25: 1 mg via INTRAVENOUS
  Filled 2016-03-25: qty 1

## 2016-03-25 MED ORDER — HYDROMORPHONE HCL 2 MG/ML IJ SOLN
1.0000 mg | Freq: Once | INTRAMUSCULAR | Status: AC
  Administered 2016-03-25: 1 mg via INTRAVENOUS
  Filled 2016-03-25: qty 1

## 2016-03-25 MED ORDER — SODIUM CHLORIDE 0.9 % IV SOLN
INTRAVENOUS | Status: DC
  Administered 2016-03-25: via INTRAVENOUS

## 2016-03-25 MED ORDER — SENNOSIDES-DOCUSATE SODIUM 8.6-50 MG PO TABS
1.0000 | ORAL_TABLET | Freq: Two times a day (BID) | ORAL | Status: DC
  Administered 2016-03-25 – 2016-03-27 (×3): 1 via ORAL
  Filled 2016-03-25 (×4): qty 1

## 2016-03-25 MED ORDER — HYDROMORPHONE HCL 1 MG/ML IJ SOLN
1.0000 mg | INTRAMUSCULAR | Status: AC | PRN
  Administered 2016-03-26 (×2): 1 mg via INTRAVENOUS
  Filled 2016-03-25 (×2): qty 1

## 2016-03-25 MED ORDER — AMLODIPINE BESYLATE 10 MG PO TABS
10.0000 mg | ORAL_TABLET | Freq: Every day | ORAL | Status: DC
  Administered 2016-03-25 – 2016-03-27 (×3): 10 mg via ORAL
  Filled 2016-03-25 (×3): qty 1

## 2016-03-25 MED ORDER — ZOLPIDEM TARTRATE 5 MG PO TABS: 5.0000 mg | ORAL_TABLET | Freq: Every evening | ORAL | Status: DC | PRN

## 2016-03-25 MED ORDER — HYDROXYZINE HCL 25 MG PO TABS: 25.0000 mg | ORAL_TABLET | ORAL | Status: DC | PRN

## 2016-03-25 MED ORDER — PROMETHAZINE HCL 12.5 MG RE SUPP
12.5000 mg | RECTAL | Status: DC | PRN
  Filled 2016-03-25: qty 2

## 2016-03-25 MED ORDER — ONDANSETRON HCL 4 MG/2ML IJ SOLN: 4.0000 mg | Freq: Four times a day (QID) | INTRAMUSCULAR | Status: DC | PRN

## 2016-03-25 MED ORDER — ACETAMINOPHEN 325 MG PO TABS: 650.0000 mg | ORAL_TABLET | ORAL | Status: DC | PRN

## 2016-03-25 MED ORDER — POLYETHYLENE GLYCOL 3350 17 G PO PACK: 17.0000 g | PACK | Freq: Every day | ORAL | Status: DC | PRN

## 2016-03-25 MED ORDER — PANTOPRAZOLE SODIUM 40 MG PO TBEC
40.0000 mg | DELAYED_RELEASE_TABLET | Freq: Every morning | ORAL | Status: DC
  Administered 2016-03-27: 40 mg via ORAL
  Filled 2016-03-25 (×3): qty 1

## 2016-03-25 MED ORDER — INSULIN ASPART 100 UNIT/ML ~~LOC~~ SOLN
0.0000 [IU] | Freq: Three times a day (TID) | SUBCUTANEOUS | Status: DC
  Administered 2016-03-26 – 2016-03-27 (×2): 2 [IU] via SUBCUTANEOUS

## 2016-03-25 MED ORDER — HEPARIN SODIUM (PORCINE) 5000 UNIT/ML IJ SOLN: 5000.0000 [IU] | Freq: Three times a day (TID) | INTRAMUSCULAR | Status: DC

## 2016-03-25 MED ORDER — PROMETHAZINE HCL 25 MG PO TABS: 12.5000 mg | ORAL_TABLET | ORAL | Status: DC | PRN

## 2016-03-25 MED ORDER — HEPARIN SODIUM (PORCINE) 5000 UNIT/ML IJ SOLN
5000.0000 [IU] | Freq: Three times a day (TID) | INTRAMUSCULAR | Status: DC
  Administered 2016-03-25 – 2016-03-27 (×6): 5000 [IU] via SUBCUTANEOUS
  Filled 2016-03-25 (×6): qty 1

## 2016-03-25 MED ORDER — ASPIRIN EC 325 MG PO TBEC
325.0000 mg | DELAYED_RELEASE_TABLET | Freq: Every day | ORAL | Status: DC
  Administered 2016-03-25 – 2016-03-27 (×3): 325 mg via ORAL
  Filled 2016-03-25 (×3): qty 1

## 2016-03-25 MED ORDER — DIPHENHYDRAMINE HCL 25 MG PO CAPS
25.0000 mg | ORAL_CAPSULE | Freq: Four times a day (QID) | ORAL | Status: DC | PRN
  Administered 2016-03-26: 25 mg via ORAL
  Filled 2016-03-25: qty 1

## 2016-03-25 MED ORDER — GI COCKTAIL ~~LOC~~: 30.0000 mL | Freq: Four times a day (QID) | ORAL | Status: DC | PRN

## 2016-03-25 MED ORDER — FAMOTIDINE IN NACL 20-0.9 MG/50ML-% IV SOLN
20.0000 mg | Freq: Every morning | INTRAVENOUS | Status: DC
  Administered 2016-03-25 – 2016-03-26 (×2): 20 mg via INTRAVENOUS
  Filled 2016-03-25 (×3): qty 50

## 2016-03-25 MED ORDER — PANTOPRAZOLE SODIUM 40 MG PO TBEC: 40.0000 mg | DELAYED_RELEASE_TABLET | Freq: Every day | ORAL | Status: DC

## 2016-03-25 MED ORDER — FOLIC ACID 1 MG PO TABS
1.0000 mg | ORAL_TABLET | Freq: Every day | ORAL | Status: DC
  Administered 2016-03-25 – 2016-03-27 (×3): 1 mg via ORAL
  Filled 2016-03-25 (×3): qty 1

## 2016-03-25 MED ORDER — ALPRAZOLAM 0.25 MG PO TABS: 0.2500 mg | ORAL_TABLET | Freq: Two times a day (BID) | ORAL | Status: DC | PRN

## 2016-03-25 MED ORDER — SODIUM CHLORIDE 0.9 % IV BOLUS (SEPSIS)
1000.0000 mL | Freq: Once | INTRAVENOUS | Status: AC
  Administered 2016-03-25: 1000 mL via INTRAVENOUS

## 2016-03-25 MED ORDER — KETOROLAC TROMETHAMINE 15 MG/ML IJ SOLN
15.0000 mg | Freq: Four times a day (QID) | INTRAMUSCULAR | Status: DC
  Administered 2016-03-25: 15 mg via INTRAVENOUS
  Filled 2016-03-25: qty 1

## 2016-03-25 MED ORDER — HYDROMORPHONE HCL 2 MG/ML IJ SOLN
1.0000 mg | INTRAMUSCULAR | Status: DC | PRN
  Administered 2016-03-25: 1 mg via INTRAVENOUS
  Filled 2016-03-25: qty 1

## 2016-03-25 MED ORDER — SODIUM CHLORIDE 0.9 % IV SOLN: INTRAVENOUS | Status: DC

## 2016-03-25 NOTE — Consult Note (Signed)
CARDIOLOGY CONSULT NOTE   Patient ID: [name] MRN: [id number] DOB/AGE: 1950/09/03 65 y.o.  Admit date: 03/25/2016  Requesting Physician: Dr. Chad Cordial Primary Physician:   Philis Fendt, MD Primary Cardiologist:  New Reason for Consultation:  Chest pain.   HPI: [name] is a 65 y.o. male with a history of sickle cell anemia, cocaine/alchohol abuse, DMT2, CKD and HTN who presented to [facility] today with chest pain.    He used cocaine last night around 10:30pm. He saw down to watch some TV and had sudden onset of chest pain that was described as an "Elephant sitting on his chest." It was associated with significant shortness of breath but no diaphoresis. He did feel nauseated and had dry heaving but no vomiting. He took his blood sugar which was read as low and started to drink some orange juice. After his blood sugar went up, his chest pain did improve. Total time with chest pain was about an hour. He currently has some pain in his shoulders but nothing like last night. He also has pain in his joints that are reminiscent of prior sickle cell crisis. He has had chest pain before with sickle cell crisis which was somewhat similar to this. He occasionally drinks a beer and uses cocaine from time to time. Last cocaine use was last night at 10:30. He does not smoke. No lower extremity edema, orthopnea or PND. No dizziness or syncope.    Past Medical History:  Diagnosis Date  . Diabetes mellitus   . Hypertension   . Renal insufficiency   . Sickle cell anemia (HCC)    Hgb SS disease      Past Surgical History:  Procedure Laterality Date  . CHOLECYSTECTOMY    . COLON SURGERY     Colon cancer surgery, removed small amt not full colectomy  . COLONOSCOPY     nclear who follows him for this  . LEG SURGERY     For ? gangrene after running into barbwire fence at 65yo    Allergies  Allergen Reactions  . Morphine And Related Itching and Swelling    I have reviewed the  patient's current medications . aspirin EC  325 mg Oral Daily  . pantoprazole  40 mg Oral q morning - 10a   Or  . famotidine (PEPCID) IV  20 mg Intravenous q morning - 18E  . folic acid  1 mg Oral Daily  . heparin subcutaneous  5,000 Units Subcutaneous Q8H  . insulin aspart  0-9 Units Subcutaneous TID WC  . ketorolac  15 mg Intravenous Q6H  . senna-docusate  1 tablet Oral BID   . sodium chloride Stopped (03/25/16 0712)   acetaminophen, ALPRAZolam, gi cocktail, HYDROmorphone (DILAUDID) injection, hydrOXYzine, ondansetron (ZOFRAN) IV, polyethylene glycol, promethazine **OR** promethazine, zolpidem  Prior to Admission medications   Medication Sig Start Date End Date Taking? Authorizing Provider  amLODipine-benazepril (LOTREL) 10-20 MG capsule Take 1 capsule by mouth daily.   Yes Historical Provider, MD  folic acid (FOLVITE) 1 MG tablet Take 1 tablet (1 mg total) by mouth daily. 04/18/13  Yes Robbie Lis, MD  glipiZIDE (GLUCOTROL XL) 10 MG 24 hr tablet Take 10 mg by mouth 2 (two) times daily.   Yes Historical Provider, MD  hydrochlorothiazide (MICROZIDE) 12.5 MG capsule Take 12.5 mg by mouth daily.   Yes Historical Provider, MD  omeprazole (PRILOSEC) 20 MG capsule Take 20 mg by mouth daily.  08/12/14  Yes Historical Provider,  MD  acetaminophen (TYLENOL) 325 MG tablet Take 2 tablets (650 mg total) by mouth every 6 (six) hours as needed for mild pain or fever. Patient not taking: Reported on 03/25/2016 04/18/13   Robbie Lis, MD  amLODipine (NORVASC) 10 MG tablet Take 1 tablet (10 mg total) by mouth daily. Patient not taking: Reported on 03/25/2016 09/09/14   Delfina Redwood, MD  HYDROcodone-acetaminophen (NORCO/VICODIN) 5-325 MG per tablet Take 1 tablet by mouth every 8 (eight) hours as needed for moderate pain. Patient not taking: Reported on 03/25/2016 09/09/14   Delfina Redwood, MD  labetalol (NORMODYNE) 100 MG tablet Take 1 tablet (100 mg total) by mouth 2 (two) times daily. Patient  not taking: Reported on 03/25/2016 04/18/13   Robbie Lis, MD  levofloxacin (LEVAQUIN) 750 MG tablet Take 1 tablet (750 mg total) by mouth every other day. On Friday only Patient not taking: Reported on 03/25/2016 09/11/14   Delfina Redwood, MD  traMADol (ULTRAM) 50 MG tablet Take 1 tablet (50 mg total) by mouth every 6 (six) hours as needed. Patient not taking: Reported on 03/25/2016 07/08/15   Dalia Heading, PA-C     Social History   Social History  . Marital status: Divorced    Spouse name: N/A  . Number of children: N/A  . Years of education: N/A   Occupational History  . Not on file.   Social History Main Topics  . Smoking status: Former Smoker    Packs/day: 0.50    Years: 20.00    Quit date: 06/05/1966  . Smokeless tobacco: Never Used  . Alcohol use Yes     Comment: 1-2 times per week  . Drug use:     Types: Cocaine     Comment: monday - smoke crack  . Sexual activity: Not on file   Other Topics Concern  . Not on file   Social History Narrative   Retired Administrator, on disability. Former smoker. Living by himself, able to get around [facility].    Has a wife who checks on him but they don't live together sicne 2007   Quit drinking     Family Status  Relation Status  . Mother Deceased  . Father Deceased   Family History  Problem Relation Age of Onset  . Venous thrombosis Mother     Dec. of blood clot   . Diabetes type II Mother   . Throat cancer Father     Deceased of throat cancer   ROS:  Full 14 point review of systems complete and found to be negative unless listed above.  Physical Exam: Blood pressure (!) 188/87, pulse (!) 56, temperature 97.6 F (36.4 C), temperature source Oral, resp. rate 13, SpO2 97 %.  General: Well developed, well nourished, male in no acute distress. Chronically ill-appearing, jittery Head: Eyes PERRLA, No xanthomas.   Normocephalic and atraumatic, oropharynx without edema or exudate.  Lungs: CTAV Heart: HRRR S1 S2, no  rub/gallop, Heart regular rate and rhythm with S1, S2  murmur. pulses are 2+ extrem.   Neck: No carotid bruits. No lymphadenopathy. No  JVD. Abdomen: Bowel sounds present, abdomen soft and non-tender without masses or hernias noted. Msk:  No spine or cva tenderness. No weakness, no joint deformities or effusions. Extremities: No clubbing or cyanosis. No LE edema.  Neuro: Alert and oriented X 3. No focal deficits noted. Psych:  Good affect, responds appropriately Skin: No rashes or lesions noted.  Labs:   Lab Results  Component Value Date   WBC 17.4 (H) 03/25/2016   HGB 6.3 (LL) 03/25/2016   HCT 19.2 (L) 03/25/2016   MCV 65.8 (L) 03/25/2016   PLT 388 03/25/2016   No results for input(s): INR in the last 72 hours.  Recent Labs Lab 03/25/16 0535  NA 130*  K 5.7*  CL 105  CO2 18*  BUN 34*  CREATININE 3.70*  CALCIUM 9.5  PROT 9.1*  BILITOT 1.3*  ALKPHOS 76  ALT 13*  AST 31  GLUCOSE 192*  ALBUMIN 4.7   Magnesium  Date Value Ref Range Status  09/03/2014 2.2 1.5 - 2.5 mg/dL Final    Recent Labs  03/25/16 0800  TROPONINI <0.03    Recent Labs  03/25/16 0544  TROPIPOC 0.01   No results found for: PROBNP   Echo: 07/28/2011 LV EF: 55% -  60% Study Conclusions - Left ventricle: The cavity size was mildly dilated. Wall thickness was increased in a pattern of mild LVH. Systolic function was normal. The estimated ejection fraction was in the range of 55% to 60%. Wall motion was normal; there were no regional wall motion abnormalities. Doppler parameters are consistent with abnormal left ventricular relaxation (grade 1 diastolic dysfunction). - Mitral valve: Calcified annulus. - Left atrium: The atrium was moderately dilated.  ECG: NSR with LBBB and PACs  Radiology:  Dg Chest 2 View  Result Date: 03/25/2016 CLINICAL DATA:  65 year old male with chest pain. Sickle cell anemia. EXAM: CHEST  2 VIEW COMPARISON:  Chest radiograph dated 07/08/2015  FINDINGS: The lungs are clear. There is no pleural effusion or pneumothorax. Linear right lung scarring. There is sclerotic changes of the humeral heads similar to prior radiograph compatible with sickle cell changes. Linear and crescentic lucency through the head of the right humerus may be artifactual. A fracture is not excluded. Correlation with clinical exam recommended. Dedicated right shoulder radiograph may provide better evaluation if clinically indicated. IMPRESSION: No acute cardiopulmonary process. Osseous sclerosis primarily involving the humeral heads compatible with osseous manifestation of sickle cell. Artifact versus nondisplaced right humeral head fracture. Clinical correlation is recommended. Dedicated radiographs of the right shoulder may provide better evaluation if clinically indicated. Electronically Signed   By: Anner Crete M.D.   On: 03/25/2016 06:39    ASSESSMENT AND PLAN:    Active Problems:   Sickle cell anemia (HCC)   Hypertension   Diabetes mellitus (HCC)   Chest pain   Leukocytosis   LBBB (left bundle branch block)   CKD (chronic kidney disease), stage III   Hyperkalemia   AKI (acute kidney injury) ([place])   Cocaine abuse  Chest pain: in the setting cocaine use could be vasospasm; however, troponin normal so far. Would continue to cycle enzymes. ECG with NSR with PACs and LBBB, which is old. He has many RFs for CAD, but likely not a cath candidate 2/2 advanced CKD with creat ~3.7. Also could be related to SS. 2D ECHO pending. Would wait on results of 2D ECHO before deciding on further ischemic eval with a nuclear stress test.   HTN: BP uncontrolled, which could also be contributing to chest pain. Would resume home amlodipine which could also help with spasm. Would not use BB in the setting of recent cocaine use.     Chronic kidney disease stage 4    baseline creatinine 2.5-3    Current Cr 3.7. IM holding diuretic and giving IVFs.   Cocaine/alchohol abuse:   counseled on cessation.   SS disease: per  IM  Signed: Angelena Form, PA-C 03/25/2016 11:13 AM  Pager [phone]  Co-Sign MD

## 2016-03-25 NOTE — ED Provider Notes (Signed)
TIME SEEN: 4:40 AM  CHIEF COMPLAINT: Hypoglycemia, chest pain, cocaine use  HPI: Pt is a 65 y.o. male with history of hypertension, diabetes, renal insufficiency, sickle cell anemia, cocaine abuse who presents to the emergency department with complaints of chest pain. States that he last used cocaine at 10:30 PM last night. States that he was taking a shower and sat down to rest around 3 AM and started having central chest pain without radiation. Describes it as feeling like someone is sitting on his chest. Has chronic shortness of breath that is not changed. Has had nausea but no vomiting. No dizziness or diaphoresis. No aggravating or relieving factors to his pain. States he is not sure if this is a sickle cell crisis as he is also having aching in all of his joints. No fevers or cough. No vomiting or diarrhea. No history of CAD, PE or DVT. Never had a cardiac catheterization. Last stress test was years ago. He is not a smoker.  ROS: See HPI Constitutional: no fever  Eyes: no drainage  ENT: no runny nose   Cardiovascular:   chest pain  Resp: Chronic SOB  GI: no vomiting GU: no dysuria Integumentary: no rash  Allergy: no hives  Musculoskeletal: no leg swelling  Neurological: no slurred speech ROS otherwise negative  PAST MEDICAL HISTORY/PAST SURGICAL HISTORY:  Past Medical History:  Diagnosis Date  . Diabetes mellitus   . Hypertension   . Renal insufficiency   . Sickle cell anemia (HCC)    Hgb SS disease     MEDICATIONS:  Prior to Admission medications   Medication Sig Start Date End Date Taking? Authorizing Provider  acetaminophen (TYLENOL) 325 MG tablet Take 2 tablets (650 mg total) by mouth every 6 (six) hours as needed for mild pain or fever. Patient not taking: Reported on 09/03/2014 04/18/13   Robbie Lis, MD  amLODipine (NORVASC) 10 MG tablet Take 1 tablet (10 mg total) by mouth daily. Patient not taking: Reported on 07/08/2015 09/09/14   Delfina Redwood, MD   amLODipine-benazepril (LOTREL) 10-20 MG capsule Take 1 capsule by mouth daily.    Historical Provider, MD  folic acid (FOLVITE) 1 MG tablet Take 1 tablet (1 mg total) by mouth daily. 04/18/13   Robbie Lis, MD  gabapentin (NEURONTIN) 300 MG capsule Take 300 mg by mouth 3 (three) times daily.    Historical Provider, MD  glipiZIDE (GLUCOTROL XL) 10 MG 24 hr tablet Take 10 mg by mouth 2 (two) times daily.    Historical Provider, MD  hydrochlorothiazide (MICROZIDE) 12.5 MG capsule Take 12.5 mg by mouth daily.    Historical Provider, MD  HYDROcodone-acetaminophen (NORCO/VICODIN) 5-325 MG per tablet Take 1 tablet by mouth every 8 (eight) hours as needed for moderate pain. Patient not taking: Reported on 07/08/2015 09/09/14   Delfina Redwood, MD  labetalol (NORMODYNE) 100 MG tablet Take 1 tablet (100 mg total) by mouth 2 (two) times daily. Patient not taking: Reported on 09/03/2014 04/18/13   Robbie Lis, MD  levofloxacin (LEVAQUIN) 750 MG tablet Take 1 tablet (750 mg total) by mouth every other day. On Friday only Patient not taking: Reported on 07/08/2015 09/11/14   Delfina Redwood, MD  metFORMIN (GLUCOPHAGE) 1000 MG tablet Take 1,000 mg by mouth 2 (two) times daily with a meal.    Historical Provider, MD  omeprazole (PRILOSEC) 20 MG capsule Take 20 mg by mouth daily.  08/12/14   Historical Provider, MD  traMADol (ULTRAM) 50 MG  tablet Take 1 tablet (50 mg total) by mouth every 6 (six) hours as needed. 07/08/15   Dalia Heading, PA-C    ALLERGIES:  Allergies  Allergen Reactions  . Morphine And Related Itching and Swelling    SOCIAL HISTORY:  Social History  Substance Use Topics  . Smoking status: Former Smoker    Packs/day: 0.50    Years: 20.00    Quit date: 06/05/1966  . Smokeless tobacco: Never Used  . Alcohol use Yes     Comment: 1-2 times per week    FAMILY HISTORY: Family History  Problem Relation Age of Onset  . Venous thrombosis Mother     Dec. of blood clot   . Diabetes type  II Mother   . Throat cancer Father     Deceased of throat cancer    EXAM: BP 187/99 (BP Location: Right Arm)   Pulse 74   Temp 98.1 F (36.7 C) (Oral)   Resp 20   SpO2 100%  CONSTITUTIONAL: Alert and oriented and responds appropriately to questions. Chronically ill-appearing, afebrile and nontoxic HEAD: Normocephalic EYES: Conjunctivae clear, PERRL ENT: normal nose; no rhinorrhea; moist mucous membranes NECK: Supple, no meningismus, no LAD  CARD: RRR; S1 and S2 appreciated; no murmurs, no clicks, no rubs, no gallops CHEST:  Chest wall is tender to palpation without crepitus, ecchymosis or deformity. No erythema or warmth. No rash. Palpation of the chest reproduces his pain. RESP: Normal chest excursion without splinting or tachypnea; breath sounds clear and equal bilaterally; no wheezes, no rhonchi, no rales, no hypoxia or respiratory distress, speaking full sentences ABD/GI: Normal bowel sounds; non-distended; soft, non-tender, no rebound, no guarding, no peritoneal signs BACK:  The back appears normal and is non-tender to palpation, there is no CVA tenderness EXT: Normal ROM in all joints; non-tender to palpation; no edema; normal capillary refill; no cyanosis, no calf tenderness or swelling; compartments are soft and nontender, no joint effusion, no erythema or warmth, 2+ radial and DP pulses bilaterally   SKIN: Normal color for age and race; warm; no rash NEURO: Moves all extremities equally, sensation to light touch intact diffusely, cranial nerves II through XII intact PSYCH: The patient's mood and manner are appropriate. Grooming and personal hygiene are appropriate.  MEDICAL DECISION MAKING: Patient here with complaints of chest pain. He does have multiple risk factors for ACS but this seems to be musculoskeletal in nature. He is not tachycardic, tachypneic or hypoxic to suggest PE. Doubt dissection. Will obtain troponin, chest x-ray. We'll give Dilaudid for pain. Have had lengthy  discussion with patient and his ex-wife about his cocaine use. He states he has been using cocaine for many years.  ED PROGRESS: 6:30 AM  Pt's hemoglobin is 6.3. His baseline is between 6 and 8. He was discharged from the hospital in 2016 with a hemoglobin of 6.4. At this time I do not feel he needs to be transfused.  Reticulocyte count is elevated as is his total bilirubin and he has a leukocytosis. I feel he is likely having a sickle cell crisis. Will treat with IV Dilaudid and IV fluids. First troponin is negative. Chest x-ray read is pending. I do not appreciate a pneumothorax, edema or infiltrate. He has chronic kidney disease that appears to have acute on chronic renal failure. BUN is elevated and he has a metabolic acidosis secondary to uremia. His potassium is 5.7 without EKG changes. Will hydrate patient. This time I do not feel he needs IV calcium. Will admit  for hydration and monitoring of his acute renal failure and hyperkalemia. They can rule out ACS with serial troponins as well.   6:45 AM  Pt's chest x-ray shows no acute cardiopulmonary process. There is osteosclerosis of the humeral heads secondary to sickle cell disease. He has artifact versus a nondisplaced right humeral head fracture. He has no bony tenderness over the right shoulder on exam. States he did fall several years ago on to this arm down a hill. No new injury. He has decreased range of motion in his right shoulder which is chronic. Clinically I do not feel he has a right humeral head fracture.   6:55 AM  D/w Dr. Blaine Hamper with hospitalist service. I feel the patient is stable and can wait for the admitting daytime hospitalist team.  He states the patient may need transfer to [facility] long hospital because of his history of sickle cell. He would like for the daytime hospitalist to see the patient to determine if he needs admission at [facility] or [facility] long. He feels the patient would likely need admission to telemetry, inpatient. He  would like for me to hold on placing admission orders or transfer orders at this time. Patient and family have been updated with plan.  I reviewed all nursing notes, vitals, pertinent old records, EKGs, labs, imaging (as available).   EKG Interpretation  Date/Time:  Saturday March 25 2016 04:27:11 EDT Ventricular Rate:  67 PR Interval:    QRS Duration: 153 QT Interval:  425 QTC Calculation: 449 R Axis:   58 Text Interpretation:  Sinus rhythm IVCD, consider atypical LBBB No significant change since last tracing Confirmed by Arin Peral,  DO, Tmya Wigington [phone]) on 03/25/2016 4:34:18 AM          [place], DO 03/25/16 2130

## 2016-03-25 NOTE — ED Notes (Signed)
Admitting at bedside 

## 2016-03-25 NOTE — ED Triage Notes (Addendum)
Patient got up to shower, started hurting in his chest and tested his sugar, the meter read "low".  Patient was given OJ at home.  Patient continues with the chest pain and shortness of breath at this time.  CBG of 229 at this time.  Patient admits to cocaine use tonight.

## 2016-03-25 NOTE — Progress Notes (Signed)
   This is a no charge note  Pending admission per Dr. Leonides Schanz.  65 year old man with a past medical history of sickle cell disease, hypertension, diabetes mellitus, CKD-III, who presents with a chest pain and shortness of breath. Patient used cocaine last night. Troponin negative. EKG showed old left bundle blockage, and T-wave inversion in V5-V6, ST depression in inferior leads. Potassium 5.6 without T-wave change. Worsening renal function. Chest x-rays negative. Patient is accepted to tele bed as inpt. Pt will be held in ED until seen by day team.  Ivor Costa, MD  [facility] Pager [phone]  If 7PM-7AM, please contact night-coverage [email or web address] Password [email or web address] 03/25/2016, 6:59 AM
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
## 2016-03-25 NOTE — ED Notes (Signed)
Attempted report 

## 2016-03-26 ENCOUNTER — Inpatient Hospital Stay (HOSPITAL_COMMUNITY): Payer: Medicare HMO

## 2016-03-26 ENCOUNTER — Inpatient Hospital Stay (HOSPITAL_BASED_OUTPATIENT_CLINIC_OR_DEPARTMENT_OTHER): Payer: Medicare HMO

## 2016-03-26 DIAGNOSIS — R079 Chest pain, unspecified: Secondary | ICD-10-CM | POA: Diagnosis not present

## 2016-03-26 DIAGNOSIS — E875 Hyperkalemia: Secondary | ICD-10-CM

## 2016-03-26 DIAGNOSIS — D72829 Elevated white blood cell count, unspecified: Secondary | ICD-10-CM

## 2016-03-26 DIAGNOSIS — N184 Chronic kidney disease, stage 4 (severe): Secondary | ICD-10-CM

## 2016-03-26 DIAGNOSIS — R0789 Other chest pain: Secondary | ICD-10-CM

## 2016-03-26 DIAGNOSIS — N185 Chronic kidney disease, stage 5: Secondary | ICD-10-CM | POA: Diagnosis present

## 2016-03-26 DIAGNOSIS — E1122 Type 2 diabetes mellitus with diabetic chronic kidney disease: Secondary | ICD-10-CM

## 2016-03-26 LAB — NM MYOCAR MULTI W/SPECT W/WALL MOTION / EF
CHL CUP MPHR: 153 {beats}/min
CHL CUP RESTING HR STRESS: 58 {beats}/min
Estimated workload: 1 METS
Exercise duration (min): 5 min
Peak HR: 93 {beats}/min
Percent HR: 60 %

## 2016-03-26 LAB — BASIC METABOLIC PANEL
ANION GAP: 5 (ref 5–15)
BUN: 31 mg/dL — ABNORMAL HIGH (ref 6–20)
CHLORIDE: 113 mmol/L — AB (ref 101–111)
CO2: 17 mmol/L — AB (ref 22–32)
Calcium: 9.1 mg/dL (ref 8.9–10.3)
Creatinine, Ser: 3.19 mg/dL — ABNORMAL HIGH (ref 0.61–1.24)
GFR calc Af Amer: 22 mL/min — ABNORMAL LOW (ref >60)
GFR calc non Af Amer: 19 mL/min — ABNORMAL LOW (ref >60)
GLUCOSE: 215 mg/dL — AB (ref 65–99)
POTASSIUM: 5.8 mmol/L — AB (ref 3.5–5.1)
Sodium: 135 mmol/L (ref 135–145)

## 2016-03-26 LAB — CBC
HEMATOCRIT: 18.8 % — AB (ref 39.0–52.0)
HEMOGLOBIN: 6.1 g/dL — AB (ref 13.0–17.0)
MCH: 21.5 pg — AB (ref 26.0–34.0)
MCHC: 32.4 g/dL (ref 30.0–36.0)
MCV: 66.2 fL — ABNORMAL LOW (ref 78.0–100.0)
Platelets: 300 10*3/uL (ref 150–400)
RBC: 2.84 MIL/uL — ABNORMAL LOW (ref 4.22–5.81)
RDW: 17.9 % — ABNORMAL HIGH (ref 11.5–15.5)
WBC: 13.8 10*3/uL — ABNORMAL HIGH (ref 4.0–10.5)

## 2016-03-26 LAB — URINE CULTURE: Culture: NO GROWTH

## 2016-03-26 LAB — GLUCOSE, CAPILLARY
GLUCOSE-CAPILLARY: 195 mg/dL — AB (ref 65–99)
Glucose-Capillary: 126 mg/dL — ABNORMAL HIGH (ref 65–99)
Glucose-Capillary: 172 mg/dL — ABNORMAL HIGH (ref 65–99)
Glucose-Capillary: 120 mg/dL — ABNORMAL HIGH (ref 65–99)
Glucose-Capillary: 191 mg/dL — ABNORMAL HIGH (ref 65–99)

## 2016-03-26 MED ORDER — REGADENOSON 0.4 MG/5ML IV SOLN
INTRAVENOUS | Status: AC
Start: 2016-03-26 — End: 2016-03-26
  Filled 2016-03-26: qty 5

## 2016-03-26 MED ORDER — TECHNETIUM TC 99M TETROFOSMIN IV KIT
30.0000 | PACK | Freq: Once | INTRAVENOUS | Status: AC | PRN
  Administered 2016-03-26: 30 via INTRAVENOUS

## 2016-03-26 MED ORDER — HYDROMORPHONE HCL 2 MG PO TABS
2.0000 mg | ORAL_TABLET | ORAL | Status: DC | PRN
  Administered 2016-03-26 – 2016-03-27 (×4): 2 mg via ORAL
  Filled 2016-03-26 (×4): qty 1

## 2016-03-26 MED ORDER — HYDRALAZINE HCL 25 MG PO TABS
25.0000 mg | ORAL_TABLET | Freq: Three times a day (TID) | ORAL | Status: DC
  Administered 2016-03-26 – 2016-03-27 (×3): 25 mg via ORAL
  Filled 2016-03-26 (×3): qty 1

## 2016-03-26 MED ORDER — REGADENOSON 0.4 MG/5ML IV SOLN
0.4000 mg | Freq: Once | INTRAVENOUS | Status: AC
  Administered 2016-03-26: 0.4 mg via INTRAVENOUS
  Filled 2016-03-26: qty 5

## 2016-03-26 MED ORDER — TECHNETIUM TC 99M TETROFOSMIN IV KIT
10.0000 | PACK | Freq: Once | INTRAVENOUS | Status: AC | PRN
  Administered 2016-03-26: 10 via INTRAVENOUS

## 2016-03-26 NOTE — Plan of Care (Signed)
Problem: Phase I Progression Outcomes Goal: Anginal pain relieved Outcome: Completed/Met Date Met: 03/26/16 No complaints of chest pain. Will continue to monitor.   

## 2016-03-26 NOTE — Progress Notes (Addendum)
Progress Note    [name]  OAC:[id number] DOB: 10-Mar-1951  DOA: 03/25/2016 PCP: Philis Fendt, MD    Brief Narrative:   Chief complaint: F/U chest pain  [name] is an 65 y.o. male with a PMH of sickle cell anemia and cocaine abuse who was admitted 03/25/16 with chest pain suspected to be from vasospasm secondary to cocaine use.  Assessment/Plan:   Principal Problem:   Chest pain, likely from coronary vasospasm secondary to cocaine abuse Troponins negative 3. Chest x-ray personally reviewed and interpreted by me: No infiltrates worrisome for acute chest syndrome. EKG this morning personally reviewed. Sinus bradycardia with a left bundle branch block. No ischemic changes. When compared to an EKG done 07/2015, there are no significant changes. 2-D echocardiogram pending. Myoview pending.  Active Problems:   Acute renal failure superimposed on chronic kidney disease/stage IV chronic kidney disease Baseline creatinine 2.5-3. Current Cr 3.7, mildly elevated over usual baseline values. Hydrated overnight. Repeat.    Sickle cell anemia (HCC) Retic 6 %, 177 abs, Hb 6.3, plts normal at 388.    Hypertension Continue Norvasc. Labetalol, hydrochlorothiazide, and Lotrel on hold.    Diabetes mellitus ([place]) with renal complications Currently being managed with insulin sensitive SSI. CBGs [id number].    Leukocytosis Monitor. No evidence of infection.    LBBB (left bundle branch block) Chronic.    Hyperkalemia Hydrated. Repeat potassium.    Family Communication/Anticipated D/C date and plan/Code Status   DVT prophylaxis: Heparin ordered. Code Status: Full Code.  Family Communication: Ex-wife at bedside. Disposition Plan: Home in next 24 hours if myoview negative.   Medical Consultants:    Cardiology.   Procedures:    2 D Echo  Myoview  Anti-Infectives:    None  Subjective:   The patient reports that he has not had any chest pain, but has shoulder  pain, worse on the left. Review of symptoms is negative for dyspnea, nausea/vomiting.  Objective:    Vitals:   03/25/16 1938 03/25/16 2356 03/26/16 0522 03/26/16 0735  BP: (!) 143/86 123/71 (!) 159/76   Pulse: 63 71 71 62  Resp: 13 20 20    Temp: 97.8 F (36.6 C) 97.8 F (36.6 C) 98.6 F (37 C) 98.8 F (37.1 C)  TempSrc: Oral Oral Oral Oral  SpO2: 100% 100% 100%   Weight:   74.8 kg (165 lb)     Intake/Output Summary (Last 24 hours) at 03/26/16 0820 Last data filed at 03/26/16 0600  Gross per 24 hour  Intake          1163.33 ml  Output             2125 ml  Net          -961.67 ml   Filed Weights   03/26/16 0522  Weight: 74.8 kg (165 lb)    Exam: General exam: Appears calm and comfortable.  Respiratory system: Clear to auscultation. Respiratory effort normal. Cardiovascular system: S1 & S2 heard, RRR. No JVD,  rubs, gallops or clicks. No murmurs. Gastrointestinal system: Abdomen is nondistended, soft and nontender. No organomegaly or masses felt. Normal bowel sounds heard. Central nervous system: Alert and oriented. No focal neurological deficits. Extremities: No clubbing,  or cyanosis. No edema. Skin: No rashes, lesions or ulcers. Psychiatry: Judgement and insight appear normal. Mood & affect appropriate.   Data Reviewed:   I have personally reviewed following labs and imaging studies:  Labs: Basic Metabolic Panel:  Recent Labs Lab  03/25/16 0535  NA 130*  K 5.7*  CL 105  CO2 18*  GLUCOSE 192*  BUN 34*  CREATININE 3.70*  CALCIUM 9.5   GFR CrCl cannot be calculated (Unknown ideal weight.). Liver Function Tests:  Recent Labs Lab 03/25/16 0535  AST 31  ALT 13*  ALKPHOS 76  BILITOT 1.3*  PROT 9.1*  ALBUMIN 4.7   CBC:  Recent Labs Lab 03/25/16 0520  WBC 17.4*  HGB 6.3*  HCT 19.2*  MCV 65.8*  PLT 388   Cardiac Enzymes:  Recent Labs Lab 03/25/16 0800 03/25/16 1018 03/25/16 1310  TROPONINI <0.03 <0.03 <0.03   CBG:  Recent Labs Lab  03/25/16 0655 03/25/16 1138 03/25/16 1603 03/25/16 1937 03/26/16 0737  GLUCAP 169* 118* 106* 252* 126*   Anemia work up:  Recent Labs  03/25/16 0520  RETICCTPCT 6.0*   Sepsis Labs:  Recent Labs Lab 03/25/16 0520  WBC 17.4*    Microbiology No results found for this or any previous visit (from the past 240 hour(s)).  Radiology: Dg Chest 2 View  Result Date: 03/26/2016 CLINICAL DATA:  Chest pain EXAM: CHEST  2 VIEW COMPARISON:  03/25/2016 FINDINGS: Cardiac shadow is within normal limits. The lungs are well aerated bilaterally. No focal infiltrate or sizable effusion is seen. Bony changes consistent with the given clinical history of sickle cell are again seen and stable. IMPRESSION: No active cardiopulmonary disease. Electronically Signed   By: Inez Catalina M.D.   On: 03/26/2016 08:16   Dg Chest 2 View  Result Date: 03/25/2016 CLINICAL DATA:  64 year old male with chest pain. Sickle cell anemia. EXAM: CHEST  2 VIEW COMPARISON:  Chest radiograph dated 07/08/2015 FINDINGS: The lungs are clear. There is no pleural effusion or pneumothorax. Linear right lung scarring. There is sclerotic changes of the humeral heads similar to prior radiograph compatible with sickle cell changes. Linear and crescentic lucency through the head of the right humerus may be artifactual. A fracture is not excluded. Correlation with clinical exam recommended. Dedicated right shoulder radiograph may provide better evaluation if clinically indicated. IMPRESSION: No acute cardiopulmonary process. Osseous sclerosis primarily involving the humeral heads compatible with osseous manifestation of sickle cell. Artifact versus nondisplaced right humeral head fracture. Clinical correlation is recommended. Dedicated radiographs of the right shoulder may provide better evaluation if clinically indicated. Electronically Signed   By: Anner Crete M.D.   On: 03/25/2016 06:39    Medications:   . amLODipine  10 mg Oral  Daily  . aspirin EC  325 mg Oral Daily  . pantoprazole  40 mg Oral q morning - 10a   Or  . famotidine (PEPCID) IV  20 mg Intravenous q morning - 15A  . folic acid  1 mg Oral Daily  . heparin subcutaneous  5,000 Units Subcutaneous Q8H  . insulin aspart  0-9 Units Subcutaneous TID WC  . senna-docusate  1 tablet Oral BID   Continuous Infusions: . sodium chloride 100 mL/hr at 03/25/16 2340    Medical decision making is of high complexity and this patient is at high risk of deterioration, therefore this is a level 3 visit.     LOS: 1 day   Kendra Grissett  [facility] Pager [phone]. If unable to reach me by pager, please call my cell phone at [phone].  *Please refer to [email or web address], password [email or web address] to get updated schedule on who will round on this patient, as hospitalists switch teams weekly. If 7PM-7AM, please contact night-coverage at [email or web address], password [email or web address] for any overnight  needs.  03/26/2016, 8:20 AM

## 2016-03-26 NOTE — Progress Notes (Addendum)
Patient Name: [name] Date of Encounter: 03/26/2016  Primary Cardiologist: [facility] Problem List     Principal Problem:   Chest pain Active Problems:   Sickle cell anemia (HCC)   Hypertension   Diabetes mellitus (HCC)   Leukocytosis   LBBB (left bundle branch block)   Hyperkalemia   Cocaine abuse   Acute renal failure superimposed on chronic kidney disease ([place])   Chronic kidney disease (CKD), stage IV (severe) (HCC)     Subjective   Seen in nuc med for stress test - no chest pain  Inpatient Medications    Scheduled Meds: . amLODipine  10 mg Oral Daily  . aspirin EC  325 mg Oral Daily  . pantoprazole  40 mg Oral q morning - 10a   Or  . famotidine (PEPCID) IV  20 mg Intravenous q morning - 16X  . folic acid  1 mg Oral Daily  . heparin subcutaneous  5,000 Units Subcutaneous Q8H  . insulin aspart  0-9 Units Subcutaneous TID WC  . senna-docusate  1 tablet Oral BID   Continuous Infusions: . sodium chloride 100 mL/hr at 03/25/16 2340   PRN Meds: acetaminophen, ALPRAZolam, diphenhydrAMINE, gi cocktail, hydrOXYzine, ondansetron (ZOFRAN) IV, polyethylene glycol, promethazine **OR** promethazine, zolpidem   Vital Signs    Vitals:   03/25/16 1938 03/25/16 2356 03/26/16 0522 03/26/16 0735  BP: (!) 143/86 123/71 (!) 159/76   Pulse: 63 71 71 62  Resp: 13 20 20    Temp: 97.8 F (36.6 C) 97.8 F (36.6 C) 98.6 F (37 C) 98.8 F (37.1 C)  TempSrc: Oral Oral Oral Oral  SpO2: 100% 100% 100%   Weight:   165 lb (74.8 kg)     Intake/Output Summary (Last 24 hours) at 03/26/16 0834 Last data filed at 03/26/16 0600  Gross per 24 hour  Intake          1163.33 ml  Output             2125 ml  Net          -961.67 ml   Filed Weights   03/26/16 0522  Weight: 165 lb (74.8 kg)    Physical Exam   GEN: Well nourished, well developed, in no acute distress. chronically ill appearing HEENT: Grossly normal.  Neck: Supple, no JVD, carotid bruits, or  masses. Cardiac: RRR, no murmurs, rubs, or gallops. No clubbing, cyanosis, edema.  Radials/DP/PT 2+ and equal bilaterally.  Respiratory:  Respirations regular and unlabored, clear to auscultation bilaterally. GI: Soft, nontender, nondistended, BS + x 4. MS: no deformity or atrophy. Skin: warm and dry, no rash. Neuro:  Strength and sensation are intact. Psych: AAOx3.  Normal affect.  Labs    CBC  Recent Labs  03/25/16 0520  WBC 17.4*  HGB 6.3*  HCT 19.2*  MCV 65.8*  PLT 096   Basic Metabolic Panel  Recent Labs  03/25/16 0535  NA 130*  K 5.7*  CL 105  CO2 18*  GLUCOSE 192*  BUN 34*  CREATININE 3.70*  CALCIUM 9.5   Liver Function Tests  Recent Labs  03/25/16 0535  AST 31  ALT 13*  ALKPHOS 76  BILITOT 1.3*  PROT 9.1*  ALBUMIN 4.7   No results for input(s): LIPASE, AMYLASE in the last 72 hours. Cardiac Enzymes  Recent Labs  03/25/16 0800 03/25/16 1018 03/25/16 1310  TROPONINI <0.03 <0.03 <0.03   BNP Invalid input(s): POCBNP D-Dimer No results for input(s): DDIMER in  the last 72 hours. Hemoglobin A1C No results for input(s): HGBA1C in the last 72 hours. Fasting Lipid Panel No results for input(s): CHOL, HDL, LDLCALC, TRIG, CHOLHDL, LDLDIRECT in the last 72 hours. Thyroid Function Tests No results for input(s): TSH, T4TOTAL, T3FREE, THYROIDAB in the last 72 hours.  Invalid input(s): FREET3  Telemetry    NSR with some PACs - Personally Reviewed  ECG    NSR, LBBB - Personally Reviewed  Radiology    Dg Chest 2 View  Result Date: 03/26/2016 CLINICAL DATA:  Chest pain EXAM: CHEST  2 VIEW COMPARISON:  03/25/2016 FINDINGS: Cardiac shadow is within normal limits. The lungs are well aerated bilaterally. No focal infiltrate or sizable effusion is seen. Bony changes consistent with the given clinical history of sickle cell are again seen and stable. IMPRESSION: No active cardiopulmonary disease. Electronically Signed   By: Inez Catalina M.D.   On:  03/26/2016 08:16   Dg Chest 2 View  Result Date: 03/25/2016 CLINICAL DATA:  65 year old male with chest pain. Sickle cell anemia. EXAM: CHEST  2 VIEW COMPARISON:  Chest radiograph dated 07/08/2015 FINDINGS: The lungs are clear. There is no pleural effusion or pneumothorax. Linear right lung scarring. There is sclerotic changes of the humeral heads similar to prior radiograph compatible with sickle cell changes. Linear and crescentic lucency through the head of the right humerus may be artifactual. A fracture is not excluded. Correlation with clinical exam recommended. Dedicated right shoulder radiograph may provide better evaluation if clinically indicated. IMPRESSION: No acute cardiopulmonary process. Osseous sclerosis primarily involving the humeral heads compatible with osseous manifestation of sickle cell. Artifact versus nondisplaced right humeral head fracture. Clinical correlation is recommended. Dedicated radiographs of the right shoulder may provide better evaluation if clinically indicated. Electronically Signed   By: Anner Crete M.D.   On: 03/25/2016 06:39    Cardiac Studies   2D ECHO and myoview pending.  Patient Profile     [name] is a 65 y.o. male with a history of sickle cell anemia, cocaine/alchohol abuse, DMT2, CKD and HTN who presented to [facility] on 03/24/16 with chest pain.   Assessment & Plan   Chest pain: in the setting cocaine use could be vasospasm; however, troponin negx3. ECG with NSR with PACs and LBBB, which is old. He has many RFs for CAD, but likely not a cath candidate 2/2 advanced CKD with creat ~3.7. Also could be related to SS. 2D ECHO pending.Undergoing nuclear stress test today for further risk stratification. Await nuclear images.  HTN: BP uncontrolled.  Home amlodipine 10mg  resumed which could also help with spasm. Would not use BB in the setting of recent cocaine use. Will add hydralazine 25mg  TID   Chronic kidney disease stage 4baseline  creatinine 2.5-3 Current Cr 3.7. IM holding diuretic and giving IVFs.   Cocaine/alchohol abuse:  counseled on cessation.   SS disease: per IM   Signed, Angelena Form, PA-C  03/26/2016, 8:34 AM   Pt. Seen and examined. Agree with the NP/PA-C note as written. Awaiting myoview results. Agree with hydralazine for additional BP control.  Pixie Casino, MD, [name] Attending Cardiologist [place]

## 2016-03-27 ENCOUNTER — Inpatient Hospital Stay (HOSPITAL_COMMUNITY): Payer: Medicare HMO

## 2016-03-27 DIAGNOSIS — R079 Chest pain, unspecified: Secondary | ICD-10-CM

## 2016-03-27 DIAGNOSIS — D5701 Hb-SS disease with acute chest syndrome: Secondary | ICD-10-CM

## 2016-03-27 DIAGNOSIS — R011 Cardiac murmur, unspecified: Secondary | ICD-10-CM

## 2016-03-27 LAB — BASIC METABOLIC PANEL
Anion gap: 5 (ref 5–15)
BUN: 33 mg/dL — ABNORMAL HIGH (ref 6–20)
CO2: 20 mmol/L — ABNORMAL LOW (ref 22–32)
CREATININE: 3.4 mg/dL — AB (ref 0.61–1.24)
Calcium: 9.5 mg/dL (ref 8.9–10.3)
Chloride: 111 mmol/L (ref 101–111)
GFR, EST AFRICAN AMERICAN: 20 mL/min — AB (ref >60)
GFR, EST NON AFRICAN AMERICAN: 17 mL/min — AB (ref >60)
Glucose, Bld: 131 mg/dL — ABNORMAL HIGH (ref 65–99)
POTASSIUM: 5.5 mmol/L — AB (ref 3.5–5.1)
SODIUM: 136 mmol/L (ref 135–145)

## 2016-03-27 LAB — DIFFERENTIAL
BASOS ABS: 0.1 10*3/uL (ref 0.0–0.1)
Basophils Relative: 1 %
EOS PCT: 9 %
Eosinophils Absolute: 1.3 10*3/uL — ABNORMAL HIGH (ref 0.0–0.7)
LYMPHS ABS: 3 10*3/uL (ref 0.7–4.0)
Lymphocytes Relative: 21 %
MONOS PCT: 11 %
Monocytes Absolute: 1.6 10*3/uL — ABNORMAL HIGH (ref 0.1–1.0)
Neutro Abs: 8.5 10*3/uL — ABNORMAL HIGH (ref 1.7–7.7)
Neutrophils Relative %: 58 %

## 2016-03-27 LAB — CBC
HCT: 20.1 % — ABNORMAL LOW (ref 39.0–52.0)
Hemoglobin: 6.3 g/dL — CL (ref 13.0–17.0)
MCH: 20.9 pg — ABNORMAL LOW (ref 26.0–34.0)
MCHC: 31.3 g/dL (ref 30.0–36.0)
MCV: 66.6 fL — AB (ref 78.0–100.0)
PLATELETS: 417 10*3/uL — AB (ref 150–400)
RBC: 3.02 MIL/uL — AB (ref 4.22–5.81)
RDW: 18 % — AB (ref 11.5–15.5)
WBC: 14.5 10*3/uL — AB (ref 4.0–10.5)

## 2016-03-27 LAB — GLUCOSE, CAPILLARY
GLUCOSE-CAPILLARY: 100 mg/dL — AB (ref 65–99)
GLUCOSE-CAPILLARY: 87 mg/dL (ref 65–99)
Glucose-Capillary: 173 mg/dL — ABNORMAL HIGH (ref 65–99)

## 2016-03-27 LAB — ECHOCARDIOGRAM COMPLETE: WEIGHTICAEL: 2630.4 [oz_av]

## 2016-03-27 LAB — RETICULOCYTES
RBC.: 3.02 MIL/uL — AB (ref 4.22–5.81)
RETIC COUNT ABSOLUTE: 172.1 10*3/uL (ref 19.0–186.0)
Retic Ct Pct: 5.7 % — ABNORMAL HIGH (ref 0.4–3.1)

## 2016-03-27 LAB — HEMOGLOBIN A1C
Hgb A1c MFr Bld: <4.2 % — ABNORMAL LOW (ref 4.8–5.6)
Mean Plasma Glucose: <74 mg/dL

## 2016-03-27 MED ORDER — SODIUM CHLORIDE 0.9 % IV SOLN: Freq: Once | INTRAVENOUS | Status: DC

## 2016-03-27 MED ORDER — BLOOD GLUCOSE METER KIT: PACK | 0 refills | Status: DC

## 2016-03-27 MED ORDER — ASPIRIN 325 MG PO TBEC: 325.0000 mg | DELAYED_RELEASE_TABLET | Freq: Every day | ORAL | 0 refills | Status: DC

## 2016-03-27 MED ORDER — HYDRALAZINE HCL 50 MG PO TABS
50.0000 mg | ORAL_TABLET | Freq: Three times a day (TID) | ORAL | Status: DC
  Administered 2016-03-27: 50 mg via ORAL
  Filled 2016-03-27: qty 1

## 2016-03-27 MED ORDER — SODIUM POLYSTYRENE SULFONATE 15 GM/60ML PO SUSP
15.0000 g | Freq: Once | ORAL | Status: AC
  Administered 2016-03-27: 15 g via ORAL
  Filled 2016-03-27: qty 60

## 2016-03-27 NOTE — Progress Notes (Signed)
Rn went in to get blood consent from pt. Pt states he does not wish to have a transfusion at this time. Etta Quill, RN

## 2016-03-27 NOTE — Progress Notes (Signed)
Patient Name: [name] Date of Encounter: 03/27/2016  Primary Cardiologist: Dr. [facility] Problem List     Principal Problem:   Chest pain Active Problems:   Sickle cell anemia (HCC)   Hypertension   Diabetes mellitus (HCC)   Leukocytosis   LBBB (left bundle branch block)   Hyperkalemia   Cocaine abuse   Acute renal failure superimposed on chronic kidney disease (HCC)   Chronic kidney disease (CKD), stage IV (severe) (HCC)    Subjective   Feeling "pretty good." No CP or SOB this morning. Says he's been told he has a heart murmur before.  Inpatient Medications    . amLODipine  10 mg Oral Daily  . aspirin EC  325 mg Oral Daily  . folic acid  1 mg Oral Daily  . heparin subcutaneous  5,000 Units Subcutaneous Q8H  . hydrALAZINE  25 mg Oral Q8H  . insulin aspart  0-9 Units Subcutaneous TID WC  . pantoprazole  40 mg Oral q morning - 10a  . senna-docusate  1 tablet Oral BID    Vital Signs    Vitals:   03/26/16 1819 03/26/16 2024 03/27/16 0549 03/27/16 0908  BP: 119/64 (!) 171/79 (!) 159/76 (!) 141/74  Pulse: 64 70 65 72  Resp: 12 14 16    Temp: 98.2 F (36.8 C) 98.7 F (37.1 C) 98.8 F (37.1 C)   TempSrc: Oral Oral Oral   SpO2: 98% 98% 100%   Weight:   164 lb 6.4 oz (74.6 kg)     Intake/Output Summary (Last 24 hours) at 03/27/16 0932 Last data filed at 03/27/16 0300  Gross per 24 hour  Intake              480 ml  Output             1450 ml  Net             -970 ml   Filed Weights   03/26/16 0522 03/27/16 0549  Weight: 165 lb (74.8 kg) 164 lb 6.4 oz (74.6 kg)    Physical Exam    General: Well developed, well nourished AAM, in no acute distress HEENT: Normocephalic, atraumatic, sclera non-icteric, no xanthomas, nares are without discharge. Neck: Negative for carotid bruits. JVP not elevated. Lungs: Clear bilaterally to auscultation without wheezes, rales, or rhonchi. Breathing is unlabored. Cardiac: RRR S1 S2 with late systolic murmur at  apex 2/6 Abdomen: Soft, non-tender, non-distended with normoactive bowel sounds. No rebound/guarding. Extremities: No clubbing or cyanosis. No edema. Distal pedal pulses are 2+ and equal bilaterally. Skin: Warm and dry, no significant rash. Neuro: Alert and oriented X 3. Strength and sensation in tact. Psych:  Responds to questions appropriately with a normal affect.  Labs    CBC  Recent Labs  03/25/16 0520 03/26/16 1145  WBC 17.4* 13.8*  HGB 6.3* 6.1*  HCT 19.2* 18.8*  MCV 65.8* 66.2*  PLT 388 315   Basic Metabolic Panel  Recent Labs  03/25/16 0535 03/26/16 1145  NA 130* 135  K 5.7* 5.8*  CL 105 113*  CO2 18* 17*  GLUCOSE 192* 215*  BUN 34* 31*  CREATININE 3.70* 3.19*  CALCIUM 9.5 9.1   Liver Function Tests  Recent Labs  03/25/16 0535  AST 31  ALT 13*  ALKPHOS 76  BILITOT 1.3*  PROT 9.1*  ALBUMIN 4.7   No results for input(s): LIPASE, AMYLASE in the last 72 hours. Cardiac Enzymes  Recent Labs  03/25/16 0800  03/25/16 1018 03/25/16 1310  TROPONINI <0.03 <0.03 <0.03   BNP Invalid input(s): POCBNP D-Dimer No results for input(s): DDIMER in the last 72 hours. Hemoglobin A1C  Recent Labs  03/25/16 0520  HGBA1C <4.2*    Telemetry    NSR  Radiology    Dg Chest 2 View  Result Date: 03/26/2016 CLINICAL DATA:  Chest pain EXAM: CHEST  2 VIEW COMPARISON:  03/25/2016 FINDINGS: Cardiac shadow is within normal limits. The lungs are well aerated bilaterally. No focal infiltrate or sizable effusion is seen. Bony changes consistent with the given clinical history of sickle cell are again seen and stable. IMPRESSION: No active cardiopulmonary disease. Electronically Signed   By: Inez Catalina M.D.   On: 03/26/2016 08:16   Dg Chest 2 View  Result Date: 03/25/2016 CLINICAL DATA:  65 year old male with chest pain. Sickle cell anemia. EXAM: CHEST  2 VIEW COMPARISON:  Chest radiograph dated 07/08/2015 FINDINGS: The lungs are clear. There is no pleural  effusion or pneumothorax. Linear right lung scarring. There is sclerotic changes of the humeral heads similar to prior radiograph compatible with sickle cell changes. Linear and crescentic lucency through the head of the right humerus may be artifactual. A fracture is not excluded. Correlation with clinical exam recommended. Dedicated right shoulder radiograph may provide better evaluation if clinically indicated. IMPRESSION: No acute cardiopulmonary process. Osseous sclerosis primarily involving the humeral heads compatible with osseous manifestation of sickle cell. Artifact versus nondisplaced right humeral head fracture. Clinical correlation is recommended. Dedicated radiographs of the right shoulder may provide better evaluation if clinically indicated. Electronically Signed   By: Anner Crete M.D.   On: 03/25/2016 06:39   Nm Myocar Multi W/spect W/wall Motion / Ef  Result Date: 03/26/2016 CLINICAL DATA:  Acute chest pain, sickle cell anemia and diabetes. EXAM: MYOCARDIAL IMAGING WITH SPECT (REST AND PHARMACOLOGIC-STRESS) GATED LEFT VENTRICULAR WALL MOTION STUDY LEFT VENTRICULAR EJECTION FRACTION TECHNIQUE: Standard myocardial SPECT imaging was performed after resting intravenous injection of 10 mCi Tc-105m tetrofosmin. Subsequently, intravenous infusion of Lexiscan was performed under the supervision of the Cardiology staff. At peak effect of the drug, 30 mCi Tc-64m tetrofosmin was injected intravenously and standard myocardial SPECT imaging was performed. Quantitative gated imaging was also performed to evaluate left ventricular wall motion, and estimate left ventricular ejection fraction. COMPARISON:  None. FINDINGS: Perfusion: Large fixed defect of the septum and inferior wall. No significant inducible or reversible ischemia with pharmacologic stress. Wall Motion: Inferior hypokinesis noted and septal dyskinesis. No LV chamber dilatation. Left Ventricular Ejection Fraction: 55 % End diastolic volume  657 ml End systolic volume 74 ml IMPRESSION: 1. Large fixed defect of the septum and inferior walls compatible with remote infarcts/scar. No significant inducible ischemia with pharmacologic stress. 2. Inferior hypokinesis and septal dyskinesis. 3. Left ventricular ejection fraction 55% 4. Non invasive risk stratification*: High *2012 Appropriate Use Criteria for Coronary Revascularization Focused Update: J Am Coll Cardiol. 8469;62(9):528-413. http://content.airportbarriers.com.aspx?articleid=[email or web address] Electronically Signed   By: Jerilynn Mages.  Shick M.D.   On: 03/26/2016 12:13     Patient Profile     65 y.o.malewith a history of sickle cell anemia, cocaine/alchohol abuse, DMT2, CKD stage IV, LBBB, and HTN who presented to [facility] on 03/24/16 with chest pain in setting of recent cocaine use.   Assessment & Plan    1. Chest pain - nuc showing large fixed defects of septum and inferior wall compatible with remote infarct/scar, inferior hypokinesis and septal dyskinesis, EF 55%, no significant inducible ischemia. Will review plan with  MD. Comorbidities make this patient a poor candidate for cath at this time. May be reasonable to continue BP control, abstinence of cocaine, and observe for recurrent anginal sx in OP setting.  2. HTN - labile BP. Continue amlodipine and hydralazine.  3. AKI on CKD stage IV with hyperkalemia - no BMET ordered for this AM. Will place stat order. Further management per IM. Diuretics and ACEI are on hold.  4. Cocaine abuse - cessation advised. No BB due to cocaine use.  5. Sickle cell anemia - further management per IM. Have placed order for f/u CBC this AM as his anemia could have contributed to chest pain.  6. Heart murmur - prior echo 2013 showed calcified mitral annulus. F/u echo pending this admission.  Signed, Charlie Pitter, PA-C  03/27/2016, 9:32 AM

## 2016-03-27 NOTE — Discharge Instructions (Signed)
Aspirin and Your Heart  Aspirin is a medicine that affects the way blood clots. Aspirin can be used to help reduce the risk of blood clots, heart attacks, and other heart-related problems.  SHOULD I TAKE ASPIRIN? Your health care provider will help you determine whether it is safe and beneficial for you to take aspirin daily. Taking aspirin daily may be beneficial if you:  Have had a heart attack or chest pain.  Have undergone open heart surgery such as coronary artery bypass surgery (CABG).  Have had coronary angioplasty.  Have experienced a stroke or transient ischemic attack (TIA).  Have peripheral vascular disease (PVD).  Have chronic heart rhythm problems such as atrial fibrillation. ARE THERE ANY RISKS OF TAKING ASPIRIN DAILY? Daily use of aspirin can increase your risk of side effects. Some of these include:  Bleeding. Bleeding problems can be minor or serious. An example of a minor problem is a cut that does not stop bleeding. An example of a more serious problem is stomach bleeding or bleeding into the brain. Your risk of bleeding is increased if you are also taking non-steroidal anti-inflammatory medicine (NSAIDs).  Increased bruising.  Upset stomach.  An allergic reaction. People who have nasal polyps have an increased risk of developing an aspirin allergy. WHAT ARE SOME GUIDELINES I SHOULD FOLLOW WHEN TAKING ASPIRIN?   Take aspirin only as directed by your health care provider. Make sure you understand how much you should take and what form you should take. The two forms of aspirin are:  Non-enteric-coated. This type of aspirin does not have a coating and is absorbed quickly. Non-enteric-coated aspirin is usually recommended for people with chest pain. This type of aspirin also comes in a chewable form.  Enteric-coated. This type of aspirin has a special coating that releases the medicine very slowly. Enteric-coated aspirin causes less stomach upset than non-enteric-coated  aspirin. This type of aspirin should not be chewed or crushed.  Drink alcohol in moderation. Drinking alcohol increases your risk of bleeding. WHEN SHOULD I SEEK MEDICAL CARE?   You have unusual bleeding or bruising.  You have stomach pain.  You have an allergic reaction. Symptoms of an allergic reaction include:  Hives.  Itchy skin.  Swelling of the lips, tongue, or face.  You have ringing in your ears. WHEN SHOULD I SEEK IMMEDIATE MEDICAL CARE?   Your bowel movements are bloody, dark red, or black in color.  You vomit or cough up blood.  You have blood in your urine.  You cough, wheeze, or feel short of breath. If you have any of the following symptoms, this is an emergency. Do not wait to see if the pain will go away. Get medical help at once. Call your local emergency services (911 in the [place] Do not drive yourself to the hospital.  You have severe chest pain, especially if the pain is crushing or pressure-like and spreads to the arms, back, neck, or jaw.  You have stroke-like symptoms, such as:   Loss of vision.   Difficulty talking.   Numbness or weakness on one side of your body.   Numbness or weakness in your arm or leg.   Not thinking clearly or feeling confused.    This information is not intended to replace advice given to you by your health care provider. Make sure you discuss any questions you have with your health care provider.   Document Released: 05/04/2008 Document Revised: 06/12/2014 Document Reviewed: 08/27/2013 Elsevier Interactive Patient Education 2016 [employer]   Inc.  

## 2016-03-27 NOTE — Progress Notes (Signed)
Echocardiogram 2D Echocardiogram has been performed.  [name] 03/27/2016, 10:31 AM

## 2016-03-27 NOTE — Discharge Summary (Addendum)
Physician Discharge Summary  [name] HKV:[id number] DOB: 05/08/1951 DOA: 03/25/2016  PCP: Philis Fendt, MD  Admit date: 03/25/2016 Discharge date: 03/27/2016  Admitted From: Home Discharge disposition: Home   Recommendations for Outpatient Follow-Up:   1. Please F/U with PCP in 1-2 weeks for a CBC as well as chemistries.   Discharge Diagnosis:   Principal Problem:    Chest pain Active Problems:    Sickle cell anemia (HCC)    Hypertension    Diabetes mellitus (HCC)    Leukocytosis    LBBB (left bundle branch block)    Hyperkalemia    Cocaine abuse    Acute renal failure superimposed on chronic kidney disease (HCC)    Chronic kidney disease (CKD), stage IV (severe) (HCC)    Heart murmur    Discharge Condition: Improved.  Diet recommendation: Low sodium, heart healthy.  Carbohydrate-modified.  Regular.    History of Present Illness:   [name] is an 65 y.o. male with a PMH of sickle cell anemia and cocaine abuse who was admitted 03/25/16 with chest pain suspected to be from vasospasm secondary to cocaine use.  Hospital Course by Problem:   Principal Problem:   Chest pain, likely from coronary vasospasm secondary to cocaine abuse Troponins negative 3. Chest x-ray showed no infiltrates worrisome for acute chest syndrome. F/U EKG showed sinus bradycardia with a left bundle branch block. No ischemic changes. When compared to an EKG done 07/2015, there were no significant changes. 2-D echocardiogram showed good LV function. Myoview abnormal, showing large fixed defects of septum and inferior wall compatible with remote infarct/scar, inferior hypokinesis and septal dyskinesis, EF 55%, no significant inducible ischemia. Cleared for D/C by cardiologist with recommendations to continue blood pressure control, abstinence of cocaine, and close follow-up for any recurrent anginal symptoms in the outpatient setting.  Active Problems:   Acute renal  failure superimposed on chronic kidney disease/stage IV chronic kidney disease Baseline creatinine 2.5-3. Current creatinine back to baseline values after hydration.    Sickle cell anemia (HCC) Hemoglobin 6.1 this morning. Case discussed with Dr. Zigmund Daniel who recommends obtaining a differential and an updated reticulocyte count to see if patient needs PRBCs before d/c. Although she recommended transfusion, the patient declined stating that this hemoglobin is consistent with his usual hemoglobin, and that blood transfusions make him feel sicker.    Hypertension Continue Norvasc. Discontinue labetalol and continue hydrochlorothiazide and Lotrel at discharge.    Diabetes mellitus ([place]) with renal complications Currently being managed with insulin sensitive SSI. CBGs 120-195. Well controlled on glipizide. Hgb A1c <4.3.    Leukocytosis Trending down. No evidence of infection.    LBBB (left bundle branch block) Chronic.    Hyperkalemia Remains elevated despite hydration and improvement in creatinine.  Given a dose of Kayexalate today.   Medical Consultants:    Cardiology   Discharge Exam:   Vitals:   03/27/16 1359 03/27/16 1400  BP: (!) 153/74 (!) 153/74  Pulse:  71  Resp:  (!) 21  Temp:  98.5 F (36.9 C)   Vitals:   03/27/16 0549 03/27/16 0908 03/27/16 1359 03/27/16 1400  BP: (!) 159/76 (!) 141/74 (!) 153/74 (!) 153/74  Pulse: 65 72  71  Resp: 16   (!) 21  Temp: 98.8 F (37.1 C)   98.5 F (36.9 C)  TempSrc: Oral   Oral  SpO2: 100%   94%  Weight: 74.6 kg (164 lb 6.4 oz)       General  exam: Appears calm and comfortable.  Respiratory system: Clear to auscultation. Respiratory effort normal. Cardiovascular system: S1 & S2 heard, RRR. No JVD,  rubs, gallops or clicks. No murmurs. Gastrointestinal system: Abdomen is nondistended, soft and nontender. No organomegaly or masses felt. Normal bowel sounds heard. Central nervous system: Alert and oriented. No focal  neurological deficits. Extremities: No clubbing,  or cyanosis. No edema. Skin: No rashes, lesions or ulcers. Psychiatry: Judgement and insight appear normal. Mood & affect appropriate.    The results of significant diagnostics from this hospitalization (including imaging, microbiology, ancillary and laboratory) are listed below for reference.     Procedures and Diagnostic Studies:   Dg Chest 2 View  Result Date: 03/26/2016 CLINICAL DATA:  Chest pain EXAM: CHEST  2 VIEW COMPARISON:  03/25/2016 FINDINGS: Cardiac shadow is within normal limits. The lungs are well aerated bilaterally. No focal infiltrate or sizable effusion is seen. Bony changes consistent with the given clinical history of sickle cell are again seen and stable. IMPRESSION: No active cardiopulmonary disease. Electronically Signed   By: Inez Catalina M.D.   On: 03/26/2016 08:16   Dg Chest 2 View  Result Date: 03/25/2016 CLINICAL DATA:  65 year old male with chest pain. Sickle cell anemia. EXAM: CHEST  2 VIEW COMPARISON:  Chest radiograph dated 07/08/2015 FINDINGS: The lungs are clear. There is no pleural effusion or pneumothorax. Linear right lung scarring. There is sclerotic changes of the humeral heads similar to prior radiograph compatible with sickle cell changes. Linear and crescentic lucency through the head of the right humerus may be artifactual. A fracture is not excluded. Correlation with clinical exam recommended. Dedicated right shoulder radiograph may provide better evaluation if clinically indicated. IMPRESSION: No acute cardiopulmonary process. Osseous sclerosis primarily involving the humeral heads compatible with osseous manifestation of sickle cell. Artifact versus nondisplaced right humeral head fracture. Clinical correlation is recommended. Dedicated radiographs of the right shoulder may provide better evaluation if clinically indicated. Electronically Signed   By: Anner Crete M.D.   On: 03/25/2016 06:39   Nm  Myocar Multi W/spect W/wall Motion / Ef  Result Date: 03/26/2016 CLINICAL DATA:  Acute chest pain, sickle cell anemia and diabetes. EXAM: MYOCARDIAL IMAGING WITH SPECT (REST AND PHARMACOLOGIC-STRESS) GATED LEFT VENTRICULAR WALL MOTION STUDY LEFT VENTRICULAR EJECTION FRACTION TECHNIQUE: Standard myocardial SPECT imaging was performed after resting intravenous injection of 10 mCi Tc-85mtetrofosmin. Subsequently, intravenous infusion of Lexiscan was performed under the supervision of the Cardiology staff. At peak effect of the drug, 30 mCi Tc-9100metrofosmin was injected intravenously and standard myocardial SPECT imaging was performed. Quantitative gated imaging was also performed to evaluate left ventricular wall motion, and estimate left ventricular ejection fraction. COMPARISON:  None. FINDINGS: Perfusion: Large fixed defect of the septum and inferior wall. No significant inducible or reversible ischemia with pharmacologic stress. Wall Motion: Inferior hypokinesis noted and septal dyskinesis. No LV chamber dilatation. Left Ventricular Ejection Fraction: 55 % End diastolic volume 16409l End systolic volume 74 ml IMPRESSION: 1. Large fixed defect of the septum and inferior walls compatible with remote infarcts/scar. No significant inducible ischemia with pharmacologic stress. 2. Inferior hypokinesis and septal dyskinesis. 3. Left ventricular ejection fraction 55% 4. Non invasive risk stratification*: High *2012 Appropriate Use Criteria for Coronary Revascularization Focused Update: J Am Coll Cardiol. 208119;14(7):829-562http://[email or web address]?[email or web address] Electronically Signed   By: M.Jerilynn Mages Shick M.D.   On: 03/26/2016 12:13   2-D echocardiogram 03/27/16  Study Conclusions  - Left ventricle: The cavity size was normal. Wall thickness was  normal. Systolic function was normal. The estimated ejection   fraction was in the range of 55% to 60%. - Mitral valve: Calcified annulus. Mildly  thickened leaflets .   There was mild regurgitation. - Left atrium: The atrium was moderately dilated. - Pulmonary arteries: PA peak pressure: 34 mm Hg (S). - Pericardium, extracardiac: A trivial pericardial effusion was   identified.  Labs:   Basic Metabolic Panel:  Recent Labs Lab 03/25/16 0535 03/26/16 1145 03/27/16 1217  NA 130* 135 136  K 5.7* 5.8* 5.5*  CL 105 113* 111  CO2 18* 17* 20*  GLUCOSE 192* 215* 131*  BUN 34* 31* 33*  CREATININE 3.70* 3.19* 3.40*  CALCIUM 9.5 9.1 9.5   GFR CrCl cannot be calculated (Unknown ideal weight.). Liver Function Tests:  Recent Labs Lab 03/25/16 0535  AST 31  ALT 13*  ALKPHOS 76  BILITOT 1.3*  PROT 9.1*  ALBUMIN 4.7    CBC:  Recent Labs Lab 03/25/16 0520 03/26/16 1145 03/27/16 1217  WBC 17.4* 13.8* 14.5*  NEUTROABS  --   --  8.5*  HGB 6.3* 6.1* 6.3*  HCT 19.2* 18.8* 20.1*  MCV 65.8* 66.2* 66.6*  PLT 388 300 417*   Cardiac Enzymes:  Recent Labs Lab 03/25/16 0800 03/25/16 1018 03/25/16 1310  TROPONINI <0.03 <0.03 <0.03   BNP: Invalid input(s): POCBNP CBG:  Recent Labs Lab 03/26/16 1628 03/26/16 2229 03/27/16 0745 03/27/16 1116 03/27/16 1623  GLUCAP 120* 191* 100* 173* 87   Hgb A1c  Recent Labs  03/25/16 0520  HGBA1C <4.2*   Anemia work up  Recent Labs  03/25/16 0520 03/27/16 1217  RETICCTPCT 6.0* 5.7*   Microbiology Recent Results (from the past 240 hour(s))  Urine culture     Status: None   Collection Time: 03/25/16  7:26 AM  Result Value Ref Range Status   Specimen Description URINE, RANDOM  Final   Special Requests NONE  Final   Culture NO GROWTH  Final   Report Status 03/26/2016 FINAL  Final  Culture, blood (routine x 2)     Status: None (Preliminary result)   Collection Time: 03/25/16  8:12 AM  Result Value Ref Range Status   Specimen Description BLOOD RIGHT ANTECUBITAL  Final   Special Requests   Final    BOTTLES DRAWN AEROBIC AND ANAEROBIC 6CC AER 3CC ANA   Culture NO  GROWTH 2 DAYS  Final   Report Status PENDING  Incomplete  Culture, blood (routine x 2)     Status: None (Preliminary result)   Collection Time: 03/25/16  8:25 AM  Result Value Ref Range Status   Specimen Description BLOOD RIGHT HAND  Final   Special Requests BOTTLES DRAWN AEROBIC ONLY 3CC  Final   Culture NO GROWTH 2 DAYS  Final   Report Status PENDING  Incomplete     Discharge Instructions:   Discharge Instructions    Call MD for:  extreme fatigue    Complete by:  As directed    Call MD for:  persistant dizziness or light-headedness    Complete by:  As directed    Call MD for:  persistant nausea and vomiting    Complete by:  As directed    Call MD for:  severe uncontrolled pain    Complete by:  As directed    Diet - low sodium heart healthy    Complete by:  As directed    Diet Carb Modified    Complete by:  As directed  Increase activity slowly    Complete by:  As directed        Medication List    STOP taking these medications   acetaminophen 325 MG tablet Commonly known as:  TYLENOL   amLODipine 10 MG tablet Commonly known as:  NORVASC   HYDROcodone-acetaminophen 5-325 MG tablet Commonly known as:  NORCO/VICODIN   labetalol 100 MG tablet Commonly known as:  NORMODYNE   levofloxacin 750 MG tablet Commonly known as:  LEVAQUIN   traMADol 50 MG tablet Commonly known as:  ULTRAM     TAKE these medications   amLODipine-benazepril 10-20 MG capsule Commonly known as:  LOTREL Take 1 capsule by mouth daily.   aspirin 325 MG EC tablet Take 1 tablet (325 mg total) by mouth daily. Start taking on:  03/28/2016   blood glucose meter kit and supplies Dispense based on patient and insurance preference. Use up to four times daily as directed. (FOR ICD-9 250.00, 250.01).   folic acid 1 MG tablet Commonly known as:  FOLVITE Take 1 tablet (1 mg total) by mouth daily.   glipiZIDE 10 MG 24 hr tablet Commonly known as:  GLUCOTROL XL Take 10 mg by mouth 2 (two)  times daily.   hydrochlorothiazide 12.5 MG capsule Commonly known as:  MICROZIDE Take 12.5 mg by mouth daily.   omeprazole 20 MG capsule Commonly known as:  PRILOSEC Take 20 mg by mouth daily.      Follow-up Information    Philis Fendt, MD. Schedule an appointment as soon as possible for a visit today.   Specialty:  Internal Medicine Why:  2 weeks for hospital follow up. Contact information: [address] [place] [address] [phone]            Time coordinating discharge: > 35 minutes.  Signed:  ,  Pager [phone] [facility] 03/27/2016, 5:33 PM

## 2016-03-30 LAB — CULTURE, BLOOD (ROUTINE X 2)
CULTURE: NO GROWTH
Culture: NO GROWTH

## 2016-06-02 ENCOUNTER — Encounter (HOSPITAL_COMMUNITY): Payer: Self-pay | Admitting: Emergency Medicine

## 2016-06-02 ENCOUNTER — Inpatient Hospital Stay (HOSPITAL_COMMUNITY)
Admission: EM | Admit: 2016-06-02 | Discharge: 2016-06-06 | DRG: 641 | Disposition: A | Discharge status: Home / Self Care | Payer: Medicare HMO | Attending: Internal Medicine | Admitting: Internal Medicine

## 2016-06-02 DIAGNOSIS — D571 Sickle-cell disease without crisis: Secondary | ICD-10-CM | POA: Diagnosis present

## 2016-06-02 DIAGNOSIS — Q8901 Asplenia (congenital): Secondary | ICD-10-CM | POA: Diagnosis not present

## 2016-06-02 DIAGNOSIS — N185 Chronic kidney disease, stage 5: Secondary | ICD-10-CM | POA: Diagnosis present

## 2016-06-02 DIAGNOSIS — N184 Chronic kidney disease, stage 4 (severe): Secondary | ICD-10-CM | POA: Diagnosis present

## 2016-06-02 DIAGNOSIS — Z23 Encounter for immunization: Secondary | ICD-10-CM

## 2016-06-02 DIAGNOSIS — D72829 Elevated white blood cell count, unspecified: Secondary | ICD-10-CM | POA: Diagnosis not present

## 2016-06-02 DIAGNOSIS — Z7984 Long term (current) use of oral hypoglycemic drugs: Secondary | ICD-10-CM

## 2016-06-02 DIAGNOSIS — Z79899 Other long term (current) drug therapy: Secondary | ICD-10-CM

## 2016-06-02 DIAGNOSIS — N179 Acute kidney failure, unspecified: Secondary | ICD-10-CM

## 2016-06-02 DIAGNOSIS — Z85038 Personal history of other malignant neoplasm of large intestine: Secondary | ICD-10-CM | POA: Diagnosis not present

## 2016-06-02 DIAGNOSIS — N189 Chronic kidney disease, unspecified: Secondary | ICD-10-CM

## 2016-06-02 DIAGNOSIS — E872 Acidosis: Secondary | ICD-10-CM | POA: Diagnosis present

## 2016-06-02 DIAGNOSIS — F1410 Cocaine abuse, uncomplicated: Secondary | ICD-10-CM | POA: Diagnosis present

## 2016-06-02 DIAGNOSIS — E875 Hyperkalemia: Principal | ICD-10-CM | POA: Diagnosis present

## 2016-06-02 DIAGNOSIS — D631 Anemia in chronic kidney disease: Secondary | ICD-10-CM | POA: Diagnosis present

## 2016-06-02 DIAGNOSIS — T461X5A Adverse effect of calcium-channel blockers, initial encounter: Secondary | ICD-10-CM | POA: Diagnosis present

## 2016-06-02 DIAGNOSIS — R197 Diarrhea, unspecified: Secondary | ICD-10-CM | POA: Diagnosis present

## 2016-06-02 DIAGNOSIS — E1122 Type 2 diabetes mellitus with diabetic chronic kidney disease: Secondary | ICD-10-CM | POA: Diagnosis present

## 2016-06-02 DIAGNOSIS — N2581 Secondary hyperparathyroidism of renal origin: Secondary | ICD-10-CM | POA: Diagnosis present

## 2016-06-02 DIAGNOSIS — Z7982 Long term (current) use of aspirin: Secondary | ICD-10-CM | POA: Diagnosis not present

## 2016-06-02 DIAGNOSIS — Z87891 Personal history of nicotine dependence: Secondary | ICD-10-CM | POA: Diagnosis not present

## 2016-06-02 DIAGNOSIS — I129 Hypertensive chronic kidney disease with stage 1 through stage 4 chronic kidney disease, or unspecified chronic kidney disease: Secondary | ICD-10-CM | POA: Diagnosis present

## 2016-06-02 DIAGNOSIS — E1129 Type 2 diabetes mellitus with other diabetic kidney complication: Secondary | ICD-10-CM

## 2016-06-02 DIAGNOSIS — I10 Essential (primary) hypertension: Secondary | ICD-10-CM | POA: Diagnosis present

## 2016-06-02 DIAGNOSIS — R52 Pain, unspecified: Secondary | ICD-10-CM | POA: Diagnosis present

## 2016-06-02 LAB — CBC
HCT: 23.5 % — ABNORMAL LOW (ref 39.0–52.0)
Hemoglobin: 7.6 g/dL — ABNORMAL LOW (ref 13.0–17.0)
MCH: 21.6 pg — AB (ref 26.0–34.0)
MCHC: 32.3 g/dL (ref 30.0–36.0)
MCV: 66.8 fL — ABNORMAL LOW (ref 78.0–100.0)
PLATELETS: 348 10*3/uL (ref 150–400)
RBC: 3.52 MIL/uL — ABNORMAL LOW (ref 4.22–5.81)
RDW: 15.9 % — AB (ref 11.5–15.5)
WBC: 13 10*3/uL — ABNORMAL HIGH (ref 4.0–10.5)

## 2016-06-02 LAB — LIPASE, BLOOD: Lipase: 54 U/L — ABNORMAL HIGH (ref 11–51)

## 2016-06-02 LAB — COMPREHENSIVE METABOLIC PANEL
ALBUMIN: 4.8 g/dL (ref 3.5–5.0)
ALK PHOS: 71 U/L (ref 38–126)
ALT: 13 U/L — AB (ref 17–63)
ANION GAP: 7 (ref 5–15)
AST: 25 U/L (ref 15–41)
BILIRUBIN TOTAL: 0.9 mg/dL (ref 0.3–1.2)
BUN: 33 mg/dL — AB (ref 6–20)
CALCIUM: 9.8 mg/dL (ref 8.9–10.3)
CO2: 21 mmol/L — AB (ref 22–32)
CREATININE: 3.93 mg/dL — AB (ref 0.61–1.24)
Chloride: 109 mmol/L (ref 101–111)
GFR calc Af Amer: 17 mL/min — ABNORMAL LOW (ref >60)
GFR calc non Af Amer: 14 mL/min — ABNORMAL LOW (ref >60)
GLUCOSE: 118 mg/dL — AB (ref 65–99)
Potassium: 6.4 mmol/L (ref 3.5–5.1)
Sodium: 137 mmol/L (ref 135–145)
TOTAL PROTEIN: 8.4 g/dL — AB (ref 6.5–8.1)

## 2016-06-02 LAB — URINALYSIS, ROUTINE W REFLEX MICROSCOPIC
Bacteria, UA: NONE SEEN
Bilirubin Urine: NEGATIVE
GLUCOSE, UA: 150 mg/dL — AB
Ketones, ur: NEGATIVE mg/dL
Leukocytes, UA: NEGATIVE
NITRITE: NEGATIVE
PROTEIN: NEGATIVE mg/dL
RBC / HPF: NONE SEEN RBC/hpf (ref 0–5)
SPECIFIC GRAVITY, URINE: 1.009 (ref 1.005–1.030)
Squamous Epithelial / LPF: NONE SEEN
pH: 5 (ref 5.0–8.0)

## 2016-06-02 LAB — CK: Total CK: 102 U/L (ref 49–397)

## 2016-06-02 LAB — POTASSIUM: Potassium: 6.5 mmol/L (ref 3.5–5.1)

## 2016-06-02 MED ORDER — SODIUM BICARBONATE 8.4 % IV SOLN
50.0000 meq | Freq: Once | INTRAVENOUS | Status: AC
  Administered 2016-06-02: 50 meq via INTRAVENOUS
  Filled 2016-06-02: qty 50

## 2016-06-02 MED ORDER — AMLODIPINE BESYLATE 5 MG PO TABS
10.0000 mg | ORAL_TABLET | Freq: Every day | ORAL | Status: DC
  Filled 2016-06-02: qty 2

## 2016-06-02 MED ORDER — FOLIC ACID 1 MG PO TABS
1.0000 mg | ORAL_TABLET | Freq: Every day | ORAL | Status: DC
  Administered 2016-06-03 – 2016-06-06 (×4): 1 mg via ORAL
  Filled 2016-06-02 (×4): qty 1

## 2016-06-02 MED ORDER — SODIUM POLYSTYRENE SULFONATE 15 GM/60ML PO SUSP
30.0000 g | Freq: Once | ORAL | Status: AC
  Administered 2016-06-02: 30 g via ORAL
  Filled 2016-06-02: qty 120

## 2016-06-02 MED ORDER — SODIUM CHLORIDE 0.9 % IV BOLUS (SEPSIS)
1000.0000 mL | Freq: Once | INTRAVENOUS | Status: AC
  Administered 2016-06-02: 1000 mL via INTRAVENOUS

## 2016-06-02 MED ORDER — SODIUM POLYSTYRENE SULFONATE 15 GM/60ML PO SUSP
60.0000 g | Freq: Once | ORAL | Status: AC
  Administered 2016-06-03: 60 g via ORAL
  Filled 2016-06-02: qty 240

## 2016-06-02 MED ORDER — SODIUM CHLORIDE 0.9 % IV SOLN
1.0000 g | Freq: Once | INTRAVENOUS | Status: AC
  Administered 2016-06-02: 1 g via INTRAVENOUS
  Filled 2016-06-02: qty 10

## 2016-06-02 MED ORDER — DEXTROSE 50 % IV SOLN
1.0000 | Freq: Once | INTRAVENOUS | Status: AC
  Administered 2016-06-02: 50 mL via INTRAVENOUS
  Filled 2016-06-02: qty 50

## 2016-06-02 MED ORDER — GABAPENTIN 300 MG PO CAPS
300.0000 mg | ORAL_CAPSULE | Freq: Three times a day (TID) | ORAL | Status: DC
  Administered 2016-06-03 – 2016-06-05 (×8): 300 mg via ORAL
  Filled 2016-06-02 (×9): qty 1

## 2016-06-02 MED ORDER — AMLODIPINE BESYLATE 10 MG PO TABS
10.0000 mg | ORAL_TABLET | Freq: Every day | ORAL | Status: DC
  Administered 2016-06-03 – 2016-06-06 (×4): 10 mg via ORAL
  Filled 2016-06-02 (×4): qty 1

## 2016-06-02 MED ORDER — INSULIN ASPART 100 UNIT/ML ~~LOC~~ SOLN
0.0000 [IU] | Freq: Three times a day (TID) | SUBCUTANEOUS | Status: DC
  Administered 2016-06-03: 1 [IU] via SUBCUTANEOUS
  Administered 2016-06-03: 2 [IU] via SUBCUTANEOUS
  Administered 2016-06-04: 1 [IU] via SUBCUTANEOUS
  Administered 2016-06-04: 3 [IU] via SUBCUTANEOUS
  Administered 2016-06-05: 2 [IU] via SUBCUTANEOUS
  Administered 2016-06-05: 3 [IU] via SUBCUTANEOUS
  Administered 2016-06-06: 2 [IU] via SUBCUTANEOUS
  Administered 2016-06-06: 1 [IU] via SUBCUTANEOUS

## 2016-06-02 MED ORDER — INSULIN ASPART 100 UNIT/ML ~~LOC~~ SOLN
5.0000 [IU] | Freq: Once | SUBCUTANEOUS | Status: AC
  Administered 2016-06-02: 5 [IU] via INTRAVENOUS
  Filled 2016-06-02: qty 1

## 2016-06-02 MED ORDER — PANTOPRAZOLE SODIUM 40 MG PO TBEC
40.0000 mg | DELAYED_RELEASE_TABLET | Freq: Every day | ORAL | Status: DC
  Administered 2016-06-03 – 2016-06-06 (×4): 40 mg via ORAL
  Filled 2016-06-02 (×4): qty 1

## 2016-06-02 MED ORDER — HEPARIN SODIUM (PORCINE) 5000 UNIT/ML IJ SOLN
5000.0000 [IU] | Freq: Three times a day (TID) | INTRAMUSCULAR | Status: DC
  Administered 2016-06-03 – 2016-06-06 (×8): 5000 [IU] via SUBCUTANEOUS
  Filled 2016-06-02 (×8): qty 1

## 2016-06-02 NOTE — ED Triage Notes (Signed)
Per EMS pt complaint of generalized body aches with diarrhea and decreased appetite.
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
## 2016-06-02 NOTE — H&P (Signed)
History and Physical    [name] ATF:[id number] DOB: 1950/12/06 DOA: 06/02/2016   PCP: Philis Fendt, MD Chief Complaint:  Chief Complaint  Patient presents with  . Generalized Body Aches    HPI: [name] is a 65 y.o. male with medical history significant of DM, HTN, sickle cell disease, cocaine abuse which he admits is ongoing despite recent admission for CP back in October.  At baseline lab work demonstrates progressively worsening renal function for the past several years.  He admits that he was first told he needed to be following with a nephrologist "about a year ago", but has never seen one.  Creat back in October was ~3.4.  K during that admit was running about 5.4.  Today he presents to the ED with c/o generalized body aches, generalized muscle weakness.  Symptoms gradually worsening since onset yesterday.  No fever, chills.  He does admit to poor appetite, nausea, diarrhea for the last 3 days (4-5 loose BMs / Day).  ED Course: Work up in the ED is most remarkable for potassium of 6.4 (confirmed to 6.5 on repeat) Creat now up to 3.9, BUN 33.  Review of Systems: As per HPI otherwise 10 point review of systems negative.    Past Medical History:  Diagnosis Date  . Diabetes mellitus   . Hypertension   . Renal insufficiency   . Sickle cell anemia (HCC)    Hgb SS disease     Past Surgical History:  Procedure Laterality Date  . CHOLECYSTECTOMY    . COLON SURGERY     Colon cancer surgery, removed small amt not full colectomy  . COLONOSCOPY     nclear who follows him for this  . LEG SURGERY     For ? gangrene after running into barbwire fence at 65yo     reports that he quit smoking about 50 years ago. He has a 10.00 pack-year smoking history. He has never used smokeless tobacco. He reports that he drinks alcohol. He reports that he uses drugs, including Cocaine.  Allergies  Allergen Reactions  . Morphine And Related Itching    Family History  Problem  Relation Age of Onset  . Venous thrombosis Mother     Dec. of blood clot   . Diabetes type II Mother   . Throat cancer Father     Deceased of throat cancer      Prior to Admission medications   Medication Sig Start Date End Date Taking? Authorizing Provider  amLODipine-benazepril (LOTREL) 10-20 MG capsule Take 1 capsule by mouth daily.   Yes Historical Provider, MD  aspirin EC 325 MG tablet Take 325 mg by mouth daily.   Yes Historical Provider, MD  folic acid (FOLVITE) 1 MG tablet Take 1 tablet (1 mg total) by mouth daily. 04/18/13  Yes Robbie Lis, MD  gabapentin (NEURONTIN) 300 MG capsule Take 300 mg by mouth 3 (three) times daily.   Yes Historical Provider, MD  glipiZIDE (GLUCOTROL XL) 10 MG 24 hr tablet Take 10 mg by mouth 2 (two) times daily.   Yes Historical Provider, MD  hydrochlorothiazide (MICROZIDE) 12.5 MG capsule Take 12.5 mg by mouth daily.   Yes Historical Provider, MD  omeprazole (PRILOSEC) 20 MG capsule Take 20 mg by mouth daily.    Yes Historical Provider, MD    Physical Exam: Vitals:   06/02/16 1945 06/02/16 2033 06/02/16 2116 06/02/16 2219  BP: 138/78 142/79 137/81 171/87  Pulse: 66 (!) 55 61 72  Resp: 18 18 14 20   Temp:      TempSrc:      SpO2: 97% 100% 98% 100%  Weight:          Constitutional: NAD, calm, comfortable Eyes: PERRL, lids and conjunctivae normal ENMT: Mucous membranes are moist. Posterior pharynx clear of any exudate or lesions.Normal dentition.  Neck: normal, supple, no masses, no thyromegaly Respiratory: clear to auscultation bilaterally, no wheezing, no crackles. Normal respiratory effort. No accessory muscle use.  Cardiovascular: Regular rate and rhythm, no murmurs / rubs / gallops. No extremity edema. 2+ pedal pulses. No carotid bruits.  Abdomen: no tenderness, no masses palpated. No hepatosplenomegaly. Bowel sounds positive.  Musculoskeletal: no clubbing / cyanosis. No joint deformity upper and lower extremities. Good ROM, no  contractures. Normal muscle tone.  Skin: no rashes, lesions, ulcers. No induration Neurologic: CN 2-12 grossly intact. Sensation intact, DTR normal. Strength 5/5 in all 4.  Psychiatric: Normal judgment and insight. Alert and oriented x 3. Normal mood.    Labs on Admission: I have personally reviewed following labs and imaging studies  CBC:  Recent Labs Lab 06/02/16 1814  WBC 13.0*  HGB 7.6*  HCT 23.5*  MCV 66.8*  PLT 825   Basic Metabolic Panel:  Recent Labs Lab 06/02/16 1814 06/02/16 2144  NA 137  --   K 6.4* 6.5*  CL 109  --   CO2 21*  --   GLUCOSE 118*  --   BUN 33*  --   CREATININE 3.93*  --   CALCIUM 9.8  --    GFR: CrCl cannot be calculated (Unknown ideal weight.). Liver Function Tests:  Recent Labs Lab 06/02/16 1814  AST 25  ALT 13*  ALKPHOS 71  BILITOT 0.9  PROT 8.4*  ALBUMIN 4.8    Recent Labs Lab 06/02/16 1814  LIPASE 54*   No results for input(s): AMMONIA in the last 168 hours. Coagulation Profile: No results for input(s): INR, PROTIME in the last 168 hours. Cardiac Enzymes:  Recent Labs Lab 06/02/16 1814  CKTOTAL 102   BNP (last 3 results) No results for input(s): PROBNP in the last 8760 hours. HbA1C: No results for input(s): HGBA1C in the last 72 hours. CBG: No results for input(s): GLUCAP in the last 168 hours. Lipid Profile: No results for input(s): CHOL, HDL, LDLCALC, TRIG, CHOLHDL, LDLDIRECT in the last 72 hours. Thyroid Function Tests: No results for input(s): TSH, T4TOTAL, FREET4, T3FREE, THYROIDAB in the last 72 hours. Anemia Panel: No results for input(s): VITAMINB12, FOLATE, FERRITIN, TIBC, IRON, RETICCTPCT in the last 72 hours. Urine analysis:    Component Value Date/Time   COLORURINE YELLOW 06/02/2016 1739   APPEARANCEUR CLEAR 06/02/2016 1739   LABSPEC 1.009 06/02/2016 1739   PHURINE 5.0 06/02/2016 1739   GLUCOSEU 150 (A) 06/02/2016 1739   HGBUR SMALL (A) 06/02/2016 1739   BILIRUBINUR NEGATIVE 06/02/2016 1739    KETONESUR NEGATIVE 06/02/2016 1739   PROTEINUR NEGATIVE 06/02/2016 1739   UROBILINOGEN 1.0 09/06/2014 2203   NITRITE NEGATIVE 06/02/2016 1739   LEUKOCYTESUR NEGATIVE 06/02/2016 1739   Sepsis Labs: @LABRCNTIP (procalcitonin:4,lacticidven:4) )No results found for this or any previous visit (from the past 240 hour(s)).   Radiological Exams on Admission: No results found.  EKG: Independently reviewed.  Assessment/Plan Principal Problem:   Hyperkalemia, diminished renal excretion Active Problems:   Hypertension   Diabetes mellitus ([place])   Cocaine abuse   Hb-SS disease without crisis ([place])   Chronic kidney disease (CKD), stage IV (severe) ([place])  1. Hyperkalemia - 1. Temp measures performed in ED 2. Repeat BMP at 0500 3. STOP ACEi 4. Spoke with Dr. Jimmy Footman: 1. Repeat kayexelate in 4 hours 2. Dont give too much fluid at this point, 1L wont hurt 5. Tele monitor 2. HTN - 1. STOP ACEi 2. Holding HCTZ 3. Continue amlodipine 3. CKD stage 4 - 1. Nephrology to see in consult in AM (spoke with Dr. Jimmy Footman) 4. Cocaine abuse - 1. Ongoing 2. Patient is honest about this and at least recognizes that this is contributing to health problems 5. Hb-SS disease without crisis   DVT prophylaxis: Heparin [place] Code Status: Full Family Communication: Wife and daughter at bedside, patient makes clear that we can discuss absolutely everything (including drug use) with Wife. Consults called: Spoke with Dr. Jimmy Footman who will see in consult tomorrow Admission status: Admit to inpatient at [facility] [place] Hospitalists Pager [phone] from 7PM-7AM  If 7AM-7PM, please contact the day physician for the patient [email or web address] Password [email or web address]  06/02/2016, 10:59 PM

## 2016-06-02 NOTE — ED Notes (Signed)
[facility] was notified of pt's need of transportation to [facility]. 

## 2016-06-03 ENCOUNTER — Encounter (HOSPITAL_COMMUNITY): Payer: Self-pay | Admitting: *Deleted

## 2016-06-03 DIAGNOSIS — F1410 Cocaine abuse, uncomplicated: Secondary | ICD-10-CM

## 2016-06-03 LAB — CBC
HEMATOCRIT: 19 % — AB (ref 39.0–52.0)
Hemoglobin: 6.1 g/dL — CL (ref 13.0–17.0)
MCH: 21.9 pg — AB (ref 26.0–34.0)
MCHC: 32.1 g/dL (ref 30.0–36.0)
MCV: 68.1 fL — AB (ref 78.0–100.0)
PLATELETS: 284 10*3/uL (ref 150–400)
RBC: 2.79 MIL/uL — AB (ref 4.22–5.81)
RDW: 16.2 % — ABNORMAL HIGH (ref 11.5–15.5)
WBC: 10.7 10*3/uL — AB (ref 4.0–10.5)

## 2016-06-03 LAB — GLUCOSE, CAPILLARY
GLUCOSE-CAPILLARY: 221 mg/dL — AB (ref 65–99)
GLUCOSE-CAPILLARY: 154 mg/dL — AB (ref 65–99)
GLUCOSE-CAPILLARY: 165 mg/dL — AB (ref 65–99)
Glucose-Capillary: 89 mg/dL (ref 65–99)
Glucose-Capillary: 142 mg/dL — ABNORMAL HIGH (ref 65–99)

## 2016-06-03 LAB — TYPE AND SCREEN
ABO/RH(D): AB POS
ANTIBODY SCREEN: NEGATIVE

## 2016-06-03 LAB — BASIC METABOLIC PANEL
Anion gap: 7 (ref 5–15)
BUN: 27 mg/dL — AB (ref 6–20)
CHLORIDE: 110 mmol/L (ref 101–111)
CO2: 22 mmol/L (ref 22–32)
CREATININE: 3.68 mg/dL — AB (ref 0.61–1.24)
Calcium: 9.3 mg/dL (ref 8.9–10.3)
GFR calc Af Amer: 18 mL/min — ABNORMAL LOW (ref >60)
GFR calc non Af Amer: 16 mL/min — ABNORMAL LOW (ref >60)
Glucose, Bld: 193 mg/dL — ABNORMAL HIGH (ref 65–99)
POTASSIUM: 5.2 mmol/L — AB (ref 3.5–5.1)
Sodium: 139 mmol/L (ref 135–145)

## 2016-06-03 LAB — FERRITIN: Ferritin: 170 ng/mL (ref 24–336)

## 2016-06-03 LAB — IRON AND TIBC
Iron: 92 ug/dL (ref 45–182)
SATURATION RATIOS: 31 % (ref 17.9–39.5)
TIBC: 298 ug/dL (ref 250–450)
UIBC: 206 ug/dL

## 2016-06-03 MED ORDER — CAMPHOR-MENTHOL 0.5-0.5 % EX LOTN
TOPICAL_LOTION | CUTANEOUS | Status: DC | PRN
  Administered 2016-06-04: 10:00:00 via TOPICAL
  Filled 2016-06-03 (×2): qty 222

## 2016-06-03 MED ORDER — INFLUENZA VAC SPLIT QUAD 0.5 ML IM SUSY
0.5000 mL | PREFILLED_SYRINGE | INTRAMUSCULAR | Status: AC
  Administered 2016-06-04: 0.5 mL via INTRAMUSCULAR
  Filled 2016-06-03: qty 0.5

## 2016-06-03 MED ORDER — ACETAMINOPHEN 325 MG PO TABS
650.0000 mg | ORAL_TABLET | Freq: Four times a day (QID) | ORAL | Status: DC | PRN
  Administered 2016-06-03: 650 mg via ORAL
  Filled 2016-06-03: qty 2

## 2016-06-03 MED ORDER — FUROSEMIDE 40 MG PO TABS
40.0000 mg | ORAL_TABLET | Freq: Every day | ORAL | Status: DC
Start: 2016-06-03 — End: 2016-06-06
  Administered 2016-06-03 – 2016-06-06 (×4): 40 mg via ORAL
  Filled 2016-06-03 (×4): qty 1

## 2016-06-03 MED ORDER — DIPHENHYDRAMINE HCL 25 MG PO CAPS
25.0000 mg | ORAL_CAPSULE | Freq: Three times a day (TID) | ORAL | Status: DC | PRN
  Administered 2016-06-03 – 2016-06-06 (×4): 25 mg via ORAL
  Filled 2016-06-03 (×4): qty 1

## 2016-06-03 MED ORDER — OXYCODONE-ACETAMINOPHEN 5-325 MG PO TABS
1.0000 | ORAL_TABLET | ORAL | Status: DC | PRN
  Administered 2016-06-03 – 2016-06-06 (×6): 1 via ORAL
  Filled 2016-06-03 (×6): qty 1

## 2016-06-03 MED ORDER — OXYCODONE-ACETAMINOPHEN 5-325 MG PO TABS
1.0000 | ORAL_TABLET | Freq: Once | ORAL | Status: AC
  Administered 2016-06-03: 1 via ORAL
  Filled 2016-06-03: qty 1

## 2016-06-03 NOTE — Consult Note (Signed)
[name] is an 65 y.o. male referred by [name]   Chief Complaint: CKD 4, anemia, hyperkalemia HPI: 65yo BM admitted after presenting to ER with weakness and found to have K of 6.4.  Has CKD 4 most likely due to HTN (+/- DM and SS) and hx of ARF with Scr 2-3 range going back to 2016.  Has seen [name] and [name] in the hospital in 2016 but has had no outpt FU.  Renal US 3/16 showed bil echogenic kidneys.  Says his UO is good.   Drinks fair amt of OJ to manage low BS.   He is unable to tell me his meds.   Past Medical History:  Diagnosis Date  . Diabetes mellitus   . Hypertension   . Renal insufficiency   . Sickle cell anemia (HCC)    Hgb SS disease     Past Surgical History:  Procedure Laterality Date  . CHOLECYSTECTOMY    . COLON SURGERY     Colon cancer surgery, removed small amt not full colectomy  . COLONOSCOPY     nclear who follows him for this  . LEG SURGERY     For ? gangrene after running into barbwire fence at 65yo    Family History  Problem Relation Age of Onset  . Venous thrombosis Mother     Dec. of blood clot   . Diabetes type II Mother   . Throat cancer Father     Deceased of throat cancer   Social History:  reports that he quit smoking about 50 years ago. He has a 10.00 pack-year smoking history. He has never used smokeless tobacco. He reports that he drinks alcohol. He reports that he uses drugs, including Cocaine.  Allergies:  Allergies  Allergen Reactions  . Morphine And Related Itching    Medications Prior to Admission  Medication Sig Dispense Refill  . amLODipine-benazepril (LOTREL) 10-20 MG capsule Take 1 capsule by mouth daily.    Marland Kitchen aspirin EC 325 MG tablet Take 325 mg by mouth daily.    . folic acid (FOLVITE) 1 MG tablet Take 1 tablet (1 mg total) by mouth daily. 30 tablet 0  . gabapentin (NEURONTIN) 300 MG capsule Take 300 mg by mouth 3 (three) times daily.    Marland Kitchen glipiZIDE (GLUCOTROL XL) 10 MG 24 hr tablet Take 10 mg by mouth 2 (two) times  daily.    . hydrochlorothiazide (MICROZIDE) 12.5 MG capsule Take 12.5 mg by mouth daily.    Marland Kitchen omeprazole (PRILOSEC) 20 MG capsule Take 20 mg by mouth daily.   5     Lab Results: UA: No protein, benign micro  Recent Labs  06/02/16 1814 06/03/16 0924  WBC 13.0* 10.7*  HGB 7.6* 6.1*  HCT 23.5* 19.0*  PLT 348 284   BMET  Recent Labs  06/02/16 1814 06/02/16 2144 06/03/16 0924  NA 137  --  139  K 6.4* 6.5* 5.2*  CL 109  --  110  CO2 21*  --  22  GLUCOSE 118*  --  193*  BUN 33*  --  27*  CREATININE 3.93*  --  3.68*  CALCIUM 9.8  --  9.3   LFT  Recent Labs  06/02/16 1814  PROT 8.4*  ALBUMIN 4.8  AST 25  ALT 13*  ALKPHOS 71  BILITOT 0.9   No results found.  ROS: Appetite fair No SOB No CP Cramping abd pain RLQ assoc with loose stools + numbness/tingling in hands and  feet No dysuria  PHYSICAL EXAM: Blood pressure 134/71, pulse 70, temperature 99.1 F (37.3 C), temperature source Oral, resp. rate 16, height _0  (1.854 m), weight 74.4 kg (164 lb), SpO2 98 %. HEENT: PERRLA EOMI NECK:No JVD LUNGS:Clear CARDIAC:RRR w 2/6 systolic M LSB ABD:+ BS ND Mild RLQ tenderness.  No HSM.  Bladder not palp EXT:No edema NEURO:CNI, decrease sensation feet, Ox3 no asterixis  Assessment: 1. CKD 4 most likely secondary to HTN +/- DM/Sickle Cell Ds 2. Anemia  Sec SS +/- CKD 3. HTN 4. DM 5. Met acidosis 6. Hyperkalemia secondary to CKD and ACE  PLAN: 1. Daily Scr and K 2. Dietician to see for low K diet 3. Check PTH 4. Start low lasix to help with K excretion 5. Check PO4 6. Check SPEP 7. Check iron studies.  ? If ESA will help in setting of his SS but will first check iron stores 8. Discussed the fact that renal fx only about 20% or less of Nl and he needs to start taking care of himself if he wants to delay HD as long as possible 9. Leave off ACE/ARB as risk outweighs benefit   [name] T 06/03/2016, 11:09 AM

## 2016-06-03 NOTE — Progress Notes (Signed)
Patient ID: [name], male   DOB: [birth year], 65 y.o.   MRN: [id number] Subjective: [name] is a 65 y.o. male with medical history significant for DM, HTN, sickle cell disease, cocaine abuse which he admits is ongoing despite recent admission for CP back in October.  At baseline lab work demonstrates progressively worsening renal function for the past several years. He admits that he was first told he needed to be following with a nephrologist "about a year ago", but has never seen one. Creat back in October was ~3.4.  K during that admit was running about 5.4. He presented to the ED with c/o generalized body aches, generalized muscle weakness. Symptoms gradually worsening since onset yesterday. No fever, chills. He does admit to poor appetite, nausea, diarrhea for the last 3 days (4-5 loose BMs / Day).  Due to increasing serum K in the setting of CKD with worsening function, patient was transferred from [facility] hospital to [facility] Renal Unit and Nephrology consulted.   Patient has no new complaint today, still has diarrhea but no nausea or vomiting, abdomen is bloated with cramping. No fever, no chest pain, no shortness of breath.    Objective:  Vital signs in last 24 hours:  Vitals:   06/03/16 0122 06/03/16 0330 06/03/16 0435 06/03/16 0924  BP: (!) 166/90  (!) 158/87 134/71  Pulse: 84  78 70  Resp: 16  14 16   Temp: 99.4 F (37.4 C)  98.4 F (36.9 C) 99.1 F (37.3 C)  TempSrc: Oral   Oral  SpO2: 98%  99% 98%  Weight:      Height:  6\' 1"  (1.854 m)     Intake/Output from previous day:   Intake/Output Summary (Last 24 hours) at 06/03/16 1537 Last data filed at 06/03/16 1330  Gross per 24 hour  Intake             1580 ml  Output              275 ml  Net             1305 ml   Physical Exam: General: Alert, awake, oriented x3, in no acute distress.  HEENT: [place]/AT PEERL, EOMI Neck: Trachea midline,  no masses, no thyromegal,y no JVD, no carotid bruit OROPHARYNX:  Moist, No exudate/  erythema/lesions.  Heart: Regular rate and rhythm, without murmurs, rubs, gallops, PMI non-displaced, no heaves or thrills on palpation.  Lungs: Clear to auscultation, no wheezing or rhonchi noted. No increased vocal fremitus resonant to percussion  Abdomen: Soft, nontender, nondistended, positive bowel sounds, no masses no hepatosplenomegaly noted..  Neuro: No focal neurological deficits noted cranial nerves II through XII grossly intact. DTRs 2+ bilaterally upper and lower extremities. Strength 5 out of 5 in bilateral upper and lower extremities. Musculoskeletal: No warm swelling or erythema around joints, no spinal tenderness noted. Psychiatric: Patient alert and oriented x3, good insight and cognition, good recent to remote recall. Lymph node survey: No cervical axillary or inguinal lymphadenopathy noted.  Lab Results:  Basic Metabolic Panel:    Component Value Date/Time   NA 139 06/03/2016 0924   K 5.2 (H) 06/03/2016 0924   CL 110 06/03/2016 0924   CO2 22 06/03/2016 0924   BUN 27 (H) 06/03/2016 0924   CREATININE 3.68 (H) 06/03/2016 0924   GLUCOSE 193 (H) 06/03/2016 0924   CALCIUM 9.3 06/03/2016 0924   CBC:    Component Value Date/Time   WBC 10.7 (H) 06/03/2016 0932  HGB 6.1 (LL) 06/03/2016 0924   HCT 19.0 (L) 06/03/2016 0924   HCT 21.9 (L) 09/03/2014 1200   PLT 284 06/03/2016 0924   MCV 68.1 (L) 06/03/2016 0924   NEUTROABS 8.5 (H) 03/27/2016 1217   LYMPHSABS 3.0 03/27/2016 1217   MONOABS 1.6 (H) 03/27/2016 1217   EOSABS 1.3 (H) 03/27/2016 1217   BASOSABS 0.1 03/27/2016 1217    No results found for this or any previous visit (from the past 240 hour(s)).  Studies/Results: No results found.  Medications: Scheduled Meds: . amLODipine  10 mg Oral Daily  . folic acid  1 mg Oral Daily  . furosemide  40 mg Oral Daily  . gabapentin  300 mg Oral TID  . heparin  5,000 Units Subcutaneous Q8H  . [START ON 06/04/2016] Influenza vac split quadrivalent PF  0.5 mL  Intramuscular Tomorrow-1000  . insulin aspart  0-9 Units Subcutaneous TID WC  . pantoprazole  40 mg Oral Daily   Continuous Infusions: PRN Meds:.acetaminophen  Consultants:  None  Procedures:  None  Antibiotics:  None  Assessment/Plan: Principal Problem:   Hyperkalemia, diminished renal excretion Active Problems:   Hypertension   Diabetes mellitus ([place])   Cocaine abuse   Hb-SS disease without crisis ([place])   Chronic kidney disease (CKD), stage IV (severe) ([place])   1. Hb SS without crisis: Continue home pain medication 2. Hyperkalemia: D/C ACEI, Nephrology input appreciated. Gentle rehydration, Kayexalate as needed 3. Anemia: Hb has dropped to 6.1 today from 7.6 yesterday. Will transfuse if Hb is < 6   4. HTN: Continue Amlodipine only at this time, Hold ACEI and HCTZ on account of hyperkelemia 5. CKD Stage 4: Nephrologist on board, appreciate input, work-up ordered 6. Diarrhea: Slightly getting better, gentle rehydration, oral liquid intake  Code Status: Full Code Family Communication: N/A Disposition Plan: Not yet ready for discharge  Amin Fornwalt  If 7PM-7AM, please contact night-coverage.  06/03/2016, 3:37 PM  LOS: 1 day

## 2016-06-03 NOTE — ED Notes (Signed)
[facility] here to transfer pt to [facility]. 

## 2016-06-03 NOTE — ED Notes (Signed)
Documented in error Hypoglycemia Protocol sidebar Report for treatment for treatment of CBG70mg /dl.

## 2016-06-03 NOTE — Progress Notes (Signed)
Nutrition Education Note  RD consulted for nutrition education regarding renal diet, 2 gram sodium, 2 gram potassium.   RD discussed common high potassium foods for patient to limit or avoid. Provided "Potassium Content of Foods" and "Low Potassium Foods List" handouts from the Academy of Nutrition and Dietetics. Reviewed pt's dietary recall. Pt reports eating out often, eating TV dinners, snacking on Frito chips, and eating ham sandwiches.   RD provided "Low Sodium Nutrition Therapy" handout from the Academy of Nutrition and Dietetics. Provided examples on ways to decrease sodium intake in diet. Discouraged intake of processed foods and use of salt shaker. Encouraged fresh fruits and vegetables. Pt was able to report some low potassium foods he can use to replace high potassium foods; rice instead of potatoes, grapes instead of bananas, and green beans instead of spinach.   RD discussed why it is important for patient to adhere to diet recommendations, and emphasized  foods to avoid. Teach back method used.  Expect fair compliance. Pt reports that his wife has passed away and he doesn't know how to cook. Family at bedside during education seemed supportive. RD encouraged patient to meet with outpatient dietitian.   Body mass index is 21.64 kg/m. Pt meets criteria for Normal Weight based on current BMI.  Current diet order is Carb Modified, patient is consuming approximately 50-95% of meals at this time. Pt states that his appetite is good and he is eating normally. Labs and medications reviewed. No further nutrition interventions warranted at this time. RD contact information provided. If additional nutrition issues arise, please re-consult RD.   Scarlette Ar RD, CSP, LDN Inpatient Clinical Dietitian Pager: [phone] After Hours Pager: [phone]

## 2016-06-03 NOTE — Progress Notes (Signed)
New Admission Note:   Arrival Method: [facility] from [facility] Mental Orientation:  A & O x 4 Telemetry: Placed on Tele Box #6E07 Assessment: Completed Skin:  Dry but intact IV:  Rt AC NSL Pain: c/o generalized pain 5/10  Tubes: None Safety Measures: Safety Fall Prevention Plan has been given, discussed and signed Admission: Completed [facility] [place] Orientation: Patient has been orientated to the room, unit and staff.  Family: None at bedside  Orders have been reviewed and implemented. Will continue to monitor the patient. Call light has been placed within reach and bed alarm has been activated.   Earleen Reaper RN- [place], [place] Phone number: [phone]

## 2016-06-04 DIAGNOSIS — E875 Hyperkalemia: Principal | ICD-10-CM

## 2016-06-04 LAB — GLUCOSE, CAPILLARY
GLUCOSE-CAPILLARY: 149 mg/dL — AB (ref 65–99)
GLUCOSE-CAPILLARY: 215 mg/dL — AB (ref 65–99)
Glucose-Capillary: 116 mg/dL — ABNORMAL HIGH (ref 65–99)
Glucose-Capillary: 207 mg/dL — ABNORMAL HIGH (ref 65–99)

## 2016-06-04 LAB — LACTATE DEHYDROGENASE: LDH: 331 U/L — ABNORMAL HIGH (ref 98–192)

## 2016-06-04 LAB — CBC WITH DIFFERENTIAL/PLATELET
BASOS ABS: 0.1 10*3/uL (ref 0.0–0.1)
Basophils Relative: 1 %
EOS PCT: 7 %
Eosinophils Absolute: 0.9 10*3/uL — ABNORMAL HIGH (ref 0.0–0.7)
HEMATOCRIT: 18.3 % — AB (ref 39.0–52.0)
HEMOGLOBIN: 6 g/dL — AB (ref 13.0–17.0)
LYMPHS PCT: 33 %
Lymphs Abs: 4.2 10*3/uL — ABNORMAL HIGH (ref 0.7–4.0)
MCH: 21.7 pg — ABNORMAL LOW (ref 26.0–34.0)
MCHC: 32.8 g/dL (ref 30.0–36.0)
MCV: 66.1 fL — ABNORMAL LOW (ref 78.0–100.0)
MONOS PCT: 14 %
Monocytes Absolute: 1.8 10*3/uL — ABNORMAL HIGH (ref 0.1–1.0)
NEUTROS ABS: 5.8 10*3/uL (ref 1.7–7.7)
Neutrophils Relative %: 45 %
Platelets: 290 10*3/uL (ref 150–400)
RBC: 2.77 MIL/uL — ABNORMAL LOW (ref 4.22–5.81)
RDW: 15.6 % — ABNORMAL HIGH (ref 11.5–15.5)
WBC: 12.8 10*3/uL — ABNORMAL HIGH (ref 4.0–10.5)

## 2016-06-04 LAB — RENAL FUNCTION PANEL
ALBUMIN: 3.5 g/dL (ref 3.5–5.0)
ANION GAP: 5 (ref 5–15)
BUN: 28 mg/dL — ABNORMAL HIGH (ref 6–20)
CHLORIDE: 112 mmol/L — AB (ref 101–111)
CO2: 25 mmol/L (ref 22–32)
Calcium: 9.5 mg/dL (ref 8.9–10.3)
Creatinine, Ser: 3.79 mg/dL — ABNORMAL HIGH (ref 0.61–1.24)
GFR calc Af Amer: 18 mL/min — ABNORMAL LOW (ref >60)
GFR calc non Af Amer: 15 mL/min — ABNORMAL LOW (ref >60)
GLUCOSE: 127 mg/dL — AB (ref 65–99)
PHOSPHORUS: 6.1 mg/dL — AB (ref 2.5–4.6)
POTASSIUM: 5.1 mmol/L (ref 3.5–5.1)
Sodium: 142 mmol/L (ref 135–145)

## 2016-06-04 LAB — RETICULOCYTES
RBC.: 2.77 MIL/uL — ABNORMAL LOW (ref 4.22–5.81)
Retic Count, Absolute: 69.3 10*3/uL (ref 19.0–186.0)
Retic Ct Pct: 2.5 % (ref 0.4–3.1)

## 2016-06-04 LAB — COMPREHENSIVE METABOLIC PANEL WITH GFR
ALT: 11 U/L — ABNORMAL LOW (ref 17–63)
AST: 26 U/L (ref 15–41)
Albumin: 3.6 g/dL (ref 3.5–5.0)
Alkaline Phosphatase: 55 U/L (ref 38–126)
Anion gap: 7 (ref 5–15)
BUN: 29 mg/dL — ABNORMAL HIGH (ref 6–20)
CO2: 23 mmol/L (ref 22–32)
Calcium: 9.4 mg/dL (ref 8.9–10.3)
Chloride: 112 mmol/L — ABNORMAL HIGH (ref 101–111)
Creatinine, Ser: 3.82 mg/dL — ABNORMAL HIGH (ref 0.61–1.24)
GFR calc Af Amer: 17 mL/min — ABNORMAL LOW
GFR calc non Af Amer: 15 mL/min — ABNORMAL LOW
Glucose, Bld: 125 mg/dL — ABNORMAL HIGH (ref 65–99)
Potassium: 5 mmol/L (ref 3.5–5.1)
Sodium: 142 mmol/L (ref 135–145)
Total Bilirubin: 1.4 mg/dL — ABNORMAL HIGH (ref 0.3–1.2)
Total Protein: 7.3 g/dL (ref 6.5–8.1)

## 2016-06-04 MED ORDER — LANTHANUM CARBONATE 500 MG PO CHEW
1000.0000 mg | CHEWABLE_TABLET | Freq: Three times a day (TID) | ORAL | Status: DC
  Administered 2016-06-04 – 2016-06-06 (×7): 1000 mg via ORAL
  Filled 2016-06-04 (×7): qty 2

## 2016-06-04 MED ORDER — DARBEPOETIN ALFA 60 MCG/0.3ML IJ SOSY
60.0000 ug | PREFILLED_SYRINGE | INTRAMUSCULAR | Status: DC
  Administered 2016-06-04: 60 ug via SUBCUTANEOUS
  Filled 2016-06-04: qty 0.3

## 2016-06-04 NOTE — Progress Notes (Addendum)
Patient ID: [name], male   DOB: August 30, 1950, 65 y.o.   MRN: [id number] S:No complaints O:BP 122/64 (BP Location: Left Arm)   Pulse 62   Temp 98.3 F (36.8 C) (Oral)   Resp 16   Ht 6\' 1"  (1.854 m)   Wt 74 kg (163 lb 2.3 oz)   SpO2 98%   BMI 21.52 kg/m   Intake/Output Summary (Last 24 hours) at 06/04/16 1044 Last data filed at 06/04/16 5397  Gross per 24 hour  Intake              800 ml  Output             1440 ml  Net             -640 ml   Intake/Output: I/O last 3 completed shifts: In: 2160 [P.O.:1160; I.V.:1000] Out: 1440 [Urine:1440]  Intake/Output this shift:  No intake/output data recorded. Weight change: -0.39 kg (-13.8 oz) Gen:WD WN AAM in NAd CVS:no rub Resp:cta QBH:ALPFXT Ext:no edema   Recent Labs Lab 06/02/16 1814 06/02/16 2144 06/03/16 0924 06/04/16 0208  NA 137  --  139 142  142  K 6.4* 6.5* 5.2* 5.0  5.1  CL 109  --  110 112*  112*  CO2 21*  --  22 23  25   GLUCOSE 118*  --  193* 125*  127*  BUN 33*  --  27* 29*  28*  CREATININE 3.93*  --  3.68* 3.82*  3.79*  ALBUMIN 4.8  --   --  3.6  3.5  CALCIUM 9.8  --  9.3 9.4  9.5  PHOS  --   --   --  6.1*  AST 25  --   --  26  ALT 13*  --   --  11*   Liver Function Tests:  Recent Labs Lab 06/02/16 1814 06/04/16 0208  AST 25 26  ALT 13* 11*  ALKPHOS 71 55  BILITOT 0.9 1.4*  PROT 8.4* 7.3  ALBUMIN 4.8 3.6  3.5    Recent Labs Lab 06/02/16 1814  LIPASE 54*   No results for input(s): AMMONIA in the last 168 hours. CBC:  Recent Labs Lab 06/02/16 1814 06/03/16 0924 06/04/16 0208  WBC 13.0* 10.7* 12.8*  NEUTROABS  --   --  5.8  HGB 7.6* 6.1* 6.0*  HCT 23.5* 19.0* 18.3*  MCV 66.8* 68.1* 66.1*  PLT 348 284 290   Cardiac Enzymes:  Recent Labs Lab 06/02/16 1814  CKTOTAL 102   CBG:  Recent Labs Lab 06/03/16 0752 06/03/16 1214 06/03/16 1632 06/03/16 2208 06/04/16 0759  GLUCAP 89 154* 142* 165* 116*    Iron Studies:  Recent Labs  06/03/16 1415  IRON 92   TIBC 298  FERRITIN 170   Studies/Results: No results found. Marland Kitchen amLODipine  10 mg Oral Daily  . folic acid  1 mg Oral Daily  . furosemide  40 mg Oral Daily  . gabapentin  300 mg Oral TID  . heparin  5,000 Units Subcutaneous Q8H  . insulin aspart  0-9 Units Subcutaneous TID WC  . pantoprazole  40 mg Oral Daily    BMET    Component Value Date/Time   NA 142 06/04/2016 0208   NA 142 06/04/2016 0208   K 5.1 06/04/2016 0208   K 5.0 06/04/2016 0208   CL 112 (H) 06/04/2016 0208   CL 112 (H) 06/04/2016 0208   CO2 25 06/04/2016 0208   CO2 23 06/04/2016 0208  GLUCOSE 127 (H) 06/04/2016 0208   GLUCOSE 125 (H) 06/04/2016 0208   BUN 28 (H) 06/04/2016 0208   BUN 29 (H) 06/04/2016 0208   CREATININE 3.79 (H) 06/04/2016 0208   CREATININE 3.82 (H) 06/04/2016 0208   CALCIUM 9.5 06/04/2016 0208   CALCIUM 9.4 06/04/2016 0208   GFRNONAA 15 (L) 06/04/2016 0208   GFRNONAA 15 (L) 06/04/2016 0208   GFRAA 18 (L) 06/04/2016 0208   GFRAA 17 (L) 06/04/2016 0208   CBC    Component Value Date/Time   WBC 12.8 (H) 06/04/2016 0208   RBC 2.77 (L) 06/04/2016 0208   RBC 2.77 (L) 06/04/2016 0208   HGB 6.0 (LL) 06/04/2016 0208   HCT 18.3 (L) 06/04/2016 0208   HCT 21.9 (L) 09/03/2014 1200   PLT 290 06/04/2016 0208   MCV 66.1 (L) 06/04/2016 0208   MCH 21.7 (L) 06/04/2016 0208   MCHC 32.8 06/04/2016 0208   RDW 15.6 (H) 06/04/2016 0208   LYMPHSABS 4.2 (H) 06/04/2016 0208   MONOABS 1.8 (H) 06/04/2016 0208   EOSABS 0.9 (H) 06/04/2016 0208   BASOSABS 0.1 06/04/2016 0208     Assessment/Plan:  1. CKD stage 4 most likely secondary to HTN +/- DM/Sickle cell disease.  Scr has been above 3 since 2016 and renal US showed bilateral echogenic kidneys.  Need to educate about dialysis and stress better control of DM and HTN.  Would not add ACE-inhibition due to hyperkalemia and advanced CKD. 2. Hyperkalemia- due to CKD and excessive potassium intake.  Improved with lasix.  Educate on low K diet. 3. Anemia- due  to SCA and CKD.  Will start ESA, iron stores stable. 4. HTN- improved. 5. Secondary HPTH- will need to start phospate binders.  iPTh pending. 6. Cocaine abuse- offer counseling and stressed abstinence.  Donetta Potts, MD [employer] [phone]

## 2016-06-04 NOTE — Progress Notes (Signed)
Patient c/o generalized itching.  MD notified.  Received order for PO Benadryl and Sarna Lotion.  Benadryl given with relief.  Will continue to monitor patient.  [employer] RN-BC, WTA.

## 2016-06-04 NOTE — Progress Notes (Signed)
Patient ID: [name], male   DOB: Jul 27, 1950, 65 y.o.   MRN: [id number] Subjective: [name] is a 65 y.o. male with medical history significant for DM, HTN, sickle cell disease, cocaine abuse which he admits is ongoing despite recent admission for CP back in October.  At baseline lab work demonstrates progressively worsening renal function for the past several years. He admits that he was first told he needed to be following with a nephrologist "about a year ago", but has never seen one. Creat back in October was ~3.4.  K during that admit was running about 5.4. He presented to the ED with c/o generalized body aches, generalized muscle weakness. Symptoms gradually worsening since onset yesterday. No fever, chills. He does admit to poor appetite, nausea, diarrhea for the last 3 days (4-5 loose BMs / Day).  Due to increasing serum K in the setting of CKD with worsening function, patient was transferred from [facility] to [facility] Unit and Nephrology consulted.   Patient has no new complaint today. Renal function still at around 3-4. Nephrology following.  No fever, no chest pain, no shortness of breath.    Objective:  Vital signs in last 24 hours:  Vitals:   06/03/16 1812 06/03/16 2209 06/04/16 0522 06/04/16 0854  BP: 119/67 130/68 116/65 122/64  Pulse: 70 (!) 59 65 62  Resp: 17 16 16 16   Temp: 99 F (37.2 C) 98.9 F (37.2 C) 99 F (37.2 C) 98.3 F (36.8 C)  TempSrc: Oral Oral Oral Oral  SpO2: 100% 100% 98% 98%  Weight:  74 kg (163 lb 2.3 oz)    Height:       Intake/Output from previous day:   Intake/Output Summary (Last 24 hours) at 06/04/16 1504 Last data filed at 06/04/16 1300  Gross per 24 hour  Intake              940 ml  Output             1165 ml  Net             -225 ml   Physical Exam: General: Alert, awake, oriented x3, in no acute distress.  HEENT: [place]/AT PEERL, EOMI Neck: Trachea midline,  no masses, no thyromegal,y no JVD, no carotid bruit OROPHARYNX:   Moist, No exudate/ erythema/lesions.  Heart: Regular rate and rhythm, without murmurs, rubs, gallops, PMI non-displaced, no heaves or thrills on palpation.  Lungs: Clear to auscultation, no wheezing or rhonchi noted. No increased vocal fremitus resonant to percussion  Abdomen: Soft, nontender, nondistended, positive bowel sounds, no masses no hepatosplenomegaly noted..  Neuro: No focal neurological deficits noted cranial nerves II through XII grossly intact. DTRs 2+ bilaterally upper and lower extremities. Strength 5 out of 5 in bilateral upper and lower extremities. Musculoskeletal: No warm swelling or erythema around joints, no spinal tenderness noted. Psychiatric: Patient alert and oriented x3, good insight and cognition, good recent to remote recall. Lymph node survey: No cervical axillary or inguinal lymphadenopathy noted.  Lab Results:  Basic Metabolic Panel:    Component Value Date/Time   NA 142 06/04/2016 0208   NA 142 06/04/2016 0208   K 5.1 06/04/2016 0208   K 5.0 06/04/2016 0208   CL 112 (H) 06/04/2016 0208   CL 112 (H) 06/04/2016 0208   CO2 25 06/04/2016 0208   CO2 23 06/04/2016 0208   BUN 28 (H) 06/04/2016 0208   BUN 29 (H) 06/04/2016 0208   CREATININE 3.79 (H) 06/04/2016  0208   CREATININE 3.82 (H) 06/04/2016 0208   GLUCOSE 127 (H) 06/04/2016 0208   GLUCOSE 125 (H) 06/04/2016 0208   CALCIUM 9.5 06/04/2016 0208   CALCIUM 9.4 06/04/2016 0208   CBC:    Component Value Date/Time   WBC 12.8 (H) 06/04/2016 0208   HGB 6.0 (LL) 06/04/2016 0208   HCT 18.3 (L) 06/04/2016 0208   HCT 21.9 (L) 09/03/2014 1200   PLT 290 06/04/2016 0208   MCV 66.1 (L) 06/04/2016 0208   NEUTROABS 5.8 06/04/2016 0208   LYMPHSABS 4.2 (H) 06/04/2016 0208   MONOABS 1.8 (H) 06/04/2016 0208   EOSABS 0.9 (H) 06/04/2016 0208   BASOSABS 0.1 06/04/2016 0208    No results found for this or any previous visit (from the past 240 hour(s)).  Studies/Results: No results  found.  Medications: Scheduled Meds: . amLODipine  10 mg Oral Daily  . darbepoetin (ARANESP) injection - NON-DIALYSIS  60 mcg Subcutaneous Q14 Days  . folic acid  1 mg Oral Daily  . furosemide  40 mg Oral Daily  . gabapentin  300 mg Oral TID  . heparin  5,000 Units Subcutaneous Q8H  . insulin aspart  0-9 Units Subcutaneous TID WC  . lanthanum  1,000 mg Oral TID WC  . pantoprazole  40 mg Oral Daily   Continuous Infusions: PRN Meds:.acetaminophen, camphor-menthol, diphenhydrAMINE, oxyCODONE-acetaminophen  Consultants:  None  Procedures:  None  Antibiotics:  None  Assessment/Plan: Principal Problem:   Hyperkalemia, diminished renal excretion Active Problems:   Hypertension   Diabetes mellitus ([place])   Cocaine abuse   Hb-SS disease without crisis ([place])   Chronic kidney disease (CKD), stage IV (severe) ([place])   1. Hb SS without crisis: Pain is resolved and patient is now on oral therapy. Continue home pain medication 2. Hyperkalemia: Resolved. D/C ACEI, Nephrology input appreciated. Gentle rehydration, Kayexalate as needed 3. Anemia: Hb has dropped to 6.1 today from 7.6 yesterday. Will transfuse if Hb is < 6   4. HTN: Continue Amlodipine only at this time, Hold ACEI and HCTZ on account of hyperkelemia 5. CKD Stage 4: Nephrologist on board, appreciate input, work-up ordered. No HD planned now. 6. Diarrhea: Resolved.Continue gentle rehydration, oral liquid intake  Code Status: Full Code Family Communication: N/A Disposition Plan: Not yet ready for discharge  Glynn Yepes,LAWAL  If 7PM-7AM, please contact night-coverage.  06/04/2016, 3:04 PM  LOS: 2 days

## 2016-06-05 LAB — CBC WITH DIFFERENTIAL/PLATELET
BASOS ABS: 0.1 10*3/uL (ref 0.0–0.1)
Band Neutrophils: 0 %
Basophils Relative: 1 %
Blasts: 0 %
Eosinophils Absolute: 0.9 10*3/uL — ABNORMAL HIGH (ref 0.0–0.7)
Eosinophils Relative: 7 %
HCT: 18.7 % — ABNORMAL LOW (ref 39.0–52.0)
HEMOGLOBIN: 6.2 g/dL — AB (ref 13.0–17.0)
LYMPHS PCT: 24 %
Lymphs Abs: 3.2 10*3/uL (ref 0.7–4.0)
MCH: 21.9 pg — AB (ref 26.0–34.0)
MCHC: 33.2 g/dL (ref 30.0–36.0)
MCV: 66.1 fL — ABNORMAL LOW (ref 78.0–100.0)
METAMYELOCYTES PCT: 0 %
MONOS PCT: 13 %
Monocytes Absolute: 1.7 10*3/uL — ABNORMAL HIGH (ref 0.1–1.0)
Myelocytes: 0 %
NEUTROS ABS: 7.4 10*3/uL (ref 1.7–7.7)
Neutrophils Relative %: 55 %
Other: 0 %
Platelets: 319 10*3/uL (ref 150–400)
Promyelocytes Absolute: 0 %
RBC: 2.83 MIL/uL — ABNORMAL LOW (ref 4.22–5.81)
RDW: 16.4 % — ABNORMAL HIGH (ref 11.5–15.5)
WBC: 13.3 10*3/uL — AB (ref 4.0–10.5)
nRBC: 0 /100 WBC

## 2016-06-05 LAB — PARATHYROID HORMONE, INTACT (NO CA): PTH: 96 pg/mL — ABNORMAL HIGH (ref 15–65)

## 2016-06-05 LAB — RENAL FUNCTION PANEL
ANION GAP: 4 — AB (ref 5–15)
Albumin: 3.5 g/dL (ref 3.5–5.0)
BUN: 32 mg/dL — ABNORMAL HIGH (ref 6–20)
CHLORIDE: 110 mmol/L (ref 101–111)
CO2: 27 mmol/L (ref 22–32)
CREATININE: 4.17 mg/dL — AB (ref 0.61–1.24)
Calcium: 9.5 mg/dL (ref 8.9–10.3)
GFR calc Af Amer: 16 mL/min — ABNORMAL LOW (ref >60)
GFR, EST NON AFRICAN AMERICAN: 13 mL/min — AB (ref >60)
Glucose, Bld: 158 mg/dL — ABNORMAL HIGH (ref 65–99)
POTASSIUM: 4.5 mmol/L (ref 3.5–5.1)
Phosphorus: 4.9 mg/dL — ABNORMAL HIGH (ref 2.5–4.6)
Sodium: 141 mmol/L (ref 135–145)

## 2016-06-05 LAB — GLUCOSE, CAPILLARY
GLUCOSE-CAPILLARY: 220 mg/dL — AB (ref 65–99)
GLUCOSE-CAPILLARY: 157 mg/dL — AB (ref 65–99)
GLUCOSE-CAPILLARY: 221 mg/dL — AB (ref 65–99)
Glucose-Capillary: 124 mg/dL — ABNORMAL HIGH (ref 65–99)

## 2016-06-05 MED ORDER — GABAPENTIN 300 MG PO CAPS: 300.0000 mg | ORAL_CAPSULE | Freq: Every day | ORAL | Status: DC

## 2016-06-05 NOTE — Progress Notes (Signed)
Patient ID: [name], male   DOB: [birth year], 66 y.o.   MRN: [id number] S:No complaints O:BP (!) 109/51 (BP Location: Left Arm)   Pulse 75   Temp 98.4 F (36.9 C) (Oral)   Resp 16   Ht 6\' 1"  (1.854 m)   Wt 74 kg (163 lb 2.3 oz)   SpO2 98%   BMI 21.52 kg/m   Intake/Output Summary (Last 24 hours) at 06/05/16 1024 Last data filed at 06/05/16 0933  Gross per 24 hour  Intake             1540 ml  Output             1525 ml  Net               15 ml   Intake/Output: I/O last 3 completed shifts: In: 1660 [P.O.:1660] Out: 1990 [Urine:1990]  Intake/Output this shift:  Total I/O In: 360 [P.O.:360] Out: 125 [Urine:125] Weight change:  Gen:WD WN AAM in NAd CVS:no rub Resp:cta EVO:[id number] Ext:no edema   Recent Labs Lab 06/02/16 1814 06/02/16 2144 06/03/16 0924 06/04/16 0208 06/05/16 0658  NA 137  --  139 142  142 141  K 6.4* 6.5* 5.2* 5.0  5.1 4.5  CL 109  --  110 112*  112* 110  CO2 21*  --  22 23  25 27   GLUCOSE 118*  --  193* 125*  127* 158*  BUN 33*  --  27* 29*  28* 32*  CREATININE 3.93*  --  3.68* 3.82*  3.79* 4.17*  ALBUMIN 4.8  --   --  3.6  3.5 3.5  CALCIUM 9.8  --  9.3 9.4  9.5 9.5  PHOS  --   --   --  6.1* 4.9*  AST 25  --   --  26  --   ALT 13*  --   --  11*  --    Liver Function Tests:  Recent Labs Lab 06/02/16 1814 06/04/16 0208 06/05/16 0658  AST 25 26  --   ALT 13* 11*  --   ALKPHOS 71 55  --   BILITOT 0.9 1.4*  --   PROT 8.4* 7.3  --   ALBUMIN 4.8 3.6  3.5 3.5    Recent Labs Lab 06/02/16 1814  LIPASE 54*   No results for input(s): AMMONIA in the last 168 hours. CBC:  Recent Labs Lab 06/02/16 1814 06/03/16 0924 06/04/16 0208  WBC 13.0* 10.7* 12.8*  NEUTROABS  --   --  5.8  HGB 7.6* 6.1* 6.0*  HCT 23.5* 19.0* 18.3*  MCV 66.8* 68.1* 66.1*  PLT 348 284 290   Cardiac Enzymes:  Recent Labs Lab 06/02/16 1814  CKTOTAL 102   CBG:  Recent Labs Lab 06/04/16 0759 06/04/16 1221 06/04/16 1743 06/04/16 2126  06/05/16 0813  GLUCAP 116* 207* 149* 215* 124*    Iron Studies:  Recent Labs  06/03/16 1415  IRON 92  TIBC 298  FERRITIN 170   Studies/Results: No results found. Marland Kitchen amLODipine  10 mg Oral Daily  . darbepoetin (ARANESP) injection - NON-DIALYSIS  60 mcg Subcutaneous Q14 Days  . folic acid  1 mg Oral Daily  . furosemide  40 mg Oral Daily  . gabapentin  300 mg Oral TID  . heparin  5,000 Units Subcutaneous Q8H  . insulin aspart  0-9 Units Subcutaneous TID WC  . lanthanum  1,000 mg Oral TID WC  . pantoprazole  40 mg Oral Daily  BMET    Component Value Date/Time   NA 141 06/05/2016 0658   K 4.5 06/05/2016 0658   CL 110 06/05/2016 0658   CO2 27 06/05/2016 0658   GLUCOSE 158 (H) 06/05/2016 0658   BUN 32 (H) 06/05/2016 0658   CREATININE 4.17 (H) 06/05/2016 0658   CALCIUM 9.5 06/05/2016 0658   GFRNONAA 13 (L) 06/05/2016 0658   GFRAA 16 (L) 06/05/2016 0658   CBC    Component Value Date/Time   WBC 12.8 (H) 06/04/2016 0208   RBC 2.77 (L) 06/04/2016 0208   RBC 2.77 (L) 06/04/2016 0208   HGB 6.0 (LL) 06/04/2016 0208   HCT 18.3 (L) 06/04/2016 0208   HCT 21.9 (L) 09/03/2014 1200   PLT 290 06/04/2016 0208   MCV 66.1 (L) 06/04/2016 0208   MCH 21.7 (L) 06/04/2016 0208   MCHC 32.8 06/04/2016 0208   RDW 15.6 (H) 06/04/2016 0208   LYMPHSABS 4.2 (H) 06/04/2016 0208   MONOABS 1.8 (H) 06/04/2016 0208   EOSABS 0.9 (H) 06/04/2016 0208   BASOSABS 0.1 06/04/2016 0208     Assessment/Plan:  1. CKD stage 4 most likely secondary to HTN +/- DM/Sickle cell disease.  Scr has been above 3 since 2016 and renal US showed bilateral echogenic kidneys.  Need to educate about dialysis and stress better control of DM and HTN.  Would not add ACE-inhibition due to hyperkalemia and advanced CKD. 2. Hyperkalemia- due to CKD and excessive potassium intake.  Improved with lasix.  Educate on low K diet. 3. Anemia- due to SCA and CKD.  Will start ESA, iron stores stable. 4. HTN- improved. 5. Secondary  HPTH- will need to start phospate binders.  iPTh pending. 6. Cocaine abuse- offer counseling and stressed abstinence. Donetta Potts, MD [employer] [phone]

## 2016-06-05 NOTE — Progress Notes (Signed)
Patient ID: [name], male   DOB: [birth year], 66 y.o.   MRN: [id number] Subjective: [name] is a 66 y.o. male with medical history significant for DM, HTN, sickle cell disease, cocaine abuse which he admits is ongoing despite recent admission for CP back in October.  At baseline lab work demonstrates progressively worsening renal function for the past several years. He admits that he was first told he needed to be following with a nephrologist "about a year ago", but has never seen one. Creat back in October was ~3.4.  K during that admit was running about 5.4. He presented to the ED with c/o generalized body aches, generalized muscle weakness. Symptoms gradually worsening since onset yesterday. No fever, chills. He does admit to poor appetite, nausea, diarrhea for the last 3 days (4-5 loose BMs / Day).  Due to increasing serum K in the setting of CKD with worsening function, patient was transferred from [facility] hospital to [facility] Renal Unit and Nephrology consulted.   Patient has no new complaint today. Renal function Worsening and patient has leucocytosis and anemia. Nephrology following.  No fever, no chest pain, no shortness of breath.    Objective:  Vital signs in last 24 hours:  Vitals:   06/04/16 1700 06/04/16 2130 06/05/16 0452 06/05/16 0933  BP: (!) 129/58 121/70 (!) 103/55 (!) 109/51  Pulse: 77 72 66 75  Resp: 16 16 16 16   Temp: 98.5 F (36.9 C) 98.9 F (37.2 C) 98.7 F (37.1 C) 98.4 F (36.9 C)  TempSrc: Oral Oral Oral Oral  SpO2: 98% 100% 95% 98%  Weight:      Height:       Intake/Output from previous day:   Intake/Output Summary (Last 24 hours) at 06/05/16 1505 Last data filed at 06/05/16 1400  Gross per 24 hour  Intake             1540 ml  Output             1375 ml  Net              165 ml   Physical Exam: General: Alert, awake, oriented x3, in no acute distress.  HEENT: [place]/AT PEERL, EOMI Neck: Trachea midline,  no masses, no thyromegal,y no JVD, no carotid  bruit OROPHARYNX:  Moist, No exudate/ erythema/lesions.  Heart: Regular rate and rhythm, without murmurs, rubs, gallops, PMI non-displaced, no heaves or thrills on palpation.  Lungs: Clear to auscultation, no wheezing or rhonchi noted. No increased vocal fremitus resonant to percussion  Abdomen: Soft, nontender, nondistended, positive bowel sounds, no masses no hepatosplenomegaly noted..  Neuro: No focal neurological deficits noted cranial nerves II through XII grossly intact. DTRs 2+ bilaterally upper and lower extremities. Strength 5 out of 5 in bilateral upper and lower extremities. Musculoskeletal: No warm swelling or erythema around joints, no spinal tenderness noted. Psychiatric: Patient alert and oriented x3, good insight and cognition, good recent to remote recall. Lymph node survey: No cervical axillary or inguinal lymphadenopathy noted.  Lab Results:  Basic Metabolic Panel:    Component Value Date/Time   NA 141 06/05/2016 0658   K 4.5 06/05/2016 0658   CL 110 06/05/2016 0658   CO2 27 06/05/2016 0658   BUN 32 (H) 06/05/2016 0658   CREATININE 4.17 (H) 06/05/2016 0658   GLUCOSE 158 (H) 06/05/2016 0658   CALCIUM 9.5 06/05/2016 0658   CBC:    Component Value Date/Time   WBC 13.3 (H) 06/05/2016 1119   HGB  6.2 (LL) 06/05/2016 1119   HCT 18.7 (L) 06/05/2016 1119   HCT 21.9 (L) 09/03/2014 1200   PLT 319 06/05/2016 1119   MCV 66.1 (L) 06/05/2016 1119   NEUTROABS 7.4 06/05/2016 1119   LYMPHSABS 3.2 06/05/2016 1119   MONOABS 1.7 (H) 06/05/2016 1119   EOSABS 0.9 (H) 06/05/2016 1119   BASOSABS 0.1 06/05/2016 1119    No results found for this or any previous visit (from the past 240 hour(s)).  Studies/Results: No results found.  Medications: Scheduled Meds: . amLODipine  10 mg Oral Daily  . darbepoetin (ARANESP) injection - NON-DIALYSIS  60 mcg Subcutaneous Q14 Days  . folic acid  1 mg Oral Daily  . furosemide  40 mg Oral Daily  . [START ON 06/06/2016] gabapentin  300 mg  Oral QHS  . heparin  5,000 Units Subcutaneous Q8H  . insulin aspart  0-9 Units Subcutaneous TID WC  . lanthanum  1,000 mg Oral TID WC  . 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 CKD Stage 4: Renal function worsening but chronically in the same range. D/W Nephrology. Goal is to see improvement in Creatinine before DC. Not HD candidate yet. Nephrologist on board, appreciate input, work-up ordered. No HD planned now. 2. Hb SS without crisis: Pain is resolved and patient is now on oral therapy. Continue home pain medication 3. Hyperkalemia: Resolved. D/C ACEI, Nephrology input appreciated. Gentle rehydration, Kayexalate as needed 4. Anemia: Hb has dropped to 6.1. His baseline is 6.0 to 7.0. Will transfuse if Hb is < 6   5. HTN: Continue Amlodipine only at this time, Hold ACEI and HCTZ on account of hyperkelemia 6. Diarrhea: Resolved.Continue gentle rehydration, oral liquid intake 7. Leucocytosis: Chronic increase in WBC probably due to VOC and asplenia. Continue to monitor.  Code Status: Full Code Family Communication: N/A Disposition Plan: Not yet ready for discharge  Kelden Lavallee,LAWAL  If 7PM-7AM, please contact night-coverage.  06/05/2016, 3:05 PM  LOS: 3 days

## 2016-06-06 DIAGNOSIS — I10 Essential (primary) hypertension: Secondary | ICD-10-CM

## 2016-06-06 DIAGNOSIS — N184 Chronic kidney disease, stage 4 (severe): Secondary | ICD-10-CM

## 2016-06-06 DIAGNOSIS — E1122 Type 2 diabetes mellitus with diabetic chronic kidney disease: Secondary | ICD-10-CM

## 2016-06-06 DIAGNOSIS — D571 Sickle-cell disease without crisis: Secondary | ICD-10-CM

## 2016-06-06 LAB — COMPREHENSIVE METABOLIC PANEL
ALK PHOS: 61 U/L (ref 38–126)
ALT: 13 U/L — AB (ref 17–63)
AST: 30 U/L (ref 15–41)
Albumin: 4.1 g/dL (ref 3.5–5.0)
Anion gap: 6 (ref 5–15)
BILIRUBIN TOTAL: 1.1 mg/dL (ref 0.3–1.2)
BUN: 34 mg/dL — AB (ref 6–20)
CALCIUM: 9.6 mg/dL (ref 8.9–10.3)
CO2: 24 mmol/L (ref 22–32)
CREATININE: 4.12 mg/dL — AB (ref 0.61–1.24)
Chloride: 107 mmol/L (ref 101–111)
GFR, EST AFRICAN AMERICAN: 16 mL/min — AB (ref >60)
GFR, EST NON AFRICAN AMERICAN: 14 mL/min — AB (ref >60)
Glucose, Bld: 162 mg/dL — ABNORMAL HIGH (ref 65–99)
Potassium: 4.6 mmol/L (ref 3.5–5.1)
Sodium: 137 mmol/L (ref 135–145)
Total Protein: 7.8 g/dL (ref 6.5–8.1)

## 2016-06-06 LAB — CBC WITH DIFFERENTIAL/PLATELET
BASOS PCT: 1 %
Basophils Absolute: 0.2 10*3/uL — ABNORMAL HIGH (ref 0.0–0.1)
EOS PCT: 6 %
Eosinophils Absolute: 1 10*3/uL — ABNORMAL HIGH (ref 0.0–0.7)
HEMATOCRIT: 19.5 % — AB (ref 39.0–52.0)
HEMOGLOBIN: 6.2 g/dL — AB (ref 13.0–17.0)
LYMPHS PCT: 27 %
Lymphs Abs: 4.5 10*3/uL — ABNORMAL HIGH (ref 0.7–4.0)
MCH: 21 pg — AB (ref 26.0–34.0)
MCHC: 31.8 g/dL (ref 30.0–36.0)
MCV: 66.1 fL — AB (ref 78.0–100.0)
MONOS PCT: 12 %
Monocytes Absolute: 2 10*3/uL — ABNORMAL HIGH (ref 0.1–1.0)
NEUTROS ABS: 9 10*3/uL — AB (ref 1.7–7.7)
Neutrophils Relative %: 54 %
Platelets: ADEQUATE 10*3/uL (ref 150–400)
RBC: 2.95 MIL/uL — ABNORMAL LOW (ref 4.22–5.81)
RDW: 17 % — ABNORMAL HIGH (ref 11.5–15.5)
WBC: 16.7 10*3/uL — AB (ref 4.0–10.5)

## 2016-06-06 LAB — GLUCOSE, CAPILLARY
GLUCOSE-CAPILLARY: 146 mg/dL — AB (ref 65–99)
GLUCOSE-CAPILLARY: 158 mg/dL — AB (ref 65–99)
Glucose-Capillary: 158 mg/dL — ABNORMAL HIGH (ref 65–99)
Glucose-Capillary: 133 mg/dL — ABNORMAL HIGH (ref 65–99)

## 2016-06-06 LAB — PROTEIN ELECTROPHORESIS, SERUM
A/G RATIO SPE: 1.2 (ref 0.7–1.7)
ALPHA-1-GLOBULIN: 0.2 g/dL (ref 0.0–0.4)
ALPHA-2-GLOBULIN: 0.5 g/dL (ref 0.4–1.0)
Albumin ELP: 3.7 g/dL (ref 2.9–4.4)
Beta Globulin: 0.8 g/dL (ref 0.7–1.3)
Gamma Globulin: 1.5 g/dL (ref 0.4–1.8)
Globulin, Total: 3.1 g/dL (ref 2.2–3.9)
PDF: 0
Total Protein ELP: 6.8 g/dL (ref 6.0–8.5)

## 2016-06-06 MED ORDER — LANTHANUM CARBONATE 1000 MG PO CHEW: 1000.0000 mg | CHEWABLE_TABLET | Freq: Three times a day (TID) | ORAL | 0 refills | Status: DC

## 2016-06-06 MED ORDER — AMLODIPINE BESYLATE 10 MG PO TABS: 10.0000 mg | ORAL_TABLET | Freq: Every day | ORAL | 0 refills | Status: DC

## 2016-06-06 MED ORDER — FUROSEMIDE 40 MG PO TABS: 40.0000 mg | ORAL_TABLET | Freq: Every day | ORAL | 0 refills | Status: DC

## 2016-06-06 MED ORDER — OXYCODONE-ACETAMINOPHEN 5-325 MG PO TABS: 1.0000 | ORAL_TABLET | ORAL | 0 refills | Status: DC | PRN

## 2016-06-06 NOTE — Discharge Summary (Signed)
[name] MRN: [id number] DOB/AGE: Oct 14, 1950 66 y.o.  Admit date: 06/02/2016 Discharge date: 06/06/2016  Primary Care Physician:  Philis Fendt, MD   Discharge Diagnoses:   Patient Active Problem List   Diagnosis Date Noted  . Heart murmur 03/27/2016  . Chronic kidney disease (CKD), stage IV (severe) ([place]) 03/26/2016  . Cocaine abuse 03/25/2016  . Acute renal failure superimposed on chronic kidney disease ([place])   . 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 Fever 09/07/2014  . Acute encephalopathy   . Hyperkalemia, diminished renal excretion 09/03/2014  . Thrombocytopenia, unspecified 04/15/2013  . Sickle cell anemia with pain ([place]) 04/09/2012  . Chest pain 07/28/2011  . Leukocytosis 07/28/2011  . LBBB (left bundle branch block) 07/28/2011  . Sickle cell anemia ([facility])   . Hypertension   . Diabetes mellitus ([place])     DISCHARGE MEDICATION: Allergies as of 06/06/2016      Reactions   Morphine And Related Itching      Medication List    STOP taking these medications   amLODipine-benazepril 10-20 MG capsule Commonly known as:  LOTREL   hydrochlorothiazide 12.5 MG capsule Commonly known as:  MICROZIDE     TAKE these medications   amLODipine 10 MG tablet Commonly known as:  NORVASC Take 1 tablet (10 mg total) by mouth daily. Start taking on:  06/07/2016   aspirin EC 325 MG tablet Take 325 mg by mouth daily.   folic acid 1 MG tablet Commonly known as:  FOLVITE Take 1 tablet (1 mg total) by mouth daily.   furosemide 40 MG tablet Commonly known as:  LASIX Take 1 tablet (40 mg total) by mouth daily. Start taking on:  06/07/2016   gabapentin 300 MG capsule Commonly known as:  NEURONTIN Take 300 mg by mouth 3 (three) times daily.   glipiZIDE 10 MG 24 hr tablet Commonly known as:  GLUCOTROL XL Take 10 mg by mouth 2 (two) times daily.   lanthanum 1000 MG chewable tablet Commonly known as:  FOSRENOL Chew 1 tablet (1,000 mg total) by mouth 3 (three) times daily with  meals.   omeprazole 20 MG capsule Commonly known as:  PRILOSEC Take 20 mg by mouth daily.   oxyCODONE-acetaminophen 5-325 MG tablet Commonly known as:  PERCOCET/ROXICET Take 1 tablet by mouth every 4 (four) hours as needed for moderate pain. What changed:  reasons to take this         Consults: Treatment Team:  Donato Heinz, MD Leana Gamer, MD   SIGNIFICANT DIAGNOSTIC STUDIES:  No results found.     No results found for this or any previous visit (from the past 240 hour(s)).  BRIEF ADMITTING H & P: [name] is a 66 y.o. male with medical history significant of DM, HTN, sickle cell disease, cocaine abuse which he admits is ongoing despite recent admission for CP back in October.  At baseline lab work demonstrates progressively worsening renal function for the past several years.  He admits that he was first told he needed to be following with a nephrologist "about a year ago", but has never seen one.  Creat back in October was ~3.4.  K during that admit was running about 5.4.  Today he presents to the ED with c/o generalized body aches, generalized muscle weakness.  Symptoms gradually worsening since onset yesterday.  No fever, chills.  He does admit to poor appetite, nausea, diarrhea for the last 3 days (4-5 loose BMs / Day).  ED  Course: Work up in the ED is most remarkable for potassium of 6.4 (confirmed to 6.5 on repeat) Creat now up to 3.9, BUN 33.    Hospital Course:  Present on Admission: . Hyperkalemia, diminished renal excretion: Pt was found to have an elevated potassium level in the setting of worsening renal failure. Pt was treated with hydrationa dn Kayexalate. At the time of discharge his potassium was 4.6. Marland Kitchen Chronic kidney disease (CKD), stage IV (severe) ([place]): Pt was found to have CKD IV and noted to have Cr > 3 since 2016. He was seen in consult by Nephrology and at this time he will follow up in the put patient setting with Dr.  Merlinda Frederick on 06/14/2016. ACE-I and HCTZ were discontinued.  . Hypertension: Pt had previously been taking Lotrel for BP control This was discontinued and patient is being treated with Norvasc 10 mg with good BP control.  . Dm II: Pt is continued on Glipizide. 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 He had been taking Ibuprofen at home for control of day to day pain. This is contraindicated and he has not required a prescription for Percocet since 12/2015 per [place]. A prescription for Percocet 5-325 mg #30 tabs issued.  . Cocaine abuse: Pt advised against cocaine use. He denies current use.      Disposition and Follow-up: Pt discharged in good condition and will follow up with Dr. Jeanie Cooks in 1 week and with Dr. Arty Baumgartner on 06/14/2016.    DISCHARGE EXAM:  General: Alert, awake, oriented x3, in no apparent distress.  HEENT: [place]/AT PEERL, EOMI, anicteric Neck: Trachea midline, no masses, no thyromegal,y no JVD, no carotid bruit OROPHARYNX: Moist, No exudate/ erythema/lesions.  Heart: Regular rate and rhythm, without murmurs, rubs, gallops or S3. PMI non-displaced. Exam reveals no decreased pulses. Pulmonary/Chest: Normal effort. Breath sounds normal. No. Apnea. Clear to auscultation,no stridor,  no wheezing and no rhonchi noted. No respiratory distress and no tenderness noted. Abdomen: Soft, nontender, nondistended, normal bowel sounds, no masses no hepatosplenomegaly noted. No fluid wave and no ascites. There is no guarding or rebound. Neuro: Alert and oriented to person, place and time. Normal motor skills, Displays no atrophy or tremors and exhibits normal muscle tone.  No focal neurological deficits noted cranial nerves II through XII grossly intact. No sensory deficit noted. Strength at baseline in bilateral upper and lower extremities. Gait normal. Musculoskeletal: No warm swelling or erythema around joints, no spinal  tenderness noted. Psychiatric: Patient alert and oriented x3, good insight and cognition, good recent to remote recall.  Mood, affect and judgement normal Lymph node survey: No cervical axillary or inguinal lymphadenopathy noted. Skin: Skin is warm and dry. No bruising, no ecchymosis and no rash noted. Pt is not diaphoretic. No erythema. No pallor    Blood pressure 127/71, pulse 77, temperature 98.1 F (36.7 C), temperature source Oral, resp. rate 18, height 6\' 1"  (1.854 m), weight 74.8 kg (164 lb 14.5 oz), SpO2 96 %.   Recent Labs  06/04/16 0208 06/05/16 0658 06/06/16 0840  NA 142  142 141 137  K 5.0  5.1 4.5 4.6  CL 112*  112* 110 107  CO2 23  25 27 24   GLUCOSE 125*  127* 158* 162*  BUN 29*  28* 32* 34*  CREATININE 3.82*  3.79* 4.17* 4.12*  CALCIUM 9.4  9.5 9.5 9.6  PHOS 6.1* 4.9*  --     Recent Labs  06/04/16 0208 06/05/16 0658 06/06/16 0840  AST 26  --  30  ALT 11*  --  13*  ALKPHOS 55  --  61  BILITOT 1.4*  --  1.1  PROT 7.3  --  7.8  ALBUMIN 3.6  3.5 3.5 4.1   No results for input(s): LIPASE, AMYLASE in the last 72 hours.  Recent Labs  06/05/16 1119 06/06/16 0840  WBC 13.3* 16.7*  NEUTROABS 7.4 9.0*  HGB 6.2* 6.2*  HCT 18.7* 19.5*  MCV 66.1* 66.1*  PLT 319 PLATELETS APPEAR ADEQUATE     Total time spent including face to face and decision making was greater than 30 minutes  Signed: Alexiz Cothran A. 06/06/2016, 3:25 PM

## 2016-06-06 NOTE — Progress Notes (Signed)
Patient is discharged from room 6E07 at this time. Alert and in stable condition. IV site d/c'd as well as tele. Instructions read to patient and wife with understanding verbalized. Left unit via wheelchair with all belongings at side.

## 2016-06-06 NOTE — Care Management Important Message (Signed)
Important Message  Patient Details  Name: [name] MRN: [id number] Date of Birth: April 01, 1951   Medicare Important Message Given:  Yes    Kora Groom [address] 06/06/2016, 4:43 PM

## 2016-06-06 NOTE — Progress Notes (Signed)
Patient ID: [name], male   DOB: 1951-04-17, 66 y.o.   MRN: [id number] S:Feels well O:BP 127/71 (BP Location: Left Arm)   Pulse 77   Temp 98.1 F (36.7 C) (Oral)   Resp 18   Ht 6\' 1"  (1.854 m)   Wt 74.8 kg (164 lb 14.5 oz)   SpO2 96%   BMI 21.76 kg/m   Intake/Output Summary (Last 24 hours) at 06/06/16 1257 Last data filed at 06/06/16 1000  Gross per 24 hour  Intake              840 ml  Output             1700 ml  Net             -860 ml   Intake/Output: I/O last 3 completed shifts: In: 1540 [P.O.:1540] Out: 2275 [Urine:2275]  Intake/Output this shift:  Total I/O In: 360 [P.O.:360] Out: 100 [Urine:100] Weight change:  Gen: WD WN AAM in NAD CVS:no rub Resp:cta DUK:GURKYH Ext:no edema   Recent Labs Lab 06/02/16 1814 06/02/16 2144 06/03/16 0924 06/04/16 0208 06/05/16 0658 06/06/16 0840  NA 137  --  139 142  142 141 137  K 6.4* 6.5* 5.2* 5.0  5.1 4.5 4.6  CL 109  --  110 112*  112* 110 107  CO2 21*  --  22 23  25 27 24   GLUCOSE 118*  --  193* 125*  127* 158* 162*  BUN 33*  --  27* 29*  28* 32* 34*  CREATININE 3.93*  --  3.68* 3.82*  3.79* 4.17* 4.12*  ALBUMIN 4.8  --   --  3.6  3.5 3.5 4.1  CALCIUM 9.8  --  9.3 9.4  9.5 9.5 9.6  PHOS  --   --   --  6.1* 4.9*  --   AST 25  --   --  26  --  30  ALT 13*  --   --  11*  --  13*   Liver Function Tests:  Recent Labs Lab 06/02/16 1814 06/04/16 0208 06/05/16 0658 06/06/16 0840  AST 25 26  --  30  ALT 13* 11*  --  13*  ALKPHOS 71 55  --  61  BILITOT 0.9 1.4*  --  1.1  PROT 8.4* 7.3  --  7.8  ALBUMIN 4.8 3.6  3.5 3.5 4.1    Recent Labs Lab 06/02/16 1814  LIPASE 54*   No results for input(s): AMMONIA in the last 168 hours. CBC:  Recent Labs Lab 06/02/16 1814 06/03/16 0924 06/04/16 0208 06/05/16 1119 06/06/16 0840  WBC 13.0* 10.7* 12.8* 13.3* 16.7*  NEUTROABS  --   --  5.8 7.4 PENDING  HGB 7.6* 6.1* 6.0* 6.2* 6.2*  HCT 23.5* 19.0* 18.3* 18.7* 19.5*  MCV 66.8* 68.1* 66.1* 66.1*  66.1*  PLT 348 284 290 319 PLATELETS APPEAR ADEQUATE   Cardiac Enzymes:  Recent Labs Lab 06/02/16 1814  CKTOTAL 102   CBG:  Recent Labs Lab 06/05/16 1713 06/05/16 2053 06/06/16 0732 06/06/16 1210 06/06/16 1251  GLUCAP 157* 221* 146* 158* 158*    Iron Studies:  Recent Labs  06/03/16 1415  IRON 92  TIBC 298  FERRITIN 170   Studies/Results: No results found. Marland Kitchen amLODipine  10 mg Oral Daily  . darbepoetin (ARANESP) injection - NON-DIALYSIS  60 mcg Subcutaneous Q14 Days  . folic acid  1 mg Oral Daily  . furosemide  40 mg Oral Daily  .  gabapentin  300 mg Oral QHS  . heparin  5,000 Units Subcutaneous Q8H  . insulin aspart  0-9 Units Subcutaneous TID WC  . lanthanum  1,000 mg Oral TID WC  . pantoprazole  40 mg Oral Daily    BMET    Component Value Date/Time   NA 137 06/06/2016 0840   K 4.6 06/06/2016 0840   CL 107 06/06/2016 0840   CO2 24 06/06/2016 0840   GLUCOSE 162 (H) 06/06/2016 0840   BUN 34 (H) 06/06/2016 0840   CREATININE 4.12 (H) 06/06/2016 0840   CALCIUM 9.6 06/06/2016 0840   GFRNONAA 14 (L) 06/06/2016 0840   GFRAA 16 (L) 06/06/2016 0840   CBC    Component Value Date/Time   WBC 16.7 (H) 06/06/2016 0840   RBC 2.95 (L) 06/06/2016 0840   HGB 6.2 (LL) 06/06/2016 0840   HCT 19.5 (L) 06/06/2016 0840   HCT 21.9 (L) 09/03/2014 1200   PLT PLATELETS APPEAR ADEQUATE 06/06/2016 0840   MCV 66.1 (L) 06/06/2016 0840   MCH 21.0 (L) 06/06/2016 0840   MCHC 31.8 06/06/2016 0840   RDW 17.0 (H) 06/06/2016 0840   LYMPHSABS PENDING 06/06/2016 0840   MONOABS PENDING 06/06/2016 0840   EOSABS PENDING 06/06/2016 0840   BASOSABS PENDING 06/06/2016 0840     Assessment/Plan:  1. CKD stage 4 most likely secondary to HTN +/- DM/Sickle cell disease. Scr has been above 3 since 2016 and renal US showed bilateral echogenic kidneys. Need to educate about dialysis and stress better control of DM and HTN. Would not add ACE-inhibition due to hyperkalemia and advanced  CKD. 1. Scr stabilized around 4 2. Hyperkalemia- due to CKD and excessive potassium intake. Improved with lasix. Educate on low K diet. 3. Anemia- due to SCA and CKD. Will start ESA, iron stores stable. 4. HTN- improved. 5. Secondary HPTH- will need to start phospate binders. iPTh pending. 6. Cocaine abuse- offer counseling and stressed abstinence. 7. Disposition- will need close follow up as and outpatient and will need [facility] referral for vein mapping and access placement (this can be done as an outpatient).  Will set up outpatient follow up with me on June 14, 2016 at 1:45 pm at our office.  Donetta Potts, MD [employer] [phone]

## 2016-06-15 ENCOUNTER — Other Ambulatory Visit: Payer: Self-pay | Admitting: *Deleted

## 2016-06-15 DIAGNOSIS — N185 Chronic kidney disease, stage 5: Secondary | ICD-10-CM

## 2016-06-15 DIAGNOSIS — Z01810 Encounter for preprocedural cardiovascular examination: Secondary | ICD-10-CM

## 2016-06-20 ENCOUNTER — Other Ambulatory Visit: Payer: Self-pay | Admitting: Internal Medicine

## 2016-07-11 ENCOUNTER — Encounter: Payer: Self-pay | Admitting: Surgery

## 2016-07-17 ENCOUNTER — Ambulatory Visit (HOSPITAL_COMMUNITY)
Admission: RE | Admit: 2016-07-17 | Discharge: 2016-07-17 | Disposition: A | Discharge status: Home / Self Care | Payer: Medicare HMO | Source: Ambulatory Visit | Attending: Surgery | Admitting: Surgery

## 2016-07-17 ENCOUNTER — Encounter: Payer: Self-pay | Admitting: Surgery

## 2016-07-17 ENCOUNTER — Ambulatory Visit (INDEPENDENT_AMBULATORY_CARE_PROVIDER_SITE_OTHER): Payer: Medicare HMO | Admitting: Surgery

## 2016-07-17 ENCOUNTER — Ambulatory Visit (INDEPENDENT_AMBULATORY_CARE_PROVIDER_SITE_OTHER)
Admission: RE | Admit: 2016-07-17 | Discharge: 2016-07-17 | Disposition: A | Discharge status: Home / Self Care | Payer: Medicare HMO | Source: Ambulatory Visit | Attending: Surgery | Admitting: Surgery

## 2016-07-17 VITALS — BP 144/82 | HR 76 | Temp 97.1°F | Resp 20 | Ht 73.0 in | Wt 168.4 lb

## 2016-07-17 DIAGNOSIS — N185 Chronic kidney disease, stage 5: Secondary | ICD-10-CM

## 2016-07-17 DIAGNOSIS — Z01810 Encounter for preprocedural cardiovascular examination: Secondary | ICD-10-CM | POA: Insufficient documentation

## 2016-07-17 NOTE — Progress Notes (Signed)
.                                    [facility] [place]  Patient name: [name] MRN: [id number] DOB: 06/29/1950 Sex: male   REFERRING PROVIDER:    Dr. Marval Regal   REASON FOR CONSULT:    Dialysis Access  HISTORY OF PRESENT ILLNESS:   [name] is a 66 y.o. male, who is Referred today for evaluation of dialysis access.  He suffers from stage V chronic renal insufficiency secondary to diabetes, hypertension, cocaine abuse, and sickle cell anemia.  His renal insufficiency is complicated by secondary hyperparathyroidism his hypertension and diabetes are poorly controlled.  He is right-handed.  PAST MEDICAL HISTORY    Past Medical History:  Diagnosis Date  . Diabetes mellitus   . Hypertension   . Renal insufficiency   . Sickle cell anemia (HCC)    Hgb SS disease      FAMILY HISTORY   Family History  Problem Relation Age of Onset  . Venous thrombosis Mother     Dec. of blood clot   . Diabetes type II Mother   . AAA (abdominal aortic aneurysm) Mother   . Throat cancer Father     Deceased of throat cancer    SOCIAL HISTORY:   Social History   Social History  . Marital status: Divorced    Spouse name: N/A  . Number of children: N/A  . Years of education: N/A   Occupational History  . Not on file.   Social History Main Topics  . Smoking status: Former Smoker    Packs/day: 0.50    Years: 20.00    Quit date: 06/05/1966  . Smokeless tobacco: Never Used  . Alcohol use Yes     Comment: 1-2 times per week  . Drug use: Yes    Types: Cocaine     Comment: monday - smoke crack  . Sexual activity: Not on file   Other Topics Concern  . Not on file   Social History Narrative   Retired Administrator, on disability. Former smoker. Living by himself, able to get around [facility].    Has a wife who checks on him but they don't live together sicne 2007   Quit drinking     ALLERGIES:    Allergies  Allergen Reactions  . Morphine And  Related Itching    CURRENT MEDICATIONS:    Current Outpatient Prescriptions  Medication Sig Dispense Refill  . amLODipine (NORVASC) 10 MG tablet Take 1 tablet (10 mg total) by mouth daily. 277 tablet 0  . folic acid (FOLVITE) 1 MG tablet Take 1 tablet (1 mg total) by mouth daily. 30 tablet 0  . furosemide (LASIX) 40 MG tablet Take 1 tablet (40 mg total) by mouth daily. 30 tablet 0  . gabapentin (NEURONTIN) 300 MG capsule Take 300 mg by mouth 3 (three) times daily.    Marland Kitchen glipiZIDE (GLUCOTROL XL) 10 MG 24 hr tablet Take 10 mg by mouth 2 (two) times daily.    Marland Kitchen LANTUS SOLOSTAR 100 UNIT/ML Solostar Pen     . NOVOLOG FLEXPEN 100 UNIT/ML FlexPen     . omeprazole (PRILOSEC) 20 MG capsule Take 20 mg by mouth daily.   5  . Sucroferric Oxyhydroxide (VELPHORO PO) Take by mouth.    Marland Kitchen aspirin EC 325 MG tablet Take 325 mg by mouth daily.    Marland Kitchen  lanthanum (FOSRENOL) 1000 MG chewable tablet Chew 1 tablet (1,000 mg total) by mouth 3 (three) times daily with meals. (Patient not taking: Reported on 07/17/2016) 90 tablet 0  . oxyCODONE-acetaminophen (PERCOCET/ROXICET) 5-325 MG tablet Take 1 tablet by mouth every 4 (four) hours as needed for moderate pain. (Patient not taking: Reported on 07/17/2016) 30 tablet 0   No current facility-administered medications for this visit.     REVIEW OF SYSTEMS:   [X]  denotes positive finding, [ ]  denotes negative finding Cardiac  Comments:  Chest pain or chest pressure:    Shortness of breath upon exertion:    Short of breath when lying flat:    Irregular heart rhythm: x       Vascular    Pain in calf, thigh, or hip brought on by ambulation:    Pain in feet at night that wakes you up from your sleep:  x   Blood clot in your veins:    Leg swelling:         Pulmonary    Oxygen at home:    Productive cough:     Wheezing:         Neurologic    Sudden weakness in arms or legs:     Sudden numbness in arms or legs:     Sudden onset of difficulty speaking or slurred  speech:    Temporary loss of vision in one eye:  x   Problems with dizziness:         Gastrointestinal    Blood in stool:      Vomited blood:         Genitourinary    Burning when urinating:     Blood in urine:        Psychiatric    Major depression:         Hematologic    Bleeding problems:    Problems with blood clotting too easily:        Skin    Rashes or ulcers:        Constitutional    Fever or chills:     PHYSICAL EXAM:   Vitals:   07/17/16 1010 07/17/16 1011  BP: (!) 146/85 (!) 144/82  Pulse: 76   Resp: 20   Temp: 97.1 F (36.2 C)   TempSrc: Oral   SpO2: 98%   Weight: 168 lb 6.4 oz (76.4 kg)   Height: 6\' 1"  (1.854 m)     GENERAL: The patient is a well-nourished male, in no acute distress. The vital signs are documented above. CARDIAC: There is a regular rate and rhythm.  VASCULAR: Palpable left radial and brachial pulse PULMONARY: Nonlabored respirations MUSCULOSKELETAL: There are no major deformities or cyanosis. NEUROLOGIC: No focal weakness or paresthesias are detected. SKIN: There are no ulcers or rashes noted. PSYCHIATRIC: The patient has a normal affect.  STUDIES:   Vein mapping today shows suboptimal cephalic and basilic veins for fistula creation. Arterial evaluation was normal  ASSESSMENT and PLAN   Stage V renal insufficiency: The patient is not a candidate for fistula creation based off of his vein mapping studies today.  Therefore I will consider placing a left forearm versus upper arm graft when the patient is closer to dialysis.  The risks and benefits of the operation were discussed with the patient.  Once Dr. Marval Regal states that he would like to have this done, he can be scheduled without having him brought back to the office.   Annamarie Major, MD [facility] [phone] Pager [phone]

## 2016-08-10 ENCOUNTER — Other Ambulatory Visit: Payer: Self-pay | Admitting: Nephrology

## 2016-08-10 ENCOUNTER — Ambulatory Visit
Admission: RE | Admit: 2016-08-10 | Discharge: 2016-08-10 | Disposition: A | Discharge status: Home / Self Care | Payer: Medicare HMO | Source: Ambulatory Visit | Attending: Nephrology | Admitting: Nephrology

## 2016-08-10 DIAGNOSIS — R0602 Shortness of breath: Secondary | ICD-10-CM

## 2016-10-27 ENCOUNTER — Encounter (HOSPITAL_COMMUNITY): Payer: Self-pay | Admitting: Emergency Medicine

## 2016-10-27 DIAGNOSIS — N184 Chronic kidney disease, stage 4 (severe): Secondary | ICD-10-CM | POA: Diagnosis not present

## 2016-10-27 DIAGNOSIS — K5900 Constipation, unspecified: Secondary | ICD-10-CM | POA: Diagnosis not present

## 2016-10-27 DIAGNOSIS — B349 Viral infection, unspecified: Secondary | ICD-10-CM | POA: Diagnosis not present

## 2016-10-27 DIAGNOSIS — Z79899 Other long term (current) drug therapy: Secondary | ICD-10-CM | POA: Insufficient documentation

## 2016-10-27 DIAGNOSIS — Z7982 Long term (current) use of aspirin: Secondary | ICD-10-CM | POA: Insufficient documentation

## 2016-10-27 DIAGNOSIS — E1122 Type 2 diabetes mellitus with diabetic chronic kidney disease: Secondary | ICD-10-CM | POA: Diagnosis not present

## 2016-10-27 DIAGNOSIS — Z794 Long term (current) use of insulin: Secondary | ICD-10-CM | POA: Diagnosis not present

## 2016-10-27 DIAGNOSIS — I129 Hypertensive chronic kidney disease with stage 1 through stage 4 chronic kidney disease, or unspecified chronic kidney disease: Secondary | ICD-10-CM | POA: Diagnosis not present

## 2016-10-27 DIAGNOSIS — Z87891 Personal history of nicotine dependence: Secondary | ICD-10-CM | POA: Insufficient documentation

## 2016-10-27 DIAGNOSIS — R531 Weakness: Secondary | ICD-10-CM | POA: Diagnosis present

## 2016-10-27 LAB — CBC
HCT: 23.7 % — ABNORMAL LOW (ref 39.0–52.0)
Hemoglobin: 7.4 g/dL — ABNORMAL LOW (ref 13.0–17.0)
MCH: 20.8 pg — ABNORMAL LOW (ref 26.0–34.0)
MCHC: 31.2 g/dL (ref 30.0–36.0)
MCV: 66.8 fL — ABNORMAL LOW (ref 78.0–100.0)
PLATELETS: 263 10*3/uL (ref 150–400)
RBC: 3.55 MIL/uL — AB (ref 4.22–5.81)
RDW: 16.5 % — AB (ref 11.5–15.5)
WBC: 8.6 10*3/uL (ref 4.0–10.5)

## 2016-10-27 LAB — CBG MONITORING, ED
GLUCOSE-CAPILLARY: 138 mg/dL — AB (ref 65–99)
GLUCOSE-CAPILLARY: 161 mg/dL — AB (ref 65–99)

## 2016-10-27 LAB — BASIC METABOLIC PANEL
Anion gap: 10 (ref 5–15)
BUN: 54 mg/dL — AB (ref 6–20)
CALCIUM: 9.5 mg/dL (ref 8.9–10.3)
CO2: 22 mmol/L (ref 22–32)
CREATININE: 4.72 mg/dL — AB (ref 0.61–1.24)
Chloride: 102 mmol/L (ref 101–111)
GFR calc Af Amer: 13 mL/min — ABNORMAL LOW (ref >60)
GFR, EST NON AFRICAN AMERICAN: 12 mL/min — AB (ref >60)
Glucose, Bld: 137 mg/dL — ABNORMAL HIGH (ref 65–99)
Potassium: 4.3 mmol/L (ref 3.5–5.1)
SODIUM: 134 mmol/L — AB (ref 135–145)

## 2016-10-27 LAB — I-STAT CG4 LACTIC ACID, ED: Lactic Acid, Venous: 1.27 mmol/L (ref 0.5–1.9)

## 2016-10-27 NOTE — ED Notes (Signed)
Patient and wife updated on delays

## 2016-10-27 NOTE — ED Notes (Signed)
Checked CBG 161, RN Katie informed

## 2016-10-27 NOTE — ED Triage Notes (Addendum)
Pt presents to ED for assessment of generalized weakness x  3 days with malaise, generalized body aches, and hypoglycemia.  Pt CBG WNL at triage.  Pt denies specific pain or specific symptoms.  Denies n/v/d.  Denies CP.  Denies SOB.  Pt also states he was told 3 weeks ago that he may need a blood transfusion, but has not had any follow-up since.

## 2016-10-28 ENCOUNTER — Emergency Department (HOSPITAL_COMMUNITY): Payer: Medicare HMO

## 2016-10-28 ENCOUNTER — Emergency Department (HOSPITAL_COMMUNITY)
Admission: EM | Admit: 2016-10-28 | Discharge: 2016-10-28 | Disposition: A | Discharge status: Home / Self Care | Payer: Medicare HMO | Attending: Emergency Medicine | Admitting: Emergency Medicine

## 2016-10-28 ENCOUNTER — Encounter (HOSPITAL_COMMUNITY): Payer: Self-pay | Admitting: Emergency Medicine

## 2016-10-28 DIAGNOSIS — K5900 Constipation, unspecified: Secondary | ICD-10-CM

## 2016-10-28 DIAGNOSIS — B349 Viral infection, unspecified: Secondary | ICD-10-CM

## 2016-10-28 LAB — URINALYSIS, ROUTINE W REFLEX MICROSCOPIC
Bacteria, UA: NONE SEEN
Bilirubin Urine: NEGATIVE
GLUCOSE, UA: NEGATIVE mg/dL
Hgb urine dipstick: NEGATIVE
Ketones, ur: NEGATIVE mg/dL
Leukocytes, UA: NEGATIVE
Nitrite: NEGATIVE
PROTEIN: 30 mg/dL — AB
SQUAMOUS EPITHELIAL / LPF: NONE SEEN
Specific Gravity, Urine: 1.012 (ref 1.005–1.030)
pH: 5 (ref 5.0–8.0)

## 2016-10-28 LAB — I-STAT TROPONIN, ED: Troponin i, poc: 0.03 ng/mL (ref 0.00–0.08)

## 2016-10-28 LAB — RAPID URINE DRUG SCREEN, HOSP PERFORMED
Amphetamines: NOT DETECTED
BARBITURATES: NOT DETECTED
BENZODIAZEPINES: NOT DETECTED
COCAINE: NOT DETECTED
Opiates: NOT DETECTED
Tetrahydrocannabinol: NOT DETECTED

## 2016-10-28 LAB — I-STAT CG4 LACTIC ACID, ED: Lactic Acid, Venous: 0.63 mmol/L (ref 0.5–1.9)

## 2016-10-28 LAB — CBG MONITORING, ED: Glucose-Capillary: 124 mg/dL — ABNORMAL HIGH (ref 65–99)

## 2016-10-28 MED ORDER — POLYETHYLENE GLYCOL 3350 17 GM/SCOOP PO POWD: 17.0000 g | Freq: Every day | ORAL | 0 refills | Status: DC

## 2016-10-28 MED ORDER — GLUCOSE 40 % PO GEL: 1.0000 | Freq: Once | ORAL | Status: DC
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
## 2016-10-28 NOTE — ED Notes (Signed)
Patient able to hold down fluids at this time.

## 2016-11-04 ENCOUNTER — Emergency Department (HOSPITAL_COMMUNITY): Payer: Medicare HMO

## 2016-11-04 ENCOUNTER — Other Ambulatory Visit: Payer: Self-pay

## 2016-11-04 ENCOUNTER — Inpatient Hospital Stay (HOSPITAL_COMMUNITY)
Admission: EM | Admit: 2016-11-04 | Discharge: 2016-11-12 | DRG: 872 | Disposition: A | Discharge status: Home / Self Care | Payer: Medicare HMO | Attending: Internal Medicine | Admitting: Internal Medicine

## 2016-11-04 ENCOUNTER — Encounter (HOSPITAL_COMMUNITY): Payer: Self-pay

## 2016-11-04 DIAGNOSIS — E611 Iron deficiency: Secondary | ICD-10-CM | POA: Diagnosis present

## 2016-11-04 DIAGNOSIS — R062 Wheezing: Secondary | ICD-10-CM

## 2016-11-04 DIAGNOSIS — K560 Paralytic ileus: Secondary | ICD-10-CM | POA: Diagnosis present

## 2016-11-04 DIAGNOSIS — E875 Hyperkalemia: Secondary | ICD-10-CM | POA: Diagnosis not present

## 2016-11-04 DIAGNOSIS — N184 Chronic kidney disease, stage 4 (severe): Secondary | ICD-10-CM | POA: Diagnosis present

## 2016-11-04 DIAGNOSIS — N2581 Secondary hyperparathyroidism of renal origin: Secondary | ICD-10-CM | POA: Diagnosis present

## 2016-11-04 DIAGNOSIS — I447 Left bundle-branch block, unspecified: Secondary | ICD-10-CM | POA: Diagnosis present

## 2016-11-04 DIAGNOSIS — N185 Chronic kidney disease, stage 5: Secondary | ICD-10-CM | POA: Diagnosis not present

## 2016-11-04 DIAGNOSIS — R140 Abdominal distension (gaseous): Secondary | ICD-10-CM | POA: Diagnosis not present

## 2016-11-04 DIAGNOSIS — E1129 Type 2 diabetes mellitus with other diabetic kidney complication: Secondary | ICD-10-CM

## 2016-11-04 DIAGNOSIS — D571 Sickle-cell disease without crisis: Secondary | ICD-10-CM | POA: Diagnosis present

## 2016-11-04 DIAGNOSIS — F1410 Cocaine abuse, uncomplicated: Secondary | ICD-10-CM | POA: Diagnosis present

## 2016-11-04 DIAGNOSIS — K219 Gastro-esophageal reflux disease without esophagitis: Secondary | ICD-10-CM | POA: Diagnosis present

## 2016-11-04 DIAGNOSIS — D72829 Elevated white blood cell count, unspecified: Secondary | ICD-10-CM | POA: Diagnosis not present

## 2016-11-04 DIAGNOSIS — I129 Hypertensive chronic kidney disease with stage 1 through stage 4 chronic kidney disease, or unspecified chronic kidney disease: Secondary | ICD-10-CM | POA: Diagnosis present

## 2016-11-04 DIAGNOSIS — D638 Anemia in other chronic diseases classified elsewhere: Secondary | ICD-10-CM | POA: Diagnosis present

## 2016-11-04 DIAGNOSIS — A419 Sepsis, unspecified organism: Secondary | ICD-10-CM | POA: Diagnosis present

## 2016-11-04 DIAGNOSIS — E871 Hypo-osmolality and hyponatremia: Secondary | ICD-10-CM | POA: Diagnosis present

## 2016-11-04 DIAGNOSIS — K567 Ileus, unspecified: Secondary | ICD-10-CM | POA: Diagnosis not present

## 2016-11-04 DIAGNOSIS — B349 Viral infection, unspecified: Secondary | ICD-10-CM | POA: Diagnosis present

## 2016-11-04 DIAGNOSIS — I361 Nonrheumatic tricuspid (valve) insufficiency: Secondary | ICD-10-CM | POA: Diagnosis not present

## 2016-11-04 DIAGNOSIS — Z794 Long term (current) use of insulin: Secondary | ICD-10-CM

## 2016-11-04 DIAGNOSIS — Z9049 Acquired absence of other specified parts of digestive tract: Secondary | ICD-10-CM

## 2016-11-04 DIAGNOSIS — K5900 Constipation, unspecified: Secondary | ICD-10-CM | POA: Diagnosis present

## 2016-11-04 DIAGNOSIS — R1084 Generalized abdominal pain: Secondary | ICD-10-CM | POA: Diagnosis not present

## 2016-11-04 DIAGNOSIS — E1122 Type 2 diabetes mellitus with diabetic chronic kidney disease: Secondary | ICD-10-CM | POA: Diagnosis present

## 2016-11-04 DIAGNOSIS — R7989 Other specified abnormal findings of blood chemistry: Secondary | ICD-10-CM | POA: Diagnosis present

## 2016-11-04 DIAGNOSIS — Z79899 Other long term (current) drug therapy: Secondary | ICD-10-CM

## 2016-11-04 DIAGNOSIS — R0602 Shortness of breath: Secondary | ICD-10-CM

## 2016-11-04 DIAGNOSIS — D631 Anemia in chronic kidney disease: Secondary | ICD-10-CM | POA: Diagnosis present

## 2016-11-04 DIAGNOSIS — Z87891 Personal history of nicotine dependence: Secondary | ICD-10-CM | POA: Diagnosis not present

## 2016-11-04 DIAGNOSIS — N2589 Other disorders resulting from impaired renal tubular function: Secondary | ICD-10-CM | POA: Diagnosis present

## 2016-11-04 DIAGNOSIS — Z85038 Personal history of other malignant neoplasm of large intestine: Secondary | ICD-10-CM | POA: Diagnosis not present

## 2016-11-04 DIAGNOSIS — R1010 Upper abdominal pain, unspecified: Secondary | ICD-10-CM | POA: Diagnosis not present

## 2016-11-04 DIAGNOSIS — D5700 Hb-SS disease with crisis, unspecified: Secondary | ICD-10-CM | POA: Diagnosis not present

## 2016-11-04 DIAGNOSIS — E872 Acidosis: Secondary | ICD-10-CM | POA: Diagnosis present

## 2016-11-04 DIAGNOSIS — R0902 Hypoxemia: Secondary | ICD-10-CM | POA: Diagnosis present

## 2016-11-04 DIAGNOSIS — R109 Unspecified abdominal pain: Secondary | ICD-10-CM | POA: Diagnosis present

## 2016-11-04 DIAGNOSIS — Z885 Allergy status to narcotic agent status: Secondary | ICD-10-CM

## 2016-11-04 DIAGNOSIS — R6510 Systemic inflammatory response syndrome (SIRS) of non-infectious origin without acute organ dysfunction: Secondary | ICD-10-CM | POA: Diagnosis not present

## 2016-11-04 LAB — COMPREHENSIVE METABOLIC PANEL
ALT: 14 U/L — ABNORMAL LOW (ref 17–63)
ANION GAP: 10 (ref 5–15)
AST: 27 U/L (ref 15–41)
Albumin: 3.2 g/dL — ABNORMAL LOW (ref 3.5–5.0)
Alkaline Phosphatase: 70 U/L (ref 38–126)
BUN: 50 mg/dL — ABNORMAL HIGH (ref 6–20)
CO2: 20 mmol/L — AB (ref 22–32)
Calcium: 8.9 mg/dL (ref 8.9–10.3)
Chloride: 101 mmol/L (ref 101–111)
Creatinine, Ser: 4.63 mg/dL — ABNORMAL HIGH (ref 0.61–1.24)
GFR calc Af Amer: 14 mL/min — ABNORMAL LOW (ref >60)
GFR calc non Af Amer: 12 mL/min — ABNORMAL LOW (ref >60)
GLUCOSE: 174 mg/dL — AB (ref 65–99)
POTASSIUM: 4.5 mmol/L (ref 3.5–5.1)
SODIUM: 131 mmol/L — AB (ref 135–145)
TOTAL PROTEIN: 7.5 g/dL (ref 6.5–8.1)
Total Bilirubin: 1.3 mg/dL — ABNORMAL HIGH (ref 0.3–1.2)

## 2016-11-04 LAB — URINALYSIS, ROUTINE W REFLEX MICROSCOPIC
Bilirubin Urine: NEGATIVE
Glucose, UA: NEGATIVE mg/dL
Hgb urine dipstick: NEGATIVE
KETONES UR: NEGATIVE mg/dL
LEUKOCYTES UA: NEGATIVE
NITRITE: NEGATIVE
PH: 5 (ref 5.0–8.0)
Protein, ur: NEGATIVE mg/dL
SPECIFIC GRAVITY, URINE: 1.01 (ref 1.005–1.030)

## 2016-11-04 LAB — CBC
HCT: 20.7 % — ABNORMAL LOW (ref 39.0–52.0)
Hemoglobin: 6.7 g/dL — CL (ref 13.0–17.0)
MCH: 20.9 pg — ABNORMAL LOW (ref 26.0–34.0)
MCHC: 32.4 g/dL (ref 30.0–36.0)
MCV: 64.7 fL — ABNORMAL LOW (ref 78.0–100.0)
PLATELETS: 398 10*3/uL (ref 150–400)
RBC: 3.2 MIL/uL — AB (ref 4.22–5.81)
RDW: 17.4 % — ABNORMAL HIGH (ref 11.5–15.5)
WBC: 21.5 10*3/uL — AB (ref 4.0–10.5)

## 2016-11-04 LAB — I-STAT TROPONIN, ED
TROPONIN I, POC: 0.02 ng/mL (ref 0.00–0.08)
TROPONIN I, POC: 0.02 ng/mL (ref 0.00–0.08)

## 2016-11-04 LAB — I-STAT CG4 LACTIC ACID, ED
LACTIC ACID, VENOUS: 1.48 mmol/L (ref 0.5–1.9)
LACTIC ACID, VENOUS: 1.02 mmol/L (ref 0.5–1.9)

## 2016-11-04 LAB — LIPASE, BLOOD: Lipase: 31 U/L (ref 11–51)

## 2016-11-04 MED ORDER — POLYETHYLENE GLYCOL 3350 17 G PO PACK
17.0000 g | PACK | Freq: Every day | ORAL | Status: DC | PRN
  Administered 2016-11-11: 17 g via ORAL
  Filled 2016-11-04: qty 1

## 2016-11-04 MED ORDER — AMLODIPINE BESYLATE 10 MG PO TABS
10.0000 mg | ORAL_TABLET | Freq: Every day | ORAL | Status: DC
Start: 2016-11-05 — End: 2016-11-12
  Administered 2016-11-05 – 2016-11-12 (×8): 10 mg via ORAL
  Filled 2016-11-04 (×8): qty 1

## 2016-11-04 MED ORDER — PANTOPRAZOLE SODIUM 40 MG PO TBEC
40.0000 mg | DELAYED_RELEASE_TABLET | Freq: Every day | ORAL | Status: DC
  Administered 2016-11-05 – 2016-11-12 (×8): 40 mg via ORAL
  Filled 2016-11-04 (×8): qty 1

## 2016-11-04 MED ORDER — ACETAMINOPHEN 500 MG PO TABS
500.0000 mg | ORAL_TABLET | Freq: Four times a day (QID) | ORAL | Status: DC | PRN
  Administered 2016-11-05 – 2016-11-08 (×4): 500 mg via ORAL
  Filled 2016-11-04 (×4): qty 1

## 2016-11-04 MED ORDER — FOLIC ACID 1 MG PO TABS
1.0000 mg | ORAL_TABLET | Freq: Every day | ORAL | Status: DC
  Administered 2016-11-05 – 2016-11-07 (×3): 1 mg via ORAL
  Filled 2016-11-04 (×3): qty 1

## 2016-11-04 MED ORDER — VANCOMYCIN HCL 10 G IV SOLR
1500.0000 mg | Freq: Once | INTRAVENOUS | Status: AC
  Administered 2016-11-04: 1500 mg via INTRAVENOUS
  Filled 2016-11-04: qty 1500

## 2016-11-04 MED ORDER — ENOXAPARIN SODIUM 30 MG/0.3ML ~~LOC~~ SOLN
30.0000 mg | SUBCUTANEOUS | Status: DC
  Administered 2016-11-05 – 2016-11-11 (×7): 30 mg via SUBCUTANEOUS
  Filled 2016-11-04 (×7): qty 0.3

## 2016-11-04 MED ORDER — GABAPENTIN 300 MG PO CAPS
300.0000 mg | ORAL_CAPSULE | Freq: Three times a day (TID) | ORAL | Status: DC
  Administered 2016-11-05 – 2016-11-08 (×12): 300 mg via ORAL
  Filled 2016-11-04 (×12): qty 1

## 2016-11-04 MED ORDER — CARVEDILOL 12.5 MG PO TABS
12.5000 mg | ORAL_TABLET | Freq: Two times a day (BID) | ORAL | Status: DC
Start: 2016-11-05 — End: 2016-11-12
  Administered 2016-11-05 – 2016-11-12 (×15): 12.5 mg via ORAL
  Filled 2016-11-04 (×15): qty 1

## 2016-11-04 MED ORDER — HYDROMORPHONE HCL 1 MG/ML IJ SOLN
1.0000 mg | INTRAMUSCULAR | Status: DC | PRN
  Administered 2016-11-04 – 2016-11-08 (×13): 1 mg via INTRAVENOUS
  Filled 2016-11-04 (×13): qty 1

## 2016-11-04 MED ORDER — INSULIN ASPART 100 UNIT/ML ~~LOC~~ SOLN: 0.0000 [IU] | Freq: Three times a day (TID) | SUBCUTANEOUS | Status: DC

## 2016-11-04 MED ORDER — SODIUM CHLORIDE 0.9 % IV SOLN
INTRAVENOUS | Status: DC
  Administered 2016-11-05 – 2016-11-06 (×2): via INTRAVENOUS

## 2016-11-04 MED ORDER — ACETAMINOPHEN 325 MG PO TABS
ORAL_TABLET | ORAL | Status: AC
  Filled 2016-11-04: qty 2

## 2016-11-04 MED ORDER — INSULIN GLARGINE 100 UNIT/ML ~~LOC~~ SOLN
10.0000 [IU] | Freq: Every day | SUBCUTANEOUS | Status: DC
  Administered 2016-11-05 – 2016-11-11 (×8): 10 [IU] via SUBCUTANEOUS
  Filled 2016-11-04 (×9): qty 0.1

## 2016-11-04 MED ORDER — PIPERACILLIN-TAZOBACTAM 3.375 G IVPB 30 MIN
3.3750 g | Freq: Once | INTRAVENOUS | Status: AC
  Administered 2016-11-04: 3.375 g via INTRAVENOUS
  Filled 2016-11-04: qty 50

## 2016-11-04 MED ORDER — ONDANSETRON HCL 4 MG PO TABS
4.0000 mg | ORAL_TABLET | Freq: Four times a day (QID) | ORAL | Status: DC | PRN
  Administered 2016-11-12: 4 mg via ORAL
  Filled 2016-11-04: qty 1

## 2016-11-04 MED ORDER — OXYCODONE-ACETAMINOPHEN 5-325 MG PO TABS: 1.0000 | ORAL_TABLET | ORAL | Status: DC | PRN

## 2016-11-04 MED ORDER — ONDANSETRON HCL 4 MG/2ML IJ SOLN: 4.0000 mg | Freq: Four times a day (QID) | INTRAMUSCULAR | Status: DC | PRN

## 2016-11-04 MED ORDER — SENNA 8.6 MG PO TABS
1.0000 | ORAL_TABLET | Freq: Every evening | ORAL | Status: DC | PRN
  Filled 2016-11-04: qty 1

## 2016-11-04 MED ORDER — ACETAMINOPHEN 325 MG PO TABS
650.0000 mg | ORAL_TABLET | Freq: Once | ORAL | Status: AC
  Administered 2016-11-04: 650 mg via ORAL

## 2016-11-04 MED ORDER — CALCIUM ACETATE (PHOS BINDER) 667 MG PO CAPS
667.0000 mg | ORAL_CAPSULE | Freq: Three times a day (TID) | ORAL | Status: DC
  Administered 2016-11-05 – 2016-11-12 (×23): 667 mg via ORAL
  Filled 2016-11-04 (×23): qty 1

## 2016-11-04 NOTE — ED Notes (Signed)
IV team at bedside attempting to place IV and draw blood.

## 2016-11-04 NOTE — ED Notes (Signed)
Admitting Provider at bedside. 

## 2016-11-04 NOTE — H&P (Signed)
History and Physical    [name] JZP:[id number] DOB: 1951-06-01 DOA: 11/04/2016  PCP: Nolene Ebbs, MD  Patient coming from: Home  I have personally briefly reviewed patient's old medical records in [place]  Chief Complaint: Abd pain  HPI: [name] is a 66 y.o. male with medical history significant of sickle cell disease, DM, HTN, CKD stage 4-5 preparing for dialysis in near future.  Patient presents to the ED with c/o diffuse abd pain and distention.  Pain is severe, unchanging, onset on Thursday and has persisted since that time.  No dysuria.  Seen once in ED for constipation on the 26th, but laxatives havent helped, last BM was Tuesday.   ED Course: CT abd/pelvis shows no acute process.  Lactate was 1.48 followed by 1.0.  WBC 21.5k, HGB 6.7 (baseline is between 6 and 7).  Tm 102.7.   Review of Systems: As per HPI otherwise 10 point review of systems negative.   Past Medical History:  Diagnosis Date  . Diabetes mellitus   . Hypertension   . Renal insufficiency   . Sickle cell anemia (HCC)    Hgb SS disease     Past Surgical History:  Procedure Laterality Date  . CHOLECYSTECTOMY    . COLON SURGERY     Colon cancer surgery, removed small amt not full colectomy  . COLONOSCOPY     nclear who follows him for this  . LEG SURGERY     For ? gangrene after running into barbwire fence at 66yo     reports that he quit smoking about 50 years ago. He has a 10.00 pack-year smoking history. He has never used smokeless tobacco. He reports that he drinks alcohol. He reports that he uses drugs, including Cocaine.  Allergies  Allergen Reactions  . Morphine And Related Itching    Family History  Problem Relation Age of Onset  . Venous thrombosis Mother        Dec. of blood clot   . Diabetes type II Mother   . AAA (abdominal aortic aneurysm) Mother   . Throat cancer Father        Deceased of throat cancer     Prior to Admission medications   Medication Sig  Start Date End Date Taking? Authorizing Provider  acetaminophen (TYLENOL) 500 MG tablet Take 500 mg by mouth every 6 (six) hours as needed for headache (pain).   Yes [provider]  amLODipine (NORVASC) 10 MG tablet Take 1 tablet (10 mg total) by mouth daily. 06/07/16  Yes Leana Gamer, MD  calcium acetate (PHOSLO) 667 MG tablet Take 667 mg by mouth 3 (three) times daily. 09/26/16  Yes [provider]  carvedilol (COREG) 12.5 MG tablet Take 12.5 mg by mouth 2 (two) times daily. 09/18/16  Yes [provider]  Docusate Calcium (STOOL SOFTENER PO) Take 1 capsule by mouth daily as needed (constipation).   Yes [provider]  folic acid (FOLVITE) 1 MG tablet Take 1 tablet (1 mg total) by mouth daily. 04/18/13  Yes Robbie Lis, MD  furosemide (LASIX) 40 MG tablet Take 1 tablet (40 mg total) by mouth daily. 06/07/16  Yes Leana Gamer, MD  gabapentin (NEURONTIN) 300 MG capsule Take 300 mg by mouth 3 (three) times daily.   Yes [provider]  glipiZIDE (GLUCOTROL XL) 10 MG 24 hr tablet Take 10 mg by mouth daily with breakfast.    Yes [provider]  insulin aspart (NOVOLOG  FLEXPEN) 100 UNIT/ML FlexPen Inject 2-6 Units into the skin 3 (three) times daily as needed for high blood sugar (CBG >150).   Yes [provider]  insulin glargine (LANTUS) 100 unit/mL SOPN Inject 20 Units into the skin at bedtime.   Yes [provider]  omeprazole (PRILOSEC) 20 MG capsule Take 20 mg by mouth daily.    Yes [provider]  oxyCODONE-acetaminophen (PERCOCET/ROXICET) 5-325 MG tablet Take 1 tablet by mouth every 4 (four) hours as needed for moderate pain. 06/06/16  Yes Leana Gamer, MD  polyethylene glycol powder (MIRALAX) powder Take 17 g by mouth daily. Patient taking differently: Take 17 g by mouth daily as needed (constipation). Mix in 8 oz liquid and drink 10/28/16  Yes Palumbo, April, MD  saxagliptin HCl (ONGLYZA) 5 MG  TABS tablet Take 5 mg by mouth daily.   Yes [provider]  Sennosides (SENNA LAXATIVE PO) Take 1 tablet by mouth daily as needed (constipation).   Yes [provider]    Physical Exam: Vitals:   11/04/16 2145 11/04/16 2200 11/04/16 2215 11/04/16 2318  BP: (!) 150/76 (!) 154/86 (!) 163/87   Pulse: 72 73 76   Resp: 17 17 13    Temp:    99.7 F (37.6 C)  TempSrc:    Oral  SpO2: (!) 89% 93% 97%   Weight:      Height:        Constitutional: NAD, calm, comfortable Eyes: PERRL, lids and conjunctivae normal ENMT: Mucous membranes are moist. Posterior pharynx clear of any exudate or lesions.Normal dentition.  Neck: normal, supple, no masses, no thyromegaly Respiratory: clear to auscultation bilaterally, no wheezing, no crackles. Normal respiratory effort. No accessory muscle use.  Cardiovascular: Regular rate and rhythm, no murmurs / rubs / gallops. No extremity edema. 2+ pedal pulses. No carotid bruits.  Abdomen: Diffuse tenderness, guarding, and rebound. Musculoskeletal: no clubbing / cyanosis. No joint deformity upper and lower extremities. Good ROM, no contractures. Normal muscle tone.  Skin: no rashes, lesions, ulcers. No induration Neurologic: CN 2-12 grossly intact. Sensation intact, DTR normal. Strength 5/5 in all 4.  Psychiatric: Normal judgment and insight. Alert and oriented x 3. Normal mood.    Labs on Admission: I have personally reviewed following labs and imaging studies  CBC:  Recent Labs Lab 11/04/16 1920  WBC 21.5*  HGB 6.7*  HCT 20.7*  MCV 64.7*  PLT 353   Basic Metabolic Panel:  Recent Labs Lab 11/04/16 1920  NA 131*  K 4.5  CL 101  CO2 20*  GLUCOSE 174*  BUN 50*  CREATININE 4.63*  CALCIUM 8.9   GFR: Estimated Creatinine Clearance: 17.3 mL/min (A) (by C-G formula based on SCr of 4.63 mg/dL (H)). Liver Function Tests:  Recent Labs Lab 11/04/16 1920  AST 27  ALT 14*  ALKPHOS 70  BILITOT 1.3*  PROT 7.5  ALBUMIN 3.2*     Recent Labs Lab 11/04/16 1920  LIPASE 31   No results for input(s): AMMONIA in the last 168 hours. Coagulation Profile: No results for input(s): INR, PROTIME in the last 168 hours. Cardiac Enzymes: No results for input(s): CKTOTAL, CKMB, CKMBINDEX, TROPONINI in the last 168 hours. BNP (last 3 results) No results for input(s): PROBNP in the last 8760 hours. HbA1C: No results for input(s): HGBA1C in the last 72 hours. CBG: No results for input(s): GLUCAP in the last 168 hours. Lipid Profile: No results for input(s): CHOL, HDL, LDLCALC, TRIG, CHOLHDL, LDLDIRECT in the last 72  hours. Thyroid Function Tests: No results for input(s): TSH, T4TOTAL, FREET4, T3FREE, THYROIDAB in the last 72 hours. Anemia Panel: No results for input(s): VITAMINB12, FOLATE, FERRITIN, TIBC, IRON, RETICCTPCT in the last 72 hours. Urine analysis:    Component Value Date/Time   COLORURINE YELLOW 11/04/2016 2123   APPEARANCEUR CLEAR 11/04/2016 2123   LABSPEC 1.010 11/04/2016 2123   PHURINE 5.0 11/04/2016 2123   GLUCOSEU NEGATIVE 11/04/2016 2123   HGBUR NEGATIVE 11/04/2016 2123   BILIRUBINUR NEGATIVE 11/04/2016 2123   KETONESUR NEGATIVE 11/04/2016 2123   PROTEINUR NEGATIVE 11/04/2016 2123   UROBILINOGEN 1.0 09/06/2014 2203   NITRITE NEGATIVE 11/04/2016 2123   LEUKOCYTESUR NEGATIVE 11/04/2016 2123    Radiological Exams on Admission: Ct Abdomen Pelvis Wo Contrast  Result Date: 11/04/2016 CLINICAL DATA:  Mid abdominal pain, distention EXAM: CT ABDOMEN AND PELVIS WITHOUT CONTRAST TECHNIQUE: Multidetector CT imaging of the abdomen and pelvis was performed following the standard protocol without IV contrast. COMPARISON:  CT 09/03/2003 FINDINGS: Lower chest: Small bilateral pleural effusions with dependent bibasilar opacities, likely atelectasis. Heart is normal size. Hepatobiliary: No focal liver abnormality is seen. Status post cholecystectomy. No biliary dilatation. Pancreas: No focal abnormality or ductal  dilatation. Spleen: Spleen is shrunken and diffusely calcified Adrenals/Urinary Tract: No renal or ureteral stones. No hydronephrosis. No focal renal abnormality. Adrenal glands and urinary bladder unremarkable. Stomach/Bowel: Normal appendix. Stomach, large and small bowel grossly unremarkable. Vascular/Lymphatic: No evidence of aneurysm or adenopathy. Reproductive: No visible focal abnormality. Other: No free fluid or free air. Musculoskeletal: Diffuse mottled appearance of the bony structures, likely related to sickle cell disease. This is unchanged since prior study. IMPRESSION: No acute findings in the abdomen or pelvis. No evidence of bowel obstruction. Prior cholecystectomy. Shrunken, calcified spleen. Diffuse sclerosis and mottled appearance of the bones. Findings likely related to patient's sickle cell disease. Electronically Signed   By: Rolm Baptise M.D.   On: 11/04/2016 22:52   Dg Chest 2 View  Result Date: 11/04/2016 CLINICAL DATA:  Low back pain with weakness EXAM: CHEST  2 VIEW COMPARISON:  10/28/2016, 08/20/2016 FINDINGS: No focal consolidation or pleural effusion. Stable cardiomediastinal silhouette. No pneumothorax. Heterogenous, patchy sclerosis within both humeral heads, unchanged. IMPRESSION: No active cardiopulmonary disease. Electronically Signed   By: Donavan Foil M.D.   On: 11/04/2016 20:07    EKG: Independently reviewed.  Assessment/Plan Principal Problem:   Abdominal pain Active Problems:   Sickle cell anemia (HCC)   Diabetes mellitus (HCC)   Hb-SS disease without crisis ([place])   Chronic kidney disease (CKD), stage IV (severe) ([place])   Sepsis ([place])    1. Abd pain and sepsis - 1. IVF: NS at 75 cc/hr 2. Tylenol PRN fever 3. Repeat CBC in AM 4. Empiric zosyn and vanc 5. CT abd/pelvis negative 6. Lactate negative 7. Thus no obvious surgical emergency nor indication at this point 2. Sickle cell anemia - HGB 6.7, near baseline 3. CKD stage 4-5 - currently at baseline,  to need dialysis soon 4. DM -  1. Hold home PO hypoglycemics 2. Sensitive scale SSI Q4H  DVT prophylaxis: Lovenox Code Status: Full Family Communication: Family at bedside Disposition Plan: Home after admit Consults called: None Admission status: Admit to inpatient - patient likely to require multiple days of hospitalization and further work up regarding cause of abd pain.   Etta Quill DO [facility] Pager [phone]  If 7AM-7PM, please contact day team taking care of patient [email or web address] Password [email or web address]  11/04/2016, 11:58 PM

## 2016-11-04 NOTE — ED Triage Notes (Signed)
Pt states LBM Tuesday. Pt also complaining of difficulty urinating. Pt complaining of pain in chest. Pt denies any burning/itching with urination, states simply difficult to urinate. Pt complaining of abdominal pain and constipation.

## 2016-11-04 NOTE — ED Notes (Signed)
Lab called with critical hgb 6.7

## 2016-11-05 DIAGNOSIS — A419 Sepsis, unspecified organism: Principal | ICD-10-CM

## 2016-11-05 DIAGNOSIS — D5700 Hb-SS disease with crisis, unspecified: Secondary | ICD-10-CM

## 2016-11-05 LAB — BASIC METABOLIC PANEL
Anion gap: 7 (ref 5–15)
BUN: 51 mg/dL — ABNORMAL HIGH (ref 6–20)
CALCIUM: 8.7 mg/dL — AB (ref 8.9–10.3)
CO2: 23 mmol/L (ref 22–32)
Chloride: 102 mmol/L (ref 101–111)
Creatinine, Ser: 4.53 mg/dL — ABNORMAL HIGH (ref 0.61–1.24)
GFR calc Af Amer: 14 mL/min — ABNORMAL LOW (ref >60)
GFR, EST NON AFRICAN AMERICAN: 12 mL/min — AB (ref >60)
Glucose, Bld: 167 mg/dL — ABNORMAL HIGH (ref 65–99)
Potassium: 4.5 mmol/L (ref 3.5–5.1)
SODIUM: 132 mmol/L — AB (ref 135–145)

## 2016-11-05 LAB — GLUCOSE, CAPILLARY
GLUCOSE-CAPILLARY: 110 mg/dL — AB (ref 65–99)
Glucose-Capillary: 125 mg/dL — ABNORMAL HIGH (ref 65–99)
Glucose-Capillary: 164 mg/dL — ABNORMAL HIGH (ref 65–99)
Glucose-Capillary: 156 mg/dL — ABNORMAL HIGH (ref 65–99)
Glucose-Capillary: 162 mg/dL — ABNORMAL HIGH (ref 65–99)

## 2016-11-05 LAB — CBC
HCT: 19.4 % — ABNORMAL LOW (ref 39.0–52.0)
Hemoglobin: 6.1 g/dL — CL (ref 13.0–17.0)
MCH: 20.3 pg — AB (ref 26.0–34.0)
MCHC: 31.4 g/dL (ref 30.0–36.0)
MCV: 64.7 fL — AB (ref 78.0–100.0)
PLATELETS: 366 10*3/uL (ref 150–400)
RBC: 3 MIL/uL — AB (ref 4.22–5.81)
RDW: 16.8 % — AB (ref 11.5–15.5)
WBC: 23.5 10*3/uL — ABNORMAL HIGH (ref 4.0–10.5)

## 2016-11-05 MED ORDER — PIPERACILLIN-TAZOBACTAM IN DEX 2-0.25 GM/50ML IV SOLN
2.2500 g | Freq: Three times a day (TID) | INTRAVENOUS | Status: DC
  Administered 2016-11-05 – 2016-11-07 (×7): 2.25 g via INTRAVENOUS
  Filled 2016-11-05 (×8): qty 50

## 2016-11-05 MED ORDER — INSULIN ASPART 100 UNIT/ML ~~LOC~~ SOLN
0.0000 [IU] | SUBCUTANEOUS | Status: DC
  Administered 2016-11-05 – 2016-11-06 (×4): 2 [IU] via SUBCUTANEOUS
  Administered 2016-11-06: 1 [IU] via SUBCUTANEOUS
  Administered 2016-11-07 – 2016-11-08 (×5): 2 [IU] via SUBCUTANEOUS
  Administered 2016-11-08 – 2016-11-09 (×4): 1 [IU] via SUBCUTANEOUS
  Administered 2016-11-10 (×2): 2 [IU] via SUBCUTANEOUS
  Administered 2016-11-11 (×2): 1 [IU] via SUBCUTANEOUS
  Administered 2016-11-11: 2 [IU] via SUBCUTANEOUS

## 2016-11-05 MED ORDER — VANCOMYCIN HCL IN DEXTROSE 1-5 GM/200ML-% IV SOLN: 1000.0000 mg | INTRAVENOUS | Status: DC

## 2016-11-05 NOTE — ED Notes (Signed)
[name] [phone]

## 2016-11-05 NOTE — Progress Notes (Signed)
Progress Note    [name]  JWJ:[id number] DOB: 02-27-1951  DOA: 11/04/2016 PCP: Nolene Ebbs, MD    Brief Narrative:   Chief complaint: Follow-up abdominal pain  Medical records reviewed and are as summarized below:  [name] is an 66 y.o. male with a PMH of sickle cell disease, history of ongoing active cocaine abuse, diabetes, hypertension, stage IV-V CKD was admitted 11/04/16 with a chief complaint of severe diffuse abdominal pain and distention. CT scan of the abdomen/pelvis was negative for acute process. WBC noted to be 21.5. Hemoglobin 6.7. Febrile to 102.7.  Assessment/Plan:   Principal Problem:   Sepsis ([place]) Source not entirely clear. CT of abdomen and pelvis was negative for acute findings. No evidence of pneumonia on chest x-ray. Blood cultures sent. Continue empiric Zosyn, but stop vancomycin as intra-abdominal processes not likely due to MRSA. Lactic acid is not elevated. Abdominal sickle cell crisis can mimic sepsis, so would discontinue antibiotics if culture data negative.  Active Problems:   Sickle cell anemia (HCC)/possible abdominal crisis/abdominal pain Hemoglobin at baseline values. Continue oxycodone and Neurontin, with Dilaudid for breakthrough pain (patient says the Dilaudid lasts for about 6 hours). Discussed case with Dr. Zigmund Daniel who recommends ruling out right upper quadrant syndrome (no jaundice or LFT elevation) and indicates that patient could have a sickle cell crisis affecting his abdomen. Continue Folvite.    Hypertension Continue Norvasc and Coreg.    Diabetes mellitus ([place]) Managed with Glucotrol and Onglyza at home, currently on hold. Currently being managed with Lantus 10 units daily and insulin sensitive SSI every 4 hours. Monitor closely for good glycemic control.    Chronic kidney disease (CKD), stage IV (severe) (HCC) status secondary hyperparathyroidism Planning to start dialysis soon. Continue PhosLo and Fosrenol.  Family  Communication/Anticipated D/C date and plan/Code Status   DVT prophylaxis: Lovenox ordered. Code Status: Full Code.  Family Communication: Wife updated at bedside. Disposition Plan: Home when pain improved and sepsis ruled out.   Medical Consultants:    None.   Procedures:    None  Anti-Infectives:    Vancomycin 11/04/16---> 11/05/16  Zosyn 11/04/16--->  Subjective:   [name] continues to have abdominal pain and back pain, improved with administration of IV Dilaudid which lasts 4-6 hours. Denies shortness of breath. No nausea or vomiting. Wants to eat.  Objective:    Vitals:   11/05/16 0015 11/05/16 0030 11/05/16 0055 11/05/16 0403  BP: (!) 141/71 (!) 154/77 (!) 172/86 (!) 150/66  Pulse: 74 73 82 82  Resp: 16 17 18 16   Temp:   100.2 F (37.9 C) 99.9 F (37.7 C)  TempSrc:      SpO2: 99% 99% 94% 90%  Weight:   83.7 kg (184 lb 9.6 oz)   Height:   6\' 1"  (1.854 m)     Intake/Output Summary (Last 24 hours) at 11/05/16 0911 Last data filed at 11/05/16 0600  Gross per 24 hour  Intake           478.75 ml  Output              375 ml  Net           103.75 ml   Filed Weights   11/04/16 1859 11/05/16 0055  Weight: 83.9 kg (185 lb) 83.7 kg (184 lb 9.6 oz)    Exam: General exam: Calm, mildly uncomfortable appearing secondary to pain.  Respiratory system: Slightly diminished in the bases, otherwise clear with normal  respiratory effort. Cardiovascular system: Regular rate, and rhythm. No murmurs, rubs, or gallops appreciated. Gastrointestinal system: Obese abdomen. Otherwise nondistended, diffusely tender, normal active bowel sounds. No hepatomegaly appreciated. Minimal rebound, no guarding. Central nervous system: Alert and oriented, no focal neurological deficits.  Extremities: No clubbing, edema, or cyanosis. Skin: Warm and dry, no rashes, lesions, or ulcers. Psychiatry: Minimally impaired judgment and insight. Mood and affect appropriate.   Data Reviewed:   I  have personally reviewed following labs and imaging studies:  Labs: Basic Metabolic Panel:  Recent Labs Lab 11/04/16 1920 11/05/16 0451  NA 131* 132*  K 4.5 4.5  CL 101 102  CO2 20* 23  GLUCOSE 174* 167*  BUN 50* 51*  CREATININE 4.63* 4.53*  CALCIUM 8.9 8.7*   GFR Estimated Creatinine Clearance: 17.6 mL/min (A) (by C-G formula based on SCr of 4.53 mg/dL (H)). Liver Function Tests:  Recent Labs Lab 11/04/16 1920  AST 27  ALT 14*  ALKPHOS 70  BILITOT 1.3*  PROT 7.5  ALBUMIN 3.2*    Recent Labs Lab 11/04/16 1920  LIPASE 31   CBC:  Recent Labs Lab 11/04/16 1920 11/05/16 0451  WBC 21.5* 23.5*  HGB 6.7* 6.1*  HCT 20.7* 19.4*  MCV 64.7* 64.7*  PLT 398 366   CBG:  Recent Labs Lab 11/05/16 0051 11/05/16 0800  GLUCAP 110* 125*   Sepsis Labs:  Recent Labs Lab 11/04/16 1920 11/04/16 1947 11/04/16 2311 11/05/16 0451  WBC 21.5*  --   --  23.5*  LATICACIDVEN  --  1.48 1.02  --     Microbiology No results found for this or any previous visit (from the past 240 hour(s)).  Radiology: Ct Abdomen Pelvis Wo Contrast  Result Date: 11/04/2016 CLINICAL DATA:  Mid abdominal pain, distention EXAM: CT ABDOMEN AND PELVIS WITHOUT CONTRAST TECHNIQUE: Multidetector CT imaging of the abdomen and pelvis was performed following the standard protocol without IV contrast. COMPARISON:  CT 09/03/2003 FINDINGS: Lower chest: Small bilateral pleural effusions with dependent bibasilar opacities, likely atelectasis. Heart is normal size. Hepatobiliary: No focal liver abnormality is seen. Status post cholecystectomy. No biliary dilatation. Pancreas: No focal abnormality or ductal dilatation. Spleen: Spleen is shrunken and diffusely calcified Adrenals/Urinary Tract: No renal or ureteral stones. No hydronephrosis. No focal renal abnormality. Adrenal glands and urinary bladder unremarkable. Stomach/Bowel: Normal appendix. Stomach, large and small bowel grossly unremarkable.  Vascular/Lymphatic: No evidence of aneurysm or adenopathy. Reproductive: No visible focal abnormality. Other: No free fluid or free air. Musculoskeletal: Diffuse mottled appearance of the bony structures, likely related to sickle cell disease. This is unchanged since prior study. IMPRESSION: No acute findings in the abdomen or pelvis. No evidence of bowel obstruction. Prior cholecystectomy. Shrunken, calcified spleen. Diffuse sclerosis and mottled appearance of the bones. Findings likely related to patient's sickle cell disease. Electronically Signed   By: Rolm Baptise M.D.   On: 11/04/2016 22:52   Dg Chest 2 View  Result Date: 11/04/2016 CLINICAL DATA:  Low back pain with weakness EXAM: CHEST  2 VIEW COMPARISON:  10/28/2016, 08/20/2016 FINDINGS: No focal consolidation or pleural effusion. Stable cardiomediastinal silhouette. No pneumothorax. Heterogenous, patchy sclerosis within both humeral heads, unchanged. IMPRESSION: No active cardiopulmonary disease. Electronically Signed   By: Donavan Foil M.D.   On: 11/04/2016 20:07    Medications:   . amLODipine  10 mg Oral Daily  . calcium acetate  667 mg Oral TID WC  . carvedilol  12.5 mg Oral BID WC  . enoxaparin (LOVENOX) injection  30 mg Subcutaneous Q24H  . folic acid  1 mg Oral Daily  . gabapentin  300 mg Oral TID  . insulin aspart  0-9 Units Subcutaneous Q4H  . insulin glargine  10 Units Subcutaneous QHS  . pantoprazole  40 mg Oral Daily   Continuous Infusions: . sodium chloride 75 mL/hr at 11/05/16 0225  . piperacillin-tazobactam (ZOSYN)  IV Stopped (11/05/16 0540)  . [START ON 11/06/2016] vancomycin      Medical decision making is of high complexity and this patient is at high risk of deterioration, therefore this is a level 3 visit.  (> 4 problem points, >4 data points, high risk: Need 2 out of 3)   Problems/DDx Points   Self limited or minor (max 2)       1   Established problem, stable (HTN, DM, CKD)       1  3  Established problem,  worsening       2   New problem, no additional W/U planned (max 1)       3  3  New problem, additional W/U planned        4    Data Reviewed Points   Review/order clinical lab tests       1  1  Review/order x-rays       1   Review/order tests (Echo, EKG, PFTs, etc)       1   Discussion of test results w/ performing MD       1   Independent review of image, tracing or specimen       2  2  Decision to obtain old records       1   Review and summation of old records       2  2   Level of risk Presenting prob Diagnostics Management   Minimal 1 self limited/minor Labs CXR EKG/EEG U/A U/S Rest Gargles Bandages Dressings   Low 2 or more self limited/minor 1 stable chronic Acute uncomplicated illness Tests (PFTS) Non-CV imaging Arterial labs Biopsies of skin OTC drugs Minor surgery-no risk PT OT IVF without additives    Moderate 1 or more chronic illnesses w/ mild exac, progression or S/E from tx 2 or more stable chronic illnesses Undiagnosed new problem w/ uncertain prognosis Acute complicated injury  Stress tests Endoscopies with no risk factors Deep needle or incisional bx CV imaging without risk LP Thoracentesis Paracentesis Minor surgery w/ risks Elective major surgery w/ no risk (open, percutaneous or endoscopic) Prescription drugs Therapeutic nucl med IVF with additives Closed tx of fracture/dislocation    High Severe exac of chronic illness Acute or chronic illness/injury may pose a threat to life or bodily function (ARF) Change in neuro status    CV imaging w/ contrast and risk Cardio electophysiologic tests Endoscopies w/ risk Discography Elective major surgery Emergency major surgery Parenteral controlled substances Drug therapy req monitoring for toxicity DNR/de-escalation of care    MDM Prob points Data points Risk   Straightforward    <1    <1    Min   Low complexity    2    2    Low   Moderate    3    3    Mod   High Complexity    4 or more     4 or more    High      LOS: 1 day   Chaeli Judy  [facility] Pager [phone]. If unable to reach  me by pager, please call my cell phone at [phone].  *Please refer to [email or web address], password [email or web address] to get updated schedule on who will round on this patient, as hospitalists switch teams weekly. If 7PM-7AM, please contact night-coverage at [email or web address], password [email or web address] for any overnight needs.  11/05/2016, 9:11 AM

## 2016-11-05 NOTE — Progress Notes (Signed)
Pharmacy Antibiotic Note  [name] is a 66 y.o. male admitted on 11/04/2016 with sepsis (intra-abd infection).  Pharmacy has been consulted for Vancomycin and Zosyn dosing.  Vancomycin 1500mg  IV and Zosyn 3.375gm IV given in ED ~2300  Noted pt with CKD stage 4-5; was preparing for HD in near future  Plan: Zosyn 2.25gm IV q8h - doses over 4 hours Vancomycin 1gm IV q48h Will f/u micro data, renal function, and pt's clinical condition Vanc trough prn   Height: 6\' 1"  (185.4 cm) Weight: 184 lb 9.6 oz (83.7 kg) IBW/kg (Calculated) : 79.9  Temp (24hrs), Avg:101 F (38.3 C), Min:99.7 F (37.6 C), Max:102.7 F (39.3 C)   Recent Labs Lab 11/04/16 1920 11/04/16 1947 11/04/16 2311  WBC 21.5*  --   --   CREATININE 4.63*  --   --   LATICACIDVEN  --  1.48 1.02    Estimated Creatinine Clearance: 17.3 mL/min (A) (by C-G formula based on SCr of 4.63 mg/dL (H)).    Allergies  Allergen Reactions  . Morphine And Related Itching    Antimicrobials this admission: 6/2 Vanc >>  6/2 Zosyn >>   Dose adjustments this admission: n/a  Microbiology results: 6/2 BCx x2:   Thank you for allowing pharmacy to be a part of this patient's care.  Sherlon Handing, PharmD, BCPS Clinical pharmacist, pager [phone] 11/05/2016 2:04 AM

## 2016-11-06 ENCOUNTER — Inpatient Hospital Stay (HOSPITAL_COMMUNITY): Payer: Medicare HMO

## 2016-11-06 DIAGNOSIS — R1084 Generalized abdominal pain: Secondary | ICD-10-CM

## 2016-11-06 DIAGNOSIS — D571 Sickle-cell disease without crisis: Secondary | ICD-10-CM

## 2016-11-06 DIAGNOSIS — K5900 Constipation, unspecified: Secondary | ICD-10-CM

## 2016-11-06 DIAGNOSIS — R140 Abdominal distension (gaseous): Secondary | ICD-10-CM

## 2016-11-06 DIAGNOSIS — N184 Chronic kidney disease, stage 4 (severe): Secondary | ICD-10-CM

## 2016-11-06 DIAGNOSIS — E1122 Type 2 diabetes mellitus with diabetic chronic kidney disease: Secondary | ICD-10-CM

## 2016-11-06 LAB — HEPATIC FUNCTION PANEL
ALBUMIN: 3 g/dL — AB (ref 3.5–5.0)
ALK PHOS: 57 U/L (ref 38–126)
ALT: 15 U/L — AB (ref 17–63)
AST: 23 U/L (ref 15–41)
BILIRUBIN TOTAL: 1.8 mg/dL — AB (ref 0.3–1.2)
Bilirubin, Direct: 0.4 mg/dL (ref 0.1–0.5)
Indirect Bilirubin: 1.4 mg/dL — ABNORMAL HIGH (ref 0.3–0.9)
Total Protein: 7.2 g/dL (ref 6.5–8.1)

## 2016-11-06 LAB — GLUCOSE, CAPILLARY
GLUCOSE-CAPILLARY: 159 mg/dL — AB (ref 65–99)
Glucose-Capillary: 117 mg/dL — ABNORMAL HIGH (ref 65–99)
Glucose-Capillary: 112 mg/dL — ABNORMAL HIGH (ref 65–99)
Glucose-Capillary: 137 mg/dL — ABNORMAL HIGH (ref 65–99)
Glucose-Capillary: 137 mg/dL — ABNORMAL HIGH (ref 65–99)

## 2016-11-06 LAB — RENAL FUNCTION PANEL
Albumin: 3.2 g/dL — ABNORMAL LOW (ref 3.5–5.0)
Anion gap: 14 (ref 5–15)
BUN: 52 mg/dL — ABNORMAL HIGH (ref 6–20)
CHLORIDE: 103 mmol/L (ref 101–111)
CO2: 18 mmol/L — AB (ref 22–32)
CREATININE: 4.99 mg/dL — AB (ref 0.61–1.24)
Calcium: 8.9 mg/dL (ref 8.9–10.3)
GFR calc Af Amer: 12 mL/min — ABNORMAL LOW (ref >60)
GFR calc non Af Amer: 11 mL/min — ABNORMAL LOW (ref >60)
Glucose, Bld: 141 mg/dL — ABNORMAL HIGH (ref 65–99)
Phosphorus: 4.3 mg/dL (ref 2.5–4.6)
Potassium: 5.5 mmol/L — ABNORMAL HIGH (ref 3.5–5.1)
Sodium: 135 mmol/L (ref 135–145)

## 2016-11-06 LAB — RETICULOCYTES
RBC.: 2.59 MIL/uL — AB (ref 4.22–5.81)
RETIC COUNT ABSOLUTE: 209.8 10*3/uL — AB (ref 19.0–186.0)
Retic Ct Pct: 8.1 % — ABNORMAL HIGH (ref 0.4–3.1)

## 2016-11-06 MED ORDER — BISACODYL 10 MG RE SUPP
10.0000 mg | Freq: Every day | RECTAL | Status: DC
  Administered 2016-11-06 – 2016-11-09 (×4): 10 mg via RECTAL
  Filled 2016-11-06 (×4): qty 1

## 2016-11-06 NOTE — Progress Notes (Signed)
SICKLE CELL SERVICE PROGRESS NOTE  [name] JEH:[id number] DOB: 08-11-1950 DOA: 11/04/2016 PCP: Nolene Ebbs, MD  Assessment/Plan: Principal Problem:   Sepsis ([name]) Active Problems:   Sickle cell anemia ([name])   Diabetes mellitus ([name])   Hb-SS disease without crisis ([name])   Chronic kidney disease (CKD), stage IV (severe) (HCC)   Abdominal pain  1. Abdominal Distention: I suspect that he has a viral illness causing a partial ileus. I will obtain a KUB to further evaluate. Would change diet to full liquids for now and treat constipation with Dulcolax suppository. Could also have a component of gastroparesis associated with DM II. Will evaluate further after KUB reviewed.  2. Constipation: Dulcolax Suppository. 3. Fever and Leukocytosis: Pt does not appear toxic. However the history is consistent with a viral illness. Will obtain CBC with differential and also obtain CMV and EBV.  4. Hb SS without Crisis: Pt is not having a crisis and this appears to have little role in his current acute illness.  5. Dm II: Pt is on Onglyza at home. I agree with holding the Onglyza and continuing with Lantus and Novolog.  6. CKD IV: BUN increased from January which may indicate a pre-renal state, but Cr unchanged. Will check with Nephrologist for most recent lab data. Continue to hold Lasix.   Code Status: Full Code Family Communication: N/A Disposition Plan: Not yet ready for discharge  [place].  Pager [phone]. If 7PM-7AM, please contact night-coverage.  11/06/2016, 10:25 AM  LOS: 2 days   Interim History: [name] is a 66 y/o gentleman with HB SS who presented to the hospital 2 days ago with c/o abdominal pain and fever. He also c/o abdominal distension and constipation. Pt reports that his abdomen is much more swollen than usual and that he has not had a BM in 5 days. He is dependent on laxatives for having a BM and has a BM about 2 x week. He has had a mild non-productive cough which  has been ongoing but to his assessment pretty unremarkable. He has been having some hiccups and is able to tolerate apple sauce and mashed potatoes but not much else. He also feels that he is having some difficulty breathing. Since being hospitalized he was started on Vancomycin and Zosyn but Vancomycin was discontinued. Thus he is on Day #3 of Zosyn. He is still having fevers as high as 102 despite antibiotics. His co-morbidities include CKD IV, Diabetes, HTN, Hb SS and GERD. His WBC count is elevated but no differential available. CMET on admission  reflects CKD V and LFT's were essentially normal. He denies any diarrhea and is passing flatus.   Today he c/o sever abdominal pain and appears to be visibly in pain. He is passing flatus but has not had a BM in 5 days. He also reports difficulty taking a deep breath stating that he feels that he cannot get air into his lungs.   Consultants:  None  Procedures:  None  Antibiotics:  Vancomycin 6/2 >>6/3  Zosyn 6/2 >>  CXR and CT abdomen and pelvis reviewed.  Objective: Vitals:   11/05/16 1718 11/05/16 2048 11/06/16 0520 11/06/16 0833  BP: 132/62 (!) 143/60 (!) 141/65 (!) 146/66  Pulse: 73 83 91 93  Resp: 16 16 18  (!) 22  Temp: (!) 100.7 F (38.2 C) (!) 101.2 F (38.4 C) (!) 101 F (38.3 C) (!) 101 F (38.3 C)  TempSrc: Oral Oral Oral Oral  SpO2: 92% 93% 94% 100%  Weight:  86.5 kg (190 lb 9.6 oz)    Height:       Weight change: 2.54 kg (5 lb 9.6 oz)  Intake/Output Summary (Last 24 hours) at 11/06/16 1025 Last data filed at 11/06/16 2585  Gross per 24 hour  Intake             2305 ml  Output             1200 ml  Net             1105 ml     Physical Exam General: Alert, awake, oriented x3, in moderate distress due to abdominal pain.  HEENT: [place]/AT PEERL, EOMI, anicteric Neck: Trachea midline,  no masses, no thyromegal,y no JVD, no carotid bruit OROPHARYNX:  Moist, No exudate/ erythema/lesions.  Heart: Regular rate and  rhythm.  Lungs: Clear to auscultation, no wheezing or rhonchi noted. No increased vocal fremitus resonant to percussion  Abdomen: Markedly distended with hyperactive bowel sounds. Unable to palpate for masses due to severe distension.  Neuro: No focal neurological deficits noted cranial nerves II through XII grossly intact. .Strength at baseline in bilateral upper and lower extremities. Musculoskeletal: No warmth swelling or erythema around joints, no spinal tenderness noted. Psychiatric: Patient alert and oriented x3, good insight and cognition, good recent to remote recall.   Data Reviewed: Basic Metabolic Panel:  Recent Labs Lab 11/04/16 1920 11/05/16 0451  NA 131* 132*  K 4.5 4.5  CL 101 102  CO2 20* 23  GLUCOSE 174* 167*  BUN 50* 51*  CREATININE 4.63* 4.53*  CALCIUM 8.9 8.7*   Liver Function Tests:  Recent Labs Lab 11/04/16 1920  AST 27  ALT 14*  ALKPHOS 70  BILITOT 1.3*  PROT 7.5  ALBUMIN 3.2*    Recent Labs Lab 11/04/16 1920  LIPASE 31   No results for input(s): AMMONIA in the last 168 hours. CBC:  Recent Labs Lab 11/04/16 1920 11/05/16 0451  WBC 21.5* 23.5*  HGB 6.7* 6.1*  HCT 20.7* 19.4*  MCV 64.7* 64.7*  PLT 398 366   Cardiac Enzymes: No results for input(s): CKTOTAL, CKMB, CKMBINDEX, TROPONINI in the last 168 hours. BNP (last 3 results) No results for input(s): BNP in the last 8760 hours.  ProBNP (last 3 results) No results for input(s): PROBNP in the last 8760 hours.  CBG:  Recent Labs Lab 11/05/16 1222 11/05/16 1721 11/05/16 2050 11/06/16 0550 11/06/16 0731  GLUCAP 164* 156* 162* 117* 112*    No results found for this or any previous visit (from the past 240 hour(s)).   Studies: Ct Abdomen Pelvis Wo Contrast  Result Date: 11/04/2016 CLINICAL DATA:  Mid abdominal pain, distention EXAM: CT ABDOMEN AND PELVIS WITHOUT CONTRAST TECHNIQUE: Multidetector CT imaging of the abdomen and pelvis was performed following the standard  protocol without IV contrast. COMPARISON:  CT 09/03/2003 FINDINGS: Lower chest: Small bilateral pleural effusions with dependent bibasilar opacities, likely atelectasis. Heart is normal size. Hepatobiliary: No focal liver abnormality is seen. Status post cholecystectomy. No biliary dilatation. Pancreas: No focal abnormality or ductal dilatation. Spleen: Spleen is shrunken and diffusely calcified Adrenals/Urinary Tract: No renal or ureteral stones. No hydronephrosis. No focal renal abnormality. Adrenal glands and urinary bladder unremarkable. Stomach/Bowel: Normal appendix. Stomach, large and small bowel grossly unremarkable. Vascular/Lymphatic: No evidence of aneurysm or adenopathy. Reproductive: No visible focal abnormality. Other: No free fluid or free air. Musculoskeletal: Diffuse mottled appearance of the bony structures, likely related to sickle cell disease. This is unchanged  since prior study. IMPRESSION: No acute findings in the abdomen or pelvis. No evidence of bowel obstruction. Prior cholecystectomy. Shrunken, calcified spleen. Diffuse sclerosis and mottled appearance of the bones. Findings likely related to patient's sickle cell disease. Electronically Signed   By: Rolm Baptise M.D.   On: 11/04/2016 22:52   Dg Chest 2 View  Result Date: 11/04/2016 CLINICAL DATA:  Low back pain with weakness EXAM: CHEST  2 VIEW COMPARISON:  10/28/2016, 08/20/2016 FINDINGS: No focal consolidation or pleural effusion. Stable cardiomediastinal silhouette. No pneumothorax. Heterogenous, patchy sclerosis within both humeral heads, unchanged. IMPRESSION: No active cardiopulmonary disease. Electronically Signed   By: Donavan Foil M.D.   On: 11/04/2016 20:07   Dg Abd Acute W/chest  Result Date: 10/28/2016 CLINICAL DATA:  Generalize weakness for 3 days. The lays, body aches, hypoglycemia. EXAM: DG ABDOMEN ACUTE W/ 1V CHEST COMPARISON:  Chest 08/10/2016 FINDINGS: Heart size and pulmonary vascularity are normal. No focal  airspace disease or consolidation in the lungs. No blunting of costophrenic angles. No pneumothorax. Bone sclerosis involving the shoulders likely representing bone infarcts. Scattered gas and stool throughout the colon. No small or large bowel distention. No free intra-abdominal air. No abnormal air-fluid levels. No radiopaque stones. Surgical clips in the right upper quadrant. Heterogeneous sclerotic bone changes involving the spine, sacrum, pelvis, and hips likely representing sickle cell changes. IMPRESSION: No evidence of active pulmonary disease. Normal nonobstructive bowel gas pattern with stool-filled colon. Diffuse bony changes consistent with sickle cell. Electronically Signed   By: Lucienne Capers M.D.   On: 10/28/2016 01:54    Scheduled Meds: . amLODipine  10 mg Oral Daily  . calcium acetate  667 mg Oral TID WC  . carvedilol  12.5 mg Oral BID WC  . enoxaparin (LOVENOX) injection  30 mg Subcutaneous Q24H  . folic acid  1 mg Oral Daily  . gabapentin  300 mg Oral TID  . insulin aspart  0-9 Units Subcutaneous Q4H  . insulin glargine  10 Units Subcutaneous QHS  . pantoprazole  40 mg Oral Daily   Continuous Infusions: . sodium chloride 75 mL/hr at 11/05/16 0225  . piperacillin-tazobactam (ZOSYN)  IV 2.25 g (11/06/16 0647)    Principal Problem:   Sepsis ([place]) Active Problems:   Sickle cell anemia (HCC)   Diabetes mellitus ([place])   Hb-SS disease without crisis ([place])   Chronic kidney disease (CKD), stage IV (severe) (HCC)   Abdominal pain    In excess of 40 minutes spent during this visit. Greater than 50% involved face to face contact with the patient for assessment, counseling and coordination of care.

## 2016-11-07 DIAGNOSIS — R6510 Systemic inflammatory response syndrome (SIRS) of non-infectious origin without acute organ dysfunction: Secondary | ICD-10-CM

## 2016-11-07 DIAGNOSIS — E875 Hyperkalemia: Secondary | ICD-10-CM

## 2016-11-07 DIAGNOSIS — K567 Ileus, unspecified: Secondary | ICD-10-CM

## 2016-11-07 LAB — CBC WITH DIFFERENTIAL/PLATELET
Basophils Absolute: 0 10*3/uL (ref 0.0–0.1)
Basophils Relative: 0 %
EOS PCT: 2 %
Eosinophils Absolute: 0.4 10*3/uL (ref 0.0–0.7)
HEMATOCRIT: 14.1 % — AB (ref 39.0–52.0)
HEMOGLOBIN: 4.6 g/dL — AB (ref 13.0–17.0)
LYMPHS ABS: 3.3 10*3/uL (ref 0.7–4.0)
Lymphocytes Relative: 15 %
MCH: 20.8 pg — AB (ref 26.0–34.0)
MCHC: 32.6 g/dL (ref 30.0–36.0)
MCV: 63.8 fL — AB (ref 78.0–100.0)
MONO ABS: 4 10*3/uL — AB (ref 0.1–1.0)
MONOS PCT: 18 %
NEUTROS ABS: 14.4 10*3/uL — AB (ref 1.7–7.7)
Neutrophils Relative %: 65 %
Platelets: 312 10*3/uL (ref 150–400)
RBC: 2.21 MIL/uL — ABNORMAL LOW (ref 4.22–5.81)
RDW: 18.7 % — AB (ref 11.5–15.5)
WBC: 22.1 10*3/uL — AB (ref 4.0–10.5)

## 2016-11-07 LAB — CMV IGM: CMV IgM: <30 AU/mL (ref 0.0–29.9)

## 2016-11-07 LAB — EPSTEIN-BARR VIRUS VCA ANTIBODY PANEL
EBV Early Antigen Ab, IgG: <9 U/mL (ref 0.0–8.9)
EBV NA IgG: >600 U/mL — ABNORMAL HIGH (ref 0.0–17.9)
EBV VCA IgG: 171 U/mL — ABNORMAL HIGH (ref 0.0–17.9)

## 2016-11-07 LAB — RENAL FUNCTION PANEL
Albumin: 2.7 g/dL — ABNORMAL LOW (ref 3.5–5.0)
Anion gap: 7 (ref 5–15)
BUN: 58 mg/dL — AB (ref 6–20)
CHLORIDE: 105 mmol/L (ref 101–111)
CO2: 21 mmol/L — AB (ref 22–32)
CREATININE: 5.41 mg/dL — AB (ref 0.61–1.24)
Calcium: 8.8 mg/dL — ABNORMAL LOW (ref 8.9–10.3)
GFR calc Af Amer: 11 mL/min — ABNORMAL LOW (ref >60)
GFR, EST NON AFRICAN AMERICAN: 10 mL/min — AB (ref >60)
GLUCOSE: 161 mg/dL — AB (ref 65–99)
Phosphorus: 3.9 mg/dL (ref 2.5–4.6)
Potassium: 5.2 mmol/L — ABNORMAL HIGH (ref 3.5–5.1)
Sodium: 133 mmol/L — ABNORMAL LOW (ref 135–145)

## 2016-11-07 LAB — RETICULOCYTES
RBC.: 2.2 MIL/uL — ABNORMAL LOW (ref 4.22–5.81)
RETIC CT PCT: 8.3 % — AB (ref 0.4–3.1)
Retic Count, Absolute: 182.6 10*3/uL (ref 19.0–186.0)

## 2016-11-07 LAB — PREPARE RBC (CROSSMATCH)

## 2016-11-07 LAB — GLUCOSE, CAPILLARY
GLUCOSE-CAPILLARY: 104 mg/dL — AB (ref 65–99)
GLUCOSE-CAPILLARY: 180 mg/dL — AB (ref 65–99)
Glucose-Capillary: 159 mg/dL — ABNORMAL HIGH (ref 65–99)
Glucose-Capillary: 179 mg/dL — ABNORMAL HIGH (ref 65–99)

## 2016-11-07 LAB — IRON AND TIBC
IRON: 23 ug/dL — AB (ref 45–182)
Saturation Ratios: 12 % — ABNORMAL LOW (ref 17.9–39.5)
TIBC: 195 ug/dL — ABNORMAL LOW (ref 250–450)
UIBC: 172 ug/dL

## 2016-11-07 LAB — VITAMIN B12: VITAMIN B 12: 198 pg/mL (ref 180–914)

## 2016-11-07 MED ORDER — FOLIC ACID 1 MG PO TABS
2.0000 mg | ORAL_TABLET | Freq: Every day | ORAL | Status: DC
  Administered 2016-11-08 – 2016-11-12 (×5): 2 mg via ORAL
  Filled 2016-11-07 (×5): qty 2

## 2016-11-07 MED ORDER — DARBEPOETIN ALFA 200 MCG/0.4ML IJ SOSY
200.0000 ug | PREFILLED_SYRINGE | INTRAMUSCULAR | Status: DC
  Administered 2016-11-07: 200 ug via SUBCUTANEOUS
  Filled 2016-11-07: qty 0.4

## 2016-11-07 MED ORDER — SODIUM CHLORIDE 0.9 % IV SOLN
Freq: Once | INTRAVENOUS | Status: AC
  Administered 2016-11-07: 15:00:00 via INTRAVENOUS

## 2016-11-07 NOTE — Progress Notes (Addendum)
SICKLE CELL SERVICE PROGRESS NOTE  [name] BSW:[id number] DOB: Oct 28, 1950 DOA: 11/04/2016 PCP: Nolene Ebbs, MD  Assessment/Plan: Principal Problem:   Sepsis ([place]) Active Problems:   Sickle cell anemia ([place])   Diabetes mellitus ([place])   Hb-SS disease without crisis ([place])   Chronic kidney disease (CKD), stage IV (severe) (HCC)   Abdominal pain  1. Abdominal Distension: KUB and clinical source is consistent with resolving partial paralytic ileus. He is improved today. Will continue full liquid diet and repeat Dulcolax suppository today.  2. SIRS: Pt has an inflammatory response likely to viral illness which would explain the fever, elevated WBC and Ileus. His fevers are improving and will review today's labs. CMV negative and EBV pending.  Sepsis resolved. I will also discontinue antibiotics as cultures negative and I clinically there is no obvious source for infection Additionally he has continued to have fevers and elevation of WBC even while on antibiotics.  3. CKD IV with Hyperkalemia: Pt with mild hyperkalemia. Repeat labs pending from today. Still awaiting records from Nephrologist. Lasix held. Will obtain daily weights.  4. HTN: BP well controlled. Continue Amlodipine 5. Hb SS without Crisis: Stable . 6. DM II: Continue Insulin. 7. Anemia of Chronic Disease: Might be beneficial to check Erythropoetin levels. However without his records from Nephrology I am unsure if there has been a work-up for anemia. Furthermore this is not an acute issue at this time.  8. Activity: Encourage ambulation    Code Status: Full Code Family Communication: N/A Disposition Plan: Not yet ready for discharge  [place].  Pager [phone]. If 7PM-7AM, please contact night-coverage.  11/07/2016, 9:48 AM  LOS: 3 days   Interim History: Pt reports that he has been passing much more flatus and also belching. He reports that his abdomen feels much better and he is able to breathe better today. He  has no other c/o pain and otherwise feels well. He appears to be in much less discomfort than yesterday. He had a BM after receiving the suppository. KUB reviewed and labs from yesterday showed mild increase in Potassium.   Consultants:  None  Procedures:  None  Antibiotics:  Zosyn 6/2 >>6/5  Vancomycin 6/2 >>6/3    Objective: Vitals:   11/06/16 2130 11/07/16 0550 11/07/16 0900 11/07/16 0905  BP: (!) 147/70 138/66 (!) 124/58 (!) 124/58  Pulse: 98 89    Resp: 20 18 16    Temp: 98.7 F (37.1 C) 98.3 F (36.8 C) (!) 100.4 F (38 C)   TempSrc: Oral Oral Oral   SpO2: 94% 94% 98%   Weight: 87.2 kg (192 lb 4.8 oz)     Height:       Weight change: 0.771 kg (1 lb 11.2 oz)  Intake/Output Summary (Last 24 hours) at 11/07/16 0948 Last data filed at 11/07/16 0057  Gross per 24 hour  Intake             1129 ml  Output              575 ml  Net              554 ml      Physical Exam  Abdominal: He exhibits distension.   General: Alert, awake, oriented x3, in no acute distress.  HEENT: [place]/AT PEERL, EOMI, anicteric Neck: Trachea midline,  no masses, no thyromegal,y no JVD, no carotid bruit OROPHARYNX:  Moist, No exudate/ erythema/lesions.  Heart: Regular rate and rhythm, without murmurs, rubs, gallops, PMI non-displaced, no  heaves or thrills on palpation.  Lungs: Clear to auscultation, no wheezing or rhonchi noted. No increased vocal fremitus resonant to percussion  Abdomen: Softer today than it was yesterday and with less distension.  Pt has only mild tenderness and with good bowel sounds. Unable to appreciate any masses or hepatosplenomegaly.   Neuro: No focal neurological deficits noted cranial nerves II through XII grossly intact.  Strength at baseline in bilateral upper and lower extremities. Musculoskeletal: No warmth swelling or erythema around joints, no spinal tenderness noted. Psychiatric: Patient alert and oriented x3, good insight and cognition, good recent to remote  recall.    Data Reviewed: Basic Metabolic Panel:  Recent Labs Lab 11/04/16 1920 11/05/16 0451 11/06/16 1229  NA 131* 132* 135  K 4.5 4.5 5.5*  CL 101 102 103  CO2 20* 23 18*  GLUCOSE 174* 167* 141*  BUN 50* 51* 52*  CREATININE 4.63* 4.53* 4.99*  CALCIUM 8.9 8.7* 8.9  PHOS  --   --  4.3   Liver Function Tests:  Recent Labs Lab 11/04/16 1920 11/06/16 1228 11/06/16 1229  AST 27 23  --   ALT 14* 15*  --   ALKPHOS 70 57  --   BILITOT 1.3* 1.8*  --   PROT 7.5 7.2  --   ALBUMIN 3.2* 3.0* 3.2*    Recent Labs Lab 11/04/16 1920  LIPASE 31   No results for input(s): AMMONIA in the last 168 hours. CBC:  Recent Labs Lab 11/04/16 1920 11/05/16 0451 11/06/16 1228  WBC 21.5* 23.5* 24.5*  NEUTROABS  --   --  16.2*  HGB 6.7* 6.1* 5.5*  HCT 20.7* 19.4* 16.7*  MCV 64.7* 64.7* 64.5*  PLT 398 366 149*   Cardiac Enzymes: No results for input(s): CKTOTAL, CKMB, CKMBINDEX, TROPONINI in the last 168 hours. BNP (last 3 results) No results for input(s): BNP in the last 8760 hours.  ProBNP (last 3 results) No results for input(s): PROBNP in the last 8760 hours.  CBG:  Recent Labs Lab 11/06/16 0731 11/06/16 1207 11/06/16 1612 11/06/16 2131 11/07/16 0804  GLUCAP 112* 159* 137* 137* 104*    Recent Results (from the past 240 hour(s))  Blood Culture (routine x 2)     Status: None (Preliminary result)   Collection Time: 11/04/16 10:56 PM  Result Value Ref Range Status   Specimen Description BLOOD RIGHT ANTECUBITAL  Final   Special Requests   Final    BOTTLES DRAWN AEROBIC AND ANAEROBIC Blood Culture adequate volume   Culture NO GROWTH 1 DAY  Final   Report Status PENDING  Incomplete  Blood Culture (routine x 2)     Status: None (Preliminary result)   Collection Time: 11/04/16 10:56 PM  Result Value Ref Range Status   Specimen Description BLOOD LEFT ANTECUBITAL  Final   Special Requests   Final    BOTTLES DRAWN AEROBIC AND ANAEROBIC Blood Culture adequate volume    Culture NO GROWTH 1 DAY  Final   Report Status PENDING  Incomplete     Studies: Ct Abdomen Pelvis Wo Contrast  Result Date: 11/04/2016 CLINICAL DATA:  Mid abdominal pain, distention EXAM: CT ABDOMEN AND PELVIS WITHOUT CONTRAST TECHNIQUE: Multidetector CT imaging of the abdomen and pelvis was performed following the standard protocol without IV contrast. COMPARISON:  CT 09/03/2003 FINDINGS: Lower chest: Small bilateral pleural effusions with dependent bibasilar opacities, likely atelectasis. Heart is normal size. Hepatobiliary: No focal liver abnormality is seen. Status post cholecystectomy. No biliary dilatation. Pancreas: No focal  abnormality or ductal dilatation. Spleen: Spleen is shrunken and diffusely calcified Adrenals/Urinary Tract: No renal or ureteral stones. No hydronephrosis. No focal renal abnormality. Adrenal glands and urinary bladder unremarkable. Stomach/Bowel: Normal appendix. Stomach, large and small bowel grossly unremarkable. Vascular/Lymphatic: No evidence of aneurysm or adenopathy. Reproductive: No visible focal abnormality. Other: No free fluid or free air. Musculoskeletal: Diffuse mottled appearance of the bony structures, likely related to sickle cell disease. This is unchanged since prior study. IMPRESSION: No acute findings in the abdomen or pelvis. No evidence of bowel obstruction. Prior cholecystectomy. Shrunken, calcified spleen. Diffuse sclerosis and mottled appearance of the bones. Findings likely related to patient's sickle cell disease. Electronically Signed   By: Rolm Baptise M.D.   On: 11/04/2016 22:52   Dg Chest 2 View  Result Date: 11/04/2016 CLINICAL DATA:  Low back pain with weakness EXAM: CHEST  2 VIEW COMPARISON:  10/28/2016, 08/20/2016 FINDINGS: No focal consolidation or pleural effusion. Stable cardiomediastinal silhouette. No pneumothorax. Heterogenous, patchy sclerosis within both humeral heads, unchanged. IMPRESSION: No active cardiopulmonary disease.  Electronically Signed   By: Donavan Foil M.D.   On: 11/04/2016 20:07   Dg Abd 1 View  Result Date: 11/06/2016 CLINICAL DATA:  Constipation, abdominal distension. EXAM: ABDOMEN - 1 VIEW COMPARISON:  Abdominopelvic CT scan of November 04, 2016 FINDINGS: The colonic stool burden does not appear increased. There is a moderate amount of gas within normal caliber colon and rectum. No small bowel gas or fluid collections are observed. There surgical clips in the gallbladder fossa. There degenerative changes of the lumbar spine. There is scleroses of the bony structures compatible with known sickle cell disease. IMPRESSION: No evidence of excessive colonic stool. No bowel obstruction or ileus. Electronically Signed   By: David  [place]ique M.D.   On: 11/06/2016 11:16   Dg Abd Acute W/chest  Result Date: 10/28/2016 CLINICAL DATA:  Generalize weakness for 3 days. The lays, body aches, hypoglycemia. EXAM: DG ABDOMEN ACUTE W/ 1V CHEST COMPARISON:  Chest 08/10/2016 FINDINGS: Heart size and pulmonary vascularity are normal. No focal airspace disease or consolidation in the lungs. No blunting of costophrenic angles. No pneumothorax. Bone sclerosis involving the shoulders likely representing bone infarcts. Scattered gas and stool throughout the colon. No small or large bowel distention. No free intra-abdominal air. No abnormal air-fluid levels. No radiopaque stones. Surgical clips in the right upper quadrant. Heterogeneous sclerotic bone changes involving the spine, sacrum, pelvis, and hips likely representing sickle cell changes. IMPRESSION: No evidence of active pulmonary disease. Normal nonobstructive bowel gas pattern with stool-filled colon. Diffuse bony changes consistent with sickle cell. Electronically Signed   By: Lucienne Capers M.D.   On: 10/28/2016 01:54    Scheduled Meds: . amLODipine  10 mg Oral Daily  . bisacodyl  10 mg Rectal Daily  . calcium acetate  667 mg Oral TID WC  . carvedilol  12.5 mg Oral BID WC  .  enoxaparin (LOVENOX) injection  30 mg Subcutaneous Q24H  . folic acid  1 mg Oral Daily  . gabapentin  300 mg Oral TID  . insulin aspart  0-9 Units Subcutaneous Q4H  . insulin glargine  10 Units Subcutaneous QHS  . pantoprazole  40 mg Oral Daily   Continuous Infusions: . sodium chloride 75 mL/hr at 11/06/16 1206    Principal Problem:   Sepsis ([place]) Active Problems:   Sickle cell anemia (HCC)   Diabetes mellitus ([place])   Hb-SS disease without crisis ([place])   Chronic kidney disease (CKD), stage  IV (severe) (HCC)   Abdominal pain   In excess of 35 minutes spent during this visit. Greater than 50% involved face to face contact with the patient for assessment, counseling and coordination of care.

## 2016-11-07 NOTE — Progress Notes (Signed)
Patient ID: [name], male   DOB: [birth year], 66 y.o.   MRN: [id number] Reviewed labs. Hb down to 4.6 g/dL and Potassium still mildly elevated. [place] records from Nephrologist and BUN 47 with Cr 3.92 in April.   PLAN: 1. Transfuse 1 unit RBC's.  2. Obtain reticulocyte count, B-12,, folate, Iron studies and Erythropoietin levels. 3. Will ask Nephrology to see in consult.   MATTHEWS,MICHELLE A.

## 2016-11-07 NOTE — Consult Note (Signed)
[facility] Consultation Note Requesting Physician:  Dr. Zigmund Daniel Primary Nephrologist: Dr. Marval Regal Reason for Consult:  Worsening Stage 5 CKD, severe anemia  HPI: The patient is a 66 y.o. year-old with DM, HTN, CKD5, sickle cell. Followed by Dr. Marval Regal in the office, last seen 11/02/16, no labs done (because pt had just been to the ED of 5/25 for weakness, malaise, low BS's w/creatinine at that time 4.72). Last labs at [facility] were from 09/18/16 and at that time was 3.92 so has had progressive ds.   He has been seen in the past by [facility] (07/2016 Dr. Trula Slade), not a candidate for AVF, but to get AVG since getting closer to HD (just referred back last week). Dr. Stephens Shire note from 2/18 states can be scheduled without bringing him back to the office. Plan was to go ahead w/AVG but pt says "I've been here since I saw Dr. Loletha Grayer"  He presented on the current occasion with diffuse abdominal pain and distension, fevers to 102. WBC 24,500. CTA/P neg. ATB's started for possible sepsis. KUB c/w ileus. No obvious source for infection so ATB's stopped. WBC still >20,000.  Hb today down to 4.6 and pt to be transfused. We are asked to see because of creatinine of 5.41. Minimal difference in GFR (10 vs 12)  Pt reports has been having chills and sweats, soaking his gown. Appetite OK but abdomen hurts so not inclined to eat. Not really nauseous.  Bowels not moving very well. Abdomen has remained distended. Says feels weak and cold.   Creatinine, Ser  Date/Time Value Ref Range Status  11/07/2016 10:52 AM 5.41 (H) 0.61 - 1.24 mg/dL Final  11/06/2016 12:29 PM 4.99 (H) 0.61 - 1.24 mg/dL Final  11/05/2016 04:51 AM 4.53 (H) 0.61 - 1.24 mg/dL Final  11/04/2016 07:20 PM 4.63 (H) 0.61 - 1.24 mg/dL Final  10/27/2016 08:52 PM 4.72 (H) 0.61 - 1.24 mg/dL Final  06/06/2016 08:40 AM 4.12 (H) 0.61 - 1.24 mg/dL Final  06/05/2016 06:58 AM 4.17 (H) 0.61 - 1.24 mg/dL Final  06/04/2016 02:08 AM 3.79 (H) 0.61 - 1.24 mg/dL Final  06/04/2016  02:08 AM 3.82 (H) 0.61 - 1.24 mg/dL Final  06/03/2016 09:24 AM 3.68 (H) 0.61 - 1.24 mg/dL Final  06/02/2016 06:14 PM 3.93 (H) 0.61 - 1.24 mg/dL Final  03/27/2016 12:17 PM 3.40 (H) 0.61 - 1.24 mg/dL Final  03/26/2016 11:45 AM 3.19 (H) 0.61 - 1.24 mg/dL Final  03/25/2016 05:35 AM 3.70 (H) 0.61 - 1.24 mg/dL Final    Past Medical History:  Diagnosis Date  . Diabetes mellitus   . Hypertension   . Renal insufficiency   . Sickle cell anemia (HCC)    Hgb SS disease      Past Surgical History:  Procedure Laterality Date  . CHOLECYSTECTOMY    . COLON SURGERY     Colon cancer surgery, removed small amt not full colectomy  . COLONOSCOPY     nclear who follows him for this  . LEG SURGERY     For ? gangrene after running into barbwire fence at 66yo     Family History  Problem Relation Age of Onset  . Venous thrombosis Mother        Dec. of blood clot   . Diabetes type II Mother   . AAA (abdominal aortic aneurysm) Mother   . Throat cancer Father        Deceased of throat cancer   Social History:  reports that he quit smoking about 50 years ago.  He has a 10.00 pack-year smoking history. He has never used smokeless tobacco. He reports that he drinks alcohol. He reports that he uses drugs, including Cocaine.   Allergies  Allergen Reactions  . Morphine And Related Itching     Prior to Admission medications   Medication Sig Start Date End Date Taking? Authorizing Provider  acetaminophen (TYLENOL) 500 MG tablet Take 500 mg by mouth every 6 (six) hours as needed for headache (pain).   Yes [provider]  amLODipine (NORVASC) 10 MG tablet Take 1 tablet (10 mg total) by mouth daily. 06/07/16  Yes Leana Gamer, MD  calcium acetate (PHOSLO) 667 MG tablet Take 667 mg by mouth 3 (three) times daily. 09/26/16  Yes [provider]  carvedilol (COREG) 12.5 MG tablet Take 12.5 mg by mouth 2 (two) times daily. 09/18/16  Yes [provider]  Docusate Calcium (STOOL  SOFTENER PO) Take 1 capsule by mouth daily as needed (constipation).   Yes [provider]  folic acid (FOLVITE) 1 MG tablet Take 1 tablet (1 mg total) by mouth daily. 04/18/13  Yes Robbie Lis, MD  furosemide (LASIX) 40 MG tablet Take 1 tablet (40 mg total) by mouth daily. 06/07/16  Yes Leana Gamer, MD  gabapentin (NEURONTIN) 300 MG capsule Take 300 mg by mouth 3 (three) times daily.   Yes [provider]  glipiZIDE (GLUCOTROL XL) 10 MG 24 hr tablet Take 10 mg by mouth daily with breakfast.    Yes [provider]  insulin aspart (NOVOLOG FLEXPEN) 100 UNIT/ML FlexPen Inject 2-6 Units into the skin 3 (three) times daily as needed for high blood sugar (CBG >150).   Yes [provider]  insulin glargine (LANTUS) 100 unit/mL SOPN Inject 20 Units into the skin at bedtime.   Yes [provider]  omeprazole (PRILOSEC) 20 MG capsule Take 20 mg by mouth daily.    Yes [provider]  oxyCODONE-acetaminophen (PERCOCET/ROXICET) 5-325 MG tablet Take 1 tablet by mouth every 4 (four) hours as needed for moderate pain. 06/06/16  Yes Leana Gamer, MD  polyethylene glycol powder (MIRALAX) powder Take 17 g by mouth daily. Patient taking differently: Take 17 g by mouth daily as needed (constipation). Mix in 8 oz liquid and drink 10/28/16  Yes Palumbo, April, MD  saxagliptin HCl (ONGLYZA) 5 MG TABS tablet Take 5 mg by mouth daily.   Yes [provider]  Sennosides (SENNA LAXATIVE PO) Take 1 tablet by mouth daily as needed (constipation).   Yes [provider]    Inpatient medications: . amLODipine  10 mg Oral Daily  . bisacodyl  10 mg Rectal Daily  . calcium acetate  667 mg Oral TID WC  . carvedilol  12.5 mg Oral BID WC  . enoxaparin (LOVENOX) injection  30 mg Subcutaneous Q24H  . [START ON 0/01/6577] folic acid  2 mg Oral Daily  . gabapentin  300 mg Oral TID  . insulin aspart  0-9 Units Subcutaneous Q4H  . insulin glargine  10  Units Subcutaneous QHS  . pantoprazole  40 mg Oral Daily    Review of Systems See HPI    Physical Exam:  Blood pressure (!) 124/58, pulse 89, temperature (!) 100.4 F (38 C), temperature source Oral, resp. rate 16, height 6\' 1"  (1.854 m), weight 87.2 kg (192 lb 4.8 oz), SpO2 98 %.  Gen: Very nice chronically ill appearing AAM Lines/tubes: Single PIV infiltrated Skin: no rash, cyanosis No JVD Chest: clear  without crackles or wheezes Regular S1S2 No S3 Abdomen protuberant. Quiet. Mild diffuse tenderness 1+ edema  Both LE's No asterixus   Recent Labs Lab 11/04/16 1920 11/05/16 0451 11/06/16 1229 11/07/16 1052  NA 131* 132* 135 133*  K 4.5 4.5 5.5* 5.2*  CL 101 102 103 105  CO2 20* 23 18* 21*  GLUCOSE 174* 167* 141* 161*  BUN 50* 51* 52* 58*  CREATININE 4.63* 4.53* 4.99* 5.41*  CALCIUM 8.9 8.7* 8.9 8.8*  PHOS  --   --  4.3 3.9     Recent Labs Lab 11/04/16 1920 11/06/16 1228 11/06/16 1229 11/07/16 1052  AST 27 23  --   --   ALT 14* 15*  --   --   ALKPHOS 70 57  --   --   BILITOT 1.3* 1.8*  --   --   PROT 7.5 7.2  --   --   ALBUMIN 3.2* 3.0* 3.2* 2.7*    Recent Labs Lab 11/04/16 1920  LIPASE 31    Recent Labs Lab 11/04/16 1920 11/05/16 0451 11/06/16 1228 11/07/16 1052  WBC 21.5* 23.5* 24.5* 22.1*  NEUTROABS  --   --  16.2* 14.4*  HGB 6.7* 6.1* 5.5* 4.6*  HCT 20.7* 19.4* 16.7* 14.1*  MCV 64.7* 64.7* 64.5* 63.8*  PLT 398 366 149* 312     Recent Labs Lab 11/06/16 1612 11/06/16 2131 11/07/16 0804 11/07/16 1144 11/07/16 1631  GLUCAP 137* 137* 104* 159* 179*    Xrays/Other Studies: Dg Abd 1 View  Result Date: 11/06/2016 CLINICAL DATA:  Constipation, abdominal distension. EXAM: ABDOMEN - 1 VIEW COMPARISON:  Abdominopelvic CT scan of November 04, 2016 FINDINGS: The colonic stool burden does not appear increased. There is a moderate amount of gas within normal caliber colon and rectum. No small bowel gas or fluid collections are observed. There  surgical clips in the gallbladder fossa. There degenerative changes of the lumbar spine. There is scleroses of the bony structures compatible with known sickle cell disease. IMPRESSION: No evidence of excessive colonic stool. No bowel obstruction or ileus. Electronically Signed   By: David  Martinique M.D.   On: 11/06/2016 11:16    Background 66 y.o. year-old with DM, HTN, known CKD5, sickle cell. Followed by Dr. Marval Regal at [facility]. Has progressive disease, just referred back to Dr. Trula Slade for AVG placement (seen 5/31). Not clinically uremic. Presented on the current occasion with diffuse abdominal pain and distension, fevers to 102. WBC 24,500. CTA/P neg. ATB's started for possible sepsis. KUB c/w ileus. No obvious source for infection so ATB's stopped. WBC still >20,000, still having fevers.  Hb today down to 4.6 and pt to be transfused. We are asked to see because of creatinine of 5.41. Minimal difference in GFR (10 vs 12)  Assessment/Recommendations  1. CKD Stage 5 - not clinically uremic but approaching need for HD. Once GI/infecton issues sorted out, consult Dr. Trula Slade to go proceed with AVG placement. KVO fluids.  2. Anemia - CKD + SSA. No role for EPO levels (probably low with stage 5 CKD). For transfusion today. Clarify Fe status. Start Aranesp. 3. DM - insulin per [facility] 4. Secondary HPT- PTH 81 09/2016. Has not needed calcitriol. At home takes CA acetate 2 TIDac as binder. 1 with meals adequate at this time 5. Fever/leukocytosis - Blood cultures 6/2 negative. Persistent fevers and elevated WBC. CXR neg. CT abd/pelvis unremarkable. Was normal 8600 at his ER visit on 10/27/16. Felt possibly viral. ATB's to be stopped per Dr.  Zigmund Daniel.   Jamal Maes,  MD [facility] [phone] pager 11/07/2016, 4:47 PM

## 2016-11-08 ENCOUNTER — Inpatient Hospital Stay (HOSPITAL_COMMUNITY): Payer: Medicare HMO

## 2016-11-08 ENCOUNTER — Other Ambulatory Visit: Payer: Self-pay

## 2016-11-08 DIAGNOSIS — R0902 Hypoxemia: Secondary | ICD-10-CM

## 2016-11-08 DIAGNOSIS — N185 Chronic kidney disease, stage 5: Secondary | ICD-10-CM

## 2016-11-08 LAB — RENAL FUNCTION PANEL
ANION GAP: 12 (ref 5–15)
Albumin: 2.9 g/dL — ABNORMAL LOW (ref 3.5–5.0)
BUN: 58 mg/dL — ABNORMAL HIGH (ref 6–20)
CHLORIDE: 101 mmol/L (ref 101–111)
CO2: 19 mmol/L — AB (ref 22–32)
CREATININE: 5.26 mg/dL — AB (ref 0.61–1.24)
Calcium: 9 mg/dL (ref 8.9–10.3)
GFR, EST AFRICAN AMERICAN: 12 mL/min — AB (ref >60)
GFR, EST NON AFRICAN AMERICAN: 10 mL/min — AB (ref >60)
Glucose, Bld: 142 mg/dL — ABNORMAL HIGH (ref 65–99)
POTASSIUM: 5.5 mmol/L — AB (ref 3.5–5.1)
Phosphorus: 3.3 mg/dL (ref 2.5–4.6)
Sodium: 132 mmol/L — ABNORMAL LOW (ref 135–145)

## 2016-11-08 LAB — TROPONIN I
TROPONIN I: 0.03 ng/mL — AB (ref <0.03)
TROPONIN I: 0.05 ng/mL — AB (ref <0.03)

## 2016-11-08 LAB — PREPARE RBC (CROSSMATCH)

## 2016-11-08 LAB — CBC
HEMATOCRIT: 18.2 % — AB (ref 39.0–52.0)
Hemoglobin: 5.8 g/dL — CL (ref 13.0–17.0)
MCH: 20.7 pg — AB (ref 26.0–34.0)
MCHC: 31.9 g/dL (ref 30.0–36.0)
MCV: 65 fL — AB (ref 78.0–100.0)
PLATELETS: 267 10*3/uL (ref 150–400)
RBC: 2.8 MIL/uL — AB (ref 4.22–5.81)
RDW: 20.7 % — ABNORMAL HIGH (ref 11.5–15.5)
WBC: 23.6 10*3/uL — AB (ref 4.0–10.5)

## 2016-11-08 LAB — IRON AND TIBC
Iron: 16 ug/dL — ABNORMAL LOW (ref 45–182)
SATURATION RATIOS: 8 % — AB (ref 17.9–39.5)
TIBC: 207 ug/dL — ABNORMAL LOW (ref 250–450)
UIBC: 191 ug/dL

## 2016-11-08 LAB — DIFFERENTIAL
BASOS ABS: 0.1 10*3/uL (ref 0.0–0.1)
Basophils Relative: 0 %
EOS ABS: 0.7 10*3/uL (ref 0.0–0.7)
Eosinophils Relative: 3 %
LYMPHS ABS: 2.6 10*3/uL (ref 0.7–4.0)
Lymphocytes Relative: 11 %
MONO ABS: 4.1 10*3/uL — AB (ref 0.1–1.0)
Monocytes Relative: 16 %
NEUTROS PCT: 70 %
Neutro Abs: 17.6 10*3/uL — ABNORMAL HIGH (ref 1.7–7.7)

## 2016-11-08 LAB — RETICULOCYTES
RBC.: 2.8 MIL/uL — AB (ref 4.22–5.81)
RETIC COUNT ABSOLUTE: 170.8 10*3/uL (ref 19.0–186.0)
Retic Ct Pct: 6.1 % — ABNORMAL HIGH (ref 0.4–3.1)

## 2016-11-08 LAB — GLUCOSE, CAPILLARY
GLUCOSE-CAPILLARY: 115 mg/dL — AB (ref 65–99)
GLUCOSE-CAPILLARY: 156 mg/dL — AB (ref 65–99)
Glucose-Capillary: 121 mg/dL — ABNORMAL HIGH (ref 65–99)
Glucose-Capillary: 117 mg/dL — ABNORMAL HIGH (ref 65–99)
Glucose-Capillary: 163 mg/dL — ABNORMAL HIGH (ref 65–99)

## 2016-11-08 LAB — FOLATE RBC
FOLATE, HEMOLYSATE: 492.7 ng/mL
Folate, RBC: 3445 ng/mL (ref >498)
Hematocrit: 14.3 % — CL (ref 37.5–51.0)

## 2016-11-08 LAB — ERYTHROPOIETIN: ERYTHROPOIETIN: 211.1 m[IU]/mL — AB (ref 2.6–18.5)

## 2016-11-08 LAB — PATHOLOGIST SMEAR REVIEW

## 2016-11-08 LAB — FERRITIN: FERRITIN: 451 ng/mL — AB (ref 24–336)

## 2016-11-08 MED ORDER — FUROSEMIDE 40 MG PO TABS
40.0000 mg | ORAL_TABLET | Freq: Every day | ORAL | Status: DC
  Administered 2016-11-08 – 2016-11-12 (×5): 40 mg via ORAL
  Filled 2016-11-08 (×5): qty 1

## 2016-11-08 MED ORDER — PIPERACILLIN-TAZOBACTAM IN DEX 2-0.25 GM/50ML IV SOLN
2.2500 g | Freq: Three times a day (TID) | INTRAVENOUS | Status: DC
  Administered 2016-11-08 (×2): 2.25 g via INTRAVENOUS
  Filled 2016-11-08 (×4): qty 50

## 2016-11-08 MED ORDER — SODIUM BICARBONATE 650 MG PO TABS
650.0000 mg | ORAL_TABLET | Freq: Three times a day (TID) | ORAL | Status: DC
  Administered 2016-11-08 – 2016-11-12 (×12): 650 mg via ORAL
  Filled 2016-11-08 (×12): qty 1

## 2016-11-08 MED ORDER — ALBUTEROL SULFATE (2.5 MG/3ML) 0.083% IN NEBU
2.5000 mg | INHALATION_SOLUTION | RESPIRATORY_TRACT | Status: DC | PRN
  Administered 2016-11-08: 2.5 mg via RESPIRATORY_TRACT
  Filled 2016-11-08: qty 3

## 2016-11-08 MED ORDER — VANCOMYCIN HCL IN DEXTROSE 1-5 GM/200ML-% IV SOLN
1000.0000 mg | INTRAVENOUS | Status: DC
  Administered 2016-11-08: 1000 mg via INTRAVENOUS
  Filled 2016-11-08: qty 200

## 2016-11-08 MED ORDER — FERUMOXYTOL INJECTION 510 MG/17 ML
510.0000 mg | INTRAVENOUS | Status: DC
  Administered 2016-11-08: 510 mg via INTRAVENOUS
  Filled 2016-11-08: qty 17

## 2016-11-08 MED ORDER — GABAPENTIN 300 MG PO CAPS
300.0000 mg | ORAL_CAPSULE | Freq: Every day | ORAL | Status: DC
  Administered 2016-11-09 – 2016-11-11 (×3): 300 mg via ORAL
  Filled 2016-11-08 (×3): qty 1

## 2016-11-08 MED ORDER — SODIUM CHLORIDE 0.9 % IV SOLN
Freq: Once | INTRAVENOUS | Status: AC
  Administered 2016-11-08: 10 mL via INTRAVENOUS

## 2016-11-08 MED ORDER — FUROSEMIDE 10 MG/ML IJ SOLN
20.0000 mg | Freq: Once | INTRAMUSCULAR | Status: AC
  Administered 2016-11-08: 20 mg via INTRAVENOUS
  Filled 2016-11-08: qty 2

## 2016-11-08 MED ORDER — SORBITOL 70 % SOLN
960.0000 mL | TOPICAL_OIL | Freq: Once | ORAL | Status: AC
  Administered 2016-11-08: 960 mL via RECTAL
  Filled 2016-11-08: qty 240

## 2016-11-08 MED ORDER — ACETAMINOPHEN 500 MG PO TABS
500.0000 mg | ORAL_TABLET | Freq: Four times a day (QID) | ORAL | Status: DC | PRN
  Administered 2016-11-08 – 2016-11-09 (×3): 500 mg via ORAL
  Filled 2016-11-08 (×3): qty 1

## 2016-11-08 NOTE — Progress Notes (Signed)
SICKLE CELL SERVICE PROGRESS NOTE  [name] PRF:[id number] DOB: 1951-03-22 DOA: 11/04/2016 PCP: Nolene Ebbs, MD  Assessment/Plan: Active Problems:   Sickle cell anemia (HCC)   Diabetes mellitus (HCC)   Hyperkalemia, diminished renal excretion   Hb-SS disease without crisis ([place])   Chronic kidney disease (CKD), stage IV (severe) (HCC)   Abdominal pain  1. Hypoxia: Pt has had a weight gain 7 #'s since admission. Furthermore he is usually on Lasix at home which has been held since admission. I suspect that he has some fluid overload. Will resume lasix. This may also be contributing to rise in Creatinine.  2. Mild Hyperkalemia: Defer to Nephrology. 3. Anemia of Chronic Disease: Agree with Aranesp and Feraheme. Will transfuse another unit of blood today and give lasix after transfusion.  4. Minimally elevatedTroponin: Pt has no chest pain EKG from yesterday shows LBBB essentially unchanged from previous.  I suspect this represents demand. Will recheck troponin in the morning.   5. Leukocytosis: So far cultures are negative and patient has continued to have fevers despite antibiotics. I do not feel that his fevers ar a result of a bacterial infection and I am again discontinuing the antibiotics. The elevation of his WBC did not resolve with antibiotics and has remained above 20. With such a meteoritic rise in WBC from 8.6 to 21.5 the very next day he should be septic appearing and I would expect decreased BP and significant tachycardia and high lactic acids if in fact this was due to a bacterial infection. The patient is stable and I think that the wisest course is to observe without antibiotics and get additional cultures if he has further fevers. Furthermore a 1 view CXR does not offer much information in the way of distinguishing atelectasis from infection. 6. Abdominal Distension: Improved. Spoke wit his ex-wife who states that his abdomen is now about the size it has been in the past 6 months.   7. DM II: Continue Insulin 8. Constipation: KUB still shows a significant burden of stool burden despite Dulcolax suppositories. Will order SMOG.  9. Hb SS without crisis: Currently no indication for Dilaudid. Continue home medications.   Code Status: Full Code Family Communication: ex-wife (who now lives with patient) at bedside Disposition Plan: Not yet ready for discharge  [place].  Pager [phone]. If 7PM-7AM, please contact night-coverage.  11/08/2016, 5:19 PM  LOS: 4 days   Interim History: Pt c/o difficulty breathing with movement. He is requesting dilaudid and states that Oxycodone doesn't work unless he is walking around. I explained to patient that there is no relationship between effectiveness of opiates with waking versus being stationary. Pt denies any productive cough.   Consultants:  Dr. [name]: Nephrology  Procedures:  None  Antibiotics:  Vancomycin >>>6/6  Zosyn >>6/6    Objective: Vitals:   11/08/16 0209 11/08/16 0450 11/08/16 0622 11/08/16 0933  BP:  138/69  138/86  Pulse: 86 81  86  Resp: (!) 22   (!) 22  Temp:  (!) 101.1 F (38.4 C) 100 F (37.8 C) 98.4 F (36.9 C)  TempSrc:  Oral Oral Oral  SpO2:  95%  100%  Weight:      Height:       Weight change:   Intake/Output Summary (Last 24 hours) at 11/08/16 1719 Last data filed at 11/08/16 1527  Gross per 24 hour  Intake          1096.25 ml  Output  350 ml  Net           746.25 ml    .  Physical Exam General: Alert, awake, oriented x3, in mild distress only with transitional movements.  HEENT: [place]/AT PEERL, EOMI, anciteric Neck: Trachea midline,  no masses, no thyromegal,y no JVD, no carotid bruit OROPHARYNX:  Moist, No exudate/ erythema/lesions.  Heart: Regular rate and rhythm, without murmurs, rubs, gallops, PMI non-displaced, no heaves or thrills on palpation.  Lungs: Clear to auscultation, no wheezing or rhonchi noted but decreased breath sounds at bases.  No  increased vocal fremitus resonant to percussion  Abdomen: Soft, nontender, nondistended, positive bowel sounds, no masses no hepatosplenomegaly noted..  Neuro: No focal neurological deficits noted cranial nerves II through XII grossly intact. Strength at baseline in bilateral upper and lower extremities. Musculoskeletal: No warmth swelling or erythema around joints, no spinal tenderness noted. Psychiatric: Patient alert and oriented x3, good insight and cognition, good recent to remote recall.    Data Reviewed: Basic Metabolic Panel:  Recent Labs Lab 11/04/16 1920 11/05/16 0451 11/06/16 1229 11/07/16 1052 11/08/16 0539  NA 131* 132* 135 133* 132*  K 4.5 4.5 5.5* 5.2* 5.5*  CL 101 102 103 105 101  CO2 20* 23 18* 21* 19*  GLUCOSE 174* 167* 141* 161* 142*  BUN 50* 51* 52* 58* 58*  CREATININE 4.63* 4.53* 4.99* 5.41* 5.26*  CALCIUM 8.9 8.7* 8.9 8.8* 9.0  PHOS  --   --  4.3 3.9 3.3   Liver Function Tests:  Recent Labs Lab 11/04/16 1920 11/06/16 1228 11/06/16 1229 11/07/16 1052 11/08/16 0539  AST 27 23  --   --   --   ALT 14* 15*  --   --   --   ALKPHOS 70 57  --   --   --   BILITOT 1.3* 1.8*  --   --   --   PROT 7.5 7.2  --   --   --   ALBUMIN 3.2* 3.0* 3.2* 2.7* 2.9*    Recent Labs Lab 11/04/16 1920  LIPASE 31   No results for input(s): AMMONIA in the last 168 hours. CBC:  Recent Labs Lab 11/04/16 1920 11/05/16 0451 11/06/16 1228 11/07/16 1052 11/07/16 1518 11/08/16 0539  WBC 21.5* 23.5* 24.5* 22.1*  --  23.6*  NEUTROABS  --   --  16.2* 14.4*  --  17.6*  HGB 6.7* 6.1* 5.5* 4.6*  --  5.8*  HCT 20.7* 19.4* 16.7* 14.1* 14.3* 18.2*  MCV 64.7* 64.7* 64.5* 63.8*  --  65.0*  PLT 398 366 149* 312  --  267   Cardiac Enzymes:  Recent Labs Lab 11/08/16 0539 11/08/16 1414  TROPONINI 0.03* 0.05*   BNP (last 3 results) No results for input(s): BNP in the last 8760 hours.  ProBNP (last 3 results) No results for input(s): PROBNP in the last 8760  hours.  CBG:  Recent Labs Lab 11/07/16 2239 11/08/16 0402 11/08/16 0821 11/08/16 1150 11/08/16 1638  GLUCAP 180* 121* 117* 115* 163*    Recent Results (from the past 240 hour(s))  Blood Culture (routine x 2)     Status: None (Preliminary result)   Collection Time: 11/04/16 10:56 PM  Result Value Ref Range Status   Specimen Description BLOOD RIGHT ANTECUBITAL  Final   Special Requests   Final    BOTTLES DRAWN AEROBIC AND ANAEROBIC Blood Culture adequate volume   Culture NO GROWTH 3 DAYS  Final   Report Status PENDING  Incomplete  Blood Culture (routine x 2)     Status: None (Preliminary result)   Collection Time: 11/04/16 10:56 PM  Result Value Ref Range Status   Specimen Description BLOOD LEFT ANTECUBITAL  Final   Special Requests   Final    BOTTLES DRAWN AEROBIC AND ANAEROBIC Blood Culture adequate volume   Culture NO GROWTH 3 DAYS  Final   Report Status PENDING  Incomplete     Studies: Ct Abdomen Pelvis Wo Contrast  Result Date: 11/04/2016 CLINICAL DATA:  Mid abdominal pain, distention EXAM: CT ABDOMEN AND PELVIS WITHOUT CONTRAST TECHNIQUE: Multidetector CT imaging of the abdomen and pelvis was performed following the standard protocol without IV contrast. COMPARISON:  CT 09/03/2003 FINDINGS: Lower chest: Small bilateral pleural effusions with dependent bibasilar opacities, likely atelectasis. Heart is normal size. Hepatobiliary: No focal liver abnormality is seen. Status post cholecystectomy. No biliary dilatation. Pancreas: No focal abnormality or ductal dilatation. Spleen: Spleen is shrunken and diffusely calcified Adrenals/Urinary Tract: No renal or ureteral stones. No hydronephrosis. No focal renal abnormality. Adrenal glands and urinary bladder unremarkable. Stomach/Bowel: Normal appendix. Stomach, large and small bowel grossly unremarkable. Vascular/Lymphatic: No evidence of aneurysm or adenopathy. Reproductive: No visible focal abnormality. Other: No free fluid or free  air. Musculoskeletal: Diffuse mottled appearance of the bony structures, likely related to sickle cell disease. This is unchanged since prior study. IMPRESSION: No acute findings in the abdomen or pelvis. No evidence of bowel obstruction. Prior cholecystectomy. Shrunken, calcified spleen. Diffuse sclerosis and mottled appearance of the bones. Findings likely related to patient's sickle cell disease. Electronically Signed   By: Rolm Baptise M.D.   On: 11/04/2016 22:52   Dg Chest 2 View  Result Date: 11/04/2016 CLINICAL DATA:  Low back pain with weakness EXAM: CHEST  2 VIEW COMPARISON:  10/28/2016, 08/20/2016 FINDINGS: No focal consolidation or pleural effusion. Stable cardiomediastinal silhouette. No pneumothorax. Heterogenous, patchy sclerosis within both humeral heads, unchanged. IMPRESSION: No active cardiopulmonary disease. Electronically Signed   By: Donavan Foil M.D.   On: 11/04/2016 20:07   Dg Abd 1 View  Result Date: 11/08/2016 CLINICAL DATA:  Generalized abdominal pain and distension for the past 6 days without constipation or diarrhea or nausea or vomiting. EXAM: ABDOMEN - 1 VIEW COMPARISON:  KUB of November 06, 2016 FINDINGS: The colonic stool burden is moderate. There is stool in the rectum. No small or large bowel obstructive pattern is observed. There is chronic calcific density in the region of the spleen. There are surgical clips in the gallbladder fossa. There is scleroses of the vertebral bodies, portions of the pelvis, and both proximal femurs. IMPRESSION: Moderately increased colonic stool burden may reflect constipation in the appropriate clinical setting. There is no evidence bowel obstruction or ileus. Probable splenic calcification. Sclerotic foci within the lumbar spine pelvis and hips compatible with known sickle cell disease and bone infarcts. Electronically Signed   By: David  Martinique M.D.   On: 11/08/2016 13:52   Dg Abd 1 View  Result Date: 11/06/2016 CLINICAL DATA:  Constipation,  abdominal distension. EXAM: ABDOMEN - 1 VIEW COMPARISON:  Abdominopelvic CT scan of November 04, 2016 FINDINGS: The colonic stool burden does not appear increased. There is a moderate amount of gas within normal caliber colon and rectum. No small bowel gas or fluid collections are observed. There surgical clips in the gallbladder fossa. There degenerative changes of the lumbar spine. There is scleroses of the bony structures compatible with known sickle cell disease. IMPRESSION: No evidence of excessive colonic stool.  No bowel obstruction or ileus. Electronically Signed   By: David  Martinique M.D.   On: 11/06/2016 11:16   Dg Chest Port 1 View  Result Date: 11/08/2016 CLINICAL DATA:  Dyspnea and wheezing EXAM: PORTABLE CHEST 1 VIEW COMPARISON:  CXR exams from 11/04/2016 and 04/08/2004 FINDINGS: Heart is top-normal in size. Bibasilar airspace opacities and atelectasis noted at the lung bases. Pneumonia is not entirely excluded. No overt pulmonary edema. The left costophrenic angle is excluded. Probable trace right pleural effusion. Sclerotic appearance both humeral heads which may reflect changes of AVN. These date back to 2005 and therefore believed unlikely to represent osteoblastic disease. IMPRESSION: 1. Borderline cardiomegaly. 2. Bibasilar airspace disease suspicious for pneumonia and/or atelectasis. 3. Sclerotic appearance of both humeral heads which may reflect chronic AVN, dating back to prior studies from 2005. Electronically Signed   By: Ashley Royalty M.D.   On: 11/08/2016 02:21   Dg Abd Acute W/chest  Result Date: 10/28/2016 CLINICAL DATA:  Generalize weakness for 3 days. The lays, body aches, hypoglycemia. EXAM: DG ABDOMEN ACUTE W/ 1V CHEST COMPARISON:  Chest 08/10/2016 FINDINGS: Heart size and pulmonary vascularity are normal. No focal airspace disease or consolidation in the lungs. No blunting of costophrenic angles. No pneumothorax. Bone sclerosis involving the shoulders likely representing bone infarcts.  Scattered gas and stool throughout the colon. No small or large bowel distention. No free intra-abdominal air. No abnormal air-fluid levels. No radiopaque stones. Surgical clips in the right upper quadrant. Heterogeneous sclerotic bone changes involving the spine, sacrum, pelvis, and hips likely representing sickle cell changes. IMPRESSION: No evidence of active pulmonary disease. Normal nonobstructive bowel gas pattern with stool-filled colon. Diffuse bony changes consistent with sickle cell. Electronically Signed   By: Lucienne Capers M.D.   On: 10/28/2016 01:54    Scheduled Meds: . amLODipine  10 mg Oral Daily  . bisacodyl  10 mg Rectal Daily  . calcium acetate  667 mg Oral TID WC  . carvedilol  12.5 mg Oral BID WC  . darbepoetin (ARANESP) injection - NON-DIALYSIS  200 mcg Subcutaneous Q Tue-1800  . enoxaparin (LOVENOX) injection  30 mg Subcutaneous Q24H  . folic acid  2 mg Oral Daily  . gabapentin  300 mg Oral TID  . insulin aspart  0-9 Units Subcutaneous Q4H  . insulin glargine  10 Units Subcutaneous QHS  . pantoprazole  40 mg Oral Daily  . sodium bicarbonate  650 mg Oral TID   Continuous Infusions: . sodium chloride 75 mL/hr at 11/06/16 1206  . ferumoxytol Stopped (11/08/16 1441)  . piperacillin-tazobactam (ZOSYN)  IV Stopped (11/08/16 1456)    Active Problems:   Sickle cell anemia (HCC)   Diabetes mellitus (HCC)   Hyperkalemia, diminished renal excretion   Hb-SS disease without crisis ([place])   Chronic kidney disease (CKD), stage IV (severe) (HCC)   Abdominal pain       In excess of 40 minutes spent during this visit. Greater than 50% involved face to face contact with the patient for assessment, counseling and coordination of care.

## 2016-11-08 NOTE — Progress Notes (Addendum)
Pt with sorbitol enema ordered but declines. Pt inquiring if he can have something oral instead. Page to Tylene Fantasia for notification. Per Baltazar Najjar - due to the location of patients stool an oral supplement would not be effective. Pt educated and agrees to enema at this time. Dorthey Sawyer, RN

## 2016-11-08 NOTE — Progress Notes (Signed)
Pt began complaining of being SOB and pt did start experiencing some bilateral wheezing that had worsened. Baltazar Najjar, NP was notified and ordered PRN nebulizer and cst x-ray. Pt then began complaining of 10/10 chest and abdominal pain. EKG was performed and was normal. Nebulizer was administer with pain medication and pt felt relief. VSS and will continue to monitor pt.

## 2016-11-08 NOTE — Progress Notes (Signed)
Pharmacy Antibiotic Note  [name] is a 66 y.o. male admitted on 11/04/2016 with sepsis but ABX stopped 2/2 likely viral etiology, now w/ new CXR concerning for atelectasis vs PNA, to resume ABX.  Pharmacy has been consulted for Vancocin and Zosyn dosing.  Of note pt is stage V CKD, approaching need for HD but not currently getting HD.  Plan: Vancomycin 1000mg  IV every 48 hours.  Goal trough 15-20 mcg/mL. Zosyn 32.25g IV every 8 hours.  Height: 6\' 1"  (185.4 cm) Weight: 192 lb 4.8 oz (87.2 kg) IBW/kg (Calculated) : 79.9  Temp (24hrs), Avg:100.1 F (37.8 C), Min:98.3 F (36.8 C), Max:100.9 F (38.3 C)   Recent Labs Lab 11/04/16 1920 11/04/16 1947 11/04/16 2311 11/05/16 0451 11/06/16 1228 11/06/16 1229 11/07/16 1052  WBC 21.5*  --   --  23.5* 24.5*  --  22.1*  CREATININE 4.63*  --   --  4.53*  --  4.99* 5.41*  LATICACIDVEN  --  1.48 1.02  --   --   --   --     Estimated Creatinine Clearance: 14.8 mL/min (A) (by C-G formula based on SCr of 5.41 mg/dL (H)).    Allergies  Allergen Reactions  . Morphine And Related Itching    Antimicrobials this admission: Vanc 6/2 >> 6/3; 6/6 >>  Zosyn 6/2 >> 6/5; 6/6 >>  Microbiology results: 6/2 BCx: NGTD  Thank you for allowing pharmacy to be a part of this patient's care.  Wynona Neat, PharmD, BCPS  11/08/2016 2:35 AM

## 2016-11-08 NOTE — Progress Notes (Signed)
[facility] Rounding Note  Subjective/Interval History:  Says bowels moving some but still bloated and distended  Objective Vital signs in last 24 hours: Vitals:   11/08/16 0209 11/08/16 0450 11/08/16 0622 11/08/16 0933  BP:  138/69  138/86  Pulse: 86 81  86  Resp: (!) 22   (!) 22  Temp:  (!) 101.1 F (38.4 C) 100 F (37.8 C) 98.4 F (36.9 C)  TempSrc:  Oral Oral Oral  SpO2:  95%  100%  Weight:      Height:       Weight change:   Intake/Output Summary (Last 24 hours) at 11/08/16 1224 Last data filed at 11/08/16 0933  Gross per 24 hour  Intake           976.25 ml  Output              300 ml  Net           676.25 ml   Physical Exam:  Blood pressure 138/86, pulse 86, temperature 98.4 F (36.9 C), temperature source Oral, resp. rate (!) 22, height 6\' 1"  (1.854 m), weight 87.2 kg (192 lb 4.8 oz), SpO2 100 %.   Very nice chronically ill appearing AAM Says abdominal pain an issue Skin: no rash, cyanosis No JVD Chest now with few base crackles Regular S1S2 No S3 Abdomen protuberant. Quiet. Moderate diffuse tenderness 1+ edema both LE's No asterixus   Recent Labs Lab 11/04/16 1920 11/05/16 0451 11/06/16 1229 11/07/16 1052 11/08/16 0539  NA 131* 132* 135 133* 132*  K 4.5 4.5 5.5* 5.2* 5.5*  CL 101 102 103 105 101  CO2 20* 23 18* 21* 19*  GLUCOSE 174* 167* 141* 161* 142*  BUN 50* 51* 52* 58* 58*  CREATININE 4.63* 4.53* 4.99* 5.41* 5.26*  CALCIUM 8.9 8.7* 8.9 8.8* 9.0  PHOS  --   --  4.3 3.9 3.3    Recent Labs Lab 11/04/16 1920 11/06/16 1228 11/06/16 1229 11/07/16 1052 11/08/16 0539  AST 27 23  --   --   --   ALT 14* 15*  --   --   --   ALKPHOS 70 57  --   --   --   BILITOT 1.3* 1.8*  --   --   --   PROT 7.5 7.2  --   --   --   ALBUMIN 3.2* 3.0* 3.2* 2.7* 2.9*    Recent Labs Lab 11/04/16 1920  LIPASE 31     Recent Labs Lab 11/05/16 0451 11/06/16 1228 11/07/16 1052 11/08/16 0539  WBC 23.5* 24.5* 22.1* 23.6*  NEUTROABS  --  16.2* 14.4* PENDING   HGB 6.1* 5.5* 4.6* 5.8*  HCT 19.4* 16.7* 14.1* 18.2*  MCV 64.7* 64.5* 63.8* 65.0*  PLT 366 149* 312 267   Recent Labs Lab 11/08/16 0539  TROPONINI 0.03*    Recent Labs Lab 11/07/16 1631 11/07/16 2239 11/08/16 0402 11/08/16 0821 11/08/16 1150  GLUCAP 179* 180* 121* 117* 115*    Iron/TIBC/Ferritin/ %Sat    Component Value Date/Time   IRON 16 (L) 11/08/2016 0539   TIBC 207 (L) 11/08/2016 0539   FERRITIN 451 (H) 11/08/2016 0539   IRONPCTSAT 8 (L) 11/08/2016 0539   Studies/Results: Dg Chest Port 1 View  Result Date: 11/08/2016 CLINICAL DATA:  Dyspnea and wheezing EXAM: PORTABLE CHEST 1 VIEW COMPARISON:  CXR exams from 11/04/2016 and 04/08/2004 FINDINGS: Heart is top-normal in size. Bibasilar airspace opacities and atelectasis noted at the lung bases. Pneumonia is not  entirely excluded. No overt pulmonary edema. The left costophrenic angle is excluded. Probable trace right pleural effusion. Sclerotic appearance both humeral heads which may reflect changes of AVN. These date back to 2005 and therefore believed unlikely to represent osteoblastic disease. IMPRESSION: 1. Borderline cardiomegaly. 2. Bibasilar airspace disease suspicious for pneumonia and/or atelectasis. 3. Sclerotic appearance of both humeral heads which may reflect chronic AVN, dating back to prior studies from 2005. Electronically Signed   By: Ashley Royalty M.D.   On: 11/08/2016 02:21   Medications: . sodium chloride 75 mL/hr at 11/06/16 1206  . piperacillin-tazobactam (ZOSYN)  IV Stopped (11/08/16 7858)  . vancomycin Stopped (11/08/16 0507)   . amLODipine  10 mg Oral Daily  . bisacodyl  10 mg Rectal Daily  . calcium acetate  667 mg Oral TID WC  . carvedilol  12.5 mg Oral BID WC  . darbepoetin (ARANESP) injection - NON-DIALYSIS  200 mcg Subcutaneous Q Tue-1800  . enoxaparin (LOVENOX) injection  30 mg Subcutaneous Q24H  . folic acid  2 mg Oral Daily  . gabapentin  300 mg Oral TID  . insulin aspart  0-9 Units  Subcutaneous Q4H  . insulin glargine  10 Units Subcutaneous QHS  . pantoprazole  40 mg Oral Daily   Background 66 y.o. year-old with DM, HTN, known CKD5, sickle cell. Followed by Dr. Marval Regal at [facility]. Has progressive disease, just referred back to Dr. Trula Slade for AVG placement (seen 5/31). Not clinically uremic. Presented on the current occasion with diffuse abdominal pain and distension, fevers to 102. WBC 24,500. CTA/P neg. ATB's started for possible sepsis. KUB not c/w ileus. No obvious source for infection so ATB's stopped. WBC still >20,000, still having fevers.  Hb on 6/5 down to 4.6 and pt to be transfused. We are asked to see because of creatinine of 5.41.  Assessment/Recommendations  1. CKD Stage 5 - not clinically uremic but approaching need for HD. Once GI/infecton issues sorted out, consult Dr. Trula Slade to go proceed with AVG placement. No emerging need for HD at this time 2. Hyperkalemia - K 5.5. Restrict in diet. Hesitate to use kayexalate w/current abd findings 3. Metabolic acidosis - start bicarb po 4. Anemia - CKD + SSA. No role for EPO levels (probably low with stage 5 CKD). Transfused 1 unit yesterday. Hb still <6. I would recommend 2nd unit today - primary service has not yet seen. TSat low and needs IV repletion. Feraheme X2 doses. Aranesp 200/week started yesterday 5. DM - insulin per TR[facility] 6. Secondary HPT- PTH 81 09/2016. Has not needed calcitriol. At home takes CA acetate 2 TIDac as binder. 1 with meals adequate at this time 7. Fever/leukocytosis - Blood cultures 6/2 negative. Persistent fevers and elevated WBC. CXR neg. CT abd/pelvis unremarkable. KUB neg. WBC was normal 8600 at his ER visit on 10/27/16, now >20K. ATB's stopped transiently by Dr. Zigmund Daniel then restarted out of concern for PNA 8. Abdominal pain - this seems to be his biggest c/o at this time. Repeat KUB given current degree of distension.  Jamal Maes, MD [facility] [phone]  pager 11/08/2016, 12:24 PM

## 2016-11-09 DIAGNOSIS — D72829 Elevated white blood cell count, unspecified: Secondary | ICD-10-CM

## 2016-11-09 LAB — CBC WITH DIFFERENTIAL/PLATELET
Basophils Absolute: 0 10*3/uL (ref 0.0–0.1)
Basophils Relative: 0 %
EOS PCT: 3 %
Eosinophils Absolute: 0.6 10*3/uL (ref 0.0–0.7)
HCT: 23.2 % — ABNORMAL LOW (ref 39.0–52.0)
HEMOGLOBIN: 7.4 g/dL — AB (ref 13.0–17.0)
LYMPHS ABS: 2.6 10*3/uL (ref 0.7–4.0)
Lymphocytes Relative: 12 %
MCH: 21.5 pg — ABNORMAL LOW (ref 26.0–34.0)
MCHC: 31.9 g/dL (ref 30.0–36.0)
MCV: 67.4 fL — ABNORMAL LOW (ref 78.0–100.0)
MONOS PCT: 13 %
Monocytes Absolute: 2.8 10*3/uL — ABNORMAL HIGH (ref 0.1–1.0)
Neutro Abs: 15.6 10*3/uL — ABNORMAL HIGH (ref 1.7–7.7)
Neutrophils Relative %: 72 %
PLATELETS: 362 10*3/uL (ref 150–400)
RBC: 3.44 MIL/uL — AB (ref 4.22–5.81)
RDW: 22.4 % — ABNORMAL HIGH (ref 11.5–15.5)
WBC: 21.6 10*3/uL — ABNORMAL HIGH (ref 4.0–10.5)

## 2016-11-09 LAB — GLUCOSE, CAPILLARY
GLUCOSE-CAPILLARY: 95 mg/dL (ref 65–99)
GLUCOSE-CAPILLARY: 99 mg/dL (ref 65–99)
Glucose-Capillary: 106 mg/dL — ABNORMAL HIGH (ref 65–99)
Glucose-Capillary: 130 mg/dL — ABNORMAL HIGH (ref 65–99)
Glucose-Capillary: 133 mg/dL — ABNORMAL HIGH (ref 65–99)

## 2016-11-09 LAB — RENAL FUNCTION PANEL
ANION GAP: 13 (ref 5–15)
Albumin: 3.2 g/dL — ABNORMAL LOW (ref 3.5–5.0)
BUN: 57 mg/dL — ABNORMAL HIGH (ref 6–20)
CHLORIDE: 103 mmol/L (ref 101–111)
CO2: 19 mmol/L — AB (ref 22–32)
Calcium: 9.6 mg/dL (ref 8.9–10.3)
Creatinine, Ser: 5.34 mg/dL — ABNORMAL HIGH (ref 0.61–1.24)
GFR calc non Af Amer: 10 mL/min — ABNORMAL LOW (ref >60)
GFR, EST AFRICAN AMERICAN: 12 mL/min — AB (ref >60)
Glucose, Bld: 92 mg/dL (ref 65–99)
POTASSIUM: 4.9 mmol/L (ref 3.5–5.1)
Phosphorus: 4.3 mg/dL (ref 2.5–4.6)
Sodium: 135 mmol/L (ref 135–145)

## 2016-11-09 LAB — RETICULOCYTES
RBC.: 3.44 MIL/uL — AB (ref 4.22–5.81)
RETIC COUNT ABSOLUTE: 227 10*3/uL — AB (ref 19.0–186.0)
RETIC CT PCT: 6.6 % — AB (ref 0.4–3.1)

## 2016-11-09 MED ORDER — SENNOSIDES-DOCUSATE SODIUM 8.6-50 MG PO TABS
1.0000 | ORAL_TABLET | Freq: Two times a day (BID) | ORAL | Status: DC
  Administered 2016-11-09 – 2016-11-12 (×6): 1 via ORAL
  Filled 2016-11-09 (×6): qty 1

## 2016-11-09 MED ORDER — OXYCODONE HCL 5 MG PO TABS
10.0000 mg | ORAL_TABLET | ORAL | Status: DC | PRN
  Administered 2016-11-09 – 2016-11-11 (×7): 10 mg via ORAL
  Filled 2016-11-09 (×7): qty 2

## 2016-11-09 NOTE — Progress Notes (Addendum)
Pt with large BM x 1. Will continue to monitor for further stools. Dorthey Sawyer, RN

## 2016-11-09 NOTE — Care Management Note (Signed)
Case Management Note  Patient Details  Name: [name] MRN: [id number] Date of Birth: August 13, 1950  Subjective/Objective:   CM following for progression and d/c planning.                  Action/Plan: 11/09/16 Noted pt home with wife, will arrange any HH or DME needs at the time of d/c. Continuing to follow.   Expected Discharge Date:                  Expected Discharge Plan:  [place]  In-House Referral:  Clinical Social Work  Discharge planning Services  CM Consult  Post Acute Care Choice:    Choice offered to:     DME Arranged:    DME Agency:     HH Arranged:    [place] Agency:     Status of Service:  In process, will continue to follow  If discussed at Long Length of Stay Meetings, dates discussed:    Additional Comments:  Adron Bene, RN 11/09/2016, 4:27 PM

## 2016-11-09 NOTE — Progress Notes (Signed)
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 Apparently gets distension with milk - drank some this AM anyway Still with low grade temps  Got 2nd unit blood last PM  Objective Vital signs in last 24 hours: Vitals:   11/08/16 2044 11/09/16 0430 11/09/16 0900 11/09/16 1008  BP: (!) 147/73 (!) 149/82 (!) 154/73 (!) 154/73  Pulse: 74 82 81 81  Resp: 18 18 20    Temp: 99.3 F (37.4 C) 99.5 F (37.5 C) 99.4 F (37.4 C)   TempSrc: Oral Oral Oral   SpO2: 100% 95% 98%   Weight:    86.2 kg (190 lb)  Height:       Weight change:   Intake/Output Summary (Last 24 hours) at 11/09/16 1251 Last data filed at 11/09/16 0431  Gross per 24 hour  Intake              815 ml  Output              950 ml  Net             -135 ml   Physical Exam:  Blood pressure (!) 154/73, pulse 81, temperature 99.4 F (37.4 C), temperature source Oral, resp. rate 20, height 6\' 1"  (1.854 m), weight 86.2 kg (190 lb), SpO2 98 %.   Very nice chronically ill appearing AAM Sitting on [employer] Skin: no rash, cyanosis No JVD Chest now with few base crackles Regular S1S2 No S3 Abdomen protuberant but less so and less tender 1+ edema both LE's No asterixus   Recent Labs Lab 11/04/16 1920 11/05/16 0451 11/06/16 1229 11/07/16 1052 11/08/16 0539 11/09/16 0430  NA 131* 132* 135 133* 132* 135  K 4.5 4.5 5.5* 5.2* 5.5* 4.9  CL 101 102 103 105 101 103  CO2 20* 23 18* 21* 19* 19*  GLUCOSE 174* 167* 141* 161* 142* 92  BUN 50* 51* 52* 58* 58* 57*  CREATININE 4.63* 4.53* 4.99* 5.41* 5.26* 5.34*  CALCIUM 8.9 8.7* 8.9 8.8* 9.0 9.6  PHOS  --   --  4.3 3.9 3.3 4.3    Recent Labs Lab 11/04/16 1920 11/06/16 1228  11/07/16 1052 11/08/16 0539 11/09/16 0430  AST 27 23  --   --   --   --   ALT 14* 15*  --   --   --   --   ALKPHOS 70 57  --   --   --   --   BILITOT 1.3* 1.8*  --   --   --   --    PROT 7.5 7.2  --   --   --   --   ALBUMIN 3.2* 3.0*  < > 2.7* 2.9* 3.2*  < > = values in this interval not displayed.  Recent Labs Lab 11/04/16 1920  LIPASE 31     Recent Labs Lab 11/06/16 1228 11/07/16 1052 11/07/16 1518 11/08/16 0539 11/09/16 0430  WBC 24.5* 22.1*  --  23.6* 21.6*  NEUTROABS 16.2* 14.4*  --  17.6* 15.6*  HGB 5.5* 4.6*  --  5.8* 7.4*  HCT 16.7* 14.1* 14.3* 18.2* 23.2*  MCV 64.5* 63.8*  --  65.0* 67.4*  PLT 149* 312  --  267 362    Recent Labs Lab 11/08/16 0539 11/08/16 1414  TROPONINI 0.03* 0.05*    Recent Labs Lab 11/08/16 2048 11/09/16 0021 11/09/16 0419 11/09/16 5631  11/09/16 1204  GLUCAP 156* 95 99 106* 130*    Iron/TIBC/Ferritin/ %Sat    Component Value Date/Time   IRON 16 (L) 11/08/2016 0539   TIBC 207 (L) 11/08/2016 0539   FERRITIN 451 (H) 11/08/2016 0539   IRONPCTSAT 8 (L) 11/08/2016 0539   Studies/Results: Dg Abd 1 View  Result Date: 11/08/2016 CLINICAL DATA:  Generalized abdominal pain and distension for the past 6 days without constipation or diarrhea or nausea or vomiting. EXAM: ABDOMEN - 1 VIEW COMPARISON:  KUB of November 06, 2016 FINDINGS: The colonic stool burden is moderate. There is stool in the rectum. No small or large bowel obstructive pattern is observed. There is chronic calcific density in the region of the spleen. There are surgical clips in the gallbladder fossa. There is scleroses of the vertebral bodies, portions of the pelvis, and both proximal femurs. IMPRESSION: Moderately increased colonic stool burden may reflect constipation in the appropriate clinical setting. There is no evidence bowel obstruction or ileus. Probable splenic calcification. Sclerotic foci within the lumbar spine pelvis and hips compatible with known sickle cell disease and bone infarcts. Electronically Signed   By: David  Martinique M.D.   On: 11/08/2016 13:52   Dg Chest Port 1 View  Result Date: 11/08/2016 CLINICAL DATA:  Dyspnea and wheezing EXAM:  PORTABLE CHEST 1 VIEW COMPARISON:  CXR exams from 11/04/2016 and 04/08/2004 FINDINGS: Heart is top-normal in size. Bibasilar airspace opacities and atelectasis noted at the lung bases. Pneumonia is not entirely excluded. No overt pulmonary edema. The left costophrenic angle is excluded. Probable trace right pleural effusion. Sclerotic appearance both humeral heads which may reflect changes of AVN. These date back to 2005 and therefore believed unlikely to represent osteoblastic disease. IMPRESSION: 1. Borderline cardiomegaly. 2. Bibasilar airspace disease suspicious for pneumonia and/or atelectasis. 3. Sclerotic appearance of both humeral heads which may reflect chronic AVN, dating back to prior studies from 2005. Electronically Signed   By: Ashley Royalty M.D.   On: 11/08/2016 02:21   Medications: . sodium chloride 75 mL/hr at 11/06/16 1206  . ferumoxytol Stopped (11/08/16 1441)   . amLODipine  10 mg Oral Daily  . bisacodyl  10 mg Rectal Daily  . calcium acetate  667 mg Oral TID WC  . carvedilol  12.5 mg Oral BID WC  . darbepoetin (ARANESP) injection - NON-DIALYSIS  200 mcg Subcutaneous Q Tue-1800  . enoxaparin (LOVENOX) injection  30 mg Subcutaneous Q24H  . folic acid  2 mg Oral Daily  . furosemide  40 mg Oral Daily  . gabapentin  300 mg Oral QHS  . insulin aspart  0-9 Units Subcutaneous Q4H  . insulin glargine  10 Units Subcutaneous QHS  . pantoprazole  40 mg Oral Daily  . sodium bicarbonate  650 mg Oral TID   Background 66 y.o. year-old with DM, HTN, known CKD5, sickle cell. Followed by Dr. Marval Regal at [facility]. Has progressive disease, just referred back to Dr. Trula Slade for AVG placement (seen 5/31). Not clinically uremic. Presented on the current occasion with diffuse abdominal pain and distension, fevers to 102. WBC 24,500. CTA/P neg. ATB's started for possible sepsis. KUB not c/w ileus. No obvious source for infection so ATB's stopped. WBC still >20,000, still having fevers.  Hb on 6/5 down to  4.6 and pt to be transfused. We were asked to see because of creatinine of 5.41.  Assessment/Recommendations  1. CKD Stage 5 - not clinically uremic but approaching need for HD. Once GI/infecton issues sorted out, consult  Dr. Trula Slade to go proceed with AVG placement. No emerging need for HD at this time 2. Hyperkalemia - K today finally <5. Restrict in diet.  3. Metabolic acidosis - started bicarb po 650 TID 4. Anemia - CKD + SSA. No role for EPO levels (probably low with stage 5 CKD). Transfused 2 units this admission for Hb nadir 4.6. TSat low and Feraheme X2 doses ordered (1st dose was 11/08/16) . Aranesp 200/week started 6/5.  5. DM - insulin per [facility] 6. Secondary HPT- PTH 81 09/2016. Has not needed calcitriol. At home takes CA acetate 2 TIDac as binder. 1 with meals adequate at this time 7. Fever/leukocytosis - Blood cultures 6/2 negative. Persistent fevers and elevated WBC. CXR neg. CT abd/pelvis unremarkable. KUB neg. WBC was normal 8600 at his ER visit on 10/27/16, now >20K. ATB's stopped transiently by Dr. Zigmund Daniel then restarted out of concern for PNA, now stopped again. WBC remains elevated 8. Abdominal pain - much improved after BM's. (large stool burden on KUB yesterday). Also appears is lactose intolerant (but drank milk again this AM)  Renal disposition:   Nothing else really for me to add.   Not uremic.    Back on usual lasix.   Phos OK on reduced binder dose.   Na Bicarb started for acidosis.    Has rec'd dose of Feraheme for Fe def and a 200 mcg dose of Aranesp.   We can arrange for outpt Aranesp at his next OV w/Dr. Geanie Kenning.   He will need to have AVG scheduled with Dr. Trula Slade once fever/WBC normalized.   Jamal Maes, MD [facility] [phone] pager 11/09/2016, 12:51 PM

## 2016-11-09 NOTE — Progress Notes (Signed)
Pt with small soft BM. Dorthey Sawyer, RN

## 2016-11-09 NOTE — Progress Notes (Signed)
Pt with second large soft BM.

## 2016-11-09 NOTE — Progress Notes (Signed)
SICKLE CELL SERVICE PROGRESS NOTE  [name] NTI:[id number] DOB: 12-Mar-1951 DOA: 11/04/2016 PCP: Nolene Ebbs, MD  Assessment/Plan: Active Problems:   Sickle cell anemia (HCC)   Diabetes mellitus (HCC)   Hyperkalemia, diminished renal excretion   Hb-SS disease without crisis ([place])   Chronic kidney disease (CKD), stage IV (severe) (HCC)   Abdominal pain  1. Hypoxia: I checked saturations myself and patient is 98% on RA at rest and 92% with ambulation on RA. Continue lasix for improved diuresis. Will also check 2-D ECHO as he is at risk for PAH and had mild PAH on ECHO 7 months ago with TRV >250 cm/s.  2. Leukocytosis: Pt has had a mild decrease today bit he has had no fevers despite being without antibiotics. I expect to see further reductions in WBC. 3. Fever: he has had no fevers in the last 24 hours. So far all cultures have been negative.  4. Abdominal Distension and Abdominal Pain: Distension has resolved. He may be having mesenteric ischemia associated with Sickle Cell Disease and  low Hb. Hopefully pain will improve now that Hb increased. 5. Constipation: Pt had a good response to SMOG enema. He should continue Senna-S. 6. Mild Hyperkalemia: Defer to Nephrology. 7. Anemia of Chronic Disease: Agree with Aranesp and Feraheme. Do not recommend any further transfusions at this time. 8. Minimally elevatedTroponin: Pt has no chest pain EKG from yesterday shows LBBB essentially unchanged from previous.  I suspect this represents demand. Will recheck troponin in the morning.   9. DM II: Continue Insulin 10. Hb SS without crisis: Currently no indication for Dilaudid. Continue Oxycodone PRN. Consider weaning back down to 5 mg.    Code Status: Full Code Family Communication: ex-wife (who now lives with patient) at bedside Disposition Plan: Anticipate discharge in 2-3 days.  MATTHEWS,MICHELLE A.  Pager [phone]. If 7PM-7AM, please contact night-coverage.  11/09/2016, 4:34 PM  LOS: 5 days    Interim History: Pt much more energetic today. He was able to walk with me in the halls with only SBA. He reports pain in his belly and back. Pain in the belly is a nagging pain and pain in the back is 7/10 before receiving Oxycodone. He had a large BM after enema yesterday. Pt spoke to students about Sickle Cell Disease and was very animated and appeared in his element while doing this.    Consultants:  Dr. [name]: Nephrology  Procedures:  None  Antibiotics:  Vancomycin >>>6/6  Zosyn >>6/6    Objective: Vitals:   11/08/16 2044 11/09/16 0430 11/09/16 0900 11/09/16 1008  BP: (!) 147/73 (!) 149/82 (!) 154/73 (!) 154/73  Pulse: 74 82 81 81  Resp: 18 18 20    Temp: 99.3 F (37.4 C) 99.5 F (37.5 C) 99.4 F (37.4 C)   TempSrc: Oral Oral Oral   SpO2: 100% 95% 98%   Weight:    86.2 kg (190 lb)  Height:       Weight change:   Intake/Output Summary (Last 24 hours) at 11/09/16 1634 Last data filed at 11/09/16 1600  Gross per 24 hour  Intake             1015 ml  Output             1200 ml  Net             -185 ml    .  Physical Exam General: Alert, awake, oriented x3, in no apparent distress. Marland Kitchen  HEENT: [place]/AT PEERL, EOMI,  anciteric Heart: Regular rate and rhythm, without murmurs, rubs, gallops, PMI non-displaced, no heaves or thrills on palpation.  Lungs: Clear to auscultation with good air entry except at bases where breath sounds were decreased. There was no wheezing or rhonchi present but he does have some upper airway sounds present..  No increased vocal fremitus resonant to percussion.  Abdomen: Soft,  With general mild tenderness noted on deep palpation , nondistended, positive bowel sounds, no masses no hepatosplenomegaly noted.  Neuro: No focal neurological deficits noted cranial nerves II through XII grossly intact. Strength at baseline in bilateral upper and lower extremities. Musculoskeletal: No warmth swelling or erythema around joints, no spinal tenderness  noted. Psychiatric: Patient alert and oriented x3, good insight and cognition, good recent to remote recall.    Data Reviewed: Basic Metabolic Panel:  Recent Labs Lab 11/05/16 0451 11/06/16 1229 11/07/16 1052 11/08/16 0539 11/09/16 0430  NA 132* 135 133* 132* 135  K 4.5 5.5* 5.2* 5.5* 4.9  CL 102 103 105 101 103  CO2 23 18* 21* 19* 19*  GLUCOSE 167* 141* 161* 142* 92  BUN 51* 52* 58* 58* 57*  CREATININE 4.53* 4.99* 5.41* 5.26* 5.34*  CALCIUM 8.7* 8.9 8.8* 9.0 9.6  PHOS  --  4.3 3.9 3.3 4.3   Liver Function Tests:  Recent Labs Lab 11/04/16 1920 11/06/16 1228 11/06/16 1229 11/07/16 1052 11/08/16 0539 11/09/16 0430  AST 27 23  --   --   --   --   ALT 14* 15*  --   --   --   --   ALKPHOS 70 57  --   --   --   --   BILITOT 1.3* 1.8*  --   --   --   --   PROT 7.5 7.2  --   --   --   --   ALBUMIN 3.2* 3.0* 3.2* 2.7* 2.9* 3.2*    Recent Labs Lab 11/04/16 1920  LIPASE 31   No results for input(s): AMMONIA in the last 168 hours. CBC:  Recent Labs Lab 11/05/16 0451 11/06/16 1228 11/07/16 1052 11/07/16 1518 11/08/16 0539 11/09/16 0430  WBC 23.5* 24.5* 22.1*  --  23.6* 21.6*  NEUTROABS  --  16.2* 14.4*  --  17.6* 15.6*  HGB 6.1* 5.5* 4.6*  --  5.8* 7.4*  HCT 19.4* 16.7* 14.1* 14.3* 18.2* 23.2*  MCV 64.7* 64.5* 63.8*  --  65.0* 67.4*  PLT 366 149* 312  --  267 362   Cardiac Enzymes:  Recent Labs Lab 11/08/16 0539 11/08/16 1414  TROPONINI 0.03* 0.05*   BNP (last 3 results) No results for input(s): BNP in the last 8760 hours.  ProBNP (last 3 results) No results for input(s): PROBNP in the last 8760 hours.  CBG:  Recent Labs Lab 11/08/16 2048 11/09/16 0021 11/09/16 0419 11/09/16 0739 11/09/16 1204  GLUCAP 156* 95 99 106* 130*    Recent Results (from the past 240 hour(s))  Blood Culture (routine x 2)     Status: None (Preliminary result)   Collection Time: 11/04/16 10:56 PM  Result Value Ref Range Status   Specimen Description BLOOD RIGHT  ANTECUBITAL  Final   Special Requests   Final    BOTTLES DRAWN AEROBIC AND ANAEROBIC Blood Culture adequate volume   Culture NO GROWTH 4 DAYS  Final   Report Status PENDING  Incomplete  Blood Culture (routine x 2)     Status: None (Preliminary result)   Collection Time: 11/04/16 10:56  PM  Result Value Ref Range Status   Specimen Description BLOOD LEFT ANTECUBITAL  Final   Special Requests   Final    BOTTLES DRAWN AEROBIC AND ANAEROBIC Blood Culture adequate volume   Culture NO GROWTH 4 DAYS  Final   Report Status PENDING  Incomplete     Studies: Ct Abdomen Pelvis Wo Contrast  Result Date: 11/04/2016 CLINICAL DATA:  Mid abdominal pain, distention EXAM: CT ABDOMEN AND PELVIS WITHOUT CONTRAST TECHNIQUE: Multidetector CT imaging of the abdomen and pelvis was performed following the standard protocol without IV contrast. COMPARISON:  CT 09/03/2003 FINDINGS: Lower chest: Small bilateral pleural effusions with dependent bibasilar opacities, likely atelectasis. Heart is normal size. Hepatobiliary: No focal liver abnormality is seen. Status post cholecystectomy. No biliary dilatation. Pancreas: No focal abnormality or ductal dilatation. Spleen: Spleen is shrunken and diffusely calcified Adrenals/Urinary Tract: No renal or ureteral stones. No hydronephrosis. No focal renal abnormality. Adrenal glands and urinary bladder unremarkable. Stomach/Bowel: Normal appendix. Stomach, large and small bowel grossly unremarkable. Vascular/Lymphatic: No evidence of aneurysm or adenopathy. Reproductive: No visible focal abnormality. Other: No free fluid or free air. Musculoskeletal: Diffuse mottled appearance of the bony structures, likely related to sickle cell disease. This is unchanged since prior study. IMPRESSION: No acute findings in the abdomen or pelvis. No evidence of bowel obstruction. Prior cholecystectomy. Shrunken, calcified spleen. Diffuse sclerosis and mottled appearance of the bones. Findings likely related  to patient's sickle cell disease. Electronically Signed   By: Rolm Baptise M.D.   On: 11/04/2016 22:52   Dg Chest 2 View  Result Date: 11/04/2016 CLINICAL DATA:  Low back pain with weakness EXAM: CHEST  2 VIEW COMPARISON:  10/28/2016, 08/20/2016 FINDINGS: No focal consolidation or pleural effusion. Stable cardiomediastinal silhouette. No pneumothorax. Heterogenous, patchy sclerosis within both humeral heads, unchanged. IMPRESSION: No active cardiopulmonary disease. Electronically Signed   By: Donavan Foil M.D.   On: 11/04/2016 20:07   Dg Abd 1 View  Result Date: 11/08/2016 CLINICAL DATA:  Generalized abdominal pain and distension for the past 6 days without constipation or diarrhea or nausea or vomiting. EXAM: ABDOMEN - 1 VIEW COMPARISON:  KUB of November 06, 2016 FINDINGS: The colonic stool burden is moderate. There is stool in the rectum. No small or large bowel obstructive pattern is observed. There is chronic calcific density in the region of the spleen. There are surgical clips in the gallbladder fossa. There is scleroses of the vertebral bodies, portions of the pelvis, and both proximal femurs. IMPRESSION: Moderately increased colonic stool burden may reflect constipation in the appropriate clinical setting. There is no evidence bowel obstruction or ileus. Probable splenic calcification. Sclerotic foci within the lumbar spine pelvis and hips compatible with known sickle cell disease and bone infarcts. Electronically Signed   By: David  Martinique M.D.   On: 11/08/2016 13:52   Dg Abd 1 View  Result Date: 11/06/2016 CLINICAL DATA:  Constipation, abdominal distension. EXAM: ABDOMEN - 1 VIEW COMPARISON:  Abdominopelvic CT scan of November 04, 2016 FINDINGS: The colonic stool burden does not appear increased. There is a moderate amount of gas within normal caliber colon and rectum. No small bowel gas or fluid collections are observed. There surgical clips in the gallbladder fossa. There degenerative changes of the  lumbar spine. There is scleroses of the bony structures compatible with known sickle cell disease. IMPRESSION: No evidence of excessive colonic stool. No bowel obstruction or ileus. Electronically Signed   By: David  Martinique M.D.   On: 11/06/2016 11:16  Dg Chest Port 1 View  Result Date: 11/08/2016 CLINICAL DATA:  Dyspnea and wheezing EXAM: PORTABLE CHEST 1 VIEW COMPARISON:  CXR exams from 11/04/2016 and 04/08/2004 FINDINGS: Heart is top-normal in size. Bibasilar airspace opacities and atelectasis noted at the lung bases. Pneumonia is not entirely excluded. No overt pulmonary edema. The left costophrenic angle is excluded. Probable trace right pleural effusion. Sclerotic appearance both humeral heads which may reflect changes of AVN. These date back to 2005 and therefore believed unlikely to represent osteoblastic disease. IMPRESSION: 1. Borderline cardiomegaly. 2. Bibasilar airspace disease suspicious for pneumonia and/or atelectasis. 3. Sclerotic appearance of both humeral heads which may reflect chronic AVN, dating back to prior studies from 2005. Electronically Signed   By: Ashley Royalty M.D.   On: 11/08/2016 02:21   Dg Abd Acute W/chest  Result Date: 10/28/2016 CLINICAL DATA:  Generalize weakness for 3 days. The lays, body aches, hypoglycemia. EXAM: DG ABDOMEN ACUTE W/ 1V CHEST COMPARISON:  Chest 08/10/2016 FINDINGS: Heart size and pulmonary vascularity are normal. No focal airspace disease or consolidation in the lungs. No blunting of costophrenic angles. No pneumothorax. Bone sclerosis involving the shoulders likely representing bone infarcts. Scattered gas and stool throughout the colon. No small or large bowel distention. No free intra-abdominal air. No abnormal air-fluid levels. No radiopaque stones. Surgical clips in the right upper quadrant. Heterogeneous sclerotic bone changes involving the spine, sacrum, pelvis, and hips likely representing sickle cell changes. IMPRESSION: No evidence of active  pulmonary disease. Normal nonobstructive bowel gas pattern with stool-filled colon. Diffuse bony changes consistent with sickle cell. Electronically Signed   By: Lucienne Capers M.D.   On: 10/28/2016 01:54    Scheduled Meds: . amLODipine  10 mg Oral Daily  . bisacodyl  10 mg Rectal Daily  . calcium acetate  667 mg Oral TID WC  . carvedilol  12.5 mg Oral BID WC  . darbepoetin (ARANESP) injection - NON-DIALYSIS  200 mcg Subcutaneous Q Tue-1800  . enoxaparin (LOVENOX) injection  30 mg Subcutaneous Q24H  . folic acid  2 mg Oral Daily  . furosemide  40 mg Oral Daily  . gabapentin  300 mg Oral QHS  . insulin aspart  0-9 Units Subcutaneous Q4H  . insulin glargine  10 Units Subcutaneous QHS  . pantoprazole  40 mg Oral Daily  . sodium bicarbonate  650 mg Oral TID   Continuous Infusions: . sodium chloride 75 mL/hr at 11/06/16 1206  . ferumoxytol Stopped (11/08/16 1441)    Active Problems:   Sickle cell anemia (HCC)   Diabetes mellitus (HCC)   Hyperkalemia, diminished renal excretion   Hb-SS disease without crisis ([place])   Chronic kidney disease (CKD), stage IV (severe) (HCC)   Abdominal pain       In excess of 40 minutes spent during this visit. Greater than 50% involved face to face contact with the patient for assessment, counseling and coordination of care.

## 2016-11-10 ENCOUNTER — Inpatient Hospital Stay (HOSPITAL_COMMUNITY): Payer: Medicare HMO

## 2016-11-10 DIAGNOSIS — I361 Nonrheumatic tricuspid (valve) insufficiency: Secondary | ICD-10-CM

## 2016-11-10 DIAGNOSIS — R1010 Upper abdominal pain, unspecified: Secondary | ICD-10-CM

## 2016-11-10 LAB — GLUCOSE, CAPILLARY
GLUCOSE-CAPILLARY: 142 mg/dL — AB (ref 65–99)
GLUCOSE-CAPILLARY: 119 mg/dL — AB (ref 65–99)
GLUCOSE-CAPILLARY: 152 mg/dL — AB (ref 65–99)
GLUCOSE-CAPILLARY: 172 mg/dL — AB (ref 65–99)
Glucose-Capillary: 86 mg/dL (ref 65–99)
Glucose-Capillary: 86 mg/dL (ref 65–99)

## 2016-11-10 LAB — CBC WITH DIFFERENTIAL/PLATELET
Basophils Absolute: 0 10*3/uL (ref 0.0–0.1)
Basophils Relative: 0 %
EOS ABS: 0.6 10*3/uL (ref 0.0–0.7)
EOS PCT: 2 %
HCT: 21.8 % — ABNORMAL LOW (ref 39.0–52.0)
Hemoglobin: 7 g/dL — ABNORMAL LOW (ref 13.0–17.0)
LYMPHS ABS: 3 10*3/uL (ref 0.7–4.0)
Lymphocytes Relative: 10 %
MCH: 22 pg — ABNORMAL LOW (ref 26.0–34.0)
MCHC: 32.1 g/dL (ref 30.0–36.0)
MCV: 68.6 fL — AB (ref 78.0–100.0)
MONO ABS: 4.1 10*3/uL — AB (ref 0.1–1.0)
Monocytes Relative: 14 %
Neutro Abs: 21.9 10*3/uL — ABNORMAL HIGH (ref 1.7–7.7)
Neutrophils Relative %: 74 %
PLATELETS: 369 10*3/uL (ref 150–400)
RBC: 3.18 MIL/uL — ABNORMAL LOW (ref 4.22–5.81)
RDW: 22 % — AB (ref 11.5–15.5)
WBC: 29.6 10*3/uL — AB (ref 4.0–10.5)

## 2016-11-10 LAB — ECHOCARDIOGRAM COMPLETE
HEIGHTINCHES: 73 in
WEIGHTICAEL: 2974.4 [oz_av]

## 2016-11-10 LAB — CULTURE, BLOOD (ROUTINE X 2)
Culture: NO GROWTH
Culture: NO GROWTH
Special Requests: ADEQUATE
Special Requests: ADEQUATE

## 2016-11-10 LAB — RETICULOCYTES
RBC.: 3.18 MIL/uL — ABNORMAL LOW (ref 4.22–5.81)
RETIC COUNT ABSOLUTE: 254.4 10*3/uL — AB (ref 19.0–186.0)
RETIC CT PCT: 8 % — AB (ref 0.4–3.1)

## 2016-11-10 NOTE — ED Provider Notes (Signed)
[facility] DEPT Provider Note   CSN: [id number] Arrival date & time: 11/04/16  1842     History   Chief Complaint Chief Complaint  Patient presents with  . Abdominal Pain  . Chest Pain    HPI [name] is a 66 y.o. male.  HPI 66 y.o. male with a history of SCC, remote cocaine abuse, LBBB, DM, stage IV CKD who presents to the Emergency Department complaining of generalized weakness and abd pain. Patient reports 3 days of diffuse abd pain and distention.  Pain is severe, unchanging, onset on Thursday and has persisted since that time.  No dysuria, hematuria. No hx of renal stones. No hx of abd surgeries. Pt hasnt had any blood in the stool. Pt has taken constipation meds w/o any relief.  Past Medical History:  Diagnosis Date  . Diabetes mellitus   . Hypertension   . Renal insufficiency   . Sickle cell anemia (HCC)    Hgb SS disease     Patient Active Problem List   Diagnosis Date Noted  . Abdominal pain 11/04/2016  . Heart murmur 03/27/2016  . Chronic kidney disease (CKD), stage IV (severe) ([place]) 03/26/2016  . Cocaine abuse 03/25/2016  . Hb-SS disease without crisis ([place])   . Hyperkalemia, diminished renal excretion 09/03/2014  . Leukocytosis 07/28/2011  . LBBB (left bundle branch block) 07/28/2011  . Hypertension   . Diabetes mellitus ([place])     Past Surgical History:  Procedure Laterality Date  . CHOLECYSTECTOMY    . COLON SURGERY     Colon cancer surgery, removed small amt not full colectomy  . COLONOSCOPY     nclear who follows him for this  . LEG SURGERY     For ? gangrene after running into barbwire fence at 66yo       Home Medications    Prior to Admission medications   Medication Sig Start Date End Date Taking? Authorizing Provider  acetaminophen (TYLENOL) 500 MG tablet Take 500 mg by mouth every 6 (six) hours as needed for headache (pain).   Yes [provider]  amLODipine (NORVASC) 10 MG tablet Take 1 tablet (10 mg total) by  mouth daily. 06/07/16  Yes Leana Gamer, MD  calcium acetate (PHOSLO) 667 MG tablet Take 667 mg by mouth 3 (three) times daily. 09/26/16  Yes [provider]  carvedilol (COREG) 12.5 MG tablet Take 12.5 mg by mouth 2 (two) times daily. 09/18/16  Yes [provider]  Docusate Calcium (STOOL SOFTENER PO) Take 1 capsule by mouth daily as needed (constipation).   Yes [provider]  folic acid (FOLVITE) 1 MG tablet Take 1 tablet (1 mg total) by mouth daily. 04/18/13  Yes Robbie Lis, MD  furosemide (LASIX) 40 MG tablet Take 1 tablet (40 mg total) by mouth daily. 06/07/16  Yes Leana Gamer, MD  gabapentin (NEURONTIN) 300 MG capsule Take 300 mg by mouth 3 (three) times daily.   Yes [provider]  glipiZIDE (GLUCOTROL XL) 10 MG 24 hr tablet Take 10 mg by mouth daily with breakfast.    Yes [provider]  insulin aspart (NOVOLOG FLEXPEN) 100 UNIT/ML FlexPen Inject 2-6 Units into the skin 3 (three) times daily as needed for high blood sugar (CBG >150).   Yes [provider]  insulin glargine (LANTUS) 100 unit/mL SOPN Inject 20 Units into the skin at bedtime.   Yes [provider]  omeprazole (PRILOSEC) 20 MG capsule Take 20 mg by mouth  daily.    Yes [provider]  oxyCODONE-acetaminophen (PERCOCET/ROXICET) 5-325 MG tablet Take 1 tablet by mouth every 4 (four) hours as needed for moderate pain. 06/06/16  Yes Leana Gamer, MD  polyethylene glycol powder (MIRALAX) powder Take 17 g by mouth daily. Patient taking differently: Take 17 g by mouth daily as needed (constipation). Mix in 8 oz liquid and drink 10/28/16  Yes Palumbo, April, MD  saxagliptin HCl (ONGLYZA) 5 MG TABS tablet Take 5 mg by mouth daily.   Yes [provider]  Sennosides (SENNA LAXATIVE PO) Take 1 tablet by mouth daily as needed (constipation).   Yes [provider]    Family History Family History  Problem Relation Age of Onset    . Venous thrombosis Mother        Dec. of blood clot   . Diabetes type II Mother   . AAA (abdominal aortic aneurysm) Mother   . Throat cancer Father        Deceased of throat cancer    Social History Social History  Substance Use Topics  . Smoking status: Former Smoker    Packs/day: 0.50    Years: 20.00    Quit date: 06/05/1966  . Smokeless tobacco: Never Used  . Alcohol use Yes     Comment: 1-2 times per week     Allergies   Morphine and related   Review of Systems Review of Systems  Constitutional: Positive for fatigue and fever.  Gastrointestinal: Positive for abdominal distention, abdominal pain and nausea.  All other systems reviewed and are negative.    Physical Exam Updated Vital Signs BP (!) 149/67 (BP Location: Right Arm)   Pulse 81   Temp 99.1 F (37.3 C) (Oral)   Resp 18   Ht 6\' 1"  (1.854 m)   Wt 84.3 kg (185 lb 14.4 oz)   SpO2 96%   BMI 24.53 kg/m   Physical Exam  Constitutional: He is oriented to person, place, and time. He appears well-developed.  HENT:  Head: Atraumatic.  Neck: Neck supple.  Cardiovascular: Normal rate.   Murmur heard. Pulmonary/Chest: Effort normal. He has wheezes.  Abdominal: He exhibits distension. There is tenderness. There is no rebound and no guarding.  Neurological: He is alert and oriented to person, place, and time.  Skin: Skin is warm.  Nursing note and vitals reviewed.    ED Treatments / Results  Labs (all labs ordered are listed, but only abnormal results are displayed) Labs Reviewed  CBC - Abnormal; Notable for the following:       Result Value   WBC 21.5 (*)    RBC 3.20 (*)    Hemoglobin 6.7 (*)    HCT 20.7 (*)    MCV 64.7 (*)    MCH 20.9 (*)    RDW 17.4 (*)    All other components within normal limits  COMPREHENSIVE METABOLIC PANEL - Abnormal; Notable for the following:    Sodium 131 (*)    CO2 20 (*)    Glucose, Bld 174 (*)    BUN 50 (*)    Creatinine, Ser 4.63 (*)    Albumin 3.2 (*)     ALT 14 (*)    Total Bilirubin 1.3 (*)    GFR calc non Af Amer 12 (*)    GFR calc Af Amer 14 (*)    All other components within normal limits  CBC - Abnormal; Notable for the following:    WBC 23.5 (*)  RBC 3.00 (*)    Hemoglobin 6.1 (*)    HCT 19.4 (*)    MCV 64.7 (*)    MCH 20.3 (*)    RDW 16.8 (*)    All other components within normal limits  BASIC METABOLIC PANEL - Abnormal; Notable for the following:    Sodium 132 (*)    Glucose, Bld 167 (*)    BUN 51 (*)    Creatinine, Ser 4.53 (*)    Calcium 8.7 (*)    GFR calc non Af Amer 12 (*)    GFR calc Af Amer 14 (*)    All other components within normal limits  GLUCOSE, CAPILLARY - Abnormal; Notable for the following:    Glucose-Capillary 110 (*)    All other components within normal limits  GLUCOSE, CAPILLARY - Abnormal; Notable for the following:    Glucose-Capillary 125 (*)    All other components within normal limits  GLUCOSE, CAPILLARY - Abnormal; Notable for the following:    Glucose-Capillary 164 (*)    All other components within normal limits  GLUCOSE, CAPILLARY - Abnormal; Notable for the following:    Glucose-Capillary 156 (*)    All other components within normal limits  GLUCOSE, CAPILLARY - Abnormal; Notable for the following:    Glucose-Capillary 162 (*)    All other components within normal limits  GLUCOSE, CAPILLARY - Abnormal; Notable for the following:    Glucose-Capillary 117 (*)    All other components within normal limits  GLUCOSE, CAPILLARY - Abnormal; Notable for the following:    Glucose-Capillary 112 (*)    All other components within normal limits  CBC WITH DIFFERENTIAL/PLATELET - Abnormal; Notable for the following:    WBC 24.5 (*)    RBC 2.59 (*)    Hemoglobin 5.5 (*)    HCT 16.7 (*)    MCV 64.5 (*)    MCH 21.2 (*)    RDW 18.4 (*)    Platelets 149 (*)    nRBC 3 (*)    Neutro Abs 16.2 (*)    Monocytes Absolute 4.4 (*)    All other components within normal limits  RETICULOCYTES -  Abnormal; Notable for the following:    Retic Ct Pct 8.1 (*)    RBC. 2.59 (*)    Retic Count, Manual 209.8 (*)    All other components within normal limits  RENAL FUNCTION PANEL - Abnormal; Notable for the following:    Potassium 5.5 (*)    CO2 18 (*)    Glucose, Bld 141 (*)    BUN 52 (*)    Creatinine, Ser 4.99 (*)    Albumin 3.2 (*)    GFR calc non Af Amer 11 (*)    GFR calc Af Amer 12 (*)    All other components within normal limits  HEPATIC FUNCTION PANEL - Abnormal; Notable for the following:    Albumin 3.0 (*)    ALT 15 (*)    Total Bilirubin 1.8 (*)    Indirect Bilirubin 1.4 (*)    All other components within normal limits  EPSTEIN-BARR VIRUS VCA ANTIBODY PANEL - Abnormal; Notable for the following:    EBV VCA IgG 171.0 (*)    EBV NA IgG >600.0 (*)    All other components within normal limits  GLUCOSE, CAPILLARY - Abnormal; Notable for the following:    Glucose-Capillary 159 (*)    All other components within normal limits  GLUCOSE, CAPILLARY - Abnormal; Notable for the following:  Glucose-Capillary 137 (*)    All other components within normal limits  GLUCOSE, CAPILLARY - Abnormal; Notable for the following:    Glucose-Capillary 137 (*)    All other components within normal limits  GLUCOSE, CAPILLARY - Abnormal; Notable for the following:    Glucose-Capillary 104 (*)    All other components within normal limits  RENAL FUNCTION PANEL - Abnormal; Notable for the following:    Sodium 133 (*)    Potassium 5.2 (*)    CO2 21 (*)    Glucose, Bld 161 (*)    BUN 58 (*)    Creatinine, Ser 5.41 (*)    Calcium 8.8 (*)    Albumin 2.7 (*)    GFR calc non Af Amer 10 (*)    GFR calc Af Amer 11 (*)    All other components within normal limits  CBC WITH DIFFERENTIAL/PLATELET - Abnormal; Notable for the following:    WBC 22.1 (*)    RBC 2.21 (*)    Hemoglobin 4.6 (*)    HCT 14.1 (*)    MCV 63.8 (*)    MCH 20.8 (*)    RDW 18.7 (*)    Neutro Abs 14.4 (*)    Monocytes  Absolute 4.0 (*)    All other components within normal limits  GLUCOSE, CAPILLARY - Abnormal; Notable for the following:    Glucose-Capillary 159 (*)    All other components within normal limits  RETICULOCYTES - Abnormal; Notable for the following:    Retic Ct Pct 8.3 (*)    RBC. 2.20 (*)    All other components within normal limits  IRON AND TIBC - Abnormal; Notable for the following:    Iron 23 (*)    TIBC 195 (*)    Saturation Ratios 12 (*)    All other components within normal limits  FOLATE RBC - Abnormal; Notable for the following:    Hematocrit 14.3 (*)    All other components within normal limits  ERYTHROPOIETIN - Abnormal; Notable for the following:    Erythropoietin 211.1 (*)    All other components within normal limits  GLUCOSE, CAPILLARY - Abnormal; Notable for the following:    Glucose-Capillary 179 (*)    All other components within normal limits  RENAL FUNCTION PANEL - Abnormal; Notable for the following:    Sodium 132 (*)    Potassium 5.5 (*)    CO2 19 (*)    Glucose, Bld 142 (*)    BUN 58 (*)    Creatinine, Ser 5.26 (*)    Albumin 2.9 (*)    GFR calc non Af Amer 10 (*)    GFR calc Af Amer 12 (*)    All other components within normal limits  CBC - Abnormal; Notable for the following:    WBC 23.6 (*)    RBC 2.80 (*)    Hemoglobin 5.8 (*)    HCT 18.2 (*)    MCV 65.0 (*)    MCH 20.7 (*)    RDW 20.7 (*)    All other components within normal limits  IRON AND TIBC - Abnormal; Notable for the following:    Iron 16 (*)    TIBC 207 (*)    Saturation Ratios 8 (*)    All other components within normal limits  FERRITIN - Abnormal; Notable for the following:    Ferritin 451 (*)    All other components within normal limits  RETICULOCYTES - Abnormal; Notable for the following:  Retic Ct Pct 6.1 (*)    RBC. 2.80 (*)    All other components within normal limits  GLUCOSE, CAPILLARY - Abnormal; Notable for the following:    Glucose-Capillary 180 (*)    All  other components within normal limits  TROPONIN I - Abnormal; Notable for the following:    Troponin I 0.03 (*)    All other components within normal limits  TROPONIN I - Abnormal; Notable for the following:    Troponin I 0.05 (*)    All other components within normal limits  GLUCOSE, CAPILLARY - Abnormal; Notable for the following:    Glucose-Capillary 121 (*)    All other components within normal limits  GLUCOSE, CAPILLARY - Abnormal; Notable for the following:    Glucose-Capillary 117 (*)    All other components within normal limits  DIFFERENTIAL - Abnormal; Notable for the following:    Neutro Abs 17.6 (*)    Monocytes Absolute 4.1 (*)    All other components within normal limits  GLUCOSE, CAPILLARY - Abnormal; Notable for the following:    Glucose-Capillary 115 (*)    All other components within normal limits  GLUCOSE, CAPILLARY - Abnormal; Notable for the following:    Glucose-Capillary 163 (*)    All other components within normal limits  RENAL FUNCTION PANEL - Abnormal; Notable for the following:    CO2 19 (*)    BUN 57 (*)    Creatinine, Ser 5.34 (*)    Albumin 3.2 (*)    GFR calc non Af Amer 10 (*)    GFR calc Af Amer 12 (*)    All other components within normal limits  CBC WITH DIFFERENTIAL/PLATELET - Abnormal; Notable for the following:    WBC 21.6 (*)    RBC 3.44 (*)    Hemoglobin 7.4 (*)    HCT 23.2 (*)    MCV 67.4 (*)    MCH 21.5 (*)    RDW 22.4 (*)    Neutro Abs 15.6 (*)    Monocytes Absolute 2.8 (*)    All other components within normal limits  RETICULOCYTES - Abnormal; Notable for the following:    Retic Ct Pct 6.6 (*)    RBC. 3.44 (*)    Retic Count, Manual 227.0 (*)    All other components within normal limits  GLUCOSE, CAPILLARY - Abnormal; Notable for the following:    Glucose-Capillary 156 (*)    All other components within normal limits  GLUCOSE, CAPILLARY - Abnormal; Notable for the following:    Glucose-Capillary 106 (*)    All other  components within normal limits  GLUCOSE, CAPILLARY - Abnormal; Notable for the following:    Glucose-Capillary 130 (*)    All other components within normal limits  CBC WITH DIFFERENTIAL/PLATELET - Abnormal; Notable for the following:    WBC 29.6 (*)    RBC 3.18 (*)    Hemoglobin 7.0 (*)    HCT 21.8 (*)    MCV 68.6 (*)    MCH 22.0 (*)    RDW 22.0 (*)    Neutro Abs 21.9 (*)    Monocytes Absolute 4.1 (*)    All other components within normal limits  RETICULOCYTES - Abnormal; Notable for the following:    Retic Ct Pct 8.0 (*)    RBC. 3.18 (*)    Retic Count, Manual 254.4 (*)    All other components within normal limits  GLUCOSE, CAPILLARY - Abnormal; Notable for the following:    Glucose-Capillary 133 (*)  All other components within normal limits  GLUCOSE, CAPILLARY - Abnormal; Notable for the following:    Glucose-Capillary 142 (*)    All other components within normal limits  GLUCOSE, CAPILLARY - Abnormal; Notable for the following:    Glucose-Capillary 119 (*)    All other components within normal limits  GLUCOSE, CAPILLARY - Abnormal; Notable for the following:    Glucose-Capillary 152 (*)    All other components within normal limits  CULTURE, BLOOD (ROUTINE X 2)  CULTURE, BLOOD (ROUTINE X 2)  LIPASE, BLOOD  URINALYSIS, ROUTINE W REFLEX MICROSCOPIC  CMV IGM  PATHOLOGIST SMEAR REVIEW  VITAMIN B12  GLUCOSE, CAPILLARY  GLUCOSE, CAPILLARY  GLUCOSE, CAPILLARY  GLUCOSE, CAPILLARY  I-STAT TROPOININ, ED  I-STAT CG4 LACTIC ACID, ED  I-STAT CG4 LACTIC ACID, ED  I-STAT TROPOININ, ED  TYPE AND SCREEN  PREPARE RBC (CROSSMATCH)  PREPARE RBC (CROSSMATCH)    EKG  EKG Interpretation  Date/Time:  Saturday November 04 2016 20:57:08 EDT Ventricular Rate:  77 PR Interval:    QRS Duration: 154 QT Interval:  360 QTC Calculation: 408 R Axis:   20 Text Interpretation:  Sinus rhythm IVCD, consider atypical LBBB V1-v3 ST elevation is more pronounced than before v4 ST elevation is  new Lateral depression of ST segment is not new Confirmed by Varney Biles [phone]) on 11/04/2016 9:02:40 PM       Radiology No results found.  Procedures Procedures (including critical care time)  Medications Ordered in ED Medications  carvedilol (COREG) tablet 12.5 mg (12.5 mg Oral Given 11/10/16 1736)  polyethylene glycol (MIRALAX / GLYCOLAX) packet 17 g (not administered)  pantoprazole (PROTONIX) EC tablet 40 mg (40 mg Oral Given 11/10/16 1002)  amLODipine (NORVASC) tablet 10 mg (10 mg Oral Given 11/10/16 1002)  calcium acetate (PHOSLO) capsule 667 mg (667 mg Oral Given 11/10/16 1729)  insulin glargine (LANTUS) injection 10 Units (10 Units Subcutaneous Given 11/09/16 2132)  0.9 %  sodium chloride infusion ( Intravenous New Bag/Given 11/06/16 1206)  enoxaparin (LOVENOX) injection 30 mg (30 mg Subcutaneous Given 11/10/16 1258)  ondansetron (ZOFRAN) tablet 4 mg (not administered)    Or  ondansetron (ZOFRAN) injection 4 mg (not administered)  insulin aspart (novoLOG) injection 0-9 Units (2 Units Subcutaneous Given 12/09/14 0737)  folic acid (FOLVITE) tablet 2 mg (2 mg Oral Given 11/10/16 1010)  Darbepoetin Alfa (ARANESP) injection 200 mcg (200 mcg Subcutaneous Given by Other 11/07/16 1853)  albuterol (PROVENTIL) (2.5 MG/3ML) 0.083% nebulizer solution 2.5 mg (2.5 mg Nebulization Given 11/08/16 0209)  sodium bicarbonate tablet 650 mg (650 mg Oral Given 11/10/16 1729)  ferumoxytol (FERAHEME) 510 mg in sodium chloride 0.9 % 100 mL IVPB (0 mg Intravenous Stopped 11/08/16 1441)  acetaminophen (TYLENOL) tablet 500 mg (500 mg Oral Given 11/09/16 0531)  gabapentin (NEURONTIN) capsule 300 mg (300 mg Oral Given 11/09/16 2132)  furosemide (LASIX) tablet 40 mg (40 mg Oral Given 11/10/16 1005)  oxyCODONE (Oxy IR/ROXICODONE) immediate release tablet 10 mg (10 mg Oral Given 11/10/16 1729)  senna-docusate (Senokot-S) tablet 1 tablet (1 tablet Oral Given 11/10/16 1003)  acetaminophen (TYLENOL) tablet 650 mg (650 mg Oral Given 11/04/16  1915)  piperacillin-tazobactam (ZOSYN) IVPB 3.375 g (0 g Intravenous Stopped 11/04/16 2330)  vancomycin (VANCOCIN) 1,500 mg in sodium chloride 0.9 % 500 mL IVPB (0 mg Intravenous Stopped 11/05/16 0100)  0.9 %  sodium chloride infusion ( Intravenous New Bag/Given 11/07/16 1520)  0.9 %  sodium chloride infusion (10 mLs Intravenous New Bag/Given 11/08/16 1818)  furosemide (LASIX)  injection 20 mg (20 mg Intravenous Given 11/08/16 1818)  sorbitol, milk of mag, mineral oil, glycerin (SMOG) enema (960 mLs Rectal Given 11/08/16 2316)     Initial Impression / Assessment and Plan / ED Course  I have reviewed the triage vital signs and the nursing notes.  Pertinent labs & imaging results that were available during my care of the patient were reviewed by me and considered in my medical decision making (see chart for details).     Pt comes in with fevers, abd pain and distention. He also had some wheezing on my exam. Pt is immunocompromised due to his SCA, DM.  He is noted to be anemic. CT abdomen - pelvis w/o contrast ordered. IVAB given. Medicine to admit.  DDX: SBO, diverticulitis, mesenteric ischemia also in the ddx.  Final Clinical Impressions(s) / ED Diagnoses   Final diagnoses:  Abdominal distension  Abdominal pain  Wheezing  SOB (shortness of breath)    New Prescriptions Current Discharge Medication List       Varney Biles, MD 11/10/16 1843

## 2016-11-10 NOTE — Progress Notes (Signed)
Pt refused bed alarm.  Eleanora Neighbor, RN

## 2016-11-10 NOTE — Progress Notes (Signed)
Subjective: This is a 66 year old gentleman with history of sickle cell disease and chronic kidney disease stage IV admitted with hypoxemia fever and abdominal pain. Patient is constipated. He did get an enema this morning with little response. His hypoxemia is improving. Still has low-grade temperature and abdominal distention. He is being followed by nephrology. Overall he has shown improvement. Echocardiogram showed a normal EF of 60-65%. He's had significant leukocytosis with a white count of almost 30,000. He overall feels much better. He is not in significant pain and not on IV medication. Patient is also diabetic and doing much better. Blood pressure is well controlled.  Objective: Vital signs in last 24 hours: Temp:  [98.8 F (37.1 C)-99.4 F (37.4 C)] 99.2 F (37.3 C) (06/08 2030) Pulse Rate:  [78-81] 78 (06/08 2030) Resp:  [16-18] 16 (06/08 2030) BP: (120-149)/(56-86) 120/67 (06/08 2030) SpO2:  [90 %-96 %] 92 % (06/08 2030) Weight:  [83.9 kg (185 lb)-84.3 kg (185 lb 14.4 oz)] 83.9 kg (185 lb) (06/08 2140) Weight change:  Last BM Date: 11/09/16  Intake/Output from previous day: 06/07 0701 - 06/08 0700 In: 680 [P.O.:680] Out: 1225 [Urine:1225] Intake/Output this shift: No intake/output data recorded.  General appearance: alert, cooperative and no distress Neck: no adenopathy, no carotid bruit, no JVD, supple, symmetrical, trachea midline and thyroid not enlarged, symmetric, no tenderness/mass/nodules Back: symmetric, no curvature. ROM normal. No CVA tenderness. Resp: clear to auscultation bilaterally Cardio: regular rate and rhythm, S1, S2 normal, no murmur, click, rub or gallop GI: soft, non-tender; bowel sounds normal; no masses,  no organomegaly Extremities: extremities normal, atraumatic, no cyanosis or edema Pulses: 2+ and symmetric Skin: Skin color, texture, turgor normal. No rashes or lesions Neurologic: Grossly normal  Lab Results:  Recent Labs  11/09/16 0430  11/10/16 0435  WBC 21.6* 29.6*  HGB 7.4* 7.0*  HCT 23.2* 21.8*  PLT 362 369   BMET  Recent Labs  11/08/16 0539 11/09/16 0430  NA 132* 135  K 5.5* 4.9  CL 101 103  CO2 19* 19*  GLUCOSE 142* 92  BUN 58* 57*  CREATININE 5.26* 5.34*  CALCIUM 9.0 9.6    Studies/Results: No results found.  Medications: I have reviewed the patient's current medications.  Assessment/Plan: A 66 year old gentleman admitted with hypoxemia and abdominal pain with history of sickle cell disease and chronic kidney disease stage IV.  #1 hypoxemia: This seems to be improving significantly. Patient's main problem is abdominal in nature. We'll mobilize patient and see how he does with oxygenation.  #2 chronic kidney this stage IV: We will continue care according to nephrology. No need for immediate hemodialysis  #3 hyperkalemia: Potassium has corrected. Following lab results closely.  #4 hyponatremia: This has also resolved.  #5 leukocytosis: Patient had fever on admission. White count continues to increase with no evidence of source of infection. Not on any antibiotics at the moment. We will consider infectious disease consult if this continues to look for source of infection.  #6 constipation: Patient has had enema this morning. We'll await for any response.  #7 diabetes: Blood sugar is well controlled on current regimen. Continue sliding scale insulin on current treatment  #8 hypertension: Blood pressure is also controlled on amlodipine and Coreg. He is also on Lasix.  LOS: 6 days   Yalexa Blust,LAWAL 11/10/2016, 10:06 PM

## 2016-11-11 LAB — BPAM RBC
BLOOD PRODUCT EXPIRATION DATE: 201806202359
BLOOD PRODUCT EXPIRATION DATE: 201806202359
Blood Product Expiration Date: 201806292359
ISSUE DATE / TIME: 201806051837
ISSUE DATE / TIME: 201806061840
UNIT TYPE AND RH: 8400
Unit Type and Rh: 8400
Unit Type and Rh: 8400

## 2016-11-11 LAB — TYPE AND SCREEN
ABO/RH(D): AB POS
Antibody Screen: NEGATIVE
UNIT DIVISION: 0
Unit division: 0
Unit division: 0

## 2016-11-11 LAB — COMPREHENSIVE METABOLIC PANEL
ALBUMIN: 2.6 g/dL — AB (ref 3.5–5.0)
ALT: 11 U/L — AB (ref 17–63)
AST: 19 U/L (ref 15–41)
Alkaline Phosphatase: 72 U/L (ref 38–126)
Anion gap: 9 (ref 5–15)
BUN: 44 mg/dL — AB (ref 6–20)
CHLORIDE: 104 mmol/L (ref 101–111)
CO2: 23 mmol/L (ref 22–32)
Calcium: 9.1 mg/dL (ref 8.9–10.3)
Creatinine, Ser: 4.36 mg/dL — ABNORMAL HIGH (ref 0.61–1.24)
GFR calc Af Amer: 15 mL/min — ABNORMAL LOW (ref >60)
GFR calc non Af Amer: 13 mL/min — ABNORMAL LOW (ref >60)
GLUCOSE: 132 mg/dL — AB (ref 65–99)
Potassium: 4.3 mmol/L (ref 3.5–5.1)
SODIUM: 136 mmol/L (ref 135–145)
Total Bilirubin: 1.2 mg/dL (ref 0.3–1.2)
Total Protein: 6.6 g/dL (ref 6.5–8.1)

## 2016-11-11 LAB — CBC WITH DIFFERENTIAL/PLATELET
Basophils Absolute: 0.3 10*3/uL — ABNORMAL HIGH (ref 0.0–0.1)
Basophils Relative: 1 %
EOS PCT: 3 %
Eosinophils Absolute: 0.8 10*3/uL — ABNORMAL HIGH (ref 0.0–0.7)
HEMATOCRIT: 23.2 % — AB (ref 39.0–52.0)
HEMOGLOBIN: 7.3 g/dL — AB (ref 13.0–17.0)
LYMPHS PCT: 16 %
Lymphs Abs: 4.5 10*3/uL — ABNORMAL HIGH (ref 0.7–4.0)
MCH: 22.5 pg — AB (ref 26.0–34.0)
MCHC: 31.5 g/dL (ref 30.0–36.0)
MCV: 71.4 fL — ABNORMAL LOW (ref 78.0–100.0)
MONOS PCT: 6 %
Monocytes Absolute: 1.7 10*3/uL — ABNORMAL HIGH (ref 0.1–1.0)
NEUTROS ABS: 20.8 10*3/uL — AB (ref 1.7–7.7)
Neutrophils Relative %: 74 %
Platelets: 378 10*3/uL (ref 150–400)
RBC: 3.25 MIL/uL — AB (ref 4.22–5.81)
RDW: 23.6 % — ABNORMAL HIGH (ref 11.5–15.5)
WBC: 28.1 10*3/uL — AB (ref 4.0–10.5)
nRBC: 6 /100 WBC — ABNORMAL HIGH

## 2016-11-11 LAB — GLUCOSE, CAPILLARY
GLUCOSE-CAPILLARY: 134 mg/dL — AB (ref 65–99)
GLUCOSE-CAPILLARY: 147 mg/dL — AB (ref 65–99)
GLUCOSE-CAPILLARY: 160 mg/dL — AB (ref 65–99)
Glucose-Capillary: 116 mg/dL — ABNORMAL HIGH (ref 65–99)

## 2016-11-11 MED ORDER — INSULIN ASPART 100 UNIT/ML ~~LOC~~ SOLN
0.0000 [IU] | Freq: Three times a day (TID) | SUBCUTANEOUS | Status: DC
  Administered 2016-11-12: 1 [IU] via SUBCUTANEOUS

## 2016-11-11 MED ORDER — INSULIN ASPART 100 UNIT/ML ~~LOC~~ SOLN: 0.0000 [IU] | Freq: Every day | SUBCUTANEOUS | Status: DC

## 2016-11-11 NOTE — Progress Notes (Signed)
Spoke with on call MD regarding conflicting insulin orders. Pt is supposed to be ACHS and no HS coverage.   Eleanora Neighbor, RN

## 2016-11-11 NOTE — Progress Notes (Signed)
Patient reported  2 watery BMs this afternoon.  This was after receiving miralax & senna.  Patient declining tap water enema at this time.  Jillyn Ledger, MBA, BSN, RN

## 2016-11-11 NOTE — Progress Notes (Signed)
Subjective: Patient is doing much better today. He did have a bowel movement today. Renal function stays stable however white count is still elevated. He is still feeling weak and tired. Pain is not an issue at the moment. No fever or chills. Abdomen remains distended.  Objective: Vital signs in last 24 hours: Temp:  [98.4 F (36.9 C)-99.2 F (37.3 C)] 98.4 F (36.9 C) (06/09 0532) Pulse Rate:  [73-81] 73 (06/09 0532) Resp:  [16-18] 16 (06/09 0532) BP: (120-149)/(66-67) 144/66 (06/09 0532) SpO2:  [92 %-96 %] 94 % (06/09 0532) Weight:  [83.9 kg (184 lb 15.5 oz)-84.3 kg (185 lb 14.4 oz)] 83.9 kg (184 lb 15.5 oz) (06/09 0500) Weight change: -1.86 kg (-4 lb 1.6 oz) Last BM Date: 11/09/16  Intake/Output from previous day: 06/08 0701 - 06/09 0700 In: 780 [P.O.:780] Out: 1600 [Urine:1600] Intake/Output this shift: No intake/output data recorded.  General appearance: alert, cooperative and no distress Neck: no adenopathy, no carotid bruit, no JVD, supple, symmetrical, trachea midline and thyroid not enlarged, symmetric, no tenderness/mass/nodules Back: symmetric, no curvature. ROM normal. No CVA tenderness. Resp: clear to auscultation bilaterally Cardio: regular rate and rhythm, S1, S2 normal, no murmur, click, rub or gallop GI: soft, non-tender; bowel sounds normal; no masses,  no organomegaly Extremities: extremities normal, atraumatic, no cyanosis or edema Pulses: 2+ and symmetric Skin: Skin color, texture, turgor normal. No rashes or lesions Neurologic: Grossly normal  Lab Results:  Recent Labs  11/09/16 0430 11/10/16 0435  WBC 21.6* 29.6*  HGB 7.4* 7.0*  HCT 23.2* 21.8*  PLT 362 369   BMET  Recent Labs  11/09/16 0430  NA 135  K 4.9  CL 103  CO2 19*  GLUCOSE 92  BUN 57*  CREATININE 5.34*  CALCIUM 9.6    Studies/Results: No results found.  Medications: I have reviewed the patient's current medications.  Assessment/Plan: A 66 year old gentleman admitted  with hypoxemia and abdominal pain with history of sickle cell disease and chronic kidney disease stage IV.  #1 hypoxemia: patient's oxygen demand is much better today. He still has low grade temperature. Continue to titrate off oxygen. He is 94% at the moment" probably come off oxygen completely.  #2 chronic kidney this stage IV: Continue care according to nephrology. No need for immediate hemodialysis  #3 hyperkalemia: This has resolved. Continue monitoring  #4 hyponatremia: This has also resolved.  #5 leukocytosis: white count increased yesterday. Recheck today  #6 constipation: patient has had one bowel movement so far today. Continue to monitor  #7 diabetes: Blood sugar is well controlled on current regimen. Continue sliding scale insulin on current treatment  #8 hypertension: Blood pressure is also controlled on amlodipine and Coreg. He is also on Lasix.  LOS: 7 days   GARBA,LAWAL 11/11/2016, 8:07 AM

## 2016-11-12 LAB — GLUCOSE, CAPILLARY
Glucose-Capillary: 80 mg/dL (ref 65–99)
Glucose-Capillary: 141 mg/dL — ABNORMAL HIGH (ref 65–99)

## 2016-11-12 NOTE — Discharge Summary (Signed)
Physician Discharge Summary  Patient ID: [name] MRN: [id number] DOB/AGE: 1950-11-28 66 y.o.  Admit date: 11/04/2016 Discharge date: 11/12/2016  Admission Diagnoses:  Discharge Diagnoses:  Active Problems:   Diabetes mellitus (HCC)   Hyperkalemia, diminished renal excretion   Hb-SS disease without crisis ([place])   Chronic kidney disease (CKD), stage IV (severe) (HCC)   Abdominal pain   Discharged Condition: good  Hospital Course: patient is a 66 year old gentleman with known history of sickle cell disease as well as diabetes hypertension chronic kidney disease stage IV who came in to the hospital with diffuse abdominal pain and distention. Patient was seen and evaluated and diagnosed with significant constipation. He had low-grade temperature as well as significant leukocytosis. There was no evidence of colitis. He was admitted and treated empirically. Lactic acid was found to be normal. Nephrology followed the patient. No need for hemodialysis however patient was continuously weak and constipated. He required multiple enemas. He ultimately improved to where he could be discharged home. His blood sugar was also managed using sliding scale insulin and home regimen. At the time of discharge she was back to baseline. Patient did not also have significant sickle cell crisis. He was on pain medication mainly at the minimum.  Consults: nephrology  Significant Diagnostic Studies: labs: multiple CBCs and CMP as well as checked. Patient had chronic kidney disease that was all at baseline.  Treatments: IV hydration and analgesia: acetaminophen and Dilaudid  Discharge Exam: Blood pressure (!) 148/82, pulse 67, temperature 98.7 F (37.1 C), temperature source Oral, resp. rate 16, height 6\' 1"  (1.854 m), weight 84.4 kg (186 lb 1.1 oz), SpO2 94 %. General appearance: alert, cooperative and no distress Head: Normocephalic, without obvious abnormality, atraumatic Resp: clear to auscultation  bilaterally Chest wall: no tenderness Cardio: regular rate and rhythm, S1, S2 normal, no murmur, click, rub or gallop GI: soft, non-tender; bowel sounds normal; no masses,  no organomegaly Extremities: extremities normal, atraumatic, no cyanosis or edema Pulses: 2+ and symmetric Skin: Skin color, texture, turgor normal. No rashes or lesions Neurologic: Grossly normal  Disposition: 01-Home or Self Care   Allergies as of 11/12/2016      Reactions   Morphine And Related Itching      Medication List    TAKE these medications   acetaminophen 500 MG tablet Commonly known as:  TYLENOL Take 500 mg by mouth every 6 (six) hours as needed for headache (pain).   amLODipine 10 MG tablet Commonly known as:  NORVASC Take 1 tablet (10 mg total) by mouth daily.   calcium acetate 667 MG tablet Commonly known as:  PHOSLO Take 667 mg by mouth 3 (three) times daily.   carvedilol 12.5 MG tablet Commonly known as:  COREG Take 12.5 mg by mouth 2 (two) times daily.   folic acid 1 MG tablet Commonly known as:  FOLVITE Take 1 tablet (1 mg total) by mouth daily.   furosemide 40 MG tablet Commonly known as:  LASIX Take 1 tablet (40 mg total) by mouth daily.   gabapentin 300 MG capsule Commonly known as:  NEURONTIN Take 300 mg by mouth 3 (three) times daily.   glipiZIDE 10 MG 24 hr tablet Commonly known as:  GLUCOTROL XL Take 10 mg by mouth daily with breakfast.   insulin glargine 100 unit/mL Sopn Commonly known as:  LANTUS Inject 20 Units into the skin at bedtime.   NOVOLOG FLEXPEN 100 UNIT/ML FlexPen Generic drug:  insulin aspart Inject 2-6 Units into the skin  3 (three) times daily as needed for high blood sugar (CBG >150).   omeprazole 20 MG capsule Commonly known as:  PRILOSEC Take 20 mg by mouth daily.   ONGLYZA 5 MG Tabs tablet Generic drug:  saxagliptin HCl Take 5 mg by mouth daily.   oxyCODONE-acetaminophen 5-325 MG tablet Commonly known as:  PERCOCET/ROXICET Take 1  tablet by mouth every 4 (four) hours as needed for moderate pain.   polyethylene glycol powder powder Commonly known as:  MIRALAX Take 17 g by mouth daily. What changed:  when to take this  reasons to take this  additional instructions   SENNA LAXATIVE PO Take 1 tablet by mouth daily as needed (constipation).   STOOL SOFTENER PO Take 1 capsule by mouth daily as needed (constipation).        SignedBarbette Merino 11/12/2016, 2:19 PM  Time spent 34 minutes

## 2016-11-12 NOTE — Progress Notes (Signed)
[name] to be D/C'd Home per MD order.  Discussed prescriptions and follow up appointments with the patient. Prescriptions given to patient, medication list explained in detail. Pt verbalized understanding.  Allergies as of 11/12/2016      Reactions   Morphine And Related Itching      Medication List    TAKE these medications   acetaminophen 500 MG tablet Commonly known as:  TYLENOL Take 500 mg by mouth every 6 (six) hours as needed for headache (pain).   amLODipine 10 MG tablet Commonly known as:  NORVASC Take 1 tablet (10 mg total) by mouth daily.   calcium acetate 667 MG tablet Commonly known as:  PHOSLO Take 667 mg by mouth 3 (three) times daily.   carvedilol 12.5 MG tablet Commonly known as:  COREG Take 12.5 mg by mouth 2 (two) times daily.   folic acid 1 MG tablet Commonly known as:  FOLVITE Take 1 tablet (1 mg total) by mouth daily.   furosemide 40 MG tablet Commonly known as:  LASIX Take 1 tablet (40 mg total) by mouth daily.   gabapentin 300 MG capsule Commonly known as:  NEURONTIN Take 300 mg by mouth 3 (three) times daily.   glipiZIDE 10 MG 24 hr tablet Commonly known as:  GLUCOTROL XL Take 10 mg by mouth daily with breakfast.   insulin glargine 100 unit/mL Sopn Commonly known as:  LANTUS Inject 20 Units into the skin at bedtime.   NOVOLOG FLEXPEN 100 UNIT/ML FlexPen Generic drug:  insulin aspart Inject 2-6 Units into the skin 3 (three) times daily as needed for high blood sugar (CBG >150).   omeprazole 20 MG capsule Commonly known as:  PRILOSEC Take 20 mg by mouth daily.   ONGLYZA 5 MG Tabs tablet Generic drug:  saxagliptin HCl Take 5 mg by mouth daily.   oxyCODONE-acetaminophen 5-325 MG tablet Commonly known as:  PERCOCET/ROXICET Take 1 tablet by mouth every 4 (four) hours as needed for moderate pain.   polyethylene glycol powder powder Commonly known as:  MIRALAX Take 17 g by mouth daily. What changed:  when to take  this  reasons to take this  additional instructions   SENNA LAXATIVE PO Take 1 tablet by mouth daily as needed (constipation).   STOOL SOFTENER PO Take 1 capsule by mouth daily as needed (constipation).       Vitals:   11/12/16 0410 11/12/16 0923  BP: (!) 151/76 (!) 148/82  Pulse: 68 67  Resp: 17 16  Temp: 97.5 F (36.4 C) 98.7 F (37.1 C)    Skin clean, dry and intact without evidence of skin break down, no evidence of skin tears noted. IV catheter discontinued intact. Site without signs and symptoms of complications. Dressing and pressure applied. Pt denies pain at this time. No complaints noted.  An After Visit Summary was printed and given to the patient. Patient escorted via [place], and D/C home via private auto.  Haywood Lasso BSN, RN [facility] Phone [phone]

## 2016-11-13 DIAGNOSIS — R140 Abdominal distension (gaseous): Secondary | ICD-10-CM

## 2016-11-13 LAB — CBC WITH DIFFERENTIAL/PLATELET
BAND NEUTROPHILS: 0 %
BASOS ABS: 0 10*3/uL (ref 0.0–0.1)
Basophils Relative: 0 %
Blasts: 0 %
EOS ABS: 0.2 10*3/uL (ref 0.0–0.7)
EOS PCT: 1 %
HCT: 16.7 % — ABNORMAL LOW (ref 39.0–52.0)
Hemoglobin: 5.5 g/dL — CL (ref 13.0–17.0)
LYMPHS ABS: 3.7 10*3/uL (ref 0.7–4.0)
Lymphocytes Relative: 15 %
MCH: 21.2 pg — ABNORMAL LOW (ref 26.0–34.0)
MCHC: 32.9 g/dL (ref 30.0–36.0)
MCV: 64.5 fL — ABNORMAL LOW (ref 78.0–100.0)
METAMYELOCYTES PCT: 0 %
MONO ABS: 4.4 10*3/uL — AB (ref 0.1–1.0)
Monocytes Relative: 18 %
Myelocytes: 0 %
NEUTROS ABS: 16.2 10*3/uL — AB (ref 1.7–7.7)
Neutrophils Relative %: 66 %
Other: 0 %
PLATELETS: 149 10*3/uL — AB (ref 150–400)
Promyelocytes Absolute: 0 %
RBC: 2.59 MIL/uL — ABNORMAL LOW (ref 4.22–5.81)
RDW: 18.4 % — AB (ref 11.5–15.5)
WBC: 24.5 10*3/uL — ABNORMAL HIGH (ref 4.0–10.5)
nRBC: 3 /100 WBC — ABNORMAL HIGH

## 2016-11-17 ENCOUNTER — Other Ambulatory Visit: Payer: Self-pay

## 2016-11-21 ENCOUNTER — Telehealth: Payer: Self-pay

## 2016-11-21 ENCOUNTER — Encounter (HOSPITAL_COMMUNITY): Payer: Self-pay | Admitting: *Deleted

## 2016-11-21 ENCOUNTER — Other Ambulatory Visit: Payer: Self-pay

## 2016-11-21 MED ORDER — CEFUROXIME SODIUM 1.5 G IV SOLR
1.5000 g | INTRAVENOUS | Status: AC
  Administered 2016-11-22: 1.5 g via INTRAVENOUS
  Filled 2016-11-21: qty 1.5

## 2016-11-21 NOTE — Progress Notes (Signed)
Anesthesia Chart Review: SAME DAY WORK-UP.  Patient is a 66 year old male scheduled for insertion of left FA vs LUE AVGG on 11/22/16 by Dr. Trula Slade.  History includes former smoker (quit '68), Sickle cell anemia (Hgb SS disease), DM2, CKD IV-V, HTN, colon surgery, cholecystectomy, leg surgery (from gangrene after barbwire injury, age 26). History cocaine use (last UDS + 03/25/16), he reported last cocaine use 04/2016. - Last admission 11/04/16-11/12/16 for abdominal pain, leukocytosis (30K), anemia, CKD stage V (nearly need for HD). He was treated for constipation, transfused 2 Units PRBC. Nephrology recommended patient get HD vascular access once GI/infections issues sorted out. There was question whether he may have mesenteric ischemia associated with Sickle Cell disease. Blood cultures negative. CT abd/pelvic CT unremarkable. 11/04/16 CXR was negative, but 6/60/18 CXR showed bibasilar airspace disease suspicious for pneumonia and/or atelectasis. WBC still elevated (28.1K) on 11/11/16 and H/H 7.3/23.2 (had been as low as 4.6/14.1 during admission). Troponin 0.02--0.03-0.05 (thought to be demand ischemia by hospitalist). 10/28/16 UDS negative.  - PCP is Dr. Nolene Ebbs. - Nephrologist is Dr. Donato Heinz. - He last saw a cardiologist (Dr. Debara Pickett) during 03/2016 admission for chest pain. Seen stress/echo reports below.  Meds include amlodipine, Phoslo, Coreg, folic acid, Lasix, Neurontin, glipizide, Novolog, Lantus, Prilosec, Onglyza.   EKG 11/08/16: NSR, left BBB. (New left BBB diagnosed 09/2008 in the setting of chest pain and recent cocaine use. He was seen by cardiologist Dr. Phill Mutter. Troponin negative. 09/10/08 echo showed no wall motion abnormalities. Chest pain was felt due to recent cocaine use.)  Echo 11/10/16: Study Conclusions - Left ventricle: The cavity size was normal. Wall thickness was   normal. Systolic function was normal. The estimated ejection   fraction was in the range of  60% to 65%. Wall motion was normal;   there were no regional wall motion abnormalities. Features are   consistent with a pseudonormal left ventricular filling pattern,   with concomitant abnormal relaxation and increased filling   pressure (grade 2 diastolic dysfunction). Doppler parameters are   consistent with high ventricular filling pressure. - Aortic valve: Mildly thickened, mildly calcified leaflets. - Mitral valve: There was trivial regurgitation. - Left atrium: The atrium was mildly dilated. - Tricuspid valve: There was mild regurgitation. - Pulmonary arteries: PA peak pressure: 38 mm Hg (S). Impressions: - The right ventricular systolic pressure was increased consistent   with mild pulmonary hypertension.  Nuclear stress test 03/26/16: IMPRESSION: 1. Large fixed defect of the septum and inferior walls compatible with remote infarcts/scar. No significant inducible ischemia with pharmacologic stress. 2. Inferior hypokinesis and septal dyskinesis. 3. Left ventricular ejection fraction 55% 4. Non invasive risk stratification*: High (Test done in the setting of chest pain with recent cocaine use. Troponin negative X 3. Dr. Debara Pickett wrote, "Pt. Seen and examined. Agree with the NP/PA-C note as written. Large fixed inferior wall defect with normal LVEF and inferior hypokinesis, may be scar or artifact. This is NOT a high-risk study, would be intermediate risk. Plan to treat medically at this time and continue to work on lifestyle modification. BP remains elevated - increase hydralazine. Echo pending, however, LVEF appears normal. H/H very low yesterday at 6/18 - may need transfusion (suspect result of SS anemia) - STAT H/H pending. Defer to medicine on this.")  PCXR 11/08/16: IMPRESSION: 1. Borderline cardiomegaly. 2. Bibasilar airspace disease suspicious for pneumonia and/or atelectasis. 3. Sclerotic appearance of both humeral heads which may reflect chronic AVN, dating back to prior  studies from  2005.  He will need labs on arrival . Last labs 11/11/16 showed Cr 4.36, BUN 44, WBC 28.1, H/H 7.3/23.2. PLT 378. Glucose 132.   Discussed above with anesthesiologist Dr. Jenita Seashore. With history of Sickle cell disease, would recommend Bair Hugger on arrival. With history of cocaine use would recommend STAT UDS on arrival given evidence of large infarct on 03/2016 stress test. If UDS positive for cocaine would plan to postpone an elective surgery. I have notified [facility] RN Carol--I also notified her of patient's recent admission and leukocytosis as I was unsure if surgeon would want to delay procedure (although, I don't see that a clear source of leukocytosis was found). She will discuss with Dr. Trula Slade and see if he is okay with proceeding. If Dr. Trula Slade wants a repeat CBC then she will make sure order is entered.    George Hugh [facility] Short Stay Center/Anesthesiology Phone [phone] 11/21/2016 4:16 PM

## 2016-11-21 NOTE — Progress Notes (Signed)
Pt denies SOB, chest pain, and being under the care of a cardiologist. Pt denies having a cardiac cath. Pt stated that he takes 20 units of Lantus insulin at HS normally (changes made to med rec). Pt stated that he was instructed to take no diabetes medications DOS, 10 units of Lantus at HS and no gabapentin DOS. Pt made aware to stop taking vitamins, fish oil and herbal medications. Do not take any NSAIDs ie: Ibuprofen, Advil, Naproxen, BC and Goody Powder. Pt made aware to check BG every 2 hours prior to arrival to hospital on DOS. Pt made aware to treat a BG < 70 with apple juice, wait 15 minutes after intervention to recheck BG, if BG remains < 70, call Short Stay unit to speak with a nurse. Pt verbalized understanding of all pre-op instructions. Anesthesia asked to review pt history.

## 2016-11-21 NOTE — Telephone Encounter (Signed)
rec'd call from PA with Anesthesia Dept.  Advised that pt. Was in hospital recently, and had elevated WBC of 28.1 on 11/11/16.  Also reported the Anesthesiologist is ordering a urine drug screen, in AM, due to past hx of cocaine abuse.    Phone call to Dr. Trula Slade to make him aware of the above.  Rec'd v.o. to keep pt. on the OR schedule, and to add a CBC to be done in the AM.

## 2016-11-22 ENCOUNTER — Encounter (HOSPITAL_COMMUNITY)
Admission: RE | Disposition: A | Discharge status: Home / Self Care | Payer: Self-pay | Source: Ambulatory Visit | Attending: Surgery

## 2016-11-22 ENCOUNTER — Encounter (HOSPITAL_COMMUNITY): Payer: Self-pay | Admitting: Certified Registered Nurse Anesthetist

## 2016-11-22 ENCOUNTER — Ambulatory Visit (HOSPITAL_COMMUNITY): Payer: Medicare HMO | Admitting: Vascular Surgery

## 2016-11-22 ENCOUNTER — Ambulatory Visit (HOSPITAL_COMMUNITY)
Admission: RE | Admit: 2016-11-22 | Discharge: 2016-11-22 | Disposition: A | Discharge status: Home / Self Care | Payer: Medicare HMO | Source: Ambulatory Visit | Attending: Surgery | Admitting: Surgery

## 2016-11-22 DIAGNOSIS — Z992 Dependence on renal dialysis: Secondary | ICD-10-CM | POA: Diagnosis not present

## 2016-11-22 DIAGNOSIS — Z885 Allergy status to narcotic agent status: Secondary | ICD-10-CM | POA: Diagnosis not present

## 2016-11-22 DIAGNOSIS — Z794 Long term (current) use of insulin: Secondary | ICD-10-CM | POA: Insufficient documentation

## 2016-11-22 DIAGNOSIS — Z8249 Family history of ischemic heart disease and other diseases of the circulatory system: Secondary | ICD-10-CM | POA: Insufficient documentation

## 2016-11-22 DIAGNOSIS — D571 Sickle-cell disease without crisis: Secondary | ICD-10-CM | POA: Insufficient documentation

## 2016-11-22 DIAGNOSIS — F1410 Cocaine abuse, uncomplicated: Secondary | ICD-10-CM | POA: Diagnosis not present

## 2016-11-22 DIAGNOSIS — Z800 Family history of malignant neoplasm of digestive organs: Secondary | ICD-10-CM | POA: Diagnosis not present

## 2016-11-22 DIAGNOSIS — N185 Chronic kidney disease, stage 5: Secondary | ICD-10-CM | POA: Diagnosis not present

## 2016-11-22 DIAGNOSIS — Z87891 Personal history of nicotine dependence: Secondary | ICD-10-CM | POA: Diagnosis not present

## 2016-11-22 DIAGNOSIS — Z79899 Other long term (current) drug therapy: Secondary | ICD-10-CM | POA: Insufficient documentation

## 2016-11-22 DIAGNOSIS — Z7982 Long term (current) use of aspirin: Secondary | ICD-10-CM | POA: Diagnosis not present

## 2016-11-22 DIAGNOSIS — I120 Hypertensive chronic kidney disease with stage 5 chronic kidney disease or end stage renal disease: Secondary | ICD-10-CM | POA: Insufficient documentation

## 2016-11-22 DIAGNOSIS — E1122 Type 2 diabetes mellitus with diabetic chronic kidney disease: Secondary | ICD-10-CM | POA: Diagnosis not present

## 2016-11-22 DIAGNOSIS — Z833 Family history of diabetes mellitus: Secondary | ICD-10-CM | POA: Insufficient documentation

## 2016-11-22 DIAGNOSIS — D72829 Elevated white blood cell count, unspecified: Secondary | ICD-10-CM | POA: Diagnosis not present

## 2016-11-22 HISTORY — PX: AV FISTULA PLACEMENT: SHX1204

## 2016-11-22 HISTORY — DX: Cardiac murmur, unspecified: R01.1

## 2016-11-22 HISTORY — DX: Gastro-esophageal reflux disease without esophagitis: K21.9

## 2016-11-22 HISTORY — DX: Pneumonia, unspecified organism: J18.9

## 2016-11-22 LAB — POCT I-STAT 4, (NA,K, GLUC, HGB,HCT)
GLUCOSE: 96 mg/dL (ref 65–99)
HEMATOCRIT: 36 % — AB (ref 39.0–52.0)
HEMOGLOBIN: 12.2 g/dL — AB (ref 13.0–17.0)
Potassium: 5 mmol/L (ref 3.5–5.1)
SODIUM: 141 mmol/L (ref 135–145)

## 2016-11-22 LAB — CBC
HCT: 35.2 % — ABNORMAL LOW (ref 39.0–52.0)
HEMOGLOBIN: 11.2 g/dL — AB (ref 13.0–17.0)
MCH: 22 pg — AB (ref 26.0–34.0)
MCHC: 31.8 g/dL (ref 30.0–36.0)
MCV: 69.3 fL — AB (ref 78.0–100.0)
PLATELETS: 573 10*3/uL — AB (ref 150–400)
RBC: 5.08 MIL/uL (ref 4.22–5.81)
RDW: 19.9 % — ABNORMAL HIGH (ref 11.5–15.5)
WBC: 16.2 10*3/uL — AB (ref 4.0–10.5)

## 2016-11-22 LAB — GLUCOSE, CAPILLARY
GLUCOSE-CAPILLARY: 107 mg/dL — AB (ref 65–99)
Glucose-Capillary: 134 mg/dL — ABNORMAL HIGH (ref 65–99)
Glucose-Capillary: 79 mg/dL (ref 65–99)

## 2016-11-22 LAB — RAPID URINE DRUG SCREEN, HOSP PERFORMED
Amphetamines: NOT DETECTED
Barbiturates: NOT DETECTED
Benzodiazepines: NOT DETECTED
COCAINE: NOT DETECTED
OPIATES: NOT DETECTED
TETRAHYDROCANNABINOL: NOT DETECTED

## 2016-11-22 SURGERY — INSERTION OF ARTERIOVENOUS (AV) GORE-TEX GRAFT ARM
Anesthesia: Monitor Anesthesia Care | Site: Arm Lower | Laterality: Left

## 2016-11-22 MED ORDER — FENTANYL CITRATE (PF) 100 MCG/2ML IJ SOLN
25.0000 ug | INTRAMUSCULAR | Status: DC | PRN
  Administered 2016-11-22: 50 ug via INTRAVENOUS

## 2016-11-22 MED ORDER — PROTAMINE SULFATE 10 MG/ML IV SOLN
INTRAVENOUS | Status: DC | PRN
  Administered 2016-11-22: 20 mg via INTRAVENOUS
  Administered 2016-11-22: 10 mg via INTRAVENOUS
  Administered 2016-11-22: 20 mg via INTRAVENOUS

## 2016-11-22 MED ORDER — FENTANYL CITRATE (PF) 100 MCG/2ML IJ SOLN
INTRAMUSCULAR | Status: AC
  Filled 2016-11-22: qty 2

## 2016-11-22 MED ORDER — PROPOFOL 500 MG/50ML IV EMUL
INTRAVENOUS | Status: DC | PRN
  Administered 2016-11-22: 75 ug/kg/min via INTRAVENOUS

## 2016-11-22 MED ORDER — MIDAZOLAM HCL 2 MG/2ML IJ SOLN
INTRAMUSCULAR | Status: AC
  Filled 2016-11-22: qty 2

## 2016-11-22 MED ORDER — OXYCODONE HCL 5 MG PO TABS
5.0000 mg | ORAL_TABLET | Freq: Four times a day (QID) | ORAL | 0 refills | Status: DC | PRN
Start: 2016-11-22 — End: 2017-11-13

## 2016-11-22 MED ORDER — 0.9 % SODIUM CHLORIDE (POUR BTL) OPTIME
TOPICAL | Status: DC | PRN
  Administered 2016-11-22: 1000 mL

## 2016-11-22 MED ORDER — HEMOSTATIC AGENTS (NO CHARGE) OPTIME
TOPICAL | Status: DC | PRN
  Administered 2016-11-22: 1 via TOPICAL

## 2016-11-22 MED ORDER — HEPARIN SODIUM (PORCINE) 1000 UNIT/ML IJ SOLN
INTRAMUSCULAR | Status: AC
  Filled 2016-11-22: qty 2

## 2016-11-22 MED ORDER — HEPARIN SODIUM (PORCINE) 1000 UNIT/ML IJ SOLN
INTRAMUSCULAR | Status: DC | PRN
  Administered 2016-11-22: 8000 [IU] via INTRAVENOUS

## 2016-11-22 MED ORDER — PROPOFOL 1000 MG/100ML IV EMUL
INTRAVENOUS | Status: AC
  Filled 2016-11-22: qty 100

## 2016-11-22 MED ORDER — SODIUM CHLORIDE 0.9 % IV SOLN
INTRAVENOUS | Status: DC
  Administered 2016-11-22: 12:00:00 via INTRAVENOUS

## 2016-11-22 MED ORDER — LIDOCAINE-EPINEPHRINE (PF) 1 %-1:200000 IJ SOLN
INTRAMUSCULAR | Status: AC
  Filled 2016-11-22: qty 30

## 2016-11-22 MED ORDER — CHLORHEXIDINE GLUCONATE CLOTH 2 % EX PADS: 6.0000 | MEDICATED_PAD | Freq: Once | CUTANEOUS | Status: DC

## 2016-11-22 MED ORDER — LIDOCAINE-EPINEPHRINE (PF) 1 %-1:200000 IJ SOLN
INTRAMUSCULAR | Status: DC | PRN
  Administered 2016-11-22: 30 mL via INTRADERMAL

## 2016-11-22 MED ORDER — PROTAMINE SULFATE 10 MG/ML IV SOLN
INTRAVENOUS | Status: AC
  Filled 2016-11-22: qty 5

## 2016-11-22 MED ORDER — DEXTROSE 50 % IV SOLN
0.5000 | Freq: Once | INTRAVENOUS | Status: AC
  Administered 2016-11-22: 25 mL via INTRAVENOUS

## 2016-11-22 MED ORDER — OXYCODONE-ACETAMINOPHEN 5-325 MG PO TABS: 1.0000 | ORAL_TABLET | Freq: Four times a day (QID) | ORAL | 0 refills | Status: DC | PRN

## 2016-11-22 MED ORDER — DEXTROSE 50 % IV SOLN
INTRAVENOUS | Status: AC
  Administered 2016-11-22: 25 mL via INTRAVENOUS
  Filled 2016-11-22: qty 50

## 2016-11-22 MED ORDER — PROPOFOL 10 MG/ML IV BOLUS
INTRAVENOUS | Status: DC | PRN
  Administered 2016-11-22 (×2): 10 mg via INTRAVENOUS

## 2016-11-22 MED ORDER — FENTANYL CITRATE (PF) 250 MCG/5ML IJ SOLN
INTRAMUSCULAR | Status: AC
  Filled 2016-11-22: qty 5

## 2016-11-22 MED ORDER — MIDAZOLAM HCL 5 MG/5ML IJ SOLN
INTRAMUSCULAR | Status: DC | PRN
  Administered 2016-11-22: 1 mg via INTRAVENOUS

## 2016-11-22 MED ORDER — SODIUM CHLORIDE 0.9 % IV SOLN
INTRAVENOUS | Status: DC | PRN
  Administered 2016-11-22: 500 mL

## 2016-11-22 MED ORDER — ONDANSETRON HCL 4 MG/2ML IJ SOLN
INTRAMUSCULAR | Status: DC | PRN
  Administered 2016-11-22: 4 mg via INTRAVENOUS

## 2016-11-22 SURGICAL SUPPLY — 30 items
ARMBAND PINK RESTRICT EXTREMIT (MISCELLANEOUS) ×6 IMPLANT
CANISTER SUCT 3000ML PPV (MISCELLANEOUS) ×3 IMPLANT
CLIP TI MEDIUM 6 (CLIP) ×3 IMPLANT
CLIP TI WIDE RED SMALL 6 (CLIP) ×3 IMPLANT
COVER PROBE W GEL 5X96 (DRAPES) ×3 IMPLANT
DERMABOND ADVANCED (GAUZE/BANDAGES/DRESSINGS) ×2
DERMABOND ADVANCED .7 DNX12 (GAUZE/BANDAGES/DRESSINGS) ×1 IMPLANT
ELECT REM PT RETURN 9FT ADLT (ELECTROSURGICAL) ×3
ELECTRODE REM PT RTRN 9FT ADLT (ELECTROSURGICAL) ×1 IMPLANT
GLOVE BIOGEL PI IND STRL 7.5 (GLOVE) ×1 IMPLANT
GLOVE BIOGEL PI INDICATOR 7.5 (GLOVE) ×2
GLOVE SURG SS PI 7.5 STRL IVOR (GLOVE) ×3 IMPLANT
GOWN STRL REUS W/ TWL LRG LVL3 (GOWN DISPOSABLE) ×2 IMPLANT
GOWN STRL REUS W/ TWL XL LVL3 (GOWN DISPOSABLE) ×1 IMPLANT
GOWN STRL REUS W/TWL LRG LVL3 (GOWN DISPOSABLE) ×4
GOWN STRL REUS W/TWL XL LVL3 (GOWN DISPOSABLE) ×2
GRAFT GORETEX STRT 6X50 (Vascular Products) ×3 IMPLANT
HEMOSTAT SNOW SURGICEL 2X4 (HEMOSTASIS) ×3 IMPLANT
KIT BASIN OR (CUSTOM PROCEDURE TRAY) ×3 IMPLANT
KIT ROOM TURNOVER OR (KITS) ×3 IMPLANT
NS IRRIG 1000ML POUR BTL (IV SOLUTION) ×3 IMPLANT
PACK CV ACCESS (CUSTOM PROCEDURE TRAY) ×3 IMPLANT
PAD ARMBOARD 7.5X6 YLW CONV (MISCELLANEOUS) ×6 IMPLANT
SUT PROLENE 6 0 BV (SUTURE) ×9 IMPLANT
SUT VIC AB 3-0 SH 27 (SUTURE) ×4
SUT VIC AB 3-0 SH 27X BRD (SUTURE) ×2 IMPLANT
SUT VICRYL 4-0 PS2 18IN ABS (SUTURE) IMPLANT
SYR TOOMEY 50ML (SYRINGE) IMPLANT
UNDERPAD 30X30 (UNDERPADS AND DIAPERS) ×3 IMPLANT
WATER STERILE IRR 1000ML POUR (IV SOLUTION) ×3 IMPLANT

## 2016-11-22 NOTE — Interval H&P Note (Signed)
History and Physical Interval Note:  11/22/2016 9:20 PM  [name]  has presented today for surgery, with the diagnosis of Chronic Kidney Disease Stage 5  The various methods of treatment have been discussed with the patient and family. After consideration of risks, benefits and other options for treatment, the patient has consented to  Procedure(s): INSERTION OF ARTERIOVENOUS (AV) GORE-TEX GRAFT  LEFT FOREARM USING 6MM X 50CM GORETEX GRAFT (Left) as a surgical intervention .  The patient's history has been reviewed, patient examined, no change in status, stable for surgery.  I have reviewed the patient's chart and labs.  Questions were answered to the patient's satisfaction.     Annamarie Major

## 2016-11-22 NOTE — Op Note (Signed)
    Patient name: [name] MRN: [id number] DOB: November 16, 1950 Sex: male  11/22/2016 Pre-operative Diagnosis: CKD Post-operative diagnosis:  Same Surgeon:  Annamarie Major Assistants:  Leontine Locket Procedure:   LEFT FOREARM AVGG (4m PTFE) Anesthesia:  MAC Blood Loss:  See anesthesia record Specimens:  none  Findings:  End to side anastamosis to the vein which was medial.  Indications:  The patient does not have a vein adequate for fistula creation.  He is ready for a graft per nephrology  Procedure:  The patient was identified in the holding area and taken to [id number]  The patient was then placed supine on the table. MAC anesthesia was administered.  The patient was prepped and draped in the usual sterile fashion.  A time out was called and antibiotics were administered.  U/S did not identify an adequate vein for a fistula.  He had a 362mbrachial vein at the elbow.  A longitudinal incision was made just below the ante-cubital crease.  I dissected out the artery and vein.  The artery was healthy.  A straight tunneller was used to create loop in the forearm with a counter incision.  A 45m7mTFE graft was brought through the tunnel.  Heparin was given.  The artery was occluded.  It was opened longitudinally with a #11 blade, just proximal to the brachial bifurcation.  A end to side anastamosis was created with 6-0 prolene.  Upon completion, there was excellent flow through the graft which was then occluded.  I then opened the brachial vein in a longitudinal fashion with a #11 blade and extended this with Potts scissors.  The graft was cut to length and spatulated.  A end to side anastmosis was created with 6-0 prolene surture.  All flushing maneuvers were completed.  There was a good thrill within the graft.  He had a palpable radial pulse.  25 mg of protamine was given.  Once hemostasis was achieved, the incisions were closed with vicryl and dermabond   Disposition:  To PACU stable   V.  WelAnnamarie Major.D. [facility]: [id number]:  [phone]

## 2016-11-22 NOTE — Anesthesia Postprocedure Evaluation (Signed)
Anesthesia Post Note  Patient: Raydan Schlabach Hole  Procedure(s) Performed: Procedure(s) (LRB): INSERTION OF ARTERIOVENOUS (AV) GORE-TEX GRAFT  LEFT FOREARM USING 6MM X 50CM GORETEX GRAFT (Left)     Patient location during evaluation: PACU Anesthesia Type: MAC Level of consciousness: awake and alert Pain management: pain level controlled Vital Signs Assessment: post-procedure vital signs reviewed and stable Respiratory status: spontaneous breathing, nonlabored ventilation and respiratory function stable Cardiovascular status: stable and blood pressure returned to baseline Anesthetic complications: no    Last Vitals:  Vitals:   11/22/16 1519 11/22/16 1530  BP:  124/82  Pulse: 65 64  Resp: 14 14  Temp:      Last Pain:  Vitals:   11/22/16 1519  TempSrc:   PainSc: Asleep                 Haylen Shelnutt,W. EDMOND

## 2016-11-22 NOTE — H&P (View-Only) (Signed)
Vascular and Vein Specialist of [facility]  Patient name: [name]           MRN: [id number]        DOB: 04/27/1951            Sex: male   REFERRING PROVIDER:    Dr. Marval Regal   REASON FOR CONSULT:    Dialysis Access  HISTORY OF PRESENT ILLNESS:   [name] is a 66 y.o. male, who is Referred today for evaluation of dialysis access.  He suffers from stage V chronic renal insufficiency secondary to diabetes, hypertension, cocaine abuse, and sickle cell anemia.  His renal insufficiency is complicated by secondary hyperparathyroidism his hypertension and diabetes are poorly controlled.  He is right-handed.  PAST MEDICAL HISTORY        Past Medical History:  Diagnosis Date  . Diabetes mellitus   . Hypertension   . Renal insufficiency   . Sickle cell anemia (HCC)    Hgb SS disease      FAMILY HISTORY         Family History  Problem Relation Age of Onset  . Venous thrombosis Mother     Dec. of blood clot   . Diabetes type II Mother   . AAA (abdominal aortic aneurysm) Mother   . Throat cancer Father     Deceased of throat cancer    SOCIAL HISTORY:   Social History        Social History  . Marital status: Divorced    Spouse name: N/A  . Number of children: N/A  . Years of education: N/A      Occupational History  . Not on file.         Social History Main Topics  . Smoking status: Former Smoker    Packs/day: 0.50    Years: 20.00    Quit date: 06/05/1966  . Smokeless tobacco: Never Used  . Alcohol use Yes     Comment: 1-2 times per week  . Drug use: Yes    Types: Cocaine     Comment: monday - smoke crack  . Sexual activity: Not on file       Other Topics Concern  . Not on file      Social History Narrative   Retired Administrator, on disability. Former smoker. Living by himself, able to get around [facility].    Has a wife who checks on him but they don't  live together sicne 2007   Quit drinking     ALLERGIES:        Allergies  Allergen Reactions  . Morphine And Related Itching    CURRENT MEDICATIONS:          Current Outpatient Prescriptions  Medication Sig Dispense Refill  . amLODipine (NORVASC) 10 MG tablet Take 1 tablet (10 mg total) by mouth daily. 983 tablet 0  . folic acid (FOLVITE) 1 MG tablet Take 1 tablet (1 mg total) by mouth daily. 30 tablet 0  . furosemide (LASIX) 40 MG tablet Take 1 tablet (40 mg total) by mouth daily. 30 tablet 0  . gabapentin (NEURONTIN) 300 MG capsule Take 300 mg by mouth 3 (three) times daily.    Marland Kitchen glipiZIDE (GLUCOTROL XL) 10 MG 24 hr tablet Take 10 mg by mouth 2 (two) times daily.    Marland Kitchen LANTUS SOLOSTAR 100 UNIT/ML Solostar Pen     . NOVOLOG FLEXPEN 100 UNIT/ML FlexPen     . omeprazole (PRILOSEC) 20 MG capsule  Take 20 mg by mouth daily.   5  . Sucroferric Oxyhydroxide (VELPHORO PO) Take by mouth.    Marland Kitchen aspirin EC 325 MG tablet Take 325 mg by mouth daily.    Marland Kitchen lanthanum (FOSRENOL) 1000 MG chewable tablet Chew 1 tablet (1,000 mg total) by mouth 3 (three) times daily with meals. (Patient not taking: Reported on 07/17/2016) 90 tablet 0  . oxyCODONE-acetaminophen (PERCOCET/ROXICET) 5-325 MG tablet Take 1 tablet by mouth every 4 (four) hours as needed for moderate pain. (Patient not taking: Reported on 07/17/2016) 30 tablet 0   No current facility-administered medications for this visit.     REVIEW OF SYSTEMS:   [X]  denotes positive finding, [ ]  denotes negative finding Cardiac  Comments:  Chest pain or chest pressure:    Shortness of breath upon exertion:    Short of breath when lying flat:    Irregular heart rhythm: x       Vascular    Pain in calf, thigh, or hip brought on by ambulation:    Pain in feet at night that wakes you up from your sleep:  x   Blood clot in your veins:    Leg swelling:         Pulmonary    Oxygen at home:      Productive cough:     Wheezing:         Neurologic    Sudden weakness in arms or legs:     Sudden numbness in arms or legs:     Sudden onset of difficulty speaking or slurred speech:    Temporary loss of vision in one eye:  x   Problems with dizziness:         Gastrointestinal    Blood in stool:      Vomited blood:         Genitourinary    Burning when urinating:     Blood in urine:        Psychiatric    Major depression:         Hematologic    Bleeding problems:    Problems with blood clotting too easily:        Skin    Rashes or ulcers:        Constitutional    Fever or chills:     PHYSICAL EXAM:       Vitals:   07/17/16 1010 07/17/16 1011  BP: (!) 146/85 (!) 144/82  Pulse: 76   Resp: 20   Temp: 97.1 F (36.2 C)   TempSrc: Oral   SpO2: 98%   Weight: 168 lb 6.4 oz (76.4 kg)   Height: 6\' 1"  (1.854 m)     GENERAL: The patient is a well-nourished male, in no acute distress. The vital signs are documented above. CARDIAC: There is a regular rate and rhythm.  VASCULAR: Palpable left radial and brachial pulse PULMONARY: Nonlabored respirations MUSCULOSKELETAL: There are no major deformities or cyanosis. NEUROLOGIC: No focal weakness or paresthesias are detected. SKIN: There are no ulcers or rashes noted. PSYCHIATRIC: The patient has a normal affect.  STUDIES:   Vein mapping today shows suboptimal cephalic and basilic veins for fistula creation. Arterial evaluation was normal  ASSESSMENT and PLAN   Stage V renal insufficiency: The patient is not a candidate for fistula creation based off of his vein mapping studies today.  Therefore I will consider placing a left forearm versus upper arm graft when the patient is closer to dialysis.  The  risks and benefits of the operation were discussed with the patient.  Once Dr. Marval Regal states that he would like to have this done, he can  be scheduled without having him brought back to the office.   Annamarie Major, MD [facility]ascular and Vein Specialists of [facility] [phone] Pager [phone]   Patient is not a candidate for fistula based on vein mapoping.  He is right handed.  We having been waiting for renal to tell us to proceed with AVGG  PE: CV:RRR PULM:CTAB Palp radial pulse  Plan left arm AVGG.  Risks and benefits discussed.  Annamarie Major

## 2016-11-22 NOTE — Discharge Instructions (Signed)
° ° °  11/22/2016 [name] [id number] [birth year]  Surgeon(s): Serafina Mitchell, MD  Procedure(s): INSERTION OF ARTERIOVENOUS (AV) GORE-TEX GRAFT  LEFT FOREARM USING 6MM X 50CM GORETEX GRAFT  x Do not stick graft for 4 weeks

## 2016-11-22 NOTE — Anesthesia Preprocedure Evaluation (Signed)
Anesthesia Evaluation  Patient identified by MRN, date of birth, ID band Patient awake    Reviewed: Allergy & Precautions, H&P , NPO status , Patient's Chart, lab work & pertinent test results, reviewed documented beta blocker date and time   Airway Mallampati: II  TM Distance: >3 FB Neck ROM: Full    Dental no notable dental hx. (+) Edentulous Upper, Partial Lower, Dental Advisory Given   Pulmonary neg pulmonary ROS, former smoker,    Pulmonary exam normal breath sounds clear to auscultation       Cardiovascular hypertension, Pt. on medications and Pt. on home beta blockers  Rhythm:Regular Rate:Normal     Neuro/Psych negative neurological ROS  negative psych ROS   GI/Hepatic Neg liver ROS, GERD  Medicated and Controlled,  Endo/Other  diabetes, Insulin Dependent, Oral Hypoglycemic Agents  Renal/GU Renal InsufficiencyRenal disease  negative genitourinary   Musculoskeletal   Abdominal   Peds  Hematology negative hematology ROS (+) Sickle cell anemia and anemia ,   Anesthesia Other Findings   Reproductive/Obstetrics negative OB ROS                             Anesthesia Physical Anesthesia Plan  ASA: III  Anesthesia Plan: MAC   Post-op Pain Management:    Induction: Intravenous  PONV Risk Score and Plan: 2 and Ondansetron, Propofol and Midazolam  Airway Management Planned: Simple Face Mask  Additional Equipment:   Intra-op Plan:   Post-operative Plan:   Informed Consent: I have reviewed the patients History and Physical, chart, labs and discussed the procedure including the risks, benefits and alternatives for the proposed anesthesia with the patient or authorized representative who has indicated his/her understanding and acceptance.   Dental advisory given  Plan Discussed with: CRNA  Anesthesia Plan Comments:         Anesthesia Quick Evaluation

## 2016-11-22 NOTE — Transfer of Care (Signed)
Immediate Anesthesia Transfer of Care Note  Patient: Macai Sisneros Wynder  Procedure(s) Performed: Procedure(s): INSERTION OF ARTERIOVENOUS (AV) GORE-TEX GRAFT  LEFT FOREARM USING 6MM X 50CM GORETEX GRAFT (Left)  Patient Location: PACU  Anesthesia Type:MAC  Level of Consciousness: awake, alert , oriented and patient cooperative  Airway & Oxygen Therapy: Patient Spontanous Breathing and Patient connected to nasal cannula oxygen  Post-op Assessment: Report given to RN and Post -op Vital signs reviewed and stable  Post vital signs: Reviewed and stable  Last Vitals:  Vitals:   11/22/16 1458 11/22/16 1500  BP:  137/81  Pulse: 75 76  Resp: 20 20  Temp: 36.4 C     Last Pain:  Vitals:   11/22/16 1154  TempSrc: Oral  PainSc:       Patients Stated Pain Goal: 2 (85/63/14 9702)  Complications: No apparent anesthesia complications

## 2016-11-22 NOTE — Consult Note (Signed)
Vascular and Vein Specialist of [facility]  Patient name: [name]           MRN: [id number]        DOB: [birth year]            Sex: male   REFERRING PROVIDER:    Dr. Marval Regal   REASON FOR CONSULT:    Dialysis Access  HISTORY OF PRESENT ILLNESS:   [name] is a 66 y.o. male, who is Referred today for evaluation of dialysis access.  He suffers from stage V chronic renal insufficiency secondary to diabetes, hypertension, cocaine abuse, and sickle cell anemia.  His renal insufficiency is complicated by secondary hyperparathyroidism his hypertension and diabetes are poorly controlled.  He is right-handed.  PAST MEDICAL HISTORY        Past Medical History:  Diagnosis Date  . Diabetes mellitus   . Hypertension   . Renal insufficiency   . Sickle cell anemia (HCC)    Hgb SS disease      FAMILY HISTORY         Family History  Problem Relation Age of Onset  . Venous thrombosis Mother     Dec. of blood clot   . Diabetes type II Mother   . AAA (abdominal aortic aneurysm) Mother   . Throat cancer Father     Deceased of throat cancer    SOCIAL HISTORY:   Social History        Social History  . Marital status: Divorced    Spouse name: N/A  . Number of children: N/A  . Years of education: N/A      Occupational History  . Not on file.         Social History Main Topics  . Smoking status: Former Smoker    Packs/day: 0.50    Years: 20.00    Quit date: 06/05/1966  . Smokeless tobacco: Never Used  . Alcohol use Yes     Comment: 1-2 times per week  . Drug use: Yes    Types: Cocaine     Comment: monday - smoke crack  . Sexual activity: Not on file       Other Topics Concern  . Not on file      Social History Narrative   Retired Administrator, on disability. Former smoker. Living by himself, able to get around [facility].    Has a wife who checks on him but they don't  live together sicne 2007   Quit drinking     ALLERGIES:        Allergies  Allergen Reactions  . Morphine And Related Itching    CURRENT MEDICATIONS:          Current Outpatient Prescriptions  Medication Sig Dispense Refill  . amLODipine (NORVASC) 10 MG tablet Take 1 tablet (10 mg total) by mouth daily. 672 tablet 0  . folic acid (FOLVITE) 1 MG tablet Take 1 tablet (1 mg total) by mouth daily. 30 tablet 0  . furosemide (LASIX) 40 MG tablet Take 1 tablet (40 mg total) by mouth daily. 30 tablet 0  . gabapentin (NEURONTIN) 300 MG capsule Take 300 mg by mouth 3 (three) times daily.    Marland Kitchen glipiZIDE (GLUCOTROL XL) 10 MG 24 hr tablet Take 10 mg by mouth 2 (two) times daily.    Marland Kitchen LANTUS SOLOSTAR 100 UNIT/ML Solostar Pen     . NOVOLOG FLEXPEN 100 UNIT/ML FlexPen     . omeprazole (PRILOSEC) 20 MG capsule  Take 20 mg by mouth daily.   5  . Sucroferric Oxyhydroxide (VELPHORO PO) Take by mouth.    Marland Kitchen aspirin EC 325 MG tablet Take 325 mg by mouth daily.    Marland Kitchen lanthanum (FOSRENOL) 1000 MG chewable tablet Chew 1 tablet (1,000 mg total) by mouth 3 (three) times daily with meals. (Patient not taking: Reported on 07/17/2016) 90 tablet 0  . oxyCODONE-acetaminophen (PERCOCET/ROXICET) 5-325 MG tablet Take 1 tablet by mouth every 4 (four) hours as needed for moderate pain. (Patient not taking: Reported on 07/17/2016) 30 tablet 0   No current facility-administered medications for this visit.     REVIEW OF SYSTEMS:   [X]  denotes positive finding, [ ]  denotes negative finding Cardiac  Comments:  Chest pain or chest pressure:    Shortness of breath upon exertion:    Short of breath when lying flat:    Irregular heart rhythm: x       Vascular    Pain in calf, thigh, or hip brought on by ambulation:    Pain in feet at night that wakes you up from your sleep:  x   Blood clot in your veins:    Leg swelling:         Pulmonary    Oxygen at home:      Productive cough:     Wheezing:         Neurologic    Sudden weakness in arms or legs:     Sudden numbness in arms or legs:     Sudden onset of difficulty speaking or slurred speech:    Temporary loss of vision in one eye:  x   Problems with dizziness:         Gastrointestinal    Blood in stool:      Vomited blood:         Genitourinary    Burning when urinating:     Blood in urine:        Psychiatric    Major depression:         Hematologic    Bleeding problems:    Problems with blood clotting too easily:        Skin    Rashes or ulcers:        Constitutional    Fever or chills:     PHYSICAL EXAM:       Vitals:   07/17/16 1010 07/17/16 1011  BP: (!) 146/85 (!) 144/82  Pulse: 76   Resp: 20   Temp: 97.1 F (36.2 C)   TempSrc: Oral   SpO2: 98%   Weight: 168 lb 6.4 oz (76.4 kg)   Height: 6\' 1"  (1.854 m)     GENERAL: The patient is a well-nourished male, in no acute distress. The vital signs are documented above. CARDIAC: There is a regular rate and rhythm.  VASCULAR: Palpable left radial and brachial pulse PULMONARY: Nonlabored respirations MUSCULOSKELETAL: There are no major deformities or cyanosis. NEUROLOGIC: No focal weakness or paresthesias are detected. SKIN: There are no ulcers or rashes noted. PSYCHIATRIC: The patient has a normal affect.  STUDIES:   Vein mapping today shows suboptimal cephalic and basilic veins for fistula creation. Arterial evaluation was normal  ASSESSMENT and PLAN   Stage V renal insufficiency: The patient is not a candidate for fistula creation based off of his vein mapping studies today.  Therefore I will consider placing a left forearm versus upper arm graft when the patient is closer to dialysis.  The  risks and benefits of the operation were discussed with the patient.  Once Dr. Marval Regal states that he would like to have this done, he can  be scheduled without having him brought back to the office.   Annamarie Major, MD Vascular and Vein Specialists of [facility] [phone] Pager [phone]   Patient is not a candidate for fistula based on vein mapoping.  He is right handed.  We having been waiting for renal to tell us to proceed with AVGG  PE: CV:RRR PULM:CTAB Palp radial pulse  Plan left arm AVGG.  Risks and benefits discussed.  Annamarie Major

## 2016-11-22 NOTE — Anesthesia Procedure Notes (Signed)
Procedure Name: MAC Date/Time: 11/22/2016 1:00 PM Performed by: Candis Shine Pre-anesthesia Checklist: Patient identified, Emergency Drugs available, Suction available, Patient being monitored and Timeout performed Patient Re-evaluated:Patient Re-evaluated prior to inductionOxygen Delivery Method: Simple face mask Dental Injury: Teeth and Oropharynx as per pre-operative assessment

## 2016-11-23 ENCOUNTER — Telehealth: Payer: Self-pay | Admitting: Surgery

## 2016-11-23 ENCOUNTER — Encounter (HOSPITAL_COMMUNITY): Payer: Self-pay | Admitting: Surgery

## 2016-11-23 NOTE — Telephone Encounter (Signed)
-----   Message from Mena Goes, RN sent at 11/22/2016  3:53 PM EDT ----- Regarding: 2-3 weeks   ----- Message ----- From: Natividad Brood Sent: 11/22/2016   2:49 PM To: Vvs Charge Pool  S/p left FA loop graft 11/22/16.  F/u with Dr. Trula Slade in 2-3 weeks.  Thanks

## 2016-11-23 NOTE — Telephone Encounter (Signed)
Sched appt 12/25/16 at 2:00. Lm on hm# for pt to confirm appt.

## 2016-12-13 ENCOUNTER — Encounter: Payer: Self-pay | Admitting: Surgery

## 2016-12-25 ENCOUNTER — Ambulatory Visit (INDEPENDENT_AMBULATORY_CARE_PROVIDER_SITE_OTHER): Payer: Self-pay | Admitting: Vascular Surgery

## 2016-12-25 ENCOUNTER — Encounter: Payer: Self-pay | Admitting: Surgery

## 2016-12-25 VITALS — BP 143/74 | HR 103 | Temp 101.6°F | Resp 20 | Ht 73.0 in | Wt 190.0 lb

## 2016-12-25 DIAGNOSIS — N185 Chronic kidney disease, stage 5: Secondary | ICD-10-CM

## 2016-12-25 NOTE — Progress Notes (Signed)
Patient name: [name] MRN: [id number] DOB: [birth year] Sex: male  REASON FOR VISIT: Postop  HPI: [name] is a 66 y.o. male who presents for postoperative follow-up status post left forearm loop graft. The patient is not yet on hemodialysis. He reports he is nearing dialysis. His nephrologist is Dr. Marval Regal. The patient denies any pain or numbness in his left hand or arm. He has not had any issues since his surgery.  Current Outpatient Prescriptions  Medication Sig Dispense Refill  . acetaminophen (TYLENOL) 500 MG tablet Take 500 mg by mouth every 6 (six) hours as needed for headache (pain).    Marland Kitchen amLODipine (NORVASC) 10 MG tablet Take 1 tablet (10 mg total) by mouth daily. 360 tablet 0  . calcium acetate (PHOSLO) 667 MG tablet Take 667 mg by mouth 3 (three) times daily.    . carvedilol (COREG) 12.5 MG tablet Take 12.5 mg by mouth 2 (two) times daily.  6  . Docusate Calcium (STOOL SOFTENER PO) Take 1 capsule by mouth daily as needed (constipation).    . folic acid (FOLVITE) 1 MG tablet Take 1 tablet (1 mg total) by mouth daily. 30 tablet 0  . furosemide (LASIX) 40 MG tablet Take 1 tablet (40 mg total) by mouth daily. 30 tablet 0  . gabapentin (NEURONTIN) 300 MG capsule Take 300 mg by mouth 3 (three) times daily.    Marland Kitchen glipiZIDE (GLUCOTROL XL) 10 MG 24 hr tablet Take 10 mg by mouth daily with breakfast.     . insulin aspart (NOVOLOG FLEXPEN) 100 UNIT/ML FlexPen Inject 2-6 Units into the skin 3 (three) times daily as needed for high blood sugar (CBG >150).    . insulin glargine (LANTUS) 100 unit/mL SOPN Inject 20 Units into the skin at bedtime.     Marland Kitchen omeprazole (PRILOSEC) 20 MG capsule Take 20 mg by mouth daily.   5  . oxyCODONE (ROXICODONE) 5 MG immediate release tablet Take 1 tablet (5 mg total) by mouth every 6 (six) hours as needed. 10 tablet 0  . polyethylene glycol powder (MIRALAX) powder Take 17 g by mouth daily. (Patient taking differently: Take 17 g by mouth daily as needed  (constipation). Mix in 8 oz liquid and drink) 255 g 0  . saxagliptin HCl (ONGLYZA) 5 MG TABS tablet Take 5 mg by mouth daily.    . Sennosides (SENNA LAXATIVE PO) Take 1 tablet by mouth daily as needed (constipation).     No current facility-administered medications for this visit.     REVIEW OF SYSTEMS:  [X]  denotes positive finding, [ ]  denotes negative finding Cardiac  Comments:  Chest pain or chest pressure:    Shortness of breath upon exertion:    Short of breath when lying flat:    Irregular heart rhythm:    Constitutional    Fever or chills:      PHYSICAL EXAM: Vitals:   12/25/16 1354  BP: (!) 143/74  Pulse: (!) 103  Resp: 20  Temp: (!) 101.6 F (38.7 C)  SpO2: 98%  Weight: 190 lb (86.2 kg)  Height: 6\' 1"  (1.854 m)    GENERAL: The patient is a well-nourished male, in no acute distress. The vital signs are documented above. VASCULAR: Left forearm loop graft with palpable thrill and audible bruit. Incisions are healed. Left hand is warm with sensation and motor function intact.  MEDICAL ISSUES: CKD stage V S/p left forearm loop graft  The patient's incisions are well-healed. His graft has a  palpable thrill and audible bruit. His graft can be cannulated whenever he is ready for dialysis. No signs of steal. He will follow-up as needed.  Virgina Jock, PA-C [facility] and Vein Specialists of [facility] MD: Trula Slade

## 2016-12-26 ENCOUNTER — Emergency Department (HOSPITAL_COMMUNITY): Payer: Medicare HMO

## 2016-12-26 ENCOUNTER — Telehealth: Payer: Self-pay | Admitting: *Deleted

## 2016-12-26 ENCOUNTER — Encounter (HOSPITAL_COMMUNITY): Payer: Self-pay

## 2016-12-26 DIAGNOSIS — A419 Sepsis, unspecified organism: Principal | ICD-10-CM | POA: Diagnosis present

## 2016-12-26 DIAGNOSIS — Z9842 Cataract extraction status, left eye: Secondary | ICD-10-CM

## 2016-12-26 DIAGNOSIS — Z85038 Personal history of other malignant neoplasm of large intestine: Secondary | ICD-10-CM

## 2016-12-26 DIAGNOSIS — D571 Sickle-cell disease without crisis: Secondary | ICD-10-CM | POA: Diagnosis present

## 2016-12-26 DIAGNOSIS — I120 Hypertensive chronic kidney disease with stage 5 chronic kidney disease or end stage renal disease: Secondary | ICD-10-CM | POA: Diagnosis present

## 2016-12-26 DIAGNOSIS — N186 End stage renal disease: Secondary | ICD-10-CM | POA: Diagnosis present

## 2016-12-26 DIAGNOSIS — Z79899 Other long term (current) drug therapy: Secondary | ICD-10-CM

## 2016-12-26 DIAGNOSIS — Z87891 Personal history of nicotine dependence: Secondary | ICD-10-CM

## 2016-12-26 DIAGNOSIS — Z833 Family history of diabetes mellitus: Secondary | ICD-10-CM

## 2016-12-26 DIAGNOSIS — L03114 Cellulitis of left upper limb: Secondary | ICD-10-CM | POA: Diagnosis not present

## 2016-12-26 DIAGNOSIS — Z885 Allergy status to narcotic agent status: Secondary | ICD-10-CM

## 2016-12-26 DIAGNOSIS — Z961 Presence of intraocular lens: Secondary | ICD-10-CM | POA: Diagnosis present

## 2016-12-26 DIAGNOSIS — E1122 Type 2 diabetes mellitus with diabetic chronic kidney disease: Secondary | ICD-10-CM | POA: Diagnosis present

## 2016-12-26 DIAGNOSIS — Z794 Long term (current) use of insulin: Secondary | ICD-10-CM

## 2016-12-26 DIAGNOSIS — K219 Gastro-esophageal reflux disease without esophagitis: Secondary | ICD-10-CM | POA: Diagnosis present

## 2016-12-26 DIAGNOSIS — Z9841 Cataract extraction status, right eye: Secondary | ICD-10-CM

## 2016-12-26 DIAGNOSIS — E8770 Fluid overload, unspecified: Secondary | ICD-10-CM | POA: Diagnosis present

## 2016-12-26 LAB — COMPREHENSIVE METABOLIC PANEL
ALBUMIN: 3.7 g/dL (ref 3.5–5.0)
ALK PHOS: 75 U/L (ref 38–126)
ALT: 8 U/L — ABNORMAL LOW (ref 17–63)
ANION GAP: 10 (ref 5–15)
AST: 16 U/L (ref 15–41)
BILIRUBIN TOTAL: 1.6 mg/dL — AB (ref 0.3–1.2)
BUN: 45 mg/dL — ABNORMAL HIGH (ref 6–20)
CALCIUM: 9.5 mg/dL (ref 8.9–10.3)
CO2: 26 mmol/L (ref 22–32)
Chloride: 100 mmol/L — ABNORMAL LOW (ref 101–111)
Creatinine, Ser: 4.83 mg/dL — ABNORMAL HIGH (ref 0.61–1.24)
GFR calc non Af Amer: 11 mL/min — ABNORMAL LOW (ref >60)
GFR, EST AFRICAN AMERICAN: 13 mL/min — AB (ref >60)
GLUCOSE: 192 mg/dL — AB (ref 65–99)
Potassium: 4.6 mmol/L (ref 3.5–5.1)
Sodium: 136 mmol/L (ref 135–145)
TOTAL PROTEIN: 7.8 g/dL (ref 6.5–8.1)

## 2016-12-26 LAB — PROTIME-INR
INR: 1.02
PROTHROMBIN TIME: 13.5 s (ref 11.4–15.2)

## 2016-12-26 LAB — CBC WITH DIFFERENTIAL/PLATELET
BASOS PCT: 0 %
Basophils Absolute: 0 10*3/uL (ref 0.0–0.1)
EOS ABS: 0.6 10*3/uL (ref 0.0–0.7)
Eosinophils Relative: 3 %
HCT: 21.1 % — ABNORMAL LOW (ref 39.0–52.0)
Hemoglobin: 6.7 g/dL — CL (ref 13.0–17.0)
LYMPHS ABS: 2.6 10*3/uL (ref 0.7–4.0)
Lymphocytes Relative: 12 %
MCH: 21.2 pg — AB (ref 26.0–34.0)
MCHC: 31.8 g/dL (ref 30.0–36.0)
MCV: 66.8 fL — ABNORMAL LOW (ref 78.0–100.0)
MONO ABS: 2.8 10*3/uL — AB (ref 0.1–1.0)
Monocytes Relative: 13 %
NEUTROS PCT: 72 %
Neutro Abs: 15.3 10*3/uL — ABNORMAL HIGH (ref 1.7–7.7)
PLATELETS: 319 10*3/uL (ref 150–400)
RBC: 3.16 MIL/uL — ABNORMAL LOW (ref 4.22–5.81)
RDW: 19.6 % — AB (ref 11.5–15.5)
WBC: 21.3 10*3/uL — ABNORMAL HIGH (ref 4.0–10.5)

## 2016-12-26 LAB — I-STAT CG4 LACTIC ACID, ED: Lactic Acid, Venous: 1.1 mmol/L (ref 0.5–1.9)

## 2016-12-26 NOTE — Telephone Encounter (Signed)
Returned call to patient.  [name] had called earlier stating that the patient has ha a temperature of 101.2, 100.2, and 99.7.  She states that he was complaining of throbbing pain and the area was still swollen.  She states that she had given him tylenol and applied ice.  [name] as well, and he stated that his pain # is at 5 and no longer throbbing.  I encouraged the patient to elevate his arm on a pillow and move his fingers often.  He is to call back if his temperature should elevate or his arm worsens with the throbbing pain.  [name] voiced understanding of these instructions.

## 2016-12-26 NOTE — ED Triage Notes (Signed)
Pt arrives to ed with multiple complaints. HE is febrile with SOB x several days and a non productive cough. Denies chest pain. PT reports his doctor told him to come in to ED dt pain and swelling to graft placed 1 month ago. PT reports he was at dr office yesterday when the nurse put bp cuff on left arm to get bp. He then started having swelling to the site, pain, and decreased thrill palpated.

## 2016-12-26 NOTE — ED Notes (Signed)
No tylenol given in triage d/t pt taking 30pta.

## 2016-12-27 ENCOUNTER — Inpatient Hospital Stay (HOSPITAL_COMMUNITY)
Admission: EM | Admit: 2016-12-27 | Discharge: 2016-12-29 | DRG: 871 | Disposition: A | Discharge status: Home / Self Care | Payer: Medicare HMO | Attending: Family Medicine | Admitting: Family Medicine

## 2016-12-27 ENCOUNTER — Encounter (HOSPITAL_COMMUNITY): Payer: Self-pay | Admitting: Internal Medicine

## 2016-12-27 DIAGNOSIS — E8770 Fluid overload, unspecified: Secondary | ICD-10-CM | POA: Diagnosis present

## 2016-12-27 DIAGNOSIS — Z885 Allergy status to narcotic agent status: Secondary | ICD-10-CM | POA: Diagnosis not present

## 2016-12-27 DIAGNOSIS — M7989 Other specified soft tissue disorders: Secondary | ICD-10-CM | POA: Diagnosis not present

## 2016-12-27 DIAGNOSIS — N186 End stage renal disease: Secondary | ICD-10-CM | POA: Diagnosis present

## 2016-12-27 DIAGNOSIS — I120 Hypertensive chronic kidney disease with stage 5 chronic kidney disease or end stage renal disease: Secondary | ICD-10-CM | POA: Diagnosis present

## 2016-12-27 DIAGNOSIS — E1122 Type 2 diabetes mellitus with diabetic chronic kidney disease: Secondary | ICD-10-CM | POA: Diagnosis present

## 2016-12-27 DIAGNOSIS — E1121 Type 2 diabetes mellitus with diabetic nephropathy: Secondary | ICD-10-CM | POA: Diagnosis not present

## 2016-12-27 DIAGNOSIS — L03114 Cellulitis of left upper limb: Secondary | ICD-10-CM | POA: Diagnosis present

## 2016-12-27 DIAGNOSIS — Z87891 Personal history of nicotine dependence: Secondary | ICD-10-CM | POA: Diagnosis not present

## 2016-12-27 DIAGNOSIS — K219 Gastro-esophageal reflux disease without esophagitis: Secondary | ICD-10-CM | POA: Diagnosis present

## 2016-12-27 DIAGNOSIS — A419 Sepsis, unspecified organism: Principal | ICD-10-CM

## 2016-12-27 DIAGNOSIS — Z833 Family history of diabetes mellitus: Secondary | ICD-10-CM | POA: Diagnosis not present

## 2016-12-27 DIAGNOSIS — E1129 Type 2 diabetes mellitus with other diabetic kidney complication: Secondary | ICD-10-CM | POA: Diagnosis not present

## 2016-12-27 DIAGNOSIS — Z961 Presence of intraocular lens: Secondary | ICD-10-CM | POA: Diagnosis present

## 2016-12-27 DIAGNOSIS — Z794 Long term (current) use of insulin: Secondary | ICD-10-CM | POA: Diagnosis not present

## 2016-12-27 DIAGNOSIS — M79632 Pain in left forearm: Secondary | ICD-10-CM | POA: Diagnosis not present

## 2016-12-27 DIAGNOSIS — N185 Chronic kidney disease, stage 5: Secondary | ICD-10-CM | POA: Diagnosis present

## 2016-12-27 DIAGNOSIS — Z79899 Other long term (current) drug therapy: Secondary | ICD-10-CM | POA: Diagnosis not present

## 2016-12-27 DIAGNOSIS — Z9842 Cataract extraction status, left eye: Secondary | ICD-10-CM | POA: Diagnosis not present

## 2016-12-27 DIAGNOSIS — D571 Sickle-cell disease without crisis: Secondary | ICD-10-CM | POA: Diagnosis present

## 2016-12-27 DIAGNOSIS — N184 Chronic kidney disease, stage 4 (severe): Secondary | ICD-10-CM | POA: Diagnosis not present

## 2016-12-27 DIAGNOSIS — Z9841 Cataract extraction status, right eye: Secondary | ICD-10-CM | POA: Diagnosis not present

## 2016-12-27 DIAGNOSIS — Z85038 Personal history of other malignant neoplasm of large intestine: Secondary | ICD-10-CM | POA: Diagnosis not present

## 2016-12-27 LAB — CBC WITH DIFFERENTIAL/PLATELET
BASOS ABS: 0 10*3/uL (ref 0.0–0.1)
Basophils Relative: 0 %
EOS ABS: 0.7 10*3/uL (ref 0.0–0.7)
Eosinophils Relative: 3 %
HCT: 21.9 % — ABNORMAL LOW (ref 39.0–52.0)
Hemoglobin: 7.4 g/dL — ABNORMAL LOW (ref 13.0–17.0)
LYMPHS ABS: 3.8 10*3/uL (ref 0.7–4.0)
Lymphocytes Relative: 17 %
MCH: 22.2 pg — AB (ref 26.0–34.0)
MCHC: 33.8 g/dL (ref 30.0–36.0)
MCV: 65.8 fL — ABNORMAL LOW (ref 78.0–100.0)
MONO ABS: 3.1 10*3/uL — AB (ref 0.1–1.0)
Monocytes Relative: 14 %
NEUTROS ABS: 14.8 10*3/uL — AB (ref 1.7–7.7)
Neutrophils Relative %: 66 %
PLATELETS: 252 10*3/uL (ref 150–400)
RBC: 3.33 MIL/uL — ABNORMAL LOW (ref 4.22–5.81)
RDW: 19.4 % — ABNORMAL HIGH (ref 11.5–15.5)
WBC: 22.4 10*3/uL — ABNORMAL HIGH (ref 4.0–10.5)

## 2016-12-27 LAB — URINALYSIS, ROUTINE W REFLEX MICROSCOPIC
Bacteria, UA: NONE SEEN
Bilirubin Urine: NEGATIVE
Glucose, UA: NEGATIVE mg/dL
HGB URINE DIPSTICK: NEGATIVE
KETONES UR: NEGATIVE mg/dL
LEUKOCYTES UA: NEGATIVE
Nitrite: NEGATIVE
PROTEIN: 100 mg/dL — AB
Specific Gravity, Urine: 1.012 (ref 1.005–1.030)
pH: 5 (ref 5.0–8.0)

## 2016-12-27 LAB — COMPREHENSIVE METABOLIC PANEL
ALK PHOS: 69 U/L (ref 38–126)
ALT: 7 U/L — AB (ref 17–63)
ANION GAP: 10 (ref 5–15)
AST: 16 U/L (ref 15–41)
Albumin: 3.3 g/dL — ABNORMAL LOW (ref 3.5–5.0)
BILIRUBIN TOTAL: 1.6 mg/dL — AB (ref 0.3–1.2)
BUN: 46 mg/dL — AB (ref 6–20)
CALCIUM: 9.2 mg/dL (ref 8.9–10.3)
CO2: 25 mmol/L (ref 22–32)
CREATININE: 4.79 mg/dL — AB (ref 0.61–1.24)
Chloride: 101 mmol/L (ref 101–111)
GFR calc Af Amer: 13 mL/min — ABNORMAL LOW (ref >60)
GFR calc non Af Amer: 11 mL/min — ABNORMAL LOW (ref >60)
GLUCOSE: 134 mg/dL — AB (ref 65–99)
Potassium: 5 mmol/L (ref 3.5–5.1)
Sodium: 136 mmol/L (ref 135–145)
TOTAL PROTEIN: 7.6 g/dL (ref 6.5–8.1)

## 2016-12-27 LAB — GLUCOSE, CAPILLARY
GLUCOSE-CAPILLARY: 130 mg/dL — AB (ref 65–99)
Glucose-Capillary: 130 mg/dL — ABNORMAL HIGH (ref 65–99)

## 2016-12-27 LAB — CBG MONITORING, ED: GLUCOSE-CAPILLARY: 112 mg/dL — AB (ref 65–99)

## 2016-12-27 LAB — I-STAT CG4 LACTIC ACID, ED: LACTIC ACID, VENOUS: 1.22 mmol/L (ref 0.5–1.9)

## 2016-12-27 MED ORDER — INSULIN ASPART 100 UNIT/ML ~~LOC~~ SOLN: 0.0000 [IU] | Freq: Every day | SUBCUTANEOUS | Status: DC

## 2016-12-27 MED ORDER — AMLODIPINE BESYLATE 10 MG PO TABS
10.0000 mg | ORAL_TABLET | Freq: Every day | ORAL | Status: DC
  Administered 2016-12-27 – 2016-12-29 (×3): 10 mg via ORAL
  Filled 2016-12-27 (×3): qty 1

## 2016-12-27 MED ORDER — POLYETHYLENE GLYCOL 3350 17 G PO PACK: 17.0000 g | PACK | Freq: Every day | ORAL | Status: DC | PRN

## 2016-12-27 MED ORDER — PANTOPRAZOLE SODIUM 40 MG PO TBEC
40.0000 mg | DELAYED_RELEASE_TABLET | Freq: Every day | ORAL | Status: DC
  Administered 2016-12-27 – 2016-12-29 (×3): 40 mg via ORAL
  Filled 2016-12-27 (×3): qty 1

## 2016-12-27 MED ORDER — INSULIN GLARGINE 100 UNITS/ML SOLOSTAR PEN: 20.0000 [IU] | PEN_INJECTOR | Freq: Every day | SUBCUTANEOUS | Status: DC

## 2016-12-27 MED ORDER — FUROSEMIDE 10 MG/ML IJ SOLN
120.0000 mg | Freq: Two times a day (BID) | INTRAVENOUS | Status: DC
  Administered 2016-12-27 – 2016-12-29 (×4): 120 mg via INTRAVENOUS
  Filled 2016-12-27 (×3): qty 12
  Filled 2016-12-27: qty 10
  Filled 2016-12-27 (×2): qty 12
  Filled 2016-12-27: qty 2

## 2016-12-27 MED ORDER — CARVEDILOL 12.5 MG PO TABS
12.5000 mg | ORAL_TABLET | Freq: Two times a day (BID) | ORAL | Status: DC
  Administered 2016-12-27 – 2016-12-29 (×5): 12.5 mg via ORAL
  Filled 2016-12-27 (×5): qty 1

## 2016-12-27 MED ORDER — FENTANYL CITRATE (PF) 100 MCG/2ML IJ SOLN
50.0000 ug | Freq: Once | INTRAMUSCULAR | Status: AC
  Administered 2016-12-27: 50 ug via INTRAVENOUS
  Filled 2016-12-27: qty 2

## 2016-12-27 MED ORDER — FUROSEMIDE 40 MG PO TABS: 40.0000 mg | ORAL_TABLET | Freq: Every day | ORAL | Status: DC

## 2016-12-27 MED ORDER — ACETAMINOPHEN 325 MG PO TABS: 650.0000 mg | ORAL_TABLET | Freq: Four times a day (QID) | ORAL | Status: DC | PRN

## 2016-12-27 MED ORDER — GABAPENTIN 300 MG PO CAPS
300.0000 mg | ORAL_CAPSULE | Freq: Three times a day (TID) | ORAL | Status: DC
  Administered 2016-12-27 – 2016-12-28 (×6): 300 mg via ORAL
  Filled 2016-12-27 (×7): qty 1

## 2016-12-27 MED ORDER — CALCIUM ACETATE (PHOS BINDER) 667 MG PO CAPS
667.0000 mg | ORAL_CAPSULE | Freq: Three times a day (TID) | ORAL | Status: DC
  Administered 2016-12-27 – 2016-12-29 (×8): 667 mg via ORAL
  Filled 2016-12-27 (×9): qty 1

## 2016-12-27 MED ORDER — INSULIN GLARGINE 100 UNIT/ML ~~LOC~~ SOLN
20.0000 [IU] | Freq: Every day | SUBCUTANEOUS | Status: DC
  Administered 2016-12-27 – 2016-12-28 (×2): 20 [IU] via SUBCUTANEOUS
  Filled 2016-12-27 (×3): qty 0.2

## 2016-12-27 MED ORDER — OXYCODONE HCL 5 MG PO TABS
5.0000 mg | ORAL_TABLET | Freq: Four times a day (QID) | ORAL | Status: DC | PRN
  Administered 2016-12-27 – 2016-12-29 (×6): 5 mg via ORAL
  Filled 2016-12-27 (×6): qty 1

## 2016-12-27 MED ORDER — LINAGLIPTIN 5 MG PO TABS
5.0000 mg | ORAL_TABLET | Freq: Every day | ORAL | Status: DC
  Administered 2016-12-27 – 2016-12-29 (×3): 5 mg via ORAL
  Filled 2016-12-27 (×3): qty 1

## 2016-12-27 MED ORDER — VANCOMYCIN HCL IN DEXTROSE 1-5 GM/200ML-% IV SOLN
1000.0000 mg | Freq: Once | INTRAVENOUS | Status: AC
  Administered 2016-12-27: 1000 mg via INTRAVENOUS
  Filled 2016-12-27: qty 200

## 2016-12-27 MED ORDER — HEPARIN SODIUM (PORCINE) 5000 UNIT/ML IJ SOLN
5000.0000 [IU] | Freq: Three times a day (TID) | INTRAMUSCULAR | Status: DC
  Administered 2016-12-27 – 2016-12-29 (×7): 5000 [IU] via SUBCUTANEOUS
  Filled 2016-12-27 (×7): qty 1

## 2016-12-27 MED ORDER — ACETAMINOPHEN 650 MG RE SUPP: 650.0000 mg | Freq: Four times a day (QID) | RECTAL | Status: DC | PRN

## 2016-12-27 MED ORDER — INSULIN ASPART 100 UNIT/ML ~~LOC~~ SOLN
0.0000 [IU] | Freq: Three times a day (TID) | SUBCUTANEOUS | Status: DC
  Administered 2016-12-27: 1 [IU] via SUBCUTANEOUS
  Administered 2016-12-28 – 2016-12-29 (×3): 2 [IU] via SUBCUTANEOUS
  Administered 2016-12-29: 1 [IU] via SUBCUTANEOUS

## 2016-12-27 MED ORDER — PIPERACILLIN-TAZOBACTAM 3.375 G IVPB 30 MIN
3.3750 g | Freq: Once | INTRAVENOUS | Status: AC
  Administered 2016-12-27: 3.375 g via INTRAVENOUS
  Filled 2016-12-27: qty 50

## 2016-12-27 MED ORDER — ONDANSETRON HCL 4 MG/2ML IJ SOLN
4.0000 mg | Freq: Once | INTRAMUSCULAR | Status: AC
  Administered 2016-12-27: 4 mg via INTRAVENOUS
  Filled 2016-12-27: qty 2

## 2016-12-27 MED ORDER — ONDANSETRON HCL 4 MG/2ML IJ SOLN: 4.0000 mg | Freq: Four times a day (QID) | INTRAMUSCULAR | Status: DC | PRN

## 2016-12-27 MED ORDER — GLIPIZIDE ER 10 MG PO TB24
10.0000 mg | ORAL_TABLET | Freq: Every day | ORAL | Status: DC
  Administered 2016-12-27: 10 mg via ORAL
  Filled 2016-12-27: qty 1

## 2016-12-27 MED ORDER — ONDANSETRON HCL 4 MG PO TABS: 4.0000 mg | ORAL_TABLET | Freq: Four times a day (QID) | ORAL | Status: DC | PRN

## 2016-12-27 MED ORDER — FOLIC ACID 1 MG PO TABS
1.0000 mg | ORAL_TABLET | Freq: Every day | ORAL | Status: DC
  Administered 2016-12-27 – 2016-12-29 (×3): 1 mg via ORAL
  Filled 2016-12-27 (×3): qty 1

## 2016-12-27 NOTE — Progress Notes (Signed)
Patient seen and examined at bedside, patient admitted after midnight, please see earlier detailed admission note by Rise Patience, MD. Briefly, patient presented with left arm pain and dyspnea and found to meet sepsis criteria. Vascular surgery consulted for possible graft infection with recommendations to watch for now. Nephrology consulted for ESRD.   Cordelia Poche, MD [facility] 12/27/2016, 2:51 PM Pager: [phone]

## 2016-12-27 NOTE — Progress Notes (Signed)
Pt. Blood pressure 180/80 manual in rt. Leg. MD notified PO amlodipine and Carvedilol given. "2V03. Pt. has BP of 180/80. Giving scheduled amlodipine and carvedilol. "

## 2016-12-27 NOTE — H&P (Signed)
History and Physical    [name] CBJ:[id number] DOB: 1951-05-14 DOA: 12/27/2016  PCP: Nolene Ebbs, MD  Patient coming from: Home.  Chief Complaint: Left arm pain and shortness of breath.  HPI: [name] is a 66 y.o. male with history of chronic kidney disease stage IV to 5, diabetes mellitus, hypertension, sickle cell anemia who had AV graft placed last month June 20 by Dr. Trula Slade presents to the ER because of increasing pain and swelling of the left arm with fever and chills. Patient also felt that he's been having shortness of breath last 2 days. Denies any chest pain or productive cough. Patient had gone to Dr. Stephens Shire office 2 days ago for the follow-up of his left AV graft.   ED Course: In the ER patient was mildly hypoxic chest x-ray shows congestion and eye exam patient has left forearm swelling with warmth and mild erythema. Blood cultures are obtained and patient was started on empiric antibiotics for possible sepsis. Hemoglobin is around 6. Patient's hemoglobin is usually around 7-8.  Review of Systems: As per HPI, rest all negative.   Past Medical History:  Diagnosis Date  . Diabetes mellitus   . GERD (gastroesophageal reflux disease)   . Heart murmur   . Hypertension   . Pneumonia   . Renal insufficiency   . Sickle cell anemia (HCC)    Hgb SS disease     Past Surgical History:  Procedure Laterality Date  . AV FISTULA PLACEMENT Left 11/22/2016   Procedure: INSERTION OF ARTERIOVENOUS (AV) GORE-TEX GRAFT  LEFT FOREARM USING 6MM X 50CM GORETEX GRAFT;  Surgeon: Serafina Mitchell, MD;  Location: [place];  Service: Vascular;  Laterality: Left;  . CATARACT EXTRACTION W/ INTRAOCULAR LENS  IMPLANT, BILATERAL    . CHOLECYSTECTOMY    . COLON SURGERY     Colon cancer surgery, removed small amt not full colectomy  . COLONOSCOPY     nclear who follows him for this  . LEG SURGERY     For ? gangrene after running into barbwire fence at 66yo     reports that he quit  smoking about 50 years ago. He has a 10.00 pack-year smoking history. He has never used smokeless tobacco. He reports that he drinks alcohol. He reports that he uses drugs, including Cocaine.  Allergies  Allergen Reactions  . Morphine And Related Itching    Family History  Problem Relation Age of Onset  . Venous thrombosis Mother        Dec. of blood clot   . Diabetes type II Mother   . AAA (abdominal aortic aneurysm) Mother   . Throat cancer Father        Deceased of throat cancer    Prior to Admission medications   Medication Sig Start Date End Date Taking? Authorizing Provider  acetaminophen (TYLENOL) 500 MG tablet Take 500 mg by mouth every 6 (six) hours as needed for headache (pain).   Yes [provider]  amLODipine (NORVASC) 10 MG tablet Take 1 tablet (10 mg total) by mouth daily. 06/07/16  Yes Leana Gamer, MD  calcium acetate (PHOSLO) 667 MG tablet Take 667 mg by mouth 3 (three) times daily. 09/26/16  Yes [provider]  carvedilol (COREG) 12.5 MG tablet Take 12.5 mg by mouth 2 (two) times daily. 09/18/16  Yes [provider]  Docusate Calcium (STOOL SOFTENER PO) Take 1 capsule by mouth daily as needed (constipation).   Yes [provider]  folic acid (FOLVITE) 1 MG tablet Take 1 tablet (1 mg total) by mouth daily. 04/18/13  Yes Robbie Lis, MD  furosemide (LASIX) 40 MG tablet Take 1 tablet (40 mg total) by mouth daily. 06/07/16  Yes Leana Gamer, MD  gabapentin (NEURONTIN) 300 MG capsule Take 300 mg by mouth 3 (three) times daily.   Yes [provider]  glipiZIDE (GLUCOTROL XL) 10 MG 24 hr tablet Take 10 mg by mouth daily with breakfast.    Yes [provider]  insulin aspart (NOVOLOG FLEXPEN) 100 UNIT/ML FlexPen Inject 2-6 Units into the skin 3 (three) times daily as needed for high blood sugar (CBG >150).   Yes [provider]  insulin glargine (LANTUS) 100 unit/mL SOPN Inject 20 Units into the skin  at bedtime.    Yes [provider]  omeprazole (PRILOSEC) 20 MG capsule Take 20 mg by mouth daily.    Yes [provider]  oxyCODONE (ROXICODONE) 5 MG immediate release tablet Take 1 tablet (5 mg total) by mouth every 6 (six) hours as needed. Patient taking differently: Take 5 mg by mouth every 6 (six) hours as needed for moderate pain.  11/22/16  Yes Rhyne, Samantha J, PA-C  polyethylene glycol powder (MIRALAX) powder Take 17 g by mouth daily. Patient taking differently: Take 17 g by mouth daily as needed (constipation). Mix in 8 oz liquid and drink 10/28/16  Yes Palumbo, April, MD  saxagliptin HCl (ONGLYZA) 5 MG TABS tablet Take 5 mg by mouth daily.   Yes [provider]  Sennosides (SENNA LAXATIVE PO) Take 1 tablet by mouth daily as needed (constipation).   Yes [provider]    Physical Exam: Vitals:   12/27/16 0135 12/27/16 0315 12/27/16 0415 12/27/16 0421  BP:  (!) 170/78 (!) 159/69   Pulse:  85 81   Resp:  18 17   Temp: 100.2 F (37.9 C)   99.9 F (37.7 C)  TempSrc: Oral   Oral  SpO2:  98% 97%       Constitutional: Moderately built and nourished. Vitals:   12/27/16 0135 12/27/16 0315 12/27/16 0415 12/27/16 0421  BP:  (!) 170/78 (!) 159/69   Pulse:  85 81   Resp:  18 17   Temp: 100.2 F (37.9 C)   99.9 F (37.7 C)  TempSrc: Oral   Oral  SpO2:  98% 97%    Eyes: Anicteric mild pallor. ENMT: No discharge from the ears eyes nose and mouth. Neck: No neck rigidity. No mass felt. Respiratory: No rhonchi or crepitations. Cardiovascular: S1 and S2 heard no murmurs appreciated. Abdomen: Soft nontender bowel sounds present.    Musculoskeletal: No edema. Skin: No rash. Neurologic: Alert awake oriented to time place and person. Moves all extremities. Psychiatric: Appears normal. Normal affect.   Labs on Admission: I have personally reviewed following labs and imaging studies  CBC:  Recent Labs Lab 12/26/16 2230  WBC 21.3*  NEUTROABS  15.3*  HGB 6.7*  HCT 21.1*  MCV 66.8*  PLT 761   Basic Metabolic Panel:  Recent Labs Lab 12/26/16 2230  NA 136  K 4.6  CL 100*  CO2 26  GLUCOSE 192*  BUN 45*  CREATININE 4.83*  CALCIUM 9.5   GFR: Estimated Creatinine Clearance: 16.5 mL/min (A) (by C-G formula based on SCr of 4.83 mg/dL (H)). Liver Function Tests:  Recent Labs Lab 12/26/16 2230  AST 16  ALT 8*  ALKPHOS 75  BILITOT 1.6*  PROT 7.8  ALBUMIN 3.7   No results for input(s): LIPASE, AMYLASE in the last 168 hours. No results for input(s): AMMONIA in the last 168 hours. Coagulation Profile:  Recent Labs Lab 12/26/16 2230  INR 1.02   Cardiac Enzymes: No results for input(s): CKTOTAL, CKMB, CKMBINDEX, TROPONINI in the last 168 hours. BNP (last 3 results) No results for input(s): PROBNP in the last 8760 hours. HbA1C: No results for input(s): HGBA1C in the last 72 hours. CBG:  Recent Labs Lab 12/27/16 0208  GLUCAP 112*   Lipid Profile: No results for input(s): CHOL, HDL, LDLCALC, TRIG, CHOLHDL, LDLDIRECT in the last 72 hours. Thyroid Function Tests: No results for input(s): TSH, T4TOTAL, FREET4, T3FREE, THYROIDAB in the last 72 hours. Anemia Panel: No results for input(s): VITAMINB12, FOLATE, FERRITIN, TIBC, IRON, RETICCTPCT in the last 72 hours. Urine analysis:    Component Value Date/Time   COLORURINE YELLOW 12/27/2016 [place] 12/27/2016 0318   LABSPEC 1.012 12/27/2016 0318   PHURINE 5.0 12/27/2016 0318   GLUCOSEU NEGATIVE 12/27/2016 0318   HGBUR NEGATIVE 12/27/2016 0318   BILIRUBINUR NEGATIVE 12/27/2016 0318   KETONESUR NEGATIVE 12/27/2016 0318   PROTEINUR 100 (A) 12/27/2016 0318   UROBILINOGEN 1.0 09/06/2014 2203   NITRITE NEGATIVE 12/27/2016 0318   LEUKOCYTESUR NEGATIVE 12/27/2016 0318   Sepsis Labs: @LABRCNTIP (procalcitonin:4,lacticidven:4) )No results found for this or any previous visit (from the past 240 hour(s)).   Radiological Exams on Admission: Dg  Chest 2 View  Result Date: 12/26/2016 CLINICAL DATA:  Cough and fever for 2 days EXAM: CHEST  2 VIEW COMPARISON:  11/08/2016 FINDINGS: Cardiac shadow is mildly enlarged. Mild vascular prominence is again seen and stable. This may be related to a component of volume overload. No focal infiltrate is seen. Sclerotic changes are noted throughout the bony structures which may be related to the underlying renal disease. IMPRESSION: Mild vascular prominence which may be related to volume overload. No focal infiltrate is seen. Sclerotic changes in the bone likely related to the underlying renal disease. Electronically Signed   By: Inez Catalina M.D.   On: 12/26/2016 22:44    EKG: Independently reviewed. Normal sinus rhythm with LBBB.  Assessment/Plan Principal Problem:   Sepsis ([place]) Active Problems:   Type 2 diabetes mellitus with renal complication (HCC)   Chronic kidney disease (CKD), stage IV (severe) (HCC)   Pain and swelling of left forearm    1. Sepsis - source not clear. Suspect left arm. I have discussed with Dr. Trula Slade hole be seeing patient in consult. For now patient will be on empiric antibiotics. Follow blood cultures. 2. Shortness of breath with progressive renal failure - discussed with Dr. Justin Mend on-call nephrologist who advised patient to be on Lasix 120 mg every 12 IV. Follow intake output and metabolic panel. Dr. Rise Paganini seeing patient in consult. 3. Diabetes mellitus type 2 - hold oral hypoglycemics patient is on Lantus closely follow CBGs and sliding scale coverage. 4. Anemia with history of sickle cell disease and ESRD - since patient has concern for volume overload hesitant to transfuse at this time. Follow CBC. 5. Hypertension on amlodipine and Coreg.  I have reviewed patient's old charts and labs. I have discussed with Dr. Trula Slade vascular surgeon and Dr. Justin Mend nephrologist.   DVT prophylaxis: Heparin. Code Status: Full code.  Family Communication: Patient's family at the  bedside.  Disposition Plan: Home.  Consults called: Vascular surgery and nephrology.  Admission status: Inpatient.    Rise Patience MD [facility] Pager [phone]-  [phone].  If 7PM-7AM, please contact night-coverage [email or web address] Password [email or web address]  12/27/2016, 6:01 AM

## 2016-12-27 NOTE — Consult Note (Signed)
[name] Admit Date: 12/27/2016 12/27/2016 Alphonzo Grieve Requesting Physician:  Hal Hope  Reason for Consult:  CKD stage 5 HPI:  [name] is a 66yo male with PMH of CKD 5, T2DM, HTN, sickle cell anemia who has been preparing for dialysis and comes into [facility] with increasing pain ad swelling of left arm (where new graft site is), fevers and chills; also complaints of shortness of breath. Patient had left forearm graft placed on 6/20 and recently followed up with vascular on 7/23 at which time it was deemed to be functioning well with no complications and deemed stable for use when necessary.   On presentation to ED yesterday, patient had stable renal function and electrolytes. CBC showed Hgb 6.7, WBC 21.3 (though appears to be consistently elevated for several years). Chest XR without consolidation, shows stable vascular prominence which may be related to volume overload.  Patient denies NSAID use; no recent IV contrast; no recent antibiotics  PMH Incudes:  CKD stage 5  T2DM  HTN  Sickle cell anemia   Creatinine, Ser (mg/dL)  Date Value  12/27/2016 4.79 (H)  12/26/2016 4.83 (H)  11/11/2016 4.36 (H)  11/09/2016 5.34 (H)  11/08/2016 5.26 (H)  11/07/2016 5.41 (H)  11/06/2016 4.99 (H)  11/05/2016 4.53 (H)  11/04/2016 4.63 (H)  10/27/2016 4.72 (H)   I/Os: none recorded  ROS Balance of 12 systems is negative w/ exceptions as above  PMH  Past Medical History:  Diagnosis Date  . Diabetes mellitus   . GERD (gastroesophageal reflux disease)   . Heart murmur   . Hypertension   . Pneumonia   . Renal insufficiency   . Sickle cell anemia (HCC)    Hgb SS disease    PSH  Past Surgical History:  Procedure Laterality Date  . AV FISTULA PLACEMENT Left 11/22/2016   Procedure: INSERTION OF ARTERIOVENOUS (AV) GORE-TEX GRAFT  LEFT FOREARM USING 6MM X 50CM GORETEX GRAFT;  Surgeon: Serafina Mitchell, MD;  Location: [place];  Service: Vascular;  Laterality: Left;  . CATARACT  EXTRACTION W/ INTRAOCULAR LENS  IMPLANT, BILATERAL    . CHOLECYSTECTOMY    . COLON SURGERY     Colon cancer surgery, removed small amt not full colectomy  . COLONOSCOPY     nclear who follows him for this  . LEG SURGERY     For ? gangrene after running into barbwire fence at 66yo   FH  Family History  Problem Relation Age of Onset  . Venous thrombosis Mother        Dec. of blood clot   . Diabetes type II Mother   . AAA (abdominal aortic aneurysm) Mother   . Throat cancer Father        Deceased of throat cancer   SH  reports that he quit smoking about 50 years ago. He has a 10.00 pack-year smoking history. He has never used smokeless tobacco. He reports that he drinks alcohol. He reports that he uses drugs, including Cocaine. Allergies  Allergies  Allergen Reactions  . Morphine And Related Itching   Home medications Prior to Admission medications   Medication Sig Start Date End Date Taking? Authorizing Provider  acetaminophen (TYLENOL) 500 MG tablet Take 500 mg by mouth every 6 (six) hours as needed for headache (pain).   Yes [provider]  amLODipine (NORVASC) 10 MG tablet Take 1 tablet (10 mg total) by mouth daily. 06/07/16  Yes Leana Gamer, MD  calcium acetate (PHOSLO) 667 MG tablet Take  667 mg by mouth 3 (three) times daily. 09/26/16  Yes [provider]  carvedilol (COREG) 12.5 MG tablet Take 12.5 mg by mouth 2 (two) times daily. 09/18/16  Yes [provider]  Docusate Calcium (STOOL SOFTENER PO) Take 1 capsule by mouth daily as needed (constipation).   Yes [provider]  folic acid (FOLVITE) 1 MG tablet Take 1 tablet (1 mg total) by mouth daily. 04/18/13  Yes Robbie Lis, MD  furosemide (LASIX) 40 MG tablet Take 1 tablet (40 mg total) by mouth daily. 06/07/16  Yes Leana Gamer, MD  gabapentin (NEURONTIN) 300 MG capsule Take 300 mg by mouth 3 (three) times daily.   Yes [provider]  glipiZIDE (GLUCOTROL XL) 10  MG 24 hr tablet Take 10 mg by mouth daily with breakfast.    Yes [provider]  insulin aspart (NOVOLOG FLEXPEN) 100 UNIT/ML FlexPen Inject 2-6 Units into the skin 3 (three) times daily as needed for high blood sugar (CBG >150).   Yes [provider]  insulin glargine (LANTUS) 100 unit/mL SOPN Inject 20 Units into the skin at bedtime.    Yes [provider]  omeprazole (PRILOSEC) 20 MG capsule Take 20 mg by mouth daily.    Yes [provider]  oxyCODONE (ROXICODONE) 5 MG immediate release tablet Take 1 tablet (5 mg total) by mouth every 6 (six) hours as needed. Patient taking differently: Take 5 mg by mouth every 6 (six) hours as needed for moderate pain.  11/22/16  Yes Rhyne, Samantha J, PA-C  polyethylene glycol powder (MIRALAX) powder Take 17 g by mouth daily. Patient taking differently: Take 17 g by mouth daily as needed (constipation). Mix in 8 oz liquid and drink 10/28/16  Yes Palumbo, April, MD  saxagliptin HCl (ONGLYZA) 5 MG TABS tablet Take 5 mg by mouth daily.   Yes [provider]  Sennosides (SENNA LAXATIVE PO) Take 1 tablet by mouth daily as needed (constipation).   Yes [provider]    Current Medications Scheduled Meds: . amLODipine  10 mg Oral Daily  . calcium acetate  667 mg Oral TID WC  . carvedilol  12.5 mg Oral BID  . folic acid  1 mg Oral Daily  . gabapentin  300 mg Oral TID  . glipiZIDE  10 mg Oral Q breakfast  . heparin  5,000 Units Subcutaneous Q8H  . insulin glargine  20 Units Subcutaneous QHS  . linagliptin  5 mg Oral Daily  . pantoprazole  40 mg Oral Daily   Continuous Infusions: . furosemide Stopped (12/27/16 0957)   PRN Meds:.acetaminophen **OR** acetaminophen, ondansetron **OR** ondansetron (ZOFRAN) IV, oxyCODONE, polyethylene glycol  CBC  Recent Labs Lab 12/26/16 2230 12/27/16 0824  WBC 21.3* 22.4*  NEUTROABS 15.3* 14.8*  HGB 6.7* 7.4*  HCT 21.1* 21.9*  MCV 66.8* 65.8*  PLT 319 341    Basic Metabolic Panel  Recent Labs Lab 12/26/16 2230 12/27/16 0824  NA 136 136  K 4.6 5.0  CL 100* 101  CO2 26 25  GLUCOSE 192* 134*  BUN 45* 46*  CREATININE 4.83* 4.79*  CALCIUM 9.5 9.2    Physical Exam  Blood pressure (!) 180/80, pulse 84, temperature 100.2 F (37.9 C), temperature source Oral, resp. rate 18, height 6\' 1"  (1.854 m), weight 189 lb 6.4 oz (85.9 kg), SpO2 99 %. GEN: NAD, pleasant ENT: moist mucous membranes EYES: anicteric CV: RRR, no murmurs, rubs or gallops appreciated, mild LE edema, pulses intact PULM: diminished  breath sounds throughout, no wheezing, no rales appreciated ABD: soft, NDNT SKIN: no rashes or lesions NID:POEUM all 4 extremities freely; L forearm graft with palpable thrill   Assessment [name] is a 65yo male with CKD stage 5 and recent LUE graft placement who presents with graft site swelling, warmth, fevers, chills and shortness of breath. 1. CKD stage 5 - stable renal function and electrolytes. Mild volume overload, sating well on RA - but no indication for urgent dialysis at this time.  2. LUE graft - vascular on board - do not think there is a graft site infection 3. Leukocytosis - seems to be pretty consistent over the years though; s/p one dose of vanc/zocyn on arrival  4. T2DM 5. HTN  Plan 1. Serial renal panels 2. No indication for urgent dialysis right now, however will follow if need arises 3. Fluid restriction 1571ml, strict ins/outs, daily weights, Na restriction 4. Continue phoslo 5. Rest per primary   Alphonzo Grieve, MD PGY - 2 12/27/2016, 12:34 PM

## 2016-12-27 NOTE — ED Notes (Signed)
Admitting Provider at bedside. 

## 2016-12-27 NOTE — ED Notes (Addendum)
Pt's wife reports fever for the last couple of days. Wife reports giving tylenol every 6 hours with minimal relief. Highest fever at home was 102. Pt has redness to forearm, hot to touch.

## 2016-12-27 NOTE — Progress Notes (Signed)
    Subjective  -   The patient is status post left forearm loop graft for dialysis on 11/22/2016.  He has stage V renal disease.  He was seen in the office Monday.  He was febrile but his incisions and the left forearm did not appear to be infected.  He called me last night and was complaining of shortness of breath and persistent fevers and was told to come to the emergency department.  His white count was elevated admitted.   Physical Exam:  There is a palpable thrill within his forearm graft.  All incisions are healed.  There is some warmth over the graft, however I do not see any significant erythema.         Assessment/Plan:    Currently, there is no convincing evidence that his graft is infected.  He is exhibiting typical postoperative changes from graft placement.  I recommend IV antibiotics and full fever workup.  If he does not improve and all other sources of infection have been excluded, consideration for graft removal would be made.  However at this time I'm hopeful that this does not need to be done.  We will continue to follow the patient while he is in the hospital.  Annamarie Major 12/27/2016 8:57 AM --  Vitals:   12/27/16 0819 12/27/16 0841  BP: (!) 185/70 (!) 180/80  Pulse: 84   Resp: 18   Temp: 100.2 F (37.9 C)    No intake or output data in the 24 hours ending 12/27/16 0857   Laboratory CBC    Component Value Date/Time   WBC 21.3 (H) 12/26/2016 2230   HGB 6.7 (LL) 12/26/2016 2230   HCT 21.1 (L) 12/26/2016 2230   HCT 14.3 (LL) 11/07/2016 1518   PLT 319 12/26/2016 2230    BMET    Component Value Date/Time   NA 136 12/26/2016 2230   K 4.6 12/26/2016 2230   CL 100 (L) 12/26/2016 2230   CO2 26 12/26/2016 2230   GLUCOSE 192 (H) 12/26/2016 2230   BUN 45 (H) 12/26/2016 2230   CREATININE 4.83 (H) 12/26/2016 2230   CALCIUM 9.5 12/26/2016 2230   GFRNONAA 11 (L) 12/26/2016 2230   GFRAA 13 (L) 12/26/2016 2230    COAG Lab Results  Component Value  Date   INR 1.02 12/26/2016   INR 1.13 09/03/2014   INR 1.09 07/27/2011   No results found for: PTT  Antibiotics Anti-infectives    Start     Dose/Rate Route Frequency Ordered Stop   12/27/16 0200  vancomycin (VANCOCIN) IVPB 1000 mg/200 mL premix     1,000 mg 200 mL/hr over 60 Minutes Intravenous  Once 12/27/16 0150 12/27/16 0418   12/27/16 0200  piperacillin-tazobactam (ZOSYN) IVPB 3.375 g     3.375 g 100 mL/hr over 30 Minutes Intravenous  Once 12/27/16 0150 12/27/16 0330       V. Leia Alf, M.D. [facility] Office: [phone] Pager:  [phone]

## 2016-12-27 NOTE — ED Provider Notes (Signed)
By signing my name below, I, Ephriam Jenkins, attest that this documentation has been prepared under the direction and in the presence of Ward, Delice Bison, DO. Electronically signed, Ephriam Jenkins, ED Scribe. 12/27/16. 1:54 AM.  TIME SEEN: 1:40 AM  CHIEF COMPLAINT: Fever, Left forearm pain   HPI:  HPI Comments: [name] is a 66 y.o. male with Hx of sickle cell anemia, HTN, DM, who presents to the Emergency Department in concerns for fever and left forearm pain and swelling that started yesterday. Pt started to have a low grade fever yesterday and was seen routinely by his vascular surgeon after having AV graft placed in left forearm on 11/22/16. Yesterday pt started to have pain near the graft site after his visit. Pt's wife has been giving the pt Tylenol q6h since spiking the fever which has had no success in relieving fever or symptoms. He is not yet on dialysis. Pt also notes he has had intermittent shortness of breath and dry cough recently.Marland Kitchen He has had mild wheezing intermittently. Pt still makes urine but only small amounts. Pt's Hgb is 6.7 here today, states that it normally runs ~7.  States he recently was transfused. Denies hematochezia or melena.  No vomiting or diarrhea. No abdominal pain. No recent travel outside of the country. No sick contacts.  ROS: See HPI Constitutional: + fever  Eyes: no drainage  ENT: no runny nose   Cardiovascular:  no chest pain  Resp: + SOB  GI: no vomiting GU: no dysuria Integumentary: no rash  Allergy: no hives  Musculoskeletal: no leg swelling  Neurological: no slurred speech ROS otherwise negative  PAST MEDICAL HISTORY/PAST SURGICAL HISTORY:  Past Medical History:  Diagnosis Date  . Diabetes mellitus   . GERD (gastroesophageal reflux disease)   . Heart murmur   . Hypertension   . Pneumonia   . Renal insufficiency   . Sickle cell anemia (HCC)    Hgb SS disease     MEDICATIONS:  Prior to Admission medications   Medication Sig Start Date  End Date Taking? Authorizing Provider  acetaminophen (TYLENOL) 500 MG tablet Take 500 mg by mouth every 6 (six) hours as needed for headache (pain).    [provider]  amLODipine (NORVASC) 10 MG tablet Take 1 tablet (10 mg total) by mouth daily. 06/07/16   Leana Gamer, MD  calcium acetate (PHOSLO) 667 MG tablet Take 667 mg by mouth 3 (three) times daily. 09/26/16   [provider]  carvedilol (COREG) 12.5 MG tablet Take 12.5 mg by mouth 2 (two) times daily. 09/18/16   [provider]  Docusate Calcium (STOOL SOFTENER PO) Take 1 capsule by mouth daily as needed (constipation).    [provider]  folic acid (FOLVITE) 1 MG tablet Take 1 tablet (1 mg total) by mouth daily. 04/18/13   Robbie Lis, MD  furosemide (LASIX) 40 MG tablet Take 1 tablet (40 mg total) by mouth daily. 06/07/16   Leana Gamer, MD  gabapentin (NEURONTIN) 300 MG capsule Take 300 mg by mouth 3 (three) times daily.    [provider]  glipiZIDE (GLUCOTROL XL) 10 MG 24 hr tablet Take 10 mg by mouth daily with breakfast.     [provider]  insulin aspart (NOVOLOG FLEXPEN) 100 UNIT/ML FlexPen Inject 2-6 Units into the skin 3 (three) times daily as needed for high blood sugar (CBG >150).    [provider]  insulin glargine (LANTUS) 100 unit/mL SOPN Inject 20  Units into the skin at bedtime.     [provider]  omeprazole (PRILOSEC) 20 MG capsule Take 20 mg by mouth daily.     [provider]  oxyCODONE (ROXICODONE) 5 MG immediate release tablet Take 1 tablet (5 mg total) by mouth every 6 (six) hours as needed. 11/22/16   Rhyne, Hulen Shouts, PA-C  polyethylene glycol powder (MIRALAX) powder Take 17 g by mouth daily. Patient taking differently: Take 17 g by mouth daily as needed (constipation). Mix in 8 oz liquid and drink 10/28/16   Palumbo, April, MD  saxagliptin HCl (ONGLYZA) 5 MG TABS tablet Take 5 mg by mouth daily.    [provider]  Sennosides (SENNA LAXATIVE PO) Take 1 tablet by mouth daily as needed (constipation).    [provider]    ALLERGIES:  Allergies  Allergen Reactions  . Morphine And Related Itching    SOCIAL HISTORY:  Social History  Substance Use Topics  . Smoking status: Former Smoker    Packs/day: 0.50    Years: 20.00    Quit date: 06/05/1966  . Smokeless tobacco: Never Used  . Alcohol use Yes     Comment: rare beer    FAMILY HISTORY: Family History  Problem Relation Age of Onset  . Venous thrombosis Mother        Dec. of blood clot   . Diabetes type II Mother   . AAA (abdominal aortic aneurysm) Mother   . Throat cancer Father        Deceased of throat cancer    EXAM: BP (!) 151/63   Pulse 79   Temp 100.2 F (37.9 C) (Oral)   Resp 18   SpO2 100%  CONSTITUTIONAL: Alert and oriented and responds appropriately to questions. Febrile, chronically ill-appearing, in no distress, nontoxic HEAD: Normocephalic EYES: Conjunctivae clear, pupils appear equal, EOMI ENT: normal nose; moist mucous membranes; no tonsillar hypertrophy or exudate, no trismus or drooling, normal phonation, no uvular deviation NECK: Supple, no meningismus, no nuchal rigidity, no LAD  CARD: RRR; S1 and S2 appreciated; no murmurs, no clicks, no rubs, no gallops RESP: Normal chest excursion without splinting or tachypnea; breath sounds clear and equal bilaterally; no wheezes, no rhonchi, no rales, no hypoxia or respiratory distress, speaking full sentences ABD/GI: Normal bowel sounds; non-distended; soft, non-tender, no rebound, no guarding, no peritoneal signs, no hepatosplenomegaly BACK:  The back appears normal and is non-tender to palpation, there is no CVA tenderness EXT: Patient has an AV graft in the left forearm with normal thrill and bruit and a 2+ radial pulse on the left side. Compartments throughout the left arm are soft. He is tender over this area with erythema and warmth but no fluctuance or  induration. No bony deformity throughout the left arm. Normal grip strength bilaterally. Normal ROM in all joints; otherwise extremities are non-tender to palpation; no edema; normal capillary refill; no cyanosis, no calf tenderness or swelling    SKIN: Normal color for age and race; warm; no rash NEURO: Moves all extremities equally PSYCH: The patient's mood and manner are appropriate. Grooming and personal hygiene are appropriate.  MEDICAL DECISION MAKING: Patient here with fever and cellulitis to the left upper extremity over his AV graft site. This site has not been accessed as he is not yet on dialysis.  He has a leukocytosis but this does appear to be chronic for the patient. He has a hemoglobin of 6.7 but has a history of sickle cell and states  that his hemoglobin is normally around 7 and he does not get transfuse until it is less than 5. He has shortness of breath and cough but no chest pain. His chest x-ray shows no infiltrate. Will give broad-spectrum antibiotics, vancomycin and Zosyn. We'll give pain medication. I feel he will need admission and likely see vascular surgery in the morning. Nothing at this time to suggest acute arterial obstruction. He has no history of DVT.  He does not appear septic at this time. His lactate is normal. We have sent blood cultures. I do not feel this time he needs significant IV fluids as he is not hypotensive and we could put him at risk for volume overload. We will monitor his vital signs closely.  ED PROGRESS:    1:58 AM Discussed patient's case with hospitalist, Dr. Hal Hope.  I have recommended admission and patient (and family if present) agree with this plan. Admitting physician will place admission orders.   I reviewed all nursing notes, vitals, pertinent previous records, EKGs, lab and urine results, imaging (as available).     EKG Interpretation  Date/Time:  Tuesday December 26 2016 22:09:14 EDT Ventricular Rate:  92 PR Interval:  168 QRS  Duration: 146 QT Interval:  372 QTC Calculation: 460 R Axis:   35 Text Interpretation:  Normal sinus rhythm Left bundle branch block Abnormal ECG No significant change since last tracing Confirmed by Orlie Dakin [phone]) on 12/26/2016 10:15:14 PM       This chart was scribed in my presence and reviewed by me personally.    Ward, Delice Bison, DO 12/27/16 [phone]

## 2016-12-28 DIAGNOSIS — M7989 Other specified soft tissue disorders: Secondary | ICD-10-CM

## 2016-12-28 DIAGNOSIS — L03114 Cellulitis of left upper limb: Secondary | ICD-10-CM

## 2016-12-28 DIAGNOSIS — M79632 Pain in left forearm: Secondary | ICD-10-CM

## 2016-12-28 DIAGNOSIS — E1121 Type 2 diabetes mellitus with diabetic nephropathy: Secondary | ICD-10-CM

## 2016-12-28 DIAGNOSIS — N184 Chronic kidney disease, stage 4 (severe): Secondary | ICD-10-CM

## 2016-12-28 LAB — CBC WITH DIFFERENTIAL/PLATELET
BASOS ABS: 0 10*3/uL (ref 0.0–0.1)
BASOS PCT: 0 %
Band Neutrophils: 0 %
Blasts: 0 %
Eosinophils Absolute: 1 10*3/uL — ABNORMAL HIGH (ref 0.0–0.7)
Eosinophils Relative: 5 %
HCT: 20.6 % — ABNORMAL LOW (ref 39.0–52.0)
HEMOGLOBIN: 6.7 g/dL — AB (ref 13.0–17.0)
Lymphocytes Relative: 19 %
Lymphs Abs: 3.7 10*3/uL (ref 0.7–4.0)
MCH: 21.5 pg — AB (ref 26.0–34.0)
MCHC: 32.5 g/dL (ref 30.0–36.0)
MCV: 66.2 fL — ABNORMAL LOW (ref 78.0–100.0)
MONO ABS: 1.7 10*3/uL — AB (ref 0.1–1.0)
MYELOCYTES: 0 %
Metamyelocytes Relative: 0 %
Monocytes Relative: 9 %
NEUTROS PCT: 67 %
NRBC: 1 /100{WBCs} — AB
Neutro Abs: 13 10*3/uL — ABNORMAL HIGH (ref 1.7–7.7)
Other: 0 %
PROMYELOCYTES ABS: 0 %
Platelets: 269 10*3/uL (ref 150–400)
RBC: 3.11 MIL/uL — ABNORMAL LOW (ref 4.22–5.81)
RDW: 19.5 % — ABNORMAL HIGH (ref 11.5–15.5)
WBC: 19.4 10*3/uL — AB (ref 4.0–10.5)

## 2016-12-28 LAB — BASIC METABOLIC PANEL
Anion gap: 9 (ref 5–15)
BUN: 51 mg/dL — ABNORMAL HIGH (ref 6–20)
CALCIUM: 9.5 mg/dL (ref 8.9–10.3)
CO2: 28 mmol/L (ref 22–32)
CREATININE: 5.26 mg/dL — AB (ref 0.61–1.24)
Chloride: 96 mmol/L — ABNORMAL LOW (ref 101–111)
GFR calc non Af Amer: 10 mL/min — ABNORMAL LOW (ref >60)
GFR, EST AFRICAN AMERICAN: 12 mL/min — AB (ref >60)
Glucose, Bld: 100 mg/dL — ABNORMAL HIGH (ref 65–99)
Potassium: 4.5 mmol/L (ref 3.5–5.1)
Sodium: 133 mmol/L — ABNORMAL LOW (ref 135–145)

## 2016-12-28 LAB — HEMOGLOBIN AND HEMATOCRIT, BLOOD
HCT: 24.5 % — ABNORMAL LOW (ref 39.0–52.0)
HEMOGLOBIN: 8.1 g/dL — AB (ref 13.0–17.0)

## 2016-12-28 LAB — GLUCOSE, CAPILLARY
Glucose-Capillary: 160 mg/dL — ABNORMAL HIGH (ref 65–99)
Glucose-Capillary: 118 mg/dL — ABNORMAL HIGH (ref 65–99)
Glucose-Capillary: 172 mg/dL — ABNORMAL HIGH (ref 65–99)
Glucose-Capillary: 101 mg/dL — ABNORMAL HIGH (ref 65–99)

## 2016-12-28 LAB — PREPARE RBC (CROSSMATCH)

## 2016-12-28 LAB — LACTIC ACID, PLASMA: Lactic Acid, Venous: 0.5 mmol/L (ref 0.5–1.9)

## 2016-12-28 MED ORDER — PIPERACILLIN-TAZOBACTAM IN DEX 2-0.25 GM/50ML IV SOLN
2.2500 g | Freq: Four times a day (QID) | INTRAVENOUS | Status: DC
  Administered 2016-12-28 – 2016-12-29 (×6): 2.25 g via INTRAVENOUS
  Filled 2016-12-28 (×8): qty 50

## 2016-12-28 MED ORDER — VANCOMYCIN HCL IN DEXTROSE 1-5 GM/200ML-% IV SOLN: 1000.0000 mg | Freq: Once | INTRAVENOUS | Status: DC

## 2016-12-28 MED ORDER — VANCOMYCIN HCL IN DEXTROSE 1-5 GM/200ML-% IV SOLN
1000.0000 mg | INTRAVENOUS | Status: DC
  Administered 2016-12-29: 1000 mg via INTRAVENOUS
  Filled 2016-12-28: qty 200

## 2016-12-28 MED ORDER — SODIUM CHLORIDE 0.9 % IV SOLN
Freq: Once | INTRAVENOUS | Status: AC
  Administered 2016-12-28: 04:00:00 via INTRAVENOUS

## 2016-12-28 NOTE — Progress Notes (Signed)
Notified Baltazar Najjar, NP that pt received 1 dose of vancomycin and zosyn in ED at 0200 7/25 and has had no other orders written for antibiotics. Will continue to monitor pt and await any orders. Ranelle Oyster, RN

## 2016-12-28 NOTE — Plan of Care (Signed)
Problem: Safety: Goal: Ability to remain free from injury will improve Outcome: Progressing Pt will be free from falls and injuries during this hospitalization.  Problem: Education: Goal: Knowledge of disease and its progression will improve Outcome: Progressing Pt's knowledge of disease process will improved prior to discharge.

## 2016-12-28 NOTE — Progress Notes (Signed)
PROGRESS NOTE    [name]  TWS:[id number] DOB: 03/10/1951 DOA: 12/27/2016 PCP: Nolene Ebbs, MD   Brief Narrative: [name] is a 66 y.o. male with history of CKD 4, diabetes mellitus, essential hypertension, sickle cell disease. He presents with left arm pain and dyspnea and found to have fevers concerning for sepsis. He is started on broad-spectrum antibiotics, vancomycin and Zosyn. Blood and urine cultures pending.   Assessment & Plan:   Principal Problem:   Sepsis ([place]) Active Problems:   Type 2 diabetes mellitus with renal complication (HCC)   Chronic kidney disease (CKD), stage IV (severe) (HCC)   Pain and swelling of left forearm   SIRS Unknown source but possibly secondary to graft. So far workup has been negative. Febrile overnight -Continue vancomycin and Zosyn I think blood cultures pending -Obtain urine culture  Dyspnea Patient started on lasix. Symptoms resolved. -continue Lasix per nephrology recommendations  Diabetes mellitus, type 2 -continue Lantus -continue SSI  Anemia Sickle cell disease Drop in hemoglobin and received 1u PRBC -threshold for transfusion based on symptoms of sickle cell crisis -intermittent CBC  Essential hypertension -continue amlodipine -continue Coreg  CKD stage 4 -nephrology recommendations   DVT prophylaxis: Heparin Code Status: Full code Family Communication: None at bedside Disposition Plan: Discharge in 48-72 hours   Consultants:   Nephrology  Vascular surgery  Procedures:   None  Antimicrobials:  Vancomycin  Zosyn    Subjective: Patient reports improvement in arm pain. Febrile overnight.  Objective: Vitals:   12/28/16 0422 12/28/16 0451 12/28/16 0623 12/28/16 1502  BP: 140/76  (!) 131/59 (!) 142/80  Pulse: 78  75 74  Resp: 18  16 16   Temp: 99.7 F (37.6 C)  98.4 F (36.9 C) 98.3 F (36.8 C)  TempSrc: Oral  Oral   SpO2: 92%  98% 100%  Weight:  82 kg (180 lb 11.2 oz)      Height:        Intake/Output Summary (Last 24 hours) at 12/28/16 1616 Last data filed at 12/28/16 1310  Gross per 24 hour  Intake              480 ml  Output             1375 ml  Net             -895 ml   Filed Weights   12/27/16 0819 12/28/16 0451  Weight: 85.9 kg (189 lb 6.4 oz) 82 kg (180 lb 11.2 oz)    Examination:  General exam: Appears calm and comfortable Respiratory system: Clear to auscultation. Respiratory effort normal. Cardiovascular system: S1 & S2 heard, RRR. No murmurs. Graft with palpable thrill and audible bruit Gastrointestinal system: Abdomen is nondistended, soft and nontender. Normal bowel sounds heard. Central nervous system: Alert and oriented. No focal neurological deficits. Extremities: No edema. No calf tenderness Skin: No cyanosis. No rashes Psychiatry: Judgement and insight appear normal. Mood & affect appropriate.     Data Reviewed: I have personally reviewed following labs and imaging studies  CBC:  Recent Labs Lab 12/26/16 2230 12/27/16 0824 12/28/16 0153 12/28/16 0735  WBC 21.3* 22.4* 19.4*  --   NEUTROABS 15.3* 14.8* 13.0*  --   HGB 6.7* 7.4* 6.7* 8.1*  HCT 21.1* 21.9* 20.6* 24.5*  MCV 66.8* 65.8* 66.2*  --   PLT 319 252 269  --    Basic Metabolic Panel:  Recent Labs Lab 12/26/16 2230 12/27/16 0824 12/28/16 0153  NA 136 136  133*  K 4.6 5.0 4.5  CL 100* 101 96*  CO2 26 25 28   GLUCOSE 192* 134* 100*  BUN 45* 46* 51*  CREATININE 4.83* 4.79* 5.26*  CALCIUM 9.5 9.2 9.5   GFR: Estimated Creatinine Clearance: 15.2 mL/min (A) (by C-G formula based on SCr of 5.26 mg/dL (H)). Liver Function Tests:  Recent Labs Lab 12/26/16 2230 12/27/16 0824  AST 16 16  ALT 8* 7*  ALKPHOS 75 69  BILITOT 1.6* 1.6*  PROT 7.8 7.6  ALBUMIN 3.7 3.3*   No results for input(s): LIPASE, AMYLASE in the last 168 hours. No results for input(s): AMMONIA in the last 168 hours. Coagulation Profile:  Recent Labs Lab 12/26/16 2230  INR 1.02    Cardiac Enzymes: No results for input(s): CKTOTAL, CKMB, CKMBINDEX, TROPONINI in the last 168 hours. BNP (last 3 results) No results for input(s): PROBNP in the last 8760 hours. HbA1C: No results for input(s): HGBA1C in the last 72 hours. CBG:  Recent Labs Lab 12/27/16 0208 12/27/16 1713 12/27/16 2212 12/28/16 0743 12/28/16 1206  GLUCAP 112* 130* 130* 160* 118*   Lipid Profile: No results for input(s): CHOL, HDL, LDLCALC, TRIG, CHOLHDL, LDLDIRECT in the last 72 hours. Thyroid Function Tests: No results for input(s): TSH, T4TOTAL, FREET4, T3FREE, THYROIDAB in the last 72 hours. Anemia Panel: No results for input(s): VITAMINB12, FOLATE, FERRITIN, TIBC, IRON, RETICCTPCT in the last 72 hours. Sepsis Labs:  Recent Labs Lab 12/26/16 2245 12/27/16 0202 12/28/16 0153  LATICACIDVEN 1.10 1.22 0.5    Recent Results (from the past 240 hour(s))  Culture, blood (Routine x 2)     Status: None (Preliminary result)   Collection Time: 12/26/16 10:30 PM  Result Value Ref Range Status   Specimen Description BLOOD RIGHT HAND  Final   Special Requests   Final    BOTTLES DRAWN AEROBIC AND ANAEROBIC Blood Culture adequate volume   Culture NO GROWTH 2 DAYS  Final   Report Status PENDING  Incomplete  Culture, blood (Routine x 2)     Status: None (Preliminary result)   Collection Time: 12/27/16  1:57 AM  Result Value Ref Range Status   Specimen Description BLOOD RIGHT HAND  Final   Special Requests IN PEDIATRIC BOTTLE Blood Culture adequate volume  Final   Culture NO GROWTH 1 DAY  Final   Report Status PENDING  Incomplete         Radiology Studies: Dg Chest 2 View  Result Date: 12/26/2016 CLINICAL DATA:  Cough and fever for 2 days EXAM: CHEST  2 VIEW COMPARISON:  11/08/2016 FINDINGS: Cardiac shadow is mildly enlarged. Mild vascular prominence is again seen and stable. This may be related to a component of volume overload. No focal infiltrate is seen. Sclerotic changes are noted  throughout the bony structures which may be related to the underlying renal disease. IMPRESSION: Mild vascular prominence which may be related to volume overload. No focal infiltrate is seen. Sclerotic changes in the bone likely related to the underlying renal disease. Electronically Signed   By: Inez Catalina M.D.   On: 12/26/2016 22:44        Scheduled Meds: . amLODipine  10 mg Oral Daily  . calcium acetate  667 mg Oral TID WC  . carvedilol  12.5 mg Oral BID  . folic acid  1 mg Oral Daily  . gabapentin  300 mg Oral TID  . heparin  5,000 Units Subcutaneous Q8H  . insulin aspart  0-5 Units Subcutaneous QHS  .  insulin aspart  0-9 Units Subcutaneous TID WC  . insulin glargine  20 Units Subcutaneous QHS  . linagliptin  5 mg Oral Daily  . pantoprazole  40 mg Oral Daily   Continuous Infusions: . furosemide Stopped (12/28/16 1125)  . piperacillin-tazobactam (ZOSYN)  IV 2.25 g (12/28/16 1536)  . [START ON 12/29/2016] vancomycin       LOS: 1 day     Cordelia Poche, MD [facility] 12/28/2016, 4:16 PM Pager: [phone]  If 7PM-7AM, please contact night-coverage [email or web address] Password [email or web address] 12/28/2016, 4:16 PM

## 2016-12-28 NOTE — Progress Notes (Signed)
Admit: 12/27/2016 LOS: 1  Mr. [name] is a 66yo male with CKD stage 5 and recent LUE graft placement who presents with graft site swelling, warmth, fevers, chills and shortness of breath.  Subjective:  Patient with no nausea, vomiting. Overall states he feels ok.  Overnight, spiked a fever to 101F. Hgb 6.7 this morning and received 1U pRBCs.  07/25 0701 - 07/26 0700 In: 962 [P.O.:220; Blood:310; IV Piggyback:112] Out: 800 [Urine:800]  Filed Weights   12/27/16 0819 12/28/16 0451  Weight: 85.9 kg (189 lb 6.4 oz) 82 kg (180 lb 11.2 oz)    Scheduled Meds: . amLODipine  10 mg Oral Daily  . calcium acetate  667 mg Oral TID WC  . carvedilol  12.5 mg Oral BID  . folic acid  1 mg Oral Daily  . gabapentin  300 mg Oral TID  . heparin  5,000 Units Subcutaneous Q8H  . insulin aspart  0-5 Units Subcutaneous QHS  . insulin aspart  0-9 Units Subcutaneous TID WC  . insulin glargine  20 Units Subcutaneous QHS  . linagliptin  5 mg Oral Daily  . pantoprazole  40 mg Oral Daily   Continuous Infusions: . furosemide 120 mg (12/28/16 1025)  . piperacillin-tazobactam (ZOSYN)  IV Stopped (12/28/16 8366)  . [START ON 12/29/2016] vancomycin     PRN Meds:.acetaminophen **OR** acetaminophen, ondansetron **OR** ondansetron (ZOFRAN) IV, oxyCODONE, polyethylene glycol  Current Labs: reviewed Hgb 6.7, Na 133, K 4.5, Cl 96, Bicarb 28, BUN 51, Cr 5.26   Physical Exam:  Blood pressure (!) 131/59, pulse 75, temperature 98.4 F (36.9 C), temperature source Oral, resp. rate 16, height 6\' 1"  (1.854 m), weight 82 kg (180 lb 11.2 oz), SpO2 98 %. Constitutional: NAD, lying in bed comfortably CV: RRR, no murmurs, rubs or gallops, mild LE edema, pulses intact Resp: no increased work of breathing, off of supplemental oxygen Abd: nondistended Ext: moves all 4 extremities freely  A 1. CKD stage 5 - stable renal function and electrolytes. No indication for dialysis at this time. 2. LUE graft - vascular on board - do not  think there is a graft site infection 3. Fever - unknown origin; vascular does not think graft is infected; on vanc/zocyn 4. T2DM 5. HTN 6. Sickle cell anemia  P 1. Serial Bmets 2. No indication for dialysis at this time.  3. Continue fluid restriction 1524ml, strict ins/outs, daily weights, Na restriction 4. Continue phoslo 5. Continue evaluating for source of fever 6. Nephrology will sign of at this time, please call if further questions or concerns arise.  Alphonzo Grieve, MD PGY - 2 12/28/2016, 11:14 AM   Recent Labs Lab 12/26/16 2230 12/27/16 0824 12/28/16 0153  NA 136 136 133*  K 4.6 5.0 4.5  CL 100* 101 96*  CO2 26 25 28   GLUCOSE 192* 134* 100*  BUN 45* 46* 51*  CREATININE 4.83* 4.79* 5.26*  CALCIUM 9.5 9.2 9.5    Recent Labs Lab 12/26/16 2230 12/27/16 0824 12/28/16 0153 12/28/16 0735  WBC 21.3* 22.4* 19.4*  --   NEUTROABS 15.3* 14.8* 13.0*  --   HGB 6.7* 7.4* 6.7* 8.1*  HCT 21.1* 21.9* 20.6* 24.5*  MCV 66.8* 65.8* 66.2*  --   PLT 319 252 269  --

## 2016-12-28 NOTE — Progress Notes (Addendum)
CRITICAL VALUE ALERT  Critical Value:  Hbg 6.7  Date & Time Notied:  12/28/16 0226  Provider Notified: Baltazar Najjar, NP  Orders Received/Actions taken: Transfuse 1 unit PRBCs

## 2016-12-28 NOTE — Progress Notes (Signed)
Pharmacy Antibiotic Note  [name] is a 66 y.o. male admitted on 12/27/2016 with sepsis and cellulitis.  Pharmacy has been consulted for Vancocin and Zosyn dosing.  Plan: Vancomycin 1000mg  IV every 48 hours.  Goal trough 15-20 mcg/mL.  Zosyn 2.25g IV every 6 hours.  Height: 6\' 1"  (185.4 cm) Weight: 189 lb 6.4 oz (85.9 kg) IBW/kg (Calculated) : 79.9  Temp (24hrs), Avg:100 F (37.8 C), Min:98.6 F (37 C), Max:101 F (38.3 C)   Recent Labs Lab 12/26/16 2230 12/26/16 2245 12/27/16 0202 12/27/16 0824  WBC 21.3*  --   --  22.4*  CREATININE 4.83*  --   --  4.79*  LATICACIDVEN  --  1.10 1.22  --     Estimated Creatinine Clearance: 16.7 mL/min (A) (by C-G formula based on SCr of 4.79 mg/dL (H)).    Allergies  Allergen Reactions  . Morphine And Related Itching    Thank you for allowing pharmacy to be a part of this patient's care.  Wynona Neat, PharmD, BCPS  12/28/2016 1:21 AM

## 2016-12-29 ENCOUNTER — Inpatient Hospital Stay (HOSPITAL_COMMUNITY): Payer: Medicare HMO

## 2016-12-29 DIAGNOSIS — E1129 Type 2 diabetes mellitus with other diabetic kidney complication: Secondary | ICD-10-CM

## 2016-12-29 LAB — BPAM RBC
BLOOD PRODUCT EXPIRATION DATE: 201808142359
ISSUE DATE / TIME: 201807260356
UNIT TYPE AND RH: 7300

## 2016-12-29 LAB — TYPE AND SCREEN
ABO/RH(D): AB POS
ANTIBODY SCREEN: NEGATIVE
UNIT DIVISION: 0

## 2016-12-29 LAB — GLUCOSE, CAPILLARY
Glucose-Capillary: 142 mg/dL — ABNORMAL HIGH (ref 65–99)
Glucose-Capillary: 171 mg/dL — ABNORMAL HIGH (ref 65–99)
Glucose-Capillary: 91 mg/dL (ref 65–99)

## 2016-12-29 MED ORDER — LEVOFLOXACIN 500 MG PO TABS: 500.0000 mg | ORAL_TABLET | ORAL | 0 refills | Status: DC

## 2016-12-29 MED ORDER — FUROSEMIDE 80 MG PO TABS: 80.0000 mg | ORAL_TABLET | Freq: Every day | ORAL | 0 refills | Status: DC

## 2016-12-29 MED ORDER — LINAGLIPTIN 5 MG PO TABS: 2.5000 mg | ORAL_TABLET | Freq: Every day | ORAL | Status: DC

## 2016-12-29 MED ORDER — LEVOFLOXACIN 500 MG PO TABS: 500.0000 mg | ORAL_TABLET | ORAL | Status: DC

## 2016-12-29 MED ORDER — GABAPENTIN 100 MG PO CAPS: 100.0000 mg | ORAL_CAPSULE | Freq: Three times a day (TID) | ORAL | 0 refills | Status: DC

## 2016-12-29 MED ORDER — SAXAGLIPTIN HCL 5 MG PO TABS: 2.5000 mg | ORAL_TABLET | Freq: Every day | ORAL | Status: DC

## 2016-12-29 MED ORDER — FUROSEMIDE 80 MG PO TABS
80.0000 mg | ORAL_TABLET | Freq: Every day | ORAL | Status: DC
  Administered 2016-12-29: 80 mg via ORAL
  Filled 2016-12-29: qty 1

## 2016-12-29 MED ORDER — GABAPENTIN 100 MG PO CAPS: 100.0000 mg | ORAL_CAPSULE | Freq: Three times a day (TID) | ORAL | Status: DC

## 2016-12-29 NOTE — Progress Notes (Signed)
*  PRELIMINARY RESULTS* Vascular Ultrasound Duplex Dialysis Access (AVF, AGV) has been completed.   There is no obvious evidence of significant fluid collection surrounding the left upper extremity AV graft. Graft is patent throughout its length.  12/29/2016 2:36 PM Maudry Mayhew, BS, RVT, RDCS, RDMS

## 2016-12-29 NOTE — Progress Notes (Signed)
PT Cancellation Note  Patient Details Name: [name] MRN: [id number] DOB: Mar 19, 1951   Cancelled Treatment:    Reason Eval/Treat Not Completed: Pain limiting ability to participate;Fatigue/lethargy limiting ability to participate;Patient at procedure or test/unavailable.  Attempted Eval x3.  First attempt pt sleeping and reporting HA, LUE pain, and L flank pain (RN notified).  Second attempt pt eating lunch.  Third attempt pt off unit for testing.  Will f/u as able.    Naven Giambalvo E Penven-Crew 12/29/2016, 3:16 PM

## 2016-12-29 NOTE — Discharge Summary (Signed)
Physician Discharge Summary  Tommy Goostree Lajeunesse IOX:[id number] DOB: 05-08-1951 DOA: 12/27/2016  PCP: Nolene Ebbs, MD  Admit date: 12/27/2016 Discharge date: 12/29/2016  Admitted From: Home Disposition: Home  Recommendations for Outpatient Follow-up:  1. Follow up with PCP in 1 week 2. Follow up with Nephrology 3. Please obtain BMP/CBC in one week 4. Please follow up on the following pending results: Blood cultures  Home Health: None Equipment/Devices: None  Discharge Condition: Stable CODE STATUS: Full code Diet recommendation: Renal diet   Brief/Interim Summary:  Admission HPI written by Rise Patience, MD   Chief Complaint: Left arm pain and shortness of breath.  HPI: [name] is a 66 y.o. male with history of chronic kidney disease stage IV to 5, diabetes mellitus, hypertension, sickle cell anemia who had AV graft placed last month June 20 by Dr. Trula Slade presents to the ER because of increasing pain and swelling of the left arm with fever and chills. Patient also felt that he's been having shortness of breath last 2 days. Denies any chest pain or productive cough. Patient had gone to Dr. Stephens Shire office 2 days ago for the follow-up of his left AV graft.   ED Course: In the ER patient was mildly hypoxic chest x-ray shows congestion and eye exam patient has left forearm swelling with warmth and mild erythema. Blood cultures are obtained and patient was started on empiric antibiotics for possible sepsis. Hemoglobin is around 6. Patient's hemoglobin is usually around 7-8.    Hospital course:  SIRS Unknown source but possibly secondary to graft. Vascular surgery consulted feel graft is unlikely source. So far workup has been negative. Initial concern for cellulitis. Treated with Vancomycin and Zosyn. Urine culture no growth. Blood cultures no growth to date. Ultrasound of graft significant for no surrounding fluid. Patient transitioned to Levaquin on discharge.    Dyspnea Patient started on lasix. Symptoms resolved. Lasix continued.  Diabetes mellitus, type 2 Lantus and sliding scale insulin. Glipizide discontinued on discharge.  Anemia Sickle cell disease Patient had a mild drop in hemoglobin and received 1 unit of PRBCs. No evidence of sickle cell crisis. No transfusions unless evidence of crisis with significant acute anemia.  Essential hypertension Continued amlodipine and Coreg.  CKD stage 4 Nephrology follow-up as an outpatient.  Discharge Diagnoses:  Principal Problem:   Sepsis ([place]) Active Problems:   Type 2 diabetes mellitus with renal complication (HCC)   Chronic kidney disease (CKD), stage IV (severe) (HCC)   Pain and swelling of left forearm    Discharge Instructions  Discharge Instructions    Call MD for:  persistant nausea and vomiting    Complete by:  As directed    Call MD for:  temperature >100.4    Complete by:  As directed    Diet - low sodium heart healthy    Complete by:  As directed    Increase activity slowly    Complete by:  As directed      Allergies as of 12/29/2016      Reactions   Morphine And Related Itching      Medication List    STOP taking these medications   glipiZIDE 10 MG 24 hr tablet Commonly known as:  GLUCOTROL XL     TAKE these medications   acetaminophen 500 MG tablet Commonly known as:  TYLENOL Take 500 mg by mouth every 6 (six) hours as needed for headache (pain).   amLODipine 10 MG tablet Commonly known as:  NORVASC Take  1 tablet (10 mg total) by mouth daily.   calcium acetate 667 MG tablet Commonly known as:  PHOSLO Take 667 mg by mouth 3 (three) times daily.   carvedilol 12.5 MG tablet Commonly known as:  COREG Take 12.5 mg by mouth 2 (two) times daily.   folic acid 1 MG tablet Commonly known as:  FOLVITE Take 1 tablet (1 mg total) by mouth daily.   furosemide 80 MG tablet Commonly known as:  LASIX Take 1 tablet (80 mg total) by mouth daily. What  changed:  medication strength  how much to take   gabapentin 100 MG capsule Commonly known as:  NEURONTIN Take 1 capsule (100 mg total) by mouth 3 (three) times daily. What changed:  medication strength  how much to take   insulin glargine 100 unit/mL Sopn Commonly known as:  LANTUS Inject 20 Units into the skin at bedtime.   levofloxacin 500 MG tablet Commonly known as:  LEVAQUIN Take 1 tablet (500 mg total) by mouth every other day.   NOVOLOG FLEXPEN 100 UNIT/ML FlexPen Generic drug:  insulin aspart Inject 2-6 Units into the skin 3 (three) times daily as needed for high blood sugar (CBG >150).   omeprazole 20 MG capsule Commonly known as:  PRILOSEC Take 20 mg by mouth daily.   oxyCODONE 5 MG immediate release tablet Commonly known as:  ROXICODONE Take 1 tablet (5 mg total) by mouth every 6 (six) hours as needed. What changed:  reasons to take this   polyethylene glycol powder powder Commonly known as:  MIRALAX Take 17 g by mouth daily. What changed:  when to take this  reasons to take this  additional instructions   saxagliptin HCl 5 MG Tabs tablet Commonly known as:  ONGLYZA Take 0.5 tablets (2.5 mg total) by mouth daily. What changed:  how much to take   SENNA LAXATIVE PO Take 1 tablet by mouth daily as needed (constipation).   STOOL SOFTENER PO Take 1 capsule by mouth daily as needed (constipation).      Follow-up Information    Nolene Ebbs, MD. Schedule an appointment as soon as possible for a visit.   Specialty:  Internal Medicine Contact information: [place] [address] [phone]          Allergies  Allergen Reactions  . Morphine And Related Itching    Consultations:  Nephrology  Vascular surgery   Procedures/Studies: Dg Chest 2 View  Result Date: 12/26/2016 CLINICAL DATA:  Cough and fever for 2 days EXAM: CHEST  2 VIEW COMPARISON:  11/08/2016 FINDINGS: Cardiac shadow is mildly enlarged. Mild  vascular prominence is again seen and stable. This may be related to a component of volume overload. No focal infiltrate is seen. Sclerotic changes are noted throughout the bony structures which may be related to the underlying renal disease. IMPRESSION: Mild vascular prominence which may be related to volume overload. No focal infiltrate is seen. Sclerotic changes in the bone likely related to the underlying renal disease. Electronically Signed   By: Inez Catalina M.D.   On: 12/26/2016 22:44      Subjective: Patient reports no issues. Afebrile overnight.  Discharge Exam: Vitals:   12/28/16 2122 12/29/16 0520  BP: (!) 143/62 (!) 142/79  Pulse: 68 73  Resp: 20 18  Temp: 98.5 F (36.9 C) 98.7 F (37.1 C)   Vitals:   12/28/16 0623 12/28/16 1502 12/28/16 2122 12/29/16 0520  BP: (!) 131/59 (!) 142/80 (!) 143/62 (!) 142/79  Pulse:  75 74 68 73  Resp: 16 16 20 18   Temp: 98.4 F (36.9 C) 98.3 F (36.8 C) 98.5 F (36.9 C) 98.7 F (37.1 C)  TempSrc: Oral  Oral Oral  SpO2: 98% 100% 99% 97%  Weight:    85.1 kg (187 lb 11.2 oz)  Height:        General: Pt is alert, awake, not in acute distress Cardiovascular: RRR, S1/S2 +, no rubs, no gallops. Graft with palpable thrill and audible bruit Respiratory: CTA bilaterally, no wheezing, no rhonchi Abdominal: Soft, NT, ND, bowel sounds + Extremities: no edema, no cyanosis    The results of significant diagnostics from this hospitalization (including imaging, microbiology, ancillary and laboratory) are listed below for reference.     Microbiology: Recent Results (from the past 240 hour(s))  Culture, blood (Routine x 2)     Status: None (Preliminary result)   Collection Time: 12/26/16 10:30 PM  Result Value Ref Range Status   Specimen Description BLOOD RIGHT HAND  Final   Special Requests   Final    BOTTLES DRAWN AEROBIC AND ANAEROBIC Blood Culture adequate volume   Culture NO GROWTH 3 DAYS  Final   Report Status PENDING  Incomplete   Culture, blood (Routine x 2)     Status: None (Preliminary result)   Collection Time: 12/27/16  1:57 AM  Result Value Ref Range Status   Specimen Description BLOOD RIGHT HAND  Final   Special Requests IN PEDIATRIC BOTTLE Blood Culture adequate volume  Final   Culture NO GROWTH 2 DAYS  Final   Report Status PENDING  Incomplete     Labs: BNP (last 3 results) No results for input(s): BNP in the last 8760 hours. Basic Metabolic Panel:  Recent Labs Lab 12/26/16 2230 12/27/16 0824 12/28/16 0153  NA 136 136 133*  K 4.6 5.0 4.5  CL 100* 101 96*  CO2 26 25 28   GLUCOSE 192* 134* 100*  BUN 45* 46* 51*  CREATININE 4.83* 4.79* 5.26*  CALCIUM 9.5 9.2 9.5   Liver Function Tests:  Recent Labs Lab 12/26/16 2230 12/27/16 0824  AST 16 16  ALT 8* 7*  ALKPHOS 75 69  BILITOT 1.6* 1.6*  PROT 7.8 7.6  ALBUMIN 3.7 3.3*   No results for input(s): LIPASE, AMYLASE in the last 168 hours. No results for input(s): AMMONIA in the last 168 hours. CBC:  Recent Labs Lab 12/26/16 2230 12/27/16 0824 12/28/16 0153 12/28/16 0735  WBC 21.3* 22.4* 19.4*  --   NEUTROABS 15.3* 14.8* 13.0*  --   HGB 6.7* 7.4* 6.7* 8.1*  HCT 21.1* 21.9* 20.6* 24.5*  MCV 66.8* 65.8* 66.2*  --   PLT 319 252 269  --    Cardiac Enzymes: No results for input(s): CKTOTAL, CKMB, CKMBINDEX, TROPONINI in the last 168 hours. BNP: Invalid input(s): POCBNP CBG:  Recent Labs Lab 12/28/16 1206 12/28/16 1647 12/28/16 2119 12/29/16 0802 12/29/16 1151  GLUCAP 118* 172* 101* 142* 171*   D-Dimer No results for input(s): DDIMER in the last 72 hours. Hgb A1c No results for input(s): HGBA1C in the last 72 hours. Lipid Profile No results for input(s): CHOL, HDL, LDLCALC, TRIG, CHOLHDL, LDLDIRECT in the last 72 hours. Thyroid function studies No results for input(s): TSH, T4TOTAL, T3FREE, THYROIDAB in the last 72 hours.  Invalid input(s): FREET3 Anemia work up No results for input(s): VITAMINB12, FOLATE, FERRITIN,  TIBC, IRON, RETICCTPCT in the last 72 hours. Urinalysis    Component Value Date/Time   COLORURINE YELLOW 12/27/2016  [place] 12/27/2016 0318   LABSPEC 1.012 12/27/2016 0318   PHURINE 5.0 12/27/2016 0318   GLUCOSEU NEGATIVE 12/27/2016 0318   HGBUR NEGATIVE 12/27/2016 0318   BILIRUBINUR NEGATIVE 12/27/2016 0318   KETONESUR NEGATIVE 12/27/2016 0318   PROTEINUR 100 (A) 12/27/2016 0318   UROBILINOGEN 1.0 09/06/2014 2203   NITRITE NEGATIVE 12/27/2016 0318   LEUKOCYTESUR NEGATIVE 12/27/2016 0318   Sepsis Labs Invalid input(s): PROCALCITONIN,  WBC,  LACTICIDVEN Microbiology Recent Results (from the past 240 hour(s))  Culture, blood (Routine x 2)     Status: None (Preliminary result)   Collection Time: 12/26/16 10:30 PM  Result Value Ref Range Status   Specimen Description BLOOD RIGHT HAND  Final   Special Requests   Final    BOTTLES DRAWN AEROBIC AND ANAEROBIC Blood Culture adequate volume   Culture NO GROWTH 3 DAYS  Final   Report Status PENDING  Incomplete  Culture, blood (Routine x 2)     Status: None (Preliminary result)   Collection Time: 12/27/16  1:57 AM  Result Value Ref Range Status   Specimen Description BLOOD RIGHT HAND  Final   Special Requests IN PEDIATRIC BOTTLE Blood Culture adequate volume  Final   Culture NO GROWTH 2 DAYS  Final   Report Status PENDING  Incomplete     Time coordinating discharge: Over 30 minutes  SIGNED:   Cordelia Poche, MD [facility] 12/29/2016, 4:20 PM Pager [phone]  If 7PM-7AM, please contact night-coverage [email or web address] Password [email or web address]

## 2016-12-29 NOTE — Progress Notes (Signed)
ANTIBIOTIC CONSULT NOTE - INITIAL  Pharmacy Consult for levofloxacin Indication: cellulitis  Allergies  Allergen Reactions  . Morphine And Related Itching    Patient Measurements: Height: 6\' 1"  (185.4 cm) Weight: 187 lb 11.2 oz (85.1 kg) IBW/kg (Calculated) : 79.9 Adjusted Body Weight:   Vital Signs: Temp: 98.7 F (37.1 C) (07/27 0520) Temp Source: Oral (07/27 0520) BP: 142/79 (07/27 0520) Pulse Rate: 73 (07/27 0520) Intake/Output from previous day: 07/26 0701 - 07/27 0700 In: 300 [IV Piggyback:300] Out: 2775 [Urine:2775] Intake/Output from this shift: No intake/output data recorded.  Labs:  Recent Labs  12/26/16 2230 12/27/16 0824 12/28/16 0153 12/28/16 0735  WBC 21.3* 22.4* 19.4*  --   HGB 6.7* 7.4* 6.7* 8.1*  PLT 319 252 269  --   CREATININE 4.83* 4.79* 5.26*  --    Estimated Creatinine Clearance: 15.2 mL/min (A) (by C-G formula based on SCr of 5.26 mg/dL (H)). No results for input(s): VANCOTROUGH, VANCOPEAK, VANCORANDOM, GENTTROUGH, GENTPEAK, GENTRANDOM, TOBRATROUGH, TOBRAPEAK, TOBRARND, AMIKACINPEAK, AMIKACINTROU, AMIKACIN in the last 72 hours.   Microbiology: Recent Results (from the past 720 hour(s))  Culture, blood (Routine x 2)     Status: None (Preliminary result)   Collection Time: 12/26/16 10:30 PM  Result Value Ref Range Status   Specimen Description BLOOD RIGHT HAND  Final   Special Requests   Final    BOTTLES DRAWN AEROBIC AND ANAEROBIC Blood Culture adequate volume   Culture NO GROWTH 3 DAYS  Final   Report Status PENDING  Incomplete  Culture, blood (Routine x 2)     Status: None (Preliminary result)   Collection Time: 12/27/16  1:57 AM  Result Value Ref Range Status   Specimen Description BLOOD RIGHT HAND  Final   Special Requests IN PEDIATRIC BOTTLE Blood Culture adequate volume  Final   Culture NO GROWTH 2 DAYS  Final   Report Status PENDING  Incomplete    Medical History: Past Medical History:  Diagnosis Date  . Diabetes mellitus    . GERD (gastroesophageal reflux disease)   . Heart murmur   . Hypertension   . Pneumonia   . Renal insufficiency   . Sickle cell anemia (HCC)    Hgb SS disease     Medications:  Infusions:  . furosemide Stopped (12/29/16 1024)   Assessment:  Patient is a 66 y.o. male who was admitted on 12/27/2016 with complaints of left arm pain and swelling, dyspnea, fevers and chills.  He was started on Vancomycin and Zosyn for cellulitis and possible sepsis. Blood cultures are negative to-date and patient is afebrile. Vancomycin and Zosyn have been discontinued, and pharmacy has been consulted to dose levofloxacin.    Goal of Therapy:  Infection eradication and symptom resolution    Plan:  Initiate Levofloxacin 500 mg po Q48 at bedtime Monitor for s/sx of infection and symptom resolution  Leroy Libman, PharmD Pharmacy Resident Pager: [phone] 12/29/2016,1:12 PM

## 2016-12-29 NOTE — Discharge Instructions (Signed)
[place],  You were admitted because of fevers and concern for infection. This may have been a skin infection. You will go home on antibiotics. Please continue until completed course.

## 2016-12-29 NOTE — Progress Notes (Signed)
    Subjective  -   States he is feeling a little better   Physical Exam:  There remains a good thrill within his left forearm graft. No significant erythema surrounding or overlying his left forearm graft       Assessment/Plan:    Fever of unknown etiology: Again based on the appearance of his left forearm, it does not appear as if the graft is the infection source.  I am going to order an ultrasound just to make sure there is no significant fluid collections around the graft.  Annamarie Major 12/29/2016 8:52 AM --  Vitals:   12/28/16 2122 12/29/16 0520  BP: (!) 143/62 (!) 142/79  Pulse: 68 73  Resp: 20 18  Temp: 98.5 F (36.9 C) 98.7 F (37.1 C)    Intake/Output Summary (Last 24 hours) at 12/29/16 0852 Last data filed at 12/29/16 0549  Gross per 24 hour  Intake              300 ml  Output             2775 ml  Net            -2475 ml     Laboratory CBC    Component Value Date/Time   WBC 19.4 (H) 12/28/2016 0153   HGB 8.1 (L) 12/28/2016 0735   HCT 24.5 (L) 12/28/2016 0735   HCT 14.3 (LL) 11/07/2016 1518   PLT 269 12/28/2016 0153    BMET    Component Value Date/Time   NA 133 (L) 12/28/2016 0153   K 4.5 12/28/2016 0153   CL 96 (L) 12/28/2016 0153   CO2 28 12/28/2016 0153   GLUCOSE 100 (H) 12/28/2016 0153   BUN 51 (H) 12/28/2016 0153   CREATININE 5.26 (H) 12/28/2016 0153   CALCIUM 9.5 12/28/2016 0153   GFRNONAA 10 (L) 12/28/2016 0153   GFRAA 12 (L) 12/28/2016 0153    COAG Lab Results  Component Value Date   INR 1.02 12/26/2016   INR 1.13 09/03/2014   INR 1.09 07/27/2011   No results found for: PTT  Antibiotics Anti-infectives    Start     Dose/Rate Route Frequency Ordered Stop   12/29/16 0400  vancomycin (VANCOCIN) IVPB 1000 mg/200 mL premix  Status:  Discontinued     1,000 mg 200 mL/hr over 60 Minutes Intravenous  Once 12/28/16 0125 12/28/16 0126   12/29/16 0400  vancomycin (VANCOCIN) IVPB 1000 mg/200 mL premix     1,000 mg 200 mL/hr  over 60 Minutes Intravenous Every 48 hours 12/28/16 0126     12/28/16 0200  piperacillin-tazobactam (ZOSYN) IVPB 2.25 g     2.25 g 100 mL/hr over 30 Minutes Intravenous Every 6 hours 12/28/16 0125     12/27/16 0200  vancomycin (VANCOCIN) IVPB 1000 mg/200 mL premix     1,000 mg 200 mL/hr over 60 Minutes Intravenous  Once 12/27/16 0150 12/27/16 0418   12/27/16 0200  piperacillin-tazobactam (ZOSYN) IVPB 3.375 g     3.375 g 100 mL/hr over 30 Minutes Intravenous  Once 12/27/16 0150 12/27/16 0330       V. Leia Alf, M.D. [facility] Office: [phone] Pager:  [phone]

## 2016-12-30 LAB — URINE CULTURE: CULTURE: NO GROWTH

## 2016-12-31 LAB — CULTURE, BLOOD (ROUTINE X 2)
Culture: NO GROWTH
Special Requests: ADEQUATE

## 2017-01-01 LAB — CULTURE, BLOOD (ROUTINE X 2)
Culture: NO GROWTH
SPECIAL REQUESTS: ADEQUATE

## 2017-01-23 ENCOUNTER — Emergency Department (HOSPITAL_COMMUNITY): Payer: Medicare HMO

## 2017-01-23 ENCOUNTER — Encounter (HOSPITAL_COMMUNITY): Payer: Self-pay

## 2017-01-23 ENCOUNTER — Inpatient Hospital Stay (HOSPITAL_COMMUNITY)
Admission: EM | Admit: 2017-01-23 | Discharge: 2017-01-26 | DRG: 812 | Disposition: A | Discharge status: Home / Self Care | Payer: Medicare HMO | Attending: Internal Medicine | Admitting: Internal Medicine

## 2017-01-23 DIAGNOSIS — Z87891 Personal history of nicotine dependence: Secondary | ICD-10-CM

## 2017-01-23 DIAGNOSIS — D638 Anemia in other chronic diseases classified elsewhere: Secondary | ICD-10-CM | POA: Diagnosis present

## 2017-01-23 DIAGNOSIS — R0789 Other chest pain: Secondary | ICD-10-CM | POA: Diagnosis present

## 2017-01-23 DIAGNOSIS — Z794 Long term (current) use of insulin: Secondary | ICD-10-CM | POA: Diagnosis not present

## 2017-01-23 DIAGNOSIS — D5700 Hb-SS disease with crisis, unspecified: Secondary | ICD-10-CM | POA: Diagnosis present

## 2017-01-23 DIAGNOSIS — E1121 Type 2 diabetes mellitus with diabetic nephropathy: Secondary | ICD-10-CM | POA: Diagnosis not present

## 2017-01-23 DIAGNOSIS — N184 Chronic kidney disease, stage 4 (severe): Secondary | ICD-10-CM | POA: Diagnosis present

## 2017-01-23 DIAGNOSIS — Z9841 Cataract extraction status, right eye: Secondary | ICD-10-CM

## 2017-01-23 DIAGNOSIS — D72829 Elevated white blood cell count, unspecified: Secondary | ICD-10-CM | POA: Diagnosis present

## 2017-01-23 DIAGNOSIS — I10 Essential (primary) hypertension: Secondary | ICD-10-CM | POA: Diagnosis not present

## 2017-01-23 DIAGNOSIS — I272 Pulmonary hypertension, unspecified: Secondary | ICD-10-CM | POA: Diagnosis present

## 2017-01-23 DIAGNOSIS — I129 Hypertensive chronic kidney disease with stage 1 through stage 4 chronic kidney disease, or unspecified chronic kidney disease: Secondary | ICD-10-CM | POA: Diagnosis present

## 2017-01-23 DIAGNOSIS — E1129 Type 2 diabetes mellitus with other diabetic kidney complication: Secondary | ICD-10-CM | POA: Diagnosis present

## 2017-01-23 DIAGNOSIS — Z961 Presence of intraocular lens: Secondary | ICD-10-CM | POA: Diagnosis present

## 2017-01-23 DIAGNOSIS — Z9842 Cataract extraction status, left eye: Secondary | ICD-10-CM

## 2017-01-23 DIAGNOSIS — K219 Gastro-esophageal reflux disease without esophagitis: Secondary | ICD-10-CM | POA: Diagnosis present

## 2017-01-23 DIAGNOSIS — Z885 Allergy status to narcotic agent status: Secondary | ICD-10-CM

## 2017-01-23 DIAGNOSIS — E1122 Type 2 diabetes mellitus with diabetic chronic kidney disease: Secondary | ICD-10-CM | POA: Diagnosis present

## 2017-01-23 DIAGNOSIS — Z79899 Other long term (current) drug therapy: Secondary | ICD-10-CM | POA: Diagnosis not present

## 2017-01-23 DIAGNOSIS — N185 Chronic kidney disease, stage 5: Secondary | ICD-10-CM | POA: Diagnosis present

## 2017-01-23 DIAGNOSIS — R079 Chest pain, unspecified: Secondary | ICD-10-CM | POA: Diagnosis present

## 2017-01-23 LAB — COMPREHENSIVE METABOLIC PANEL
ALK PHOS: 78 U/L (ref 38–126)
ALT: 7 U/L — ABNORMAL LOW (ref 17–63)
ANION GAP: 16 — AB (ref 5–15)
AST: 31 U/L (ref 15–41)
Albumin: 4.2 g/dL (ref 3.5–5.0)
BUN: 41 mg/dL — ABNORMAL HIGH (ref 6–20)
CALCIUM: 9.7 mg/dL (ref 8.9–10.3)
CO2: 20 mmol/L — AB (ref 22–32)
Chloride: 102 mmol/L (ref 101–111)
Creatinine, Ser: 4.36 mg/dL — ABNORMAL HIGH (ref 0.61–1.24)
GFR calc non Af Amer: 13 mL/min — ABNORMAL LOW (ref >60)
GFR, EST AFRICAN AMERICAN: 15 mL/min — AB (ref >60)
Glucose, Bld: 154 mg/dL — ABNORMAL HIGH (ref 65–99)
Potassium: 5 mmol/L (ref 3.5–5.1)
SODIUM: 138 mmol/L (ref 135–145)
Total Bilirubin: 1.9 mg/dL — ABNORMAL HIGH (ref 0.3–1.2)
Total Protein: 8.4 g/dL — ABNORMAL HIGH (ref 6.5–8.1)

## 2017-01-23 LAB — URINALYSIS, ROUTINE W REFLEX MICROSCOPIC
BACTERIA UA: NONE SEEN
Bilirubin Urine: NEGATIVE
Glucose, UA: NEGATIVE mg/dL
Hgb urine dipstick: NEGATIVE
Ketones, ur: NEGATIVE mg/dL
Leukocytes, UA: NEGATIVE
Nitrite: NEGATIVE
PH: 5 (ref 5.0–8.0)
Protein, ur: 30 mg/dL — AB
RBC / HPF: NONE SEEN RBC/hpf (ref 0–5)
SPECIFIC GRAVITY, URINE: 1.01 (ref 1.005–1.030)

## 2017-01-23 LAB — CBC WITH DIFFERENTIAL/PLATELET
BASOS ABS: 0 10*3/uL (ref 0.0–0.1)
BASOS PCT: 0 %
EOS ABS: 0.7 10*3/uL (ref 0.0–0.7)
Eosinophils Relative: 4 %
HCT: 26.6 % — ABNORMAL LOW (ref 39.0–52.0)
HEMOGLOBIN: 8.6 g/dL — AB (ref 13.0–17.0)
LYMPHS PCT: 15 %
Lymphs Abs: 2.6 10*3/uL (ref 0.7–4.0)
MCH: 21.9 pg — AB (ref 26.0–34.0)
MCHC: 32.3 g/dL (ref 30.0–36.0)
MCV: 67.7 fL — ABNORMAL LOW (ref 78.0–100.0)
Monocytes Absolute: 1.9 10*3/uL — ABNORMAL HIGH (ref 0.1–1.0)
Monocytes Relative: 11 %
NEUTROS PCT: 70 %
Neutro Abs: 12.2 10*3/uL — ABNORMAL HIGH (ref 1.7–7.7)
Platelets: 205 10*3/uL (ref 150–400)
RBC: 3.93 MIL/uL — AB (ref 4.22–5.81)
RDW: 20.7 % — ABNORMAL HIGH (ref 11.5–15.5)
WBC: 17.4 10*3/uL — AB (ref 4.0–10.5)

## 2017-01-23 LAB — RETICULOCYTES
RBC.: 3.93 MIL/uL — AB (ref 4.22–5.81)
RETIC COUNT ABSOLUTE: 161.1 10*3/uL (ref 19.0–186.0)
RETIC CT PCT: 4.1 % — AB (ref 0.4–3.1)

## 2017-01-23 LAB — BRAIN NATRIURETIC PEPTIDE: B Natriuretic Peptide: 569.9 pg/mL — ABNORMAL HIGH (ref 0.0–100.0)

## 2017-01-23 LAB — I-STAT TROPONIN, ED: TROPONIN I, POC: 0 ng/mL (ref 0.00–0.08)

## 2017-01-23 MED ORDER — HYDROMORPHONE HCL 1 MG/ML IJ SOLN
1.0000 mg | Freq: Once | INTRAMUSCULAR | Status: AC
  Administered 2017-01-23: 1 mg via INTRAVENOUS
  Filled 2017-01-23: qty 1

## 2017-01-23 MED ORDER — SODIUM CHLORIDE 0.45 % IV BOLUS: 1000.0000 mL | Freq: Once | INTRAVENOUS | Status: DC

## 2017-01-23 MED ORDER — SODIUM CHLORIDE 0.9 % IV BOLUS (SEPSIS)
500.0000 mL | Freq: Once | INTRAVENOUS | Status: AC
  Administered 2017-01-23: 500 mL via INTRAVENOUS

## 2017-01-23 NOTE — H&P (Signed)
History and Physical    [name] VZD:[id number] DOB: 10-25-1950 DOA: 01/23/2017  PCP: Nolene Ebbs, MD   Patient coming from: Home.  I have personally briefly reviewed patient's old medical records in [place]  Chief Complaint: sickle cell pain crisis.  HPI: [name] is a 66 y.o. male with medical history significant of type 2 diabetes, GERD, heart murmur, hypertension, pneumonia, chronic kidney disease stage IV, sickle cell anemia who is coming to the emergency department with complaints of having a sickle cell pain crisis which is mostly affecting his back and extremities. He also complains of right sided chest pain associated with shortness of breath. He denies fever, chills, sore throat, dizziness, diaphoresis, PND or orthopnea. He denies nausea or vomiting, but complains of crampy abdominal pain associated with 4 episodes of diarrhea earlier in the day. He denies dysuria, frequency, hematuria, polyuria, polydipsia or blurred vision.  ED Course: the patient received 3 rounds of hydromorphone 1 mg IVP and normal saline 562mL 1 dose. His workup showed WBC of 12.4, hemoglobin 8.6 g/dL and platelets 205. His sodium was 138, potassium 5.0, chloride 102 and bicarbonate 20 mmol/L. BUN 41, creatinine 4.36 and glucose 154 mg/dL.  Review of Systems: As per HPI otherwise 10 point review of systems negative.    Past Medical History:  Diagnosis Date  . Diabetes mellitus   . GERD (gastroesophageal reflux disease)   . Heart murmur   . Hypertension   . Pneumonia   . Renal insufficiency   . Sickle cell anemia (HCC)    Hgb SS disease     Past Surgical History:  Procedure Laterality Date  . AV FISTULA PLACEMENT Left 11/22/2016   Procedure: INSERTION OF ARTERIOVENOUS (AV) GORE-TEX GRAFT  LEFT FOREARM USING 6MM X 50CM GORETEX GRAFT;  Surgeon: Serafina Mitchell, MD;  Location: [facility];  Service: Vascular;  Laterality: Left;  . CATARACT EXTRACTION W/ INTRAOCULAR LENS  IMPLANT,  BILATERAL    . CHOLECYSTECTOMY    . COLON SURGERY     Colon cancer surgery, removed small amt not full colectomy  . COLONOSCOPY     nclear who follows him for this  . LEG SURGERY     For ? gangrene after running into barbwire fence at 66yo     reports that he quit smoking about 50 years ago. He has a 10.00 pack-year smoking history. He has never used smokeless tobacco. He reports that he drinks alcohol. He reports that he uses drugs, including Cocaine.  Allergies  Allergen Reactions  . Morphine And Related Itching    Family History  Problem Relation Age of Onset  . Venous thrombosis Mother        Dec. of blood clot   . Diabetes type II Mother   . AAA (abdominal aortic aneurysm) Mother   . Throat cancer Father        Deceased of throat cancer    Prior to Admission medications   Medication Sig Start Date End Date Taking? Authorizing Provider  acetaminophen (TYLENOL) 500 MG tablet Take 500 mg by mouth every 6 (six) hours as needed for headache (pain).   Yes [provider]  amLODipine (NORVASC) 10 MG tablet Take 1 tablet (10 mg total) by mouth daily. 06/07/16  Yes Leana Gamer, MD  calcium acetate (PHOSLO) 667 MG tablet Take 667 mg by mouth 3 (three) times daily. 09/26/16  Yes [provider]  carvedilol (COREG) 12.5 MG tablet Take 12.5 mg by mouth 2 (  two) times daily. 09/18/16  Yes [provider]  Docusate Calcium (STOOL SOFTENER PO) Take 1 capsule by mouth daily as needed (constipation).   Yes [provider]  folic acid (FOLVITE) 1 MG tablet Take 1 tablet (1 mg total) by mouth daily. 04/18/13  Yes Robbie Lis, MD  furosemide (LASIX) 80 MG tablet Take 1 tablet (80 mg total) by mouth daily. 12/30/16  Yes Mariel Aloe, MD  gabapentin (NEURONTIN) 100 MG capsule Take 1 capsule (100 mg total) by mouth 3 (three) times daily. 12/29/16  Yes Mariel Aloe, MD  insulin aspart (NOVOLOG FLEXPEN) 100 UNIT/ML FlexPen Inject 2-6 Units into the skin  3 (three) times daily as needed for high blood sugar (CBG >150).   Yes [provider]  insulin glargine (LANTUS) 100 unit/mL SOPN Inject 20 Units into the skin at bedtime.    Yes [provider]  omeprazole (PRILOSEC) 20 MG capsule Take 20 mg by mouth daily.    Yes [provider]  oxyCODONE (ROXICODONE) 5 MG immediate release tablet Take 1 tablet (5 mg total) by mouth every 6 (six) hours as needed. Patient taking differently: Take 5 mg by mouth every 6 (six) hours as needed for moderate pain.  11/22/16  Yes Rhyne, Samantha J, PA-C  polyethylene glycol powder (MIRALAX) powder Take 17 g by mouth daily. Patient taking differently: Take 17 g by mouth daily as needed (constipation). Mix in 8 oz liquid and drink 10/28/16  Yes Palumbo, April, MD  saxagliptin HCl (ONGLYZA) 5 MG TABS tablet Take 0.5 tablets (2.5 mg total) by mouth daily. 12/29/16  Yes Mariel Aloe, MD  Sennosides (SENNA LAXATIVE PO) Take 1 tablet by mouth daily as needed (constipation).   Yes [provider]  levofloxacin (LEVAQUIN) 500 MG tablet Take 1 tablet (500 mg total) by mouth every other day. Patient not taking: Reported on 01/23/2017 12/29/16   Mariel Aloe, MD    Physical Exam: Vitals:   01/23/17 2130 01/23/17 2145 01/23/17 2215 01/23/17 2230  BP: (!) 156/84 (!) 143/75 (!) 141/87 (!) 157/93  Pulse: 87 84 88 83  Resp: 18 17 13 14   Temp:      TempSrc:      SpO2: 92% 97% 100% 97%  Weight:      Height:        Constitutional: NAD, calm, comfortable Eyes: PERRL, lids and conjunctivae normal. ENMT: Mucous membranes are moist. Posterior pharynx clear of any exudate or lesions. Absent dentition.  Neck: normal, supple, no masses, no thyromegaly Respiratory: clear to auscultation bilaterally, no wheezing, no crackles. Normal respiratory effort. No accessory muscle use.  Cardiovascular: Regular rate and rhythm, soft systolic murmurs, no rubs / gallops. No extremity edema. 2+ pedal pulses. No  carotid bruits. Positive left AV fistula with good thrill. Abdomen: no tenderness, no masses palpated. No hepatosplenomegaly. Bowel sounds positive.  Musculoskeletal: no clubbing / cyanosis. Good ROM, no contractures. Normal muscle tone.  Skin: no rashes, lesions, ulcers on limited skin exam. Neurologic: CN 2-12 grossly intact. Sensation intact, DTR normal. Strength 5/5 in all 4.  Psychiatric: Normal judgment and insight. Alert and oriented x 3. Normal mood.    Labs on Admission: I have personally reviewed following labs and imaging studies  CBC:  Recent Labs Lab 01/23/17 1800  WBC 17.4*  NEUTROABS 12.2*  HGB 8.6*  HCT 26.6*  MCV 67.7*  PLT 017   Basic Metabolic Panel:  Recent Labs Lab 01/23/17 1800  NA 138  K  5.0  CL 102  CO2 20*  GLUCOSE 154*  BUN 41*  CREATININE 4.36*  CALCIUM 9.7   GFR: Estimated Creatinine Clearance: 18.3 mL/min (A) (by C-G formula based on SCr of 4.36 mg/dL (H)). Liver Function Tests:  Recent Labs Lab 01/23/17 1800  AST 31  ALT 7*  ALKPHOS 78  BILITOT 1.9*  PROT 8.4*  ALBUMIN 4.2   No results for input(s): LIPASE, AMYLASE in the last 168 hours. No results for input(s): AMMONIA in the last 168 hours. Coagulation Profile: No results for input(s): INR, PROTIME in the last 168 hours. Cardiac Enzymes: No results for input(s): CKTOTAL, CKMB, CKMBINDEX, TROPONINI in the last 168 hours. BNP (last 3 results) No results for input(s): PROBNP in the last 8760 hours. HbA1C: No results for input(s): HGBA1C in the last 72 hours. CBG: No results for input(s): GLUCAP in the last 168 hours. Lipid Profile: No results for input(s): CHOL, HDL, LDLCALC, TRIG, CHOLHDL, LDLDIRECT in the last 72 hours. Thyroid Function Tests: No results for input(s): TSH, T4TOTAL, FREET4, T3FREE, THYROIDAB in the last 72 hours. Anemia Panel:  Recent Labs  01/23/17 1800  RETICCTPCT 4.1*   Urine analysis:    Component Value Date/Time   COLORURINE YELLOW  01/23/2017 2223   APPEARANCEUR CLEAR 01/23/2017 2223   LABSPEC 1.010 01/23/2017 2223   PHURINE 5.0 01/23/2017 2223   GLUCOSEU NEGATIVE 01/23/2017 2223   HGBUR NEGATIVE 01/23/2017 2223   BILIRUBINUR NEGATIVE 01/23/2017 2223   KETONESUR NEGATIVE 01/23/2017 2223   PROTEINUR 30 (A) 01/23/2017 2223   UROBILINOGEN 1.0 09/06/2014 2203   NITRITE NEGATIVE 01/23/2017 2223   LEUKOCYTESUR NEGATIVE 01/23/2017 2223    Radiological Exams on Admission: Dg Chest 2 View  Result Date: 01/23/2017 CLINICAL DATA:  Chest pain. EXAM: CHEST  2 VIEW COMPARISON:  December 26, 2016 FINDINGS: The heart, hila, mediastinum, lungs, and pleura are unremarkable. Sclerotic bones are seen and stable consistent with sickle cell disease. IMPRESSION: Evidence of sickle cell disease.  No acute abnormalities. Electronically Signed   By: Dorise Bullion III M.D   On: 01/23/2017 18:22   Echocardiogram 11/10/2016 ------------------------------------------------------------------- LV EF: 60% -   65%  ------------------------------------------------------------------- Indications:      Acute respiratory distress 518.82.  ------------------------------------------------------------------- History:   Risk factors:  LBBB. Hypertension. Diabetes mellitus.   ------------------------------------------------------------------- Study Conclusions  - Left ventricle: The cavity size was normal. Wall thickness was   normal. Systolic function was normal. The estimated ejection   fraction was in the range of 60% to 65%. Wall motion was normal;   there were no regional wall motion abnormalities. Features are   consistent with a pseudonormal left ventricular filling pattern,   with concomitant abnormal relaxation and increased filling   pressure (grade 2 diastolic dysfunction). Doppler parameters are   consistent with high ventricular filling pressure. - Aortic valve: Mildly thickened, mildly calcified leaflets. - Mitral valve: There was  trivial regurgitation. - Left atrium: The atrium was mildly dilated. - Tricuspid valve: There was mild regurgitation. - Pulmonary arteries: PA peak pressure: 38 mm Hg (S).  Impressions:  - The right ventricular systolic pressure was increased consistent   with mild pulmonary hypertension.  EKG: Independently reviewed. Vent. rate 66 BPM PR interval 176 ms QRS duration 142 ms QT/QTc 442/463 ms P-R-T axes 35 114 -1 Normal sinus rhythm Right axis deviation Non-specific intra-ventricular conduction block T wave abnormality, consider lateral ischemia Abnormal ECG  Assessment/Plan Principal Problem:   Sickle cell pain crisis ([place]) Admit to telemetry. Supplemental oxygen.  Defer IV hydration due to renal disease. Hydromorphone PCA. Antiemetics and antihistamines as needed. Folic acid 1 mg by mouth daily. Trend troponin levels. Follow-up CBC, reticulocyte count and CMP daily.  Active Problems:   Chest pain Pain seems to be atypical. Trend troponin levels. Continue cardiac monitoring.    Hypertension Continue amlodipine 10 mg by mouth daily. Continue corrected 12.5 mg by mouth twice a day. Also on furosemide 80 mg by mouth daily. Monitor blood pressure, renal function and electrolytes.    Type 2 diabetes mellitus with renal complication (HCC) Carbohydrate modified diet. Continue Lantus 20 units SQ at bedtime. Tradjenta 5 mg by mouth daily. CBG monitoring a regular insulin sliding scale.    Leukocytosis Follow-up WBCs.    Chronic kidney disease (CKD), stage IV (severe) (HCC) Renal diet with fluid restriction. Continue furosemide 80 mg by mouth daily. Monitor BUN, creatinine and electrolytes.     DVT prophylaxis: heparin SQ. Code Status: full code. Family Communication: his wife was present in the emergency department. Disposition Plan: admit to [facility] for pain control and troponin levels trending. Consults called:  Admission status:  inpatient/telemetry.   Reubin [name] MD [facility] Pager [phone]  If 7PM-7AM, please contact night-coverage [email or web address] Password [facility]  01/23/2017, 11:35 PM

## 2017-01-23 NOTE — ED Notes (Signed)
This RN attempted IV x 2.  Pt's family requesting IV team consult without additional attempts.  Hx of visits for same
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
## 2017-01-23 NOTE — ED Triage Notes (Signed)
Pt endorses sickle cell crisis that began this morning. Pt denies recent travel. Pt endorses intermittent chest pain, shob and lower back pain. VSS. Pt tachypneic in triage, breath sounds clear and spo2 100% on RA.

## 2017-01-23 NOTE — ED Notes (Signed)
EDP at bedside.  IV team also at bedside

## 2017-01-24 ENCOUNTER — Encounter (HOSPITAL_COMMUNITY): Payer: Self-pay | Admitting: Internal Medicine

## 2017-01-24 DIAGNOSIS — N184 Chronic kidney disease, stage 4 (severe): Secondary | ICD-10-CM

## 2017-01-24 DIAGNOSIS — I10 Essential (primary) hypertension: Secondary | ICD-10-CM

## 2017-01-24 DIAGNOSIS — E1121 Type 2 diabetes mellitus with diabetic nephropathy: Secondary | ICD-10-CM

## 2017-01-24 LAB — COMPREHENSIVE METABOLIC PANEL
ALK PHOS: 73 U/L (ref 38–126)
ALT: 12 U/L — AB (ref 17–63)
ANION GAP: 8 (ref 5–15)
AST: 27 U/L (ref 15–41)
Albumin: 4 g/dL (ref 3.5–5.0)
BILIRUBIN TOTAL: 1.8 mg/dL — AB (ref 0.3–1.2)
BUN: 45 mg/dL — ABNORMAL HIGH (ref 6–20)
CALCIUM: 9.3 mg/dL (ref 8.9–10.3)
CO2: 27 mmol/L (ref 22–32)
CREATININE: 4.2 mg/dL — AB (ref 0.61–1.24)
Chloride: 102 mmol/L (ref 101–111)
GFR, EST AFRICAN AMERICAN: 15 mL/min — AB (ref >60)
GFR, EST NON AFRICAN AMERICAN: 13 mL/min — AB (ref >60)
Glucose, Bld: 147 mg/dL — ABNORMAL HIGH (ref 65–99)
Potassium: 4.9 mmol/L (ref 3.5–5.1)
Sodium: 137 mmol/L (ref 135–145)
TOTAL PROTEIN: 7.7 g/dL (ref 6.5–8.1)

## 2017-01-24 LAB — GLUCOSE, CAPILLARY
GLUCOSE-CAPILLARY: 152 mg/dL — AB (ref 65–99)
GLUCOSE-CAPILLARY: 113 mg/dL — AB (ref 65–99)
Glucose-Capillary: 131 mg/dL — ABNORMAL HIGH (ref 65–99)
Glucose-Capillary: 89 mg/dL (ref 65–99)
Glucose-Capillary: 148 mg/dL — ABNORMAL HIGH (ref 65–99)

## 2017-01-24 LAB — RETICULOCYTES
RBC.: 3.45 MIL/uL — AB (ref 4.22–5.81)
RETIC CT PCT: 3.5 % — AB (ref 0.4–3.1)
Retic Count, Absolute: 120.8 10*3/uL (ref 19.0–186.0)

## 2017-01-24 LAB — CBC WITH DIFFERENTIAL/PLATELET
Basophils Absolute: 0 10*3/uL (ref 0.0–0.1)
Basophils Relative: 0 %
EOS ABS: 0.2 10*3/uL (ref 0.0–0.7)
EOS PCT: 1 %
HCT: 23.4 % — ABNORMAL LOW (ref 39.0–52.0)
HEMOGLOBIN: 7.8 g/dL — AB (ref 13.0–17.0)
LYMPHS ABS: 2.4 10*3/uL (ref 0.7–4.0)
Lymphocytes Relative: 12 %
MCH: 22.6 pg — AB (ref 26.0–34.0)
MCHC: 33.3 g/dL (ref 30.0–36.0)
MCV: 67.8 fL — AB (ref 78.0–100.0)
MONOS PCT: 19 %
Monocytes Absolute: 3.9 10*3/uL — ABNORMAL HIGH (ref 0.1–1.0)
NEUTROS ABS: 13.9 10*3/uL — AB (ref 1.7–7.7)
NRBC: 2 /100{WBCs} — AB
Neutrophils Relative %: 68 %
Platelets: 202 10*3/uL (ref 150–400)
RBC: 3.45 MIL/uL — ABNORMAL LOW (ref 4.22–5.81)
RDW: 20.7 % — ABNORMAL HIGH (ref 11.5–15.5)
WBC: 20.4 10*3/uL — AB (ref 4.0–10.5)

## 2017-01-24 LAB — TROPONIN I: Troponin I: <0.03 ng/mL (ref <0.03)

## 2017-01-24 MED ORDER — ONDANSETRON HCL 4 MG/2ML IJ SOLN: 4.0000 mg | Freq: Four times a day (QID) | INTRAMUSCULAR | Status: DC | PRN

## 2017-01-24 MED ORDER — AMLODIPINE BESYLATE 10 MG PO TABS
10.0000 mg | ORAL_TABLET | Freq: Every day | ORAL | Status: DC
  Administered 2017-01-24 – 2017-01-26 (×3): 10 mg via ORAL
  Filled 2017-01-24 (×3): qty 1

## 2017-01-24 MED ORDER — CALCIUM ACETATE (PHOS BINDER) 667 MG PO CAPS
667.0000 mg | ORAL_CAPSULE | Freq: Three times a day (TID) | ORAL | Status: DC
  Administered 2017-01-24 – 2017-01-26 (×8): 667 mg via ORAL
  Filled 2017-01-24 (×9): qty 1

## 2017-01-24 MED ORDER — HYDROMORPHONE 1 MG/ML IV SOLN
INTRAVENOUS | Status: DC
  Administered 2017-01-24: 1.6 mg via INTRAVENOUS
  Administered 2017-01-25: 1 mg via INTRAVENOUS
  Administered 2017-01-25: 0 mg via INTRAVENOUS

## 2017-01-24 MED ORDER — CALCIUM ACETATE (PHOS BINDER) 667 MG PO TABS: 667.0000 mg | ORAL_TABLET | Freq: Three times a day (TID) | ORAL | Status: DC

## 2017-01-24 MED ORDER — FOLIC ACID 1 MG PO TABS
1.0000 mg | ORAL_TABLET | Freq: Every day | ORAL | Status: DC
  Administered 2017-01-24 – 2017-01-26 (×3): 1 mg via ORAL
  Filled 2017-01-24 (×3): qty 1

## 2017-01-24 MED ORDER — INSULIN GLARGINE 100 UNIT/ML ~~LOC~~ SOLN
20.0000 [IU] | Freq: Every day | SUBCUTANEOUS | Status: DC
  Administered 2017-01-24 – 2017-01-25 (×2): 20 [IU] via SUBCUTANEOUS
  Filled 2017-01-24 (×3): qty 0.2

## 2017-01-24 MED ORDER — NALOXONE HCL 0.4 MG/ML IJ SOLN: 0.4000 mg | INTRAMUSCULAR | Status: DC | PRN

## 2017-01-24 MED ORDER — DIPHENHYDRAMINE HCL 25 MG PO CAPS
25.0000 mg | ORAL_CAPSULE | ORAL | Status: DC | PRN
  Administered 2017-01-24: 25 mg via ORAL
  Filled 2017-01-24: qty 1

## 2017-01-24 MED ORDER — FUROSEMIDE 40 MG PO TABS
80.0000 mg | ORAL_TABLET | Freq: Every day | ORAL | Status: DC
  Administered 2017-01-24 – 2017-01-26 (×3): 80 mg via ORAL
  Filled 2017-01-24 (×3): qty 2

## 2017-01-24 MED ORDER — HYDROMORPHONE HCL-NACL 0.5-0.9 MG/ML-% IV SOSY
1.0000 mg | PREFILLED_SYRINGE | Freq: Once | INTRAVENOUS | Status: AC
  Administered 2017-01-24: 1 mg via INTRAVENOUS

## 2017-01-24 MED ORDER — SODIUM CHLORIDE 0.9% FLUSH: 9.0000 mL | INTRAVENOUS | Status: DC | PRN

## 2017-01-24 MED ORDER — INSULIN ASPART 100 UNIT/ML ~~LOC~~ SOLN
0.0000 [IU] | Freq: Three times a day (TID) | SUBCUTANEOUS | Status: DC
  Administered 2017-01-24: 2 [IU] via SUBCUTANEOUS
  Administered 2017-01-24: 1 [IU] via SUBCUTANEOUS
  Administered 2017-01-25: 3 [IU] via SUBCUTANEOUS
  Administered 2017-01-26: 2 [IU] via SUBCUTANEOUS

## 2017-01-24 MED ORDER — DIPHENHYDRAMINE HCL 50 MG/ML IJ SOLN: 12.5000 mg | Freq: Four times a day (QID) | INTRAMUSCULAR | Status: DC | PRN

## 2017-01-24 MED ORDER — POLYETHYLENE GLYCOL 3350 17 G PO PACK
17.0000 g | PACK | Freq: Every day | ORAL | Status: DC | PRN
  Administered 2017-01-26: 17 g via ORAL
  Filled 2017-01-24: qty 1

## 2017-01-24 MED ORDER — SODIUM CHLORIDE 0.9 % IV SOLN
25.0000 mg | INTRAVENOUS | Status: DC | PRN
  Filled 2017-01-24: qty 0.5

## 2017-01-24 MED ORDER — HYDROMORPHONE 1 MG/ML IV SOLN
INTRAVENOUS | Status: DC
  Administered 2017-01-24: 03:00:00 via INTRAVENOUS
  Administered 2017-01-24: 4.1 mg via INTRAVENOUS
  Administered 2017-01-24: 2.7 mg via INTRAVENOUS

## 2017-01-24 MED ORDER — LINAGLIPTIN 5 MG PO TABS
5.0000 mg | ORAL_TABLET | Freq: Every day | ORAL | Status: DC
  Administered 2017-01-24 – 2017-01-26 (×3): 5 mg via ORAL
  Filled 2017-01-24 (×3): qty 1

## 2017-01-24 MED ORDER — ACETAMINOPHEN 500 MG PO TABS
500.0000 mg | ORAL_TABLET | Freq: Four times a day (QID) | ORAL | Status: DC | PRN
Start: 2017-01-24 — End: 2017-01-26

## 2017-01-24 MED ORDER — CARVEDILOL 12.5 MG PO TABS
12.5000 mg | ORAL_TABLET | Freq: Two times a day (BID) | ORAL | Status: DC
  Administered 2017-01-24 – 2017-01-26 (×5): 12.5 mg via ORAL
  Filled 2017-01-24 (×5): qty 1

## 2017-01-24 MED ORDER — POLYETHYLENE GLYCOL 3350 17 GM/SCOOP PO POWD: 17.0000 g | Freq: Every day | ORAL | Status: DC | PRN

## 2017-01-24 MED ORDER — HEPARIN SODIUM (PORCINE) 5000 UNIT/ML IJ SOLN
5000.0000 [IU] | Freq: Three times a day (TID) | INTRAMUSCULAR | Status: DC
  Administered 2017-01-24 – 2017-01-25 (×6): 5000 [IU] via SUBCUTANEOUS
  Filled 2017-01-24 (×6): qty 1

## 2017-01-24 MED ORDER — ONDANSETRON HCL 4 MG PO TABS: 4.0000 mg | ORAL_TABLET | ORAL | Status: DC | PRN

## 2017-01-24 MED ORDER — HEPARIN SODIUM (PORCINE) 5000 UNIT/ML IJ SOLN: 5000.0000 [IU] | Freq: Three times a day (TID) | INTRAMUSCULAR | Status: DC

## 2017-01-24 MED ORDER — INSULIN GLARGINE 100 UNITS/ML SOLOSTAR PEN: 20.0000 [IU] | PEN_INJECTOR | Freq: Every day | SUBCUTANEOUS | Status: DC

## 2017-01-24 MED ORDER — HYDROMORPHONE HCL 1 MG/ML IJ SOLN
1.0000 mg | Freq: Once | INTRAMUSCULAR | Status: AC
  Administered 2017-01-24: 1 mg via INTRAVENOUS
  Filled 2017-01-24: qty 1

## 2017-01-24 MED ORDER — DIPHENHYDRAMINE HCL 12.5 MG/5ML PO ELIX: 12.5000 mg | ORAL_SOLUTION | Freq: Four times a day (QID) | ORAL | Status: DC | PRN

## 2017-01-24 MED ORDER — ONDANSETRON HCL 4 MG/2ML IJ SOLN: 4.0000 mg | INTRAMUSCULAR | Status: DC | PRN

## 2017-01-24 MED ORDER — SENNA 8.6 MG PO TABS
1.0000 | ORAL_TABLET | Freq: Two times a day (BID) | ORAL | Status: DC
  Administered 2017-01-24 – 2017-01-26 (×4): 8.6 mg via ORAL
  Filled 2017-01-24 (×6): qty 1

## 2017-01-24 MED ORDER — HYDROMORPHONE HCL 1 MG/ML IJ SOLN
1.0000 mg | INTRAMUSCULAR | Status: AC | PRN
  Administered 2017-01-24 (×2): 1 mg via INTRAVENOUS
  Filled 2017-01-24 (×2): qty 1

## 2017-01-24 MED ORDER — PANTOPRAZOLE SODIUM 40 MG PO TBEC
40.0000 mg | DELAYED_RELEASE_TABLET | Freq: Every day | ORAL | Status: DC
  Administered 2017-01-24 – 2017-01-26 (×3): 40 mg via ORAL
  Filled 2017-01-24 (×3): qty 1

## 2017-01-24 MED ORDER — GABAPENTIN 100 MG PO CAPS
100.0000 mg | ORAL_CAPSULE | Freq: Three times a day (TID) | ORAL | Status: DC
  Administered 2017-01-24 – 2017-01-26 (×7): 100 mg via ORAL
  Filled 2017-01-24 (×7): qty 1

## 2017-01-24 NOTE — Progress Notes (Signed)
SICKLE CELL SERVICE PROGRESS NOTE  [name] JAS:[id number] DOB: 12/02/1950 DOA: 01/23/2017 PCP: Nolene Ebbs, MD  Assessment/Plan: Principal Problem:   Sickle cell pain crisis ([place]) Active Problems:   Hypertension   Type 2 diabetes mellitus with renal complication (HCC)   Chest pain   Leukocytosis   Chronic kidney disease (CKD), stage IV (severe) ([place])  1. Sickle cell crisis: PCA was adjusted to bolus dose of 0.5, lockout interval 10 and 1 hour limit of 3 mg, continue IV fluids. Patient unable to receive NSAIDs due to her chronic kidney disease. Continue folic acid. 2. CK D stage IV: BUN and creatinine at baseline. Patient having adequate urine output and no evidence of fluid retention, respiratory embarrassment or ascites. Patient is continued on PhosLo and Lasix. 3. Diabetes type 2 with kidney disease: Patient is continued on gentle and sliding scale insulin. Lantus held while hospitalized. His blood sugars are well-controlled 4. Anemia of chronic disease: Patient's hemoglobin is at baseline. No indication for transfusion. 5.   Code Status: Full Code Family Communication: N/A Disposition Plan: Not yet ready for discharge  [place].  Pager [phone]. If 7PM-7AM, please contact night-coverage.  01/24/2017, 2:32 PM  LOS: 1 day   Interim History: Patient states that his pain is still at about 7-8 out of 10. Pain is localized to legs and low back. He reports that since adjusting the PCA dosing his pain is starting to decrease which is now down from a 10 out of 10 earlier this morning.  Consultants:  None  Procedures:  None  Antibiotics:  None    Objective: Vitals:   01/24/17 0307 01/24/17 0512 01/24/17 0750 01/24/17 1302  BP:  (!) 148/77    Pulse:  86    Resp: 18 (!) 22 18 18   Temp:  99.9 F (37.7 C)    TempSrc:  Oral    SpO2: 97% 97% 97% 97%  Weight:      Height:       Weight change:   Intake/Output Summary (Last 24 hours) at 01/24/17  1432 Last data filed at 01/24/17 1150  Gross per 24 hour  Intake                0 ml  Output              550 ml  Net             -550 ml     Physical Exam General: Alert, awake, oriented x 3. In mild distress due to pain.  HEENT: [place]/AT PEERL, EOMI, anicteric Neck: Trachea midline,  no masses, no thyromegal,y no JVD, no carotid bruit OROPHARYNX:  Moist, No exudate/ erythema/lesions.  Heart: Regular rate and rhythm, without murmurs, rubs, gallops, PMI non-displaced, no heaves or thrills on palpation.  Lungs: Clear to auscultation, no wheezing or rhonchi noted. No increased vocal fremitus resonant to percussion  Abdomen: Soft, nontender, nondistended, positive bowel sounds, no masses no hepatosplenomegaly noted..  Neuro: No focal neurological deficits noted cranial nerves II through XII grossly intact. Strength at functional baseline in bilateral upper and lower extremities. Musculoskeletal: No warmth swelling or erythema around joints, no spinal tenderness noted. Psychiatric: Patient alert and oriented x3, good insight and cognition, good recent to remote recall.     Data Reviewed: Basic Metabolic Panel:  Recent Labs Lab 01/23/17 1800 01/24/17 0715  NA 138 137  K 5.0 4.9  CL 102 102  CO2 20* 27  GLUCOSE 154* 147*  BUN 41*  45*  CREATININE 4.36* 4.20*  CALCIUM 9.7 9.3   Liver Function Tests:  Recent Labs Lab 01/23/17 1800 01/24/17 0715  AST 31 27  ALT 7* 12*  ALKPHOS 78 73  BILITOT 1.9* 1.8*  PROT 8.4* 7.7  ALBUMIN 4.2 4.0   No results for input(s): LIPASE, AMYLASE in the last 168 hours. No results for input(s): AMMONIA in the last 168 hours. CBC:  Recent Labs Lab 01/23/17 1800 01/24/17 0715  WBC 17.4* 20.4*  NEUTROABS 12.2* 13.9*  HGB 8.6* 7.8*  HCT 26.6* 23.4*  MCV 67.7* 67.8*  PLT 205 202   Cardiac Enzymes:  Recent Labs Lab 01/24/17 0715  TROPONINI <0.03   BNP (last 3 results)  Recent Labs  01/23/17 1947  BNP 569.9*    ProBNP (last 3  results) No results for input(s): PROBNP in the last 8760 hours.  CBG:  Recent Labs Lab 01/24/17 0237 01/24/17 0737 01/24/17 1148  GLUCAP 131* 152* 89    No results found for this or any previous visit (from the past 240 hour(s)).   Studies: Dg Chest 2 View  Result Date: 01/23/2017 CLINICAL DATA:  Chest pain. EXAM: CHEST  2 VIEW COMPARISON:  December 26, 2016 FINDINGS: The heart, hila, mediastinum, lungs, and pleura are unremarkable. Sclerotic bones are seen and stable consistent with sickle cell disease. IMPRESSION: Evidence of sickle cell disease.  No acute abnormalities. Electronically Signed   By: Dorise Bullion III M.D   On: 01/23/2017 18:22   Dg Chest 2 View  Result Date: 12/26/2016 CLINICAL DATA:  Cough and fever for 2 days EXAM: CHEST  2 VIEW COMPARISON:  11/08/2016 FINDINGS: Cardiac shadow is mildly enlarged. Mild vascular prominence is again seen and stable. This may be related to a component of volume overload. No focal infiltrate is seen. Sclerotic changes are noted throughout the bony structures which may be related to the underlying renal disease. IMPRESSION: Mild vascular prominence which may be related to volume overload. No focal infiltrate is seen. Sclerotic changes in the bone likely related to the underlying renal disease. Electronically Signed   By: Inez Catalina M.D.   On: 12/26/2016 22:44    Scheduled Meds: . amLODipine  10 mg Oral Daily  . calcium acetate  667 mg Oral TID WC  . carvedilol  12.5 mg Oral BID  . folic acid  1 mg Oral Daily  . furosemide  80 mg Oral Daily  . gabapentin  100 mg Oral TID  . heparin  5,000 Units Subcutaneous Q8H  . HYDROmorphone   Intravenous Q4H  .  HYDROmorphone (DILAUDID) injection  1 mg Intravenous Once  . insulin aspart  0-9 Units Subcutaneous TID WC  . insulin glargine  20 Units Subcutaneous QHS  . linagliptin  5 mg Oral Daily  . pantoprazole  40 mg Oral Daily  . senna  1 tablet Oral BID   Continuous Infusions: .  diphenhydrAMINE (BENADRYL) IVPB(SICKLE CELL ONLY)      Principal Problem:   Sickle cell pain crisis (HCC) Active Problems:   Hypertension   Type 2 diabetes mellitus with renal complication (HCC)   Chest pain   Leukocytosis   Chronic kidney disease (CKD), stage IV (severe) (HCC)       In excess of 25 minutes spent during this visit. Greater than 50% involved face to face contact with the patient for assessment, counseling and coordination of care.

## 2017-01-24 NOTE — ED Notes (Signed)
[facility] at bedside 

## 2017-01-24 NOTE — ED Notes (Signed)
Patient's wife expressing frustrations with delays in doctors, pain medication, and patient's care.  This Rn apologized for me and delays for Dr. Olevia Bowens.  Patient's wife stating "if this doesn't hurry up, I'm about to discharge him and take him home".  This RN encouraged her not to do so, and that she would page Dr. Olevia Bowens.

## 2017-01-24 NOTE — Care Management Note (Signed)
Case Management Note  Patient Details  Name: [name] MRN: [id number] Date of Birth: March 09, 1951  Subjective/Objective:      Sickle cell crisis              Action/Plan: Date:  January 24, 2017 Chart reviewed for concurrent status and case management needs. Will continue to follow patient progress. Discharge Planning: following for needs Expected discharge date: [id number] Velva Harman, BSN, [place], [place]  Expected Discharge Date:                  Expected Discharge Plan:  Home/Self Care  In-House Referral:     Discharge planning Services  CM Consult  Post Acute Care Choice:    Choice offered to:     DME Arranged:    DME Agency:     HH Arranged:    HH Agency:     Status of Service:  In process, will continue to follow  If discussed at Long Length of Stay Meetings, dates discussed:    Additional Comments:  Leeroy Cha, RN 01/24/2017, 8:58 AM

## 2017-01-24 NOTE — ED Notes (Addendum)
[facility] called by Network engineer for pickup

## 2017-01-24 NOTE — ED Notes (Signed)
MD at bedside. 

## 2017-01-25 DIAGNOSIS — D638 Anemia in other chronic diseases classified elsewhere: Secondary | ICD-10-CM

## 2017-01-25 LAB — GLUCOSE, CAPILLARY
GLUCOSE-CAPILLARY: 119 mg/dL — AB (ref 65–99)
GLUCOSE-CAPILLARY: 201 mg/dL — AB (ref 65–99)
GLUCOSE-CAPILLARY: 108 mg/dL — AB (ref 65–99)
Glucose-Capillary: 153 mg/dL — ABNORMAL HIGH (ref 65–99)

## 2017-01-25 LAB — BASIC METABOLIC PANEL
ANION GAP: 9 (ref 5–15)
BUN: 55 mg/dL — ABNORMAL HIGH (ref 6–20)
CHLORIDE: 99 mmol/L — AB (ref 101–111)
CO2: 26 mmol/L (ref 22–32)
CREATININE: 4.43 mg/dL — AB (ref 0.61–1.24)
Calcium: 9.3 mg/dL (ref 8.9–10.3)
GFR calc non Af Amer: 12 mL/min — ABNORMAL LOW (ref >60)
GFR, EST AFRICAN AMERICAN: 14 mL/min — AB (ref >60)
Glucose, Bld: 213 mg/dL — ABNORMAL HIGH (ref 65–99)
POTASSIUM: 4.3 mmol/L (ref 3.5–5.1)
SODIUM: 134 mmol/L — AB (ref 135–145)

## 2017-01-25 LAB — CBC WITH DIFFERENTIAL/PLATELET
BASOS ABS: 0 10*3/uL (ref 0.0–0.1)
Basophils Relative: 0 %
Eosinophils Absolute: 0.4 10*3/uL (ref 0.0–0.7)
Eosinophils Relative: 2 %
HCT: 24.4 % — ABNORMAL LOW (ref 39.0–52.0)
HEMOGLOBIN: 8.1 g/dL — AB (ref 13.0–17.0)
LYMPHS PCT: 16 %
Lymphs Abs: 2.9 10*3/uL (ref 0.7–4.0)
MCH: 22.3 pg — ABNORMAL LOW (ref 26.0–34.0)
MCHC: 33.2 g/dL (ref 30.0–36.0)
MCV: 67.2 fL — ABNORMAL LOW (ref 78.0–100.0)
MONOS PCT: 16 %
Monocytes Absolute: 2.9 10*3/uL — ABNORMAL HIGH (ref 0.1–1.0)
Neutro Abs: 11.9 10*3/uL — ABNORMAL HIGH (ref 1.7–7.7)
Neutrophils Relative %: 66 %
Platelets: 193 10*3/uL (ref 150–400)
RBC: 3.63 MIL/uL — AB (ref 4.22–5.81)
RDW: 20.6 % — ABNORMAL HIGH (ref 11.5–15.5)
WBC: 18.1 10*3/uL — AB (ref 4.0–10.5)

## 2017-01-25 MED ORDER — OXYCODONE-ACETAMINOPHEN 5-325 MG PO TABS
1.0000 | ORAL_TABLET | ORAL | Status: DC | PRN
Start: 2017-01-25 — End: 2017-01-26
  Administered 2017-01-25 – 2017-01-26 (×3): 1 via ORAL
  Filled 2017-01-25 (×3): qty 1

## 2017-01-25 MED ORDER — OXYCODONE-ACETAMINOPHEN 5-325 MG PO TABS
1.0000 | ORAL_TABLET | Freq: Once | ORAL | Status: AC
  Administered 2017-01-25: 1 via ORAL
  Filled 2017-01-25: qty 1

## 2017-01-25 MED ORDER — OXYCODONE-ACETAMINOPHEN 5-325 MG PO TABS: 1.0000 | ORAL_TABLET | ORAL | Status: DC | PRN

## 2017-01-25 NOTE — Progress Notes (Signed)
SICKLE CELL SERVICE PROGRESS NOTE  [name] KGU:[id number] DOB: 1951/05/14 DOA: 01/23/2017 PCP: Justin Ebbs, MD  Assessment/Plan: Principal Problem:   Sickle cell pain crisis ([place]) Active Problems:   Hypertension   Type 2 diabetes mellitus with renal complication (HCC)   Chest pain   Leukocytosis   Chronic kidney disease (CKD), stage IV (severe) ([place])  1. Hb  with crisis: Will schedule Percocet on a PRN basis and instructed patient to preferentially request Percocet 1 every 4 hour basis over use of the PCA. Will continue the PCA to be used in conjunction with oral medications. Decrease IV fluids to [facility] as patient eating and drinking well. 2. CKD stage IV: Continue PhosLo and Lasix. Renal function indices at baseline. 3. Diabetes type 2: Continue patient on Tradjenta and SSI. Resume Lantus as patient eating better.  4. Anemia of Chronic Disease: Hb at baseline.   Code Status: Full Code Family Communication: N/A Disposition Plan: Anticipate discharge tomorrow.   [name] A.  Pager [phone]. If 7PM-7AM, please contact night-coverage.  01/25/2017, 1:56 PM  LOS: 2 days   Interim History: Pt reports that pain has decrease significantly. He rates pain as 5-610 compared to baseline pain of 4/10. He has used 10 mg on the PCA in the last 24 hours. Pt walked today with no significant increase in pain.  Consultants:  None  Procedures:  None  Antibiotics:  None   Objective: Vitals:   01/25/17 0406 01/25/17 0909 01/25/17 0910 01/25/17 1200  BP:  (!) 149/82    Pulse:  76    Resp: 12  15 12   Temp:      TempSrc:      SpO2: 98%  99% 100%  Weight:      Height:       Weight change:   Intake/Output Summary (Last 24 hours) at 01/25/17 1356 Last data filed at 01/25/17 1030  Gross per 24 hour  Intake              480 ml  Output             1025 ml  Net             -545 ml      Physical Exam General: Alert, awake, oriented x3, in no acute distress.   HEENT: [place]/AT PEERL, EOMI, anicteric Neck: Trachea midline,  no masses, no thyromegal,y no JVD, no carotid bruit OROPHARYNX:  Moist, No exudate/ erythema/lesions.  Heart: Regular rate and rhythm, without murmurs, rubs, gallops, PMI non-displaced, no heaves or thrills on palpation.  Lungs: Clear to auscultation, no wheezing or rhonchi noted. No increased vocal fremitus resonant to percussion  Abdomen: Soft, nontender, nondistended, positive bowel sounds, no masses no hepatosplenomegaly noted.  Neuro: No focal neurological deficits noted cranial nerves II through XII grossly intact. Strength at baseline in bilateral upper and lower extremities. Musculoskeletal: No warm swelling or erythema around joints, no spinal tenderness noted. Psychiatric: Patient alert and oriented x3, good insight and cognition, good recent to remote recall. .   Data Reviewed: Basic Metabolic Panel:  Recent Labs Lab 01/23/17 1800 01/24/17 0715 01/25/17 1239  NA 138 137 134*  K 5.0 4.9 4.3  CL 102 102 99*  CO2 20* 27 26  GLUCOSE 154* 147* 213*  BUN 41* 45* 55*  CREATININE 4.36* 4.20* 4.43*  CALCIUM 9.7 9.3 9.3   Liver Function Tests:  Recent Labs Lab 01/23/17 1800 01/24/17 0715  AST 31 27  ALT 7* 12*  ALKPHOS  78 73  BILITOT 1.9* 1.8*  PROT 8.4* 7.7  ALBUMIN 4.2 4.0   No results for input(s): LIPASE, AMYLASE in the last 168 hours. No results for input(s): AMMONIA in the last 168 hours. CBC:  Recent Labs Lab 01/23/17 1800 01/24/17 0715 01/25/17 1239  WBC 17.4* 20.4* 18.1*  NEUTROABS 12.2* 13.9* PENDING  HGB 8.6* 7.8* 8.1*  HCT 26.6* 23.4* 24.4*  MCV 67.7* 67.8* 67.2*  PLT 205 202 193   Cardiac Enzymes:  Recent Labs Lab 01/24/17 0715  TROPONINI <0.03   BNP (last 3 results)  Recent Labs  01/23/17 1947  BNP 569.9*    ProBNP (last 3 results) No results for input(s): PROBNP in the last 8760 hours.  CBG:  Recent Labs Lab 01/24/17 1148 01/24/17 1657 01/24/17 2055  01/25/17 0747 01/25/17 1204  GLUCAP 89 148* 113* 119* 201*    No results found for this or any previous visit (from the past 240 hour(s)).   Studies: Dg Chest 2 View  Result Date: 01/23/2017 CLINICAL DATA:  Chest pain. EXAM: CHEST  2 VIEW COMPARISON:  December 26, 2016 FINDINGS: The heart, hila, mediastinum, lungs, and pleura are unremarkable. Sclerotic bones are seen and stable consistent with sickle cell disease. IMPRESSION: Evidence of sickle cell disease.  No acute abnormalities. Electronically Signed   By: Dorise Bullion III M.D   On: 01/23/2017 18:22   Dg Chest 2 View  Result Date: 12/26/2016 CLINICAL DATA:  Cough and fever for 2 days EXAM: CHEST  2 VIEW COMPARISON:  11/08/2016 FINDINGS: Cardiac shadow is mildly enlarged. Mild vascular prominence is again seen and stable. This may be related to a component of volume overload. No focal infiltrate is seen. Sclerotic changes are noted throughout the bony structures which may be related to the underlying renal disease. IMPRESSION: Mild vascular prominence which may be related to volume overload. No focal infiltrate is seen. Sclerotic changes in the bone likely related to the underlying renal disease. Electronically Signed   By: Inez Catalina M.D.   On: 12/26/2016 22:44    Scheduled Meds: . amLODipine  10 mg Oral Daily  . calcium acetate  667 mg Oral TID WC  . carvedilol  12.5 mg Oral BID  . folic acid  1 mg Oral Daily  . furosemide  80 mg Oral Daily  . gabapentin  100 mg Oral TID  . heparin  5,000 Units Subcutaneous Q8H  . insulin aspart  0-9 Units Subcutaneous TID WC  . insulin glargine  20 Units Subcutaneous QHS  . linagliptin  5 mg Oral Daily  . pantoprazole  40 mg Oral Daily  . senna  1 tablet Oral BID   Continuous Infusions:  Principal Problem:   Sickle cell pain crisis ([place]) Active Problems:   Hypertension   Type 2 diabetes mellitus with renal complication (HCC)   Chest pain   Leukocytosis   Chronic kidney disease (CKD),  stage IV (severe) (HCC)    In excess of 25 minutes spent during this visit. Greater than 50% involved face to face contact with the patient for assessment, counseling and coordination of care.

## 2017-01-25 NOTE — Progress Notes (Signed)
Patient's Dilaudid PCA pump was discontinued. Wasted in the sink with Antonieta Pert, RN. We looked to waste it in the Pyxis but it was never pulled from there, pharmacy must have sent it up.  43ml of dilaudid was wasted.

## 2017-01-26 LAB — GLUCOSE, CAPILLARY
GLUCOSE-CAPILLARY: 96 mg/dL (ref 65–99)
Glucose-Capillary: 153 mg/dL — ABNORMAL HIGH (ref 65–99)

## 2017-01-26 NOTE — Discharge Summary (Signed)
[name] MRN: [id number] DOB/AGE: 05-Nov-1950 66 y.o.  Admit date: 01/23/2017 Discharge date: 01/26/2017  Primary Care Physician:  Nolene Ebbs, MD   Discharge Diagnoses:   Patient Active Problem List   Diagnosis Date Noted  . (resolved) Sickle cell pain crisis ([place]) 01/23/2017  . Chronic kidney disease (CKD), stage IV (severe) ([place]) 03/26/2016  . Hb-SS disease without crisis ([place])   . Leukocytosis 07/28/2011  . LBBB (left bundle branch block) 07/28/2011  . Hypertension   . Type 2 diabetes mellitus with renal complication (HCC)     DISCHARGE MEDICATION: Allergies as of 01/26/2017      Reactions   Morphine And Related Itching      Medication List    STOP taking these medications   levofloxacin 500 MG tablet Commonly known as:  LEVAQUIN     TAKE these medications   acetaminophen 500 MG tablet Commonly known as:  TYLENOL Take 500 mg by mouth every 6 (six) hours as needed for headache (pain).   amLODipine 10 MG tablet Commonly known as:  NORVASC Take 1 tablet (10 mg total) by mouth daily.   calcium acetate 667 MG tablet Commonly known as:  PHOSLO Take 667 mg by mouth 3 (three) times daily.   carvedilol 12.5 MG tablet Commonly known as:  COREG Take 12.5 mg by mouth 2 (two) times daily.   folic acid 1 MG tablet Commonly known as:  FOLVITE Take 1 tablet (1 mg total) by mouth daily.   furosemide 80 MG tablet Commonly known as:  LASIX Take 1 tablet (80 mg total) by mouth daily.   gabapentin 100 MG capsule Commonly known as:  NEURONTIN Take 1 capsule (100 mg total) by mouth 3 (three) times daily.   insulin glargine 100 unit/mL Sopn Commonly known as:  LANTUS Inject 20 Units into the skin at bedtime.   NOVOLOG FLEXPEN 100 UNIT/ML FlexPen Generic drug:  insulin aspart Inject 2-6 Units into the skin 3 (three) times daily as needed for high blood sugar (CBG >150).   omeprazole 20 MG capsule Commonly known as:  PRILOSEC Take 20 mg by mouth daily.    oxyCODONE 5 MG immediate release tablet Commonly known as:  ROXICODONE Take 1 tablet (5 mg total) by mouth every 6 (six) hours as needed. What changed:  reasons to take this   polyethylene glycol powder powder Commonly known as:  MIRALAX Take 17 g by mouth daily. What changed:  when to take this  reasons to take this  additional instructions   saxagliptin HCl 5 MG Tabs tablet Commonly known as:  ONGLYZA Take 0.5 tablets (2.5 mg total) by mouth daily.   SENNA LAXATIVE PO Take 1 tablet by mouth daily as needed (constipation).   STOOL SOFTENER PO Take 1 capsule by mouth daily as needed (constipation).         Consults:    SIGNIFICANT DIAGNOSTIC STUDIES:  Dg Chest 2 View  Result Date: 01/23/2017 CLINICAL DATA:  Chest pain. EXAM: CHEST  2 VIEW COMPARISON:  December 26, 2016 FINDINGS: The heart, hila, mediastinum, lungs, and pleura are unremarkable. Sclerotic bones are seen and stable consistent with sickle cell disease. IMPRESSION: Evidence of sickle cell disease.  No acute abnormalities. Electronically Signed   By: Dorise Bullion III M.D   On: 01/23/2017 18:22      No results found for this or any previous visit (from the past 240 hour(s)).  BRIEF ADMITTING H & P: [name] is a 66 y.o. male with  medical history significant of type 2 diabetes, GERD, heart murmur, hypertension, pneumonia, chronic kidney disease stage IV, sickle cell anemia who is coming to the emergency department with complaints of having a sickle cell pain crisis which is mostly affecting his back and extremities. He also complains of right sided chest pain associated with shortness of breath. He denies fever, chills, sore throat, dizziness, diaphoresis, PND or orthopnea. He denies nausea or vomiting, but complains of crampy abdominal pain associated with 4 episodes of diarrhea earlier in the day. He denies dysuria, frequency, hematuria, polyuria, polydipsia or blurred vision.  ED Course: the patient  received 3 rounds of hydromorphone 1 mg IVP and normal saline 558mL 1 dose. His workup showed WBC of 12.4, hemoglobin 8.6 g/dL and platelets 205. His sodium was 138, potassium 5.0, chloride 102 and bicarbonate 20 mmol/L. BUN 41, creatinine 4.36 and glucose 154 mg/dL.   Hospital Course:  Present on Admission: . Sickle cell pain crisis ([place]) . Chronic kidney disease (CKD), stage IV (severe) ([place]) . Leukocytosis . Type 2 diabetes mellitus with renal complication (HCC) . Hypertension . Chest pain  This is an opiate tolerant with hemoglobin [place], CK-MB stage IV, diabetes type 2, anemia of chronic disease, hypertension who was admitted with sickle cell pain crisis. His chronic issues remained class and throughout the hospitalization and he was continued on his chronic medications without interruption except for Lantus with his health on the first day due to the patient's inconsistent oral intake but these immediately resumed once his oral intake was consistent.  His sickle cell crisis was managed with Dilaudid via the PCA and IV fluids. The course was essentially uncomplicated and the patient was quickly transitioned his usual oral medications with good control of his pain. At the time of discharge the patient's pain level was intermittently  2-4/10 which is baseline intensity of pain for this patient. And he was discharged home on his prehospital regimen of medications. He was unable to receive any NSAIDs for pain management due to his chronic kidney disease.  Patient also has an anemia of chronic disease. His hemoglobin remained stable throughout his hospital course and there was no indication for transfusions during his hospitalization.  The patient's blood sugars and blood pressures remained controlled throughout his hospital stay. He was maintained on Tradjenta as a formulary substitute for Onglyza, Lantus and sliding scale insulin. 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 His pain is well-controlled and he is being discharged in stable condition.  Disposition and Follow-up: Patient is discharged home in stable condition and is to follow-up with Dr. Jeanie Cooks as needed regarding his sickle cell. He also has appointments for follow-up scheduled to see his nephrologist.   DISCHARGE EXAM: General: Alert, awake, oriented x3, in no apparent distress.  HEENT: [place]/AT PEERL, EOMI, anicteric Neck: Trachea midline, no masses, no thyromegal,y no JVD, no carotid bruit OROPHARYNX: Moist, No exudate/ erythema/lesions.  Heart: Regular rate and rhythm, without murmurs, rubs, gallops or S3. PMI non-displaced. Exam reveals no decreased pulses. Pulmonary/Chest: Normal effort. Breath sounds normal. No. Apnea. Clear to auscultation,no stridor,  no wheezing and no rhonchi noted. No respiratory distress and no tenderness noted. Abdomen: Soft, nontender, nondistended, normal bowel sounds, no masses no hepatosplenomegaly noted. No fluid wave and no ascites. There is no guarding or rebound. Neuro: Alert and oriented to person, place and time. Normal motor skills, Displays no atrophy or tremors and exhibits normal muscle tone.  No focal neurological deficits noted  cranial nerves II through XII grossly intact. No sensory deficit noted. Strength at baseline in bilateral upper and lower extremities. Gait normal. Musculoskeletal: No warmth swelling or erythema around joints, no spinal tenderness noted. Psychiatric: Patient alert and oriented x3, good insight and cognition, good recent to remote recall. Patient displays a jovial affect Lymph node survey: No cervical axillary or inguinal lymphadenopathy noted. Skin: Skin is warm and dry. No bruising, no ecchymosis and no rash noted. Pt is not diaphoretic. No erythema. No pallor    Blood pressure 128/69, pulse 72, temperature 99.5 F (37.5 C), temperature source Oral, resp. rate 18, height 6\' 1"  (1.854 m), weight 83.9 kg (185 lb), SpO2  94 %.   Recent Labs  01/24/17 0715 01/25/17 1239  NA 137 134*  K 4.9 4.3  CL 102 99*  CO2 27 26  GLUCOSE 147* 213*  BUN 45* 55*  CREATININE 4.20* 4.43*  CALCIUM 9.3 9.3    Recent Labs  01/23/17 1800 01/24/17 0715  AST 31 27  ALT 7* 12*  ALKPHOS 78 73  BILITOT 1.9* 1.8*  PROT 8.4* 7.7  ALBUMIN 4.2 4.0   No results for input(s): LIPASE, AMYLASE in the last 72 hours.  Recent Labs  01/24/17 0715 01/25/17 1239  WBC 20.4* 18.1*  NEUTROABS 13.9* 11.9*  HGB 7.8* 8.1*  HCT 23.4* 24.4*  MCV 67.8* 67.2*  PLT 202 193     Total time spent including face to face and decision making was greater than 30 minutes  Signed: Sears Oran A. 01/26/2017, 12:51 PM

## 2017-01-26 NOTE — Progress Notes (Signed)
Patient given discharge instructions, and verbalized an understanding of all discharge instructions.  Patient agrees with discharge plan, and is being discharged in stable medical condition.  Patient given transportation via wheelchair. 

## 2017-01-26 NOTE — Progress Notes (Signed)
Date: January 26, 2017 Chart reviewed for discharge orders: None found for case management. Mehdi Gironda,BSN,RN3, CCM/[phone] 

## 2017-03-26 ENCOUNTER — Emergency Department (HOSPITAL_COMMUNITY): Payer: Medicare HMO

## 2017-03-26 ENCOUNTER — Other Ambulatory Visit: Payer: Self-pay

## 2017-03-26 ENCOUNTER — Encounter (HOSPITAL_COMMUNITY): Payer: Self-pay | Admitting: *Deleted

## 2017-03-26 ENCOUNTER — Inpatient Hospital Stay (HOSPITAL_COMMUNITY)
Admission: EM | Admit: 2017-03-26 | Discharge: 2017-04-02 | DRG: 812 | Disposition: A | Discharge status: Home Health | Payer: Medicare HMO | Attending: Internal Medicine | Admitting: Internal Medicine

## 2017-03-26 DIAGNOSIS — D509 Iron deficiency anemia, unspecified: Secondary | ICD-10-CM

## 2017-03-26 DIAGNOSIS — E1122 Type 2 diabetes mellitus with diabetic chronic kidney disease: Secondary | ICD-10-CM | POA: Diagnosis present

## 2017-03-26 DIAGNOSIS — R338 Other retention of urine: Secondary | ICD-10-CM | POA: Clinically undetermined

## 2017-03-26 DIAGNOSIS — E875 Hyperkalemia: Secondary | ICD-10-CM | POA: Diagnosis present

## 2017-03-26 DIAGNOSIS — Z87891 Personal history of nicotine dependence: Secondary | ICD-10-CM

## 2017-03-26 DIAGNOSIS — K5900 Constipation, unspecified: Secondary | ICD-10-CM

## 2017-03-26 DIAGNOSIS — E1121 Type 2 diabetes mellitus with diabetic nephropathy: Secondary | ICD-10-CM

## 2017-03-26 DIAGNOSIS — Z794 Long term (current) use of insulin: Secondary | ICD-10-CM

## 2017-03-26 DIAGNOSIS — D649 Anemia, unspecified: Secondary | ICD-10-CM

## 2017-03-26 DIAGNOSIS — I129 Hypertensive chronic kidney disease with stage 1 through stage 4 chronic kidney disease, or unspecified chronic kidney disease: Secondary | ICD-10-CM | POA: Diagnosis present

## 2017-03-26 DIAGNOSIS — N139 Obstructive and reflux uropathy, unspecified: Secondary | ICD-10-CM | POA: Clinically undetermined

## 2017-03-26 DIAGNOSIS — E872 Acidosis: Secondary | ICD-10-CM | POA: Diagnosis present

## 2017-03-26 DIAGNOSIS — E871 Hypo-osmolality and hyponatremia: Secondary | ICD-10-CM | POA: Diagnosis present

## 2017-03-26 DIAGNOSIS — Z9841 Cataract extraction status, right eye: Secondary | ICD-10-CM

## 2017-03-26 DIAGNOSIS — Z885 Allergy status to narcotic agent status: Secondary | ICD-10-CM

## 2017-03-26 DIAGNOSIS — F1410 Cocaine abuse, uncomplicated: Secondary | ICD-10-CM | POA: Diagnosis present

## 2017-03-26 DIAGNOSIS — Z79899 Other long term (current) drug therapy: Secondary | ICD-10-CM

## 2017-03-26 DIAGNOSIS — D5700 Hb-SS disease with crisis, unspecified: Secondary | ICD-10-CM | POA: Diagnosis not present

## 2017-03-26 DIAGNOSIS — N179 Acute kidney failure, unspecified: Secondary | ICD-10-CM | POA: Insufficient documentation

## 2017-03-26 DIAGNOSIS — D72829 Elevated white blood cell count, unspecified: Secondary | ICD-10-CM | POA: Diagnosis present

## 2017-03-26 DIAGNOSIS — Z9842 Cataract extraction status, left eye: Secondary | ICD-10-CM

## 2017-03-26 DIAGNOSIS — Z961 Presence of intraocular lens: Secondary | ICD-10-CM | POA: Diagnosis present

## 2017-03-26 DIAGNOSIS — Z23 Encounter for immunization: Secondary | ICD-10-CM

## 2017-03-26 DIAGNOSIS — I10 Essential (primary) hypertension: Secondary | ICD-10-CM | POA: Diagnosis present

## 2017-03-26 DIAGNOSIS — N289 Disorder of kidney and ureter, unspecified: Secondary | ICD-10-CM

## 2017-03-26 DIAGNOSIS — N184 Chronic kidney disease, stage 4 (severe): Secondary | ICD-10-CM | POA: Diagnosis present

## 2017-03-26 DIAGNOSIS — D638 Anemia in other chronic diseases classified elsewhere: Secondary | ICD-10-CM | POA: Diagnosis present

## 2017-03-26 DIAGNOSIS — K219 Gastro-esophageal reflux disease without esophagitis: Secondary | ICD-10-CM | POA: Diagnosis present

## 2017-03-26 DIAGNOSIS — N185 Chronic kidney disease, stage 5: Secondary | ICD-10-CM | POA: Diagnosis present

## 2017-03-26 DIAGNOSIS — E1129 Type 2 diabetes mellitus with other diabetic kidney complication: Secondary | ICD-10-CM | POA: Diagnosis present

## 2017-03-26 DIAGNOSIS — E860 Dehydration: Secondary | ICD-10-CM | POA: Diagnosis present

## 2017-03-26 LAB — BASIC METABOLIC PANEL
Anion gap: 12 (ref 5–15)
BUN: 33 mg/dL — ABNORMAL HIGH (ref 6–20)
CALCIUM: 9.5 mg/dL (ref 8.9–10.3)
CO2: 19 mmol/L — ABNORMAL LOW (ref 22–32)
CREATININE: 3.42 mg/dL — AB (ref 0.61–1.24)
Chloride: 102 mmol/L (ref 101–111)
GFR, EST AFRICAN AMERICAN: 20 mL/min — AB (ref >60)
GFR, EST NON AFRICAN AMERICAN: 17 mL/min — AB (ref >60)
Glucose, Bld: 202 mg/dL — ABNORMAL HIGH (ref 65–99)
Potassium: 4.8 mmol/L (ref 3.5–5.1)
Sodium: 133 mmol/L — ABNORMAL LOW (ref 135–145)

## 2017-03-26 LAB — CBC
HCT: 20.3 % — ABNORMAL LOW (ref 39.0–52.0)
Hemoglobin: 6.6 g/dL — CL (ref 13.0–17.0)
MCH: 21.9 pg — ABNORMAL LOW (ref 26.0–34.0)
MCHC: 32.5 g/dL (ref 30.0–36.0)
MCV: 67.2 fL — ABNORMAL LOW (ref 78.0–100.0)
PLATELETS: 245 10*3/uL (ref 150–400)
RBC: 3.02 MIL/uL — AB (ref 4.22–5.81)
RDW: 17.3 % — ABNORMAL HIGH (ref 11.5–15.5)
WBC: 21.9 10*3/uL — AB (ref 4.0–10.5)

## 2017-03-26 LAB — CBG MONITORING, ED: GLUCOSE-CAPILLARY: 160 mg/dL — AB (ref 65–99)

## 2017-03-26 LAB — RETICULOCYTES
RBC.: 3.02 MIL/uL — AB (ref 4.22–5.81)
RETIC CT PCT: 6.7 % — AB (ref 0.4–3.1)
Retic Count, Absolute: 202.3 10*3/uL — ABNORMAL HIGH (ref 19.0–186.0)

## 2017-03-26 LAB — I-STAT TROPONIN, ED: TROPONIN I, POC: 0.01 ng/mL (ref 0.00–0.08)

## 2017-03-26 MED ORDER — ONDANSETRON HCL 4 MG/2ML IJ SOLN
4.0000 mg | INTRAMUSCULAR | Status: DC | PRN
  Administered 2017-03-26: 4 mg via INTRAVENOUS
  Filled 2017-03-26: qty 2

## 2017-03-26 MED ORDER — HYDROMORPHONE HCL 1 MG/ML IJ SOLN: 1.0000 mg | INTRAMUSCULAR | Status: AC

## 2017-03-26 MED ORDER — HYDROMORPHONE HCL 1 MG/ML IJ SOLN: 0.5000 mg | INTRAMUSCULAR | Status: AC

## 2017-03-26 MED ORDER — DIPHENHYDRAMINE HCL 50 MG/ML IJ SOLN
25.0000 mg | Freq: Once | INTRAMUSCULAR | Status: AC
  Administered 2017-03-26: 25 mg via INTRAVENOUS
  Filled 2017-03-26: qty 1

## 2017-03-26 MED ORDER — HYDROMORPHONE HCL 1 MG/ML IJ SOLN
1.0000 mg | INTRAMUSCULAR | Status: AC
Start: 2017-03-27 — End: 2017-03-26
  Administered 2017-03-26: 1 mg via INTRAVENOUS
  Filled 2017-03-26: qty 1

## 2017-03-26 MED ORDER — OXYCODONE-ACETAMINOPHEN 5-325 MG PO TABS
1.0000 | ORAL_TABLET | Freq: Once | ORAL | Status: AC
  Administered 2017-03-26: 1 via ORAL

## 2017-03-26 MED ORDER — SODIUM CHLORIDE 0.9 % IV SOLN
10.0000 mL/h | Freq: Once | INTRAVENOUS | Status: AC
  Administered 2017-03-26: 10 mL/h via INTRAVENOUS

## 2017-03-26 MED ORDER — SODIUM CHLORIDE 0.45 % IV SOLN
INTRAVENOUS | Status: DC
  Administered 2017-03-27 – 2017-03-28 (×2): via INTRAVENOUS

## 2017-03-26 MED ORDER — HYDROMORPHONE HCL 1 MG/ML IJ SOLN
1.0000 mg | INTRAMUSCULAR | Status: AC
  Administered 2017-03-27: 1 mg via INTRAVENOUS
  Filled 2017-03-26: qty 1

## 2017-03-26 MED ORDER — OXYCODONE-ACETAMINOPHEN 5-325 MG PO TABS
ORAL_TABLET | ORAL | Status: AC
  Filled 2017-03-26: qty 1

## 2017-03-26 MED ORDER — HYDROMORPHONE HCL 1 MG/ML IJ SOLN
1.0000 mg | INTRAMUSCULAR | Status: AC
  Administered 2017-03-27 (×2): 1 mg via INTRAVENOUS
  Filled 2017-03-26 (×2): qty 1

## 2017-03-26 MED ORDER — HYDROMORPHONE HCL 1 MG/ML IJ SOLN
0.5000 mg | INTRAMUSCULAR | Status: AC
  Administered 2017-03-27: 0.5 mg via INTRAVENOUS
  Filled 2017-03-26: qty 1

## 2017-03-26 NOTE — ED Provider Notes (Signed)
[facility] EMERGENCY DEPARTMENT Provider Note   CSN: [id number] Arrival date & time: 03/26/17  1820     History   Chief Complaint Chief Complaint  Patient presents with  . Chest Pain  . Sickle Cell Pain Crisis    HPI [name] is a 66 y.o. male.  The history is provided by the patient.  He has a history of sickle cell anemia, diabetes, hypertension, chronic kidney disease stage IV and comes in complaining of pain in his back and left side of his chest which started about 2 PM.  He has taken oxycodone at home without relief.  He currently rates pain at 8/10.  He has vomited several times at home.  There is a nonproductive cough.  He denies fever or chills.  He states that he is constipated and has been having difficulty urinating.  He denies shortness of breath.  Past Medical History:  Diagnosis Date  . Diabetes mellitus   . GERD (gastroesophageal reflux disease)   . Heart murmur   . Hypertension   . Pneumonia   . Renal insufficiency   . Sickle cell anemia (HCC)    Hgb SS disease     Patient Active Problem List   Diagnosis Date Noted  . Sickle cell pain crisis ([place]) 01/23/2017  . Sepsis ([place]) 12/27/2016  . Pain and swelling of left forearm 12/27/2016  . Abdominal distension   . Abdominal pain 11/04/2016  . Heart murmur 03/27/2016  . Chronic kidney disease (CKD), stage IV (severe) ([place]) 03/26/2016  . Cocaine abuse ([place]) 03/25/2016  . Hb-SS disease without crisis ([place])   . Hyperkalemia, diminished renal excretion 09/03/2014  . Chest pain 07/28/2011  . Leukocytosis 07/28/2011  . LBBB (left bundle branch block) 07/28/2011  . Hypertension   . Type 2 diabetes mellitus with renal complication [facility])     Past Surgical History:  Procedure Laterality Date  . AV FISTULA PLACEMENT Left 11/22/2016   Procedure: INSERTION OF ARTERIOVENOUS (AV) GORE-TEX GRAFT  LEFT FOREARM USING 6MM X 50CM GORETEX GRAFT;  Surgeon: Serafina Mitchell, MD;  Location: [facility];   Service: Vascular;  Laterality: Left;  . CATARACT EXTRACTION W/ INTRAOCULAR LENS  IMPLANT, BILATERAL    . CHOLECYSTECTOMY    . COLON SURGERY     Colon cancer surgery, removed small amt not full colectomy  . COLONOSCOPY     nclear who follows him for this  . LEG SURGERY     For ? gangrene after running into barbwire fence at 66yo       Home Medications    Prior to Admission medications   Medication Sig Start Date End Date Taking? Authorizing Provider  acetaminophen (TYLENOL) 500 MG tablet Take 500 mg by mouth every 6 (six) hours as needed for headache (pain).   Yes [provider]  amLODipine (NORVASC) 10 MG tablet Take 1 tablet (10 mg total) by mouth daily. 06/07/16  Yes Leana Gamer, MD  carvedilol (COREG) 12.5 MG tablet Take 12.5 mg by mouth 2 (two) times daily. 09/18/16  Yes [provider]  Docusate Calcium (STOOL SOFTENER PO) Take 1 capsule by mouth daily as needed (constipation).   Yes [provider]  folic acid (FOLVITE) 1 MG tablet Take 1 tablet (1 mg total) by mouth daily. 04/18/13  Yes Robbie Lis, MD  furosemide (LASIX) 80 MG tablet Take 1 tablet (80 mg total) by mouth daily. 12/30/16  Yes Mariel Aloe, MD  gabapentin (NEURONTIN) 100  MG capsule Take 1 capsule (100 mg total) by mouth 3 (three) times daily. Patient taking differently: Take 300 mg by mouth at bedtime.  12/29/16  Yes Mariel Aloe, MD  insulin aspart (NOVOLOG FLEXPEN) 100 UNIT/ML FlexPen Inject 2-6 Units into the skin 3 (three) times daily as needed for high blood sugar (CBG >150).   Yes [provider]  insulin glargine (LANTUS) 100 unit/mL SOPN Inject 20 Units into the skin at bedtime.    Yes [provider]  omeprazole (PRILOSEC) 20 MG capsule Take 20 mg by mouth daily.    Yes [provider]  oxyCODONE (ROXICODONE) 5 MG immediate release tablet Take 1 tablet (5 mg total) by mouth every 6 (six) hours as needed. Patient taking differently: Take 5  mg by mouth every 6 (six) hours as needed for moderate pain.  11/22/16  Yes Rhyne, Samantha J, PA-C  polyethylene glycol powder (MIRALAX) powder Take 17 g by mouth daily. Patient taking differently: Take 17 g by mouth daily as needed (constipation). Mix in 8 oz liquid and drink 10/28/16  Yes Palumbo, April, MD  saxagliptin HCl (ONGLYZA) 5 MG TABS tablet Take 0.5 tablets (2.5 mg total) by mouth daily. 12/29/16  Yes Mariel Aloe, MD  Sennosides (SENNA LAXATIVE PO) Take 1 tablet by mouth daily as needed (constipation).   Yes [provider]    Family History Family History  Problem Relation Age of Onset  . Venous thrombosis Mother        Dec. of blood clot   . Diabetes type II Mother   . AAA (abdominal aortic aneurysm) Mother   . Throat cancer Father        Deceased of throat cancer    Social History Social History  Substance Use Topics  . Smoking status: Former Smoker    Packs/day: 0.50    Years: 20.00    Quit date: 06/05/1966  . Smokeless tobacco: Never Used  . Alcohol use Yes     Comment: rare beer     Allergies   Morphine and related   Review of Systems Review of Systems  All other systems reviewed and are negative.    Physical Exam Updated Vital Signs BP (!) 166/92   Pulse 74   Temp 97.9 F (36.6 C) (Oral)   Resp 14   SpO2 97%   Physical Exam  Nursing note and vitals reviewed.  66 year old male, appears uncomfortable, but is in no acute distress. Vital signs are significant for hypertension. Oxygen saturation is 97%, which is normal. Head is normocephalic and atraumatic. PERRLA, EOMI. Oropharynx is clear. Neck is nontender and supple without adenopathy or JVD. Back is nontender in the midline.  There is moderate bilateral CVA tenderness. Lungs are clear without rales, wheezes, or rhonchi. Chest is moderately tender in the left anterior chest wall. Heart has regular rate and rhythm without murmur. Abdomen is soft, flat, nontender without masses or  hepatosplenomegaly and peristalsis is normoactive. Extremities have no cyanosis or edema, full range of motion is present.  AV shunt present and left forearm with bruit present but no definite thrill. Skin is warm and dry without rash. Neurologic: Mental status is normal, cranial nerves are intact, there are no motor or sensory deficits.  ED Treatments / Results  Labs (all labs ordered are listed, but only abnormal results are displayed) Labs Reviewed  BASIC METABOLIC PANEL - Abnormal; Notable for the following:       Result Value  Sodium 133 (*)    CO2 19 (*)    Glucose, Bld 202 (*)    BUN 33 (*)    Creatinine, Ser 3.42 (*)    GFR calc non Af Amer 17 (*)    GFR calc Af Amer 20 (*)    All other components within normal limits  CBC - Abnormal; Notable for the following:    WBC 21.9 (*)    RBC 3.02 (*)    Hemoglobin 6.6 (*)    HCT 20.3 (*)    MCV 67.2 (*)    MCH 21.9 (*)    RDW 17.3 (*)    All other components within normal limits  RETICULOCYTES - Abnormal; Notable for the following:    Retic Ct Pct 6.7 (*)    RBC. 3.02 (*)    Retic Count, Absolute 202.3 (*)    All other components within normal limits  CBG MONITORING, ED - Abnormal; Notable for the following:    Glucose-Capillary 160 (*)    All other components within normal limits  URINALYSIS, ROUTINE W REFLEX MICROSCOPIC  TROPONIN I  TROPONIN I  TROPONIN I  I-STAT TROPONIN, ED  I-STAT TROPONIN, ED  PREPARE RBC (CROSSMATCH)  TYPE AND SCREEN    EKG  EKG Interpretation  Date/Time:  Monday March 26 2017 18:19:27 EDT Ventricular Rate:  65 PR Interval:  190 QRS Duration: 160 QT Interval:  458 QTC Calculation: 476 R Axis:   85 Text Interpretation:  Normal sinus rhythm Left bundle branch block Abnormal ECG When compared with ECG of 01/23/2017, No significant change was found Confirmed by Delora Fuel ([id number]) on 03/26/2017 11:16:29 PM       Radiology Dg Chest 2 View  Result Date: 03/26/2017 CLINICAL DATA:   Chest pain shortness of breath, history of sickle cell EXAM: CHEST  2 VIEW COMPARISON:  01/23/2017, 08/10/2016 FINDINGS: Mild hyperinflation. Mild cardiomegaly with minimal central congestion. No focal pulmonary infiltrate or effusion. No pneumothorax. 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 IMPRESSION: Borderline to mild cardiomegaly with minimal central congestion. Electronically Signed   By: Donavan Foil M.D.   On: 03/26/2017 20:15    Procedures Procedures (including critical care time) CRITICAL CARE Performed by: YIFOY,DXAJO Total critical care time: 35 minutes Critical care time was exclusive of separately billable procedures and treating other patients. Critical care was necessary to treat or prevent imminent or life-threatening deterioration. Critical care was time spent personally by me on the following activities: development of treatment plan with patient and/or surrogate as well as nursing, discussions with consultants, evaluation of patient's response to treatment, examination of patient, obtaining history from patient or surrogate, ordering and performing treatments and interventions, ordering and review of laboratory studies, ordering and review of radiographic studies, pulse oximetry and re-evaluation of patient's condition.  Medications Ordered in ED Medications  0.9 %  sodium chloride infusion (not administered)  0.45 % sodium chloride infusion (not administered)  HYDROmorphone (DILAUDID) injection 0.5 mg (not administered)    Or  HYDROmorphone (DILAUDID) injection 0.5 mg (not administered)  HYDROmorphone (DILAUDID) injection 1 mg (not administered)    Or  HYDROmorphone (DILAUDID) injection 1 mg (not administered)  HYDROmorphone (DILAUDID) injection 1 mg (not administered)    Or  HYDROmorphone (DILAUDID) injection 1 mg (not administered)  HYDROmorphone (DILAUDID) injection 1 mg (not administered)    Or  HYDROmorphone (DILAUDID)  injection 1 mg (not administered)  diphenhydrAMINE (BENADRYL) injection 25 mg (not administered)  ondansetron (ZOFRAN)  injection 4 mg (not administered)  oxyCODONE-acetaminophen (PERCOCET/ROXICET) 5-325 MG per tablet 1 tablet (1 tablet Oral Given 03/26/17 2133)     Initial Impression / Assessment and Plan / ED Course  I have reviewed the triage vital signs and the nursing notes.  Pertinent labs & imaging results that were available during my care of the patient were reviewed by me and considered in my medical decision making (Todd chart for details).  Sickle cell painful crisis.  Old records are reviewed, and he has multiple ED visits and hospitalizations for sickle cell disease.  Laboratory workup ordered before I saw the patient shows significant anemia with hemoglobin 6.6 compared with 8.1 on August 23.  He generally needs transfusions when he gets hemoglobin down to this range.  He is started on sickle cell pain protocol and blood is ordered for transfusion.  Case is discussed with Dr. Stephan Minister of [facility], who agrees to admit the patient.  Final Clinical Impressions(s) / ED Diagnoses   Final diagnoses:  Sickle cell pain crisis ([place])  Microcytic anemia  Renal insufficiency    New Prescriptions New Prescriptions   No medications on file     Delora Fuel, MD 61/51/83 [id number]

## 2017-03-26 NOTE — ED Notes (Signed)
IV attempted x's 2 without success 

## 2017-03-26 NOTE — ED Notes (Signed)
Unable to get blood ?

## 2017-03-27 ENCOUNTER — Other Ambulatory Visit: Payer: Self-pay

## 2017-03-27 ENCOUNTER — Inpatient Hospital Stay (HOSPITAL_COMMUNITY): Payer: Medicare HMO

## 2017-03-27 DIAGNOSIS — D638 Anemia in other chronic diseases classified elsewhere: Secondary | ICD-10-CM | POA: Diagnosis present

## 2017-03-27 DIAGNOSIS — N189 Chronic kidney disease, unspecified: Secondary | ICD-10-CM | POA: Diagnosis not present

## 2017-03-27 DIAGNOSIS — E1122 Type 2 diabetes mellitus with diabetic chronic kidney disease: Secondary | ICD-10-CM | POA: Diagnosis present

## 2017-03-27 DIAGNOSIS — E860 Dehydration: Secondary | ICD-10-CM | POA: Diagnosis present

## 2017-03-27 DIAGNOSIS — E875 Hyperkalemia: Secondary | ICD-10-CM | POA: Diagnosis present

## 2017-03-27 DIAGNOSIS — D72829 Elevated white blood cell count, unspecified: Secondary | ICD-10-CM | POA: Diagnosis present

## 2017-03-27 DIAGNOSIS — I10 Essential (primary) hypertension: Secondary | ICD-10-CM | POA: Diagnosis not present

## 2017-03-27 DIAGNOSIS — Z9842 Cataract extraction status, left eye: Secondary | ICD-10-CM | POA: Diagnosis not present

## 2017-03-27 DIAGNOSIS — I129 Hypertensive chronic kidney disease with stage 1 through stage 4 chronic kidney disease, or unspecified chronic kidney disease: Secondary | ICD-10-CM | POA: Diagnosis present

## 2017-03-27 DIAGNOSIS — R338 Other retention of urine: Secondary | ICD-10-CM | POA: Diagnosis not present

## 2017-03-27 DIAGNOSIS — K5900 Constipation, unspecified: Secondary | ICD-10-CM | POA: Diagnosis present

## 2017-03-27 DIAGNOSIS — Z794 Long term (current) use of insulin: Secondary | ICD-10-CM | POA: Diagnosis not present

## 2017-03-27 DIAGNOSIS — Z79899 Other long term (current) drug therapy: Secondary | ICD-10-CM | POA: Diagnosis not present

## 2017-03-27 DIAGNOSIS — D509 Iron deficiency anemia, unspecified: Secondary | ICD-10-CM | POA: Diagnosis present

## 2017-03-27 DIAGNOSIS — Z23 Encounter for immunization: Secondary | ICD-10-CM | POA: Diagnosis present

## 2017-03-27 DIAGNOSIS — D5700 Hb-SS disease with crisis, unspecified: Secondary | ICD-10-CM | POA: Diagnosis present

## 2017-03-27 DIAGNOSIS — Z9841 Cataract extraction status, right eye: Secondary | ICD-10-CM | POA: Diagnosis not present

## 2017-03-27 DIAGNOSIS — K219 Gastro-esophageal reflux disease without esophagitis: Secondary | ICD-10-CM | POA: Diagnosis present

## 2017-03-27 DIAGNOSIS — E871 Hypo-osmolality and hyponatremia: Secondary | ICD-10-CM | POA: Diagnosis present

## 2017-03-27 DIAGNOSIS — I340 Nonrheumatic mitral (valve) insufficiency: Secondary | ICD-10-CM | POA: Diagnosis not present

## 2017-03-27 DIAGNOSIS — Z885 Allergy status to narcotic agent status: Secondary | ICD-10-CM | POA: Diagnosis not present

## 2017-03-27 DIAGNOSIS — N179 Acute kidney failure, unspecified: Secondary | ICD-10-CM | POA: Diagnosis present

## 2017-03-27 DIAGNOSIS — F1410 Cocaine abuse, uncomplicated: Secondary | ICD-10-CM | POA: Diagnosis present

## 2017-03-27 DIAGNOSIS — Z961 Presence of intraocular lens: Secondary | ICD-10-CM | POA: Diagnosis present

## 2017-03-27 DIAGNOSIS — E1121 Type 2 diabetes mellitus with diabetic nephropathy: Secondary | ICD-10-CM | POA: Diagnosis not present

## 2017-03-27 DIAGNOSIS — Z87891 Personal history of nicotine dependence: Secondary | ICD-10-CM | POA: Diagnosis not present

## 2017-03-27 DIAGNOSIS — E872 Acidosis: Secondary | ICD-10-CM | POA: Diagnosis present

## 2017-03-27 DIAGNOSIS — N139 Obstructive and reflux uropathy, unspecified: Secondary | ICD-10-CM | POA: Diagnosis present

## 2017-03-27 DIAGNOSIS — N289 Disorder of kidney and ureter, unspecified: Secondary | ICD-10-CM

## 2017-03-27 DIAGNOSIS — N184 Chronic kidney disease, stage 4 (severe): Secondary | ICD-10-CM | POA: Diagnosis present

## 2017-03-27 LAB — PREPARE RBC (CROSSMATCH)

## 2017-03-27 LAB — RAPID URINE DRUG SCREEN, HOSP PERFORMED
AMPHETAMINES: NOT DETECTED
Barbiturates: NOT DETECTED
Benzodiazepines: NOT DETECTED
Cocaine: NOT DETECTED
Opiates: POSITIVE — AB
TETRAHYDROCANNABINOL: NOT DETECTED

## 2017-03-27 LAB — GLUCOSE, CAPILLARY
GLUCOSE-CAPILLARY: 103 mg/dL — AB (ref 65–99)
Glucose-Capillary: 88 mg/dL (ref 65–99)
Glucose-Capillary: 100 mg/dL — ABNORMAL HIGH (ref 65–99)
Glucose-Capillary: 72 mg/dL (ref 65–99)

## 2017-03-27 LAB — URINALYSIS, ROUTINE W REFLEX MICROSCOPIC
Bilirubin Urine: NEGATIVE
Glucose, UA: NEGATIVE mg/dL
Hgb urine dipstick: NEGATIVE
Ketones, ur: NEGATIVE mg/dL
Leukocytes, UA: NEGATIVE
Nitrite: NEGATIVE
PH: 5 (ref 5.0–8.0)
Protein, ur: 30 mg/dL — AB
SPECIFIC GRAVITY, URINE: 1.016 (ref 1.005–1.030)

## 2017-03-27 LAB — PHOSPHORUS: Phosphorus: 4.6 mg/dL (ref 2.5–4.6)

## 2017-03-27 LAB — I-STAT TROPONIN, ED: TROPONIN I, POC: 0 ng/mL (ref 0.00–0.08)

## 2017-03-27 LAB — HEMOGLOBIN AND HEMATOCRIT, BLOOD
HCT: 20.7 % — ABNORMAL LOW (ref 39.0–52.0)
Hemoglobin: 6.9 g/dL — CL (ref 13.0–17.0)

## 2017-03-27 LAB — ECHOCARDIOGRAM COMPLETE
HEIGHTINCHES: 73 in
Weight: 2843.05 oz

## 2017-03-27 LAB — MAGNESIUM: Magnesium: 1.8 mg/dL (ref 1.7–2.4)

## 2017-03-27 LAB — TROPONIN I
Troponin I: <0.03 ng/mL (ref <0.03)
Troponin I: <0.03 ng/mL (ref <0.03)

## 2017-03-27 MED ORDER — SENNOSIDES-DOCUSATE SODIUM 8.6-50 MG PO TABS
1.0000 | ORAL_TABLET | Freq: Two times a day (BID) | ORAL | Status: DC
  Administered 2017-03-27 – 2017-04-02 (×12): 1 via ORAL
  Filled 2017-03-27 (×12): qty 1

## 2017-03-27 MED ORDER — BISACODYL 10 MG RE SUPP
10.0000 mg | Freq: Every day | RECTAL | Status: DC | PRN
Start: 2017-03-27 — End: 2017-04-02

## 2017-03-27 MED ORDER — FUROSEMIDE 40 MG PO TABS
80.0000 mg | ORAL_TABLET | Freq: Every day | ORAL | Status: DC
  Administered 2017-03-27 – 2017-03-30 (×4): 80 mg via ORAL
  Filled 2017-03-27 (×4): qty 2

## 2017-03-27 MED ORDER — MAGNESIUM CITRATE PO SOLN: 1.0000 | Freq: Once | ORAL | Status: DC | PRN

## 2017-03-27 MED ORDER — SODIUM CHLORIDE 0.9% FLUSH: 9.0000 mL | INTRAVENOUS | Status: DC | PRN

## 2017-03-27 MED ORDER — INSULIN ASPART 100 UNIT/ML ~~LOC~~ SOLN: 0.0000 [IU] | Freq: Every day | SUBCUTANEOUS | Status: DC

## 2017-03-27 MED ORDER — INSULIN GLARGINE 100 UNIT/ML ~~LOC~~ SOLN
10.0000 [IU] | Freq: Every day | SUBCUTANEOUS | Status: DC
  Administered 2017-03-27 – 2017-04-01 (×6): 10 [IU] via SUBCUTANEOUS
  Filled 2017-03-27 (×7): qty 0.1

## 2017-03-27 MED ORDER — POLYETHYLENE GLYCOL 3350 17 GM/SCOOP PO POWD: 17.0000 g | Freq: Every day | ORAL | Status: DC | PRN

## 2017-03-27 MED ORDER — INSULIN ASPART 100 UNIT/ML ~~LOC~~ SOLN: 0.0000 [IU] | Freq: Three times a day (TID) | SUBCUTANEOUS | Status: DC

## 2017-03-27 MED ORDER — HYDROMORPHONE 1 MG/ML IV SOLN
INTRAVENOUS | Status: DC
  Administered 2017-03-27 (×2): 4 mg via INTRAVENOUS
  Administered 2017-03-27: 2.39 mg via INTRAVENOUS
  Administered 2017-03-27: 2.4 mg via INTRAVENOUS
  Administered 2017-03-27: 06:00:00 via INTRAVENOUS
  Administered 2017-03-28: 2.6 mg via INTRAVENOUS
  Administered 2017-03-28: 2.86 mg via INTRAVENOUS
  Administered 2017-03-28: 3.79 mg via INTRAVENOUS
  Administered 2017-03-28: 0.8 mg via INTRAVENOUS
  Administered 2017-03-28: 2.4 mg via INTRAVENOUS
  Administered 2017-03-28: 1.6 mg via INTRAVENOUS
  Administered 2017-03-28: 13:00:00 via INTRAVENOUS
  Administered 2017-03-29: 0.8 mg via INTRAVENOUS
  Administered 2017-03-29: 0 mg via INTRAVENOUS
  Administered 2017-03-29: 0.8 mg via INTRAVENOUS
  Administered 2017-03-29: 0 mg via INTRAVENOUS
  Administered 2017-03-29: 0.8 mg via INTRAVENOUS
  Administered 2017-03-29: 2.4 mg via INTRAVENOUS
  Administered 2017-03-30: 0.8 mg via INTRAVENOUS
  Administered 2017-03-30: 1.6 mg via INTRAVENOUS
  Administered 2017-03-30: 0.8 mg via INTRAVENOUS
  Filled 2017-03-27 (×2): qty 25

## 2017-03-27 MED ORDER — POLYETHYLENE GLYCOL 3350 17 G PO PACK
17.0000 g | PACK | Freq: Every day | ORAL | Status: DC | PRN
  Administered 2017-03-28: 17 g via ORAL
  Filled 2017-03-27: qty 1

## 2017-03-27 MED ORDER — DIPHENHYDRAMINE HCL 25 MG PO CAPS: 25.0000 mg | ORAL_CAPSULE | ORAL | Status: DC | PRN

## 2017-03-27 MED ORDER — FOLIC ACID 1 MG PO TABS
1.0000 mg | ORAL_TABLET | Freq: Every day | ORAL | Status: DC
  Administered 2017-03-27 – 2017-04-02 (×7): 1 mg via ORAL
  Filled 2017-03-27 (×7): qty 1

## 2017-03-27 MED ORDER — ONDANSETRON HCL 4 MG/2ML IJ SOLN: 4.0000 mg | INTRAMUSCULAR | Status: DC | PRN

## 2017-03-27 MED ORDER — SODIUM CHLORIDE 0.9 % IV SOLN
25.0000 mg | INTRAVENOUS | Status: DC | PRN
  Filled 2017-03-27: qty 0.5

## 2017-03-27 MED ORDER — INSULIN GLARGINE 100 UNITS/ML SOLOSTAR PEN: 10.0000 [IU] | PEN_INJECTOR | Freq: Every day | SUBCUTANEOUS | Status: DC

## 2017-03-27 MED ORDER — DIPHENHYDRAMINE HCL 25 MG PO CAPS
25.0000 mg | ORAL_CAPSULE | Freq: Once | ORAL | Status: DC
  Filled 2017-03-27: qty 1

## 2017-03-27 MED ORDER — ACETAMINOPHEN 325 MG PO TABS
650.0000 mg | ORAL_TABLET | Freq: Once | ORAL | Status: DC
  Filled 2017-03-27: qty 2

## 2017-03-27 MED ORDER — FUROSEMIDE 10 MG/ML IJ SOLN
20.0000 mg | Freq: Once | INTRAMUSCULAR | Status: AC
  Administered 2017-03-27: 20 mg via INTRAVENOUS
  Filled 2017-03-27: qty 2

## 2017-03-27 MED ORDER — GABAPENTIN 300 MG PO CAPS
300.0000 mg | ORAL_CAPSULE | Freq: Every day | ORAL | Status: DC
  Administered 2017-03-27 – 2017-04-01 (×6): 300 mg via ORAL
  Filled 2017-03-27 (×6): qty 1

## 2017-03-27 MED ORDER — PANTOPRAZOLE SODIUM 40 MG PO TBEC
40.0000 mg | DELAYED_RELEASE_TABLET | Freq: Every day | ORAL | Status: DC
  Administered 2017-03-27 – 2017-04-02 (×7): 40 mg via ORAL
  Filled 2017-03-27 (×7): qty 1

## 2017-03-27 MED ORDER — ONDANSETRON HCL 4 MG PO TABS
4.0000 mg | ORAL_TABLET | ORAL | Status: DC | PRN
Start: 2017-03-27 — End: 2017-04-02

## 2017-03-27 MED ORDER — INSULIN ASPART 100 UNIT/ML ~~LOC~~ SOLN
0.0000 [IU] | Freq: Every day | SUBCUTANEOUS | Status: DC
  Administered 2017-04-02: 1 [IU] via SUBCUTANEOUS

## 2017-03-27 MED ORDER — ENOXAPARIN SODIUM 30 MG/0.3ML ~~LOC~~ SOLN
30.0000 mg | SUBCUTANEOUS | Status: DC
  Administered 2017-03-27 – 2017-04-02 (×7): 30 mg via SUBCUTANEOUS
  Filled 2017-03-27 (×7): qty 0.3

## 2017-03-27 MED ORDER — INSULIN ASPART 100 UNIT/ML ~~LOC~~ SOLN
0.0000 [IU] | Freq: Three times a day (TID) | SUBCUTANEOUS | Status: DC
  Administered 2017-03-28: 2 [IU] via SUBCUTANEOUS
  Administered 2017-03-29 – 2017-04-02 (×6): 1 [IU] via SUBCUTANEOUS

## 2017-03-27 MED ORDER — CARVEDILOL 12.5 MG PO TABS
12.5000 mg | ORAL_TABLET | Freq: Two times a day (BID) | ORAL | Status: DC
  Administered 2017-03-27 – 2017-04-02 (×13): 12.5 mg via ORAL
  Filled 2017-03-27 (×13): qty 1

## 2017-03-27 MED ORDER — SODIUM CHLORIDE 0.9 % IV SOLN
Freq: Once | INTRAVENOUS | Status: AC
  Administered 2017-03-27: 05:00:00 via INTRAVENOUS

## 2017-03-27 MED ORDER — NALOXONE HCL 0.4 MG/ML IJ SOLN: 0.4000 mg | INTRAMUSCULAR | Status: DC | PRN

## 2017-03-27 NOTE — ED Notes (Addendum)
[name] Wife [phone] Please call when pt is transferred

## 2017-03-27 NOTE — Progress Notes (Signed)
  Echocardiogram 2D Echocardiogram has been performed.  [name] T [name] 03/27/2017, 1:33 PM

## 2017-03-27 NOTE — Progress Notes (Signed)
  Echocardiogram 2D Echocardiogram has been performed.  Camauri Fleece T Isaiah Cianci 03/27/2017, 1:32 PM

## 2017-03-27 NOTE — Progress Notes (Signed)
This RN attempted to capture an EKG strip twice for this patient. First EKG machine had missing parts and the second EKG would not pick up the leads placed on patient. Troubleshooting was done, none effective. Pt. Made aware, will make day shift aware. Will continue to monitor.

## 2017-03-27 NOTE — ED Notes (Signed)
Patient being transferred to [facility] tele unit.  Patient gives permission verbally, due to narcotic therapy and signature pad not working.

## 2017-03-27 NOTE — Progress Notes (Signed)
Writer spoke to Dr Doreene Burke about orders placed for PRBC transfusion. Per Dr Doreene Burke pt is to received only 2 unit of PRBC.

## 2017-03-27 NOTE — H&P (Addendum)
[name] GUR:[id number] DOB: [birth year] DOA: 03/26/2017     PCP: Justin Ebbs, MD   Outpatient Specialists: Nephrology [name] Patient coming from:   home Lives   With family    Chief Complaint: Back and chest pain  HPI: [name] is a 66 y.o. male with medical history significant of type 2 diabetes, GERD, heart murmur, hypertension, pneumonia, chronic kidney disease stage IV, sickle cell anemia     Presented with back and left-sided chest pain worse with palpation. Otherwise no fevers or chills last admission was in August 2018 for sickle cell crisis. Patient is taking oxycodone at home but is up in the relief states he have had some nausea and vomiting nonproductive cough. abdominal distention with constipation with small BM today.  States today fell due to generalized weakness,  Regarding pertinent Chronic problems: CKD status post AV fistula placement in June 2016   IN ER:  Temp (24hrs), Avg:97.9 F (36.6 C), Min:97.9 F (36.6 C), Max:97.9 F (36.6 C)      on arrival  ED Triage Vitals  Enc Vitals Group     BP 03/26/17 1835 (!) 115/59     Pulse Rate 03/26/17 1835 64     Resp 03/26/17 1835 (!) 21     Temp 03/26/17 1835 97.9 F (36.6 C)     Temp Source 03/26/17 1835 Oral     SpO2 03/26/17 1835 100 %     Weight --      Height --      Head Circumference --      Peak Flow --      Pain Score 03/26/17 1856 10     Pain Loc --      Pain Edu? --      Excl. in [place]? --     Latest  RR 17 100% Hr 77 BP 136/100 Trop 0.01 Na 133 at baseline Cr 3.42 down from baseline of 3.42 WBC 21.9 baseline Hg 6.6  down from 8.1 PLT 245 retic 6.7 up from baseline CXR cardiomegaly with some congestion  Following Medications were ordered in ER: Medications  0.45 % sodium chloride infusion (not administered)  HYDROmorphone (DILAUDID) injection 0.5 mg (not administered)    Or  HYDROmorphone (DILAUDID) injection 0.5 mg (not administered)  HYDROmorphone (DILAUDID)  injection 1 mg (not administered)    Or  HYDROmorphone (DILAUDID) injection 1 mg (not administered)  HYDROmorphone (DILAUDID) injection 1 mg (not administered)    Or  HYDROmorphone (DILAUDID) injection 1 mg (not administered)  ondansetron (ZOFRAN) injection 4 mg (4 mg Intravenous Given 03/26/17 2356)  oxyCODONE-acetaminophen (PERCOCET/ROXICET) 5-325 MG per tablet 1 tablet (1 tablet Oral Given 03/26/17 2133)  0.9 %  sodium chloride infusion (10 mL/hr Intravenous New Bag/Given 03/26/17 2357)  HYDROmorphone (DILAUDID) injection 1 mg (1 mg Intravenous Given 03/26/17 2356)    Or  HYDROmorphone (DILAUDID) injection 1 mg ( Subcutaneous See Alternative 03/26/17 2356)  diphenhydrAMINE (BENADRYL) injection 25 mg (25 mg Intravenous Given 03/26/17 2355)     Hospitalist was called for admission for Sickle cell crisis, symptomatic anemia  Review of Systems:    Pertinent positives include:  chest pain  Constitutional:  No weight loss, night sweats, Fevers, chills, fatigue, weight loss  HEENT:  No headaches, Difficulty swallowing,Tooth/dental problems,Sore throat,  No sneezing, itching, ear ache, nasal congestion, post nasal drip,  Cardio-vascular:  No, Orthopnea, PND, anasarca, dizziness, palpitations.no Bilateral lower extremity swelling  GI:  No heartburn, indigestion, abdominal pain, nausea, vomiting, diarrhea, change  in bowel habits, loss of appetite, melena, blood in stool, hematemesis Resp:  no shortness of breath at rest. No dyspnea on exertion, No excess mucus, no productive cough, No non-productive cough, No coughing up of blood.No change in color of mucus.No wheezing. Skin:  no rash or lesions. No jaundice GU:  no dysuria, change in color of urine, no urgency or frequency. No straining to urinate.  No flank pain.  Musculoskeletal:  No joint pain or no joint swelling. No decreased range of motion. No back pain.  Psych:  No change in mood or affect. No depression or anxiety. No  memory loss.  Neuro: no localizing neurological complaints, no tingling, no weakness, no double vision, no gait abnormality, no slurred speech, no confusion  As per HPI otherwise 10 point review of systems negative.   Past Medical History: Past Medical History:  Diagnosis Date  . Diabetes mellitus   . GERD (gastroesophageal reflux disease)   . Heart murmur   . Hypertension   . Pneumonia   . Renal insufficiency   . Sickle cell anemia (HCC)    Hgb SS disease    Past Surgical History:  Procedure Laterality Date  . AV FISTULA PLACEMENT Left 11/22/2016   Procedure: INSERTION OF ARTERIOVENOUS (AV) GORE-TEX GRAFT  LEFT FOREARM USING 6MM X 50CM GORETEX GRAFT;  Surgeon: Serafina Mitchell, MD;  Location: [facility];  Service: Vascular;  Laterality: Left;  . CATARACT EXTRACTION W/ INTRAOCULAR LENS  IMPLANT, BILATERAL    . CHOLECYSTECTOMY    . COLON SURGERY     Colon cancer surgery, removed small amt not full colectomy  . COLONOSCOPY     nclear who follows him for this  . LEG SURGERY     For ? gangrene after running into barbwire fence at 66yo     Social History:  Ambulatory   cane, walker      reports that he quit smoking about 50 years ago. He has a 10.00 pack-year smoking history. He has never used smokeless tobacco. He reports that he drinks alcohol. He reports that he uses drugs, including Cocaine.  Allergies:   Allergies  Allergen Reactions  . Morphine And Related Itching       Family History:  Family History  Problem Relation Age of Onset  . Venous thrombosis Mother        Dec. of blood clot   . Diabetes type II Mother   . AAA (abdominal aortic aneurysm) Mother   . Throat cancer Father        Deceased of throat cancer    Medications: Prior to Admission medications   Medication Sig Start Date End Date Taking? Authorizing Provider  acetaminophen (TYLENOL) 500 MG tablet Take 500 mg by mouth every 6 (six) hours as needed for headache (pain).   Yes [provider]  amLODipine (NORVASC) 10 MG tablet Take 1 tablet (10 mg total) by mouth daily. 06/07/16  Yes Leana Gamer, MD  carvedilol (COREG) 12.5 MG tablet Take 12.5 mg by mouth 2 (two) times daily. 09/18/16  Yes [provider]  Docusate Calcium (STOOL SOFTENER PO) Take 1 capsule by mouth daily as needed (constipation).   Yes [provider]  folic acid (FOLVITE) 1 MG tablet Take 1 tablet (1 mg total) by mouth daily. 04/18/13  Yes Robbie Lis, MD  furosemide (LASIX) 80 MG tablet Take 1 tablet (80 mg total) by mouth daily. 12/30/16  Yes Mariel Aloe, MD  gabapentin (NEURONTIN) 100 MG  capsule Take 1 capsule (100 mg total) by mouth 3 (three) times daily. Patient taking differently: Take 300 mg by mouth at bedtime.  12/29/16  Yes Mariel Aloe, MD  insulin aspart (NOVOLOG FLEXPEN) 100 UNIT/ML FlexPen Inject 2-6 Units into the skin 3 (three) times daily as needed for high blood sugar (CBG >150).   Yes [provider]  insulin glargine (LANTUS) 100 unit/mL SOPN Inject 20 Units into the skin at bedtime.    Yes [provider]  omeprazole (PRILOSEC) 20 MG capsule Take 20 mg by mouth daily.    Yes [provider]  oxyCODONE (ROXICODONE) 5 MG immediate release tablet Take 1 tablet (5 mg total) by mouth every 6 (six) hours as needed. Patient taking differently: Take 5 mg by mouth every 6 (six) hours as needed for moderate pain.  11/22/16  Yes Rhyne, Samantha J, PA-C  polyethylene glycol powder (MIRALAX) powder Take 17 g by mouth daily. Patient taking differently: Take 17 g by mouth daily as needed (constipation). Mix in 8 oz liquid and drink 10/28/16  Yes Palumbo, April, MD  saxagliptin HCl (ONGLYZA) 5 MG TABS tablet Take 0.5 tablets (2.5 mg total) by mouth daily. 12/29/16  Yes Mariel Aloe, MD  Sennosides (SENNA LAXATIVE PO) Take 1 tablet by mouth daily as needed (constipation).   Yes [provider]    Physical Exam: Patient Vitals for the past 24  hrs:  BP Temp Temp src Pulse Resp SpO2  03/26/17 2216 (!) 166/92 - - 74 14 97 %  03/26/17 1835 (!) 115/59 97.9 F (36.6 C) Oral 64 (!) 21 100 %    1. General:  in No Acute distress   Chronically ill  -appearing 2. Psychological: Alert and   Oriented 3. Head/ENT:     Dry Mucous Membranes                          Head Non traumatic, neck supple                            Poor Dentition 4. SKIN: normal   Skin turgor,  Skin clean Dry and intact no rash 5. Heart: Regular rate and rhythm no Murmur, no Rub or gallop 6. Lungs:   no wheezes or crackles   7. Abdomen: Soft, non-tender, Non distended  obese  bowel sounds diminished 8. Lower extremities: no clubbing, cyanosis, or edema 9. Neurologically Grossly intact, moving all 4 extremities equally   10. MSK: Normal range of motion Left sided chest wall tenderness  body mass index is unknown because there is no height or weight on file.  Labs on Admission:   Labs on Admission: I have personally reviewed following labs and imaging studies  CBC:  Recent Labs Lab 03/26/17 1942  WBC 21.9*  HGB 6.6*  HCT 20.3*  MCV 67.2*  PLT 196   Basic Metabolic Panel:  Recent Labs Lab 03/26/17 1942  NA 133*  K 4.8  CL 102  CO2 19*  GLUCOSE 202*  BUN 33*  CREATININE 3.42*  CALCIUM 9.5   GFR: CrCl cannot be calculated (Unknown ideal weight.). Liver Function Tests: No results for input(s): AST, ALT, ALKPHOS, BILITOT, PROT, ALBUMIN in the last 168 hours. No results for input(s): LIPASE, AMYLASE in the last 168 hours. No results for input(s): AMMONIA in the last 168 hours. Coagulation Profile: No results for input(s): INR, PROTIME in the last  168 hours. Cardiac Enzymes: No results for input(s): CKTOTAL, CKMB, CKMBINDEX, TROPONINI in the last 168 hours. BNP (last 3 results) No results for input(s): PROBNP in the last 8760 hours. HbA1C: No results for input(s): HGBA1C in the last 72 hours. CBG:  Recent Labs Lab 03/26/17 1844    GLUCAP 160*   Lipid Profile: No results for input(s): CHOL, HDL, LDLCALC, TRIG, CHOLHDL, LDLDIRECT in the last 72 hours. Thyroid Function Tests: No results for input(s): TSH, T4TOTAL, FREET4, T3FREE, THYROIDAB in the last 72 hours. Anemia Panel:  Recent Labs  03/26/17 1942  RETICCTPCT 6.7*   Urine analysis: Sepsis Labs: @LABRCNTIP (procalcitonin:4,lacticidven:4) )No results found for this or any previous visit (from the past 240 hour(s)).    UA  ordered  Lab Results  Component Value Date   HGBA1C <4.2 (L) 03/25/2016    CrCl cannot be calculated (Unknown ideal weight.).  BNP (last 3 results) No results for input(s): PROBNP in the last 8760 hours.   ECG REPORT  Independently reviewed Rate:76  Rhythm: LBBB ST&T Change: na QTC 476  There were no vitals filed for this visit.   Cultures:    Component Value Date/Time   SDES Urine 12/28/2016 2200   SPECREQUEST NONE 12/28/2016 2200   CULT NO GROWTH 12/28/2016 2200   REPTSTATUS 12/30/2016 FINAL 12/28/2016 2200     Radiological Exams on Admission: Dg Chest 2 View  Result Date: 03/26/2017 CLINICAL DATA:  Chest pain shortness of breath, history of sickle cell EXAM: CHEST  2 VIEW COMPARISON:  01/23/2017, 08/10/2016 FINDINGS: Mild hyperinflation. Mild cardiomegaly with minimal central congestion. No focal pulmonary infiltrate or effusion. No pneumothorax. 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 IMPRESSION: Borderline to mild cardiomegaly with minimal central congestion. Electronically Signed   By: Donavan Foil M.D.   On: 03/26/2017 20:15    Chart has been reviewed    Assessment/Plan  66 y.o. male with medical history significant of type 2 diabetes, GERD, heart murmur, hypertension, pneumonia, chronic kidney disease stage IV, sickle cell anemia  Admitted for Sickle cell crisis, symptomatic anemia   Present on Admission: . Sickle cell pain crisis ([place]) - - will admit per  sickle cell protocol,    control pain,    hydrate with IVF D5 .45% Saline @ 100 mls/hour,    Weight based Dilaudid PCA for opioid tolerant patients.    continue hydroxyurea and folic acid   Transfuse as   Hg dropped significantly below baseline.    No evidence of acute chest at this time   Sickle cell team to take over management in AM  . Chronic kidney disease (CKD), stage IV (severe) ([place]) - currently stable, avoid nephrotoxic medication avoid using the left arm currently no indication for dialysis   . Hypertension - currently  stable continue Coreg . Leukocytosis stable chronic in a setting of sickle cell disease  . Type 2 diabetes mellitus with renal complication ([place]). - Order Sensitive SSI   - continue home insulin regimen    Lantus decrease to 10 units for tonight and monitor blood glucose,  -  check TSH and HgA1C  - Hold by mouth medications  Symptomatic anemia we'll transfuse   1 unit follow CBC  Falls  - have PT OT evaluate Chest discomfort will cycle cardiac enzymes monitor on telemetry given risk factors. Most likely musculoskeletal given reproducible by palpation  obstipation  - obtain KUB order bowel regimen, Evaluate for urinary retention Other plan as per  orders.  DVT prophylaxis:    Lovenox     Code Status:  FULL CODE  care as per patient   Family Communication:   Family  at  Bedside  plan of care was discussed with   Wife,  Disposition Plan:    To home once workup is complete and patient is stable     Consults called: none  Admission status:    inpatient       Level of care    tele           I have spent a total of 56 min on this admission    Quinetta Shilling 03/27/2017, 1:53 AM    [facility]  Pager [phone]   after 2 AM please page floor coverage PA If 7AM-7PM, please contact the day team taking care of the patient  [email or web address]  Password [email or web address]

## 2017-03-27 NOTE — Evaluation (Signed)
Physical Therapy Evaluation Patient Details Name: [name] MRN: [id number] DOB: 22-Jul-1950 Today's Date: 03/27/2017   History of Present Illness  66 y.o. male with medical history significant of type 2 diabetes, GERD, heart murmur, hypertension, pneumonia, chronic kidney disease stage IV, cocaine use, sickle cell anemia admitted with sicle cell crisis.   Clinical Impression  Pt admitted with above diagnosis. Pt currently with functional limitations due to the deficits listed below (see PT Problem List). Pt ambulated 180' with RW, SaO2 88% on RA, distance limited by fatigue. HHPT recommended.  Pt will benefit from skilled PT to increase their independence and safety with mobility to allow discharge to the venue listed below.       Follow Up Recommendations Home health PT    Equipment Recommendations  None recommended by PT    Recommendations for Other Services       Precautions / Restrictions Precautions Precautions: Fall Precaution Comments: slid to one knee just PTA, otherwise denies falls in past 1 year Restrictions Weight Bearing Restrictions: No      Mobility  Bed Mobility Overal bed mobility: Modified Independent             General bed mobility comments: with bedrail  Transfers Overall transfer level: Needs assistance Equipment used: Rolling walker (2 wheeled) Transfers: Sit to/from Stand Sit to Stand: Min guard         General transfer comment: VCs hand placement  Ambulation/Gait Ambulation/Gait assistance: Supervision Ambulation Distance (Feet): 180 Feet Assistive device: Rolling walker (2 wheeled) Gait Pattern/deviations: Step-through pattern;Decreased stride length   Gait velocity interpretation: at or above normal speed for age/gender General Gait Details: SaO2 88% on RA walking, 98% on 2L O2 at rest, 1/4 dyspnea, no LOB  Stairs            Wheelchair Mobility    Modified Rankin (Stroke Patients Only)       Balance Overall  balance assessment: Modified Independent                                           Pertinent Vitals/Pain Pain Assessment: 0-10 Pain Score: 8  Pain Location: back, chest Pain Descriptors / Indicators: Aching Pain Intervention(s): Limited activity within patient's tolerance;Monitored during session;Premedicated before session    Home Living Family/patient expects to be discharged to:: Private residence Living Arrangements: Alone Available Help at Discharge: Family;Friend(s);Available PRN/intermittently Type of Home: House Home Access: Stairs to enter Entrance Stairs-Rails: Chemical engineer of Steps: 5 Home Layout: One level Home Equipment: Walker - 2 wheels;Walker - 4 wheels;Cane - single point;Grab bars - tub/shower      Prior Function Level of Independence: Independent with assistive device(s)         Comments: walks with RW or SPC, daughter/son help with transportation as pt doesn't drive     Hand Dominance        Extremity/Trunk Assessment   Upper Extremity Assessment Upper Extremity Assessment: Overall WFL for tasks assessed    Lower Extremity Assessment Lower Extremity Assessment: Overall WFL for tasks assessed (pt reporting tingling B toes at baseline, sensation intact to light touch)    Cervical / Trunk Assessment Cervical / Trunk Assessment: Normal  Communication   Communication: No difficulties  Cognition Arousal/Alertness: Awake/alert Behavior During Therapy: WFL for tasks assessed/performed Overall Cognitive Status: No family/caregiver present to determine baseline cognitive functioning  General Comments: able to follow commands, oriented x 4, some inconsistencies with reporting prior functional level (stated he used 2 wheeled RW then stated he uses 4 wheeled RW)      General Comments      Exercises     Assessment/Plan    PT Assessment Patient needs continued PT  services  PT Problem List Decreased activity tolerance;Decreased mobility;Cardiopulmonary status limiting activity;Pain       PT Treatment Interventions Gait training;Functional mobility training;Stair training;Therapeutic exercise;Patient/family education;Therapeutic activities    PT Goals (Current goals can be found in the Care Plan section)  Acute Rehab PT Goals Patient Stated Goal: likes to go fishing but hasn't been able to recently PT Goal Formulation: With patient Time For Goal Achievement: 04/10/17 Potential to Achieve Goals: Good    Frequency Min 3X/week   Barriers to discharge        Co-evaluation               AM-PAC PT "6 Clicks" Daily Activity  Outcome Measure Difficulty turning over in bed (including adjusting bedclothes, sheets and blankets)?: A Little Difficulty moving from lying on back to sitting on the side of the bed? : A Little Difficulty sitting down on and standing up from a chair with arms (e.g., wheelchair, bedside commode, etc,.)?: A Little Help needed moving to and from a bed to chair (including a wheelchair)?: A Little Help needed walking in hospital room?: A Little Help needed climbing 3-5 steps with a railing? : A Little 6 Click Score: 18    End of Session Equipment Utilized During Treatment: Gait belt;Oxygen Activity Tolerance: Patient tolerated treatment well Patient left: in chair;with call bell/phone within reach;with chair alarm set;with nursing/sitter in room Nurse Communication: Mobility status PT Visit Diagnosis: History of falling (Z91.81);Difficulty in walking, not elsewhere classified (R26.2)    Time: 5035-4656 PT Time Calculation (min) (ACUTE ONLY): 28 min   Charges:   PT Evaluation $PT Eval Low Complexity: 1 Low PT Treatments $Gait Training: 8-22 mins   PT G Codes:        [phone]  Blondell Reveal Kistler 03/27/2017, 10:10 AM

## 2017-03-27 NOTE — Progress Notes (Addendum)
CRITICAL VALUE ALERT  Critical Value:  HGB 6.9  Date & Time Notied:  03/27/17  Provider Notified: Dr Doreene Burke  Orders Received/Actions taken:

## 2017-03-28 DIAGNOSIS — K5900 Constipation, unspecified: Secondary | ICD-10-CM

## 2017-03-28 DIAGNOSIS — D649 Anemia, unspecified: Secondary | ICD-10-CM

## 2017-03-28 DIAGNOSIS — F1410 Cocaine abuse, uncomplicated: Secondary | ICD-10-CM

## 2017-03-28 DIAGNOSIS — D509 Iron deficiency anemia, unspecified: Secondary | ICD-10-CM

## 2017-03-28 LAB — CBC WITH DIFFERENTIAL/PLATELET
BASOS PCT: 0 %
Basophils Absolute: 0 10*3/uL (ref 0.0–0.1)
Eosinophils Absolute: 0 10*3/uL (ref 0.0–0.7)
Eosinophils Relative: 0 %
HEMATOCRIT: 24 % — AB (ref 39.0–52.0)
Hemoglobin: 8.2 g/dL — ABNORMAL LOW (ref 13.0–17.0)
LYMPHS ABS: 4.3 10*3/uL — AB (ref 0.7–4.0)
Lymphocytes Relative: 13 %
MCH: 23.8 pg — AB (ref 26.0–34.0)
MCHC: 34.2 g/dL (ref 30.0–36.0)
MCV: 69.6 fL — AB (ref 78.0–100.0)
MONO ABS: 4.6 10*3/uL — AB (ref 0.1–1.0)
Monocytes Relative: 14 %
NEUTROS ABS: 23.9 10*3/uL — AB (ref 1.7–7.7)
NRBC: 8 /100{WBCs} — AB
Neutrophils Relative %: 73 %
PLATELETS: 203 10*3/uL (ref 150–400)
RBC: 3.45 MIL/uL — ABNORMAL LOW (ref 4.22–5.81)
RDW: 21.1 % — AB (ref 11.5–15.5)
WBC: 32.8 10*3/uL — ABNORMAL HIGH (ref 4.0–10.5)

## 2017-03-28 LAB — TYPE AND SCREEN
ABO/RH(D): AB POS
ABO/RH(D): AB POS
ANTIBODY SCREEN: NEGATIVE
ANTIBODY SCREEN: NEGATIVE
UNIT DIVISION: 0
UNIT DIVISION: 0
Unit division: 0

## 2017-03-28 LAB — BPAM RBC
BLOOD PRODUCT EXPIRATION DATE: 201811062359
Blood Product Expiration Date: 201811082359
Blood Product Expiration Date: 201810282359
ISSUE DATE / TIME: 201810230216
ISSUE DATE / TIME: 201810231613
UNIT TYPE AND RH: 6200
UNIT TYPE AND RH: 7300
Unit Type and Rh: 7300

## 2017-03-28 LAB — GLUCOSE, CAPILLARY
Glucose-Capillary: 79 mg/dL (ref 65–99)
Glucose-Capillary: 110 mg/dL — ABNORMAL HIGH (ref 65–99)
Glucose-Capillary: 175 mg/dL — ABNORMAL HIGH (ref 65–99)

## 2017-03-28 LAB — HEMOGLOBIN A1C: Mean Plasma Glucose: <74 mg/dL

## 2017-03-28 LAB — LACTIC ACID, PLASMA: Lactic Acid, Venous: 0.8 mmol/L (ref 0.5–1.9)

## 2017-03-28 MED ORDER — PIPERACILLIN-TAZOBACTAM IN DEX 2-0.25 GM/50ML IV SOLN
2.2500 g | Freq: Three times a day (TID) | INTRAVENOUS | Status: DC
  Administered 2017-03-28 – 2017-03-29 (×2): 2.25 g via INTRAVENOUS
  Filled 2017-03-28 (×3): qty 50

## 2017-03-28 MED ORDER — PIPERACILLIN-TAZOBACTAM 3.375 G IVPB 30 MIN
3.3750 g | Freq: Once | INTRAVENOUS | Status: AC
  Administered 2017-03-28: 3.375 g via INTRAVENOUS
  Filled 2017-03-28: qty 50

## 2017-03-28 MED ORDER — PIPERACILLIN-TAZOBACTAM 3.375 G IVPB 30 MIN: 3.3750 g | Freq: Three times a day (TID) | INTRAVENOUS | Status: DC

## 2017-03-28 MED ORDER — INFLUENZA VAC SPLIT HIGH-DOSE 0.5 ML IM SUSY
0.5000 mL | PREFILLED_SYRINGE | INTRAMUSCULAR | Status: AC
  Administered 2017-03-29: 0.5 mL via INTRAMUSCULAR
  Filled 2017-03-28 (×2): qty 0.5

## 2017-03-28 MED ORDER — PIPERACILLIN-TAZOBACTAM 3.375 G IVPB: 3.3750 g | Freq: Three times a day (TID) | INTRAVENOUS | Status: DC

## 2017-03-28 NOTE — Progress Notes (Signed)
Patient had not voided all shift. Complained of abdominal pain. Bladder scan showed 700cc. MD made aware. In and out cath performed with 600cc urine emptied.

## 2017-03-28 NOTE — Evaluation (Signed)
Occupational Therapy Evaluation Patient Details Name: [name] MRN: [id number] DOB: 1951/05/30 Today's Date: 03/28/2017    History of Present Illness 66 y.o. male with medical history significant of type 2 diabetes, GERD, heart murmur, hypertension, pneumonia, chronic kidney disease stage IV, cocaine use, sickle cell anemia admitted with sicle cell crisis.    Clinical Impression   Pt was admitted for the above. Pt has a h/o sickle cell and states that he sometimes has pain in bil shoulders.  Pt lives alone and does the best he can, working within his pain tolerance.  He was limited today by both pain in shoulders and back.  Also, pt c/o dizziness after standing for several minutes. Will follow in acute with supervision level goals. He currently needs up to max A    Follow Up Recommendations  Home health OT;Supervision/Assistance - 24 hour (pt may need SNF, depending on progress)    Equipment Recommendations   (? 3:1, to be further assessed)    Recommendations for Other Services       Precautions / Restrictions Precautions Precautions: Fall Precaution Comments: slid to one knee just PTA, otherwise denies falls in past 1 year Restrictions Weight Bearing Restrictions: No      Mobility Bed Mobility               General bed mobility comments: oob  Transfers   Equipment used: Rolling walker (2 wheeled)   Sit to Stand: Min assist         General transfer comment: from recliner; assist to rise and stabilize. cues for UE placement    Balance                                           ADL either performed or assessed with clinical judgement   ADL Overall ADL's : Needs assistance/impaired Eating/Feeding: Independent Eating/Feeding Details (indicate cue type and reason): able to drink soup with straw.  Had 02 with C02 monitor and this was in the way of eating with spoon Grooming: Set up;Sitting   Upper Body Bathing: Set up;Sitting   Lower  Body Bathing: Moderate assistance;Sit to/from stand   Upper Body Dressing : Set up;Sitting   Lower Body Dressing: Maximal assistance;Sit to/from stand                 General ADL Comments: only stood with OT this session.  Pt c/o dizziness after several minutes.  Sat back down. Could not locate BP cuff in room; lab with pt when I returned. Will monitor on next visit.  pt's UEs limited due to pain. Pt states pain is on and off and he does the best he can     Vision         Perception     Praxis      Pertinent Vitals/Pain Pain Score: 5  Pain Location: back and bil shoulders with movement Pain Descriptors / Indicators: Aching Pain interventions:  Worked within pain tolerance and encouraged use of PCA     Hand Dominance     Extremity/Trunk Assessment Upper Extremity Assessment Upper Extremity Assessment: RUE deficits/detail;LUE deficits/detail RUE Deficits / Details: limited ROM bil. Could lift to approximately 60 degrees without a lot of pain           Communication Communication Communication: No difficulties   Cognition Arousal/Alertness: Awake/alert Behavior During Therapy: WFL for tasks assessed/performed Overall  Cognitive Status: No family/caregiver present to determine baseline cognitive functioning                                     General Comments       Exercises     Shoulder Instructions      Home Living   Living Arrangements: Alone Available Help at Discharge: Family;Friend(s);Available PRN/intermittently               Bathroom Shower/Tub: Tub/shower unit   Bathroom Toilet: Standard     Home Equipment: Environmental consultant - 2 wheels;Walker - 4 wheels;Cane - single point;Grab bars - tub/shower          Prior Functioning/Environment Level of Independence: Independent with assistive device(s)        Comments: walks with RW or SPC, daughter/son help with transportation as pt doesn't drive        OT Problem List: Decreased  strength;Decreased activity tolerance;Impaired balance (sitting and/or standing);Decreased knowledge of use of DME or AE;Pain      OT Treatment/Interventions: Self-care/ADL training;DME and/or AE instruction;Patient/family education;Balance training;Therapeutic activities    OT Goals(Current goals can be found in the care plan section) Acute Rehab OT Goals Patient Stated Goal: likes to go fishing but hasn't been able to recently OT Goal Formulation: With patient Time For Goal Achievement: 04/04/17 Potential to Achieve Goals: Good ADL Goals Pt Will Perform Lower Body Bathing: with supervision;with adaptive equipment;sit to/from stand Pt Will Perform Lower Body Dressing: with supervision;with adaptive equipment;sit to/from stand Pt Will Transfer to Toilet: with supervision;ambulating;regular height toilet;bedside commode (vs) Pt Will Perform Toileting - Clothing Manipulation and hygiene: with supervision;sit to/from stand  OT Frequency: Min 2X/week   Barriers to D/C:            Co-evaluation              AM-PAC PT "6 Clicks" Daily Activity     Outcome Measure Help from another person eating meals?: None Help from another person taking care of personal grooming?: A Little Help from another person toileting, which includes using toliet, bedpan, or urinal?: A Lot Help from another person bathing (including washing, rinsing, drying)?: A Lot Help from another person to put on and taking off regular upper body clothing?: A Little Help from another person to put on and taking off regular lower body clothing?: A Lot 6 Click Score: 16   End of Session    Activity Tolerance: Patient limited by fatigue Patient left: in chair;with call bell/phone within reach  OT Visit Diagnosis: Muscle weakness (generalized) (M62.81);Pain Pain - Right/Left:  (bil ues) Pain - part of body: Arm                Time: 9485-4627 OT Time Calculation (min): 23 min Charges:  OT General Charges $OT Visit:  1 Visit OT Evaluation $OT Eval Low Complexity: 1 Low G-Codes:     [place], OTR/L [phone] 03/28/2017  [name] 03/28/2017, 2:59 PM

## 2017-03-28 NOTE — Progress Notes (Signed)
Pharmacy Antibiotic Note  [name] is a 66 y.o. male admitted on 03/26/2017 with sepsis.  Pharmacy has been consulted for zosyn dosing. Pt with hx CKD stage 4, Creat 3.42 on admit but usually around 4.2-5.2 per EPIC, with  AV fistula placement in June 2016.  WBC elevated at 32.8, lactate WNL, wt 80 kg.   Plan:  zosyn 3.375 gm IV x 1 for load then zosyn 2.25 gm IV q8h   Height: 6\' 1"  (185.4 cm) Weight: 177 lb 11.1 oz (80.6 kg) IBW/kg (Calculated) : 79.9  Temp (24hrs), Avg:98.9 F (37.2 C), Min:98.3 F (36.8 C), Max:100.5 F (38.1 C)   Recent Labs Lab 03/26/17 1942 03/28/17 0920  WBC 21.9* 32.8*  CREATININE 3.42*  --     Estimated Creatinine Clearance: 23.4 mL/min (A) (by C-G formula based on SCr of 3.42 mg/dL (H)).    Allergies  Allergen Reactions  . Morphine And Related Itching    Thank you for allowing pharmacy to be a part of this patient's care.  Eudelia Bunch, Pharm.D. [phone] 03/28/2017 3:40 PM

## 2017-03-28 NOTE — Progress Notes (Signed)
Patient ID: [name], male   DOB: [birth year], 66 y.o.   MRN: [id number] Subjective:  Patient was reported to having low grade fever and a temp of 100.4, patient had associated chills, pain persists, denies chest pain or SOB. He has urinary retention, bladder scan showed 700 cc of urine. No dysuria. Hb is down to 6.6 today from baseline of >8.  Objective:   Vital signs in last 24 hours:  Vitals:   03/28/17 0800 03/28/17 0810 03/28/17 0907 03/28/17 1200  BP:   (!) 142/70   Pulse:   87   Resp: 19 17 17 19   Temp:      TempSrc:      SpO2: 95%  93% 93%  Weight:      Height:        Intake/Output from previous day:   Intake/Output Summary (Last 24 hours) at 03/28/17 1420 Last data filed at 03/28/17 1309  Gross per 24 hour  Intake           908.75 ml  Output              900 ml  Net             8.75 ml    Physical Exam: General: Alert, awake, oriented x3, in no acute distress.  HEENT: [place]/AT PEERL, EOMI Neck: Trachea midline,  no masses, no thyromegal,y no JVD, no carotid bruit OROPHARYNX:  Moist, No exudate/ erythema/lesions.  Heart: Regular rate and rhythm, without murmurs, rubs, gallops, PMI non-displaced, no heaves or thrills on palpation.  Lungs: Clear to auscultation, no wheezing or rhonchi noted. No increased vocal fremitus resonant to percussion  Abdomen: Soft, nontender, nondistended, positive bowel sounds, no masses no hepatosplenomegaly noted..  Neuro: No focal neurological deficits noted cranial nerves II through XII grossly intact. DTRs 2+ bilaterally upper and lower extremities. Strength 5 out of 5 in bilateral upper and lower extremities. Musculoskeletal: No warm swelling or erythema around joints, no spinal tenderness noted. Psychiatric: Patient alert and oriented x3, good insight and cognition, good recent to remote recall. Lymph node survey: No cervical axillary or inguinal lymphadenopathy noted.  Lab Results:  Basic Metabolic Panel:    Component Value  Date/Time   NA 133 (L) 03/26/2017 1942   K 4.8 03/26/2017 1942   CL 102 03/26/2017 1942   CO2 19 (L) 03/26/2017 1942   BUN 33 (H) 03/26/2017 1942   CREATININE 3.42 (H) 03/26/2017 1942   GLUCOSE 202 (H) 03/26/2017 1942   CALCIUM 9.5 03/26/2017 1942   CBC:    Component Value Date/Time   WBC 32.8 (H) 03/28/2017 0920   HGB 8.2 (L) 03/28/2017 0920   HCT 24.0 (L) 03/28/2017 0920   HCT 14.3 (LL) 11/07/2016 1518   PLT 203 03/28/2017 0920   MCV 69.6 (L) 03/28/2017 0920   NEUTROABS 23.9 (H) 03/28/2017 0920   LYMPHSABS 4.3 (H) 03/28/2017 0920   MONOABS 4.6 (H) 03/28/2017 0920   EOSABS 0.0 03/28/2017 0920   BASOSABS 0.0 03/28/2017 0920    No results found for this or any previous visit (from the past 240 hour(s)).  Studies/Results: Dg Chest 2 View  Result Date: 03/26/2017 CLINICAL DATA:  Chest pain shortness of breath, history of sickle cell EXAM: CHEST  2 VIEW COMPARISON:  01/23/2017, 08/10/2016 FINDINGS: Mild hyperinflation. Mild cardiomegaly with minimal central congestion. No focal pulmonary infiltrate or effusion. No pneumothorax. 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 IMPRESSION: Borderline to mild cardiomegaly  with minimal central congestion. Electronically Signed   By: Donavan Foil M.D.   On: 03/26/2017 20:15   Dg Abd 1 View  Result Date: 03/27/2017 CLINICAL DATA:  66 year old male with history of sickle cell anemia and renal insufficiency presenting with obstipation. EXAM: ABDOMEN - 1 VIEW COMPARISON:  Abdominal radiograph dated 11/08/2016 and CT dated 11/04/2016 FINDINGS: There is no bowel dilatation or evidence of obstruction. Normal caliber air-filled loops of small bowel noted in the mid abdomen. Right upper quadrant cholecystectomy clips and calcified infarct set splenic tissue in the left upper abdomen. There is diffuse heterogeneity of the bones with areas of sclerosis secondary to known sickle cell disease. No acute osseous  pathology. IMPRESSION: No evidence of bowel obstruction. Diffuse heterogeneity and sclerotic appearance of the bones secondary to sickle cell disease. Calcified infarcted splenic tissue. Electronically Signed   By: Anner Crete M.D.   On: 03/27/2017 01:22    Medications: Scheduled Meds: . acetaminophen  650 mg Oral Once  . carvedilol  12.5 mg Oral BID  . diphenhydrAMINE  25 mg Oral Once  . enoxaparin (LOVENOX) injection  30 mg Subcutaneous Q24H  . folic acid  1 mg Oral Daily  . furosemide  80 mg Oral Daily  . gabapentin  300 mg Oral QHS  . HYDROmorphone   Intravenous Q4H  . [START ON 03/29/2017] Influenza vac split quadrivalent PF  0.5 mL Intramuscular Tomorrow-1000  . insulin aspart  0-5 Units Subcutaneous QHS  . insulin aspart  0-9 Units Subcutaneous TID WC  . insulin glargine  10 Units Subcutaneous QHS  . pantoprazole  40 mg Oral Daily  . senna-docusate  1 tablet Oral BID   Continuous Infusions: . sodium chloride 75 mL/hr at 03/28/17 0523  . diphenhydrAMINE (BENADRYL) IVPB(SICKLE CELL ONLY)    . piperacillin-tazobactam     PRN Meds:.bisacodyl, diphenhydrAMINE **OR** diphenhydrAMINE (BENADRYL) IVPB(SICKLE CELL ONLY), magnesium citrate, naloxone **AND** sodium chloride flush, ondansetron, ondansetron **OR** ondansetron (ZOFRAN) IV, polyethylene glycol   Assessment/Plan: Active Problems:   Hypertension   Type 2 diabetes mellitus with renal complication (HCC)   Leukocytosis   Cocaine abuse (HCC)   Chronic kidney disease (CKD), stage IV (severe) (HCC)   Sickle cell pain crisis ([place])   Sickle cell crisis ([place])  1. Acute on Chronic CKD IV: Pt appears to be volume depleted. IVF after blood transfusion  2. Acute Urinary Retention: Nurse reports >400 of urine in bladder scan. Will place foley catheter in and out, if not able to void, will place indwelling catheter  3. Hb SS with Crisis: Will continue Dilaudid via PCA. Toradol contraindicated due to AKI. 4. DM II: BS currently  well controlled. Continue Lantus and SSI.  5. HTN: BP elevated during episodes of pain but otherwise well controlled.  6. Anemia of Chronic Disease: Hb is down to 6.6 today. Will transfuse patient with 2 units of PRBC to keep Hb around 8 baseline.   7. Leukocytosis: With fever Leukocytosis of >30 today, will pan-culture patient and start empiric antibiotics with Zosyn. If cultures are negative, will stop abx.  Code Status: Full Code Family Communication: N/A Disposition Plan: Not yet ready for discharge  Decie Verne  If 7PM-7AM, please contact night-coverage.  03/28/2017, 2:20 PM  LOS: 1 day

## 2017-03-29 ENCOUNTER — Other Ambulatory Visit: Payer: Self-pay

## 2017-03-29 DIAGNOSIS — I10 Essential (primary) hypertension: Secondary | ICD-10-CM

## 2017-03-29 DIAGNOSIS — N139 Obstructive and reflux uropathy, unspecified: Secondary | ICD-10-CM | POA: Clinically undetermined

## 2017-03-29 DIAGNOSIS — R338 Other retention of urine: Secondary | ICD-10-CM

## 2017-03-29 DIAGNOSIS — N184 Chronic kidney disease, stage 4 (severe): Secondary | ICD-10-CM

## 2017-03-29 DIAGNOSIS — D5700 Hb-SS disease with crisis, unspecified: Principal | ICD-10-CM

## 2017-03-29 DIAGNOSIS — D509 Iron deficiency anemia, unspecified: Secondary | ICD-10-CM

## 2017-03-29 DIAGNOSIS — N179 Acute kidney failure, unspecified: Secondary | ICD-10-CM

## 2017-03-29 DIAGNOSIS — D72829 Elevated white blood cell count, unspecified: Secondary | ICD-10-CM

## 2017-03-29 LAB — BASIC METABOLIC PANEL
Anion gap: 13 (ref 5–15)
BUN: 78 mg/dL — AB (ref 6–20)
CHLORIDE: 98 mmol/L — AB (ref 101–111)
CO2: 20 mmol/L — AB (ref 22–32)
CREATININE: 5.13 mg/dL — AB (ref 0.61–1.24)
Calcium: 8.8 mg/dL — ABNORMAL LOW (ref 8.9–10.3)
GFR calc Af Amer: 12 mL/min — ABNORMAL LOW (ref >60)
GFR calc non Af Amer: 10 mL/min — ABNORMAL LOW (ref >60)
Glucose, Bld: 108 mg/dL — ABNORMAL HIGH (ref 65–99)
Potassium: 5.2 mmol/L — ABNORMAL HIGH (ref 3.5–5.1)
Sodium: 131 mmol/L — ABNORMAL LOW (ref 135–145)

## 2017-03-29 LAB — COMPREHENSIVE METABOLIC PANEL
ALBUMIN: 3.4 g/dL — AB (ref 3.5–5.0)
ALT: 18 U/L (ref 17–63)
ANION GAP: 12 (ref 5–15)
AST: 36 U/L (ref 15–41)
Alkaline Phosphatase: 131 U/L — ABNORMAL HIGH (ref 38–126)
BUN: 74 mg/dL — ABNORMAL HIGH (ref 6–20)
CHLORIDE: 100 mmol/L — AB (ref 101–111)
CO2: 19 mmol/L — AB (ref 22–32)
Calcium: 9.1 mg/dL (ref 8.9–10.3)
Creatinine, Ser: 5.01 mg/dL — ABNORMAL HIGH (ref 0.61–1.24)
GFR calc non Af Amer: 11 mL/min — ABNORMAL LOW (ref >60)
GFR, EST AFRICAN AMERICAN: 12 mL/min — AB (ref >60)
GLUCOSE: 85 mg/dL (ref 65–99)
POTASSIUM: 5.4 mmol/L — AB (ref 3.5–5.1)
SODIUM: 131 mmol/L — AB (ref 135–145)
Total Bilirubin: 3.2 mg/dL — ABNORMAL HIGH (ref 0.3–1.2)
Total Protein: 7.5 g/dL (ref 6.5–8.1)

## 2017-03-29 LAB — CBC WITH DIFFERENTIAL/PLATELET
BASOS PCT: 0 %
Basophils Absolute: 0.1 10*3/uL (ref 0.0–0.1)
Eosinophils Absolute: 0.1 10*3/uL (ref 0.0–0.7)
Eosinophils Relative: 0 %
HEMATOCRIT: 25.4 % — AB (ref 39.0–52.0)
HEMOGLOBIN: 9 g/dL — AB (ref 13.0–17.0)
LYMPHS ABS: 2.5 10*3/uL (ref 0.7–4.0)
LYMPHS PCT: 10 %
MCH: 24.4 pg — AB (ref 26.0–34.0)
MCHC: 35.4 g/dL (ref 30.0–36.0)
MCV: 68.8 fL — ABNORMAL LOW (ref 78.0–100.0)
MONO ABS: 3.3 10*3/uL — AB (ref 0.1–1.0)
MONOS PCT: 13 %
NEUTROS PCT: 77 %
Neutro Abs: 20.3 10*3/uL — ABNORMAL HIGH (ref 1.7–7.7)
Platelets: 198 10*3/uL (ref 150–400)
RBC: 3.69 MIL/uL — ABNORMAL LOW (ref 4.22–5.81)
RDW: 22.2 % — ABNORMAL HIGH (ref 11.5–15.5)
WBC: 26.3 10*3/uL — ABNORMAL HIGH (ref 4.0–10.5)

## 2017-03-29 LAB — RETICULOCYTES
RBC.: 3.69 MIL/uL — ABNORMAL LOW (ref 4.22–5.81)
RETIC CT PCT: 5.1 % — AB (ref 0.4–3.1)
Retic Count, Absolute: 188.2 10*3/uL — ABNORMAL HIGH (ref 19.0–186.0)

## 2017-03-29 LAB — GLUCOSE, CAPILLARY
GLUCOSE-CAPILLARY: 123 mg/dL — AB (ref 65–99)
GLUCOSE-CAPILLARY: 83 mg/dL (ref 65–99)
GLUCOSE-CAPILLARY: 81 mg/dL (ref 65–99)
Glucose-Capillary: 117 mg/dL — ABNORMAL HIGH (ref 65–99)
Glucose-Capillary: 123 mg/dL — ABNORMAL HIGH (ref 65–99)

## 2017-03-29 LAB — TROPONIN I

## 2017-03-29 MED ORDER — TAMSULOSIN HCL 0.4 MG PO CAPS
0.4000 mg | ORAL_CAPSULE | Freq: Every day | ORAL | Status: DC
  Administered 2017-03-29 – 2017-04-02 (×5): 0.4 mg via ORAL
  Filled 2017-03-29 (×5): qty 1

## 2017-03-29 MED ORDER — SODIUM CHLORIDE 0.9 % IV SOLN
INTRAVENOUS | Status: DC
  Administered 2017-03-29 – 2017-03-30 (×4): via INTRAVENOUS

## 2017-03-29 MED ORDER — OXYCODONE HCL 5 MG PO TABS
10.0000 mg | ORAL_TABLET | Freq: Four times a day (QID) | ORAL | Status: DC
  Administered 2017-03-29 – 2017-03-30 (×4): 10 mg via ORAL
  Filled 2017-03-29 (×5): qty 2

## 2017-03-29 NOTE — Progress Notes (Signed)
Patient ID: [name], male   DOB: Feb 20, 1951, 66 y.o.   MRN: [id number] Spoke with Dr. Alyson Ingles from Urology and he advises to leave to Foley in place and continue Flomax at 0.4 mg daily and follow up as an out-patient.   Sarahmarie Leavey A.

## 2017-03-29 NOTE — Progress Notes (Signed)
Foley inserted with second Therapist, sports.Pt tolerated well. Urine return 841ml.

## 2017-03-29 NOTE — Progress Notes (Signed)
Bladder scanned 426ml urine. Denies abd pain. Paged MD

## 2017-03-29 NOTE — Progress Notes (Signed)
SICKLE CELL SERVICE PROGRESS NOTE  [name] WUJ:[id number] DOB: July 04, 1950 DOA: 03/26/2017 PCP: Nolene Ebbs, MD  Assessment/Plan: Active Problems:   Hypertension   Type 2 diabetes mellitus with renal complication (HCC)   Leukocytosis   Cocaine abuse (HCC)   Chronic kidney disease (CKD), stage IV (severe) (HCC)   Sickle cell pain crisis (HCC)   Sickle cell crisis (HCC)   Microcytic anemia   Obstipation   Renal insufficiency  1. Acute on Chronic CKD IV: Pt appears to be volume depleted. He also has urinary retention which is likely contributing to acute component. Will recheck renal function this evening.  2. Left sided Chest pain: Pain seems to be musculoskeletal and associated with sickel cell crisis. Will obtain serial Troponin values. EKG reviewed and unchanged from previous EKG. 3. Acute Urinary Retention: Nurse reports a post-void residual of >400. Will place foley catheter, and obtain imaging of prostate to evaluate for BPH.  4. Hb SS with Crisis: Will continue PCA and schedule Oxycodone 10 mg q 6 hours in light of decreased renal function. Toradol contraindicated due to AKI. 5. DM II:BS currently well controlled. Continue Lantus and SSI.  6. HTN: BP elevated during episodes of pain but otherwise well controlled.  7. Anemia of Chronic Disease: Pt s/p transfusion pf 2 units RBC's. Hb currently 9 g/dL. Which is much higher than his baseline.  8. Metabolic Acidosis: Feels that this is secondary to volume depleted state.  9. Leukocytosis: No evidence of infection. Will discontinue Zosyn and re-check tomorrow after patient has had some volume repletion.   Code Status: Full Code Family Communication: N/A Disposition Plan: Not yet ready for discharge  [place].  Pager [phone]. If 7PM-7AM, please contact night-coverage.  03/29/2017, 2:05 PM  LOS: 2 days   Interim History: Pt states that his pain is 5/10 and localized to his left chest wall. He has used only 7.66  mg of Dilaudid via the PCA with 18/10:demands/deliveries in the last 24 hours. He urinated 400 ml this morning but has had poor urine output.   Consultants:  None  Procedures:  None  Antibiotics:  Zosyn 10/24 >>10/25   Objective: Vitals:   03/29/17 1000 03/29/17 1038 03/29/17 1041 03/29/17 1232  BP:  131/74    Pulse:  78    Resp:    14  Temp:  99.4 F (37.4 C)    TempSrc:      SpO2: 95% 90% 93% 94%  Weight:      Height:       Weight change:   Intake/Output Summary (Last 24 hours) at 03/29/17 1405 Last data filed at 03/29/17 1000  Gross per 24 hour  Intake          1304.17 ml  Output             1000 ml  Net           304.17 ml     Physical Exam General: Alert, awake, oriented x3, in mild distress due to pain.  HEENT: [place]/AT PEERL, EOMI, anicteric Neck: Trachea midline,  no masses, no thyromegal,y no JVD, no carotid bruit OROPHARYNX:  Moist, No exudate/ erythema/lesions.  Heart: Regular rate and rhythm, without murmurs, rubs, gallops, PMI non-displaced, no heaves or thrills on palpation.  Lungs: Clear to auscultation, no wheezing or rhonchi noted. No increased vocal fremitus resonant to percussion  Abdomen: Soft, nontender, nondistended, positive bowel sounds, no masses no hepatosplenomegaly noted.  Neuro: No focal neurological deficits noted cranial nerves  II through XII grossly intact. Strength at baseline in bilateral upper and lower extremities. Musculoskeletal: No warmth swelling or erythema around joints, no spinal tenderness noted. Psychiatric: Patient alert and oriented x3, good insight and cognition, good recent to remote recall.     Data Reviewed: Basic Metabolic Panel:  Recent Labs Lab 03/26/17 1942 03/27/17 0829 03/29/17 0430  NA 133*  --  131*  K 4.8  --  5.4*  CL 102  --  100*  CO2 19*  --  19*  GLUCOSE 202*  --  85  BUN 33*  --  74*  CREATININE 3.42*  --  5.01*  CALCIUM 9.5  --  9.1  MG  --  1.8  --   PHOS  --  4.6  --    Liver  Function Tests:  Recent Labs Lab 03/29/17 0430  AST 36  ALT 18  ALKPHOS 131*  BILITOT 3.2*  PROT 7.5  ALBUMIN 3.4*   No results for input(s): LIPASE, AMYLASE in the last 168 hours. No results for input(s): AMMONIA in the last 168 hours. CBC:  Recent Labs Lab 03/26/17 1942 03/27/17 0829 03/28/17 0920 03/29/17 0430  WBC 21.9*  --  32.8* 26.3*  NEUTROABS  --   --  23.9* 20.3*  HGB 6.6* 6.9* 8.2* 9.0*  HCT 20.3* 20.7* 24.0* 25.4*  MCV 67.2*  --  69.6* 68.8*  PLT 245  --  203 198   Cardiac Enzymes:  Recent Labs Lab 03/27/17 0013 03/27/17 0829 03/29/17 1251  TROPONINI <0.03 <0.03 <0.03   BNP (last 3 results)  Recent Labs  01/23/17 1947  BNP 569.9*    ProBNP (last 3 results) No results for input(s): PROBNP in the last 8760 hours.  CBG:  Recent Labs Lab 03/28/17 1153 03/28/17 1732 03/28/17 2143 03/29/17 0734 03/29/17 1151  GLUCAP 110* 175* 123* 83 117*    No results found for this or any previous visit (from the past 240 hour(s)).   Studies: Dg Chest 2 View  Result Date: 03/26/2017 CLINICAL DATA:  Chest pain shortness of breath, history of sickle cell EXAM: CHEST  2 VIEW COMPARISON:  01/23/2017, 08/10/2016 FINDINGS: Mild hyperinflation. Mild cardiomegaly with minimal central congestion. No focal pulmonary infiltrate or effusion. No pneumothorax. 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 IMPRESSION: Borderline to mild cardiomegaly with minimal central congestion. Electronically Signed   By: Donavan Foil M.D.   On: 03/26/2017 20:15   Dg Abd 1 View  Result Date: 03/27/2017 CLINICAL DATA:  66 year old male with history of sickle cell anemia and renal insufficiency presenting with obstipation. EXAM: ABDOMEN - 1 VIEW COMPARISON:  Abdominal radiograph dated 11/08/2016 and CT dated 11/04/2016 FINDINGS: There is no bowel dilatation or evidence of obstruction. Normal caliber air-filled loops of small bowel noted in the mid  abdomen. Right upper quadrant cholecystectomy clips and calcified infarct set splenic tissue in the left upper abdomen. There is diffuse heterogeneity of the bones with areas of sclerosis secondary to known sickle cell disease. No acute osseous pathology. IMPRESSION: No evidence of bowel obstruction. Diffuse heterogeneity and sclerotic appearance of the bones secondary to sickle cell disease. Calcified infarcted splenic tissue. Electronically Signed   By: Anner Crete M.D.   On: 03/27/2017 01:22    Scheduled Meds: . carvedilol  12.5 mg Oral BID  . enoxaparin (LOVENOX) injection  30 mg Subcutaneous Q24H  . folic acid  1 mg Oral Daily  . furosemide  80 mg Oral  Daily  . gabapentin  300 mg Oral QHS  . HYDROmorphone   Intravenous Q4H  . Influenza vac split quadrivalent PF  0.5 mL Intramuscular Tomorrow-1000  . insulin aspart  0-5 Units Subcutaneous QHS  . insulin aspart  0-9 Units Subcutaneous TID WC  . insulin glargine  10 Units Subcutaneous QHS  . oxyCODONE  10 mg Oral Q6H  . pantoprazole  40 mg Oral Daily  . senna-docusate  1 tablet Oral BID   Continuous Infusions: . sodium chloride 125 mL/hr at 03/29/17 0958  . diphenhydrAMINE (BENADRYL) IVPB(SICKLE CELL ONLY)      Active Problems:   Hypertension   Type 2 diabetes mellitus with renal complication (HCC)   Leukocytosis   Cocaine abuse (HCC)   Chronic kidney disease (CKD), stage IV (severe) (HCC)   Sickle cell pain crisis (HCC)   Sickle cell crisis (HCC)   Microcytic anemia   Obstipation   Renal insufficiency    In excess of 40 minutes spent during this visit. Greater than 50% involved face to face contact with the patient for assessment, counseling and coordination of care.

## 2017-03-29 NOTE — Progress Notes (Signed)
PT c/o left  chest and shoulder "cramping". VS recorded. MD updated. Denies SOB. Pain resolves when lying down

## 2017-03-30 DIAGNOSIS — N189 Chronic kidney disease, unspecified: Secondary | ICD-10-CM

## 2017-03-30 DIAGNOSIS — E872 Acidosis: Secondary | ICD-10-CM

## 2017-03-30 LAB — CBC WITH DIFFERENTIAL/PLATELET
Basophils Absolute: 0 10*3/uL (ref 0.0–0.1)
Basophils Relative: 0 %
EOS ABS: 0.4 10*3/uL (ref 0.0–0.7)
Eosinophils Relative: 2 %
HEMATOCRIT: 23.4 % — AB (ref 39.0–52.0)
Hemoglobin: 8 g/dL — ABNORMAL LOW (ref 13.0–17.0)
LYMPHS ABS: 2 10*3/uL (ref 0.7–4.0)
Lymphocytes Relative: 10 %
MCH: 24.3 pg — AB (ref 26.0–34.0)
MCHC: 34.2 g/dL (ref 30.0–36.0)
MCV: 71.1 fL — ABNORMAL LOW (ref 78.0–100.0)
Monocytes Absolute: 2.8 10*3/uL — ABNORMAL HIGH (ref 0.1–1.0)
Monocytes Relative: 14 %
NEUTROS ABS: 15.1 10*3/uL — AB (ref 1.7–7.7)
Neutrophils Relative %: 74 %
Platelets: 211 10*3/uL (ref 150–400)
RBC: 3.29 MIL/uL — AB (ref 4.22–5.81)
RDW: 22.9 % — AB (ref 11.5–15.5)
WBC: 20.3 10*3/uL — AB (ref 4.0–10.5)
nRBC: 2 /100 WBC — ABNORMAL HIGH

## 2017-03-30 LAB — URINE CULTURE: CULTURE: NO GROWTH

## 2017-03-30 LAB — BASIC METABOLIC PANEL
ANION GAP: 13 (ref 5–15)
Anion gap: 13 (ref 5–15)
BUN: 77 mg/dL — ABNORMAL HIGH (ref 6–20)
BUN: 76 mg/dL — AB (ref 6–20)
CALCIUM: 9 mg/dL (ref 8.9–10.3)
CO2: 19 mmol/L — ABNORMAL LOW (ref 22–32)
CO2: 19 mmol/L — AB (ref 22–32)
CREATININE: 4.93 mg/dL — AB (ref 0.61–1.24)
Calcium: 8.8 mg/dL — ABNORMAL LOW (ref 8.9–10.3)
Chloride: 100 mmol/L — ABNORMAL LOW (ref 101–111)
Chloride: 102 mmol/L (ref 101–111)
Creatinine, Ser: 5.02 mg/dL — ABNORMAL HIGH (ref 0.61–1.24)
GFR calc non Af Amer: 11 mL/min — ABNORMAL LOW (ref >60)
GFR, EST AFRICAN AMERICAN: 13 mL/min — AB (ref >60)
GFR, EST AFRICAN AMERICAN: 12 mL/min — AB (ref >60)
GFR, EST NON AFRICAN AMERICAN: 11 mL/min — AB (ref >60)
Glucose, Bld: 103 mg/dL — ABNORMAL HIGH (ref 65–99)
Glucose, Bld: 138 mg/dL — ABNORMAL HIGH (ref 65–99)
POTASSIUM: 4.9 mmol/L (ref 3.5–5.1)
Potassium: 5.1 mmol/L (ref 3.5–5.1)
SODIUM: 134 mmol/L — AB (ref 135–145)
SODIUM: 132 mmol/L — AB (ref 135–145)

## 2017-03-30 LAB — GLUCOSE, CAPILLARY
GLUCOSE-CAPILLARY: 64 mg/dL — AB (ref 65–99)
GLUCOSE-CAPILLARY: 68 mg/dL (ref 65–99)
GLUCOSE-CAPILLARY: 147 mg/dL — AB (ref 65–99)
Glucose-Capillary: 131 mg/dL — ABNORMAL HIGH (ref 65–99)
Glucose-Capillary: 133 mg/dL — ABNORMAL HIGH (ref 65–99)
Glucose-Capillary: 141 mg/dL — ABNORMAL HIGH (ref 65–99)

## 2017-03-30 LAB — TROPONIN I: Troponin I: <0.03 ng/mL (ref <0.03)

## 2017-03-30 MED ORDER — OXYCODONE HCL 5 MG PO TABS
10.0000 mg | ORAL_TABLET | Freq: Three times a day (TID) | ORAL | Status: DC
  Administered 2017-03-30 – 2017-04-02 (×9): 10 mg via ORAL
  Filled 2017-03-30 (×9): qty 2

## 2017-03-30 MED ORDER — CALCIUM CARBONATE ANTACID 500 MG PO CHEW
1.0000 | CHEWABLE_TABLET | Freq: Two times a day (BID) | ORAL | Status: DC
  Administered 2017-03-30 – 2017-04-02 (×3): 200 mg via ORAL
  Filled 2017-03-30 (×3): qty 1

## 2017-03-30 NOTE — Progress Notes (Signed)
Physical Therapy Treatment Patient Details Name: [name] MRN: [id number] DOB: 26-May-1951 Today's Date: 03/30/2017    History of Present Illness 66 y.o. male with medical history significant of type 2 diabetes, GERD, heart murmur, hypertension, pneumonia, chronic kidney disease stage IV, cocaine use, sickle cell anemia admitted with sickle cell crisis, CKD.     PT Comments    Pt ambulated 150' with RW with min/guard assist. Mild wheezing noted, SaO2 82% on RA with walking (RN notified). Gait was slow but steady, no loss of balance.  Distance limited by fatigue.   Follow Up Recommendations  Home health PT     Equipment Recommendations  None recommended by PT    Recommendations for Other Services       Precautions / Restrictions Precautions Precautions: Fall Precaution Comments: slid to one knee just PTA, otherwise denies falls in past 1 year Restrictions Weight Bearing Restrictions: No    Mobility  Bed Mobility               General bed mobility comments: oob  Transfers   Equipment used: Rolling walker (2 wheeled) Transfers: Sit to/from Stand Sit to Stand: Min guard         General transfer comment: from recliner;. cues for UE placement, increased time  Ambulation/Gait Ambulation/Gait assistance: Min guard Ambulation Distance (Feet): 150 Feet Assistive device: Rolling walker (2 wheeled) Gait Pattern/deviations: Step-through pattern;Decreased stride length Gait velocity: decreased Gait velocity interpretation: Below normal speed for age/gender General Gait Details: SaO2 82% on RA walking, mild wheezing noted (RN notified), no LOB; 3 standing rest breaks, decreased gait velocity   Stairs            Wheelchair Mobility    Modified Rankin (Stroke Patients Only)       Balance Overall balance assessment: Modified Independent                                          Cognition Arousal/Alertness: Awake/alert Behavior  During Therapy: WFL for tasks assessed/performed Overall Cognitive Status: Within Functional Limits for tasks assessed                                        Exercises      General Comments        Pertinent Vitals/Pain Pain Assessment: No/denies pain    Home Living                      Prior Function            PT Goals (current goals can now be found in the care plan section) Acute Rehab PT Goals Patient Stated Goal: likes to go fishing and to drag races but hasn't been able to recently PT Goal Formulation: With patient Time For Goal Achievement: 04/10/17 Potential to Achieve Goals: Good Progress towards PT goals: Progressing toward goals    Frequency    Min 3X/week      PT Plan Current plan remains appropriate    Co-evaluation              AM-PAC PT "6 Clicks" Daily Activity  Outcome Measure  Difficulty turning over in bed (including adjusting bedclothes, sheets and blankets)?: A Little Difficulty moving from lying on back to sitting on the side of  the bed? : A Little Difficulty sitting down on and standing up from a chair with arms (e.g., wheelchair, bedside commode, etc,.)?: A Little Help needed moving to and from a bed to chair (including a wheelchair)?: A Little Help needed walking in hospital room?: A Little Help needed climbing 3-5 steps with a railing? : A Little 6 Click Score: 18    End of Session Equipment Utilized During Treatment: Gait belt Activity Tolerance: Patient limited by fatigue Patient left: in chair;with call bell/phone within reach;with chair alarm set Nurse Communication: Mobility status PT Visit Diagnosis: History of falling (Z91.81);Difficulty in walking, not elsewhere classified (R26.2)     Time: 7026-3785 PT Time Calculation (min) (ACUTE ONLY): 16 min  Charges:  $Gait Training: 8-22 mins                    G Codes:          [name] 03/30/2017, 2:42  PM [phone]

## 2017-03-30 NOTE — Progress Notes (Addendum)
SICKLE CELL SERVICE PROGRESS NOTE  [name] POE:[id number] DOB: 22-Jun-1950 DOA: 03/26/2017 PCP: Nolene Ebbs, MD  Assessment/Plan: Active Problems:   Hypertension   Type 2 diabetes mellitus with renal complication (HCC)   Leukocytosis   Cocaine abuse (HCC)   Chronic kidney disease (CKD), stage IV (severe) (HCC)   Sickle cell pain crisis (HCC)   Sickle cell crisis (HCC)   Microcytic anemia   Obstipation   Renal insufficiency  1. Acute on Chronic CKD IV: Labs pending from today. Will re-assess when resulted. If still not trending towards baseline will consult Nephrology. Discontinue Lasix.  2. Left sided Chest pain: All troponin levels normal. Pain seems to be musculoskeletal and associated with sickel cell crisis or gas.Will give  3. Acute Urinary Retention: Started on Flomax yesterday and foley in place. He has had good urine output.  4. Hb SS with Crisis: Will discontinue PCA and change scheduled Oxycodone 10 mg q 8 hours in light of decreased renal function and sleepiness. Toradol contraindicated due to AKI. 5. DM II:BS currently well controlled. Continue Lantus and SSI.  6. HTN: BP elevated during episodes of pain but otherwise well controlled.  7. Anemia of Chronic Disease: Pt s/p transfusion of 2 units RBC's. Hb currently 9 g/dL. Which is much higher than his baseline.  8. Metabolic Acidosis: Feels that this is secondary to volume depleted state. Labs pending from today. I bicarbonate still low will consider oral sodium bicarbonate.  9. Leukocytosis: No evidence of infection. Labs pending from today.  Code Status: Full Code Family Communication: N/A Disposition Plan: Not yet ready for discharge  [place].  Pager [phone]. If 7PM-7AM, please contact night-coverage.  03/30/2017, 12:37 PM  LOS: 3 days   Interim History: Pt states that his pain is negligible until he moves around then he has pain the the left chest wall. He has used only 4.8 mg of Dilaudid via  the PCA with 9/6:demands/deliveries in the last 24 hours. He had 1750 ml of urine output yesterday. Oxygen saturations 97% on RA at rest and HR 81 BPM at rest.  Consultants:  None  Procedures:  None  Antibiotics:  Zosyn 10/24 >>10/25   Objective: Vitals:   03/30/17 0331 03/30/17 0545 03/30/17 0800 03/30/17 1200  BP:  (!) 143/73    Pulse:  73    Resp: 10 11 12 16   Temp:  98.5 F (36.9 C)    TempSrc:  Oral    SpO2: 97% 99% 95% 96%  Weight:      Height:       Weight change:   Intake/Output Summary (Last 24 hours) at 03/30/17 1237 Last data filed at 03/30/17 0600  Gross per 24 hour  Intake             2500 ml  Output             1350 ml  Net             1150 ml     Physical Exam General: Alert, awake, oriented x3, in no apparent distress.  HEENT: [place]/AT PEERL, EOMI, anicteric Neck: Trachea midline,  no masses, no thyromegal,y no JVD, no carotid bruit OROPHARYNX:  Moist, No exudate/ erythema/lesions.  Heart: Regular rate and rhythm, without murmurs, rubs, gallops, PMI non-displaced, no heaves or thrills on palpation.  Lungs: Clear to auscultation, no wheezing or rhonchi noted. No increased vocal fremitus resonant to percussion  Abdomen: Soft, nontender, nondistended, positive bowel sounds, no masses no hepatosplenomegaly noted.  Neuro: No focal neurological deficits noted cranial nerves II through XII grossly intact. Strength at baseline in bilateral upper and lower extremities. Musculoskeletal: No warmth swelling or erythema around joints, no spinal tenderness noted. Psychiatric: Patient alert and oriented x3, good insight and cognition, good recent to remote recall.     Data Reviewed: Basic Metabolic Panel:  Recent Labs Lab 03/26/17 1942 03/27/17 0829 03/29/17 0430 03/29/17 1745 03/30/17 0032  NA 133*  --  131* 131* 132*  K 4.8  --  5.4* 5.2* 4.9  CL 102  --  100* 98* 100*  CO2 19*  --  19* 20* 19*  GLUCOSE 202*  --  85 108* 103*  BUN 33*  --  74* 78*  77*  CREATININE 3.42*  --  5.01* 5.13* 5.02*  CALCIUM 9.5  --  9.1 8.8* 8.8*  MG  --  1.8  --   --   --   PHOS  --  4.6  --   --   --    Liver Function Tests:  Recent Labs Lab 03/29/17 0430  AST 36  ALT 18  ALKPHOS 131*  BILITOT 3.2*  PROT 7.5  ALBUMIN 3.4*   No results for input(s): LIPASE, AMYLASE in the last 168 hours. No results for input(s): AMMONIA in the last 168 hours. CBC:  Recent Labs Lab 03/26/17 1942 03/27/17 0829 03/28/17 0920 03/29/17 0430  WBC 21.9*  --  32.8* 26.3*  NEUTROABS  --   --  23.9* 20.3*  HGB 6.6* 6.9* 8.2* 9.0*  HCT 20.3* 20.7* 24.0* 25.4*  MCV 67.2*  --  69.6* 68.8*  PLT 245  --  203 198   Cardiac Enzymes:  Recent Labs Lab 03/27/17 0013 03/27/17 0829 03/29/17 1251 03/29/17 1745 03/30/17 0032  TROPONINI <0.03 <0.03 <0.03 <0.03 <0.03   BNP (last 3 results)  Recent Labs  01/23/17 1947  BNP 569.9*    ProBNP (last 3 results) No results for input(s): PROBNP in the last 8760 hours.  CBG:  Recent Labs Lab 03/29/17 1718 03/29/17 2119 03/30/17 0804 03/30/17 0836 03/30/17 0943  GLUCAP 123* 81 64* 68 131*    Recent Results (from the past 240 hour(s))  Culture, blood (routine x 2)     Status: None (Preliminary result)   Collection Time: 03/28/17  2:51 PM  Result Value Ref Range Status   Specimen Description BLOOD RIGHT HAND  Final   Special Requests IN PEDIATRIC BOTTLE Blood Culture adequate volume  Final   Culture   Final    NO GROWTH 2 DAYS Performed at [facility], [place] [address] [place], [place] [address]    Report Status PENDING  Incomplete  Culture, blood (routine x 2)     Status: None (Preliminary result)   Collection Time: 03/28/17  2:57 PM  Result Value Ref Range Status   Specimen Description BLOOD RIGHT FOREARM  Final   Special Requests IN PEDIATRIC BOTTLE Blood Culture adequate volume  Final   Culture   Final    NO GROWTH 2 DAYS Performed at [facility], [place] [address] [place], [place]  [address]    Report Status PENDING  Incomplete  Culture, Urine     Status: None   Collection Time: 03/29/17  4:08 AM  Result Value Ref Range Status   Specimen Description URINE, CLEAN CATCH  Final   Special Requests NONE  Final   Culture   Final    NO GROWTH Performed at [facility]  Lab, [address], [place], [place] [address]    Report Status 03/30/2017 FINAL  Final     Studies: Dg Chest 2 View  Result Date: 03/26/2017 CLINICAL DATA:  Chest pain shortness of breath, history of sickle cell EXAM: CHEST  2 VIEW COMPARISON:  01/23/2017, 08/10/2016 FINDINGS: Mild hyperinflation. Mild cardiomegaly with minimal central congestion. No focal pulmonary infiltrate or effusion. No pneumothorax. 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 IMPRESSION: Borderline to mild cardiomegaly with minimal central congestion. Electronically Signed   By: Donavan Foil M.D.   On: 03/26/2017 20:15   Dg Abd 1 View  Result Date: 03/27/2017 CLINICAL DATA:  66 year old male with history of sickle cell anemia and renal insufficiency presenting with obstipation. EXAM: ABDOMEN - 1 VIEW COMPARISON:  Abdominal radiograph dated 11/08/2016 and CT dated 11/04/2016 FINDINGS: There is no bowel dilatation or evidence of obstruction. Normal caliber air-filled loops of small bowel noted in the mid abdomen. Right upper quadrant cholecystectomy clips and calcified infarct set splenic tissue in the left upper abdomen. There is diffuse heterogeneity of the bones with areas of sclerosis secondary to known sickle cell disease. No acute osseous pathology. IMPRESSION: No evidence of bowel obstruction. Diffuse heterogeneity and sclerotic appearance of the bones secondary to sickle cell disease. Calcified infarcted splenic tissue. Electronically Signed   By: Anner Crete M.D.   On: 03/27/2017 01:22    Scheduled Meds: . carvedilol  12.5 mg Oral BID  . enoxaparin (LOVENOX) injection  30 mg  Subcutaneous Q24H  . folic acid  1 mg Oral Daily  . furosemide  80 mg Oral Daily  . gabapentin  300 mg Oral QHS  . insulin aspart  0-5 Units Subcutaneous QHS  . insulin aspart  0-9 Units Subcutaneous TID WC  . insulin glargine  10 Units Subcutaneous QHS  . oxyCODONE  10 mg Oral Q8H  . pantoprazole  40 mg Oral Daily  . senna-docusate  1 tablet Oral BID  . tamsulosin  0.4 mg Oral Daily   Continuous Infusions: . sodium chloride 125 mL/hr at 03/30/17 6045    Active Problems:   Hypertension   Type 2 diabetes mellitus with renal complication (HCC)   Leukocytosis   Cocaine abuse (HCC)   Chronic kidney disease (CKD), stage IV (severe) (HCC)   Sickle cell pain crisis (HCC)   Sickle cell crisis (HCC)   Microcytic anemia   Obstipation   Renal insufficiency    In excess of 45 minutes spent during this visit. Greater than 50% involved face to face contact with the patient for assessment, counseling and coordination of care.    Addendum: 03/30/2017 3:06 pm. Labs reviewed and Cr is trending down. Per office records, baseline Cr is 4-4.5. Pt continues to have an elevated WBC without any evidence of infection.  Will recheck WBC and renal function tomorrow and consider discontinuing IVF when Cr at <4.5.  Jaeshawn Silvio A.

## 2017-03-30 NOTE — Care Management Important Message (Signed)
Important Message  Patient Details  Name: DOUGLES KIMMEY MRN: [id number] Date of Birth: 09/10/1950   Medicare Important Message Given:  Yes    Kerin Salen 03/30/2017, 11:32 AMImportant Message  Patient Details  Name: CUAUHTEMOC HUEGEL MRN: [id number] Date of Birth: January 03, 1951   Medicare Important Message Given:  Yes    Kerin Salen 03/30/2017, 11:32 AM

## 2017-03-30 NOTE — Progress Notes (Signed)
Inpatient Diabetes Program Recommendations  AACE/ADA: New Consensus Statement on Inpatient Glycemic Control (2015)  Target Ranges:  Prepandial:   less than 140 mg/dL      Peak postprandial:   less than 180 mg/dL (1-2 hours)      Critically ill patients:  140 - 180 mg/dL   Results for ASCHER, SCHROEPFER (MRN [id number]) as of 03/30/2017 13:00  Ref. Range 03/30/2017 08:04 03/30/2017 08:36 03/30/2017 09:43 03/30/2017 12:35  Glucose-Capillary Latest Ref Range: 65 - 99 mg/dL 64 (L) 68 131 (H) 133 (H)    Home DM Meds: Lantus 20 units QHS       Novolog 2-6 units TID per SSI       Onglyza 2.5 mg daily  Current Insulin Orders: Lantus 10 units QHS        Novolog Sensitive Correction Scale/ SSI (0-9 units) TID AC + HS      MD- Note patient with mild Hypoglycemia this AM.  If this trend continues, please consider slight reduction of Lantus to 8 units QHS      --Will follow patient during hospitalization--  Wyn Quaker RN, MSN, CDE Diabetes Coordinator Inpatient Glycemic Control Team Team Pager: [phone] (8a-5p)

## 2017-03-30 NOTE — Progress Notes (Signed)
PCA discontinued. 14cc Dilaudid wasted in sink with Dominic Pea RN.

## 2017-03-31 LAB — CBC WITH DIFFERENTIAL/PLATELET
BASOS ABS: 0.2 10*3/uL — AB (ref 0.0–0.1)
Basophils Relative: 1 %
Eosinophils Absolute: 0.5 10*3/uL (ref 0.0–0.7)
Eosinophils Relative: 3 %
HEMATOCRIT: 18.8 % — AB (ref 39.0–52.0)
Hemoglobin: 6.4 g/dL — CL (ref 13.0–17.0)
LYMPHS ABS: 1.7 10*3/uL (ref 0.7–4.0)
Lymphocytes Relative: 10 %
MCH: 24.2 pg — ABNORMAL LOW (ref 26.0–34.0)
MCHC: 34 g/dL (ref 30.0–36.0)
MCV: 71.2 fL — ABNORMAL LOW (ref 78.0–100.0)
MONO ABS: 2.8 10*3/uL — AB (ref 0.1–1.0)
MONOS PCT: 16 %
Neutro Abs: 12 10*3/uL — ABNORMAL HIGH (ref 1.7–7.7)
Neutrophils Relative %: 70 %
PLATELETS: 211 10*3/uL (ref 150–400)
RBC: 2.64 MIL/uL — ABNORMAL LOW (ref 4.22–5.81)
RDW: 23.1 % — AB (ref 11.5–15.5)
WBC: 17.2 10*3/uL — ABNORMAL HIGH (ref 4.0–10.5)

## 2017-03-31 LAB — BASIC METABOLIC PANEL
Anion gap: 10 (ref 5–15)
BUN: 73 mg/dL — AB (ref 6–20)
CO2: 20 mmol/L — ABNORMAL LOW (ref 22–32)
CREATININE: 4.76 mg/dL — AB (ref 0.61–1.24)
Calcium: 8.7 mg/dL — ABNORMAL LOW (ref 8.9–10.3)
Chloride: 107 mmol/L (ref 101–111)
GFR calc Af Amer: 13 mL/min — ABNORMAL LOW (ref >60)
GFR, EST NON AFRICAN AMERICAN: 11 mL/min — AB (ref >60)
GLUCOSE: 100 mg/dL — AB (ref 65–99)
Potassium: 4.5 mmol/L (ref 3.5–5.1)
SODIUM: 137 mmol/L (ref 135–145)

## 2017-03-31 LAB — GLUCOSE, CAPILLARY
GLUCOSE-CAPILLARY: 107 mg/dL — AB (ref 65–99)
Glucose-Capillary: 98 mg/dL (ref 65–99)
Glucose-Capillary: 148 mg/dL — ABNORMAL HIGH (ref 65–99)
Glucose-Capillary: 125 mg/dL — ABNORMAL HIGH (ref 65–99)

## 2017-03-31 NOTE — Progress Notes (Signed)
Subjective: 66 year old gentleman admitted with acute on chronic kidney disease stage IV with sickle cell crisis. Patient has been doing fine except for acute kidney injury. Creatinine today is at baseline of 4.69. He's also anemic with hemoglobin dropping to 6.4 from 8.0 yesterday. He has been on IV fluids which may have accounted for that. He's been weak and tired and has been seen by PT and OT. Patient is also diabetic with hypertension and past history of cocaine abuse. His pain is now controlled.  Objective: Vital signs in last 24 hours: Temp:  [98.9 F (37.2 C)-99 F (37.2 C)] 98.9 F (37.2 C) (10/27 0517) Pulse Rate:  [77-85] 77 (10/27 0517) Resp:  [14-16] 16 (10/27 0517) BP: (135-167)/(69-76) 146/69 (10/27 0517) SpO2:  [90 %-96 %] 92 % (10/27 0529) Weight change:  Last BM Date: 03/30/17  Intake/Output from previous day: 10/26 0701 - 10/27 0700 In: 2675 [I.V.:2625] Out: 2400 [Urine:2400] Intake/Output this shift: No intake/output data recorded.  General appearance: alert, cooperative, appears stated age and no distress Neck: no adenopathy, no carotid bruit, no JVD, supple, symmetrical, trachea midline and thyroid not enlarged, symmetric, no tenderness/mass/nodules Back: symmetric, no curvature. ROM normal. No CVA tenderness. Resp: clear to auscultation bilaterally Cardio: regular rate and rhythm, S1, S2 normal, no murmur, click, rub or gallop GI: soft, non-tender; bowel sounds normal; no masses,  no organomegaly Extremities: extremities normal, atraumatic, no cyanosis or edema Pulses: 2+ and symmetric Skin: Skin color, texture, turgor normal. No rashes or lesions Neurologic: Grossly normal  Lab Results:  Recent Labs  03/30/17 1306 03/31/17 0709  WBC 20.3* 17.2*  HGB 8.0* 6.4*  HCT 23.4* 18.8*  PLT 211 211   BMET  Recent Labs  03/30/17 1306 03/31/17 0709  NA 134* 137  K 5.1 4.5  CL 102 107  CO2 19* 20*  GLUCOSE 138* 100*  BUN 76* 73*  CREATININE 4.93*  4.76*  CALCIUM 9.0 8.7*    Studies/Results: No results found.  Medications: I have reviewed the patient's current medications.  Assessment/Plan: A 66 year old gentleman here with sickle cell painful crisis. Also acute on chronic kidney disease  #1 acute on chronic kidney failure: BUN and creatinine as close to his baseline. It is currently 4.76. Patient is also feeling better. At this point we will defer to outpatient nephrology follow-up. Continue to hold off Lasix  #2 sickle cell anemia: Patient's baseline hemoglobin is around 6.0. His hemoglobin drop overnight is probably due to IV fluids. We will repeat H&H. Monitor and if it drops we'll transfuse again. He is already received 2 units of packed red blood cells this admission.  #3 hypertension: Blood pressure appears well controlled. No change in medications  #4 diabetes: Blood sugar is again controlled on current regimen. No change in medication  #5 hyponatremia: Sodium is now back to normal range.  #6 leukocytosis: White count appears to be improving also. Continue to monitor   LOS: 4 days   Samreet [place],LAWAL 03/31/2017, 8:28 AM

## 2017-03-31 NOTE — Progress Notes (Signed)
OT Cancellation Note  Patient Details Name: [name] MRN: [id number] DOB: [birth year]   Cancelled Treatment:    Reason Eval/Treat Not Completed: Medical issues which prohibited therapy.  Hbg 6.4.  Will reattempt.  Starr School, OTR/L [phone]   Lucille Passy M 03/31/2017, 1:24 PM

## 2017-03-31 NOTE — Progress Notes (Signed)
Updated MD on pt last 3 hgb labs. MD with no new order. Will re draw in the morning

## 2017-03-31 NOTE — Progress Notes (Signed)
CRITICAL VALUE ALERT  Critical Value:  hgb 6.2  Date & Time Notied:  10/27 0805  Provider Notified: 10/27 0810  Orders Received/Actions taken: 10/27 6754

## 2017-04-01 LAB — CBC WITH DIFFERENTIAL/PLATELET
BAND NEUTROPHILS: 6 %
BASOS ABS: 0.3 10*3/uL — AB (ref 0.0–0.1)
BASOS PCT: 2 %
BLASTS: 0 %
EOS ABS: 0.3 10*3/uL (ref 0.0–0.7)
Eosinophils Relative: 2 %
HCT: 19.9 % — ABNORMAL LOW (ref 39.0–52.0)
HEMOGLOBIN: 6.4 g/dL — AB (ref 13.0–17.0)
LYMPHS ABS: 1.3 10*3/uL (ref 0.7–4.0)
Lymphocytes Relative: 8 %
MCH: 23.1 pg — AB (ref 26.0–34.0)
MCHC: 32.2 g/dL (ref 30.0–36.0)
MCV: 71.8 fL — ABNORMAL LOW (ref 78.0–100.0)
METAMYELOCYTES PCT: 0 %
MONO ABS: 3.1 10*3/uL — AB (ref 0.1–1.0)
MONOS PCT: 19 %
Myelocytes: 0 %
NEUTROS ABS: 11.1 10*3/uL — AB (ref 1.7–7.7)
Neutrophils Relative %: 61 %
Other: 2 %
PLATELETS: 243 10*3/uL (ref 150–400)
Promyelocytes Absolute: 0 %
RBC: 2.77 MIL/uL — ABNORMAL LOW (ref 4.22–5.81)
RDW: 22.6 % — AB (ref 11.5–15.5)
WBC: 16.5 10*3/uL — ABNORMAL HIGH (ref 4.0–10.5)
nRBC: 0 /100 WBC

## 2017-04-01 LAB — COMPREHENSIVE METABOLIC PANEL
ALBUMIN: 2.8 g/dL — AB (ref 3.5–5.0)
ALT: 12 U/L — ABNORMAL LOW (ref 17–63)
ANION GAP: 9 (ref 5–15)
AST: 17 U/L (ref 15–41)
Alkaline Phosphatase: 107 U/L (ref 38–126)
BUN: 70 mg/dL — ABNORMAL HIGH (ref 6–20)
CHLORIDE: 106 mmol/L (ref 101–111)
CO2: 21 mmol/L — AB (ref 22–32)
Calcium: 8.9 mg/dL (ref 8.9–10.3)
Creatinine, Ser: 4.43 mg/dL — ABNORMAL HIGH (ref 0.61–1.24)
GFR calc non Af Amer: 12 mL/min — ABNORMAL LOW (ref >60)
GFR, EST AFRICAN AMERICAN: 14 mL/min — AB (ref >60)
GLUCOSE: 158 mg/dL — AB (ref 65–99)
Potassium: 5.2 mmol/L — ABNORMAL HIGH (ref 3.5–5.1)
SODIUM: 136 mmol/L (ref 135–145)
Total Bilirubin: 2.7 mg/dL — ABNORMAL HIGH (ref 0.3–1.2)
Total Protein: 6.9 g/dL (ref 6.5–8.1)

## 2017-04-01 LAB — GLUCOSE, CAPILLARY
GLUCOSE-CAPILLARY: 132 mg/dL — AB (ref 65–99)
GLUCOSE-CAPILLARY: 114 mg/dL — AB (ref 65–99)
Glucose-Capillary: 144 mg/dL — ABNORMAL HIGH (ref 65–99)
Glucose-Capillary: 132 mg/dL — ABNORMAL HIGH (ref 65–99)

## 2017-04-01 NOTE — Progress Notes (Signed)
Subjective: Patient doing much better today with no complaints. Hemoglobin stays stable at 6.4 but potassium is up to 5.2 today. Renal function also continues to improve with creatinine of 4.43. Blood sugar is well control and blood pressure is also well controlled. He is not having any pain and has been off the Dilaudid PCA for couple of days now.  Objective: Vital signs in last 24 hours: Temp:  [98.4 F (36.9 C)-99.2 F (37.3 C)] 98.4 F (36.9 C) (10/28 0411) Pulse Rate:  [75-77] 77 (10/28 0411) Resp:  [14-18] 14 (10/28 0411) BP: (146-148)/(62-73) 148/69 (10/28 0411) SpO2:  [93 %-100 %] 95 % (10/28 0411) Weight change:  Last BM Date: 04/01/17  Intake/Output from previous day: 10/27 0701 - 10/28 0700 In: 480 [P.O.:480] Out: 2125 [Urine:2125] Intake/Output this shift: No intake/output data recorded.  General appearance: alert, cooperative, appears stated age and no distress Neck: no adenopathy, no carotid bruit, no JVD, supple, symmetrical, trachea midline and thyroid not enlarged, symmetric, no tenderness/mass/nodules Back: symmetric, no curvature. ROM normal. No CVA tenderness. Resp: clear to auscultation bilaterally Cardio: regular rate and rhythm, S1, S2 normal, no murmur, click, rub or gallop GI: soft, non-tender; bowel sounds normal; no masses,  no organomegaly Extremities: extremities normal, atraumatic, no cyanosis or edema Pulses: 2+ and symmetric Skin: Skin color, texture, turgor normal. No rashes or lesions Neurologic: Grossly normal  Lab Results:  Recent Labs  03/31/17 0709 04/01/17 0842  WBC 17.2* 16.5*  HGB 6.4* 6.4*  HCT 18.8* 19.9*  PLT 211 243   BMET  Recent Labs  03/31/17 0709 04/01/17 0842  NA 137 136  K 4.5 5.2*  CL 107 106  CO2 20* 21*  GLUCOSE 100* 158*  BUN 73* 70*  CREATININE 4.76* 4.43*  CALCIUM 8.7* 8.9    Studies/Results: No results found.  Medications: I have reviewed the patient's current medications.  Assessment/Plan: A  66 year old gentleman here with sickle cell painful crisis. Also acute on chronic kidney disease  #1 acute on chronic kidney failure: BUN and creatinine at his baseline.  Continue to hold off Lasix monitor. Plan is to discharge in the morning and outpatient follow-up with nephrology  #2 sickle cell anemia: Patient's hemoglobin is at 6.4 which is his baseline. No more transfusion now  #3 hypertension: Blood pressure appears well controlled. No change in medications  #4 diabetes: Blood sugar is again controlled on current regimen. No change in medication  #5 hyponatremia: Sodium is now back to normal range.  #6 leukocytosis: White count appears to be improving also. Continue to monitor  #7 hyperkalemia: Borderline high. May be due to hemolysis. Recheck and if elevated will try Kayexalate.   LOS: 5 days   [name],LAWAL 04/01/2017, 2:53 PM

## 2017-04-02 DIAGNOSIS — E860 Dehydration: Secondary | ICD-10-CM

## 2017-04-02 DIAGNOSIS — N139 Obstructive and reflux uropathy, unspecified: Secondary | ICD-10-CM

## 2017-04-02 LAB — CULTURE, BLOOD (ROUTINE X 2)
Culture: NO GROWTH
Culture: NO GROWTH
SPECIAL REQUESTS: ADEQUATE
SPECIAL REQUESTS: ADEQUATE

## 2017-04-02 LAB — CBC WITH DIFFERENTIAL/PLATELET
BASOS ABS: 0.2 10*3/uL — AB (ref 0.0–0.1)
Basophils Relative: 1 %
EOS PCT: 4 %
Eosinophils Absolute: 0.7 10*3/uL (ref 0.0–0.7)
HEMATOCRIT: 21.2 % — AB (ref 39.0–52.0)
Hemoglobin: 7 g/dL — ABNORMAL LOW (ref 13.0–17.0)
LYMPHS PCT: 20 %
Lymphs Abs: 3.3 10*3/uL (ref 0.7–4.0)
MCH: 23.9 pg — ABNORMAL LOW (ref 26.0–34.0)
MCHC: 33 g/dL (ref 30.0–36.0)
MCV: 72.4 fL — AB (ref 78.0–100.0)
MONOS PCT: 16 %
Monocytes Absolute: 2.7 10*3/uL — ABNORMAL HIGH (ref 0.1–1.0)
NEUTROS PCT: 59 %
Neutro Abs: 9.8 10*3/uL — ABNORMAL HIGH (ref 1.7–7.7)
Platelets: 264 10*3/uL (ref 150–400)
RBC: 2.93 MIL/uL — AB (ref 4.22–5.81)
RDW: 22.6 % — AB (ref 11.5–15.5)
WBC: 16.7 10*3/uL — AB (ref 4.0–10.5)

## 2017-04-02 LAB — COMPREHENSIVE METABOLIC PANEL
ALBUMIN: 2.7 g/dL — AB (ref 3.5–5.0)
ALT: 11 U/L — AB (ref 17–63)
AST: 17 U/L (ref 15–41)
Alkaline Phosphatase: 104 U/L (ref 38–126)
Anion gap: 10 (ref 5–15)
BILIRUBIN TOTAL: 2.7 mg/dL — AB (ref 0.3–1.2)
BUN: 60 mg/dL — AB (ref 6–20)
CO2: 22 mmol/L (ref 22–32)
CREATININE: 3.94 mg/dL — AB (ref 0.61–1.24)
Calcium: 9.3 mg/dL (ref 8.9–10.3)
Chloride: 108 mmol/L (ref 101–111)
GFR calc Af Amer: 17 mL/min — ABNORMAL LOW (ref >60)
GFR, EST NON AFRICAN AMERICAN: 14 mL/min — AB (ref >60)
GLUCOSE: 93 mg/dL (ref 65–99)
POTASSIUM: 5.4 mmol/L — AB (ref 3.5–5.1)
Sodium: 140 mmol/L (ref 135–145)
TOTAL PROTEIN: 7.2 g/dL (ref 6.5–8.1)

## 2017-04-02 LAB — GLUCOSE, CAPILLARY
GLUCOSE-CAPILLARY: 132 mg/dL — AB (ref 65–99)
Glucose-Capillary: 90 mg/dL (ref 65–99)

## 2017-04-02 MED ORDER — TAMSULOSIN HCL 0.4 MG PO CAPS: 0.4000 mg | ORAL_CAPSULE | Freq: Every day | ORAL | 0 refills | Status: DC

## 2017-04-02 MED ORDER — SODIUM POLYSTYRENE SULFONATE 15 GM/60ML PO SUSP: ORAL | 0 refills | Status: DC

## 2017-04-02 MED ORDER — FOLIC ACID 1 MG PO TABS: 2.0000 mg | ORAL_TABLET | Freq: Every day | ORAL | Status: DC

## 2017-04-02 MED ORDER — SODIUM POLYSTYRENE SULFONATE 15 GM/60ML PO SUSP: 30.0000 g | ORAL | Status: DC

## 2017-04-02 MED ORDER — FUROSEMIDE 80 MG PO TABS: 40.0000 mg | ORAL_TABLET | Freq: Every day | ORAL | 0 refills | Status: DC | PRN

## 2017-04-02 NOTE — Progress Notes (Signed)
Patient's resting O2 sat on Room Air is 95%. After ambulation at rest his O2 sat on Room Air is 90% after one minute of rest. Justin Post, RN

## 2017-04-02 NOTE — Progress Notes (Signed)
Pt will discharge home with HHRN/[place] at home. Will need HHRN orders with face to face please.

## 2017-04-02 NOTE — Care Management Important Message (Signed)
Important Message  Patient Details  Name: [name] MRN: [id number] Date of Birth: 12-19-1950   Medicare Important Message Given:  Yes    Kerin Salen 04/02/2017, 11:27 [place] Message  Patient Details  Name: [name] MRN: [id number] Date of Birth: Apr 08, 1951   Medicare Important Message Given:  Yes    Kerin Salen 04/02/2017, 11:27 AM

## 2017-04-02 NOTE — Progress Notes (Signed)
Occupational Therapy Treatment Patient Details Name: [name] MRN: [id number] DOB: [birth year] Today's Date: 04/02/2017    History of present illness 66 y.o. male with medical history significant of type 2 diabetes, GERD, heart murmur, hypertension, pneumonia, chronic kidney disease stage IV, cocaine use, sickle cell anemia admitted with sickle cell crisis, CKD.    OT comments  Wife will A as needed     Equipment Recommendations  None recommended by OT    Recommendations for Other Services      Precautions / Restrictions Precautions Precautions: Fall       Mobility Bed Mobility Overal bed mobility: Modified Independent                Transfers Overall transfer level: Needs assistance Equipment used: Rolling walker (2 wheeled) Transfers: Sit to/from [facility] Sit to Stand: Supervision Stand pivot transfers: Supervision                ADL either performed or assessed with clinical judgement   ADL Overall ADL's : Needs assistance/impaired                     Lower Body Dressing: Moderate assistance;Sit to/from stand;Cueing for safety;Cueing for sequencing   Toilet Transfer: Minimal assistance;BSC;Stand-pivot;Comfort height toilet   Toileting- Clothing Manipulation and Hygiene: Minimal assistance;Sit to/from stand;Cueing for safety;Adhering to back precautions         General ADL Comments: pt states wife will A as needed at home. Educated pt on donning pants with foley bag. Pt verbalized understanding then stated he has done it before.               Cognition Arousal/Alertness: Awake/alert Behavior During Therapy: WFL for tasks assessed/performed Overall Cognitive Status: Within Functional Limits for tasks assessed                                                     Pertinent Vitals/ Pain       Pain Assessment: No/denies pain      Progress Toward Goals  OT Goals(current goals can now be  found in the care plan section)  Progress towards OT goals: Progressing toward goals     Plan Discharge plan remains appropriate       AM-PAC PT "6 Clicks" Daily Activity     Outcome Measure   Help from another person eating meals?: None Help from another person taking care of personal grooming?: None Help from another person toileting, which includes using toliet, bedpan, or urinal?: A Little Help from another person bathing (including washing, rinsing, drying)?: A Little Help from another person to put on and taking off regular upper body clothing?: None Help from another person to put on and taking off regular lower body clothing?: A Little 6 Click Score: 21    End of Session    OT Visit Diagnosis: Muscle weakness (generalized) (M62.81)   Activity Tolerance Patient limited by fatigue   Patient Left in bed;with call bell/phone within reach   Nurse Communication Mobility status        Time: 5170-0174 OT Time Calculation (min): 15 min  Charges: OT General Charges $OT Visit: 1 Visit OT Treatments $Self Care/Home Management : 8-22 mins  [name], [name]   [name] 04/02/2017, 2:47 PM

## 2017-04-02 NOTE — Discharge Summary (Signed)
[name] MRN: [id number] DOB/AGE: 68/18/1952 68 y.o.  Admit date: 03/26/2017 Discharge date: 04/02/2017  Primary Care Physician:  Nolene Ebbs, MD   Discharge Diagnoses:   Patient Active Problem List   Diagnosis Date Noted  . Acute urinary obstruction 03/29/2017  . Microcytic anemia   . Heart murmur 03/27/2016  . Chronic kidney disease (CKD), stage IV (severe) ([place]) 03/26/2016  . Hb-SS disease without crisis ([place])   . Hyperkalemia, diminished renal excretion 09/03/2014  . Leukocytosis 07/28/2011  . LBBB (left bundle branch block) 07/28/2011  . Hypertension   . Type 2 diabetes mellitus with renal complication (HCC)     DISCHARGE MEDICATION: Allergies as of 04/02/2017      Reactions   Morphine And Related Itching      Medication List    TAKE these medications   acetaminophen 500 MG tablet Commonly known as:  TYLENOL Take 500 mg by mouth every 6 (six) hours as needed for headache (pain).   amLODipine 10 MG tablet Commonly known as:  NORVASC Take 1 tablet (10 mg total) by mouth daily.   carvedilol 12.5 MG tablet Commonly known as:  COREG Take 12.5 mg by mouth 2 (two) times daily.   folic acid 1 MG tablet Commonly known as:  FOLVITE Take 1 tablet (1 mg total) by mouth daily.   furosemide 80 MG tablet Commonly known as:  LASIX Take 0.5 tablets (40 mg total) by mouth daily as needed for edema (Weight more than 2 pounds above 177 pounds). What changed:  how much to take  when to take this  reasons to take this   gabapentin 100 MG capsule Commonly known as:  NEURONTIN Take 1 capsule (100 mg total) by mouth 3 (three) times daily. What changed:  how much to take  when to take this   insulin glargine 100 unit/mL Sopn Commonly known as:  LANTUS Inject 20 Units into the skin at bedtime.   NOVOLOG FLEXPEN 100 UNIT/ML FlexPen Generic drug:  insulin aspart Inject 2-6 Units into the skin 3 (three) times daily as needed for high blood sugar (CBG >150).    omeprazole 20 MG capsule Commonly known as:  PRILOSEC Take 20 mg by mouth daily.   oxyCODONE 5 MG immediate release tablet Commonly known as:  ROXICODONE Take 1 tablet (5 mg total) by mouth every 6 (six) hours as needed. What changed:  reasons to take this   polyethylene glycol powder powder Commonly known as:  MIRALAX Take 17 g by mouth daily. What changed:  when to take this  reasons to take this  additional instructions   saxagliptin HCl 5 MG Tabs tablet Commonly known as:  ONGLYZA Take 0.5 tablets (2.5 mg total) by mouth daily.   SENNA LAXATIVE PO Take 1 tablet by mouth daily as needed (constipation).   sodium polystyrene 15 GM/60ML Commonly known as:  KAYEXALATE Take 30 g (120ML) by mouth 2 x week on Monday and Friday. Next dose on Friday   STOOL SOFTENER PO Take 1 capsule by mouth daily as needed (constipation).   tamsulosin 0.4 MG Caps capsule Commonly known as:  FLOMAX Take 1 capsule (0.4 mg total) by mouth daily.         Consults:    SIGNIFICANT DIAGNOSTIC STUDIES:  Dg Chest 2 View  Result Date: 03/26/2017 CLINICAL DATA:  Chest pain shortness of breath, history of sickle cell EXAM: CHEST  2 VIEW COMPARISON:  01/23/2017, 08/10/2016 FINDINGS: Mild hyperinflation. Mild cardiomegaly with minimal central congestion. No  focal pulmonary infiltrate or effusion. No pneumothorax. 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 IMPRESSION: Borderline to mild cardiomegaly with minimal central congestion. Electronically Signed   By: Donavan Foil M.D.   On: 03/26/2017 20:15   Dg Abd 1 View  Result Date: 03/27/2017 CLINICAL DATA:  66 year old male with history of sickle cell anemia and renal insufficiency presenting with obstipation. EXAM: ABDOMEN - 1 VIEW COMPARISON:  Abdominal radiograph dated 11/08/2016 and CT dated 11/04/2016 FINDINGS: There is no bowel dilatation or evidence of obstruction. Normal Justin air-filled loops of  small bowel noted in the mid abdomen. Right upper quadrant cholecystectomy clips and calcified infarct set splenic tissue in the left upper abdomen. There is diffuse heterogeneity of the bones with areas of sclerosis secondary to known sickle cell disease. No acute osseous pathology. IMPRESSION: No evidence of bowel obstruction. Diffuse heterogeneity and sclerotic appearance of the bones secondary to sickle cell disease. Calcified infarcted splenic tissue. Electronically Signed   By: Anner Crete M.D.   On: 03/27/2017 01:22     ECHO: 03/27/2017   Study Conclusions  - Left ventricle: The cavity size was normal. There was moderate   concentric hypertrophy. Systolic function was normal. The   estimated ejection fraction was in the range of 55% to 60%. Wall   motion was normal; there were no regional wall motion   abnormalities. There was an increased relative contribution of   atrial contraction to ventricular filling. Doppler parameters are   consistent with abnormal left ventricular relaxation (grade 1   diastolic dysfunction). Doppler parameters are consistent with   high ventricular filling pressure. - Aortic valve: Valve area (Vmax): 2.69 cm^2. - Mitral valve: Calcified annulus. There was mild regurgitation. - Right ventricle: The cavity size was moderately dilated. Wall   thickness was normal. - Right atrium: The atrium was mildly dilated.    Recent Results (from the past 240 hour(s))  Culture, blood (routine x 2)     Status: None (Preliminary result)   Collection Time: 03/28/17  2:51 PM  Result Value Ref Range Status   Specimen Description BLOOD RIGHT HAND  Final   Special Requests IN PEDIATRIC BOTTLE Blood Culture adequate volume  Final   Culture   Final    NO GROWTH 4 DAYS Performed at [facility], [address], [place], [place] [address]    Report Status PENDING  Incomplete  Culture, blood (routine x 2)     Status: None (Preliminary result)   Collection Time:  03/28/17  2:57 PM  Result Value Ref Range Status   Specimen Description BLOOD RIGHT FOREARM  Final   Special Requests IN PEDIATRIC BOTTLE Blood Culture adequate volume  Final   Culture   Final    NO GROWTH 4 DAYS Performed at [facility], [place] [address] [place], [place] [address]    Report Status PENDING  Incomplete  Culture, Urine     Status: None   Collection Time: 03/29/17  4:08 AM  Result Value Ref Range Status   Specimen Description URINE, CLEAN CATCH  Final   Special Requests NONE  Final   Culture   Final    NO GROWTH Performed at [facility], [place] [address], [place],  [address]    Report Status 03/30/2017 FINAL  Final    BRIEF ADMITTING H & P: [name] is a 66 y.o. male with medical history significant of type 2 diabetes, GERD,  heart murmur, hypertension, pneumonia, chronic kidney disease stage IV, sickle cell anemia     Presented with back and left-sided chest pain worse with palpation. Otherwise no fevers or chills last admission was in August 2018 for sickle cell crisis. Patient is taking oxycodone at home but is up in the relief states he have had some nausea and vomiting nonproductive cough. abdominal distention with constipation with small BM today.  States today fell due to generalized weakness,  Regarding pertinent Chronic problems: CKD status post AV fistula placement in June 2016   Hospital Course:  Present on Admission: . Chronic kidney disease (CKD), stage IV (severe) ([place]) . Hypertension . Leukocytosis . (Resolved) Sickle cell pain crisis ([place]) . Type 2 diabetes mellitus with renal complication (HCC) . (Resolved) Sickle cell crisis ([place]) . (Resolved) Dehydration   1. AKI on CKD IV: Due to dehydrated state and urinary retention. Resolved after IVF hydration and placement of foley catheter. Currently BUN and Cr at baseline of 4.5 - 5.  2. Acute Urinary Retention with Urinary Obstruction: Due to dehydrated state, patient  initially was oliguric. However after hydration he was noted to have a post-void residual of >400 and foley catheter was placed with immediate void of 800 ml of urine. U/A was negative for elements consistent with a UTI.  Pt was started on FLomax 0.4 mg daily. I spoke with Dr. Alyson Ingles from Urology  Who advised that foley should stay in place and patient is to follow up with Urology 1 week after discharge at which time there would be further evaluation of the urinary obstruction.  3. Anemia of Chronic Disease: Pt had a Hb of 6.6 g/dl on admission with a reticulocyte count of 6.7% which is inadequate for the degree of anemia. He was transfused 2 units RBC's and at the time of discharge his Hb was 7 g/dL. He may benefit from ESA however this was not administered as the patient had already received the transfusions and Hematocrit was increased at 23.4%. I will defer to Nephrology to determine need for ESA.  4. Hb SS with Crisis: Pt had pain mostly  localized to the Left chest wall. He was treated initially treated with Dilaudid via PCA and transitioned to Oxycodone. As the pain improved the Dilaudid was weaned off. He was unable to receive Toradol due to AKI. With the pain being in the Left chest wall , there was a suspicion of cardiac cause given his co-morbidities. However all troponin were negative and EKG showed only a LBBB which was unchanged from previous EKG's.  5. HTN: Pt has essential HTN, and HTN secondary to CKD. His BP was elevated throughout hospitalization and he was continued on Amlodipine and Carvedilol. Patient had been taking Lasix 80 mg on a chronic basis but this was held due to volume depleted state. It is resumed at 40 mg on a PRN swelling or increase in weight basis.  6. Chronic Hyperkalemia: Pt has a chronic mild hyperkalemia due to decreased renal excretion. At the time of discharge his potassium levels were mildly elevated at 5.4. He was given a dose of Kayexalate and is discharged on  Kayexalate 2 x week on Monday and Friday. He should have his Potassium levels checked next week under direction of Nephrology (Dr. Jonnie Finner will arrange with office).  7. DM II with renal complications: Pt's blood sugars were well controlled during hospitalization he is discharged home on pre-admission medications without any change. 8. Chronic Leukocytosis: Pt has a chronic leukocytosis with  baseline WBC's elevated at 15-17. He initially had a markedly elevated WBC as high as 22.4 and was empirically started on Zosyn. However the antibiotics were discontinue without any focus or signs of infection. It was felt to be due to a hemo-concentrated stated associated with dehydration. The leukocytosis resolved with hydration back to baseline.   Disposition and Follow-up: Pt discharged home with [facility] PT and RN.    DISCHARGE EXAM:  General: Alert, awake, oriented x3, in no apparent distress. Well appearing.(wife at bedside) HEENT: [place]/AT PEERL, EOMI, anicteric Neck: Trachea midline, no masses, no thyromegal,y no JVD, no carotid bruit OROPHARYNX: Moist, No exudate/ erythema/lesions.  Heart: Regular rate and rhythm, II/VI systolic ejection murmur at base.  No rubs, gallops or S3. PMI non-displaced. Exam reveals no decreased pulses. Pulmonary/Chest: Normal effort. Breath sounds normal. No. Apnea. Clear to auscultation,no stridor,  no wheezing and no rhonchi noted. No respiratory distress and no tenderness noted. Abdomen: Soft, nontender, nondistended, normal bowel sounds, no masses no hepatosplenomegaly noted. No fluid wave and no ascites. There is no guarding or rebound. Neuro: Alert and oriented to person, place and time. Normal motor skills, Displays no atrophy or tremors and exhibits normal muscle tone.  No focal neurological deficits noted cranial nerves II through XII grossly intact. No sensory deficit noted. Strength at baseline in bilateral upper and lower extremities. Gait wide based with  unsteadiness.. Musculoskeletal: No warmth swelling or erythema around joints, no spinal tenderness noted. Psychiatric: Patient alert and oriented x3, good insight and cognition, good recent to remote recall. Skin: Skin is warm and dry. No bruising, no ecchymosis and no rash noted. Pt is not diaphoretic. No erythema. No pallor    Blood pressure (!) 156/76, pulse 78, temperature 98.5 F (36.9 C), temperature source Axillary, resp. rate 16, height 6\' 1"  (1.854 m), weight 80.6 kg (177 lb 11.1 oz), SpO2 92 %.   Recent Labs  04/01/17 0842 04/02/17 0451  NA 136 140  K 5.2* 5.4*  CL 106 108  CO2 21* 22  GLUCOSE 158* 93  BUN 70* 60*  CREATININE 4.43* 3.94*  CALCIUM 8.9 9.3    Recent Labs  04/01/17 0842 04/02/17 0451  AST 17 17  ALT 12* 11*  ALKPHOS 107 104  BILITOT 2.7* 2.7*  PROT 6.9 7.2  ALBUMIN 2.8* 2.7*   No results for input(s): LIPASE, AMYLASE in the last 72 hours.  Recent Labs  04/01/17 0842 04/02/17 0451  WBC 16.5* 16.7*  NEUTROABS 11.1* 9.8*  HGB 6.4* 7.0*  HCT 19.9* 21.2*  MCV 71.8* 72.4*  PLT 243 264     Total time spent including face to face and decision making was greater than 30 minutes  Signed: Kerrington Sova A. 04/02/2017, 12:42 PM

## 2017-04-29 ENCOUNTER — Other Ambulatory Visit: Payer: Self-pay | Admitting: Internal Medicine

## 2017-05-07 ENCOUNTER — Ambulatory Visit (HOSPITAL_COMMUNITY)
Admission: EM | Admit: 2017-05-07 | Discharge: 2017-05-07 | Disposition: A | Discharge status: Home / Self Care | Payer: Medicare HMO | Attending: Emergency Medicine | Admitting: Emergency Medicine

## 2017-05-07 ENCOUNTER — Encounter (HOSPITAL_COMMUNITY): Payer: Self-pay | Admitting: Emergency Medicine

## 2017-05-07 ENCOUNTER — Other Ambulatory Visit: Payer: Self-pay

## 2017-05-07 DIAGNOSIS — R05 Cough: Secondary | ICD-10-CM

## 2017-05-07 DIAGNOSIS — J189 Pneumonia, unspecified organism: Secondary | ICD-10-CM

## 2017-05-07 DIAGNOSIS — R0602 Shortness of breath: Secondary | ICD-10-CM | POA: Diagnosis not present

## 2017-05-07 MED ORDER — CEFTRIAXONE SODIUM 1 G IJ SOLR
1.0000 g | Freq: Once | INTRAMUSCULAR | Status: AC
  Administered 2017-05-07: 1 g via INTRAMUSCULAR

## 2017-05-07 MED ORDER — CEFTRIAXONE SODIUM 1 G IJ SOLR
INTRAMUSCULAR | Status: AC
  Filled 2017-05-07: qty 10

## 2017-05-07 MED ORDER — LEVOFLOXACIN 250 MG PO TABS: ORAL_TABLET | ORAL | 0 refills | Status: AC

## 2017-05-07 MED ORDER — LIDOCAINE HCL (PF) 1 % IJ SOLN
INTRAMUSCULAR | Status: AC
  Filled 2017-05-07: qty 2

## 2017-05-07 NOTE — Discharge Instructions (Signed)
Take 750mg  of antibiotic tonight; take 500mg  every other day for 5 doses. Please follow up with your primary care provider in two days for recheck. If becoming more weak, fatigued, lethargic, increased pain or shortness of breath please go to ER

## 2017-05-07 NOTE — ED Triage Notes (Signed)
Pt c/o flu like symptoms, cough, SOB.

## 2017-05-07 NOTE — ED Provider Notes (Signed)
[place]    CSN: [id number] Arrival date & time: 05/07/17  1823     History   Chief Complaint Chief Complaint  Patient presents with  . Cough  . Shortness of Breath    HPI [name] is a 66 y.o. male.   Arch presents with family with complaints of worsening cough, shortness of breath and fevers which started 2-4 days ago. Took tylenol earlier today which did seem to help some. Otherwise has not taken any medications. States he has chest pain with his cough. Without new lower extremity edema. Denies asthma history. Was recently hospitalized. Does not smoke. Denies sore throat or ear pain. Denies gi/gu complaints. Decreased appetite.   ROS per HPI.       Past Medical History:  Diagnosis Date  . Diabetes mellitus   . GERD (gastroesophageal reflux disease)   . Heart murmur   . Hypertension   . Pneumonia   . Renal insufficiency   . Sickle cell anemia (HCC)    Hgb SS disease     Patient Active Problem List   Diagnosis Date Noted  . Acute urinary obstruction 03/29/2017  . Microcytic anemia   . Heart murmur 03/27/2016  . Chronic kidney disease (CKD), stage IV (severe) ([place]) 03/26/2016  . Hb-SS disease without crisis ([place])   . Hyperkalemia, diminished renal excretion 09/03/2014  . Leukocytosis 07/28/2011  . LBBB (left bundle branch block) 07/28/2011  . Hypertension   . Type 2 diabetes mellitus with renal complication [facility])     Past Surgical History:  Procedure Laterality Date  . AV FISTULA PLACEMENT Left 11/22/2016   Procedure: INSERTION OF ARTERIOVENOUS (AV) GORE-TEX GRAFT  LEFT FOREARM USING 6MM X 50CM GORETEX GRAFT;  Surgeon: Serafina Mitchell, MD;  Location: [facility];  Service: Vascular;  Laterality: Left;  . CATARACT EXTRACTION W/ INTRAOCULAR LENS  IMPLANT, BILATERAL    . CHOLECYSTECTOMY    . COLON SURGERY     Colon cancer surgery, removed small amt not full colectomy  . COLONOSCOPY     nclear who follows him for this  . LEG SURGERY     For ? gangrene after running into barbwire fence at 66yo       Home Medications    Prior to Admission medications   Medication Sig Start Date End Date Taking? Authorizing Provider  acetaminophen (TYLENOL) 500 MG tablet Take 500 mg by mouth every 6 (six) hours as needed for headache (pain).    [provider]  amLODipine (NORVASC) 10 MG tablet Take 1 tablet (10 mg total) by mouth daily. 06/07/16   Leana Gamer, MD  carvedilol (COREG) 12.5 MG tablet Take 12.5 mg by mouth 2 (two) times daily. 09/18/16   [provider]  Docusate Calcium (STOOL SOFTENER PO) Take 1 capsule by mouth daily as needed (constipation).    [provider]  folic acid (FOLVITE) 1 MG tablet Take 1 tablet (1 mg total) by mouth daily. 04/18/13   Robbie Lis, MD  furosemide (LASIX) 80 MG tablet Take 0.5 tablets (40 mg total) by mouth daily as needed for edema (Weight more than 2 pounds above 177 pounds). 04/02/17   Leana Gamer, MD  gabapentin (NEURONTIN) 100 MG capsule Take 1 capsule (100 mg total) by mouth 3 (three) times daily. Patient taking differently: Take 300 mg by mouth at bedtime.  12/29/16   Mariel Aloe, MD  insulin aspart (NOVOLOG FLEXPEN) 100 UNIT/ML FlexPen Inject 2-6 Units into the skin  3 (three) times daily as needed for high blood sugar (CBG >150).    [provider]  insulin glargine (LANTUS) 100 unit/mL SOPN Inject 20 Units into the skin at bedtime.     [provider]  levofloxacin (LEVAQUIN) 250 MG tablet Take 3 tablets (750 mg total) by mouth daily for 1 day, THEN 2 tablets (500 mg total) every other day for 5 days. 05/07/17 05/13/17  Zigmund Gottron, NP  omeprazole (PRILOSEC) 20 MG capsule Take 20 mg by mouth daily.     [provider]  oxyCODONE (ROXICODONE) 5 MG immediate release tablet Take 1 tablet (5 mg total) by mouth every 6 (six) hours as needed. Patient taking differently: Take 5 mg by mouth every 6 (six) hours as needed for  moderate pain.  11/22/16   Rhyne, Hulen Shouts, PA-C  polyethylene glycol powder (MIRALAX) powder Take 17 g by mouth daily. Patient taking differently: Take 17 g by mouth daily as needed (constipation). Mix in 8 oz liquid and drink 10/28/16   Palumbo, April, MD  saxagliptin HCl (ONGLYZA) 5 MG TABS tablet Take 0.5 tablets (2.5 mg total) by mouth daily. 12/29/16   Mariel Aloe, MD  Sennosides (SENNA LAXATIVE PO) Take 1 tablet by mouth daily as needed (constipation).    [provider]  sodium polystyrene (KAYEXALATE) 15 GM/60ML suspension 30 grams 2 x week on Monday and Friday. Next dose due on Friday. 04/02/17   Leana Gamer, MD  tamsulosin (FLOMAX) 0.4 MG CAPS capsule Take 1 capsule (0.4 mg total) by mouth daily. 04/03/17   Leana Gamer, MD    Family History Family History  Problem Relation Age of Onset  . Venous thrombosis Mother        Dec. of blood clot   . Diabetes type II Mother   . AAA (abdominal aortic aneurysm) Mother   . Throat cancer Father        Deceased of throat cancer    Social History Social History   Tobacco Use  . Smoking status: Former Smoker    Packs/day: 0.50    Years: 20.00    Pack years: 10.00    Last attempt to quit: 06/05/1966    Years since quitting: 50.9  . Smokeless tobacco: Never Used  Substance Use Topics  . Alcohol use: Yes    Comment: rare beer  . Drug use: Yes    Types: Cocaine    Comment: last used crack cocaine  November 2017     Allergies   Morphine and related   Review of Systems Review of Systems   Physical Exam Triage Vital Signs ED Triage Vitals [05/07/17 1918]  Enc Vitals Group     BP (!) 136/57     Pulse Rate 81     Resp (!) 24     Temp 99.5 F (37.5 C)     Temp src      SpO2 94 %     Weight      Height      Head Circumference      Peak Flow      Pain Score      Pain Loc      Pain Edu?      Excl. in [place]?    No data found.  Updated Vital Signs BP (!) 136/57   Pulse 81   Temp 99.5 F  (37.5 C)   Resp (!) 24   SpO2 94%   Visual Acuity Right Eye Distance:  Left Eye Distance:   Bilateral Distance:    Right Eye Near:   Left Eye Near:    Bilateral Near:     Physical Exam  Constitutional: He is oriented to person, place, and time. He appears well-developed and well-nourished.  HENT:  Head: Normocephalic and atraumatic.  Right Ear: Tympanic membrane, external ear and ear canal normal.  Left Ear: Tympanic membrane, external ear and ear canal normal.  Nose: Nose normal. Right sinus exhibits no maxillary sinus tenderness and no frontal sinus tenderness. Left sinus exhibits no maxillary sinus tenderness and no frontal sinus tenderness.  Mouth/Throat: Uvula is midline, oropharynx is clear and moist and mucous membranes are normal.  Eyes: Conjunctivae are normal. Pupils are equal, round, and reactive to light.  Neck: Normal range of motion.  Cardiovascular: Normal rate and regular rhythm.  Pulmonary/Chest: Effort normal. He has rhonchi in the left lower field.  Lymphadenopathy:    He has no cervical adenopathy.  Neurological: He is alert and oriented to person, place, and time.  Skin: Skin is warm and dry.  Vitals reviewed.    UC Treatments / Results  Labs (all labs ordered are listed, but only abnormal results are displayed) Labs Reviewed - No data to display  EKG  EKG Interpretation None       Radiology No results found.  Procedures Procedures (including critical care time)  Medications Ordered in UC Medications - No data to display   Initial Impression / Assessment and Plan / UC Course  I have reviewed the triage vital signs and the nursing notes.  Pertinent labs & imaging results that were available during my care of the patient were reviewed by me and considered in my medical decision making (see chart for details).     Concern for pneumonia with extensive medical history including kidney disease and sickle cell. Rocephin tonight, followed  by course of adjusted dosing levaquin. Follow up with PCP in the next two days for recheck of symptoms. Ensure adequate hydration. Tylenol as needed for fevers. Discussed at length with patient and family signs to return to the ed sooner. Patient verbalized understanding and agreeable to plan.    Final Clinical Impressions(s) / UC Diagnoses   Final diagnoses:  Community acquired pneumonia, unspecified laterality    ED Discharge Orders        Ordered    levofloxacin (LEVAQUIN) 250 MG tablet     05/07/17 2033       Controlled Substance Prescriptions  Controlled Substance Registry consulted? Not Applicable   Zigmund Gottron, NP 05/07/17 2042

## 2017-06-05 DIAGNOSIS — J189 Pneumonia, unspecified organism: Secondary | ICD-10-CM

## 2017-06-05 HISTORY — DX: Pneumonia, unspecified organism: J18.9

## 2017-11-13 ENCOUNTER — Emergency Department (HOSPITAL_COMMUNITY): Payer: Medicare HMO

## 2017-11-13 ENCOUNTER — Encounter (HOSPITAL_COMMUNITY): Payer: Self-pay | Admitting: Emergency Medicine

## 2017-11-13 ENCOUNTER — Inpatient Hospital Stay (HOSPITAL_COMMUNITY)
Admission: EM | Admit: 2017-11-13 | Discharge: 2017-11-16 | DRG: 917 | Disposition: A | Discharge status: Home / Self Care | Payer: Medicare HMO | Attending: Internal Medicine | Admitting: Internal Medicine

## 2017-11-13 DIAGNOSIS — Z801 Family history of malignant neoplasm of trachea, bronchus and lung: Secondary | ICD-10-CM

## 2017-11-13 DIAGNOSIS — E1129 Type 2 diabetes mellitus with other diabetic kidney complication: Secondary | ICD-10-CM | POA: Diagnosis present

## 2017-11-13 DIAGNOSIS — D5700 Hb-SS disease with crisis, unspecified: Secondary | ICD-10-CM | POA: Diagnosis present

## 2017-11-13 DIAGNOSIS — Z9049 Acquired absence of other specified parts of digestive tract: Secondary | ICD-10-CM | POA: Diagnosis not present

## 2017-11-13 DIAGNOSIS — Z9842 Cataract extraction status, left eye: Secondary | ICD-10-CM | POA: Diagnosis not present

## 2017-11-13 DIAGNOSIS — Z9841 Cataract extraction status, right eye: Secondary | ICD-10-CM | POA: Diagnosis not present

## 2017-11-13 DIAGNOSIS — T40601A Poisoning by unspecified narcotics, accidental (unintentional), initial encounter: Secondary | ICD-10-CM | POA: Diagnosis present

## 2017-11-13 DIAGNOSIS — I120 Hypertensive chronic kidney disease with stage 5 chronic kidney disease or end stage renal disease: Secondary | ICD-10-CM | POA: Diagnosis present

## 2017-11-13 DIAGNOSIS — Z833 Family history of diabetes mellitus: Secondary | ICD-10-CM | POA: Diagnosis not present

## 2017-11-13 DIAGNOSIS — Z87891 Personal history of nicotine dependence: Secondary | ICD-10-CM | POA: Diagnosis not present

## 2017-11-13 DIAGNOSIS — Z885 Allergy status to narcotic agent status: Secondary | ICD-10-CM

## 2017-11-13 DIAGNOSIS — E875 Hyperkalemia: Secondary | ICD-10-CM | POA: Diagnosis present

## 2017-11-13 DIAGNOSIS — E1121 Type 2 diabetes mellitus with diabetic nephropathy: Secondary | ICD-10-CM | POA: Diagnosis not present

## 2017-11-13 DIAGNOSIS — K219 Gastro-esophageal reflux disease without esophagitis: Secondary | ICD-10-CM | POA: Diagnosis present

## 2017-11-13 DIAGNOSIS — Z85038 Personal history of other malignant neoplasm of large intestine: Secondary | ICD-10-CM

## 2017-11-13 DIAGNOSIS — R011 Cardiac murmur, unspecified: Secondary | ICD-10-CM | POA: Diagnosis present

## 2017-11-13 DIAGNOSIS — Z794 Long term (current) use of insulin: Secondary | ICD-10-CM

## 2017-11-13 DIAGNOSIS — R4182 Altered mental status, unspecified: Secondary | ICD-10-CM | POA: Diagnosis present

## 2017-11-13 DIAGNOSIS — N185 Chronic kidney disease, stage 5: Secondary | ICD-10-CM | POA: Diagnosis present

## 2017-11-13 DIAGNOSIS — I10 Essential (primary) hypertension: Secondary | ICD-10-CM | POA: Diagnosis not present

## 2017-11-13 DIAGNOSIS — I447 Left bundle-branch block, unspecified: Secondary | ICD-10-CM | POA: Diagnosis present

## 2017-11-13 DIAGNOSIS — E1122 Type 2 diabetes mellitus with diabetic chronic kidney disease: Secondary | ICD-10-CM | POA: Diagnosis present

## 2017-11-13 DIAGNOSIS — Z961 Presence of intraocular lens: Secondary | ICD-10-CM | POA: Diagnosis present

## 2017-11-13 DIAGNOSIS — D649 Anemia, unspecified: Secondary | ICD-10-CM

## 2017-11-13 LAB — URINALYSIS, ROUTINE W REFLEX MICROSCOPIC
Bacteria, UA: NONE SEEN
Bilirubin Urine: NEGATIVE
GLUCOSE, UA: NEGATIVE mg/dL
HGB URINE DIPSTICK: NEGATIVE
KETONES UR: NEGATIVE mg/dL
Leukocytes, UA: NEGATIVE
NITRITE: NEGATIVE
PROTEIN: 30 mg/dL — AB
Specific Gravity, Urine: 1.012 (ref 1.005–1.030)
pH: 5 (ref 5.0–8.0)

## 2017-11-13 LAB — COMPREHENSIVE METABOLIC PANEL
ALK PHOS: 88 U/L (ref 38–126)
ALT: 13 U/L — AB (ref 17–63)
AST: 26 U/L (ref 15–41)
Albumin: 4.3 g/dL (ref 3.5–5.0)
Anion gap: 6 (ref 5–15)
BUN: 47 mg/dL — ABNORMAL HIGH (ref 6–20)
CALCIUM: 9.1 mg/dL (ref 8.9–10.3)
CHLORIDE: 105 mmol/L (ref 101–111)
CO2: 24 mmol/L (ref 22–32)
CREATININE: 4.09 mg/dL — AB (ref 0.61–1.24)
GFR, EST AFRICAN AMERICAN: 16 mL/min — AB (ref >60)
GFR, EST NON AFRICAN AMERICAN: 14 mL/min — AB (ref >60)
Glucose, Bld: 191 mg/dL — ABNORMAL HIGH (ref 65–99)
Potassium: 5.9 mmol/L — ABNORMAL HIGH (ref 3.5–5.1)
Sodium: 135 mmol/L (ref 135–145)
TOTAL PROTEIN: 8 g/dL (ref 6.5–8.1)
Total Bilirubin: 1.9 mg/dL — ABNORMAL HIGH (ref 0.3–1.2)

## 2017-11-13 LAB — CBC
HCT: 18.8 % — ABNORMAL LOW (ref 39.0–52.0)
Hemoglobin: 6 g/dL — CL (ref 13.0–17.0)
MCH: 21.8 pg — ABNORMAL LOW (ref 26.0–34.0)
MCHC: 31.9 g/dL (ref 30.0–36.0)
MCV: 68.4 fL — ABNORMAL LOW (ref 78.0–100.0)
PLATELETS: 319 10*3/uL (ref 150–400)
RBC: 2.75 MIL/uL — AB (ref 4.22–5.81)
RDW: 17.7 % — ABNORMAL HIGH (ref 11.5–15.5)
WBC: 19.5 10*3/uL — AB (ref 4.0–10.5)

## 2017-11-13 LAB — LIPASE, BLOOD: LIPASE: 31 U/L (ref 11–51)

## 2017-11-13 LAB — RAPID URINE DRUG SCREEN, HOSP PERFORMED
Amphetamines: NOT DETECTED
BARBITURATES: NOT DETECTED
Benzodiazepines: NOT DETECTED
Cocaine: NOT DETECTED
Opiates: NOT DETECTED
Tetrahydrocannabinol: NOT DETECTED

## 2017-11-13 LAB — PREPARE RBC (CROSSMATCH)

## 2017-11-13 LAB — CBG MONITORING, ED: GLUCOSE-CAPILLARY: 184 mg/dL — AB (ref 65–99)

## 2017-11-13 LAB — RETICULOCYTES
RBC.: 2.74 MIL/uL — ABNORMAL LOW (ref 4.22–5.81)
RETIC COUNT ABSOLUTE: 213.7 10*3/uL — AB (ref 19.0–186.0)
Retic Ct Pct: 7.8 % — ABNORMAL HIGH (ref 0.4–3.1)

## 2017-11-13 LAB — ETHANOL

## 2017-11-13 MED ORDER — HEPARIN SODIUM (PORCINE) 5000 UNIT/ML IJ SOLN
5000.0000 [IU] | Freq: Three times a day (TID) | INTRAMUSCULAR | Status: DC
  Administered 2017-11-14 – 2017-11-16 (×8): 5000 [IU] via SUBCUTANEOUS
  Filled 2017-11-13 (×8): qty 1

## 2017-11-13 MED ORDER — NALOXONE HCL 0.4 MG/ML IJ SOLN: 0.4000 mg | INTRAMUSCULAR | Status: DC | PRN

## 2017-11-13 MED ORDER — GABAPENTIN 300 MG PO CAPS
300.0000 mg | ORAL_CAPSULE | Freq: Every day | ORAL | Status: DC
  Administered 2017-11-14 – 2017-11-15 (×3): 300 mg via ORAL
  Filled 2017-11-13 (×3): qty 1

## 2017-11-13 MED ORDER — ACETAMINOPHEN 650 MG RE SUPP: 650.0000 mg | Freq: Four times a day (QID) | RECTAL | Status: DC | PRN

## 2017-11-13 MED ORDER — CARVEDILOL 25 MG PO TABS
25.0000 mg | ORAL_TABLET | Freq: Two times a day (BID) | ORAL | Status: DC
  Administered 2017-11-14 – 2017-11-16 (×6): 25 mg via ORAL
  Filled 2017-11-13 (×6): qty 1

## 2017-11-13 MED ORDER — INSULIN GLARGINE 100 UNIT/ML ~~LOC~~ SOLN
10.0000 [IU] | Freq: Every day | SUBCUTANEOUS | Status: DC
  Administered 2017-11-14 (×2): 10 [IU] via SUBCUTANEOUS
  Filled 2017-11-13 (×4): qty 0.1

## 2017-11-13 MED ORDER — ONDANSETRON HCL 4 MG PO TABS: 4.0000 mg | ORAL_TABLET | Freq: Four times a day (QID) | ORAL | Status: DC | PRN

## 2017-11-13 MED ORDER — TAMSULOSIN HCL 0.4 MG PO CAPS
0.4000 mg | ORAL_CAPSULE | Freq: Every day | ORAL | Status: DC
  Administered 2017-11-14 – 2017-11-16 (×3): 0.4 mg via ORAL
  Filled 2017-11-13 (×3): qty 1

## 2017-11-13 MED ORDER — NALOXONE HCL 0.4 MG/ML IJ SOLN
0.2000 mg | Freq: Once | INTRAMUSCULAR | Status: AC
  Administered 2017-11-13: 0.2 mg via INTRAVENOUS
  Filled 2017-11-13: qty 1

## 2017-11-13 MED ORDER — INSULIN ASPART 100 UNIT/ML ~~LOC~~ SOLN
0.0000 [IU] | Freq: Three times a day (TID) | SUBCUTANEOUS | Status: DC
  Administered 2017-11-14 – 2017-11-16 (×3): 1 [IU] via SUBCUTANEOUS

## 2017-11-13 MED ORDER — DOCUSATE SODIUM 100 MG PO CAPS: 100.0000 mg | ORAL_CAPSULE | Freq: Every day | ORAL | Status: DC | PRN

## 2017-11-13 MED ORDER — ALBUTEROL SULFATE (2.5 MG/3ML) 0.083% IN NEBU: 3.0000 mL | INHALATION_SOLUTION | Freq: Four times a day (QID) | RESPIRATORY_TRACT | Status: DC | PRN

## 2017-11-13 MED ORDER — PANTOPRAZOLE SODIUM 40 MG PO TBEC
40.0000 mg | DELAYED_RELEASE_TABLET | Freq: Every day | ORAL | Status: DC
  Administered 2017-11-14 – 2017-11-16 (×3): 40 mg via ORAL
  Filled 2017-11-13 (×3): qty 1

## 2017-11-13 MED ORDER — SENNA 8.6 MG PO TABS: 1.0000 | ORAL_TABLET | Freq: Every day | ORAL | Status: DC | PRN

## 2017-11-13 MED ORDER — ACETAMINOPHEN 325 MG PO TABS
650.0000 mg | ORAL_TABLET | Freq: Four times a day (QID) | ORAL | Status: DC | PRN
  Administered 2017-11-14: 650 mg via ORAL
  Filled 2017-11-13 (×2): qty 2

## 2017-11-13 MED ORDER — AMLODIPINE BESYLATE 5 MG PO TABS
5.0000 mg | ORAL_TABLET | Freq: Every day | ORAL | Status: DC
  Administered 2017-11-14 – 2017-11-16 (×3): 5 mg via ORAL
  Filled 2017-11-13 (×3): qty 1

## 2017-11-13 MED ORDER — FOLIC ACID 1 MG PO TABS
1.0000 mg | ORAL_TABLET | Freq: Every day | ORAL | Status: DC
  Administered 2017-11-14 – 2017-11-16 (×3): 1 mg via ORAL
  Filled 2017-11-13 (×3): qty 1

## 2017-11-13 MED ORDER — SODIUM CHLORIDE 0.9 % IV SOLN
10.0000 mL/h | Freq: Once | INTRAVENOUS | Status: AC
  Administered 2017-11-14: 10 mL/h via INTRAVENOUS

## 2017-11-13 MED ORDER — SODIUM POLYSTYRENE SULFONATE 15 GM/60ML PO SUSP
30.0000 g | Freq: Once | ORAL | Status: AC
  Administered 2017-11-14: 30 g via ORAL
  Filled 2017-11-13: qty 120

## 2017-11-13 MED ORDER — ONDANSETRON HCL 4 MG/2ML IJ SOLN: 4.0000 mg | Freq: Four times a day (QID) | INTRAMUSCULAR | Status: DC | PRN

## 2017-11-13 NOTE — ED Notes (Signed)
Patient grunting after narcan administration. C/o right arm pain and nausea. Dr. Lita Mains at bedside.

## 2017-11-13 NOTE — ED Notes (Signed)
Family at bedside requesting update from MD. MD made aware.

## 2017-11-13 NOTE — ED Notes (Signed)
Hospitalist at bedside 

## 2017-11-13 NOTE — H&P (Addendum)
History and Physical    PINK MAYE XTG:[id number] DOB: Jan 22, 1951 DOA: 11/13/2017  PCP: Nolene Ebbs, MD  Patient coming from: Home  I have personally briefly reviewed patient's old medical records in [name]  Chief Complaint: AMS  HPI: [name] is a 67 y.o. male with medical history significant of HGB-SS disease, DM2, HTN, CKD stage 5 with fistula in place since 2016 but not on dialysis yet, takes chronic bi-weekly kayexelate for chronic hyperkalemia instead it seems.  Patient presents to the ED with AMS.  Patient took hydrocodone and Xanax for sickle cell pain crisis PTA.  Family brought him in to ED due to AMS.  They state patient is drowsy, slow to respond, difficult to wake up.   ED Course: UDS neg.  Patient was given a 0.2mg  dose of narcan to check for possible opiate overdose as cause of AMS with positive result.  That is, after being given the narcan, patient became more alert, mental status improved (although patient did become more painful).  Since that time narcan has worn off and patient is back to being drowsy although he can wake up to voice and answer questions.  Maintaining airway, etc.   Review of Systems: As per HPI otherwise 10 point review of systems negative.   Past Medical History:  Diagnosis Date  . Diabetes mellitus   . GERD (gastroesophageal reflux disease)   . Heart murmur   . Hypertension   . Pneumonia   . Renal insufficiency   . Sickle cell anemia (HCC)    Hgb SS disease     Past Surgical History:  Procedure Laterality Date  . AV FISTULA PLACEMENT Left 11/22/2016   Procedure: INSERTION OF ARTERIOVENOUS (AV) GORE-TEX GRAFT  LEFT FOREARM USING 6MM X 50CM GORETEX GRAFT;  Surgeon: Serafina Mitchell, MD;  Location: [place];  Service: Vascular;  Laterality: Left;  . CATARACT EXTRACTION W/ INTRAOCULAR LENS  IMPLANT, BILATERAL    . CHOLECYSTECTOMY    . COLON SURGERY     Colon cancer surgery, removed small amt not full colectomy  .  COLONOSCOPY     nclear who follows him for this  . LEG SURGERY     For ? gangrene after running into barbwire fence at 67yo     reports that he quit smoking about 51 years ago. He has a 10.00 pack-year smoking history. He has never used smokeless tobacco. He reports that he drinks alcohol. He reports that he has current or past drug history. Drug: Cocaine.  Allergies  Allergen Reactions  . Morphine And Related Itching    Family History  Problem Relation Age of Onset  . Venous thrombosis Mother        Dec. of blood clot   . Diabetes type II Mother   . AAA (abdominal aortic aneurysm) Mother   . Throat cancer Father        Deceased of throat cancer     Prior to Admission medications   Medication Sig Start Date End Date Taking? Authorizing Provider  acetaminophen (TYLENOL) 500 MG tablet Take 500 mg by mouth every 6 (six) hours as needed for headache (pain).   Yes [provider]  amLODipine (NORVASC) 5 MG tablet Take 5 mg by mouth daily. 10/04/17  Yes [provider]  carvedilol (COREG) 25 MG tablet Take 25 mg by mouth 2 (two) times daily. 09/04/17  Yes [provider]  folic acid (FOLVITE) 1 MG tablet Take 1 tablet (1 mg total)  by mouth daily. 04/18/13  Yes Robbie Lis, MD  gabapentin (NEURONTIN) 300 MG capsule TAKE 1 (ONE) CAPSULE AT BEDTIME 10/25/17  Yes [provider]  glipiZIDE (GLUCOTROL XL) 10 MG 24 hr tablet Take 10 mg by mouth 2 (two) times daily. 10/22/17  Yes [provider]  insulin aspart (NOVOLOG FLEXPEN) 100 UNIT/ML FlexPen Inject 2-6 Units into the skin 3 (three) times daily as needed for high blood sugar (CBG >150).   Yes [provider]  insulin glargine (LANTUS) 100 unit/mL SOPN Inject 20 Units into the skin at bedtime.    Yes [provider]  omeprazole (PRILOSEC) 20 MG capsule Take 20 mg by mouth daily.    Yes [provider]  saxagliptin HCl (ONGLYZA) 5 MG TABS tablet Take 0.5 tablets (2.5 mg  total) by mouth daily. 12/29/16  Yes Mariel Aloe, MD  tamsulosin (FLOMAX) 0.4 MG CAPS capsule Take 1 capsule (0.4 mg total) by mouth daily. 04/03/17  Yes Leana Gamer, MD  VENTOLIN HFA 108 (90 Base) MCG/ACT inhaler INHALE 2 PUFFS BY MOUTH 4 TIMES A DAY AS NEEDED FOR SOB AND WHEEZING 09/03/17  Yes [provider]  Docusate Calcium (STOOL SOFTENER PO) Take 1 capsule by mouth daily as needed (constipation).    [provider]  furosemide (LASIX) 80 MG tablet Take 0.5 tablets (40 mg total) by mouth daily as needed for edema (Weight more than 2 pounds above 177 pounds). 04/02/17   Leana Gamer, MD  polyethylene glycol powder (MIRALAX) powder Take 17 g by mouth daily. Patient taking differently: Take 17 g by mouth daily as needed (constipation). Mix in 8 oz liquid and drink 10/28/16   Palumbo, April, MD  Sennosides (SENNA LAXATIVE PO) Take 1 tablet by mouth daily as needed (constipation).    [provider]  sodium polystyrene (KAYEXALATE) 15 GM/60ML suspension 30 grams 2 x week on Monday and Friday. Next dose due on Friday. Patient taking differently: Take 15 g by mouth once.  04/02/17   Leana Gamer, MD  tiZANidine (ZANAFLEX) 4 MG tablet Take 4 mg by mouth 2 (two) times daily as needed for muscle spasms.  09/24/17   [provider]    Physical Exam: Vitals:   11/13/17 2105 11/13/17 2130 11/13/17 2200 11/13/17 2230  BP: (!) 156/70 (!) 165/66 (!) 162/81 (!) 160/68  Pulse: 84 77 76 74  Resp: 16 20 15 14   Temp:      TempSrc:      SpO2: 97% 100% 100% 100%    Constitutional: NAD, calm, comfortable, sleepy but arrousable, slightly slow to respond Eyes: PERRL, lids and conjunctivae normal ENMT: Mucous membranes are moist. Posterior pharynx clear of any exudate or lesions.Normal dentition.  Neck: normal, supple, no masses, no thyromegaly Respiratory: clear to auscultation bilaterally, no wheezing, no crackles. Normal respiratory effort. No  accessory muscle use.  Cardiovascular: Regular rate and rhythm, no murmurs / rubs / gallops. No extremity edema. 2+ pedal pulses. No carotid bruits.  Abdomen: no tenderness, no masses palpated. No hepatosplenomegaly. Bowel sounds positive.  Musculoskeletal: no clubbing / cyanosis. No joint deformity upper and lower extremities. Good ROM, no contractures. Normal muscle tone.  Skin: no rashes, lesions, ulcers. No induration Neurologic: CN 2-12 grossly intact. Sensation intact, DTR normal. Strength 5/5 in all 4.  Psychiatric: Normal judgment and insight. Alert and oriented x 3. Normal mood.    Labs on Admission: I have personally reviewed following labs and imaging studies  CBC: Recent  Labs  Lab 11/13/17 1621  WBC 19.5*  HGB 6.0*  HCT 18.8*  MCV 68.4*  PLT 937   Basic Metabolic Panel: Recent Labs  Lab 11/13/17 1621  NA 135  K 5.9*  CL 105  CO2 24  GLUCOSE 191*  BUN 47*  CREATININE 4.09*  CALCIUM 9.1   GFR: CrCl cannot be calculated (Unknown ideal weight.). Liver Function Tests: Recent Labs  Lab 11/13/17 1621  AST 26  ALT 13*  ALKPHOS 88  BILITOT 1.9*  PROT 8.0  ALBUMIN 4.3   Recent Labs  Lab 11/13/17 1621  LIPASE 31   No results for input(s): AMMONIA in the last 168 hours. Coagulation Profile: No results for input(s): INR, PROTIME in the last 168 hours. Cardiac Enzymes: No results for input(s): CKTOTAL, CKMB, CKMBINDEX, TROPONINI in the last 168 hours. BNP (last 3 results) No results for input(s): PROBNP in the last 8760 hours. HbA1C: No results for input(s): HGBA1C in the last 72 hours. CBG: Recent Labs  Lab 11/13/17 1713  GLUCAP 184*   Lipid Profile: No results for input(s): CHOL, HDL, LDLCALC, TRIG, CHOLHDL, LDLDIRECT in the last 72 hours. Thyroid Function Tests: No results for input(s): TSH, T4TOTAL, FREET4, T3FREE, THYROIDAB in the last 72 hours. Anemia Panel: Recent Labs    11/13/17 2100  RETICCTPCT 7.8*   Urine analysis:    Component  Value Date/Time   COLORURINE YELLOW 11/13/2017 1826   APPEARANCEUR CLEAR 11/13/2017 1826   LABSPEC 1.012 11/13/2017 1826   PHURINE 5.0 11/13/2017 1826   GLUCOSEU NEGATIVE 11/13/2017 1826   HGBUR NEGATIVE 11/13/2017 1826   BILIRUBINUR NEGATIVE 11/13/2017 1826   KETONESUR NEGATIVE 11/13/2017 1826   PROTEINUR 30 (A) 11/13/2017 1826   UROBILINOGEN 1.0 09/06/2014 2203   NITRITE NEGATIVE 11/13/2017 1826   LEUKOCYTESUR NEGATIVE 11/13/2017 1826    Radiological Exams on Admission: Dg Abd Acute W/chest  Result Date: 11/13/2017 CLINICAL DATA:  Abdominal pain and distention for 2-3 days. History of sickle cell disease. EXAM: DG ABDOMEN ACUTE W/ 1V CHEST COMPARISON:  Chest x-ray 03/26/2017 and abdominal film 03/27/2017 FINDINGS: The semi upright AP chest x-ray demonstrates cardiac enlargement and vascular congestion without overt pulmonary edema or definite infiltrates. Bony changes of sickle cell disease noted. Two views of the abdomen demonstrate an unremarkable bowel gas pattern. No findings to suggest obstruction or perforation. The soft tissue shadows are grossly maintained. Small calcified spleen is noted in the left upper quadrant. Surgical clips in the right upper quadrant from a previous cholecystectomy. Extensive bony changes of sickle cell disease. IMPRESSION: No plain film findings for an acute abdominal process. Electronically Signed   By: Marijo Sanes M.D.   On: 11/13/2017 17:54    EKG: Independently reviewed.  Assessment/Plan Principal Problem:   Opiate overdose (HCC) Active Problems:   Hypertension   Type 2 diabetes mellitus with renal complication (HCC)   LBBB (left bundle branch block)   Hyperkalemia, diminished renal excretion   CKD (chronic kidney disease) stage 5, GFR less than 15 ml/min (HCC)   Hb-SS disease with crisis ([place])    1. Accidental Opiate OD - 1. Hold narcotics for the moment 2. Pulse ox and tele monitor 3. Doesn't need further narcan for the moment, but  will write as a PRN in case of emergency. 2. HGB-SS disease with crisis - 1. Hold narcotics due to OD state for the moment 2. Resume carefully if needed after OD symptoms improved. 3. Transfuse 2u PRBC for HGB 6.0 4. Repeat CBC in AM  3. DM2 - 1. Lantus 10 QHS 2. Sensitive scale SSI AC 4. Hyperkalemia - 1. Kayexelate dose now 2. Repeat K in AM 3. Looks like he chronically has hyperkalemia and is on chronic Bi-weekly Kayexelate (rather than dialysis). 4. May need to either increase Kayexelate frequency or consider starting dialysis. 5. CKD stage 5 - 1. Stable creat 6. HTN - continue home BP meds  DVT prophylaxis: Lovenox Code Status: Full Family Communication: No family in room Disposition Plan: Home after admit Consults called: None Admission status: Admit to inpatient   Etta Quill DO [facility] Pager [phone] Only works nights!  If 7AM-7PM, please contact the primary day team physician taking care of patient  [email or web address] Password [email or web address]  11/13/2017, 11:01 PM

## 2017-11-13 NOTE — ED Provider Notes (Signed)
[facility] DEPT Provider Note   CSN: [id number] Arrival date & time: 11/13/17  1517     History   Chief Complaint Chief Complaint  Patient presents with  . Altered Mental Status  . Sickle Cell Pain Crisis    HPI [name] is a 67 y.o. male.  HPI Patient is unable to contribute significantly to history given altered mental status.  Level 5 caveat.  daughter is at bedside.  States that she believes the patient was having a sickle cell crisis this morning complaining of abdominal pain and low back pain.  Family report patient was given hydrocodone and Xanax prior to coming to the emergency department.  Currently is drowsy.  Continues to complain of low back pain which radiates to his right leg. Past Medical History:  Diagnosis Date  . Diabetes mellitus   . GERD (gastroesophageal reflux disease)   . Heart murmur   . Hypertension   . Pneumonia   . Renal insufficiency   . Sickle cell anemia (HCC)    Hgb SS disease     Patient Active Problem List   Diagnosis Date Noted  . Opiate overdose ([place]) 11/13/2017  . Acute urinary obstruction 03/29/2017  . Microcytic anemia   . Hb-SS disease with crisis ([place]) 03/27/2017  . Heart murmur 03/27/2016  . CKD (chronic kidney disease) stage 5, GFR less than 15 ml/min (HCC) 03/26/2016  . Hb-SS disease without crisis ([place])   . Hyperkalemia, diminished renal excretion 09/03/2014  . Leukocytosis 07/28/2011  . LBBB (left bundle branch block) 07/28/2011  . Hypertension   . Type 2 diabetes mellitus with renal complication [facility])     Past Surgical History:  Procedure Laterality Date  . AV FISTULA PLACEMENT Left 11/22/2016   Procedure: INSERTION OF ARTERIOVENOUS (AV) GORE-TEX GRAFT  LEFT FOREARM USING 6MM X 50CM GORETEX GRAFT;  Surgeon: Serafina Mitchell, MD;  Location: [facility];  Service: Vascular;  Laterality: Left;  . CATARACT EXTRACTION W/ INTRAOCULAR LENS  IMPLANT, BILATERAL    . CHOLECYSTECTOMY    . COLON  SURGERY     Colon cancer surgery, removed small amt not full colectomy  . COLONOSCOPY     nclear who follows him for this  . LEG SURGERY     For ? gangrene after running into barbwire fence at 67yo        Home Medications    Prior to Admission medications   Medication Sig Start Date End Date Taking? Authorizing Provider  acetaminophen (TYLENOL) 500 MG tablet Take 500 mg by mouth every 6 (six) hours as needed for headache (pain).   Yes [provider]  amLODipine (NORVASC) 5 MG tablet Take 5 mg by mouth daily. 10/04/17  Yes [provider]  carvedilol (COREG) 25 MG tablet Take 25 mg by mouth 2 (two) times daily. 09/04/17  Yes [provider]  folic acid (FOLVITE) 1 MG tablet Take 1 tablet (1 mg total) by mouth daily. 04/18/13  Yes Robbie Lis, MD  gabapentin (NEURONTIN) 300 MG capsule TAKE 1 (ONE) CAPSULE AT BEDTIME 10/25/17  Yes [provider]  glipiZIDE (GLUCOTROL XL) 10 MG 24 hr tablet Take 10 mg by mouth 2 (two) times daily. 10/22/17  Yes [provider]  insulin aspart (NOVOLOG FLEXPEN) 100 UNIT/ML FlexPen Inject 2-6 Units into the skin 3 (three) times daily as needed for high blood sugar (CBG >150).   Yes [provider]  insulin glargine (LANTUS) 100 unit/mL SOPN Inject 20 Units  into the skin at bedtime.    Yes [provider]  omeprazole (PRILOSEC) 20 MG capsule Take 20 mg by mouth daily.    Yes [provider]  saxagliptin HCl (ONGLYZA) 5 MG TABS tablet Take 0.5 tablets (2.5 mg total) by mouth daily. 12/29/16  Yes Mariel Aloe, MD  tamsulosin (FLOMAX) 0.4 MG CAPS capsule Take 1 capsule (0.4 mg total) by mouth daily. 04/03/17  Yes Leana Gamer, MD  VENTOLIN HFA 108 (90 Base) MCG/ACT inhaler INHALE 2 PUFFS BY MOUTH 4 TIMES A DAY AS NEEDED FOR SOB AND WHEEZING 09/03/17  Yes [provider]  Docusate Calcium (STOOL SOFTENER PO) Take 1 capsule by mouth daily as needed (constipation).    [provider]  furosemide (LASIX) 80 MG tablet Take 0.5 tablets (40 mg total) by mouth daily as needed for edema (Weight more than 2 pounds above 177 pounds). 04/02/17   Leana Gamer, MD  polyethylene glycol powder (MIRALAX) powder Take 17 g by mouth daily. Patient taking differently: Take 17 g by mouth daily as needed (constipation). Mix in 8 oz liquid and drink 10/28/16   Palumbo, April, MD  Sennosides (SENNA LAXATIVE PO) Take 1 tablet by mouth daily as needed (constipation).    [provider]  sodium polystyrene (KAYEXALATE) 15 GM/60ML suspension 30 grams 2 x week on Monday and Friday. Next dose due on Friday. Patient taking differently: Take 15 g by mouth once.  04/02/17   Leana Gamer, MD  tiZANidine (ZANAFLEX) 4 MG tablet Take 4 mg by mouth 2 (two) times daily as needed for muscle spasms.  09/24/17   [provider]    Family History Family History  Problem Relation Age of Onset  . Venous thrombosis Mother        Dec. of blood clot   . Diabetes type II Mother   . AAA (abdominal aortic aneurysm) Mother   . Throat cancer Father        Deceased of throat cancer    Social History Social History   Tobacco Use  . Smoking status: Former Smoker    Packs/day: 0.50    Years: 20.00    Pack years: 10.00    Last attempt to quit: 06/05/1966    Years since quitting: 51.4  . Smokeless tobacco: Never Used  Substance Use Topics  . Alcohol use: Yes    Comment: rare beer  . Drug use: Yes    Types: Cocaine    Comment: last used crack cocaine  November 2017     Allergies   Morphine and related   Review of Systems Review of Systems  Unable to perform ROS: Mental status change     Physical Exam Updated Vital Signs BP (!) 160/68   Pulse 74   Temp 99.2 F (37.3 C) (Oral)   Resp 14   SpO2 100%   Physical Exam  Constitutional: He appears well-developed and well-nourished.  Drowsy but arousable  HENT:  Head: Normocephalic and atraumatic.    Mouth/Throat: Oropharynx is clear and moist.  No evidence of head injury.  No intraoral injury.  Eyes: Pupils are equal, round, and reactive to light. EOM are normal.  Pupils are pinpoint bilaterally.  Neck: Normal range of motion. Neck supple. No JVD present.  Cardiovascular: Normal rate and regular rhythm. Exam reveals no gallop and no friction rub.  No murmur heard. Pulmonary/Chest: Effort normal and breath sounds normal. No stridor. No respiratory distress. He has no wheezes. He  has no rales. He exhibits no tenderness.  Abdominal: Soft. Bowel sounds are normal. He exhibits distension. There is no tenderness. There is no rebound and no guarding.  Distended abdomen without obvious tenderness to palpation.  Musculoskeletal: Normal range of motion. He exhibits no edema or tenderness.  Patient has midline lumbar and right lumbar paraspinal tenderness to palpation.  No lower extremity swelling, asymmetry or tenderness.  Distal pulses intact.  Lymphadenopathy:    He has no cervical adenopathy.  Neurological:  Drowsy but arousable.  Moving all extremities without follows commands.  5/5 motor in all extremities.  Decreased sensation to light touch to the right foot.  Otherwise sensation intact.  No saddle anesthesia.  Skin: Skin is warm and dry. Capillary refill takes less than 2 seconds. No rash noted. No erythema.  Psychiatric: He has a normal mood and affect. His behavior is normal.  Nursing note and vitals reviewed.    ED Treatments / Results  Labs (all labs ordered are listed, but only abnormal results are displayed) Labs Reviewed  COMPREHENSIVE METABOLIC PANEL - Abnormal; Notable for the following components:      Result Value   Potassium 5.9 (*)    Glucose, Bld 191 (*)    BUN 47 (*)    Creatinine, Ser 4.09 (*)    ALT 13 (*)    Total Bilirubin 1.9 (*)    GFR calc non Af Amer 14 (*)    GFR calc Af Amer 16 (*)    All other components within normal limits  CBC - Abnormal; Notable  for the following components:   WBC 19.5 (*)    RBC 2.75 (*)    Hemoglobin 6.0 (*)    HCT 18.8 (*)    MCV 68.4 (*)    MCH 21.8 (*)    RDW 17.7 (*)    All other components within normal limits  URINALYSIS, ROUTINE W REFLEX MICROSCOPIC - Abnormal; Notable for the following components:   Protein, ur 30 (*)    All other components within normal limits  RETICULOCYTES - Abnormal; Notable for the following components:   Retic Ct Pct 7.8 (*)    RBC. 2.74 (*)    Retic Count, Absolute 213.7 (*)    All other components within normal limits  CBG MONITORING, ED - Abnormal; Notable for the following components:   Glucose-Capillary 184 (*)    All other components within normal limits  RAPID URINE DRUG SCREEN, HOSP PERFORMED  ETHANOL  LIPASE, BLOOD  BASIC METABOLIC PANEL  CBC  HIV ANTIBODY (ROUTINE TESTING)  PREPARE RBC (CROSSMATCH)  TYPE AND SCREEN    EKG EKG Interpretation  Date/Time:  Tuesday November 13 2017 22:33:19 EDT Ventricular Rate:  73 PR Interval:    QRS Duration: 158 QT Interval:  408 QTC Calculation: 450 R Axis:   -34 Text Interpretation:  Sinus rhythm Left bundle branch block Baseline wander in lead(s) V6 Confirmed by Julianne Rice [phone]) on 11/13/2017 10:36:19 PM   Radiology Dg Abd Acute W/chest  Result Date: 11/13/2017 CLINICAL DATA:  Abdominal pain and distention for 2-3 days. History of sickle cell disease. EXAM: DG ABDOMEN ACUTE W/ 1V CHEST COMPARISON:  Chest x-ray 03/26/2017 and abdominal film 03/27/2017 FINDINGS: The semi upright AP chest x-ray demonstrates cardiac enlargement and vascular congestion without overt pulmonary edema or definite infiltrates. Bony changes of sickle cell disease noted. Two views of the abdomen demonstrate an unremarkable bowel gas pattern. No findings to suggest obstruction or perforation. The soft tissue shadows are  grossly maintained. Small calcified spleen is noted in the left upper quadrant. Surgical clips in the right upper quadrant  from a previous cholecystectomy. Extensive bony changes of sickle cell disease. IMPRESSION: No plain film findings for an acute abdominal process. Electronically Signed   By: Marijo Sanes M.D.   On: 11/13/2017 17:54    Procedures Procedures (including critical care time)  Medications Ordered in ED Medications  0.9 %  sodium chloride infusion (has no administration in time range)  carvedilol (COREG) tablet 25 mg (has no administration in time range)  amLODipine (NORVASC) tablet 5 mg (has no administration in time range)  folic acid (FOLVITE) tablet 1 mg (has no administration in time range)  gabapentin (NEURONTIN) capsule 300 mg (has no administration in time range)  sodium polystyrene (KAYEXALATE) 15 GM/60ML suspension 30 g (has no administration in time range)  insulin aspart (novoLOG) injection 0-9 Units (has no administration in time range)  insulin glargine (LANTUS) injection 10 Units (has no administration in time range)  tamsulosin (FLOMAX) capsule 0.4 mg (has no administration in time range)  albuterol (PROVENTIL HFA;VENTOLIN HFA) 108 (90 Base) MCG/ACT inhaler 2 puff (has no administration in time range)  docusate sodium (COLACE) capsule 100 mg (has no administration in time range)  pantoprazole (PROTONIX) EC tablet 40 mg (has no administration in time range)  senna (SENOKOT) tablet 8.6 mg (has no administration in time range)  acetaminophen (TYLENOL) tablet 650 mg (has no administration in time range)    Or  acetaminophen (TYLENOL) suppository 650 mg (has no administration in time range)  ondansetron (ZOFRAN) tablet 4 mg (has no administration in time range)    Or  ondansetron (ZOFRAN) injection 4 mg (has no administration in time range)  heparin injection 5,000 Units (has no administration in time range)  naloxone [facility]) injection 0.4 mg (has no administration in time range)  naloxone [facility]) injection 0.2 mg (0.2 mg Intravenous Given 11/13/17 2056)     Initial Impression  / Assessment and Plan / ED Course  I have reviewed the triage vital signs and the nursing notes.  Pertinent labs & imaging results that were available during my care of the patient were reviewed by me and considered in my medical decision making (see chart for details).     Given small dose of Narcan with patient becoming more alert, nauseated and complaining of pain to his extremities.  Lasted 15 to 20 minutes then became more sedate.  Saturation in the low 90s.  Placed on oxygen. Discussed with hospitalist will see patient in the emergency department and admit. Final Clinical Impressions(s) / ED Diagnoses   Final diagnoses:  Altered mental status, unspecified altered mental status type  Anemia, unspecified type  Hyperkalemia    ED Discharge Orders    None       Julianne Rice, MD 11/13/17 2322

## 2017-11-13 NOTE — ED Notes (Signed)
Charge RN at bedside attempting US IV. 

## 2017-11-13 NOTE — ED Notes (Signed)
Mitzi Davenport, daughter, [phone]

## 2017-11-13 NOTE — ED Triage Notes (Signed)
Per EMS, patient from home, c/o lower back pain r/t SCC since this morning. Denies chest pain. Ambulatory. A&Ox4.

## 2017-11-13 NOTE — ED Notes (Signed)
Patient family called this RN to bedside stating patient has been out of pain medication for approximately one month. Patient reports taking hydrocodone and xanax this morning. Family reports this medication is not prescribed to the patient. A&Ox3 at this time. Patient drowsy during time of assessment. VSS.

## 2017-11-13 NOTE — ED Notes (Signed)
Patient made aware urine sample is needed. Provided with urinal.

## 2017-11-13 NOTE — ED Notes (Signed)
Pt is resting and appears comfortable.  Drowsy but responds to verbal stimuli

## 2017-11-13 NOTE — ED Notes (Signed)
Bed: WA03 Expected date:  Expected time:  Means of arrival:  Comments: Ems sickle cell

## 2017-11-13 NOTE — ED Notes (Signed)
Patient transported to X-ray 

## 2017-11-14 ENCOUNTER — Encounter (HOSPITAL_COMMUNITY): Payer: Self-pay

## 2017-11-14 ENCOUNTER — Other Ambulatory Visit: Payer: Self-pay

## 2017-11-14 LAB — CBC
HCT: 24.1 % — ABNORMAL LOW (ref 39.0–52.0)
HEMOGLOBIN: 7.8 g/dL — AB (ref 13.0–17.0)
MCH: 23.6 pg — AB (ref 26.0–34.0)
MCHC: 32.4 g/dL (ref 30.0–36.0)
MCV: 73 fL — AB (ref 78.0–100.0)
Platelets: 265 10*3/uL (ref 150–400)
RBC: 3.3 MIL/uL — AB (ref 4.22–5.81)
RDW: 20.8 % — ABNORMAL HIGH (ref 11.5–15.5)
WBC: 15.8 10*3/uL — ABNORMAL HIGH (ref 4.0–10.5)

## 2017-11-14 LAB — HIV ANTIBODY (ROUTINE TESTING W REFLEX): HIV Screen 4th Generation wRfx: NONREACTIVE

## 2017-11-14 LAB — BASIC METABOLIC PANEL
Anion gap: 6 (ref 5–15)
BUN: 49 mg/dL — AB (ref 6–20)
CHLORIDE: 109 mmol/L (ref 101–111)
CO2: 22 mmol/L (ref 22–32)
CREATININE: 4.04 mg/dL — AB (ref 0.61–1.24)
Calcium: 9.1 mg/dL (ref 8.9–10.3)
GFR calc Af Amer: 16 mL/min — ABNORMAL LOW (ref >60)
GFR calc non Af Amer: 14 mL/min — ABNORMAL LOW (ref >60)
Glucose, Bld: 139 mg/dL — ABNORMAL HIGH (ref 65–99)
POTASSIUM: 5.2 mmol/L — AB (ref 3.5–5.1)
Sodium: 137 mmol/L (ref 135–145)

## 2017-11-14 LAB — GLUCOSE, CAPILLARY
GLUCOSE-CAPILLARY: 49 mg/dL — AB (ref 65–99)
GLUCOSE-CAPILLARY: 94 mg/dL (ref 65–99)
Glucose-Capillary: 116 mg/dL — ABNORMAL HIGH (ref 65–99)
Glucose-Capillary: 127 mg/dL — ABNORMAL HIGH (ref 65–99)

## 2017-11-14 MED ORDER — HYDROMORPHONE HCL 1 MG/ML IJ SOLN
1.0000 mg | INTRAMUSCULAR | Status: DC | PRN
  Administered 2017-11-14 – 2017-11-16 (×11): 1 mg via INTRAVENOUS
  Filled 2017-11-14 (×11): qty 1

## 2017-11-14 NOTE — ED Notes (Signed)
ED TO INPATIENT HANDOFF REPORT  Name/Age/Gender [name] 67 y.o. male  Code Status    Code Status Orders  (From admission, onward)        Start     Ordered   11/13/17 2247  Full code  Continuous     11/13/17 2247    Code Status History    Date Active Date Inactive Code Status Order ID Comments User Context   03/27/2017 0436 04/02/2017 1942 Full Code [id number]  Toy Baker, MD Inpatient   01/24/2017 0224 01/26/2017 1818 Full Code [id number]  Reubin [name], MD Inpatient   01/24/2017 0224 01/24/2017 0224 Full Code [id number]  Reubin [name], MD Inpatient   12/27/2016 0600 12/29/2016 2212 Full Code [id number]  Rise Patience, MD ED   11/04/2016 2358 11/12/2016 1816 Full Code [id number]  Etta Quill, DO ED   06/02/2016 2315 06/06/2016 2045 Full Code [id number]  Etta Quill, DO ED   03/25/2016 0906 03/27/2016 2037 Full Code [id number]  Rondel Jumbo, PA-C ED   03/25/2016 0738 03/25/2016 0906 Full Code [id number]  Rondel Jumbo, PA-C ED   03/25/2016 0738 03/25/2016 0738 Full Code [id number]  Rondel Jumbo, PA-C ED   09/03/2014 0711 09/09/2014 1417 Full Code [id number]  Lavina Hamman, MD ED   04/14/2013 0247 04/18/2013 1642 Full Code [id number]  Toy Baker, MD Inpatient   04/09/2012 1953 04/15/2012 1307 Full Code [id number]  Renata Caprice, RN Inpatient   07/28/2011 0351 07/31/2011 1720 Full Code [id number]  Ethel Rana, RN Inpatient      Home/SNF/Other Home  Chief Complaint sickle cell crisis  Level of Care/Admitting Diagnosis ED Disposition    ED Disposition Condition [facility]: [facility] [[id number]]  Level of Care: Telemetry [5]  Admit to tele based on following criteria: Complex arrhythmia (Bradycardia/Tachycardia)  Admit to tele based on following criteria: Other see comments  Comments: Hyperkalemia  Diagnosis: Opiate overdose, accidental or unintentional, initial encounter [facility]) [[id number]]   Admitting Physician: Doreatha Massed  Attending Physician: Etta Quill [phone]  Estimated length of stay: past midnight tomorrow  Certification:: I certify this patient will need inpatient services for at least 2 midnights  PT Class (Do Not Modify): Inpatient [101]  PT Acc Code (Do Not Modify): Private [1]       Medical History Past Medical History:  Diagnosis Date  . Diabetes mellitus   . GERD (gastroesophageal reflux disease)   . Heart murmur   . Hypertension   . Pneumonia   . Renal insufficiency   . Sickle cell anemia (HCC)    Hgb SS disease     Allergies Allergies  Allergen Reactions  . Morphine And Related Itching    IV Location/Drains/Wounds Patient Lines/Drains/Airways Status   Active Line/Drains/Airways    Name:   Placement date:   Placement time:   Site:   Days:   Peripheral IV 11/13/17 Right   11/13/17    1623    -   1   Fistula / Graft Left Forearm Arteriovenous vein graft   11/22/16    1337    Forearm   357   Urethral Catheter sheyla b Non-latex 16 Fr.   03/29/17    1500    Non-latex   230   Incision (Closed) 11/22/16 Arm Left   11/22/16    1445     357          Labs/Imaging Results  for orders placed or performed during the hospital encounter of 11/13/17 (from the past 48 hour(s))  Comprehensive metabolic panel     Status: Abnormal   Collection Time: 11/13/17  4:21 PM  Result Value Ref Range   Sodium 135 135 - 145 mmol/L   Potassium 5.9 (H) 3.5 - 5.1 mmol/L   Chloride 105 101 - 111 mmol/L   CO2 24 22 - 32 mmol/L   Glucose, Bld 191 (H) 65 - 99 mg/dL   BUN 47 (H) 6 - 20 mg/dL   Creatinine, Ser 4.09 (H) 0.61 - 1.24 mg/dL   Calcium 9.1 8.9 - 10.3 mg/dL   Total Protein 8.0 6.5 - 8.1 g/dL   Albumin 4.3 3.5 - 5.0 g/dL   AST 26 15 - 41 U/L   ALT 13 (L) 17 - 63 U/L   Alkaline Phosphatase 88 38 - 126 U/L   Total Bilirubin 1.9 (H) 0.3 - 1.2 mg/dL   GFR calc non Af Amer 14 (L) >60 mL/min   GFR calc Af Amer 16 (L) >60 mL/min    Comment:  (NOTE) The eGFR has been calculated using the CKD EPI equation. This calculation has not been validated in all clinical situations. eGFR's persistently <60 mL/min signify possible Chronic Kidney Disease.    Anion gap 6 5 - 15    Comment: Performed at [facility], [place] [address] [place], [place] [address]  CBC     Status: Abnormal   Collection Time: 11/13/17  4:21 PM  Result Value Ref Range   WBC 19.5 (H) 4.0 - 10.5 K/uL   RBC 2.75 (L) 4.22 - 5.81 MIL/uL   Hemoglobin 6.0 (LL) 13.0 - 17.0 g/dL    Comment: REPEATED TO VERIFY CRITICAL RESULT CALLED TO, READ BACK BY AND VERIFIED WITH: Q.MILNER AT 1731 ON 11/13/17 BY N.THOMPSON    HCT 18.8 (L) 39.0 - 52.0 %   MCV 68.4 (L) 78.0 - 100.0 fL   MCH 21.8 (L) 26.0 - 34.0 pg   MCHC 31.9 30.0 - 36.0 g/dL   RDW 17.7 (H) 11.5 - 15.5 %   Platelets 319 150 - 400 K/uL    Comment: Performed at [facility], [place] [address] [place], [place] [address]  Ethanol     Status: None   Collection Time: 11/13/17  4:21 PM  Result Value Ref Range   Alcohol, Ethyl (B) <10 <10 mg/dL    Comment: (NOTE) Lowest detectable limit for serum alcohol is 10 mg/dL. For medical purposes only. Performed at [facility], [place] [address], [place], [place] [address]   Lipase, blood     Status: None   Collection Time: 11/13/17  4:21 PM  Result Value Ref Range   Lipase 31 11 - 51 U/L    Comment: Performed at [facility], [place] [address] [place], [place] [address]  CBG monitoring, ED     Status: Abnormal   Collection Time: 11/13/17  5:13 PM  Result Value Ref Range   Glucose-Capillary 184 (H) 65 - 99 mg/dL  Urinalysis, Routine w reflex microscopic     Status: Abnormal   Collection Time: 11/13/17  6:26 PM  Result Value Ref Range   Color, Urine YELLOW YELLOW   APPearance CLEAR CLEAR   Specific Gravity, Urine 1.012 1.005 - 1.030   pH 5.0 5.0 - 8.0   Glucose, UA NEGATIVE NEGATIVE mg/dL   Hgb  urine dipstick NEGATIVE NEGATIVE   Bilirubin Urine NEGATIVE NEGATIVE  Ketones, ur NEGATIVE NEGATIVE mg/dL   Protein, ur 30 (A) NEGATIVE mg/dL   Nitrite NEGATIVE NEGATIVE   Leukocytes, UA NEGATIVE NEGATIVE   RBC / HPF 0-5 0 - 5 RBC/hpf   WBC, UA 0-5 0 - 5 WBC/hpf   Bacteria, UA NONE SEEN NONE SEEN    Comment: Performed at [facility], [place] [address], [place], [place] [address]  Rapid urine drug screen (hospital performed)     Status: None   Collection Time: 11/13/17  6:26 PM  Result Value Ref Range   Opiates NONE DETECTED NONE DETECTED   Cocaine NONE DETECTED NONE DETECTED   Benzodiazepines NONE DETECTED NONE DETECTED   Amphetamines NONE DETECTED NONE DETECTED   Tetrahydrocannabinol NONE DETECTED NONE DETECTED   Barbiturates NONE DETECTED NONE DETECTED    Comment: (NOTE) DRUG SCREEN FOR MEDICAL PURPOSES ONLY.  IF CONFIRMATION IS NEEDED FOR ANY PURPOSE, NOTIFY LAB WITHIN 5 DAYS. LOWEST DETECTABLE LIMITS FOR URINE DRUG SCREEN Drug Class                     Cutoff (ng/mL) Amphetamine and metabolites    1000 Barbiturate and metabolites    200 Benzodiazepine                 916 Tricyclics and metabolites     300 Opiates and metabolites        300 Cocaine and metabolites        300 THC                            50 Performed at [facility], [place] [address] [place], [place] [address]   Prepare RBC     Status: None   Collection Time: 11/13/17  8:11 PM  Result Value Ref Range   Order Confirmation      ORDER PROCESSED BY BLOOD BANK Performed at [place] [address], [place], [place] [address]   Type and screen [place]     Status: None (Preliminary result)   Collection Time: 11/13/17  8:34 PM  Result Value Ref Range   ABO/RH(D) AB POS    Antibody Screen NEG    Sample Expiration 11/16/2017    Unit Number [id number]    Blood Component Type RED CELLS,LR    Unit division 00    Status of  Unit ALLOCATED    Transfusion Status OK TO TRANSFUSE    Crossmatch Result      Compatible Performed at [facility], [place] [address], [place], [place] [address]    Unit Number [id number]    Blood Component Type RED CELLS,LR    Unit division 00    Status of Unit ALLOCATED    Transfusion Status OK TO TRANSFUSE    Crossmatch Result Compatible   Reticulocytes     Status: Abnormal   Collection Time: 11/13/17  9:00 PM  Result Value Ref Range   Retic Ct Pct 7.8 (H) 0.4 - 3.1 %   RBC. 2.74 (L) 4.22 - 5.81 MIL/uL   Retic Count, Absolute 213.7 (H) 19.0 - 186.0 K/uL    Comment: Performed at [facility], [place] [address], [place], [place] [address]   Dg Abd Acute W/chest  Result Date: 11/13/2017 CLINICAL DATA:  Abdominal pain and distention for 2-3 days. History of sickle cell disease. EXAM: DG ABDOMEN ACUTE W/ 1V CHEST COMPARISON:  Chest x-ray 03/26/2017  and abdominal film 03/27/2017 FINDINGS: The semi upright AP chest x-ray demonstrates cardiac enlargement and vascular congestion without overt pulmonary edema or definite infiltrates. Bony changes of sickle cell disease noted. Two views of the abdomen demonstrate an unremarkable bowel gas pattern. No findings to suggest obstruction or perforation. The soft tissue shadows are grossly maintained. Small calcified spleen is noted in the left upper quadrant. Surgical clips in the right upper quadrant from a previous cholecystectomy. Extensive bony changes of sickle cell disease. IMPRESSION: No plain film findings for an acute abdominal process. Electronically Signed   By: Marijo Sanes M.D.   On: 11/13/2017 17:54    Pending Labs Unresulted Labs (From admission, onward)   Start     Ordered   11/14/17 0712  Basic metabolic panel  Tomorrow morning,   R     11/13/17 2212   11/14/17 0500  CBC  Tomorrow morning,   R     11/13/17 2247   11/13/17 2300  HIV antibody (Routine Testing)  Add-on,   R     11/13/17 2259       Vitals/Pain Today's Vitals   11/13/17 2230 11/13/17 2300 11/13/17 2331 11/14/17 0000  BP: (!) 160/68 (!) 153/70 (!) 159/74 (!) 153/65  Pulse: 74 74 76 73  Resp: 14  (!) 21 14  Temp:      TempSrc:      SpO2: 100% 100% 100% 100%  PainSc:        Isolation Precautions No active isolations  Medications Medications  0.9 %  sodium chloride infusion (has no administration in time range)  carvedilol (COREG) tablet 25 mg (has no administration in time range)  amLODipine (NORVASC) tablet 5 mg (has no administration in time range)  folic acid (FOLVITE) tablet 1 mg (has no administration in time range)  gabapentin (NEURONTIN) capsule 300 mg (has no administration in time range)  sodium polystyrene (KAYEXALATE) 15 GM/60ML suspension 30 g (has no administration in time range)  insulin aspart (novoLOG) injection 0-9 Units (has no administration in time range)  insulin glargine (LANTUS) injection 10 Units (has no administration in time range)  tamsulosin (FLOMAX) capsule 0.4 mg (has no administration in time range)  albuterol (PROVENTIL HFA;VENTOLIN HFA) 108 (90 Base) MCG/ACT inhaler 2 puff (has no administration in time range)  docusate sodium (COLACE) capsule 100 mg (has no administration in time range)  pantoprazole (PROTONIX) EC tablet 40 mg (has no administration in time range)  senna (SENOKOT) tablet 8.6 mg (has no administration in time range)  acetaminophen (TYLENOL) tablet 650 mg (has no administration in time range)    Or  acetaminophen (TYLENOL) suppository 650 mg (has no administration in time range)  ondansetron (ZOFRAN) tablet 4 mg (has no administration in time range)    Or  ondansetron (ZOFRAN) injection 4 mg (has no administration in time range)  heparin injection 5,000 Units (has no administration in time range)  naloxone (NARCAN) injection 0.4 mg (has no administration in time range)  naloxone [facility]) injection 0.2 mg (0.2 mg Intravenous Given 11/13/17 2056)     Mobility walks with person assist

## 2017-11-14 NOTE — Plan of Care (Signed)
Patient stable during 7 a to 7 p shift, medicated for pain in legs x 3 with improvement.  Patient noted to have low blood sugar at supper likely due to not eating lunch, CBG improved after drinking juice and nurse did stress to patient that he must eat protein and fats with his carbs at supper to maintain sugar levels.  Patient voiced understanding.  Multiple family members at bedside during shift.

## 2017-11-14 NOTE — ED Notes (Signed)
Pts wife came to the ED today looking for a bag of patient belongings. After review it ws found the belongings were in the patients room 1434 on the 4th floor.   Daughter is aware and will notify the wife.

## 2017-11-14 NOTE — Progress Notes (Signed)
Subjective: Is 67 year old gentleman known sickle cell disease, diabetes and chronic kidney disease stage V admitted overnight with altered mental status.  Patient presented of hot narcotic overdose.  Patient has had Narcan in the ER.  He is currently more awake and alert.  He also took Xanax prior to arrival in the ER.  Patient is awake and is still having some pain.  No fever or chills no nausea vomiting or diarrhea.  He has not been on any long-acting chronic pain medications prior to admission.  Objective: Vital signs in last 24 hours: Temp:  [99.1 F (37.3 C)-100.2 F (37.9 C)] 99.4 F (37.4 C) (06/12 0900) Pulse Rate:  [69-84] 77 (06/12 0900) Resp:  [13-21] 18 (06/12 0900) BP: (133-169)/(61-98) 143/69 (06/12 0900) SpO2:  [92 %-100 %] 98 % (06/12 0900) Weight:  [82.4 kg (181 lb 10.5 oz)] 82.4 kg (181 lb 10.5 oz) (06/12 0213) Weight change:     Intake/Output from previous day: 06/11 0701 - 06/12 0700 In: 315 [Blood:315] Out: 100 [Urine:100] Intake/Output this shift: Total I/O In: 315 [Blood:315] Out: 200 [Urine:200]  General appearance: alert, cooperative, appears stated age and no distress Neck: no adenopathy, no carotid bruit, no JVD, supple, symmetrical, trachea midline and thyroid not enlarged, symmetric, no tenderness/mass/nodules Back: symmetric, no curvature. ROM normal. No CVA tenderness. Resp: clear to auscultation bilaterally Cardio: regular rate and rhythm, S1, S2 normal, no murmur, click, rub or gallop GI: soft, non-tender; bowel sounds normal; no masses,  no organomegaly Extremities: extremities normal, atraumatic, no cyanosis or edema Pulses: 2+ and symmetric Neurologic: Grossly normal  Lab Results: Recent Labs    11/13/17 1621  WBC 19.5*  HGB 6.0*  HCT 18.8*  PLT 319   BMET Recent Labs    11/13/17 1621 11/14/17 1059  NA 135 137  K 5.9* 5.2*  CL 105 109  CO2 24 22  GLUCOSE 191* 139*  BUN 47* 49*  CREATININE 4.09* 4.04*  CALCIUM 9.1 9.1     Studies/Results: Dg Abd Acute W/chest  Result Date: 11/13/2017 CLINICAL DATA:  Abdominal pain and distention for 2-3 days. History of sickle cell disease. EXAM: DG ABDOMEN ACUTE W/ 1V CHEST COMPARISON:  Chest x-ray 03/26/2017 and abdominal film 03/27/2017 FINDINGS: The semi upright AP chest x-ray demonstrates cardiac enlargement and vascular congestion without overt pulmonary edema or definite infiltrates. Bony changes of sickle cell disease noted. Two views of the abdomen demonstrate an unremarkable bowel gas pattern. No findings to suggest obstruction or perforation. The soft tissue shadows are grossly maintained. Small calcified spleen is noted in the left upper quadrant. Surgical clips in the right upper quadrant from a previous cholecystectomy. Extensive bony changes of sickle cell disease. IMPRESSION: No plain film findings for an acute abdominal process. Electronically Signed   By: Marijo Sanes M.D.   On: 11/13/2017 17:54    Medications: I have reviewed the patient's current medications.  Assessment/Plan: 67 year old gentleman with sickle cell crisis and narcotic overdose mistakenly  #1 altered mental status: This appears to have resolved.  Most likely due to combined and Xanax and hydrocodone.  Continue to monitor patient closely overnight.  So far he appears to be doing better.  #2 sickle cell disease with some pain: Patient has mild crisis.  I will put him on IV Dilaudid 1 mg every 3 hours when necessary.  I will see how he reacts disease.  His pain is controlled will leave him alone otherwise consider the PCA with the lowest dose.  Patient cannot get  Toradol due to kidney disease.  He is probably not clearing narcotics fossa and off which led to his altered mental status.  #3 chronic kidney disease stage V: This is advanced.  Patient is in preparation for possible hemodialysis.  Monitor renal function  #4 diabetes: Continue home regimen with sliding scale insulin  #5  hypertension: Blood pressure slightly elevated.  No change in home treatment  LOS: 1 day   Soriyah Osberg,LAWAL 11/14/2017, 11:34 AM

## 2017-11-14 NOTE — ED Notes (Signed)
Alcario Drought MD aware of pt's temp of 100.2.  Pre-medicate with tylenol prior to starting blood transfusion.  Attempted to call receiving nurse Hinton Dyer RN without answer.  Spoke to [place] regarding pt and gave her an update.  She verbalized that she will notify Jim Like of the update

## 2017-11-14 NOTE — Plan of Care (Signed)
[facility]   Case discussed with Dr. [name] sickle cell team, he will take over the care of the patient as, patient continues to complaint of sickle cell pain.   Chipper Oman, MD

## 2017-11-15 LAB — COMPREHENSIVE METABOLIC PANEL
ALK PHOS: 81 U/L (ref 38–126)
ALT: 11 U/L — AB (ref 17–63)
AST: 20 U/L (ref 15–41)
Albumin: 3.5 g/dL (ref 3.5–5.0)
Anion gap: 11 (ref 5–15)
BUN: 51 mg/dL — AB (ref 6–20)
CALCIUM: 9.1 mg/dL (ref 8.9–10.3)
CHLORIDE: 108 mmol/L (ref 101–111)
CO2: 19 mmol/L — AB (ref 22–32)
CREATININE: 4.18 mg/dL — AB (ref 0.61–1.24)
GFR calc Af Amer: 15 mL/min — ABNORMAL LOW (ref >60)
GFR calc non Af Amer: 13 mL/min — ABNORMAL LOW (ref >60)
GLUCOSE: 115 mg/dL — AB (ref 65–99)
Potassium: 5.6 mmol/L — ABNORMAL HIGH (ref 3.5–5.1)
SODIUM: 138 mmol/L (ref 135–145)
Total Bilirubin: 3.8 mg/dL — ABNORMAL HIGH (ref 0.3–1.2)
Total Protein: 7.1 g/dL (ref 6.5–8.1)

## 2017-11-15 LAB — TYPE AND SCREEN
ABO/RH(D): AB POS
Antibody Screen: NEGATIVE
Unit division: 0
Unit division: 0

## 2017-11-15 LAB — GLUCOSE, CAPILLARY
Glucose-Capillary: 125 mg/dL — ABNORMAL HIGH (ref 65–99)
Glucose-Capillary: 110 mg/dL — ABNORMAL HIGH (ref 65–99)
Glucose-Capillary: 88 mg/dL (ref 65–99)
Glucose-Capillary: 127 mg/dL — ABNORMAL HIGH (ref 65–99)
Glucose-Capillary: 114 mg/dL — ABNORMAL HIGH (ref 65–99)
Glucose-Capillary: 74 mg/dL (ref 65–99)

## 2017-11-15 LAB — BPAM RBC
BLOOD PRODUCT EXPIRATION DATE: 201907012359
BLOOD PRODUCT EXPIRATION DATE: 201907012359
ISSUE DATE / TIME: 201906120327
ISSUE DATE / TIME: 201906120631
Unit Type and Rh: 6200
Unit Type and Rh: 6200

## 2017-11-15 LAB — CBC WITH DIFFERENTIAL/PLATELET
BASOS ABS: 0 10*3/uL (ref 0.0–0.1)
BASOS PCT: 0 %
EOS ABS: 0.5 10*3/uL (ref 0.0–0.7)
Eosinophils Relative: 3 %
HCT: 23.2 % — ABNORMAL LOW (ref 39.0–52.0)
HEMOGLOBIN: 7.5 g/dL — AB (ref 13.0–17.0)
Lymphocytes Relative: 12 %
Lymphs Abs: 2.1 10*3/uL (ref 0.7–4.0)
MCH: 23.4 pg — AB (ref 26.0–34.0)
MCHC: 32.3 g/dL (ref 30.0–36.0)
MCV: 72.3 fL — ABNORMAL LOW (ref 78.0–100.0)
MONO ABS: 3 10*3/uL — AB (ref 0.1–1.0)
MONOS PCT: 17 %
NEUTROS PCT: 68 %
NRBC: 2 /100{WBCs} — AB
Neutro Abs: 12.1 10*3/uL — ABNORMAL HIGH (ref 1.7–7.7)
PLATELETS: 265 10*3/uL (ref 150–400)
RBC: 3.21 MIL/uL — ABNORMAL LOW (ref 4.22–5.81)
RDW: 20.5 % — ABNORMAL HIGH (ref 11.5–15.5)
WBC: 17.7 10*3/uL — ABNORMAL HIGH (ref 4.0–10.5)

## 2017-11-15 MED ORDER — SODIUM CHLORIDE 0.9 % IV SOLN
INTRAVENOUS | Status: DC
  Administered 2017-11-15 – 2017-11-16 (×3): via INTRAVENOUS

## 2017-11-15 MED ORDER — SODIUM POLYSTYRENE SULFONATE 15 GM/60ML PO SUSP
30.0000 g | Freq: Once | ORAL | Status: AC
  Administered 2017-11-15: 30 g via ORAL
  Filled 2017-11-15: qty 120

## 2017-11-15 NOTE — Progress Notes (Signed)
Subjective: Patient is slowly improving.  Still has pain in back and legs.  For the most part he appears stable close to his baseline.  His renal function has gotten worse and potassium has gotten worse.  Objective: Vital signs in last 24 hours: Temp:  [99.1 F (37.3 C)-100.3 F (37.9 C)] 100.3 F (37.9 C) (06/13 0522) Pulse Rate:  [72-79] 79 (06/13 0522) Resp:  [16-20] 20 (06/13 0522) BP: (143-148)/(68-90) 148/68 (06/13 0522) SpO2:  [90 %-98 %] 92 % (06/13 0522) Weight change:  Last BM Date: 11/13/17  Intake/Output from previous day: 06/12 0701 - 06/13 0700 In: 315 [Blood:315] Out: 575 [Urine:575] Intake/Output this shift: No intake/output data recorded.  General appearance: alert, cooperative, appears stated age and no distress Neck: no adenopathy, no carotid bruit, no JVD, supple, symmetrical, trachea midline and thyroid not enlarged, symmetric, no tenderness/mass/nodules Back: symmetric, no curvature. ROM normal. No CVA tenderness. Resp: clear to auscultation bilaterally Cardio: regular rate and rhythm, S1, S2 normal, no murmur, click, rub or gallop GI: soft, non-tender; bowel sounds normal; no masses,  no organomegaly Extremities: extremities normal, atraumatic, no cyanosis or edema Pulses: 2+ and symmetric Neurologic: Grossly normal  Lab Results: Recent Labs    11/14/17 1059 11/15/17 0430  WBC 15.8* 17.7*  HGB 7.8* 7.5*  HCT 24.1* 23.2*  PLT 265 265   BMET Recent Labs    11/14/17 1059 11/15/17 0430  NA 137 138  K 5.2* 5.6*  CL 109 108  CO2 22 19*  GLUCOSE 139* 115*  BUN 49* 51*  CREATININE 4.04* 4.18*  CALCIUM 9.1 9.1    Studies/Results: Dg Abd Acute W/chest  Result Date: 11/13/2017 CLINICAL DATA:  Abdominal pain and distention for 2-3 days. History of sickle cell disease. EXAM: DG ABDOMEN ACUTE W/ 1V CHEST COMPARISON:  Chest x-ray 03/26/2017 and abdominal film 03/27/2017 FINDINGS: The semi upright AP chest x-ray demonstrates cardiac enlargement and  vascular congestion without overt pulmonary edema or definite infiltrates. Bony changes of sickle cell disease noted. Two views of the abdomen demonstrate an unremarkable bowel gas pattern. No findings to suggest obstruction or perforation. The soft tissue shadows are grossly maintained. Small calcified spleen is noted in the left upper quadrant. Surgical clips in the right upper quadrant from a previous cholecystectomy. Extensive bony changes of sickle cell disease. IMPRESSION: No plain film findings for an acute abdominal process. Electronically Signed   By: Marijo Sanes M.D.   On: 11/13/2017 17:54    Medications: I have reviewed the patient's current medications.  Assessment/Plan: 67 year old gentleman with sickle cell crisis and narcotic overdose mistakenly  #1 altered mental status: resolved. Due to unintentional overdose.  Continue to monitor patient's mental status overnight.  Avoid heavy medications  #2 sickle cell disease with some pain: Pain is down to 3 out of 10.  When necessary Dilaudid 1 mg ordered.  #3 chronic kidney disease stage V: BUN/creatinine have elevated slightly.  Patient is being followed by nephrology.  #4 diabetes: Continue home regimen with sliding scale insulin  #5 hypertension: Blood pressure slightly elevated.  No change in home treatment  #6 hypokalemia: Potassium is 5.6.  We will give Kayexalate and recheck in the morning.  Avoid nephrotoxic medications.   LOS: 2 days   GARBA,LAWAL 11/15/2017, 7:22 AM

## 2017-11-15 NOTE — Progress Notes (Addendum)
Inpatient Diabetes Program Recommendations  AACE/ADA: New Consensus Statement on Inpatient Glycemic Control (2015)  Target Ranges:  Prepandial:   less than 140 mg/dL      Peak postprandial:   less than 180 mg/dL (1-2 hours)      Critically ill patients:  140 - 180 mg/dL   Results for [name], [name] (MRN [id number]) as of 11/15/2017 09:23  Ref. Range 11/14/2017 11:38 11/14/2017 16:35 11/14/2017 17:43 11/14/2017 20:45 11/15/2017 05:20 11/15/2017 07:39  Glucose-Capillary Latest Ref Range: 65 - 99 mg/dL 127 (H) 49 (L) 94 125 (H) 110 (H) 88   Review of Glycemic Control  Diabetes history: DM 2 Outpatient Diabetes medications: Lantus 20 units, Novolog 2-6 units tid, Onglyza 2.5 mg Daily, Glipizide 10 mg BID Current orders for Inpatient glycemic control: Lantus 10 units, Novolog Sensitive Correction 0-9 units tid  Inpatient Diabetes Program Recommendations:    Patient is very sensitive to Novolog, Glucose of 127 covered with 1 units of insulin dropped patient to 49 mg/dl. Please consider current Novolog Correction scale and order a custom Novolog Correction scale that starts at 150 mg/dl.  -Custom Novolog correction scale 0-5 units       151-200  1 unit      201-250  2 units      251-300  3 units      301-350  4 units      351-400  5 units  Noted A1c listed as <4.2 on 03/27/2017. Patient's Hgb low this admission an A1c would not be accurate in addition to the sickle cell. Fructosamine level would be appropriate to indicate glucose trends at home.   Consider decreasing Lantus to 8 units.  Thanks,  Tama Headings RN, MSN, BC-ADM, [facility] Inpatient Diabetes Coordinator Team Pager [phone] (8a-5p)

## 2017-11-16 LAB — CBC WITH DIFFERENTIAL/PLATELET
BASOS PCT: 0 %
Basophils Absolute: 0.1 10*3/uL (ref 0.0–0.1)
EOS ABS: 0.7 10*3/uL (ref 0.0–0.7)
Eosinophils Relative: 4 %
HCT: 23 % — ABNORMAL LOW (ref 39.0–52.0)
HEMOGLOBIN: 7.3 g/dL — AB (ref 13.0–17.0)
Lymphocytes Relative: 13 %
Lymphs Abs: 2.5 10*3/uL (ref 0.7–4.0)
MCH: 23.3 pg — AB (ref 26.0–34.0)
MCHC: 31.7 g/dL (ref 30.0–36.0)
MCV: 73.5 fL — ABNORMAL LOW (ref 78.0–100.0)
MONO ABS: 3 10*3/uL (ref 0.1–1.0)
MONOS PCT: 15 %
Neutro Abs: 13.5 10*3/uL (ref 1.7–7.7)
Neutrophils Relative %: 68 %
PLATELETS: 266 10*3/uL (ref 150–400)
RBC: 3.13 MIL/uL — ABNORMAL LOW (ref 4.22–5.81)
RDW: 21 % — ABNORMAL HIGH (ref 11.5–15.5)
WBC: 19.7 10*3/uL — ABNORMAL HIGH (ref 4.0–10.5)

## 2017-11-16 LAB — COMPREHENSIVE METABOLIC PANEL
ALK PHOS: 76 U/L (ref 38–126)
ALT: 12 U/L — ABNORMAL LOW (ref 17–63)
ANION GAP: 8 (ref 5–15)
AST: 21 U/L (ref 15–41)
Albumin: 3.2 g/dL — ABNORMAL LOW (ref 3.5–5.0)
BUN: 45 mg/dL — ABNORMAL HIGH (ref 6–20)
CALCIUM: 8.7 mg/dL — AB (ref 8.9–10.3)
CO2: 21 mmol/L — AB (ref 22–32)
Chloride: 113 mmol/L — ABNORMAL HIGH (ref 101–111)
Creatinine, Ser: 3.64 mg/dL — ABNORMAL HIGH (ref 0.61–1.24)
GFR calc non Af Amer: 16 mL/min — ABNORMAL LOW (ref >60)
GFR, EST AFRICAN AMERICAN: 18 mL/min — AB (ref >60)
Glucose, Bld: 86 mg/dL (ref 65–99)
Potassium: 4.8 mmol/L (ref 3.5–5.1)
SODIUM: 142 mmol/L (ref 135–145)
Total Bilirubin: 2.8 mg/dL — ABNORMAL HIGH (ref 0.3–1.2)
Total Protein: 6.7 g/dL (ref 6.5–8.1)

## 2017-11-16 LAB — MAGNESIUM: Magnesium: 2 mg/dL (ref 1.7–2.4)

## 2017-11-16 LAB — GLUCOSE, CAPILLARY
GLUCOSE-CAPILLARY: 67 mg/dL (ref 65–99)
Glucose-Capillary: 137 mg/dL — ABNORMAL HIGH (ref 65–99)

## 2017-11-16 NOTE — Care Management Important Message (Signed)
Important Message  Patient Details  Name: [name] MRN: [id number] Date of Birth: [birth year]   Medicare Important Message Given:  Yes    Kerin Salen 11/16/2017, 11:49 AMImportant Message  Patient Details  Name: [name] MRN: [id number] Date of Birth: [birth year]   Medicare Important Message Given:  Yes    Kerin Salen 11/16/2017, 11:49 AM

## 2017-11-16 NOTE — Progress Notes (Signed)
Patient had 6.62 second run of V Fib. Asymptomatic. VS obtained, EKG obtained. On call provider, Bodenheimer, notified. Order placed for magnesium lab draw. Will continue to monitor.

## 2017-11-16 NOTE — Discharge Summary (Signed)
Physician Discharge Summary  Patient ID: [name] MRN: [id number] DOB/AGE: 08-28-1950 67 y.o.  Admit date: 11/13/2017 Discharge date: 11/16/2017  Admission Diagnoses:  Discharge Diagnoses:  Principal Problem:   Opiate overdose ([place]) Active Problems:   Hypertension   Type 2 diabetes mellitus with renal complication (HCC)   LBBB (left bundle branch block)   Hyperkalemia, diminished renal excretion   CKD (chronic kidney disease) stage 5, GFR less than 15 ml/min (HCC)   Hb-SS disease with crisis [facility])   Discharged Condition: good  Hospital Course: patient is a 67 year old gentleman with end-stage renal disease not yet on hemodialysis who is also on hydrocodone for sickle cell disease chronic pain as well as Xanax for anxiety disorder.  Patient took both tablets at the same time at home and became confused.  He was brought in with diagnosis of altered mental status is secondary to opiate overdose.  It was unintentional most likely due to advanced renal disease.  He was admitted and all stopped.  Narcan was administered.  Patient continued to do very well.  He was treated for pain with low dose Dilaudid. Patient subsequently was much better and discharged home after counseling given.  He has other chronic medical problems including diabetes hypertension and hypokalemia which was treated with Kayexalate.  Patient was back to baseline at time of discharge.  Consults: None  Significant Diagnostic Studies: labs: CBC and CMP  Checked daily.    Treatments: IV hydration and analgesia: acetaminophen and Dilaudid  Discharge Exam: Blood pressure (!) 145/69, pulse 76, temperature 98.8 F (37.1 C), temperature source Oral, resp. rate 16, height 6\' 1"  (1.854 m), weight 82.4 kg (181 lb 10.5 oz), SpO2 94 %. General appearance: alert, cooperative, appears stated age and no distress Back: symmetric, no curvature. ROM normal. No CVA tenderness. Resp: clear to auscultation bilaterally Cardio: regular  rate and rhythm, S1, S2 normal, no murmur, click, rub or gallop GI: soft, non-tender; bowel sounds normal; no masses,  no organomegaly Extremities: extremities normal, atraumatic, no cyanosis or edema Pulses: 2+ and symmetric Skin: Skin color, texture, turgor normal. No rashes or lesions Neurologic: Grossly normal  Disposition: Discharge disposition: 01-Home or Self Care       Discharge Instructions    Diet - low sodium heart healthy   Complete by:  As directed    Increase activity slowly   Complete by:  As directed      Allergies as of 11/16/2017      Reactions   Morphine And Related Itching      Medication List    TAKE these medications   acetaminophen 500 MG tablet Commonly known as:  TYLENOL Take 500 mg by mouth every 6 (six) hours as needed for headache (pain).   amLODipine 5 MG tablet Commonly known as:  NORVASC Take 5 mg by mouth daily.   carvedilol 25 MG tablet Commonly known as:  COREG Take 25 mg by mouth 2 (two) times daily.   folic acid 1 MG tablet Commonly known as:  FOLVITE Take 1 tablet (1 mg total) by mouth daily.   furosemide 80 MG tablet Commonly known as:  LASIX Take 0.5 tablets (40 mg total) by mouth daily as needed for edema (Weight more than 2 pounds above 177 pounds).   gabapentin 300 MG capsule Commonly known as:  NEURONTIN TAKE 1 (ONE) CAPSULE AT BEDTIME   glipiZIDE 10 MG 24 hr tablet Commonly known as:  GLUCOTROL XL Take 10 mg by mouth 2 (two) times daily.  insulin glargine 100 unit/mL Sopn Commonly known as:  LANTUS Inject 20 Units into the skin at bedtime.   NOVOLOG FLEXPEN 100 UNIT/ML FlexPen Generic drug:  insulin aspart Inject 2-6 Units into the skin 3 (three) times daily as needed for high blood sugar (CBG >150).   omeprazole 20 MG capsule Commonly known as:  PRILOSEC Take 20 mg by mouth daily.   polyethylene glycol powder powder Commonly known as:  MIRALAX Take 17 g by mouth daily. What changed:    when to take  this  reasons to take this  additional instructions   saxagliptin HCl 5 MG Tabs tablet Commonly known as:  ONGLYZA Take 0.5 tablets (2.5 mg total) by mouth daily.   SENNA LAXATIVE PO Take 1 tablet by mouth daily as needed (constipation).   sodium polystyrene 15 GM/60ML suspension Commonly known as:  KAYEXALATE 30 grams 2 x week on Monday and Friday. Next dose due on Friday. What changed:    how much to take  how to take this  when to take this  additional instructions   STOOL SOFTENER PO Take 1 capsule by mouth daily as needed (constipation).   tamsulosin 0.4 MG Caps capsule Commonly known as:  FLOMAX Take 1 capsule (0.4 mg total) by mouth daily.   tiZANidine 4 MG tablet Commonly known as:  ZANAFLEX Take 4 mg by mouth 2 (two) times daily as needed for muscle spasms.   VENTOLIN HFA 108 (90 Base) MCG/ACT inhaler Generic drug:  albuterol INHALE 2 PUFFS BY MOUTH 4 TIMES A DAY AS NEEDED FOR SOB AND WHEEZING        Signed: GARBA,LAWAL 11/16/2017, 3:09 PM   Time spent 33 minutes

## 2017-12-26 IMAGING — DX DG CHEST 2V
2 series · 2 of 2 positions shown · non-contrast
Comparison: 11/08/2016

CLINICAL DATA: Cough and fever for 2 days

EXAM:
CHEST  2 VIEW

[chest pa]
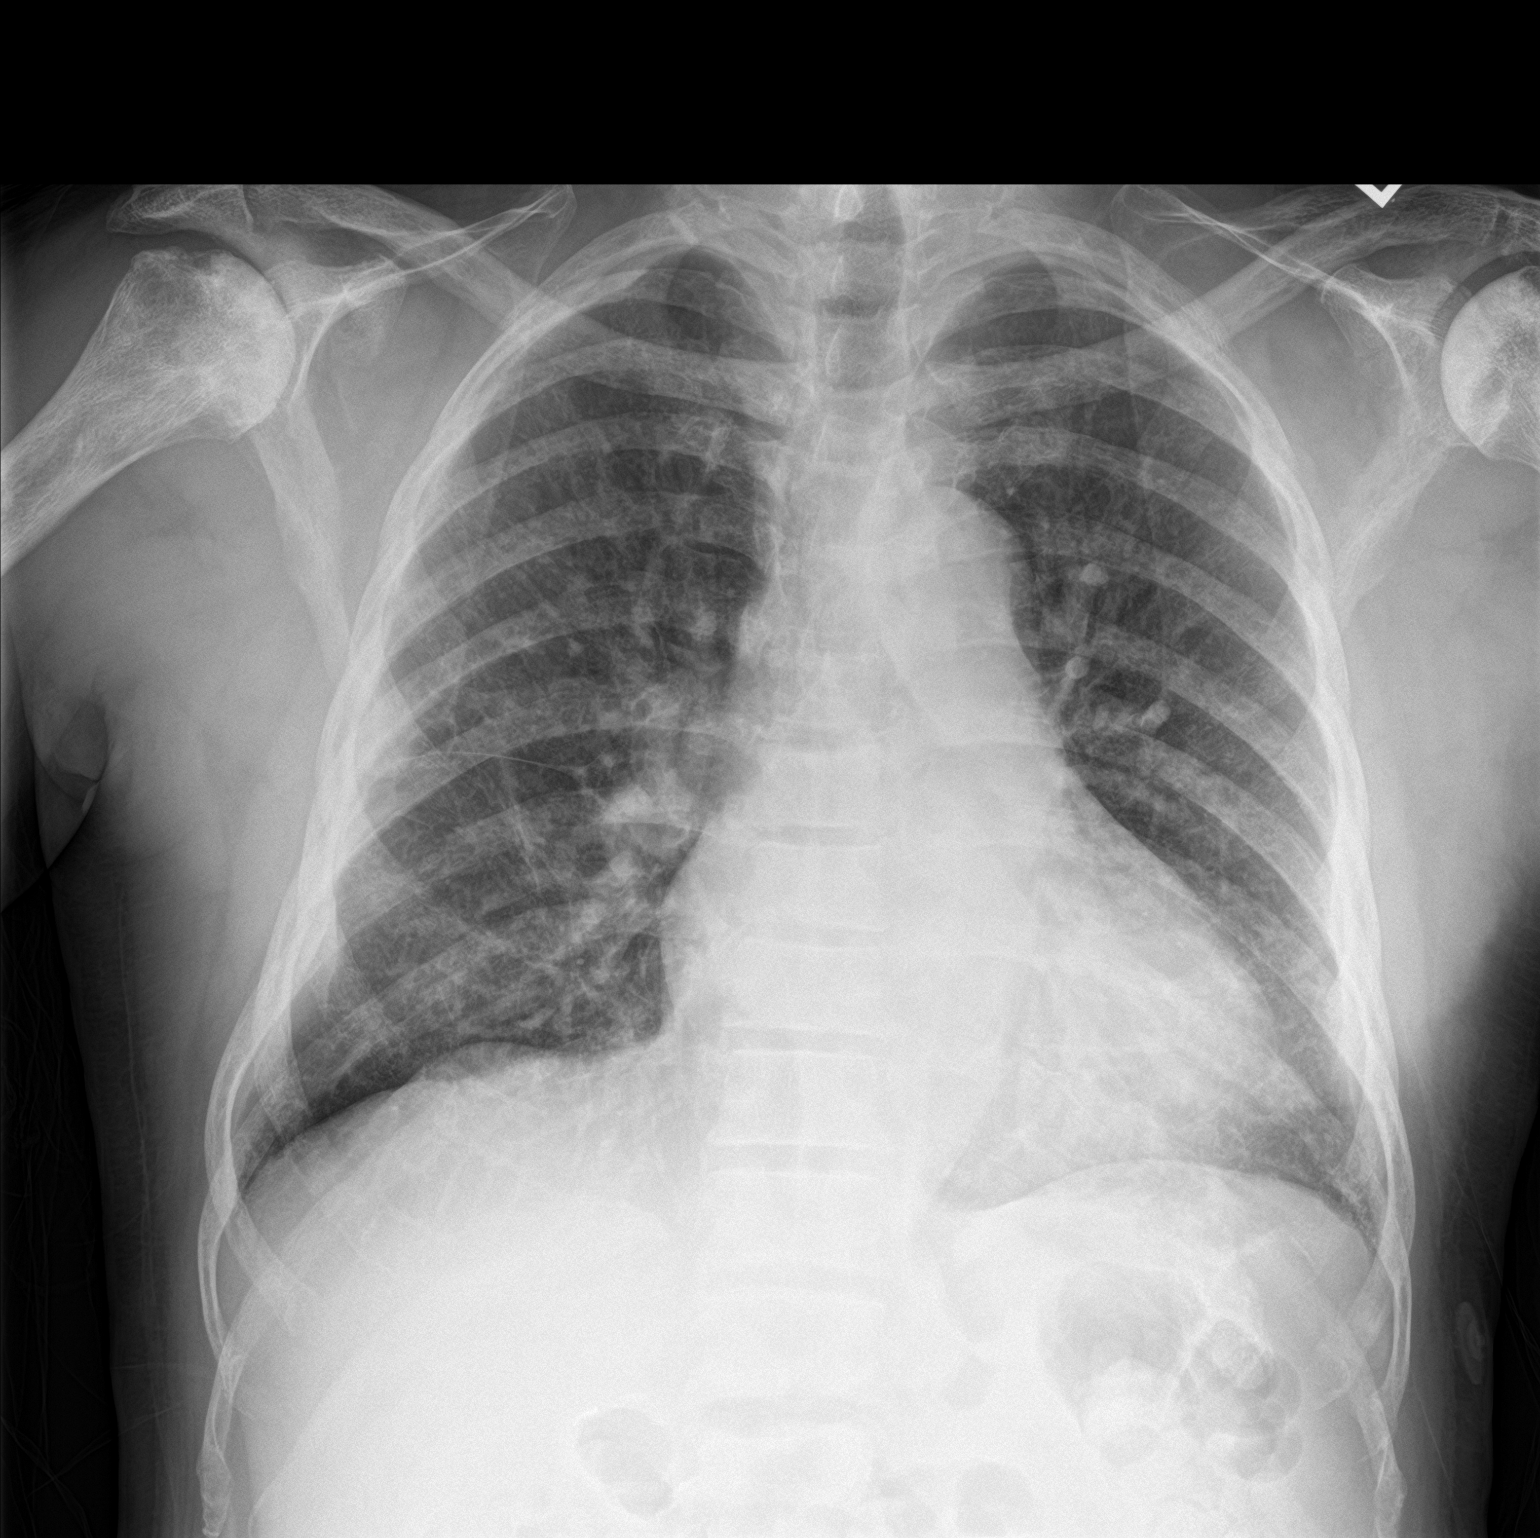

[chest lat]
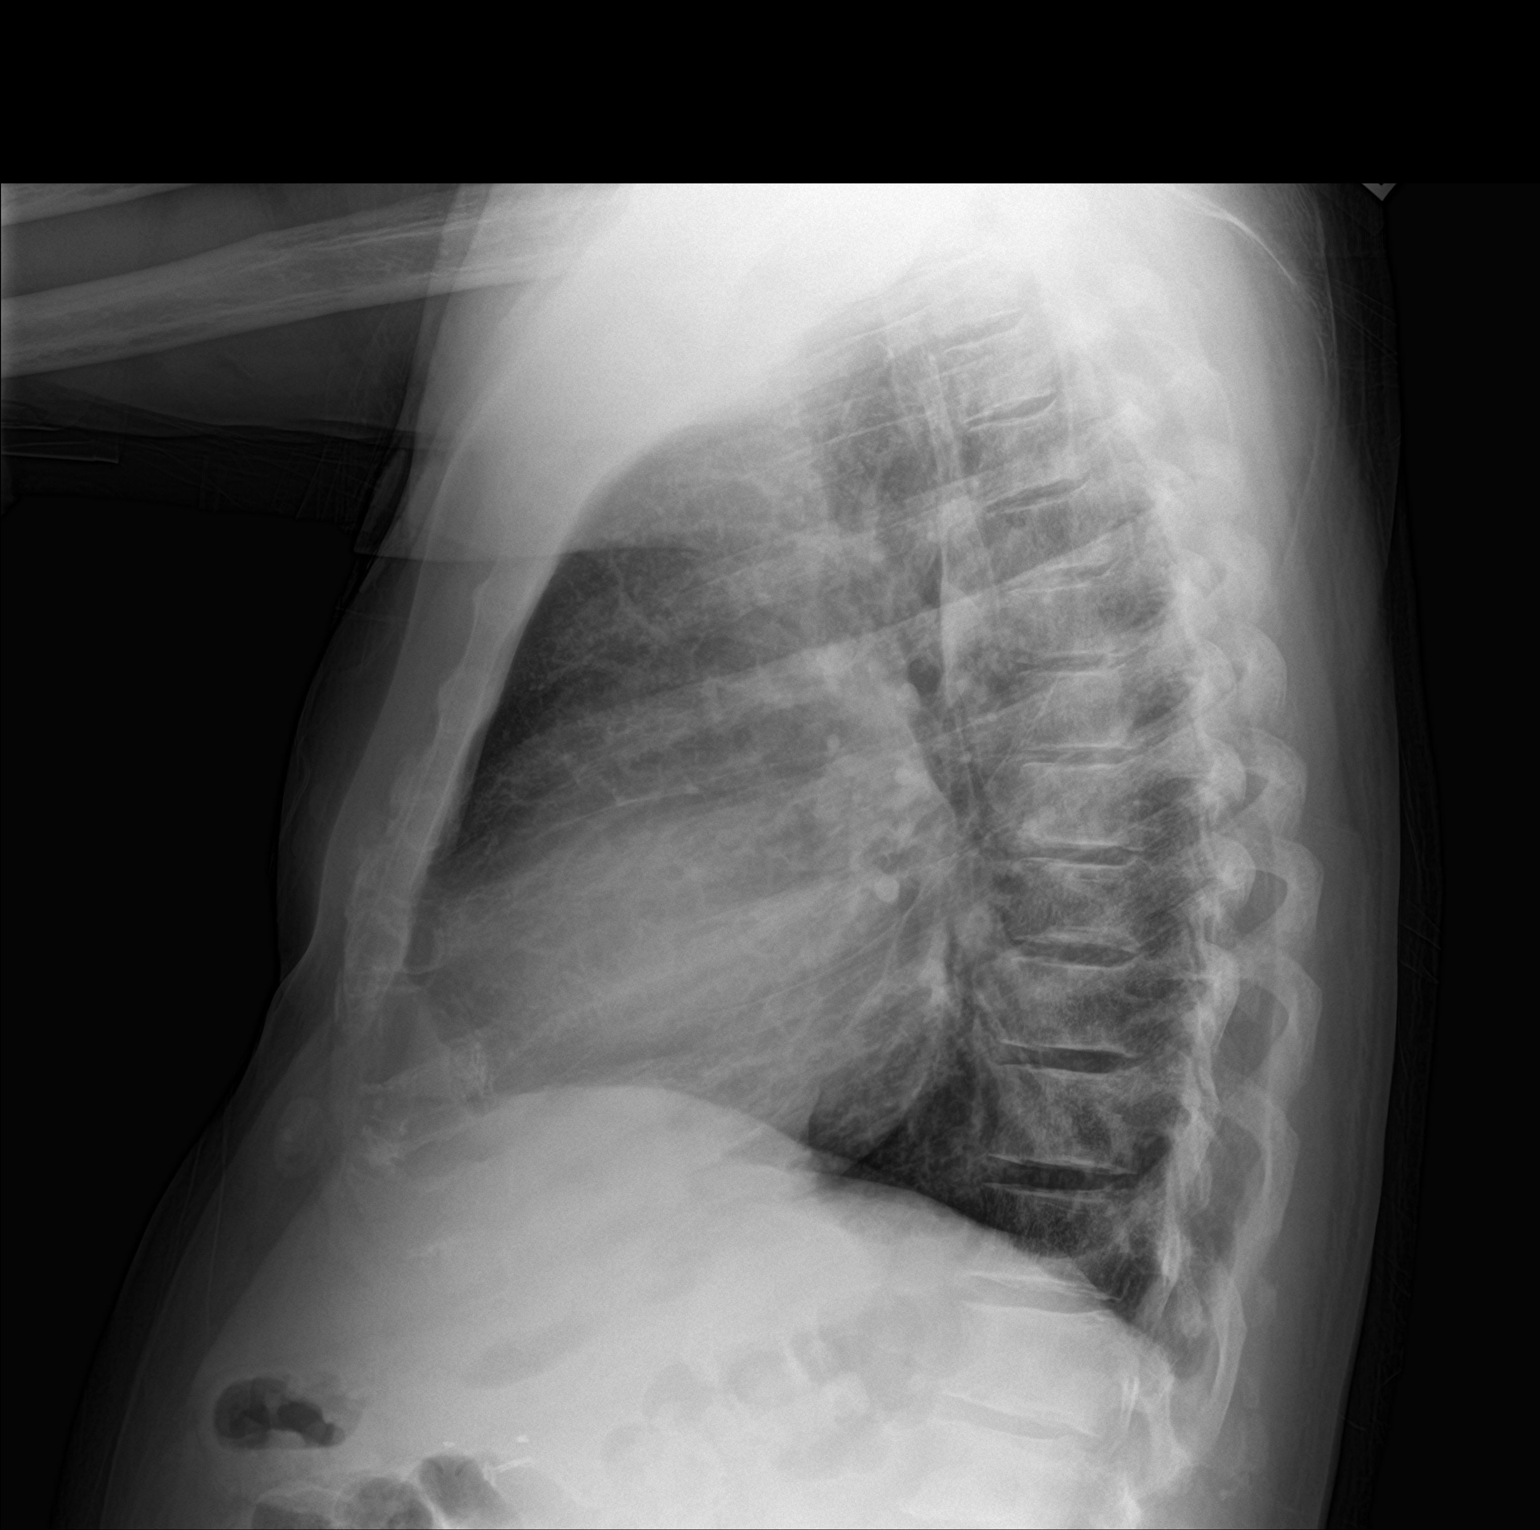

[2 of 2 positions shown; findings below may reference images not displayed]

FINDINGS: Cardiac shadow is mildly enlarged. Mild vascular prominence is again
seen and stable. This may be related to a component of volume
overload. No focal infiltrate is seen. Sclerotic changes are noted
throughout the bony structures which may be related to the
underlying renal disease.
IMPRESSION: Mild vascular prominence which may be related to volume overload. No
focal infiltrate is seen.

Sclerotic changes in the bone likely related to the underlying renal
disease.

## 2018-02-07 ENCOUNTER — Encounter (HOSPITAL_COMMUNITY): Payer: Self-pay | Admitting: *Deleted

## 2018-02-07 ENCOUNTER — Other Ambulatory Visit: Payer: Self-pay

## 2018-02-08 ENCOUNTER — Encounter (HOSPITAL_COMMUNITY): Payer: Self-pay | Admitting: Physician Assistant

## 2018-02-08 ENCOUNTER — Emergency Department (HOSPITAL_COMMUNITY): Payer: Medicare HMO

## 2018-02-08 ENCOUNTER — Other Ambulatory Visit: Payer: Self-pay

## 2018-02-08 ENCOUNTER — Encounter (HOSPITAL_COMMUNITY): Payer: Self-pay

## 2018-02-08 ENCOUNTER — Inpatient Hospital Stay (HOSPITAL_COMMUNITY)
Admission: EM | Admit: 2018-02-08 | Discharge: 2018-02-12 | DRG: 682 | Disposition: A | Discharge status: Home / Self Care | Payer: Medicare HMO | Attending: Internal Medicine | Admitting: Internal Medicine

## 2018-02-08 DIAGNOSIS — D5700 Hb-SS disease with crisis, unspecified: Secondary | ICD-10-CM | POA: Diagnosis present

## 2018-02-08 DIAGNOSIS — K219 Gastro-esophageal reflux disease without esophagitis: Secondary | ICD-10-CM | POA: Diagnosis present

## 2018-02-08 DIAGNOSIS — Z85038 Personal history of other malignant neoplasm of large intestine: Secondary | ICD-10-CM

## 2018-02-08 DIAGNOSIS — R1310 Dysphagia, unspecified: Secondary | ICD-10-CM | POA: Diagnosis present

## 2018-02-08 DIAGNOSIS — N184 Chronic kidney disease, stage 4 (severe): Secondary | ICD-10-CM | POA: Diagnosis not present

## 2018-02-08 DIAGNOSIS — F1410 Cocaine abuse, uncomplicated: Secondary | ICD-10-CM | POA: Diagnosis present

## 2018-02-08 DIAGNOSIS — D631 Anemia in chronic kidney disease: Secondary | ICD-10-CM | POA: Diagnosis present

## 2018-02-08 DIAGNOSIS — E1122 Type 2 diabetes mellitus with diabetic chronic kidney disease: Secondary | ICD-10-CM | POA: Diagnosis present

## 2018-02-08 DIAGNOSIS — N179 Acute kidney failure, unspecified: Principal | ICD-10-CM

## 2018-02-08 DIAGNOSIS — N170 Acute kidney failure with tubular necrosis: Secondary | ICD-10-CM | POA: Diagnosis not present

## 2018-02-08 DIAGNOSIS — I120 Hypertensive chronic kidney disease with stage 5 chronic kidney disease or end stage renal disease: Secondary | ICD-10-CM | POA: Diagnosis present

## 2018-02-08 DIAGNOSIS — Z79899 Other long term (current) drug therapy: Secondary | ICD-10-CM

## 2018-02-08 DIAGNOSIS — A419 Sepsis, unspecified organism: Secondary | ICD-10-CM | POA: Diagnosis not present

## 2018-02-08 DIAGNOSIS — Z794 Long term (current) use of insulin: Secondary | ICD-10-CM

## 2018-02-08 DIAGNOSIS — D72823 Leukemoid reaction: Secondary | ICD-10-CM

## 2018-02-08 DIAGNOSIS — Z23 Encounter for immunization: Secondary | ICD-10-CM

## 2018-02-08 DIAGNOSIS — E11649 Type 2 diabetes mellitus with hypoglycemia without coma: Secondary | ICD-10-CM | POA: Diagnosis not present

## 2018-02-08 DIAGNOSIS — R0789 Other chest pain: Secondary | ICD-10-CM | POA: Diagnosis present

## 2018-02-08 DIAGNOSIS — E860 Dehydration: Secondary | ICD-10-CM | POA: Diagnosis present

## 2018-02-08 DIAGNOSIS — N185 Chronic kidney disease, stage 5: Secondary | ICD-10-CM | POA: Diagnosis present

## 2018-02-08 DIAGNOSIS — E875 Hyperkalemia: Secondary | ICD-10-CM | POA: Diagnosis present

## 2018-02-08 DIAGNOSIS — D72829 Elevated white blood cell count, unspecified: Secondary | ICD-10-CM | POA: Diagnosis present

## 2018-02-08 DIAGNOSIS — R6510 Systemic inflammatory response syndrome (SIRS) of non-infectious origin without acute organ dysfunction: Secondary | ICD-10-CM | POA: Diagnosis not present

## 2018-02-08 DIAGNOSIS — E1129 Type 2 diabetes mellitus with other diabetic kidney complication: Secondary | ICD-10-CM | POA: Diagnosis present

## 2018-02-08 DIAGNOSIS — Z87891 Personal history of nicotine dependence: Secondary | ICD-10-CM

## 2018-02-08 DIAGNOSIS — Z9049 Acquired absence of other specified parts of digestive tract: Secondary | ICD-10-CM

## 2018-02-08 DIAGNOSIS — N189 Chronic kidney disease, unspecified: Secondary | ICD-10-CM

## 2018-02-08 DIAGNOSIS — E1121 Type 2 diabetes mellitus with diabetic nephropathy: Secondary | ICD-10-CM

## 2018-02-08 DIAGNOSIS — Z885 Allergy status to narcotic agent status: Secondary | ICD-10-CM

## 2018-02-08 LAB — CBC WITH DIFFERENTIAL/PLATELET
BASOS ABS: 0.2 10*3/uL — AB (ref 0.0–0.1)
Basophils Relative: 1 %
EOS PCT: 1 %
Eosinophils Absolute: 0.2 10*3/uL (ref 0.0–0.7)
HCT: 24.1 % — ABNORMAL LOW (ref 39.0–52.0)
Hemoglobin: 7.7 g/dL — ABNORMAL LOW (ref 13.0–17.0)
LYMPHS ABS: 1.9 10*3/uL (ref 0.7–4.0)
LYMPHS PCT: 10 %
MCH: 21.9 pg — ABNORMAL LOW (ref 26.0–34.0)
MCHC: 32 g/dL (ref 30.0–36.0)
MCV: 68.5 fL — ABNORMAL LOW (ref 78.0–100.0)
MONO ABS: 1.9 10*3/uL — AB (ref 0.1–1.0)
MONOS PCT: 10 %
NEUTROS PCT: 78 %
Neutro Abs: 15.2 10*3/uL — ABNORMAL HIGH (ref 1.7–7.7)
PLATELETS: 362 10*3/uL (ref 150–400)
RBC: 3.52 MIL/uL — AB (ref 4.22–5.81)
RDW: 17.4 % — AB (ref 11.5–15.5)
WBC: 19.4 10*3/uL — ABNORMAL HIGH (ref 4.0–10.5)

## 2018-02-08 LAB — URINALYSIS, ROUTINE W REFLEX MICROSCOPIC
Bilirubin Urine: NEGATIVE
Glucose, UA: NEGATIVE mg/dL
Ketones, ur: NEGATIVE mg/dL
Leukocytes, UA: NEGATIVE
NITRITE: NEGATIVE
Protein, ur: 30 mg/dL — AB
Specific Gravity, Urine: 1.01 (ref 1.005–1.030)
pH: 5 (ref 5.0–8.0)

## 2018-02-08 LAB — COMPREHENSIVE METABOLIC PANEL
ALBUMIN: 4 g/dL (ref 3.5–5.0)
ALT: 15 U/L (ref 0–44)
ANION GAP: 13 (ref 5–15)
AST: 35 U/L (ref 15–41)
Alkaline Phosphatase: 104 U/L (ref 38–126)
BUN: 55 mg/dL — ABNORMAL HIGH (ref 8–23)
CHLORIDE: 98 mmol/L (ref 98–111)
CO2: 16 mmol/L — AB (ref 22–32)
Calcium: 9.3 mg/dL (ref 8.9–10.3)
Creatinine, Ser: 5.11 mg/dL — ABNORMAL HIGH (ref 0.61–1.24)
GFR calc non Af Amer: 10 mL/min — ABNORMAL LOW (ref >60)
GFR, EST AFRICAN AMERICAN: 12 mL/min — AB (ref >60)
Glucose, Bld: 128 mg/dL — ABNORMAL HIGH (ref 70–99)
POTASSIUM: 5.5 mmol/L — AB (ref 3.5–5.1)
SODIUM: 127 mmol/L — AB (ref 135–145)
Total Bilirubin: 1.6 mg/dL — ABNORMAL HIGH (ref 0.3–1.2)
Total Protein: 8.2 g/dL — ABNORMAL HIGH (ref 6.5–8.1)

## 2018-02-08 LAB — RAPID URINE DRUG SCREEN, HOSP PERFORMED
AMPHETAMINES: NOT DETECTED
Barbiturates: NOT DETECTED
Benzodiazepines: NOT DETECTED
Cocaine: POSITIVE — AB
OPIATES: NOT DETECTED
Tetrahydrocannabinol: NOT DETECTED

## 2018-02-08 LAB — I-STAT TROPONIN, ED: Troponin i, poc: 0.03 ng/mL (ref 0.00–0.08)

## 2018-02-08 LAB — GLUCOSE, CAPILLARY: Glucose-Capillary: 149 mg/dL — ABNORMAL HIGH (ref 70–99)

## 2018-02-08 MED ORDER — HYDROCODONE-ACETAMINOPHEN 7.5-325 MG/15ML PO SOLN
10.0000 mL | Freq: Four times a day (QID) | ORAL | Status: DC | PRN
  Administered 2018-02-09 – 2018-02-11 (×9): 10 mL via ORAL
  Filled 2018-02-08 (×9): qty 15

## 2018-02-08 MED ORDER — SODIUM CHLORIDE 0.9 % IV SOLN
2.0000 g | Freq: Once | INTRAVENOUS | Status: AC
  Administered 2018-02-08: 2 g via INTRAVENOUS
  Filled 2018-02-08: qty 2

## 2018-02-08 MED ORDER — HEPARIN SODIUM (PORCINE) 5000 UNIT/ML IJ SOLN
5000.0000 [IU] | Freq: Three times a day (TID) | INTRAMUSCULAR | Status: DC
  Administered 2018-02-09: 5000 [IU] via SUBCUTANEOUS
  Filled 2018-02-08: qty 1

## 2018-02-08 MED ORDER — SODIUM CHLORIDE 0.9 % IV BOLUS (SEPSIS)
500.0000 mL | Freq: Once | INTRAVENOUS | Status: AC
  Administered 2018-02-09: 500 mL via INTRAVENOUS

## 2018-02-08 MED ORDER — PROMETHAZINE HCL 25 MG PO TABS: 25.0000 mg | ORAL_TABLET | ORAL | Status: DC | PRN

## 2018-02-08 MED ORDER — ALBUTEROL SULFATE (2.5 MG/3ML) 0.083% IN NEBU
3.0000 mL | INHALATION_SOLUTION | Freq: Four times a day (QID) | RESPIRATORY_TRACT | Status: DC
  Filled 2018-02-08: qty 3

## 2018-02-08 MED ORDER — POLYETHYLENE GLYCOL 3350 17 G PO PACK: 17.0000 g | PACK | Freq: Every day | ORAL | Status: DC | PRN

## 2018-02-08 MED ORDER — TIZANIDINE HCL 4 MG PO TABS
4.0000 mg | ORAL_TABLET | Freq: Two times a day (BID) | ORAL | Status: DC | PRN
  Administered 2018-02-09 – 2018-02-10 (×2): 4 mg via ORAL
  Filled 2018-02-08 (×2): qty 1

## 2018-02-08 MED ORDER — VANCOMYCIN HCL IN DEXTROSE 1-5 GM/200ML-% IV SOLN: 1000.0000 mg | Freq: Once | INTRAVENOUS | Status: DC

## 2018-02-08 MED ORDER — SODIUM CHLORIDE 0.9 % IV BOLUS (SEPSIS)
1000.0000 mL | Freq: Once | INTRAVENOUS | Status: AC
  Administered 2018-02-08: 1000 mL via INTRAVENOUS

## 2018-02-08 MED ORDER — INSULIN ASPART 100 UNIT/ML ~~LOC~~ SOLN
0.0000 [IU] | Freq: Three times a day (TID) | SUBCUTANEOUS | Status: DC
  Administered 2018-02-09: 2 [IU] via SUBCUTANEOUS
  Administered 2018-02-10: 1 [IU] via SUBCUTANEOUS

## 2018-02-08 MED ORDER — INSULIN ASPART 100 UNIT/ML ~~LOC~~ SOLN: 0.0000 [IU] | Freq: Every day | SUBCUTANEOUS | Status: DC

## 2018-02-08 MED ORDER — METRONIDAZOLE IN NACL 5-0.79 MG/ML-% IV SOLN
500.0000 mg | Freq: Three times a day (TID) | INTRAVENOUS | Status: DC
  Administered 2018-02-08: 500 mg via INTRAVENOUS
  Filled 2018-02-08 (×2): qty 100

## 2018-02-08 MED ORDER — PANTOPRAZOLE SODIUM 40 MG PO TBEC
40.0000 mg | DELAYED_RELEASE_TABLET | Freq: Every day | ORAL | Status: DC
  Administered 2018-02-09 – 2018-02-12 (×4): 40 mg via ORAL
  Filled 2018-02-08 (×4): qty 1

## 2018-02-08 MED ORDER — INSULIN GLARGINE 100 UNIT/ML ~~LOC~~ SOLN
20.0000 [IU] | Freq: Every day | SUBCUTANEOUS | Status: DC
  Administered 2018-02-09: 20 [IU] via SUBCUTANEOUS
  Filled 2018-02-08 (×2): qty 0.2

## 2018-02-08 MED ORDER — SODIUM CHLORIDE 0.9 % IV BOLUS (SEPSIS)
1000.0000 mL | Freq: Once | INTRAVENOUS | Status: AC
  Administered 2018-02-09: 1000 mL via INTRAVENOUS

## 2018-02-08 MED ORDER — SODIUM CHLORIDE 0.9 % IV SOLN
1.0000 g | INTRAVENOUS | Status: DC
  Administered 2018-02-09: 1 g via INTRAVENOUS
  Filled 2018-02-08: qty 1

## 2018-02-08 MED ORDER — SODIUM CHLORIDE 0.45 % IV SOLN: INTRAVENOUS | Status: DC

## 2018-02-08 MED ORDER — LINAGLIPTIN 5 MG PO TABS
5.0000 mg | ORAL_TABLET | Freq: Every day | ORAL | Status: DC
  Administered 2018-02-09: 5 mg via ORAL
  Filled 2018-02-08: qty 1

## 2018-02-08 MED ORDER — DOCUSATE SODIUM 100 MG PO CAPS
100.0000 mg | ORAL_CAPSULE | Freq: Every day | ORAL | Status: DC | PRN
  Administered 2018-02-10 – 2018-02-11 (×2): 100 mg via ORAL
  Filled 2018-02-08 (×2): qty 1

## 2018-02-08 MED ORDER — FOLIC ACID 1 MG PO TABS
1.0000 mg | ORAL_TABLET | Freq: Every day | ORAL | Status: DC
  Administered 2018-02-09 – 2018-02-12 (×4): 1 mg via ORAL
  Filled 2018-02-08 (×4): qty 1

## 2018-02-08 MED ORDER — METRONIDAZOLE IN NACL 5-0.79 MG/ML-% IV SOLN: 500.0000 mg | Freq: Three times a day (TID) | INTRAVENOUS | Status: DC

## 2018-02-08 MED ORDER — TAMSULOSIN HCL 0.4 MG PO CAPS
0.4000 mg | ORAL_CAPSULE | Freq: Every day | ORAL | Status: DC
  Administered 2018-02-09 – 2018-02-12 (×4): 0.4 mg via ORAL
  Filled 2018-02-08 (×4): qty 1

## 2018-02-08 MED ORDER — VANCOMYCIN HCL 10 G IV SOLR
1500.0000 mg | Freq: Once | INTRAVENOUS | Status: AC
  Administered 2018-02-09: 1500 mg via INTRAVENOUS
  Filled 2018-02-08: qty 1500

## 2018-02-08 MED ORDER — SENNA 8.6 MG PO TABS: 1.0000 | ORAL_TABLET | Freq: Every day | ORAL | Status: DC | PRN

## 2018-02-08 MED ORDER — HYDROCODONE-ACETAMINOPHEN 7.5-325 MG/15ML PO SOLN
10.0000 mL | Freq: Once | ORAL | Status: AC
  Administered 2018-02-08: 10 mL via ORAL
  Filled 2018-02-08: qty 15

## 2018-02-08 MED ORDER — CARVEDILOL 25 MG PO TABS
25.0000 mg | ORAL_TABLET | Freq: Two times a day (BID) | ORAL | Status: DC
  Administered 2018-02-09 – 2018-02-12 (×8): 25 mg via ORAL
  Filled 2018-02-08 (×8): qty 1

## 2018-02-08 MED ORDER — GLIPIZIDE ER 10 MG PO TB24
10.0000 mg | ORAL_TABLET | Freq: Two times a day (BID) | ORAL | Status: DC
  Administered 2018-02-09: 10 mg via ORAL
  Filled 2018-02-08 (×2): qty 1

## 2018-02-08 MED ORDER — SODIUM CHLORIDE 0.9 % IV BOLUS: 1000.0000 mL | Freq: Once | INTRAVENOUS | Status: DC

## 2018-02-08 MED ORDER — HYDROMORPHONE HCL 1 MG/ML IJ SOLN
1.0000 mg | Freq: Once | INTRAMUSCULAR | Status: AC
  Administered 2018-02-08: 1 mg via INTRAVENOUS
  Filled 2018-02-08: qty 1

## 2018-02-08 MED ORDER — GABAPENTIN 300 MG PO CAPS
300.0000 mg | ORAL_CAPSULE | Freq: Three times a day (TID) | ORAL | Status: DC
  Administered 2018-02-09 – 2018-02-12 (×11): 300 mg via ORAL
  Filled 2018-02-08 (×11): qty 1

## 2018-02-08 MED ORDER — ACETAMINOPHEN 325 MG PO TABS
650.0000 mg | ORAL_TABLET | Freq: Once | ORAL | Status: AC
  Administered 2018-02-08: 650 mg via ORAL
  Filled 2018-02-08: qty 2

## 2018-02-08 MED ORDER — SODIUM CHLORIDE 0.9 % IV SOLN: 2.0000 g | Freq: Once | INTRAVENOUS | Status: DC

## 2018-02-08 MED ORDER — AMLODIPINE BESYLATE 5 MG PO TABS
5.0000 mg | ORAL_TABLET | Freq: Every day | ORAL | Status: DC
  Administered 2018-02-09 – 2018-02-12 (×4): 5 mg via ORAL
  Filled 2018-02-08 (×4): qty 1

## 2018-02-08 MED ORDER — SODIUM POLYSTYRENE SULFONATE PO POWD
15.0000 g | Freq: Once | ORAL | Status: AC
  Administered 2018-02-09: 15 g via ORAL
  Filled 2018-02-08: qty 15

## 2018-02-08 MED ORDER — VANCOMYCIN VARIABLE DOSE PER UNSTABLE RENAL FUNCTION (PHARMACIST DOSING): Status: DC

## 2018-02-08 NOTE — Progress Notes (Signed)
Pharmacy Antibiotic Note  [name] is a 67 y.o. male admitted on 02/08/2018 with sepsis from unkown source.  Pharmacy has been consulted for vancomycin and cefepime dosing. Patient has a h/o active cocaine abuse. Tm 101.27F. WBC 19.4. SCr 5.11 (BL ~ 3.6). CrCl ~ 10-15 mL/min.   Plan: -Vancomycin 1500 mg IV once, then will dose vancomycin per levels  -Cefepime 1 gm IV Q 24 hours -Monitor CBC, renal fx, cultures and clinical progress -VT as necessary   Height: 6\' 1"  (185.4 cm) Weight: 177 lb 14.6 oz (80.7 kg) IBW/kg (Calculated) : 79.9  Temp (24hrs), Avg:100.9 F (38.3 C), Min:100.2 F (37.9 C), Max:101.5 F (38.6 C)  Recent Labs  Lab 02/08/18 1845  WBC 19.4*  CREATININE 5.11*    Estimated Creatinine Clearance: 15.4 mL/min (A) (by C-G formula based on SCr of 5.11 mg/dL (H)).    Allergies  Allergen Reactions  . Morphine And Related Itching    Antimicrobials this admission: Vanc 9/6 >>  Cefepime 9/6 >>   Dose adjustments this admission: None  Microbiology results:   Thank you for allowing pharmacy to be a part of this patient's care.  Albertina Parr, PharmD., BCPS Clinical Pharmacist Clinical phone for 02/08/18 until 11pm: [phone]

## 2018-02-08 NOTE — ED Provider Notes (Addendum)
[facility] EMERGENCY DEPARTMENT Provider Note   CSN: [id number] Arrival date & time: 02/08/18  1752     History   Chief Complaint Chief Complaint  Patient presents with  . Shortness of Breath    HPI [name] is a 67 y.o. male.  HPI  67 yo male with ss disease, esrd, diabetes presents today with onset of leg pain and abdominal pain today.  She used cocaine last night states began with leg pain early am, then pain abdomen.  Worse in back.  Took tylenol without relief.  Subjective fever,sweaty, denies chills.  SOB due to pain in abdomen with breathing, no cough.  No nausea, vomiting, olr diarrhea.  States took po today   Usually take hydrocodone and phenergan for pain.  No rashes.  BS was 199 at home   Past Medical History:  Diagnosis Date  . Arthritis   . Cancer ([place])    colon  . Diabetes mellitus   . GERD (gastroesophageal reflux disease)   . Heart murmur   . Hypertension   . Pneumonia   . Renal insufficiency   . Sickle cell anemia (HCC)    Hgb SS disease   . Wears glasses     Patient Active Problem List   Diagnosis Date Noted  . Opiate overdose ([place]) 11/13/2017  . Acute urinary obstruction 03/29/2017  . Microcytic anemia   . Hb-SS disease with crisis ([place]) 03/27/2017  . Heart murmur 03/27/2016  . CKD (chronic kidney disease) stage 5, GFR less than 15 ml/min (HCC) 03/26/2016  . Hb-SS disease without crisis ([place])   . Hyperkalemia, diminished renal excretion 09/03/2014  . Leukocytosis 07/28/2011  . LBBB (left bundle branch block) 07/28/2011  . Hypertension   . Type 2 diabetes mellitus with renal complication [facility])     Past Surgical History:  Procedure Laterality Date  . AV FISTULA PLACEMENT Left 11/22/2016   Procedure: INSERTION OF ARTERIOVENOUS (AV) GORE-TEX GRAFT  LEFT FOREARM USING 6MM X 50CM GORETEX GRAFT;  Surgeon: Serafina Mitchell, MD;  Location: [facility];  Service: Vascular;  Laterality: Left;  . CATARACT EXTRACTION W/ INTRAOCULAR  LENS  IMPLANT, BILATERAL    . CHOLECYSTECTOMY    . COLON SURGERY     Colon cancer surgery, removed small amt not full colectomy  . COLONOSCOPY     nclear who follows him for this  . LEG SURGERY     For ? gangrene after running into barbwire fence at 67yo        Home Medications    Prior to Admission medications   Medication Sig Start Date End Date Taking? Authorizing Provider  acetaminophen (TYLENOL) 500 MG tablet Take 500 mg by mouth every 6 (six) hours as needed for headache (pain).    [provider]  amLODipine (NORVASC) 5 MG tablet Take 5 mg by mouth daily. 10/04/17   [provider]  carvedilol (COREG) 25 MG tablet Take 25 mg by mouth 2 (two) times daily. 09/04/17   [provider]  Docusate Calcium (STOOL SOFTENER PO) Take 1 capsule by mouth daily as needed (constipation).    [provider]  folic acid (FOLVITE) 1 MG tablet Take 1 tablet (1 mg total) by mouth daily. 04/18/13   Robbie Lis, MD  furosemide (LASIX) 80 MG tablet Take 0.5 tablets (40 mg total) by mouth daily as needed for edema (Weight more than 2 pounds above 177 pounds). 04/02/17   Leana Gamer, MD  gabapentin (NEURONTIN)  300 MG capsule TAKE 1 (ONE) CAPSULE AT BEDTIME 10/25/17   [provider]  glipiZIDE (GLUCOTROL XL) 10 MG 24 hr tablet Take 10 mg by mouth 2 (two) times daily. 10/22/17   [provider]  insulin aspart (NOVOLOG FLEXPEN) 100 UNIT/ML FlexPen Inject 2-6 Units into the skin 3 (three) times daily as needed for high blood sugar (CBG >150).    [provider]  insulin glargine (LANTUS) 100 unit/mL SOPN Inject 20 Units into the skin at bedtime.     [provider]  omeprazole (PRILOSEC) 20 MG capsule Take 20 mg by mouth daily.     [provider]  polyethylene glycol powder (MIRALAX) powder Take 17 g by mouth daily. Patient taking differently: Take 17 g by mouth daily as needed (constipation). Mix in 8 oz liquid and  drink 10/28/16   Palumbo, April, MD  saxagliptin HCl (ONGLYZA) 5 MG TABS tablet Take 0.5 tablets (2.5 mg total) by mouth daily. 12/29/16   Mariel Aloe, MD  Sennosides (SENNA LAXATIVE PO) Take 1 tablet by mouth daily as needed (constipation).    [provider]  sodium polystyrene (KAYEXALATE) 15 GM/60ML suspension 30 grams 2 x week on Monday and Friday. Next dose due on Friday. Patient taking differently: Take 15 g by mouth once.  04/02/17   Leana Gamer, MD  tamsulosin (FLOMAX) 0.4 MG CAPS capsule Take 1 capsule (0.4 mg total) by mouth daily. 04/03/17   Leana Gamer, MD  tiZANidine (ZANAFLEX) 4 MG tablet Take 4 mg by mouth 2 (two) times daily as needed for muscle spasms.  09/24/17   [provider]  VENTOLIN HFA 108 (90 Base) MCG/ACT inhaler INHALE 2 PUFFS BY MOUTH 4 TIMES A DAY AS NEEDED FOR SOB AND WHEEZING 09/03/17   [provider]    Family History Family History  Problem Relation Age of Onset  . Venous thrombosis Mother        Dec. of blood clot   . Diabetes type II Mother   . AAA (abdominal aortic aneurysm) Mother   . Throat cancer Father        Deceased of throat cancer    Social History Social History   Tobacco Use  . Smoking status: Former Smoker    Packs/day: 0.50    Years: 20.00    Pack years: 10.00    Last attempt to quit: 06/05/1966    Years since quitting: 51.7  . Smokeless tobacco: Never Used  Substance Use Topics  . Alcohol use: Yes    Comment: rare beer  . Drug use: Yes    Types: Cocaine    Comment: pt was clean for 2 years and started back recently.      Allergies   Morphine and related   Review of Systems Review of Systems  All other systems reviewed and are negative.    Physical Exam Updated Vital Signs BP (!) 161/85   Pulse 85   Temp 100.2 F (37.9 C) (Oral)   Resp 20   Ht 1.854 m (6\' 1" )   Wt 80.7 kg   SpO2 100%   BMI 23.48 kg/m   Physical Exam  Constitutional: He is oriented to person,  place, and time. He appears well-developed and well-nourished.  HENT:  Head: Normocephalic and atraumatic.  Mouth/Throat: Oropharynx is clear and moist.  Eyes: Pupils are equal, round, and reactive to light. EOM are normal.  Neck: Normal range of motion.  Cardiovascular: Normal rate and regular rhythm.  Pulmonary/Chest: Effort normal and breath sounds normal. Tachypnea noted.  Abdominal: He exhibits distension. There is tenderness.  Musculoskeletal: Normal range of motion.       Right lower leg: He exhibits edema.       Left lower leg: He exhibits edema.  Neurological: He is alert and oriented to person, place, and time.  Skin: Skin is warm and dry. Capillary refill takes less than 2 seconds.  Psychiatric: He has a normal mood and affect. His behavior is normal.  Nursing note and vitals reviewed.    ED Treatments / Results  Labs (all labs ordered are listed, but only abnormal results are displayed) Labs Reviewed  COMPREHENSIVE METABOLIC PANEL  CBC WITH DIFFERENTIAL/PLATELET  I-STAT TROPONIN, ED    EKG None ED ECG REPORT   Date: 02/08/2018  Rate: 80  Rhythm: normal sinus rhythm  QRS Axis: right  Intervals: normal  ST/T Wave abnormalities: nonspecific ST changes  Conduction Disutrbances:nonspecific intraventricular conduction delay  Narrative Interpretation:   Old EKG Reviewed: unchanged  I have personally reviewed the EKG tracing and agree with the computerized printout as noted.  Radiology Ct Abdomen Pelvis Wo Contrast  Result Date: 02/08/2018 CLINICAL DATA:  Abdominal distension.  History of colon cancer. EXAM: CT ABDOMEN AND PELVIS WITHOUT CONTRAST TECHNIQUE: Multidetector CT imaging of the abdomen and pelvis was performed following the standard protocol without IV contrast. COMPARISON:  11/04/2016 FINDINGS: Lower chest: Scarring in the anterior aspect of the right middle lobe and posterolateral aspect of the left lower lobe. Hepatobiliary: No focal liver abnormality  is seen. Status post cholecystectomy. No biliary dilatation. Pancreas: Unremarkable. No pancreatic ductal dilatation or surrounding inflammatory changes. Spleen: Contracted calcified spleen, chronic finding. Adrenals/Urinary Tract: Adrenal glands are unremarkable. Kidneys are normal, without renal calculi, focal lesion, or hydronephrosis. Bladder is markedly distended. Stomach/Bowel: Questionable mucosal thickening versus formed stool adherent to the walls of the cecum. The bowel is suboptimally visualized due to lack of opacification. Vascular/Lymphatic: Enlarged lymph nodes along the right iliac chain and posterior to the cecum, the largest measuring 2 cm Reproductive: The prostate gland is normal in size. Other: No abdominal wall hernia or abnormality. No abdominopelvic ascites. Musculoskeletal: Markedly heterogeneous sclerotic appearance of the osseous structures. IMPRESSION: Interval development of pathologically enlarged lymph nodes in the right retroperitoneum along the right iliac chain and posterior to the cecum. Questionable mucosal thickening versus adherent stool to the walls of the cecum. Marked dilation of the urinary bladder. Please correlate clinically. Marked heterogeneous mottled sclerotic appearance of the osseous structures. This may be due to sickle cell disease or represents diffuse metastatic disease. Electronically Signed   By: Fidela Salisbury M.D.   On: 02/08/2018 20:52   Dg Chest Port 1 View  Result Date: 02/08/2018 CLINICAL DATA:  Shortness of breath. EXAM: PORTABLE CHEST 1 VIEW COMPARISON:  One-view chest x-Anastashia Westerfeld 11/13/2017 FINDINGS: Heart is enlarged. Mild pulmonary vascular congestion is present. No focal airspace disease present. There are no definite effusions. Mild degenerative changes are present at the shoulders bilaterally. Sclerotic changes are present at both humeral heads, compatible with known sickle cell disease. IMPRESSION: 1. Cardiomegaly and moderate pulmonary  vascular congestion without frank edema. 2. No focal airspace disease. 3. Sclerotic changes at the humeral heads compatible with sickle cell disease. Electronically Signed   By: San Morelle M.D.   On: 02/08/2018 19:23    Procedures .Critical Care Performed by: Pattricia Boss, MD Authorized by: Pattricia Boss, MD   Critical care provider statement:    Critical  care time (minutes):  45   Critical care end time:  02/08/2018 9:49 PM   Critical care was time spent personally by me on the following activities:  Discussions with consultants, evaluation of patient's response to treatment, examination of patient, ordering and performing treatments and interventions, ordering and review of laboratory studies, ordering and review of radiographic studies, pulse oximetry, re-evaluation of patient's condition, obtaining history from patient or surrogate and review of old charts   (including critical care time)  Medications Ordered in ED Medications - No data to display   Initial Impression / Assessment and Plan / ED Course  I have reviewed the triage vital signs and the nursing notes.  Pertinent labs & imaging results that were available during my care of the patient were reviewed by me and considered in my medical decision making (see chart for details).     8:11 PM Patient in CT Awaiting rectal temp CBC and chemistries resulted White count elevated 19,400 Patient anemic with hemoglobin 7.7 Potassium elevated at 5.5 Creatinine elevated at 5.11-patient with end-stage renal disease and has had elevated creatinine before this first with most recent 3.6  Broad spectrum antibiotics initiated Patient being hydrated Discussed with Dr.Garba and will see for admission.  Final Clinical Impressions(s) / ED Diagnoses   Final diagnoses:  Sepsis, due to unspecified organism ([place])  Sickle cell pain crisis ([place])  Hyperkalemia  Acute renal failure superimposed on chronic kidney disease,  unspecified CKD stage, unspecified acute renal failure type [facility])      ED Discharge Orders    None       Pattricia Boss, MD 02/08/18 2149    Pattricia Boss, MD 02/08/18 2150

## 2018-02-08 NOTE — ED Notes (Signed)
Asked Dr. Jeanell Sparrow to place ultrasound IV for additional access.

## 2018-02-08 NOTE — ED Notes (Signed)
Called main lab to make sure retic count was added on and being processed

## 2018-02-08 NOTE — H&P (Signed)
History and Physical   [name] WNU:[id number] DOB: Aug 02, 1950 DOA: 02/08/2018  Referring MD/NP/PA: Dr. Jeanell Sparrow  PCP: Nolene Ebbs, MD   Outpatient Specialists: Dr. Marval Regal  Patient coming from: Home  Chief Complaint: Abdominal pain  HPI: [name] is a 67 y.o. male with medical history significant of sickle cell disease, diabetes, chronic kidney disease stage IV, recent vocal cord lesion with dysphonia, polysubstance abuse, GERD and hypertension who came to the ER today with abdominal pain as well as leg pain.  Also chest pain or shortness of breath.  Patient's pain is nonspecific.  He is here with his ex-wife who is also significant other reported that he used cocaine last night and then started having leg pain earlier today.  This has progressed to abdominal pain going to his back.  Patient took Tylenol without relief.  Pain is not consistent with his typical sickle cell pain.  Patient has chronic kidney disease stage V but not yet on hemodialysis.  He was evaluated in the ER and found to have sepsis by criteria, acute on chronic kidney failure as well as leukocytosis and evidence of dehydration.  Patient is being admitted with early sepsis.  No obvious cause yet.  No obvious active sickle cell crisis although patient may go into crisis.  ED Course: Temperature is 101.5, blood pressure 174/97, pulse 88, respiratory 25, oxygen sats of 92% room air.  White count is 19.4 hemoglobin 7.7 with platelet 362.  Sodium 127 potassium 5.5, chloride 98, CO2 16, BUN 55 creatinine 5.11.  Calcium 9.3 glucose 128.  Lactic acid is currently pending.  Chest x-ray showed no active findings.  Review of Systems: As per HPI otherwise 10 point review of systems negative.    Past Medical History:  Diagnosis Date  . Arthritis   . Cancer ([place])    colon  . Diabetes mellitus   . GERD (gastroesophageal reflux disease)   . Heart murmur   . Hypertension   . Pneumonia   . Renal insufficiency   . Sickle  cell anemia (HCC)    Hgb SS disease   . Wears glasses     Past Surgical History:  Procedure Laterality Date  . AV FISTULA PLACEMENT Left 11/22/2016   Procedure: INSERTION OF ARTERIOVENOUS (AV) GORE-TEX GRAFT  LEFT FOREARM USING 6MM X 50CM GORETEX GRAFT;  Surgeon: Serafina Mitchell, MD;  Location: [facility];  Service: Vascular;  Laterality: Left;  . CATARACT EXTRACTION W/ INTRAOCULAR LENS  IMPLANT, BILATERAL    . CHOLECYSTECTOMY    . COLON SURGERY     Colon cancer surgery, removed small amt not full colectomy  . COLONOSCOPY     nclear who follows him for this  . LEG SURGERY     For ? gangrene after running into barbwire fence at 67yo     reports that he quit smoking about 51 years ago. He has a 10.00 pack-year smoking history. He has never used smokeless tobacco. He reports that he drinks alcohol. He reports that he has current or past drug history. Drug: Cocaine.  Allergies  Allergen Reactions  . Morphine And Related Itching    Family History  Problem Relation Age of Onset  . Venous thrombosis Mother        Dec. of blood clot   . Diabetes type II Mother   . AAA (abdominal aortic aneurysm) Mother   . Throat cancer Father        Deceased of throat cancer  Prior to Admission medications   Medication Sig Start Date End Date Taking? Authorizing Provider  acetaminophen (TYLENOL) 500 MG tablet Take 500 mg by mouth every 6 (six) hours as needed for headache (pain).    [provider]  amLODipine (NORVASC) 5 MG tablet Take 5 mg by mouth daily. 10/04/17   [provider]  carvedilol (COREG) 25 MG tablet Take 25 mg by mouth 2 (two) times daily. 09/04/17   [provider]  Docusate Calcium (STOOL SOFTENER PO) Take 1 capsule by mouth daily as needed (constipation).    [provider]  folic acid (FOLVITE) 1 MG tablet Take 1 tablet (1 mg total) by mouth daily. 04/18/13   Robbie Lis, MD  furosemide (LASIX) 80 MG tablet Take 0.5 tablets (40 mg total) by  mouth daily as needed for edema (Weight more than 2 pounds above 177 pounds). 04/02/17   Leana Gamer, MD  gabapentin (NEURONTIN) 300 MG capsule TAKE 1 (ONE) CAPSULE AT BEDTIME 10/25/17   [provider]  glipiZIDE (GLUCOTROL XL) 10 MG 24 hr tablet Take 10 mg by mouth 2 (two) times daily. 10/22/17   [provider]  insulin aspart (NOVOLOG FLEXPEN) 100 UNIT/ML FlexPen Inject 2-6 Units into the skin 3 (three) times daily as needed for high blood sugar (CBG >150).    [provider]  insulin glargine (LANTUS) 100 unit/mL SOPN Inject 20 Units into the skin at bedtime.     [provider]  omeprazole (PRILOSEC) 20 MG capsule Take 20 mg by mouth daily.     [provider]  polyethylene glycol powder (MIRALAX) powder Take 17 g by mouth daily. Patient taking differently: Take 17 g by mouth daily as needed (constipation). Mix in 8 oz liquid and drink 10/28/16   Palumbo, April, MD  saxagliptin HCl (ONGLYZA) 5 MG TABS tablet Take 0.5 tablets (2.5 mg total) by mouth daily. 12/29/16   Mariel Aloe, MD  Sennosides (SENNA LAXATIVE PO) Take 1 tablet by mouth daily as needed (constipation).    [provider]  sodium polystyrene (KAYEXALATE) 15 GM/60ML suspension 30 grams 2 x week on Monday and Friday. Next dose due on Friday. Patient taking differently: Take 15 g by mouth once.  04/02/17   Leana Gamer, MD  tamsulosin (FLOMAX) 0.4 MG CAPS capsule Take 1 capsule (0.4 mg total) by mouth daily. 04/03/17   Leana Gamer, MD  tiZANidine (ZANAFLEX) 4 MG tablet Take 4 mg by mouth 2 (two) times daily as needed for muscle spasms.  09/24/17   [provider]  VENTOLIN HFA 108 (90 Base) MCG/ACT inhaler INHALE 2 PUFFS BY MOUTH 4 TIMES A DAY AS NEEDED FOR SOB AND WHEEZING 09/03/17   [provider]    Physical Exam: Vitals:   02/08/18 2030 02/08/18 2032 02/08/18 2100 02/08/18 2130  BP: (!) 158/85  (!) 159/83 (!) 146/80  Pulse: 86  88  80  Resp: 19  18 17   Temp:  (!) 101.5 F (38.6 C)    TempSrc:  Rectal    SpO2: 95%  92% 94%  Weight:      Height:          Constitutional: NAD, calm, comfortable Vitals:   02/08/18 2030 02/08/18 2032 02/08/18 2100 02/08/18 2130  BP: (!) 158/85  (!) 159/83 (!) 146/80  Pulse: 86  88 80  Resp: 19  18 17   Temp:  (!) 101.5 F (38.6 C)    TempSrc:  Rectal  SpO2: 95%  92% 94%  Weight:      Height:       Eyes: PERRL, lids and conjunctivae normal ENMT: Mucous membranes are moist. Posterior pharynx clear of any exudate or lesions.Normal dentition.  Neck: normal, supple, no masses, no thyromegaly Respiratory: clear to auscultation bilaterally, no wheezing, no crackles. Normal respiratory effort. No accessory muscle use.  Cardiovascular: Regular rate and rhythm, no murmurs / rubs / gallops. No extremity edema. 2+ pedal pulses. No carotid bruits.  Abdomen: no tenderness, no masses palpated. No hepatosplenomegaly. Bowel sounds positive.  Musculoskeletal: no clubbing / cyanosis. No joint deformity upper and lower extremities. Good ROM, no contractures. Normal muscle tone.  Skin: no rashes, lesions, ulcers. No induration Neurologic: CN 2-12 grossly intact. Sensation intact, DTR normal. Strength 5/5 in all 4.  Psychiatric: Normal judgment and insight. Alert and oriented x 3. Normal mood.     Labs on Admission: I have personally reviewed following labs and imaging studies  CBC: Recent Labs  Lab 02/08/18 1845  WBC 19.4*  NEUTROABS 15.2*  HGB 7.7*  HCT 24.1*  MCV 68.5*  PLT 914   Basic Metabolic Panel: Recent Labs  Lab 02/08/18 1845  NA 127*  K 5.5*  CL 98  CO2 16*  GLUCOSE 128*  BUN 55*  CREATININE 5.11*  CALCIUM 9.3   GFR: Estimated Creatinine Clearance: 15.4 mL/min (A) (by C-G formula based on SCr of 5.11 mg/dL (H)). Liver Function Tests: Recent Labs  Lab 02/08/18 1845  AST 35  ALT 15  ALKPHOS 104  BILITOT 1.6*  PROT 8.2*  ALBUMIN 4.0   No results for  input(s): LIPASE, AMYLASE in the last 168 hours. No results for input(s): AMMONIA in the last 168 hours. Coagulation Profile: No results for input(s): INR, PROTIME in the last 168 hours. Cardiac Enzymes: No results for input(s): CKTOTAL, CKMB, CKMBINDEX, TROPONINI in the last 168 hours. BNP (last 3 results) No results for input(s): PROBNP in the last 8760 hours. HbA1C: No results for input(s): HGBA1C in the last 72 hours. CBG: No results for input(s): GLUCAP in the last 168 hours. Lipid Profile: No results for input(s): CHOL, HDL, LDLCALC, TRIG, CHOLHDL, LDLDIRECT in the last 72 hours. Thyroid Function Tests: No results for input(s): TSH, T4TOTAL, FREET4, T3FREE, THYROIDAB in the last 72 hours. Anemia Panel: No results for input(s): VITAMINB12, FOLATE, FERRITIN, TIBC, IRON, RETICCTPCT in the last 72 hours. Urine analysis:    Component Value Date/Time   COLORURINE YELLOW 11/13/2017 1826   APPEARANCEUR CLEAR 11/13/2017 1826   LABSPEC 1.012 11/13/2017 1826   PHURINE 5.0 11/13/2017 1826   GLUCOSEU NEGATIVE 11/13/2017 1826   HGBUR NEGATIVE 11/13/2017 1826   BILIRUBINUR NEGATIVE 11/13/2017 1826   KETONESUR NEGATIVE 11/13/2017 1826   PROTEINUR 30 (A) 11/13/2017 1826   UROBILINOGEN 1.0 09/06/2014 2203   NITRITE NEGATIVE 11/13/2017 1826   LEUKOCYTESUR NEGATIVE 11/13/2017 1826   Sepsis Labs: @LABRCNTIP (procalcitonin:4,lacticidven:4) )No results found for this or any previous visit (from the past 240 hour(s)).   Radiological Exams on Admission: Ct Abdomen Pelvis Wo Contrast  Result Date: 02/08/2018 CLINICAL DATA:  Abdominal distension.  History of colon cancer. EXAM: CT ABDOMEN AND PELVIS WITHOUT CONTRAST TECHNIQUE: Multidetector CT imaging of the abdomen and pelvis was performed following the standard protocol without IV contrast. COMPARISON:  11/04/2016 FINDINGS: Lower chest: Scarring in the anterior aspect of the right middle lobe and posterolateral aspect of the left lower lobe.  Hepatobiliary: No focal liver abnormality is seen. Status post cholecystectomy. No biliary  dilatation. Pancreas: Unremarkable. No pancreatic ductal dilatation or surrounding inflammatory changes. Spleen: Contracted calcified spleen, chronic finding. Adrenals/Urinary Tract: Adrenal glands are unremarkable. Kidneys are normal, without renal calculi, focal lesion, or hydronephrosis. Bladder is markedly distended. Stomach/Bowel: Questionable mucosal thickening versus formed stool adherent to the walls of the cecum. The bowel is suboptimally visualized due to lack of opacification. Vascular/Lymphatic: Enlarged lymph nodes along the right iliac chain and posterior to the cecum, the largest measuring 2 cm Reproductive: The prostate gland is normal in size. Other: No abdominal wall hernia or abnormality. No abdominopelvic ascites. Musculoskeletal: Markedly heterogeneous sclerotic appearance of the osseous structures. IMPRESSION: Interval development of pathologically enlarged lymph nodes in the right retroperitoneum along the right iliac chain and posterior to the cecum. Questionable mucosal thickening versus adherent stool to the walls of the cecum. Marked dilation of the urinary bladder. Please correlate clinically. Marked heterogeneous mottled sclerotic appearance of the osseous structures. This may be due to sickle cell disease or represents diffuse metastatic disease. Electronically Signed   By: Fidela Salisbury M.D.   On: 02/08/2018 20:52   Dg Chest Port 1 View  Result Date: 02/08/2018 CLINICAL DATA:  Shortness of breath. EXAM: PORTABLE CHEST 1 VIEW COMPARISON:  One-view chest x-ray 11/13/2017 FINDINGS: Heart is enlarged. Mild pulmonary vascular congestion is present. No focal airspace disease present. There are no definite effusions. Mild degenerative changes are present at the shoulders bilaterally. Sclerotic changes are present at both humeral heads, compatible with known sickle cell disease. IMPRESSION: 1.  Cardiomegaly and moderate pulmonary vascular congestion without frank edema. 2. No focal airspace disease. 3. Sclerotic changes at the humeral heads compatible with sickle cell disease. Electronically Signed   By: San Morelle M.D.   On: 02/08/2018 19:23    EKG: Independently reviewed.  It shows sinus rhythm with a rate of 92, left bundle branch block which is not new.  Assessment/Plan Principal Problem:   Renal failure (ARF), acute on chronic (HCC) Active Problems:   Type 2 diabetes mellitus with renal complication (HCC)   Leukocytosis   Hyperkalemia, diminished renal excretion   Cocaine abuse (HCC)   CKD (chronic kidney disease) stage 5, GFR less than 15 ml/min (HCC)   Sepsis ([place])     #1 sepsis: Patient has symptoms of sepsis with no obvious source yet.  His cocaine use may be a trigger.  Admit the patient and initiate sepsis protocol with antibiotics.  Gentle hydration due to kidney disease.  Place patient on stepdown monitor closely.  #2 abdominal pain: CT abdomen pelvis shows pathologically enlarged lymph nodes in the right retroperitoneum along the right iliac chain and posterior to the cecum.  Cecal walls appears to be thickened with marked dilatation of the urinary bladder.  No obvious cause of his pain besides these.  No colitis or enteritis.  We will treat therefore empirically.  Worrisome for ischemic colitis from cocaine use but no obvious evidence from CT  #3 reported cocaine abuse: Drug screen will be attempted but patient is not having any urine at the moment.  Will attempt to collect it if possible.  #4 acute on chronic kidney disease stage V: Patient baseline creatinine is between 3 and 4.  Probably volume contracted despite high blood pressure.  We will hydrate slightly.  Monitor renal function.  Consider nephrology consultation in the morning.  #5 sickle cell disease: No obvious crisis at the moment.  Continue home regimen.  Will initial IV Dilaudid if crisis  starts  #6 diabetes: Patient  on insulin and glipizide.  Sliding scale insulin to be initiated.  Also home regimen.  #7 hyperkalemia: Probably as a result of his renal insufficiency.  It is borderline but follow closely.  Kayexalate if potassium continues to be elevated.  #8 chest pain: Nonspecific.  Patient has had previous work-up including stress test.  Cycle enzymes and follow closely on telemetry.    DVT prophylaxis: Heparin Code Status: Full code Family Communication: Ex-wife and significant other with him Disposition Plan: Home Consults called: None but nephrology can be called in the morning Admission status: Inpatient  Severity of Illness: The appropriate patient status for this patient is INPATIENT. Inpatient status is judged to be reasonable and necessary in order to provide the required intensity of service to ensure the patient's safety. The patient's presenting symptoms, physical exam findings, and initial radiographic and laboratory data in the context of their chronic comorbidities is felt to place them at high risk for further clinical deterioration. Furthermore, it is not anticipated that the patient will be medically stable for discharge from the hospital within 2 midnights of admission. The following factors support the patient status of inpatient.   " The patient's presenting symptoms include abdominal pain, chest pain and nonspecific complaint. " The worrisome physical exam findings include no significant findings on exam. " The initial radiographic and laboratory data are worrisome because of evidence of sepsis by criteria. " The chronic co-morbidities include chronic kidney disease stage V as well as sickle cell disease.   * I certify that at the point of admission it is my clinical judgment that the patient will require inpatient hospital care spanning beyond 2 midnights from the point of admission due to high intensity of service, high risk for further deterioration  and high frequency of surveillance required.Barbette Merino MD [facility] Pager [phone]  If 7PM-7AM, please contact night-coverage [email or web address] Password [facility]  02/08/2018, 9:43 PM

## 2018-02-08 NOTE — ED Notes (Signed)
Report attempted 

## 2018-02-08 NOTE — ED Provider Notes (Signed)
Patient placed in Quick Look pathway, seen and evaluated   Chief Complaint: Chest pain, shortness of breath  HPI:   Patient with a past medical history of sickle cell, CKD presents today for evaluation of shortness of breath.  He reports that his symptoms started today and that his abdomen feels tight and painful.  He has dialysis access in his left side for CKD 4, however has not started dialysis yet.  He was scheduled to have surgery, however that was removed as he has a mass on the vocal cord.  ROS: Ex-wife, who is with patient, reports that his speech is at his normal baseline  Physical Exam:   Gen: No distress  Neuro: Awake and Alert  Skin: Warm    Focused Exam: Few's rhonchi/rales bilaterally.     Initiation of care has begun. The patient has been counseled on the process, plan, and necessity for staying for the completion/evaluation, and the remainder of the medical screening examination    Ollen Gross 02/08/18 1818    Pattricia Boss, MD 02/14/18 1404

## 2018-02-08 NOTE — ED Triage Notes (Signed)
Pt c/o SOB. Pt abdomen is tight, he has dialysis access on left side for stage 4 CKD, pt has not started dialysis yet.

## 2018-02-08 NOTE — Progress Notes (Addendum)
Anesthesia Chart Review: SAME DAY WORK-UP  Case:  [id number] Date/Time:  02/11/18 0845   Procedure:  MICROLARYNGOSCOPY WITH CO2 laser excision/small laser-safe endotracheal tube (N/A )   Anesthesia type:  General   Pre-op diagnosis:  vocal cord lesion   Location:  [facility] OR ROOM 08 / [facility]   Surgeon:  Melida Quitter, MD      DISCUSSION: Patient is a 67 year old male scheduled for the above procedure. He was recently evaluated for dysphonia. Fiberoptic exam on 01/02/18 showed "a pedunculated lesion on the left anterior vocal cord. The lesion may be neoplastic." The above procedure recommended.    History includes former smoker (quit '68), Sickle cell anemia (Hgb SS disease), DM2, CKD V (s/p left FA AVGG 11/22/16), HTN, left BBB, murmur (mild MR/TR), colon cancer (s/p colon surgery).  History cocaine use (last UDS + 03/25/16), he reported last cocaine use 04/2016. Discharge summary from 03/2017 also notes chronic leukocytosis with baseline WBCs elevated ~ 15-17K--peak of 32K during that admission and inially treated with Zosyn, but discontinued due to no source of infection. Worsening leukocytosis felt due to hemo-concentration related to dehydration.   - Admission 11/13/17-11/16/17 for opiate overdose (felt unintentional, most likely due to advanced renal disease). Treated with Narcan. Given kayexalate for hyperkalemia.  - Admission 03/26/17-04/02/17 for acute on chronic kidney disease, acute urinary retention with urinary obstruction (foley placed, Flomax started, out-patient urology f/u), Sickle cell crisis, leukocytosis.  He is for routine labs on the day of surgery. Defer decision for UDS to anesthesiologist, but by current records, last cocaine use was in 2017. He as Sickle cell Hb-SS disease, so will need to stay well hydrated--he is a morning case. Anesthesiologist to evaluate on the day of surgery.    PROVIDERS: - PCP is Dr. Nolene Ebbs. - Nephrologist is Dr. Donato Heinz. - He last saw a  cardiologist (Dr. Debara Pickett) during 03/2016 admission for chest pain. Seen stress/echo reports below.   LABS: He is a same day work-up, so labs pending day of surgery.    IMAGES: DG abdomen acute with 1V CXR 11/13/17: FINDINGS: - The semi upright AP chest x-ray demonstrates cardiac enlargement and vascular congestion without overt pulmonary edema or definite infiltrates. Bony changes of sickle cell disease noted. - Two views of the abdomen demonstrate an unremarkable bowel gas pattern. No findings to suggest obstruction or perforation. The soft tissue shadows are grossly maintained. Small calcified spleen is noted in the left upper quadrant. Surgical clips in the right upper quadrant from a previous cholecystectomy. Extensive bony changes of sickle cell disease. IMPRESSION: No plain film findings for an acute abdominal process.   EKG: 11/16/17: NSR, left BBB. No significant change since last tracing. (New left BBB diagnosed 09/2008 in the setting of chest pain and recent cocaine use. He was seen by cardiologist Dr. Phill Mutter. Troponin negative. 09/10/08 echo showed no wall motion abnormalities. Chest pain was felt due to recent cocaine use.)   CV: Echo 03/27/17: Study Conclusions - Left ventricle: The cavity size was normal. There was moderate   concentric hypertrophy. Systolic function was normal. The   estimated ejection fraction was in the range of 55% to 60%. Wall   motion was normal; there were no regional wall motion   abnormalities. There was an increased relative contribution of   atrial contraction to ventricular filling. Doppler parameters are   consistent with abnormal left ventricular relaxation (grade 1   diastolic dysfunction). Doppler parameters are consistent with   high  ventricular filling pressure. - Aortic valve: Valve area (Vmax): 2.69 cm^2. - Mitral valve: Calcified annulus. There was mild regurgitation. - Right ventricle: The cavity size was moderately  dilated. Wall   thickness was normal. - Right atrium: The atrium was mildly dilated. - Pulmonary artery: Systolic pressure could not be accurately measured. (Comparison echo 11/10/16: EF 60-65%, normal wall motion, no regional wall motion abnormalities, grade 2 diastolic dysfunction, mild TR, PA peak pressure: 38 mm Hg.)  Nuclear stress test 03/26/16: IMPRESSION: 1. Large fixed defect of the septum and inferior walls compatible with remote infarcts/scar. No significant inducible ischemia with pharmacologic stress. 2. Inferior hypokinesis and septal dyskinesis. 3. Left ventricular ejection fraction 55% 4. Non invasive risk stratification*: High (Test done in the setting of chest pain with recent cocaine use. Troponin negative X 3. Dr. Debara Pickett wrote, "Pt. Seen and examined. Agree with the NP/PA-C note as written. Large fixed inferior wall defect with normal LVEF and inferior hypokinesis, may be scar or artifact. This is NOT a high-risk study, would be intermediate risk. Plan to treat medically at this time and continue to work on lifestyle modification. BP remains elevated - increase hydralazine. Echo pending, however, LVEF appears normal. H/H very low yesterday at 6/18 - may need transfusion (suspect result of SS anemia) - STAT H/H pending. Defer to medicine on this.")  Past Medical History:  Diagnosis Date  . Arthritis   . Cancer ([place])    colon  . Diabetes mellitus   . GERD (gastroesophageal reflux disease)   . Heart murmur   . Hypertension   . Pneumonia   . Renal insufficiency   . Sickle cell anemia (HCC)    Hgb SS disease   . Wears glasses     Past Surgical History:  Procedure Laterality Date  . AV FISTULA PLACEMENT Left 11/22/2016   Procedure: INSERTION OF ARTERIOVENOUS (AV) GORE-TEX GRAFT  LEFT FOREARM USING 6MM X 50CM GORETEX GRAFT;  Surgeon: Serafina Mitchell, MD;  Location: [place];  Service: Vascular;  Laterality: Left;  . CATARACT EXTRACTION W/ INTRAOCULAR LENS  IMPLANT,  BILATERAL    . CHOLECYSTECTOMY    . COLON SURGERY     Colon cancer surgery, removed small amt not full colectomy  . COLONOSCOPY     nclear who follows him for this  . LEG SURGERY     For ? gangrene after running into barbwire fence at 67yo    MEDICATIONS: No current facility-administered medications for this encounter.    Marland Kitchen acetaminophen (TYLENOL) 500 MG tablet  . amLODipine (NORVASC) 5 MG tablet  . carvedilol (COREG) 25 MG tablet  . Docusate Calcium (STOOL SOFTENER PO)  . folic acid (FOLVITE) 1 MG tablet  . furosemide (LASIX) 80 MG tablet  . gabapentin (NEURONTIN) 300 MG capsule  . glipiZIDE (GLUCOTROL XL) 10 MG 24 hr tablet  . insulin aspart (NOVOLOG FLEXPEN) 100 UNIT/ML FlexPen  . insulin glargine (LANTUS) 100 unit/mL SOPN  . omeprazole (PRILOSEC) 20 MG capsule  . polyethylene glycol powder (MIRALAX) powder  . saxagliptin HCl (ONGLYZA) 5 MG TABS tablet  . Sennosides (SENNA LAXATIVE PO)  . sodium polystyrene (KAYEXALATE) 15 GM/60ML suspension  . tamsulosin (FLOMAX) 0.4 MG CAPS capsule  . tiZANidine (ZANAFLEX) 4 MG tablet  . VENTOLIN HFA 108 (90 Base) MCG/ACT inhaler    George Hugh [facility] Short Stay Center/Anesthesiology Phone [phone] 02/08/2018 2:37 PM

## 2018-02-09 DIAGNOSIS — A419 Sepsis, unspecified organism: Secondary | ICD-10-CM

## 2018-02-09 DIAGNOSIS — E1121 Type 2 diabetes mellitus with diabetic nephropathy: Secondary | ICD-10-CM

## 2018-02-09 DIAGNOSIS — F1410 Cocaine abuse, uncomplicated: Secondary | ICD-10-CM

## 2018-02-09 DIAGNOSIS — N179 Acute kidney failure, unspecified: Principal | ICD-10-CM

## 2018-02-09 DIAGNOSIS — D72829 Elevated white blood cell count, unspecified: Secondary | ICD-10-CM

## 2018-02-09 DIAGNOSIS — E875 Hyperkalemia: Secondary | ICD-10-CM

## 2018-02-09 DIAGNOSIS — N184 Chronic kidney disease, stage 4 (severe): Secondary | ICD-10-CM

## 2018-02-09 LAB — PROCALCITONIN: Procalcitonin: 1.13 ng/mL

## 2018-02-09 LAB — CBC WITH DIFFERENTIAL/PLATELET
BASOS ABS: 0 10*3/uL (ref 0.0–0.1)
Basophils Relative: 0 %
Eosinophils Absolute: 0 10*3/uL (ref 0.0–0.7)
Eosinophils Relative: 0 %
HEMATOCRIT: 21.1 % — AB (ref 39.0–52.0)
Hemoglobin: 6.8 g/dL — CL (ref 13.0–17.0)
Lymphocytes Relative: 11 %
Lymphs Abs: 2.2 10*3/uL (ref 0.7–4.0)
MCH: 21.9 pg — ABNORMAL LOW (ref 26.0–34.0)
MCHC: 32.2 g/dL (ref 30.0–36.0)
MCV: 68.1 fL — ABNORMAL LOW (ref 78.0–100.0)
MONOS PCT: 16 %
Monocytes Absolute: 3.3 10*3/uL — ABNORMAL HIGH (ref 0.1–1.0)
Neutro Abs: 14.9 10*3/uL — ABNORMAL HIGH (ref 1.7–7.7)
Neutrophils Relative %: 73 %
PLATELETS: 307 10*3/uL (ref 150–400)
RBC: 3.1 MIL/uL — AB (ref 4.22–5.81)
RDW: 16.8 % — ABNORMAL HIGH (ref 11.5–15.5)
WBC: 20.4 10*3/uL — AB (ref 4.0–10.5)

## 2018-02-09 LAB — RETICULOCYTES
RBC.: 3.15 MIL/uL — ABNORMAL LOW (ref 4.22–5.81)
RETIC COUNT ABSOLUTE: 129.2 10*3/uL (ref 19.0–186.0)
Retic Ct Pct: 4.1 % — ABNORMAL HIGH (ref 0.4–3.1)

## 2018-02-09 LAB — COMPREHENSIVE METABOLIC PANEL
ALT: 11 U/L (ref 0–44)
ANION GAP: 11 (ref 5–15)
AST: 24 U/L (ref 15–41)
Albumin: 3.4 g/dL — ABNORMAL LOW (ref 3.5–5.0)
Alkaline Phosphatase: 88 U/L (ref 38–126)
BILIRUBIN TOTAL: 1.6 mg/dL — AB (ref 0.3–1.2)
BUN: 53 mg/dL — AB (ref 8–23)
CO2: 18 mmol/L — ABNORMAL LOW (ref 22–32)
Calcium: 8.7 mg/dL — ABNORMAL LOW (ref 8.9–10.3)
Chloride: 100 mmol/L (ref 98–111)
Creatinine, Ser: 4.93 mg/dL — ABNORMAL HIGH (ref 0.61–1.24)
GFR, EST AFRICAN AMERICAN: 13 mL/min — AB (ref >60)
GFR, EST NON AFRICAN AMERICAN: 11 mL/min — AB (ref >60)
Glucose, Bld: 155 mg/dL — ABNORMAL HIGH (ref 70–99)
POTASSIUM: 5.1 mmol/L (ref 3.5–5.1)
Sodium: 129 mmol/L — ABNORMAL LOW (ref 135–145)
TOTAL PROTEIN: 7.1 g/dL (ref 6.5–8.1)

## 2018-02-09 LAB — LACTIC ACID, PLASMA: LACTIC ACID, VENOUS: 1.2 mmol/L (ref 0.5–1.9)

## 2018-02-09 LAB — GLUCOSE, CAPILLARY
GLUCOSE-CAPILLARY: 89 mg/dL (ref 70–99)
Glucose-Capillary: 74 mg/dL (ref 70–99)
Glucose-Capillary: 59 mg/dL — ABNORMAL LOW (ref 70–99)
Glucose-Capillary: 72 mg/dL (ref 70–99)
Glucose-Capillary: 170 mg/dL — ABNORMAL HIGH (ref 70–99)
Glucose-Capillary: 37 mg/dL — CL (ref 70–99)

## 2018-02-09 LAB — TROPONIN I: Troponin I: <0.03 ng/mL (ref <0.03)

## 2018-02-09 LAB — PROTIME-INR
INR: 1.27
PROTHROMBIN TIME: 15.8 s — AB (ref 11.4–15.2)

## 2018-02-09 LAB — CORTISOL-AM, BLOOD: Cortisol - AM: 12.4 ug/dL (ref 6.7–22.6)

## 2018-02-09 LAB — MRSA PCR SCREENING: MRSA by PCR: NEGATIVE

## 2018-02-09 MED ORDER — HEPARIN SODIUM (PORCINE) 5000 UNIT/ML IJ SOLN
5000.0000 [IU] | Freq: Three times a day (TID) | INTRAMUSCULAR | Status: DC
  Administered 2018-02-09 – 2018-02-12 (×10): 5000 [IU] via SUBCUTANEOUS
  Filled 2018-02-09 (×10): qty 1

## 2018-02-09 MED ORDER — SENNA 8.6 MG PO TABS
1.0000 | ORAL_TABLET | Freq: Every day | ORAL | Status: DC
  Administered 2018-02-09 – 2018-02-12 (×4): 8.6 mg via ORAL
  Filled 2018-02-09 (×4): qty 1

## 2018-02-09 MED ORDER — METRONIDAZOLE IN NACL 5-0.79 MG/ML-% IV SOLN
500.0000 mg | Freq: Three times a day (TID) | INTRAVENOUS | Status: DC
  Administered 2018-02-09 – 2018-02-10 (×4): 500 mg via INTRAVENOUS
  Filled 2018-02-09 (×4): qty 100

## 2018-02-09 MED ORDER — POLYETHYLENE GLYCOL 3350 17 G PO PACK
17.0000 g | PACK | Freq: Every day | ORAL | Status: DC
  Administered 2018-02-09 – 2018-02-12 (×4): 17 g via ORAL
  Filled 2018-02-09 (×4): qty 1

## 2018-02-09 MED ORDER — INFLUENZA VAC SPLIT HIGH-DOSE 0.5 ML IM SUSY
0.5000 mL | PREFILLED_SYRINGE | INTRAMUSCULAR | Status: AC
  Administered 2018-02-10: 0.5 mL via INTRAMUSCULAR
  Filled 2018-02-09: qty 0.5

## 2018-02-09 MED ORDER — ALBUTEROL SULFATE (2.5 MG/3ML) 0.083% IN NEBU
3.0000 mL | INHALATION_SOLUTION | Freq: Four times a day (QID) | RESPIRATORY_TRACT | Status: DC | PRN
  Filled 2018-02-09: qty 3

## 2018-02-09 MED ORDER — SODIUM CHLORIDE 0.45 % IV SOLN
INTRAVENOUS | Status: AC
  Administered 2018-02-09: 16:00:00 via INTRAVENOUS

## 2018-02-09 MED ORDER — INSULIN GLARGINE 100 UNIT/ML ~~LOC~~ SOLN
10.0000 [IU] | Freq: Every day | SUBCUTANEOUS | Status: DC
  Administered 2018-02-10: 10 [IU] via SUBCUTANEOUS
  Filled 2018-02-09: qty 0.1

## 2018-02-09 MED ORDER — HYDROCODONE-ACETAMINOPHEN 10-325 MG PO TABS
1.0000 | ORAL_TABLET | Freq: Once | ORAL | Status: AC
  Administered 2018-02-09: 1 via ORAL
  Filled 2018-02-09: qty 1

## 2018-02-09 NOTE — Progress Notes (Addendum)
PROGRESS NOTE   ARCHIT LEGER  FYB:[id number]    DOB: 10-Apr-1951    DOA: 02/08/2018  PCP: Nolene Ebbs, MD   I have briefly reviewed patients previous medical records in [facility].  Brief Narrative:  67 year old male with PMH of Hb SS disease, DM 2, stage IV chronic kidney disease (Dr. Marval Regal, Nephrology), HTN, GERD, polysubstance abuse, recent vocal cord lesion with dysphonia, reportedly used cocaine night prior to admission and then presented with abdominal pain, chest pain, dyspnea and leg pains.  In the ED, reportedly had sepsis criteria, acute on chronic kidney disease, leukocytosis and dehydration.  He was febrile at 101.5, RR 25, WBC 19.4.   Assessment & Plan:   Principal Problem:   Renal failure (ARF), acute on chronic (HCC) Active Problems:   Type 2 diabetes mellitus with renal complication (HCC)   Leukocytosis   Hyperkalemia, diminished renal excretion   Cocaine abuse (HCC)   CKD (chronic kidney disease) stage 5, GFR less than 15 ml/min (HCC)   Sepsis (HCC)   Suspected sepsis Vs all SIRS: Met sepsis criteria on admission.  Source unclear.?  GI related to abdominal CT findings although does not have nausea, vomiting or diarrhea.  No urinary or respiratory symptoms reported.?  Related to cocaine abuse.  Treated with IV fluids and started empirically on IV cefepime, vancomycin and metronidazole.  Reduce IV fluids.  Abdominal pain: CT abdomen (without contrast) 9/6 showed pathologically enlarged lymph nodes in the right retroperitoneum along the right iliac chain and posterior to the cecum with questionable mucosal thickening versus adherent stool to the walls of the cecum.  Patient however lacks other GI symptoms.  Patient states that he had colonoscopy approximately 7 years ago at this hospital.  However unable to find report in chart review.  I discussed with GI MD on call, CT findings are nonspecific, treat supportively for now and monitor.  Generalized aches/?   Sickle cell crisis onset: Treat supportively with IV fluids and pain management.  Cocaine abuse: Cessation counseled.  Acute on stage IV chronic kidney disease: Baseline creatinine not clear but may be in the low 4 range.  Presented with creatinine of 5.11.  Gentle IV fluids and follow BMP in a.m.  If worsens, may consider nephrology consultation and patient otherwise outpatient follow-up.  Leukocytosis: Patient appears to have chronic leukocytosis, most likely related to his hemoglobin SS disease.  This may be slightly worse than his baseline.  Not sure if he truly has a infectious etiology or this was all reactive to cocaine use.  Follow CBC in a.m.  Anemia: Chronic.  Multifactorial related to underlying hemoglobin SS disease and chronic kidney disease.  Baseline hemoglobin probably in the 7 g range.  Currently 6.8.  Monitor closely and follow CBC in a.m.  Asymptomatic.  May consider transfusing if drops further.  Type II DM with renal complications: Hypoglycemic episode of 59 this morning.  Discontinue glipizide and Tradjenta.  Continue sensitive SSI with close monitoring.  Reduce Lantus.  Hyperkalemia: Resolved.  Atypical chest pain: Possibly due to cocaine abuse.  Resolved today.  Troponin x 2: Neg.  Essential hypertension: Mildly uncontrolled at times.  Continue amlodipine, carvedilol.  GERD: Protonix.  DVT prophylaxis: Subcutaneous heparin. Code Status: Full. Family Communication: None at bedside. Disposition: DC home pending clinical improvement.   Consultants:  None  Procedures:  None  Antimicrobials:  IV cefepime, vancomycin and metronidazole   Subjective: Patient feels better this morning.  Reports pain behind his knees, low back,  soreness in the upper abdomen consistent with prior episodes of sickle cell crisis which he reports that he has been having infrequently as he has aged.  Had normal brown BM yesterday.  No nausea or vomiting.  No chest pain or dyspnea  reported.  ROS: As above, otherwise negative.  Objective:  Vitals:   02/09/18 0039 02/09/18 0436 02/09/18 0728 02/09/18 1111  BP: (!) 152/95 (!) 142/77 (!) 143/74 (!) 139/109  Pulse: 76 79 73 65  Resp: _0 Temp: 99.2 F (37.3 C) 98.6 F (37 C) 98.3 F (36.8 C) 98 F (36.7 C)  TempSrc: Oral Oral Oral Oral  SpO2: 100% 100% 95% 100%  Weight:      Height:        Examination:  General exam: Pleasant middle-aged male, moderately built and nourished, lying comfortably propped up in bed. Respiratory system: Clear to auscultation. Respiratory effort normal. Cardiovascular system: S1 & S2 heard, RRR. No JVD, murmurs, rubs, gallops or clicks. No pedal edema. Gastrointestinal system: Abdomen is nondistended, soft.  Mild upper abdominal tenderness without rigidity, guarding or rebound. No organomegaly or masses felt. Normal bowel sounds heard. Central nervous system: Alert and oriented. No focal neurological deficits. Extremities: Symmetric 5 x 5 power. Skin: No rashes, lesions or ulcers Psychiatry: Judgement and insight appear normal. Mood & affect appropriate.     Data Reviewed: I have personally reviewed following labs and imaging studies  CBC: Recent Labs  Lab 02/08/18 1845 02/09/18 0118  WBC 19.4* 20.4*  NEUTROABS 15.2* 14.9*  HGB 7.7* 6.8*  HCT 24.1* 21.1*  MCV 68.5* 68.1*  PLT 362 111   Basic Metabolic Panel: Recent Labs  Lab 02/08/18 1845 02/09/18 0118  NA 127* 129*  K 5.5* 5.1  CL 98 100  CO2 16* 18*  GLUCOSE 128* 155*  BUN 55* 53*  CREATININE 5.11* 4.93*  CALCIUM 9.3 8.7*   Liver Function Tests: Recent Labs  Lab 02/08/18 1845 02/09/18 0118  AST 35 24  ALT 15 11  ALKPHOS 104 88  BILITOT 1.6* 1.6*  PROT 8.2* 7.1  ALBUMIN 4.0 3.4*   Coagulation Profile: Recent Labs  Lab 02/09/18 0118  INR 1.27   Cardiac Enzymes: Recent Labs  Lab 02/09/18 0118 02/09/18 0907  TROPONINI <0.03 <0.03   HbA1C: No results for input(s): HGBA1C in the  last 72 hours. CBG: Recent Labs  Lab 02/08/18 2329 02/09/18 0726 02/09/18 1109 02/09/18 1142  GLUCAP 149* 74 59* 72    Recent Results (from the past 240 hour(s))  MRSA PCR Screening     Status: None   Collection Time: 02/08/18 11:10 PM  Result Value Ref Range Status   MRSA by PCR NEGATIVE NEGATIVE Final    Comment:        The GeneXpert MRSA Assay (FDA approved for NASAL specimens only), is one component of a comprehensive MRSA colonization surveillance program. It is not intended to diagnose MRSA infection nor to guide or monitor treatment for MRSA infections. Performed at [facility], [place] [address] [place], [place] [address]          Radiology Studies: Ct Abdomen Pelvis Wo Contrast  Result Date: 02/08/2018 CLINICAL DATA:  Abdominal distension.  History of colon cancer. EXAM: CT ABDOMEN AND PELVIS WITHOUT CONTRAST TECHNIQUE: Multidetector CT imaging of the abdomen and pelvis was performed following the standard protocol without IV contrast. COMPARISON:  11/04/2016 FINDINGS: Lower chest: Scarring in the anterior aspect of the right middle lobe and posterolateral aspect  of the left lower lobe. Hepatobiliary: No focal liver abnormality is seen. Status post cholecystectomy. No biliary dilatation. Pancreas: Unremarkable. No pancreatic ductal dilatation or surrounding inflammatory changes. Spleen: Contracted calcified spleen, chronic finding. Adrenals/Urinary Tract: Adrenal glands are unremarkable. Kidneys are normal, without renal calculi, focal lesion, or hydronephrosis. Bladder is markedly distended. Stomach/Bowel: Questionable mucosal thickening versus formed stool adherent to the walls of the cecum. The bowel is suboptimally visualized due to lack of opacification. Vascular/Lymphatic: Enlarged lymph nodes along the right iliac chain and posterior to the cecum, the largest measuring 2 cm Reproductive: The prostate gland is normal in size. Other: No abdominal wall hernia or  abnormality. No abdominopelvic ascites. Musculoskeletal: Markedly heterogeneous sclerotic appearance of the osseous structures. IMPRESSION: Interval development of pathologically enlarged lymph nodes in the right retroperitoneum along the right iliac chain and posterior to the cecum. Questionable mucosal thickening versus adherent stool to the walls of the cecum. Marked dilation of the urinary bladder. Please correlate clinically. Marked heterogeneous mottled sclerotic appearance of the osseous structures. This may be due to sickle cell disease or represents diffuse metastatic disease. Electronically Signed   By: Fidela Salisbury M.D.   On: 02/08/2018 20:52   Dg Chest Port 1 View  Result Date: 02/08/2018 CLINICAL DATA:  Shortness of breath. EXAM: PORTABLE CHEST 1 VIEW COMPARISON:  One-view chest x-ray 11/13/2017 FINDINGS: Heart is enlarged. Mild pulmonary vascular congestion is present. No focal airspace disease present. There are no definite effusions. Mild degenerative changes are present at the shoulders bilaterally. Sclerotic changes are present at both humeral heads, compatible with known sickle cell disease. IMPRESSION: 1. Cardiomegaly and moderate pulmonary vascular congestion without frank edema. 2. No focal airspace disease. 3. Sclerotic changes at the humeral heads compatible with sickle cell disease. Electronically Signed   By: San Morelle M.D.   On: 02/08/2018 19:23        Scheduled Meds: . amLODipine  5 mg Oral Daily  . carvedilol  25 mg Oral BID WC  . folic acid  1 mg Oral Daily  . gabapentin  300 mg Oral TID  . glipiZIDE  10 mg Oral BID WC  . heparin  5,000 Units Subcutaneous Q8H  . insulin aspart  0-5 Units Subcutaneous QHS  . insulin aspart  0-9 Units Subcutaneous TID WC  . insulin glargine  20 Units Subcutaneous QHS  . linagliptin  5 mg Oral Daily  . pantoprazole  40 mg Oral Daily  . tamsulosin  0.4 mg Oral Daily  . vancomycin variable dose per unstable renal  function (pharmacist dosing)   Does not apply See admin instructions   Continuous Infusions: . sodium chloride    . ceFEPime (MAXIPIME) IV    . metronidazole Stopped (02/09/18 0919)     LOS: 1 day     Vernell Leep, MD, FACP, [facility]. [facility] Pager [phone]  If 7PM-7AM, please contact night-coverage [email or web address] Password [facility] 02/09/2018, 2:33 PM

## 2018-02-09 NOTE — Progress Notes (Signed)
Low blood sugar reported. 33mg /dl, patient alert , appears a bit sleepy but obeys commands, protocol initiated to be rechecked.

## 2018-02-09 NOTE — Plan of Care (Signed)

## 2018-02-09 NOTE — Progress Notes (Signed)
Patient is more alert , Bs increased , had some more orange juice , now resting . Will continue to monitor  BS.

## 2018-02-10 DIAGNOSIS — R6510 Systemic inflammatory response syndrome (SIRS) of non-infectious origin without acute organ dysfunction: Secondary | ICD-10-CM

## 2018-02-10 DIAGNOSIS — D5700 Hb-SS disease with crisis, unspecified: Secondary | ICD-10-CM

## 2018-02-10 LAB — RETICULOCYTES
RBC.: 2.72 MIL/uL — AB (ref 4.22–5.81)
RETIC COUNT ABSOLUTE: 87 10*3/uL (ref 19.0–186.0)
Retic Ct Pct: 3.2 % — ABNORMAL HIGH (ref 0.4–3.1)

## 2018-02-10 LAB — BASIC METABOLIC PANEL
Anion gap: 8 (ref 5–15)
BUN: 50 mg/dL — AB (ref 8–23)
CO2: 19 mmol/L — ABNORMAL LOW (ref 22–32)
CREATININE: 4.41 mg/dL — AB (ref 0.61–1.24)
Calcium: 8.5 mg/dL — ABNORMAL LOW (ref 8.9–10.3)
Chloride: 106 mmol/L (ref 98–111)
GFR calc Af Amer: 14 mL/min — ABNORMAL LOW (ref >60)
GFR, EST NON AFRICAN AMERICAN: 12 mL/min — AB (ref >60)
Glucose, Bld: 109 mg/dL — ABNORMAL HIGH (ref 70–99)
Potassium: 4.8 mmol/L (ref 3.5–5.1)
SODIUM: 133 mmol/L — AB (ref 135–145)

## 2018-02-10 LAB — HEMOGLOBIN AND HEMATOCRIT, BLOOD
HEMATOCRIT: 26.8 % — AB (ref 39.0–52.0)
Hemoglobin: 8.7 g/dL — ABNORMAL LOW (ref 13.0–17.0)

## 2018-02-10 LAB — GLUCOSE, CAPILLARY
GLUCOSE-CAPILLARY: 39 mg/dL — AB (ref 70–99)
Glucose-Capillary: 104 mg/dL — ABNORMAL HIGH (ref 70–99)
Glucose-Capillary: 125 mg/dL — ABNORMAL HIGH (ref 70–99)
Glucose-Capillary: 146 mg/dL — ABNORMAL HIGH (ref 70–99)
Glucose-Capillary: 151 mg/dL — ABNORMAL HIGH (ref 70–99)
Glucose-Capillary: 214 mg/dL — ABNORMAL HIGH (ref 70–99)
Glucose-Capillary: 97 mg/dL (ref 70–99)

## 2018-02-10 LAB — CBC
HCT: 18 % — ABNORMAL LOW (ref 39.0–52.0)
Hemoglobin: 5.9 g/dL — CL (ref 13.0–17.0)
MCH: 21.9 pg — AB (ref 26.0–34.0)
MCHC: 32.8 g/dL (ref 30.0–36.0)
MCV: 66.7 fL — ABNORMAL LOW (ref 78.0–100.0)
PLATELETS: 251 10*3/uL (ref 150–400)
RBC: 2.7 MIL/uL — AB (ref 4.22–5.81)
RDW: 17.5 % — AB (ref 11.5–15.5)
WBC: 19.9 10*3/uL — ABNORMAL HIGH (ref 4.0–10.5)

## 2018-02-10 LAB — VITAMIN B12: VITAMIN B 12: 230 pg/mL (ref 180–914)

## 2018-02-10 LAB — IRON AND TIBC
Iron: 48 ug/dL (ref 45–182)
SATURATION RATIOS: 22 % (ref 17.9–39.5)
TIBC: 214 ug/dL — ABNORMAL LOW (ref 250–450)
UIBC: 166 ug/dL

## 2018-02-10 LAB — FERRITIN: Ferritin: 3590 ng/mL — ABNORMAL HIGH (ref 24–336)

## 2018-02-10 LAB — VANCOMYCIN, RANDOM: Vancomycin Rm: 15

## 2018-02-10 LAB — PREPARE RBC (CROSSMATCH)

## 2018-02-10 LAB — LACTATE DEHYDROGENASE: LDH: 314 U/L — ABNORMAL HIGH (ref 98–192)

## 2018-02-10 LAB — FOLATE: Folate: 35 ng/mL (ref >5.9)

## 2018-02-10 MED ORDER — SODIUM CHLORIDE 0.45 % IV SOLN
INTRAVENOUS | Status: AC
  Administered 2018-02-10 (×2): via INTRAVENOUS

## 2018-02-10 MED ORDER — SODIUM CHLORIDE 0.9% IV SOLUTION: Freq: Once | INTRAVENOUS | Status: DC

## 2018-02-10 MED ORDER — DEXTROSE 50 % IV SOLN
INTRAVENOUS | Status: AC
  Administered 2018-02-10: 50 mL
  Filled 2018-02-10: qty 50

## 2018-02-10 NOTE — Progress Notes (Signed)
Patient stuck multiple times for labs, small amount of blood each time, Call [facility] for any lab needs.

## 2018-02-10 NOTE — Progress Notes (Signed)
Patient ,c/o pain at dialysis port and  Inability to move left arm as he normally does. Area above port , pt has guarding states hand is getting 'stiffer'- received pain meds earlier states  it didn't help, page sent to Dr  Kennon Holter  on call.

## 2018-02-10 NOTE — Progress Notes (Addendum)
PROGRESS NOTE   [name]  UYQ:[id number]    DOB: 23-Sep-1950    DOA: 02/08/2018  PCP: Nolene Ebbs, MD   I have briefly reviewed patients previous medical records in [facility].  Brief Narrative:  67 year old male with PMH of Hb SS disease, DM 2, stage IV chronic kidney disease (Dr. Marval Regal, Nephrology), HTN, GERD, polysubstance abuse, recent vocal cord lesion with dysphonia, reportedly used cocaine night prior to admission and then presented with abdominal pain, chest pain, dyspnea and leg pains.  In the ED, reportedly had sepsis criteria, acute on chronic kidney disease, leukocytosis and dehydration.  He was febrile at 101.5, RR 25, WBC 19.4.   Assessment & Plan:   Principal Problem:   Renal failure (ARF), acute on chronic (HCC) Active Problems:   Type 2 diabetes mellitus with renal complication (HCC)   Leukocytosis   Hyperkalemia, diminished renal excretion   Cocaine abuse (HCC)   CKD (chronic kidney disease) stage 5, GFR less than 15 ml/min (HCC)   Sepsis (HCC)   Suspected sepsis Vs all SIRS: Met sepsis criteria on admission.  Source unclear.?  GI related to abdominal CT findings although does not have nausea, vomiting or diarrhea.  No urinary or respiratory symptoms reported.?  Related to cocaine abuse.  Urine microscopy not suggestive of UTI.  Blood cultures x2: Negative to date.  Chest x-ray without focal airspace disease.  Treated with IV fluids and started empirically on IV cefepime, vancomycin and metronidazole.  No clear convincing source of sepsis.  Low-grade fevers could be related to sickle cell crisis.  Discontinue all antibiotics and monitor.  Abdominal pain: CT abdomen (without contrast) 9/6 showed pathologically enlarged lymph nodes in the right retroperitoneum along the right iliac chain and posterior to the cecum with questionable mucosal thickening versus adherent stool to the walls of the cecum.  Patient however lacks other GI symptoms.  Patient states  that he had colonoscopy approximately 7 years ago at this hospital.  However unable to find report in chart review.  I discussed with GI MD on call 9/7, CT findings are nonspecific, treat supportively for now and monitor.  Recommend outpatient GI follow-up for colonoscopy.  Generalized aches/?  Sickle cell crisis onset: Treat supportively with IV fluids and pain management.  Improving.  Continue gentle fluids and pain management.  Transfused 1 unit PRBC for hemoglobin of 5.9 on 9/8.  Check LDH.  Reticulocytes 87.  Cocaine abuse: Cessation counseled.  Last used cocaine day prior to admission.  Acute on stage IV chronic kidney disease: Baseline creatinine not clear but may be in the low 4 range.  Presented with creatinine of 5.11.  Gentle IV fluids and follow BMP in a.m.  If worsens, may consider nephrology consultation and patient otherwise outpatient follow-up.  Creatinine has improved to 4.41.  Leukocytosis: Patient appears to have chronic leukocytosis, most likely related to his hemoglobin SS disease.  This may be slightly worse than his baseline.  Not sure if he truly has a infectious etiology or this was all reactive to cocaine use.  Follow CBC in a.m.  Anemia: Chronic.  Multifactorial related to underlying hemoglobin SS disease and chronic kidney disease.  Baseline hemoglobin probably in the 7 g range.  Hemoglobin had dropped to 5.9 and received 1 unit PRBC 9/8.  Anemia panel reviewed: Reticulocytes 87, ferritin 3590 (451 in June 2018), iron 48, folate 35, B12: 230.?  Consider desferrioxamine.  Type II DM with renal complications: Hypoglycemic episode 9/7 and 8.  Despite discontinuing glipizide, Tradjenta and reducing Lantus, had hypoglycemic episode again this morning.  Discontinue Lantus.  Continue only sensitive SSI and monitor closely.  Hyperkalemia: Resolved.  Atypical chest pain: Possibly due to cocaine abuse.  Resolved.  Troponin x 2: Neg.  Essential hypertension: Mildly uncontrolled at  times.  Continue amlodipine, carvedilol.  GERD: Protonix.  DVT prophylaxis: Subcutaneous heparin. Code Status: Full. Family Communication: None at bedside. Disposition: DC home pending clinical improvement.   Consultants:  None  Procedures:  None  Antimicrobials:  IV cefepime, vancomycin and metronidazole   Subjective: No further abdominal pain.  Denies nausea or vomiting.  Tolerating diet.  Had soft brown BM 9/6.  No back pain.  Some pain in his knees and elbows consistent with prior sickle cell crisis.  ROS: As above, otherwise negative.  Objective:  Vitals:   02/10/18 0005 02/10/18 0426 02/10/18 0649 02/10/18 0721  BP: (!) 126/100 (!) 148/65 (!) 154/65 (!) 147/68  Pulse: 78 78 81   Resp: 17 16 (!) 22 20  Temp: 99.6 F (37.6 C) 99.4 F (37.4 C) 99.4 F (37.4 C) 99.4 F (37.4 C)  TempSrc: Oral Oral Oral Oral  SpO2: 97% 95% 98% 96%  Weight:      Height:        Examination:  General exam: Pleasant middle-aged male, moderately built and nourished, lying comfortably propped up in bed.  Stable. Respiratory system: Clear to auscultation. Respiratory effort normal.  Stable. Cardiovascular system: S1 & S2 heard, RRR. No JVD, murmurs, rubs, gallops or clicks. No pedal edema.  Telemetry personally reviewed: Sinus rhythm with BBB morphology. Gastrointestinal system: Abdomen is nondistended, soft.  No abdominal tenderness. Normal bowel sounds heard. Central nervous system: Alert and oriented. No focal neurological deficits.  Stable. Extremities: Symmetric 5 x 5 power.  No acute findings in joints noted. Skin: No rashes, lesions or ulcers Psychiatry: Judgement and insight appear normal. Mood & affect appropriate.     Data Reviewed: I have personally reviewed following labs and imaging studies  CBC: Recent Labs  Lab 02/08/18 1845 02/09/18 0118 02/10/18 0426  WBC 19.4* 20.4* 19.9*  NEUTROABS 15.2* 14.9*  --   HGB 7.7* 6.8* 5.9*  HCT 24.1* 21.1* 18.0*  MCV 68.5*  68.1* 66.7*  PLT 362 307 161   Basic Metabolic Panel: Recent Labs  Lab 02/08/18 1845 02/09/18 0118 02/10/18 0426  NA 127* 129* 133*  K 5.5* 5.1 4.8  CL 98 100 106  CO2 16* 18* 19*  GLUCOSE 128* 155* 109*  BUN 55* 53* 50*  CREATININE 5.11* 4.93* 4.41*  CALCIUM 9.3 8.7* 8.5*   Liver Function Tests: Recent Labs  Lab 02/08/18 1845 02/09/18 0118  AST 35 24  ALT 15 11  ALKPHOS 104 88  BILITOT 1.6* 1.6*  PROT 8.2* 7.1  ALBUMIN 4.0 3.4*   Coagulation Profile: Recent Labs  Lab 02/09/18 0118  INR 1.27   Cardiac Enzymes: Recent Labs  Lab 02/09/18 0118 02/09/18 0907  TROPONINI <0.03 <0.03   HbA1C: No results for input(s): HGBA1C in the last 72 hours. CBG: Recent Labs  Lab 02/09/18 2124 02/10/18 0008 02/10/18 0421 02/10/18 0727 02/10/18 0833  GLUCAP 89 146* 104* 39* 151*    Recent Results (from the past 240 hour(s))  MRSA PCR Screening     Status: None   Collection Time: 02/08/18 11:10 PM  Result Value Ref Range Status   MRSA by PCR NEGATIVE NEGATIVE Final    Comment:        The  GeneXpert MRSA Assay (FDA approved for NASAL specimens only), is one component of a comprehensive MRSA colonization surveillance program. It is not intended to diagnose MRSA infection nor to guide or monitor treatment for MRSA infections. Performed at [facility], [place] [address] [place], [place] [address]   Blood culture (routine x 2)     Status: None (Preliminary result)   Collection Time: 02/09/18  1:00 AM  Result Value Ref Range Status   Specimen Description BLOOD RIGHT ANTECUBITAL  Final   Special Requests   Final    BOTTLES DRAWN AEROBIC AND ANAEROBIC Blood Culture adequate volume   Culture   Final    NO GROWTH 1 DAY Performed at [facility], [place] [address], [place], [place] [address]    Report Status PENDING  Incomplete  Blood culture (routine x 2)     Status: None (Preliminary result)   Collection Time: 02/09/18  1:15 AM  Result Value Ref Range  Status   Specimen Description BLOOD RIGHT FOREARM  Final   Special Requests   Final    BOTTLES DRAWN AEROBIC AND ANAEROBIC Blood Culture adequate volume   Culture   Final    NO GROWTH 1 DAY Performed at [facility], [place] [address] [place], [place] [address]    Report Status PENDING  Incomplete         Radiology Studies: Ct Abdomen Pelvis Wo Contrast  Result Date: 02/08/2018 CLINICAL DATA:  Abdominal distension.  History of colon cancer. EXAM: CT ABDOMEN AND PELVIS WITHOUT CONTRAST TECHNIQUE: Multidetector CT imaging of the abdomen and pelvis was performed following the standard protocol without IV contrast. COMPARISON:  11/04/2016 FINDINGS: Lower chest: Scarring in the anterior aspect of the right middle lobe and posterolateral aspect of the left lower lobe. Hepatobiliary: No focal liver abnormality is seen. Status post cholecystectomy. No biliary dilatation. Pancreas: Unremarkable. No pancreatic ductal dilatation or surrounding inflammatory changes. Spleen: Contracted calcified spleen, chronic finding. Adrenals/Urinary Tract: Adrenal glands are unremarkable. Kidneys are normal, without renal calculi, focal lesion, or hydronephrosis. Bladder is markedly distended. Stomach/Bowel: Questionable mucosal thickening versus formed stool adherent to the walls of the cecum. The bowel is suboptimally visualized due to lack of opacification. Vascular/Lymphatic: Enlarged lymph nodes along the right iliac chain and posterior to the cecum, the largest measuring 2 cm Reproductive: The prostate gland is normal in size. Other: No abdominal wall hernia or abnormality. No abdominopelvic ascites. Musculoskeletal: Markedly heterogeneous sclerotic appearance of the osseous structures. IMPRESSION: Interval development of pathologically enlarged lymph nodes in the right retroperitoneum along the right iliac chain and posterior to the cecum. Questionable mucosal thickening versus adherent stool to the walls of the  cecum. Marked dilation of the urinary bladder. Please correlate clinically. Marked heterogeneous mottled sclerotic appearance of the osseous structures. This may be due to sickle cell disease or represents diffuse metastatic disease. Electronically Signed   By: Fidela Salisbury M.D.   On: 02/08/2018 20:52   Dg Chest Port 1 View  Result Date: 02/08/2018 CLINICAL DATA:  Shortness of breath. EXAM: PORTABLE CHEST 1 VIEW COMPARISON:  One-view chest x-ray 11/13/2017 FINDINGS: Heart is enlarged. Mild pulmonary vascular congestion is present. No focal airspace disease present. There are no definite effusions. Mild degenerative changes are present at the shoulders bilaterally. Sclerotic changes are present at both humeral heads, compatible with known sickle cell disease. IMPRESSION: 1. Cardiomegaly and moderate pulmonary vascular congestion without frank edema. 2. No focal airspace disease. 3. Sclerotic changes at the  humeral heads compatible with sickle cell disease. Electronically Signed   By: San Morelle M.D.   On: 02/08/2018 19:23        Scheduled Meds: . sodium chloride   Intravenous Once  . amLODipine  5 mg Oral Daily  . carvedilol  25 mg Oral BID WC  . folic acid  1 mg Oral Daily  . gabapentin  300 mg Oral TID  . heparin  5,000 Units Subcutaneous Q8H  . Influenza vac split quadrivalent PF  0.5 mL Intramuscular Tomorrow-1000  . insulin aspart  0-9 Units Subcutaneous TID WC  . pantoprazole  40 mg Oral Daily  . polyethylene glycol  17 g Oral Daily  . senna  1 tablet Oral Daily  . tamsulosin  0.4 mg Oral Daily  . vancomycin variable dose per unstable renal function (pharmacist dosing)   Does not apply See admin instructions   Continuous Infusions: . ceFEPime (MAXIPIME) IV 1 g (02/09/18 2147)  . metronidazole Stopped (02/10/18 2947)     LOS: 2 days     Vernell Leep, MD, FACP, [facility]. [facility] Pager [phone]  If 7PM-7AM, please contact  night-coverage [email or web address] Password [email or web address] 02/10/2018, 9:40 AM

## 2018-02-11 ENCOUNTER — Ambulatory Visit (HOSPITAL_COMMUNITY): Admission: RE | Admit: 2018-02-11 | Payer: Medicare HMO | Source: Ambulatory Visit | Admitting: Otolaryngology

## 2018-02-11 ENCOUNTER — Other Ambulatory Visit: Payer: Self-pay | Admitting: Family Medicine

## 2018-02-11 DIAGNOSIS — G8929 Other chronic pain: Secondary | ICD-10-CM

## 2018-02-11 DIAGNOSIS — D5700 Hb-SS disease with crisis, unspecified: Secondary | ICD-10-CM

## 2018-02-11 HISTORY — DX: Malignant (primary) neoplasm, unspecified: C80.1

## 2018-02-11 HISTORY — DX: Unspecified osteoarthritis, unspecified site: M19.90

## 2018-02-11 HISTORY — DX: Presence of spectacles and contact lenses: Z97.3

## 2018-02-11 LAB — BPAM RBC
BLOOD PRODUCT EXPIRATION DATE: 201909292359
ISSUE DATE / TIME: 201909080642
UNIT TYPE AND RH: 8400

## 2018-02-11 LAB — COMPREHENSIVE METABOLIC PANEL
ALT: 10 U/L (ref 0–44)
AST: 15 U/L (ref 15–41)
Albumin: 2.6 g/dL — ABNORMAL LOW (ref 3.5–5.0)
Alkaline Phosphatase: 66 U/L (ref 38–126)
Anion gap: 9 (ref 5–15)
BUN: 47 mg/dL — ABNORMAL HIGH (ref 8–23)
CHLORIDE: 108 mmol/L (ref 98–111)
CO2: 18 mmol/L — ABNORMAL LOW (ref 22–32)
CREATININE: 4.37 mg/dL — AB (ref 0.61–1.24)
Calcium: 8.3 mg/dL — ABNORMAL LOW (ref 8.9–10.3)
GFR calc Af Amer: 15 mL/min — ABNORMAL LOW (ref >60)
GFR calc non Af Amer: 13 mL/min — ABNORMAL LOW (ref >60)
Glucose, Bld: 108 mg/dL — ABNORMAL HIGH (ref 70–99)
Potassium: 4.7 mmol/L (ref 3.5–5.1)
Sodium: 135 mmol/L (ref 135–145)
Total Bilirubin: 1.3 mg/dL — ABNORMAL HIGH (ref 0.3–1.2)
Total Protein: 6 g/dL — ABNORMAL LOW (ref 6.5–8.1)

## 2018-02-11 LAB — TYPE AND SCREEN
ABO/RH(D): AB POS
Antibody Screen: NEGATIVE
Unit division: 0

## 2018-02-11 LAB — CBC
HEMATOCRIT: 21.3 % — AB (ref 39.0–52.0)
Hemoglobin: 7 g/dL — ABNORMAL LOW (ref 13.0–17.0)
MCH: 23 pg — AB (ref 26.0–34.0)
MCHC: 32.9 g/dL (ref 30.0–36.0)
MCV: 70.1 fL — ABNORMAL LOW (ref 78.0–100.0)
Platelets: 270 10*3/uL (ref 150–400)
RBC: 3.04 MIL/uL — ABNORMAL LOW (ref 4.22–5.81)
RDW: 20.2 % — AB (ref 11.5–15.5)
WBC: 18.8 10*3/uL — ABNORMAL HIGH (ref 4.0–10.5)

## 2018-02-11 LAB — GLUCOSE, CAPILLARY
GLUCOSE-CAPILLARY: 112 mg/dL — AB (ref 70–99)
GLUCOSE-CAPILLARY: 133 mg/dL — AB (ref 70–99)
Glucose-Capillary: 64 mg/dL — ABNORMAL LOW (ref 70–99)
Glucose-Capillary: 104 mg/dL — ABNORMAL HIGH (ref 70–99)
Glucose-Capillary: 94 mg/dL (ref 70–99)

## 2018-02-11 SURGERY — MICROLARYNGOSCOPY, WITH VOCAL CORD INJECTION
Anesthesia: General

## 2018-02-11 MED ORDER — OXYCODONE HCL 5 MG PO CAPS: 5.0000 mg | ORAL_CAPSULE | ORAL | 0 refills | Status: DC | PRN

## 2018-02-11 MED ORDER — OXYCODONE HCL 5 MG PO CAPS: 5.0000 mg | ORAL_CAPSULE | ORAL | 0 refills | Status: AC | PRN

## 2018-02-11 MED ORDER — SODIUM CHLORIDE 0.45 % IV SOLN
INTRAVENOUS | Status: AC
  Administered 2018-02-11: 20:00:00 via INTRAVENOUS

## 2018-02-11 MED ORDER — OXYCODONE HCL 5 MG PO TABS
10.0000 mg | ORAL_TABLET | ORAL | Status: DC | PRN
  Administered 2018-02-11 – 2018-02-12 (×5): 10 mg via ORAL
  Filled 2018-02-11 (×5): qty 2

## 2018-02-11 NOTE — Progress Notes (Signed)
Sent to pharm per L. Smith Tavian, NP

## 2018-02-11 NOTE — Progress Notes (Signed)
PROGRESS NOTE   [name]  AYT:[id number]    DOB: [birth year]    DOA: 02/08/2018  PCP: Nolene Ebbs, MD   I have briefly reviewed patients previous medical records in [facility].  Brief Narrative:  67 year old male with PMH of Hb SS disease, DM 2, stage IV chronic kidney disease (Dr. Marval Regal, Nephrology), HTN, GERD, polysubstance abuse, recent vocal cord lesion with dysphonia, reportedly used cocaine night prior to admission and then presented with abdominal pain, chest pain, dyspnea and leg pains.  In the ED, reportedly had sepsis criteria, acute on chronic kidney disease, leukocytosis and dehydration.  He was febrile at 101.5, RR 25, WBC 19.4.   Assessment & Plan:   Principal Problem:   Renal failure (ARF), acute on chronic (HCC) Active Problems:   Type 2 diabetes mellitus with renal complication (HCC)   Leukocytosis   Hyperkalemia, diminished renal excretion   Cocaine abuse (HCC)   CKD (chronic kidney disease) stage 5, GFR less than 15 ml/min (HCC)   Sepsis (HCC)   Suspected sepsis Vs all SIRS: Met sepsis criteria on admission.  Source unclear.?  GI related to abdominal CT findings although does not have nausea, vomiting or diarrhea.  No urinary or respiratory symptoms reported.?  Related to cocaine abuse.  Urine microscopy not suggestive of UTI.  Blood cultures x2: Negative to date.  Chest x-ray without focal airspace disease.  Treated with IV fluids and started empirically on IV cefepime, vancomycin and metronidazole.  No clear convincing source of sepsis.  Low-grade fevers could be related to sickle cell crisis.  Discontinue all antibiotics and monitor.  Stable.  Abdominal pain: CT abdomen (without contrast) 9/6 showed pathologically enlarged lymph nodes in the right retroperitoneum along the right iliac chain and posterior to the cecum with questionable mucosal thickening versus adherent stool to the walls of the cecum.  Patient however lacks other GI symptoms.  Patient  states that he had colonoscopy approximately 7 years ago at this hospital.  However unable to find report in chart review.  I discussed with GI MD on call 9/7, CT findings are nonspecific, treat supportively for now and monitor.  Recommend outpatient GI follow-up for colonoscopy.  Resolved.  Hb SS sickle cell crisis: Treated supportively with IV fluids and pain management.  Improving.  Continue gentle fluids and pain management.  Transfused 1 unit PRBC for hemoglobin of 5.9 on 9/8.  Check LDH elevated.  Reticulocytes 87.  Patient reports pain in both elbows, wrists and knees consistent with prior episodes of sickle cell crisis.  Reports slowly improving but still with significant pain.  Sickle cell team consulted, discussed with them and initiated pain regimen.  Cocaine abuse: Cessation counseled.  Last used cocaine day prior to admission.  Acute on stage IV chronic kidney disease: Baseline creatinine not clear but may be in the low 4 range.  Presented with creatinine of 5.11.  Gentle IV fluids and follow BMP in a.m.  If worsens, may consider nephrology consultation and patient otherwise outpatient follow-up.  Creatinine has improved to 4.37.  Has left forearm AV fistula.  Not yet on dialysis.  Leukocytosis: Patient appears to have chronic leukocytosis, most likely related to his hemoglobin SS disease.  This may be slightly worse than his baseline.  Not sure if he truly has a infectious etiology or this was all reactive to cocaine use.  Stable.  Anemia: Chronic.  Multifactorial related to underlying hemoglobin SS disease and chronic kidney disease.  Baseline hemoglobin probably in  the 7 g range.  Hemoglobin had dropped to 5.9 and received 1 unit PRBC 9/8.  Anemia panel reviewed: Reticulocytes 87, ferritin 3590 (451 in June 2018), iron 48, folate 35, B12: 230.?  Consider desferrioxamine.  Hemoglobin posttransfusion has equilibrated to 7.  Follow CBC in a.m.  Type II DM with renal complications:  Hypoglycemic episode 9/7 and 8.  Despite discontinuing glipizide, Tradjenta and reducing Lantus, had hypoglycemic episode again 9/8.  Discontinue Lantus.  Patient had another low CBG 64 on 9/9 morning.  Discontinue all insulins and monitor closely.  If he has hyperglycemia, may consider customized SSI.  Hyperkalemia: Resolved.  Atypical chest pain: Possibly due to cocaine abuse.  Resolved.  Troponin x 2: Neg.  Essential hypertension: Mildly uncontrolled at times.  Continue amlodipine, carvedilol.  GERD: Protonix.  DVT prophylaxis: Subcutaneous heparin. Code Status: Full. Family Communication: None at bedside. Disposition: DC home pending improvement in his sickle cell crisis.   Consultants:  None  Procedures:  None  Antimicrobials:  IV cefepime, vancomycin and metronidazole   Subjective: No abdominal pain.  Tolerating diet.  No nausea, vomiting or diarrhea.  Reports ongoing pain in his elbows, wrists knees consistent with prior episodes of gout flare.  Difficulty ambulating due to significant knee pain.  Also felt a little dizzy.  ROS: As above, otherwise negative.  Objective:  Vitals:   02/10/18 1919 02/10/18 1943 02/11/18 0831 02/11/18 1647  BP: (!) 159/86 (!) 159/77 (!) 145/69 (!) 168/81  Pulse: 77  71 74  Resp: 16 20    Temp: 100.1 F (37.8 C) 99.1 F (37.3 C) 98.6 F (37 C) 99.1 F (37.3 C)  TempSrc: Oral Oral Oral Oral  SpO2: 99%  100% 98%  Weight:      Height:        Examination:  General exam: Pleasant middle-aged male, moderately built and nourished, lying comfortably propped up in bed.  Stable. Respiratory system: Clear to auscultation. Respiratory effort normal.  Stable. Cardiovascular system: S1 & S2 heard, RRR. No JVD, murmurs, rubs, gallops or clicks. No pedal edema.   Gastrointestinal system: Abdomen is nondistended, soft.  No abdominal tenderness. Normal bowel sounds heard.  Stable. Central nervous system: Alert and oriented. No focal  neurological deficits.  Stable. Extremities: Symmetric 5 x 5 power.  Bilateral elbows, wrists and knees, mildly warm with painful range of movements but no findings of acute arthritis. Skin: No rashes, lesions or ulcers Psychiatry: Judgement and insight appear normal. Mood & affect appropriate.     Data Reviewed: I have personally reviewed following labs and imaging studies  CBC: Recent Labs  Lab 02/08/18 1845 02/09/18 0118 02/10/18 0426 02/10/18 1226 02/11/18 0232  WBC 19.4* 20.4* 19.9*  --  18.8*  NEUTROABS 15.2* 14.9*  --   --   --   HGB 7.7* 6.8* 5.9* 8.7* 7.0*  HCT 24.1* 21.1* 18.0* 26.8* 21.3*  MCV 68.5* 68.1* 66.7*  --  70.1*  PLT 362 307 251  --  944   Basic Metabolic Panel: Recent Labs  Lab 02/08/18 1845 02/09/18 0118 02/10/18 0426 02/11/18 0232  NA 127* 129* 133* 135  K 5.5* 5.1 4.8 4.7  CL 98 100 106 108  CO2 16* 18* 19* 18*  GLUCOSE 128* 155* 109* 108*  BUN 55* 53* 50* 47*  CREATININE 5.11* 4.93* 4.41* 4.37*  CALCIUM 9.3 8.7* 8.5* 8.3*   Liver Function Tests: Recent Labs  Lab 02/08/18 1845 02/09/18 0118 02/11/18 0232  AST 35 24 15  ALT 15  11 10  ALKPHOS 104 88 66  BILITOT 1.6* 1.6* 1.3*  PROT 8.2* 7.1 6.0*  ALBUMIN 4.0 3.4* 2.6*   Coagulation Profile: Recent Labs  Lab 02/09/18 0118  INR 1.27   Cardiac Enzymes: Recent Labs  Lab 02/09/18 0118 02/09/18 0907  TROPONINI <0.03 <0.03   HbA1C: No results for input(s): HGBA1C in the last 72 hours. CBG: Recent Labs  Lab 02/10/18 2100 02/11/18 0814 02/11/18 0914 02/11/18 1206 02/11/18 1701  GLUCAP 214* 64* 104* 94 112*    Recent Results (from the past 240 hour(s))  MRSA PCR Screening     Status: None   Collection Time: 02/08/18 11:10 PM  Result Value Ref Range Status   MRSA by PCR NEGATIVE NEGATIVE Final    Comment:        The GeneXpert MRSA Assay (FDA approved for NASAL specimens only), is one component of a comprehensive MRSA colonization surveillance program. It is  not intended to diagnose MRSA infection nor to guide or monitor treatment for MRSA infections. Performed at [facility], [place] [address], [place], [place] [address]   Blood culture (routine x 2)     Status: None (Preliminary result)   Collection Time: 02/09/18  1:00 AM  Result Value Ref Range Status   Specimen Description BLOOD RIGHT ANTECUBITAL  Final   Special Requests   Final    BOTTLES DRAWN AEROBIC AND ANAEROBIC Blood Culture adequate volume   Culture   Final    NO GROWTH 2 DAYS Performed at [facility], [place] [address] [place], [place] [address]    Report Status PENDING  Incomplete  Blood culture (routine x 2)     Status: None (Preliminary result)   Collection Time: 02/09/18  1:15 AM  Result Value Ref Range Status   Specimen Description BLOOD RIGHT FOREARM  Final   Special Requests   Final    BOTTLES DRAWN AEROBIC AND ANAEROBIC Blood Culture adequate volume   Culture   Final    NO GROWTH 2 DAYS Performed at [facility], [place] [address], [place], [place] [address]    Report Status PENDING  Incomplete         Radiology Studies: No results found.      Scheduled Meds: . sodium chloride   Intravenous Once  . amLODipine  5 mg Oral Daily  . carvedilol  25 mg Oral BID WC  . folic acid  1 mg Oral Daily  . gabapentin  300 mg Oral TID  . heparin  5,000 Units Subcutaneous Q8H  . insulin aspart  0-9 Units Subcutaneous TID WC  . pantoprazole  40 mg Oral Daily  . polyethylene glycol  17 g Oral Daily  . senna  1 tablet Oral Daily  . tamsulosin  0.4 mg Oral Daily   Continuous Infusions:    LOS: 3 days     Vernell Leep, MD, FACP, [facility]. [facility] Pager [phone]  If 7PM-7AM, please contact night-coverage [email or web address] Password [facility] 02/11/2018, 5:44 PM

## 2018-02-11 NOTE — Progress Notes (Signed)
Reviewed [place] Substance Reporting system prior to prescribing opiate medications. No inconsistencies noted.   Meds ordered this encounter  Medications  . oxycodone (OXY-IR) 5 MG capsule    Sig: Take 1 capsule (5 mg total) by mouth every 4 (four) hours as needed for up to 3 days.    Dispense:  18 capsule    Refill:  0    Order Specific Question:   Supervising Provider    Answer:   Tresa Garter [[id number]]    Donia Pounds  APRN, MSN, FNP-C Patient [name] [address] [place], [place] [address] [phone]

## 2018-02-11 NOTE — Consult Note (Signed)
Reason for Consult: Possible sickle cell crisis Referring Physician:   PHEONIX Todd is an 67 y.o. male.   HPI:  [name], a 67 year old male with a medical history significant for sickle cell anemia, hemoglobin SS, type 2 diabetes mellitus, end-stage chronic kidney disease, recent vocal cord lesion with dysphonia, polysubstance abuse, GERD, and hypertension was admitted with possible sepsis.  Also, patient endorsed chest pain shortness of breath on admission.  Patient says that he has pain primarily to his joints and  left fistula.  He states that he has not been taking opiate pain medications.  His provider no longer prescribes opiate medications due to positive drug screens.  This admission, drug screen is positive for cocaine.  Patient states that he used cocaine the day before admission.  Current pain intensity is 6/10 and patient appears to be resting comfortably.  Patient is tolerating oral medications.  He is currently afebrile and oxygen saturation is 100% on room air. Past Medical History:  Diagnosis Date  . Arthritis   . Cancer ([place])    colon  . Diabetes mellitus   . GERD (gastroesophageal reflux disease)   . Heart murmur   . Hypertension   . Pneumonia   . Renal insufficiency   . Sickle cell anemia (HCC)    Hgb SS disease   . Wears glasses     Past Surgical History:  Procedure Laterality Date  . AV FISTULA PLACEMENT Left 11/22/2016   Procedure: INSERTION OF ARTERIOVENOUS (AV) GORE-TEX GRAFT  LEFT FOREARM USING 6MM X 50CM GORETEX GRAFT;  Surgeon: Justin Mitchell, MD;  Location: [facility];  Service: Vascular;  Laterality: Left;  . CATARACT EXTRACTION W/ INTRAOCULAR LENS  IMPLANT, BILATERAL    . CHOLECYSTECTOMY    . COLON SURGERY     Colon cancer surgery, removed small amt not full colectomy  . COLONOSCOPY     nclear who follows him for this  . LEG SURGERY     For ? gangrene after running into barbwire fence at 67yo    Family History  Problem Relation Age of Onset  .  Venous thrombosis Mother        Dec. of blood clot   . Diabetes type II Mother   . AAA (abdominal aortic aneurysm) Mother   . Throat cancer Father        Deceased of throat cancer    Social History:  reports that he quit smoking about 51 years ago. He has a 10.00 pack-year smoking history. He has never used smokeless tobacco. He reports that he drinks alcohol. He reports that he has current or past drug history. Drug: Cocaine.  Allergies:  Allergies  Allergen Reactions  . Morphine And Related Itching      Results for orders placed or performed during the hospital encounter of 02/08/18 (from the past 48 hour(s))  Glucose, capillary     Status: Abnormal   Collection Time: 02/09/18  3:59 PM  Result Value Ref Range   Glucose-Capillary 170 (H) 70 - 99 mg/dL  Glucose, capillary     Status: Abnormal   Collection Time: 02/09/18  8:42 PM  Result Value Ref Range   Glucose-Capillary 37 (LL) 70 - 99 mg/dL   Comment 1 Notify RN   Glucose, capillary     Status: None   Collection Time: 02/09/18  9:24 PM  Result Value Ref Range   Glucose-Capillary 89 70 - 99 mg/dL  Glucose, capillary     Status: Abnormal   Collection  Time: 02/10/18 12:08 AM  Result Value Ref Range   Glucose-Capillary 146 (H) 70 - 99 mg/dL  Glucose, capillary     Status: Abnormal   Collection Time: 02/10/18  4:21 AM  Result Value Ref Range   Glucose-Capillary 104 (H) 70 - 99 mg/dL  Vancomycin, random     Status: None   Collection Time: 02/10/18  4:26 AM  Result Value Ref Range   Vancomycin Rm 15     Comment:        Random Vancomycin therapeutic range is dependent on dosage and time of specimen collection. A peak range is 20.0-40.0 ug/mL A trough range is 5.0-15.0 ug/mL        Performed at [facility] [address], [place] [address]   CBC     Status: Abnormal   Collection Time: 02/10/18  4:26 AM  Result Value Ref Range   WBC 19.9 (H) 4.0 - 10.5 K/uL   RBC 2.70 (L) 4.22 - 5.81 MIL/uL    Hemoglobin 5.9 (LL) 13.0 - 17.0 g/dL    Comment: REPEATED TO VERIFY CRITICAL RESULT CALLED TO, READ BACK BY AND VERIFIED WITH: C.NUGENT,RN 0456 02/10/18 G.MCADOO    HCT 18.0 (L) 39.0 - 52.0 %   MCV 66.7 (L) 78.0 - 100.0 fL   MCH 21.9 (L) 26.0 - 34.0 pg   MCHC 32.8 30.0 - 36.0 g/dL   RDW 17.5 (H) 11.5 - 15.5 %   Platelets 251 150 - 400 K/uL    Comment: Performed at [facility] [address], [place], [place] [address]  Basic metabolic panel     Status: Abnormal   Collection Time: 02/10/18  4:26 AM  Result Value Ref Range   Sodium 133 (L) 135 - 145 mmol/L   Potassium 4.8 3.5 - 5.1 mmol/L   Chloride 106 98 - 111 mmol/L   CO2 19 (L) 22 - 32 mmol/L   Glucose, Bld 109 (H) 70 - 99 mg/dL   BUN 50 (H) 8 - 23 mg/dL   Creatinine, Ser 4.41 (H) 0.61 - 1.24 mg/dL   Calcium 8.5 (L) 8.9 - 10.3 mg/dL   GFR calc non Af Amer 12 (L) >60 mL/min   GFR calc Af Amer 14 (L) >60 mL/min    Comment: (NOTE) The eGFR has been calculated using the CKD EPI equation. This calculation has not been validated in all clinical situations. eGFR's persistently <60 mL/min signify possible Chronic Kidney Disease.    Anion gap 8 5 - 15    Comment: Performed at [facility] [address] [place], [place] [address]  Vitamin B12     Status: None   Collection Time: 02/10/18  4:44 AM  Result Value Ref Range   Vitamin B-12 230 180 - 914 pg/mL    Comment: (NOTE) This assay is not validated for testing neonatal or myeloproliferative syndrome specimens for Vitamin B12 levels. Performed at [facility], [place] [address], [place], [place] [address]   Iron and TIBC     Status: Abnormal   Collection Time: 02/10/18  4:44 AM  Result Value Ref Range   Iron 48 45 - 182 ug/dL   TIBC 214 (L) 250 - 450 ug/dL   Saturation Ratios 22 17.9 - 39.5 %   UIBC 166 ug/dL    Comment: Performed at [facility], [place] [address] [place], [place] [address]  Ferritin     Status: Abnormal   Collection Time: 02/10/18  4:44 AM  Result Value Ref Range   Ferritin 3,590 (H) 24 - 336 ng/mL    Comment: Performed at [facility] [address], [place], [place] [address]  Reticulocytes     Status: Abnormal   Collection Time: 02/10/18  4:44 AM  Result Value Ref Range   Retic Ct Pct 3.2 (H) 0.4 - 3.1 %   RBC. 2.72 (L) 4.22 - 5.81 MIL/uL   Retic Count, Absolute 87.0 19.0 - 186.0 K/uL    Comment: Performed at [facility] [address], [place], [place] [address]  Prepare RBC     Status: None   Collection Time: 02/10/18  5:12 AM  Result Value Ref Range   Order Confirmation      ORDER PROCESSED BY BLOOD BANK Performed at [facility], [place] [address], [place], [place] [address]   Glucose, capillary     Status: Abnormal   Collection Time: 02/10/18  7:27 AM  Result Value Ref Range   Glucose-Capillary 39 (LL) 70 - 99 mg/dL   Comment 1 Notify RN   Glucose, capillary     Status: Abnormal   Collection Time: 02/10/18  8:33 AM  Result Value Ref Range   Glucose-Capillary 151 (H) 70 - 99 mg/dL  Glucose, capillary     Status: None   Collection Time: 02/10/18 11:18 AM  Result Value Ref Range   Glucose-Capillary 97 70 - 99 mg/dL  Lactate dehydrogenase     Status: Abnormal   Collection Time: 02/10/18 12:25 PM  Result Value Ref Range   LDH 314 (H) 98 - 192 U/L    Comment: Performed at [facility], [place] [address], [place], [place] [address]  Hemoglobin and hematocrit, blood     Status: Abnormal   Collection Time: 02/10/18 12:26 PM  Result Value Ref Range   Hemoglobin 8.7 (L) 13.0 - 17.0 g/dL    Comment: POST TRANSFUSION SPECIMEN   HCT 26.8 (L) 39.0 - 52.0 %    Comment: Performed at [facility] [address], [place], [place] [address]  Glucose, capillary     Status: Abnormal   Collection Time: 02/10/18  4:09 PM  Result Value Ref Range   Glucose-Capillary 125 (H) 70 - 99 mg/dL  Glucose, capillary     Status: Abnormal   Collection Time: 02/10/18  9:00 PM  Result Value Ref  Range   Glucose-Capillary 214 (H) 70 - 99 mg/dL  CBC     Status: Abnormal   Collection Time: 02/11/18  2:32 AM  Result Value Ref Range   WBC 18.8 (H) 4.0 - 10.5 K/uL   RBC 3.04 (L) 4.22 - 5.81 MIL/uL   Hemoglobin 7.0 (L) 13.0 - 17.0 g/dL   HCT 21.3 (L) 39.0 - 52.0 %   MCV 70.1 (L) 78.0 - 100.0 fL   MCH 23.0 (L) 26.0 - 34.0 pg   MCHC 32.9 30.0 - 36.0 g/dL   RDW 20.2 (H) 11.5 - 15.5 %   Platelets 270 150 - 400 K/uL    Comment: Performed at [facility], [place]. [address] [place],  [address]  Comprehensive metabolic panel     Status: Abnormal   Collection Time: 02/11/18  2:32 AM  Result Value Ref Range   Sodium 135 135 - 145 mmol/L   Potassium 4.7 3.5 - 5.1 mmol/L   Chloride 108 98 - 111 mmol/L   CO2 18 (L) 22 - 32 mmol/L   Glucose,  Bld 108 (H) 70 - 99 mg/dL   BUN 47 (H) 8 - 23 mg/dL   Creatinine, Ser 4.37 (H) 0.61 - 1.24 mg/dL   Calcium 8.3 (L) 8.9 - 10.3 mg/dL   Total Protein 6.0 (L) 6.5 - 8.1 g/dL   Albumin 2.6 (L) 3.5 - 5.0 g/dL   AST 15 15 - 41 U/L   ALT 10 0 - 44 U/L   Alkaline Phosphatase 66 38 - 126 U/L   Total Bilirubin 1.3 (H) 0.3 - 1.2 mg/dL   GFR calc non Af Amer 13 (L) >60 mL/min   GFR calc Af Amer 15 (L) >60 mL/min    Comment: (NOTE) The eGFR has been calculated using the CKD EPI equation. This calculation has not been validated in all clinical situations. eGFR's persistently <60 mL/min signify possible Chronic Kidney Disease.    Anion gap 9 5 - 15    Comment: Performed at [facility] [address], [place], [place] [address]  Glucose, capillary     Status: Abnormal   Collection Time: 02/11/18  8:14 AM  Result Value Ref Range   Glucose-Capillary 64 (L) 70 - 99 mg/dL   Comment 1 Notify RN    Comment 2 Document in Chart   Glucose, capillary     Status: Abnormal   Collection Time: 02/11/18  9:14 AM  Result Value Ref Range   Glucose-Capillary 104 (H) 70 - 99 mg/dL   Comment 1 Notify RN    Comment 2 Document in Chart   Glucose, capillary      Status: None   Collection Time: 02/11/18 12:06 PM  Result Value Ref Range   Glucose-Capillary 94 70 - 99 mg/dL   Comment 1 Notify RN    Comment 2 Document in Chart     No results found.  Review of Systems  Constitutional: Negative for chills and fever.  HENT: Negative.   Eyes: Negative.   Respiratory: Negative.   Cardiovascular: Negative.   Gastrointestinal: Negative.   Genitourinary: Negative.   Musculoskeletal: Positive for joint pain.  Skin: Negative.   Neurological: Negative.   Endo/Heme/Allergies: Negative.   Psychiatric/Behavioral: Negative.    Blood pressure (!) 145/69, pulse 71, temperature 98.6 F (37 C), temperature source Oral, resp. rate 20, height '6\' 1"'$  (1.854 m), weight 82.6 kg, SpO2 100 %. Physical Exam  Constitutional: He is oriented to person, place, and time. He appears well-developed and well-nourished.  HENT:  Head: Normocephalic.  Right Ear: External ear normal.  Mouth/Throat: Oropharynx is clear and moist.  Cardiovascular: Normal rate, normal heart sounds and intact distal pulses.  No murmur heard. Respiratory: Effort normal and breath sounds normal. No respiratory distress.  GI: Soft. Bowel sounds are normal.  Musculoskeletal: Normal range of motion.  Neurological: He is alert and oriented to person, place, and time.  Skin: Skin is warm and dry.  Psychiatric: He has a normal mood and affect. His behavior is normal. Thought content normal.    Assessment/Plan:  Generalized arthralgia: Patient is complaining of generalized joint pain.   Patient also endorses history of arthritis.  Patient is mostly opiate nave, due to the fact that he is not been prescribed pain medications over the past several months.  I suspect that pain is mostly chronic in nature due to underlying arthritis.  We will refrain from initiating IV pain medications due to polysubstance abuse.   Recommend oxycodone 10 mg every 4 hours for moderate to severe pain.  Patient will be  discharged  home on oxycodone 5 mg #18  immediate release every 4 hours as needed.   Sickle cell anemia, hemoglobin SS: I suspect that pain is mostly chronic in nature due to underlying arthritis.  Patient was treated supportively with IV fluids and pain management.  Anemia of chronic disease: Hemoglobin decreased to 5.9, patient was transfused 1 unit of packed red blood cells.  Hemoglobin is currently stable at 7.0. Currently asymptomatic.  Recommend outpatient follow-up for CBC in 1 week. Dr. Algis Liming is aware of recommendations.    Cocaine abuse: Patient denied discussed current cocaine abuse.  Counseled on cessation.  Last used 1 day prior to admission.  Patient referred to [facility] sickle cell agency to assist with polysubstance abuse treatment.  Leukocytosis:  On reviewing history, patient appears to have chronic leukocytosis. Sudden elevation may be reactive in nature due to [facility].  Outpatient CBC recommended.   Suspected sepsis versus SIRS: Patient met sepsis criteria on admission.  Currently, the source is unclear.  Patient was treated with IV fluids and empirically with IV cefepime, vancomycin, and metronidazole. Low-grade fevers resolved, all IV antibiotics were discontinued.    Donia Pounds  APRN, MSN, FNP-C Patient [facility] [address] [place], [place] [address] [phone]  02/11/2018, 12:52 PM

## 2018-02-12 LAB — CBC
HCT: 24 % — ABNORMAL LOW (ref 39.0–52.0)
Hemoglobin: 7.6 g/dL — ABNORMAL LOW (ref 13.0–17.0)
MCH: 22.7 pg — ABNORMAL LOW (ref 26.0–34.0)
MCHC: 31.7 g/dL (ref 30.0–36.0)
MCV: 71.6 fL — ABNORMAL LOW (ref 78.0–100.0)
PLATELETS: 252 10*3/uL (ref 150–400)
RBC: 3.35 MIL/uL — AB (ref 4.22–5.81)
RDW: 21.2 % — AB (ref 11.5–15.5)
WBC: 17.5 10*3/uL — ABNORMAL HIGH (ref 4.0–10.5)

## 2018-02-12 LAB — GLUCOSE, CAPILLARY
GLUCOSE-CAPILLARY: 99 mg/dL (ref 70–99)
GLUCOSE-CAPILLARY: 111 mg/dL — AB (ref 70–99)
GLUCOSE-CAPILLARY: 189 mg/dL — AB (ref 70–99)

## 2018-02-12 NOTE — Progress Notes (Addendum)
Pt has known vocal cord lesion and states he was suppose to have surgery on it but was here in hospital.       After PT worked with pt, had RT see pt and assess respiratory status. At that time pt was not wheezing or coughing, RT stated lungs clear and sat 97% on RA, pt up in chair.   Noted pt had a piece of the [place] sausage in a napkin where he had tried to chew, pt only has few teeth to lower front gums, and had spit out, pt states he had trouble chewing it.   Later am pt given am meds with water. Pt took each pill separately, drank water and had no problems swallowing, no wheezing, no coughing.   Pt now being observed eating lunch,  encouraged pt to eat small bites, and chew as well as he can (only has a few teeth to lower front gums). Pt does have some upper airway wheezing while he is chewing food, when swallows has no distress, does clear his throat from time to time. sats monitored while eating, 96-100%.   (Pt's lunch chicken and dumplings, pt getting tired chewing already chopped chicken with gravy and soft dumplings.)    Had pt to stop eating, had him breath thru his nose in and out and then thru nose and mouth,  No wheezing heard during these breathing exercises.  Pt started back eating his cake, pt not having any wheezing or coughing or clearing throat with soft cake.   sats continue to be 96-99%.   Will inform Dr Algis Liming.

## 2018-02-12 NOTE — Care Management Note (Signed)
Case Management Note  Patient Details  Name: [name] MRN: [id number] Date of Birth: 1950/08/02  Subjective/Objective:   Pt admitted with SCC crisis - ESRD disease not yet on HD               Action/Plan:   PTA independent from home alone, pt informed CM that his ex wife provides assistance/support if needed.  Pt has a walker and a cane in the home - not needing to use them PTA.  Pt has PCP and declined hardship with affording medications as prescribed.   Expected Discharge Date:  02/12/18               Expected Discharge Plan:  Home/Self Care  In-House Referral:     Discharge planning Services  CM Consult  Post Acute Care Choice:    Choice offered to:     DME Arranged:    DME Agency:     HH Arranged:    HH Agency:     Status of Service:  In process, will continue to follow  If discussed at Long Length of Stay Meetings, dates discussed:    Additional Comments:  Maryclare Labrador, RN 02/12/2018, 3:10 PM

## 2018-02-12 NOTE — Progress Notes (Addendum)
Discharge instructions reviewed with pt and daughter.  Copy of instructions given to pt, 1 script sent in electronically to pt's pharmacy. CHF instructions reviewed with pt, pt has prn orders for lasix pending weight changes, pt knows to weigh self daily and take lasix per instructions.   At 1815 Pt d/c'd via wheelchair with belongings, with family.             Escorted by unit staff.

## 2018-02-12 NOTE — Discharge Summary (Signed)
Physician Discharge Summary  [name] KGM:[id number] DOB: 06/24/1950  PCP: Nolene Ebbs, MD  Admit date: 02/08/2018 Discharge date: 02/12/2018  Recommendations for Outpatient Follow-up:  1. Dr. Nolene Ebbs, PCP in 5 days with repeat labs (CBC & BMP). 2. Dr. Donato Heinz, Nephrology in 1 week. 3. Dr. Melida Quitter, ENT: Patient and family advised to call 02/13/2018 to reschedule outpatient procedure that patient missed on 02/12/2018. 4. Recommend outpatient GI consultation to further evaluate abnormal findings noted on CT abdomen this admission and esophageal work-up (discussed below)   Home Health: None Equipment/Devices: None.  Patient has a walker and a cane at home.  Discharge Condition: Improved and stable CODE STATUS: Full Diet recommendation: Heart healthy & diabetic diet.  Diet consistency recommendations as per speech therapy as follows:  Dysphagia 2 (Fine chop);Thin liquid   Liquid Administration via: Cup;Straw Medication Administration: Whole meds with liquid Supervision: Patient able to self feed Compensations: Minimize environmental distractions;Small sips/bites;Slow rate Postural Changes: Seated upright at 90 degrees;Remain upright for at least 30 minutes after po intake    Discharge Diagnoses:  Principal Problem:   Renal failure (ARF), acute on chronic (HCC) Active Problems:   Type 2 diabetes mellitus with renal complication (HCC)   Leukocytosis   Hyperkalemia, diminished renal excretion   Cocaine abuse (HCC)   CKD (chronic kidney disease) stage 5, GFR less than 15 ml/min (HCC)   Sickle cell pain crisis ([place])   Sepsis ([place])   Brief Summary: 67 year old male with PMH of Hb SS disease, DM 2, stage IV chronic kidney disease (Dr. Marval Regal, Nephrology), HTN, GERD, polysubstance abuse, recent vocal cord lesion with dysphonia, reportedly used cocaine night prior to admission and then presented with abdominal pain, chest pain, dyspnea and leg pains.  In  the ED, reportedly had sepsis criteria, acute on chronic kidney disease, leukocytosis and dehydration.  He was febrile at 101.5, RR 25, WBC 19.4.   Assessment & Plan:   SIRS: Met SIRS criteria on admission.  Source unclear.?  GI related to abdominal CT findings although did not have nausea, vomiting or diarrhea.  No urinary or respiratory symptoms reported.?  Related to cocaine abuse.  Urine microscopy not suggestive of UTI.  Blood cultures x2: Negative to date.  Chest x-ray without focal airspace disease.  Treated with IV fluids and started empirically on IV cefepime, vancomycin and metronidazole.  No clear convincing source of sepsis.  Low-grade fevers could be related to sickle cell crisis.  Discontinued all antibiotics and monitor.  SIRS resolved.   Abdominal pain: CT abdomen (without contrast) 9/6 showed pathologically enlarged lymph nodes in the right retroperitoneum along the right iliac chain and posterior to the cecum with questionable mucosal thickening versus adherent stool to the walls of the cecum.  Patient however lacks other GI symptoms.  Patient states that he had colonoscopy approximately 7 years ago at this hospital.  However unable to find report in chart review.  I discussed with GI MD on call 9/7, CT findings are nonspecific, treat supportively for now and monitor.  Recommend outpatient GI follow-up for colonoscopy.  Resolved.  Hb SS sickle cell crisis: Treated supportively with IV fluids and pain management. Transfused 1 unit PRBC for hemoglobin of 5.9 on 9/8.    LDH mildly elevated at 314.  Reticulocytes 87.  Patient reported pain in both elbows, wrists and knees consistent with prior episodes of sickle cell crisis.  Sickle cell team was consulted and adjusted pain medicines.  Symptoms significantly improved and able to  ambulate without difficulty.  Sickle cell crisis seems to have resolved.  Hemoglobin 7.6 on day of discharge.  Cocaine abuse: Cessation counseled.  Last used  cocaine day prior to admission.  Patient reports that he had been clean from cocaine for 2 years until a month PTA.  Acute on stage IV chronic kidney disease: Baseline creatinine not clear but may be in the low 4 range.  Presented with creatinine of 5.11.  Has left forearm AV fistula.  Not yet on dialysis.  Hydrated gently with IV fluids.  Creatinine has come down to 4.37.  Outpatient follow-up with nephrology.  Leukocytosis: Patient appears to have chronic leukocytosis, most likely related to his hemoglobin SS disease.    This was slightly higher than baseline possibly reactive but has improved.  Stable.  Anemia: Chronic.  Multifactorial related to underlying hemoglobin SS disease and chronic kidney disease.  Baseline hemoglobin probably in the 7 g range.  Hemoglobin had dropped to 5.9 and received 1 unit PRBC 9/8.  Anemia panel reviewed: Reticulocytes 87, ferritin 3590 (451 in June 2018), iron 48, folate 35, B12: 230.?  Consider desferrioxamine.  Hemoglobin on day of discharge: 7.6.    Type II DM with renal complications: Hypoglycemic episode 9/7 and 9/8.  Despite discontinuing glipizide, Tradjenta and reducing Lantus, had hypoglycemic episode again 9/8.  Discontinued Lantus.  Patient had another low CBG 64 on 9/9 morning.    At discharge, patient and daughter counseled that patient should only continue SSI, maintain CBG logs and follow-up with PCP for further adjustment.  They verbalized understanding.  No further hypoglycemic episodes over the last 24 hours.  Hyperkalemia: Resolved.  Atypical chest pain: Possibly due to cocaine abuse.  Resolved.  Troponin x 2: Neg.  Essential hypertension: Mildly uncontrolled at times.  Continue amlodipine, carvedilol.  GERD: Protonix.  Dysphagia: On day of discharge, RN noted that patient was having upper airway wheezing only with oral intake.  He apparently has a known vocal cord lesion and was supposed to have surgery 9/10 (did not mention it to [place]  until today).  Speech therapy consulted and recommended modified diet and outpatient esophageal evaluation for dysmotility issues.  I examined patient at bedside and no clinical bronchospasm or upper airway wheezing currently.  Vocal cord lesion: Unclear pathology.   Patient and daughter advised to call ENT in a.m. to reschedule procedure and they verbalized understanding.  Sclerotic appearance of osseous structure on CT abdomen: Suspected due to sickle cell disease but metastatic disease not ruled out.  Follow-up with PCP and evaluate as deemed necessary.  Consultants:  Sickle cell team  Procedures:  None   Discharge Instructions  Discharge Instructions    (HEART FAILURE PATIENTS) Call MD:  Anytime you have any of the following symptoms: 1) 3 pound weight gain in 24 hours or 5 pounds in 1 week 2) shortness of breath, with or without a dry hacking cough 3) swelling in the hands, feet or stomach 4) if you have to sleep on extra pillows at night in order to breathe.   Complete by:  As directed    Call MD for:  difficulty breathing, headache or visual disturbances   Complete by:  As directed    Call MD for:  extreme fatigue   Complete by:  As directed    Call MD for:  persistant dizziness or light-headedness   Complete by:  As directed    Call MD for:  persistant nausea and vomiting   Complete by:  As  directed    Call MD for:  severe uncontrolled pain   Complete by:  As directed    Call MD for:  temperature >100.4   Complete by:  As directed    Diet - low sodium heart healthy   Complete by:  As directed    Dysphagia 2 (Fine chop);Thin liquid   Liquid Administration via: Cup;Straw Medication Administration: Whole meds with liquid Supervision: Patient able to self feed Compensations: Minimize environmental distractions;Small sips/bites;Slow rate Postural Changes: Seated upright at 90 degrees;Remain upright for at least 30 minutes after po intake   Diet Carb Modified   Complete  by:  As directed    Increase activity slowly   Complete by:  As directed        Medication List    STOP taking these medications   glipiZIDE 10 MG 24 hr tablet Commonly known as:  GLUCOTROL XL   insulin glargine 100 unit/mL Sopn Commonly known as:  LANTUS   saxagliptin HCl 5 MG Tabs tablet Commonly known as:  ONGLYZA     TAKE these medications   acetaminophen 500 MG tablet Commonly known as:  TYLENOL Take 500 mg by mouth every 6 (six) hours as needed for headache (pain).   amLODipine 5 MG tablet Commonly known as:  NORVASC Take 5 mg by mouth daily.   atorvastatin 20 MG tablet Commonly known as:  LIPITOR Take 20 mg by mouth every evening.   carvedilol 25 MG tablet Commonly known as:  COREG Take 25 mg by mouth 2 (two) times daily.   folic acid 1 MG tablet Commonly known as:  FOLVITE Take 1 tablet (1 mg total) by mouth daily.   furosemide 80 MG tablet Commonly known as:  LASIX Take 0.5 tablets (40 mg total) by mouth daily as needed for edema (Weight more than 2 pounds above 177 pounds).   gabapentin 300 MG capsule Commonly known as:  NEURONTIN Take 300 mg by mouth at bedtime.   NOVOLOG FLEXPEN 100 UNIT/ML FlexPen Generic drug:  insulin aspart Inject 2-6 Units into the skin 3 (three) times daily as needed for high blood sugar (CBG >150).   omeprazole 20 MG capsule Commonly known as:  PRILOSEC Take 20 mg by mouth daily.   oxycodone 5 MG capsule Commonly known as:  OXY-IR Take 1 capsule (5 mg total) by mouth every 4 (four) hours as needed for up to 3 days.   polyethylene glycol powder powder Commonly known as:  GLYCOLAX/MIRALAX Take 17 g by mouth daily. What changed:    when to take this  reasons to take this  additional instructions   SENNA LAXATIVE PO Take 1 tablet by mouth daily as needed (constipation).   STOOL SOFTENER PO Take 1 capsule by mouth daily as needed (constipation).   tamsulosin 0.4 MG Caps capsule Commonly known as:   FLOMAX Take 1 capsule (0.4 mg total) by mouth daily.   tiZANidine 4 MG tablet Commonly known as:  ZANAFLEX Take 4 mg by mouth 2 (two) times daily as needed for muscle spasms.   VENTOLIN HFA 108 (90 Base) MCG/ACT inhaler Generic drug:  albuterol Inhale 2 puffs into the lungs every 6 (six) hours as needed for wheezing.      Follow-up Information    Nolene Ebbs, MD. Schedule an appointment as soon as possible for a visit in 5 day(s).   Specialty:  Internal Medicine Why:  To be seen with repeat labs (CBC & BMP). Contact information: [place]  [address] [phone]        Donato Heinz, MD. Schedule an appointment as soon as possible for a visit in 1 week(s).   Specialty:  Nephrology Contact information: [place] [address] [phone]        Melida Quitter, MD. Schedule an appointment as soon as possible for a visit.   Specialty:  Otolaryngology Why:  Please call office on 02/13/2018 to reschedule procedure that you missed while hospitalized. Contact information: [address] [place] [address] [phone]          Allergies  Allergen Reactions  . Morphine And Related Itching      Procedures/Studies: Ct Abdomen Pelvis Wo Contrast  Result Date: 02/08/2018 CLINICAL DATA:  Abdominal distension.  History of colon cancer. EXAM: CT ABDOMEN AND PELVIS WITHOUT CONTRAST TECHNIQUE: Multidetector CT imaging of the abdomen and pelvis was performed following the standard protocol without IV contrast. COMPARISON:  11/04/2016 FINDINGS: Lower chest: Scarring in the anterior aspect of the right middle lobe and posterolateral aspect of the left lower lobe. Hepatobiliary: No focal liver abnormality is seen. Status post cholecystectomy. No biliary dilatation. Pancreas: Unremarkable. No pancreatic ductal dilatation or surrounding inflammatory changes. Spleen: Contracted calcified spleen, chronic finding. Adrenals/Urinary  Tract: Adrenal glands are unremarkable. Kidneys are normal, without renal calculi, focal lesion, or hydronephrosis. Bladder is markedly distended. Stomach/Bowel: Questionable mucosal thickening versus formed stool adherent to the walls of the cecum. The bowel is suboptimally visualized due to lack of opacification. Vascular/Lymphatic: Enlarged lymph nodes along the right iliac chain and posterior to the cecum, the largest measuring 2 cm Reproductive: The prostate gland is normal in size. Other: No abdominal wall hernia or abnormality. No abdominopelvic ascites. Musculoskeletal: Markedly heterogeneous sclerotic appearance of the osseous structures. IMPRESSION: Interval development of pathologically enlarged lymph nodes in the right retroperitoneum along the right iliac chain and posterior to the cecum. Questionable mucosal thickening versus adherent stool to the walls of the cecum. Marked dilation of the urinary bladder. Please correlate clinically. Marked heterogeneous mottled sclerotic appearance of the osseous structures. This may be due to sickle cell disease or represents diffuse metastatic disease. Electronically Signed   By: Fidela Salisbury M.D.   On: 02/08/2018 20:52   Dg Chest Port 1 View  Result Date: 02/08/2018 CLINICAL DATA:  Shortness of breath. EXAM: PORTABLE CHEST 1 VIEW COMPARISON:  One-view chest x-ray 11/13/2017 FINDINGS: Heart is enlarged. Mild pulmonary vascular congestion is present. No focal airspace disease present. There are no definite effusions. Mild degenerative changes are present at the shoulders bilaterally. Sclerotic changes are present at both humeral heads, compatible with known sickle cell disease. IMPRESSION: 1. Cardiomegaly and moderate pulmonary vascular congestion without frank edema. 2. No focal airspace disease. 3. Sclerotic changes at the humeral heads compatible with sickle cell disease. Electronically Signed   By: San Morelle M.D.   On: 02/08/2018 19:23       Subjective: Patient overall feels much better.  His joint pains have significantly improved and he was able to ambulate with PT.  No dyspnea or chest pain reported.  No nausea, vomiting, abdominal pain or diarrhea.  Discharge Exam:  Vitals:   02/11/18 2300 02/12/18 0816 02/12/18 1126 02/12/18 1459  BP: (!) 157/79 (!) 159/82 (!) 142/82 (!) 161/88  Pulse: 84 83 80 77  Resp:  '18 17 18  ' Temp: 99.5 F (37.5 C) 98.7 F (37.1 C) 98.2 F (36.8 C) 98.7 F (37.1 C)  TempSrc: Oral Oral Oral Oral  SpO2:  99% 99% 96%  Weight:      Height:        General exam: Pleasant middle-aged male, moderately built and nourished, lying comfortably propped up in bed.   Respiratory system: Clear to auscultation. Respiratory effort normal.   Cardiovascular system: S1 & S2 heard, RRR. No JVD, murmurs, rubs, gallops or clicks. No pedal edema.   Gastrointestinal system: Abdomen is nondistended, soft.  No abdominal tenderness.  No organomegaly or masses appreciated.  Normal bowel sounds heard.   Central nervous system: Alert and oriented. No focal neurological deficits.   Extremities: Symmetric 5 x 5 power.  Bilateral elbows, wrists and knees, have improved with no further increased warmth, much improved range of movements which are not painful at this time. Skin: No rashes, lesions or ulcers Psychiatry: Judgement and insight appear normal. Mood & affect appropriate.     The results of significant diagnostics from this hospitalization (including imaging, microbiology, ancillary and laboratory) are listed below for reference.     Microbiology: Recent Results (from the past 240 hour(s))  MRSA PCR Screening     Status: None   Collection Time: 02/08/18 11:10 PM  Result Value Ref Range Status   MRSA by PCR NEGATIVE NEGATIVE Final    Comment:        The GeneXpert MRSA Assay (FDA approved for NASAL specimens only), is one component of a comprehensive MRSA colonization surveillance program. It is  not intended to diagnose MRSA infection nor to guide or monitor treatment for MRSA infections. Performed at [facility], [place] [address], [place], [place] [address]   Blood culture (routine x 2)     Status: None (Preliminary result)   Collection Time: 02/09/18  1:00 AM  Result Value Ref Range Status   Specimen Description BLOOD RIGHT ANTECUBITAL  Final   Special Requests   Final    BOTTLES DRAWN AEROBIC AND ANAEROBIC Blood Culture adequate volume   Culture   Final    NO GROWTH 3 DAYS Performed at [facility], [place] [address], [place], [place] [address]    Report Status PENDING  Incomplete  Blood culture (routine x 2)     Status: None (Preliminary result)   Collection Time: 02/09/18  1:15 AM  Result Value Ref Range Status   Specimen Description BLOOD RIGHT FOREARM  Final   Special Requests   Final    BOTTLES DRAWN AEROBIC AND ANAEROBIC Blood Culture adequate volume   Culture   Final    NO GROWTH 3 DAYS Performed at [facility], [place] [address], [place], [place] [address]    Report Status PENDING  Incomplete     Labs: CBC: Recent Labs  Lab 02/08/18 1845 02/09/18 0118 02/10/18 0426 02/10/18 1226 02/11/18 0232 02/12/18 1307  WBC 19.4* 20.4* 19.9*  --  18.8* 17.5*  NEUTROABS 15.2* 14.9*  --   --   --   --   HGB 7.7* 6.8* 5.9* 8.7* 7.0* 7.6*  HCT 24.1* 21.1* 18.0* 26.8* 21.3* 24.0*  MCV 68.5* 68.1* 66.7*  --  70.1* 71.6*  PLT 362 307 251  --  270 902   Basic Metabolic Panel: Recent Labs  Lab 02/08/18 1845 02/09/18 0118 02/10/18 0426 02/11/18 0232  NA 127* 129* 133* 135  K 5.5* 5.1 4.8 4.7  CL 98 100 106 108  CO2 16* 18* 19* 18*  GLUCOSE 128* 155* 109* 108*  BUN 55* 53* 50* 47*  CREATININE 5.11* 4.93* 4.41* 4.37*  CALCIUM 9.3 8.7* 8.5* 8.3*   Liver Function Tests: Recent Labs  Lab 02/08/18 1845 02/09/18 0118 02/11/18 0232  AST 35 24 15  ALT '15 11 10  ' ALKPHOS 104 88 66  BILITOT 1.6* 1.6* 1.3*  PROT 8.2* 7.1 6.0*  ALBUMIN 4.0 3.4*  2.6*   Cardiac Enzymes: Recent Labs  Lab 02/09/18 0118 02/09/18 0907  TROPONINI <0.03 <0.03   CBG: Recent Labs  Lab 02/11/18 1206 02/11/18 1701 02/11/18 2151 02/12/18 0815 02/12/18 1126  GLUCAP 94 112* 133* 99 111*   Anemia work up Recent Labs    02/10/18 0444  VITAMINB12 230  FERRITIN 3,590*  TIBC 214*  IRON 48  RETICCTPCT 3.2*   Urinalysis    Component Value Date/Time   COLORURINE YELLOW 02/08/2018 2137   APPEARANCEUR CLEAR 02/08/2018 2137   LABSPEC 1.010 02/08/2018 2137   PHURINE 5.0 02/08/2018 2137   GLUCOSEU NEGATIVE 02/08/2018 2137   HGBUR SMALL (A) 02/08/2018 2137   BILIRUBINUR NEGATIVE 02/08/2018 2137   [place] NEGATIVE 02/08/2018 2137   PROTEINUR 30 (A) 02/08/2018 2137   UROBILINOGEN 1.0 09/06/2014 2203   NITRITE NEGATIVE 02/08/2018 2137   LEUKOCYTESUR NEGATIVE 02/08/2018 2137    Discussed in detail with patient's daughter at bedside.  Updated care and answered questions.  Time coordinating discharge: 40 minutes  SIGNED:  Vernell Leep, MD, FACP, [facility]. [facility] Pager [phone]  If 7PM-7AM, please contact night-coverage [email or web address] Password [email or web address] 02/12/2018, 5:16 PM

## 2018-02-12 NOTE — Evaluation (Signed)
Physical Therapy Evaluation Patient Details Name: [name] MRN: [id number] DOB: 1951-02-28 Today's Date: 02/12/2018   History of Present Illness  [name] is a 67 y.o. male with medical history significant of sickle cell disease, diabetes, chronic kidney disease stage IV, recent vocal cord lesion with dysphonia, polysubstance abuse, GERD and hypertension who came to the ER today with abdominal pain as well as leg pain.   Clinical Impression  Pt admitted with above. Pt with known vocal lesion however observed pt consistently coughing and wheezing throughout eating breakfast. Would recommend a speech evaluation for swallowing. Notified RN. Pt was able to tolerate amb with RW with min guard. SpO2 85-88% on RA during ambulation. Acute PT to cont to follow.    Follow Up Recommendations No PT follow up;Supervision - Intermittent    Equipment Recommendations  None recommended by PT(reports he has walker at home)    Recommendations for Other Services Speech consult(for swallowing)     Precautions / Restrictions Precautions Precautions: Fall Restrictions Weight Bearing Restrictions: No      Mobility  Bed Mobility Overal bed mobility: Modified Independent             General bed mobility comments: HOB elevated, no physical assist needed  Transfers Overall transfer level: Needs assistance Equipment used: None Transfers: Sit to/from Stand Sit to Stand: Min guard         General transfer comment: increased time but no physical assist needed  Ambulation/Gait Ambulation/Gait assistance: Min guard;Min assist Gait Distance (Feet): 130 Feet Assistive device: Rolling walker (2 wheeled);None Gait Pattern/deviations: Step-through pattern;Decreased stride length;Trunk flexed Gait velocity: slow   General Gait Details: began amb without AD, pt with short step length and height and wide base of support, when give RW pt with improved fluidity of gait, increased step height  and length, and increased stability, pt agreed he felt more comfortable with RW at this time  Stairs            Wheelchair Mobility    Modified Rankin (Stroke Patients Only)       Balance Overall balance assessment: Mild deficits observed, not formally tested                                           Pertinent Vitals/Pain Pain Assessment: 0-10 Pain Score: 5  Pain Location: bilat LEs behind the knee Pain Descriptors / Indicators: Dull Pain Intervention(s): Monitored during session    Home Living Family/patient expects to be discharged to:: Private residence Living Arrangements: Alone Available Help at Discharge: Family;Friend(s);Available PRN/intermittently Type of Home: Apartment Home Access: Level entry     Home Layout: One level Home Equipment: Walker - 2 wheels;Walker - 4 wheels;Cane - single point;Grab bars - tub/shower      Prior Function Level of Independence: Independent with assistive device(s)         Comments: ex wife does the grocery shopping, doesn't drive, can dress and bath     Hand Dominance   Dominant Hand: Right    Extremity/Trunk Assessment   Upper Extremity Assessment Upper Extremity Assessment: Generalized weakness    Lower Extremity Assessment Lower Extremity Assessment: Generalized weakness    Cervical / Trunk Assessment Cervical / Trunk Assessment: Kyphotic  Communication   Communication: No difficulties  Cognition Arousal/Alertness: Awake/alert Behavior During Therapy: WFL for tasks assessed/performed Overall Cognitive Status: Within Functional Limits for tasks assessed  General Comments General comments (skin integrity, edema, etc.): pt with noted coughing and wheezing while eating breakfast, RN notified    Exercises     Assessment/Plan    PT Assessment Patient needs continued PT services  PT Problem List Decreased strength;Decreased  activity tolerance;Decreased balance;Decreased mobility;Decreased knowledge of use of DME       PT Treatment Interventions DME instruction;Gait training;Functional mobility training;Therapeutic activities;Therapeutic exercise;Balance training    PT Goals (Current goals can be found in the Care Plan section)  Acute Rehab PT Goals Patient Stated Goal: home PT Goal Formulation: With patient Time For Goal Achievement: 02/26/18 Potential to Achieve Goals: Good    Frequency Min 3X/week   Barriers to discharge Decreased caregiver support has intermittent supervision    Co-evaluation               AM-PAC PT "6 Clicks" Daily Activity  Outcome Measure Difficulty turning over in bed (including adjusting bedclothes, sheets and blankets)?: None Difficulty moving from lying on back to sitting on the side of the bed? : None Difficulty sitting down on and standing up from a chair with arms (e.g., wheelchair, bedside commode, etc,.)?: A Little Help needed moving to and from a bed to chair (including a wheelchair)?: A Little Help needed walking in hospital room?: A Little Help needed climbing 3-5 steps with a railing? : A Little 6 Click Score: 20    End of Session Equipment Utilized During Treatment: Gait belt Activity Tolerance: Patient tolerated treatment well Patient left: in chair;with call bell/phone within reach Nurse Communication: Mobility status PT Visit Diagnosis: Unsteadiness on feet (R26.81)    Time: 0223-3612 PT Time Calculation (min) (ACUTE ONLY): 26 min   Charges:   PT Evaluation $PT Eval Moderate Complexity: 1 Mod PT Treatments $Gait Training: 8-22 mins        Kittie Plater, PT, DPT Acute Rehabilitation Services Pager #: [phone] Office #: [phone]   Berline Lopes 02/12/2018, 10:24 AM

## 2018-02-12 NOTE — Progress Notes (Signed)
Patient ambulated around room with use of walker, did well no pain complaints at present.

## 2018-02-12 NOTE — Discharge Instructions (Addendum)

## 2018-02-12 NOTE — Evaluation (Signed)
Clinical/Bedside Swallow Evaluation Patient Details  Name: [name] MRN: [id number] Date of Birth: Dec 29, 1950  Today's Date: 02/12/2018 Time: SLP Start Time (ACUTE ONLY): 1600 SLP Stop Time (ACUTE ONLY): 1630 SLP Time Calculation (min) (ACUTE ONLY): 30 min  Past Medical History:  Past Medical History:  Diagnosis Date  . Arthritis   . Cancer ([place])    colon  . Diabetes mellitus   . GERD (gastroesophageal reflux disease)   . Heart murmur   . Hypertension   . Pneumonia   . Renal insufficiency   . Sickle cell anemia ([facility])    Hgb SS disease   . Wears glasses    Past Surgical History:  Past Surgical History:  Procedure Laterality Date  . AV FISTULA PLACEMENT Left 11/22/2016   Procedure: INSERTION OF ARTERIOVENOUS (AV) GORE-TEX GRAFT  LEFT FOREARM USING 6MM X 50CM GORETEX GRAFT;  Surgeon: Serafina Mitchell, MD;  Location: [facility];  Service: Vascular;  Laterality: Left;  . CATARACT EXTRACTION W/ INTRAOCULAR LENS  IMPLANT, BILATERAL    . CHOLECYSTECTOMY    . COLON SURGERY     Colon cancer surgery, removed small amt not full colectomy  . COLONOSCOPY     nclear who follows him for this  . LEG SURGERY     For ? gangrene after running into barbwire fence at 67yo   HPI:  67 year old male admitted 02/08/18 with abdominal and leg pain. PMH: sickle cell disease, DM, CKD4, recent vocal cord lesion with dysphonia, polysubstance abuse, GERD, HTN, colon cancer, PNA. CXR = cardiomegaly and moderate pulmonary vascular congestion without frank edema, no focal airspace disease.   Assessment / Plan / Recommendation Clinical Impression  Pt seen at bedside for assessment of swallow function and safety, given report of difficulty by RN and PT. Pt reports tolerance of finely chopped soft foods and thin liquids prior to admit, due largely to poor dentition. Pt was positioned upright in bed, and he complained of discomfort. ST educated pt regarding the importance of upright positioning during and after  meals, given history of GERD and report of globus sensation.   Pt accepted trials of thin liquid, puree, and soft solid. No oral difficulties were apparent, and pt exhibited no overt s/s aspiration. Recommend downgrading diet to dys 2 (finely chopped) and thin liquids with adherence to reflux precautions. These were provided in written form and reviewed with pt and daughter.   Consideration of esophageal work up is also recommended, given suspicion of esophageal dysmotility. Pt will need frequent reminders regarding positioning, as evidenced by the fact that pt had reclined to 30 degrees after ST had just encouraged him to stay at 45 degree after eating. MD and RN informed of results and recommendations. If not DC'd today, ST will follow briefly for education.    SLP Visit Diagnosis: Dysphagia, unspecified (R13.10)    Aspiration Risk  Mild aspiration risk    Diet Recommendation Dysphagia 2 (Fine chop);Thin liquid   Liquid Administration via: Cup;Straw Medication Administration: Whole meds with liquid Supervision: Patient able to self feed Compensations: Minimize environmental distractions;Small sips/bites;Slow rate Postural Changes: Seated upright at 90 degrees;Remain upright for at least 30 minutes after po intake    Other  Recommendations Recommended Consults: Consider esophageal assessment Oral Care Recommendations: Oral care BID   Follow up Recommendations None      Frequency and Duration min 1 x/week  1 week       Prognosis Prognosis for Safe Diet Advancement: Good  Swallow Study   General Date of Onset: 02/08/18 HPI: 67 year old male admitted 02/08/18 with abdominal and leg pain. PMH: sickle cell disease, DM, CKD4, recent vocal cord lesion with dysphonia, polysubstance abuse, GERD, HTN, colon cancer, PNA. CXR = cardiomegaly and moderate pulmonary vascular congestion without frank edema, no focal airspace disease. Type of Study: Bedside Swallow Evaluation Previous  Swallow Assessment: none Diet Prior to this Study: Regular;Thin liquids Temperature Spikes Noted: No Respiratory Status: Room air History of Recent Intubation: No Behavior/Cognition: Alert;Cooperative;Pleasant mood Oral Cavity Assessment: Within Functional Limits Oral Care Completed by SLP: No Oral Cavity - Dentition: Missing dentition Vision: Functional for self-feeding Self-Feeding Abilities: Able to feed self Patient Positioning: Upright in bed Baseline Vocal Quality: Normal Volitional Cough: Strong Volitional Swallow: Able to elicit    Oral/Motor/Sensory Function Overall Oral Motor/Sensory Function: Within functional limits   Ice Chips Ice chips: Not tested   Thin Liquid Thin Liquid: Within functional limits Presentation: Straw;Cup    Nectar Thick Nectar Thick Liquid: Not tested   Honey Thick Honey Thick Liquid: Not tested   Puree Puree: Within functional limits Presentation: Spoon;Self Fed   Solid     Solid: Within functional limits Presentation: [facility], [place] Speech Language Pathologist [phone]  Shonna Chock 02/12/2018,4:35 PM

## 2018-02-14 LAB — CULTURE, BLOOD (ROUTINE X 2)
CULTURE: NO GROWTH
Culture: NO GROWTH
SPECIAL REQUESTS: ADEQUATE
SPECIAL REQUESTS: ADEQUATE

## 2018-02-21 ENCOUNTER — Other Ambulatory Visit: Payer: Self-pay | Admitting: Otolaryngology

## 2018-03-08 ENCOUNTER — Encounter (HOSPITAL_COMMUNITY): Payer: Self-pay | Admitting: *Deleted

## 2018-03-08 ENCOUNTER — Ambulatory Visit (HOSPITAL_COMMUNITY): Payer: Medicare HMO | Admitting: Vascular Surgery

## 2018-03-08 ENCOUNTER — Other Ambulatory Visit: Payer: Self-pay

## 2018-03-08 NOTE — Progress Notes (Signed)
Anesthesia Follow-up: SAME DAY WORK-UP.  Case:  [id number] Date/Time:  03/11/18 0945   Procedure:  MICROLARYNGOSCOPY WITH CO2 LASER SMALL LASER SAFE ENOTRACHEAL TUBE AND EXCISION OF VOCAL CORD LESION (Left )   Anesthesia type:  General   Pre-op diagnosis:  vocal cord lesion   Location:  [facility] OR ROOM 08 / [place] [facility]   Surgeon:  Melida Quitter, MD      DISCUSSION: Patient is a 67 year old male with Sickle cell anemia (Hgb SS disease) scheduled for the above procedure. He was recently evaluated for dysphonia. Fiberoptic exam on 01/02/18 showed "a pedunculated lesion on the left anterior vocal cord. The lesion may be neoplastic." The above procedure recommended, and was initially scheduled for 02/11/18, but patient was admitted for acute on chronic renal failure, SIRS, cocaine use (see below).    History includes former smoker (quit '68), Sickle cell anemia (Hgb SS disease), DM2, CKDV (s/p left FA AVGG 11/22/16), HTN, left BBB, murmur (mild MR/TR), colon cancer (s/p colon surgery), cocaine use (+ UDS 02/08/18) Discharge summary from 03/2017 also notes chronic leukocytosis with baseline WBCs elevated ~ 15-17K--peak of 32K felt due to hemo-concentration related to dehydration.  - Admission 02/08/18-02/12/18 for sepsis/SIRS of undetermined source, acute on chronic renal failure, and cocaine use (02/07/18). He was evaluated for possible Sickle cell crisis by Cammie Sickle, NP/Dr. Doreene Burke. Generalized arthralgias and chest pain felt to be more chronic in nature. Troponin negative. He was hydrated and transfused with 1 Unit PRBC for Hgb 5.9. No definite source of infection found, but he did receive empiric cefepime, vancomycin, metronidazole. Out-patient follow-up with GI recommended to consider follow-up colonoscopy based on abdominal CT findings of enlarged lymph nodes and cecal thickening. - Admission 11/13/17-11/16/17 for opiate overdose (felt unintentional, most likely due to advanced renal disease). Treated with Narcan. Given  kayexalate for hyperkalemia.  - Admission 03/26/17-04/02/17 for acute on chronic kidney disease, acute urinary retention with urinary obstruction (foley placed, Flomax started, out-patient urology f/u), Sickle cell crisis, leukocytosis.  Patient with complex medical history as outlined above including CKD stage V, Sickle cell disease, DM2, and cocaine abuse. Reviewed chart with anesthesioloigst Roberts Gaudy, MD. Patient is a same day work-up, so further evaluation on the day of surgery. He will get routine labs, but defer decision for UDS is anesthesiologist.     PROVIDERS: -PCP is Dr. Nolene Ebbs. - Nephrologist is Dr. Donato Heinz. Last office note 02/07/18. He was aware of plans for ENT surgery at that time. He has not seen patient since 02/08/18 hospitalization.  -He last saw a cardiologist (Dr. Debara Pickett) during 03/2016 admission for chest pain. Seen stress/echo reports below.   LABS: He will need updated preoperative labs.  Lab Results  Component Value Date   CREATININE 4.37 (H) 02/11/2018   CREATININE 4.41 (H) 02/10/2018   CREATININE 4.93 (H) 02/09/2018  Creatinine primarily in the 4 range over the past year.   CBC Latest Ref Rng & Units 02/12/2018 02/11/2018 02/10/2018  WBC 4.0 - 10.5 K/uL 17.5(H) 18.8(H) -  Hemoglobin 13.0 - 17.0 g/dL 7.6(L) 7.0(L) 8.7(L)  Hematocrit 39.0 - 52.0 % 24.0(L) 21.3(L) 26.8(L)  Platelets 150 - 400 K/uL 252 270 -  Known chronic leukocytosis with baseline WBCs elevated ~ 15-17K-   IMAGES: CT abd/pelvis 02/08/18: IMPRESSION: - Interval development of pathologically enlarged lymph nodes in the right retroperitoneum along the right iliac chain and posterior to the cecum. - Questionable mucosal thickening versus adherent stool to the walls of the cecum. - Marked  dilation of the urinary bladder. Please correlate clinically. - Marked heterogeneous mottled sclerotic appearance of the osseous structures. This may be due to sickle cell disease or  represents diffuse metastatic disease.  1V PCXR 02/08/18: IMPRESSION: 1. Cardiomegaly and moderate pulmonary vascular congestion without frank edema. 2. No focal airspace disease. 3. Sclerotic changes at the humeral heads compatible with sickle cell disease.   EKG: 02/08/18: SR, left BBB. No significant change since last tracing. (New left BBB diagnosed4/2010 in the setting of chest pain and recent cocaine use.He was seen by cardiologist Dr. Phill Mutter. Troponin negative. 09/10/08 echo showed no wall motion abnormalities. Chest pain was felt due to recent cocaine use.)   CV: Echo 03/27/17: Study Conclusions - Left ventricle: The cavity size was normal. There was moderate concentric hypertrophy. Systolic function was normal. The estimated ejection fraction was in the range of 55% to 60%. Wall motion was normal; there were no regional wall motion abnormalities. There was an increased relative contribution of atrial contraction to ventricular filling. Doppler parameters are consistent with abnormal left ventricular relaxation (grade 1 diastolic dysfunction). Doppler parameters are consistent with high ventricular filling pressure. - Aortic valve: Valve area (Vmax): 2.69 cm^2. - Mitral valve: Calcified annulus. There was mild regurgitation. - Right ventricle: The cavity size was moderately dilated. Wall thickness was normal. - Right atrium: The atrium was mildly dilated. - Pulmonary artery: Systolic pressure could not be accurately measured. (Comparison echo 11/10/16: EF 60-65%, normal wall motion, no regional wall motion abnormalities, grade 2 diastolic dysfunction, mild TR, PA peak pressure: 38 mm Hg.)  Nuclear stress test 03/26/16:IMPRESSION: 1. Large fixed defect of the septum and inferior walls compatible with remote infarcts/scar. No significant inducible ischemia with pharmacologic stress. 2. Inferior hypokinesis and septal dyskinesis. 3. Left ventricular  ejection fraction 55% 4. Non invasive risk stratification*: High (Test done in the setting of chest pain with recent cocaine use. Troponin negative X 3. Dr. Debara Pickett wrote, "Pt. Seen and examined. Agree with the NP/PA-C note as written. Large fixed inferior wall defect with normal LVEF and inferior hypokinesis, may be scar or artifact. This is NOT a high-risk study, would be intermediate risk. Plan to treat medically at this time and continue to work on lifestyle modification. BP remains elevated - increase hydralazine. Echo pending, however, LVEF appears normal. H/H very low yesterday at 6/18 - may need transfusion (suspect result of SS anemia) - STAT H/H pending. Defer to medicine on this.")   MEDICATIONS: No current facility-administered medications for this encounter.    Marland Kitchen acetaminophen (TYLENOL) 500 MG tablet  . amLODipine (NORVASC) 5 MG tablet  . atorvastatin (LIPITOR) 20 MG tablet  . carvedilol (COREG) 25 MG tablet  . Docusate Calcium (STOOL SOFTENER PO)  . folic acid (FOLVITE) 1 MG tablet  . furosemide (LASIX) 80 MG tablet  . gabapentin (NEURONTIN) 300 MG capsule  . insulin aspart (NOVOLOG FLEXPEN) 100 UNIT/ML FlexPen  . omeprazole (PRILOSEC) 20 MG capsule  . polyethylene glycol powder (MIRALAX) powder  . Sennosides (SENNA LAXATIVE PO)  . tamsulosin (FLOMAX) 0.4 MG CAPS capsule  . tiZANidine (ZANAFLEX) 4 MG tablet  . VENTOLIN HFA 108 (90 Base) MCG/ACT inhaler    [facility] Short Stay Center/Anesthesiology Phone [phone] 03/08/2018 2:55 PM

## 2018-03-08 NOTE — Progress Notes (Signed)
Pt denies SOB, chest pain, and being under the care of a cardiologist. Pt denies having a cardiac cath. Pt made aware to stop taking vitamins, fish oil and herbal medications. Do not take any NSAIDs ie: Ibuprofen, Advil, Naproxen (Aleve), Motrin, BC and Goody Powder or any medication containing Aspirin.  Pt made aware to check BG every 2 hours prior to arrival to hospital on DOS. Pt made aware  take 1/2 correction dose of Novolog for BG >220.  Pt made aware to treat a BG < 70 with 4 ounces of apple  Juice, wait 15 minutes after intervention to recheck BG, if BG remains < 70, call Short Stay unit to speak with a nurse. Pt verbalized understanding of all pre-op instructions. See anesthesia note.

## 2018-03-10 NOTE — Anesthesia Preprocedure Evaluation (Addendum)
Anesthesia Evaluation  Patient identified by MRN, date of birth, ID band Patient awake    Reviewed: Allergy & Precautions, NPO status , Patient's Chart, lab work & pertinent test results  History of Anesthesia Complications Negative for: history of anesthetic complications  Airway Mallampati: II  TM Distance: >3 FB Neck ROM: Full    Dental no notable dental hx. (+) Edentulous Upper, Poor Dentition, Dental Advisory Given, Missing Missing lower molars:   Pulmonary former smoker,    Pulmonary exam normal breath sounds clear to auscultation       Cardiovascular hypertension, Pt. on medications and Pt. on home beta blockers Normal cardiovascular exam Rhythm:Regular Rate:Normal  LBBB dx in 2010, TTE 03/2017: EF 18-29%, gr 1 diastolic dysfunction, mild MR   Neuro/Psych negative neurological ROS  negative psych ROS   GI/Hepatic GERD  Medicated and Controlled,(+)     substance abuse (Last cocaine use 1 month ago per pt)  cocaine use,   Endo/Other  diabetes, Type 2  Renal/GU CRFRenal disease  negative genitourinary   Musculoskeletal  (+) Arthritis , Osteoarthritis,    Abdominal   Peds  Hematology  (+) Sickle cell anemia ,   Anesthesia Other Findings   Reproductive/Obstetrics negative OB ROS                            Anesthesia Physical Anesthesia Plan  ASA: III  Anesthesia Plan: General   Post-op Pain Management:    Induction: Intravenous  PONV Risk Score and Plan: 2 and Ondansetron, Dexamethasone and Treatment may vary due to age or medical condition  Airway Management Planned: Oral ETT  Additional Equipment:   Intra-op Plan:   Post-operative Plan: Extubation in OR  Informed Consent: I have reviewed the patients History and Physical, chart, labs and discussed the procedure including the risks, benefits and alternatives for the proposed anesthesia with the patient or authorized  representative who has indicated his/her understanding and acceptance.   Dental advisory given  Plan Discussed with:   Anesthesia Plan Comments: (Istat K was 6.4. Lab repeat remains with K 6.4. Case cancelled. Discussed with patient, surgeon, and patient's family.)      Anesthesia Quick Evaluation

## 2018-03-11 ENCOUNTER — Ambulatory Visit (HOSPITAL_COMMUNITY)
Admission: RE | Admit: 2018-03-11 | Discharge: 2018-03-11 | Disposition: A | Discharge status: Home / Self Care | Payer: Medicare HMO | Source: Ambulatory Visit | Attending: Otolaryngology | Admitting: Otolaryngology

## 2018-03-11 ENCOUNTER — Other Ambulatory Visit: Payer: Self-pay

## 2018-03-11 ENCOUNTER — Encounter (HOSPITAL_COMMUNITY): Payer: Self-pay

## 2018-03-11 ENCOUNTER — Encounter (HOSPITAL_COMMUNITY)
Admission: RE | Disposition: A | Discharge status: Home / Self Care | Payer: Self-pay | Source: Ambulatory Visit | Attending: Otolaryngology

## 2018-03-11 DIAGNOSIS — J383 Other diseases of vocal cords: Secondary | ICD-10-CM | POA: Insufficient documentation

## 2018-03-11 DIAGNOSIS — Z79899 Other long term (current) drug therapy: Secondary | ICD-10-CM | POA: Insufficient documentation

## 2018-03-11 DIAGNOSIS — K219 Gastro-esophageal reflux disease without esophagitis: Secondary | ICD-10-CM | POA: Insufficient documentation

## 2018-03-11 DIAGNOSIS — Z9049 Acquired absence of other specified parts of digestive tract: Secondary | ICD-10-CM | POA: Insufficient documentation

## 2018-03-11 DIAGNOSIS — R490 Dysphonia: Secondary | ICD-10-CM | POA: Insufficient documentation

## 2018-03-11 DIAGNOSIS — Z794 Long term (current) use of insulin: Secondary | ICD-10-CM | POA: Diagnosis not present

## 2018-03-11 DIAGNOSIS — Z85038 Personal history of other malignant neoplasm of large intestine: Secondary | ICD-10-CM | POA: Diagnosis not present

## 2018-03-11 DIAGNOSIS — N185 Chronic kidney disease, stage 5: Secondary | ICD-10-CM | POA: Diagnosis not present

## 2018-03-11 DIAGNOSIS — I120 Hypertensive chronic kidney disease with stage 5 chronic kidney disease or end stage renal disease: Secondary | ICD-10-CM | POA: Insufficient documentation

## 2018-03-11 DIAGNOSIS — E1122 Type 2 diabetes mellitus with diabetic chronic kidney disease: Secondary | ICD-10-CM | POA: Diagnosis not present

## 2018-03-11 DIAGNOSIS — Z87891 Personal history of nicotine dependence: Secondary | ICD-10-CM | POA: Insufficient documentation

## 2018-03-11 DIAGNOSIS — Z5309 Procedure and treatment not carried out because of other contraindication: Secondary | ICD-10-CM | POA: Insufficient documentation

## 2018-03-11 HISTORY — DX: Other diseases of vocal cords: J38.3

## 2018-03-11 LAB — POCT I-STAT 4, (NA,K, GLUC, HGB,HCT)
GLUCOSE: 142 mg/dL — AB (ref 70–99)
Glucose, Bld: 130 mg/dL — ABNORMAL HIGH (ref 70–99)
HCT: 27 % — ABNORMAL LOW (ref 39.0–52.0)
HEMATOCRIT: 23 % — AB (ref 39.0–52.0)
HEMOGLOBIN: 7.8 g/dL — AB (ref 13.0–17.0)
Hemoglobin: 9.2 g/dL — ABNORMAL LOW (ref 13.0–17.0)
POTASSIUM: 6.4 mmol/L — AB (ref 3.5–5.1)
Potassium: 6.4 mmol/L (ref 3.5–5.1)
SODIUM: 141 mmol/L (ref 135–145)
SODIUM: 141 mmol/L (ref 135–145)

## 2018-03-11 LAB — GLUCOSE, CAPILLARY: Glucose-Capillary: 134 mg/dL — ABNORMAL HIGH (ref 70–99)

## 2018-03-11 SURGERY — MICROLARYNGOSCOPY WITH CO2 LASER AND EXCISION OF VOCAL CORD LESION
Anesthesia: General | Site: Throat | Laterality: Left

## 2018-03-11 MED ORDER — PROPOFOL 10 MG/ML IV BOLUS
INTRAVENOUS | Status: AC
  Filled 2018-03-11: qty 20

## 2018-03-11 MED ORDER — EPINEPHRINE PF 1 MG/ML IJ SOLN: INTRAMUSCULAR | Status: DC | PRN

## 2018-03-11 MED ORDER — 0.9 % SODIUM CHLORIDE (POUR BTL) OPTIME: TOPICAL | Status: DC | PRN

## 2018-03-11 MED ORDER — FENTANYL CITRATE (PF) 250 MCG/5ML IJ SOLN
INTRAMUSCULAR | Status: AC
  Filled 2018-03-11: qty 5

## 2018-03-11 MED ORDER — MIDAZOLAM HCL 2 MG/2ML IJ SOLN
INTRAMUSCULAR | Status: AC
  Filled 2018-03-11: qty 2

## 2018-03-11 MED ORDER — EPINEPHRINE HCL (NASAL) 0.1 % NA SOLN
NASAL | Status: AC
  Filled 2018-03-11: qty 30

## 2018-03-11 NOTE — H&P (Signed)
[name] is an 67 y.o. male.   Chief Complaint: Vocal fold lesion HPI: 66 year old male with several months of hoarseness who was found to have a lesion on the left vocal fold.  He presents for surgical management.  Past Medical History:  Diagnosis Date  . Arthritis   . Cancer ([place])    colon  . Diabetes mellitus   . GERD (gastroesophageal reflux disease)   . Heart murmur   . Hypertension   . Lesion of vocal cord   . Pneumonia   . Renal insufficiency   . Sickle cell anemia (HCC)    Hgb SS disease   . Wears glasses     Past Surgical History:  Procedure Laterality Date  . AV FISTULA PLACEMENT Left 11/22/2016   Procedure: INSERTION OF ARTERIOVENOUS (AV) GORE-TEX GRAFT  LEFT FOREARM USING 6MM X 50CM GORETEX GRAFT;  Surgeon: Serafina Mitchell, MD;  Location: [facility];  Service: Vascular;  Laterality: Left;  . CATARACT EXTRACTION W/ INTRAOCULAR LENS  IMPLANT, BILATERAL    . CHOLECYSTECTOMY    . COLON SURGERY     Colon cancer surgery, removed small amt not full colectomy  . COLONOSCOPY     nclear who follows him for this  . LEG SURGERY     For ? gangrene after running into barbwire fence at 67yo    Family History  Problem Relation Age of Onset  . Venous thrombosis Mother        Dec. of blood clot   . Diabetes type II Mother   . AAA (abdominal aortic aneurysm) Mother   . Throat cancer Father        Deceased of throat cancer   Social History:  reports that he quit smoking about 51 years ago. He has a 10.00 pack-year smoking history. He has never used smokeless tobacco. He reports that he drinks alcohol. He reports that he has current or past drug history. Drug: Cocaine.  Allergies:  Allergies  Allergen Reactions  . Morphine And Related Itching    Medications Prior to Admission  Medication Sig Dispense Refill  . acetaminophen (TYLENOL) 500 MG tablet Take 500 mg by mouth every 6 (six) hours as needed for headache (pain).    Marland Kitchen amLODipine (NORVASC) 5 MG tablet Take 5 mg by  mouth daily.  5  . atorvastatin (LIPITOR) 20 MG tablet Take 20 mg by mouth every evening.    . carvedilol (COREG) 25 MG tablet Take 25 mg by mouth 2 (two) times daily.  2  . Docusate Calcium (STOOL SOFTENER PO) Take 1 capsule by mouth daily as needed (constipation).    . folic acid (FOLVITE) 1 MG tablet Take 1 tablet (1 mg total) by mouth daily. 30 tablet 0  . furosemide (LASIX) 80 MG tablet Take 0.5 tablets (40 mg total) by mouth daily as needed for edema (Weight more than 2 pounds above 177 pounds). 30 tablet 0  . gabapentin (NEURONTIN) 300 MG capsule Take 300 mg by mouth at bedtime.   1  . insulin aspart (NOVOLOG FLEXPEN) 100 UNIT/ML FlexPen Inject 2-6 Units into the skin 3 (three) times daily as needed for high blood sugar (CBG >150).    Marland Kitchen omeprazole (PRILOSEC) 20 MG capsule Take 20 mg by mouth daily.   5  . polyethylene glycol powder (MIRALAX) powder Take 17 g by mouth daily. (Patient taking differently: Take 17 g by mouth daily as needed (constipation). Mix in 8 oz liquid and drink) 255 g  0  . Sennosides (SENNA LAXATIVE PO) Take 1 tablet by mouth daily as needed (constipation).    . tamsulosin (FLOMAX) 0.4 MG CAPS capsule Take 1 capsule (0.4 mg total) by mouth daily. 30 capsule 0  . tiZANidine (ZANAFLEX) 4 MG tablet Take 4 mg by mouth 2 (two) times daily as needed for muscle spasms.   2  . VENTOLIN HFA 108 (90 Base) MCG/ACT inhaler Inhale 2 puffs into the lungs every 6 (six) hours as needed for wheezing.   2    Results for orders placed or performed during the hospital encounter of 03/11/18 (from the past 48 hour(s))  Glucose, capillary     Status: Abnormal   Collection Time: 03/11/18  8:00 AM  Result Value Ref Range   Glucose-Capillary 134 (H) 70 - 99 mg/dL   No results found.  Review of Systems  All other systems reviewed and are negative.   Blood pressure (!) 154/78, pulse (!) 58, temperature 98.2 F (36.8 C), resp. rate 17, height 6\' 1"  (1.854 m), weight 76.2 kg, SpO2 100  %. Physical Exam  Constitutional: He is oriented to person, place, and time. He appears well-developed and well-nourished. No distress.  HENT:  Head: Normocephalic and atraumatic.  Right Ear: External ear normal.  Left Ear: External ear normal.  Nose: Nose normal.  Mouth/Throat: Oropharynx is clear and moist.  Slight dysphonia.  Eyes: Pupils are equal, round, and reactive to light. Conjunctivae and EOM are normal.  Neck: Normal range of motion. Neck supple.  Cardiovascular: Normal rate.  Respiratory: Effort normal.  Musculoskeletal: Normal range of motion.  Neurological: He is alert and oriented to person, place, and time. No cranial nerve deficit.  Skin: Skin is warm and dry.  Psychiatric: He has a normal mood and affect. His behavior is normal. Judgment and thought content normal.     Assessment/Plan Dysphonia, left vocal fold lesion To OR for SMDL with CO2 laser excision of left vocal fold lesion.  Melida Quitter, MD 03/11/2018, 9:11 AM

## 2018-03-11 NOTE — Progress Notes (Signed)
Pt's Lab work showed a K level of 6.4. Dr Redmond Baseman made aware and surgery canceled. Pt informed he needed to see his kidney doctor and discharged.

## 2018-03-12 LAB — HEMOGLOBIN A1C
HEMOGLOBIN A1C: 5.6 % (ref 4.8–5.6)
Mean Plasma Glucose: 114 mg/dL

## 2018-04-19 ENCOUNTER — Other Ambulatory Visit: Payer: Self-pay

## 2018-04-19 DIAGNOSIS — N185 Chronic kidney disease, stage 5: Secondary | ICD-10-CM

## 2018-05-10 ENCOUNTER — Telehealth (HOSPITAL_COMMUNITY): Payer: Self-pay | Admitting: Surgery

## 2018-05-10 NOTE — Telephone Encounter (Signed)
Attempted to contact patient to confirm appointments for Monday 05/13/2018 [facility]

## 2018-05-13 ENCOUNTER — Other Ambulatory Visit (HOSPITAL_COMMUNITY): Payer: Medicare HMO

## 2018-05-13 ENCOUNTER — Ambulatory Visit: Payer: Medicare HMO

## 2018-05-13 ENCOUNTER — Encounter (HOSPITAL_COMMUNITY): Payer: Medicare HMO

## 2018-06-25 NOTE — Progress Notes (Addendum)
HISTORY AND PHYSICAL     CC:  dialysis access Requesting Provider:  Nolene Ebbs, MD  HPI: This is a 68 y.o. male here for evaluation of his hemodialysis access.  He has CKD 5.  He underwent a left forearm AVGG by Dr. Trula Slade in June 2018.  He presents today for evaluation for new access ans his left forearm loop graft is thrombosed.  He tells me that it has not been working since October or November.   He states that he is getting closer to dialysis.  He states he was scheduled for a bx of a nodule on his voice box, but his potassium was too high.  This is still trying to be coordinated.    He has hx of left sided port a cath in the past.  This has been removed.  He states he has had ~30-35lb weight loss in the past year.  He has hx of sickle cell.  He is diabetic and is on insulin.  The pt is on a statin for cholesterol management.  He is on a BB and CCB for blood pressure mangement.    The pt is right hand dominant.   Pt is not on dialysis.    If pt on Dialysis:   Dialysis days/center:  n/a  Past Medical History:  Diagnosis Date  . Arthritis   . Cancer ([place])    colon  . Diabetes mellitus   . GERD (gastroesophageal reflux disease)   . Heart murmur   . Hypertension   . Lesion of vocal cord   . Pneumonia   . Renal insufficiency   . Sickle cell anemia (HCC)    Hgb SS disease   . Wears glasses     Past Surgical History:  Procedure Laterality Date  . AV FISTULA PLACEMENT Left 11/22/2016   Procedure: INSERTION OF ARTERIOVENOUS (AV) GORE-TEX GRAFT  LEFT FOREARM USING 6MM X 50CM GORETEX GRAFT;  Surgeon: Serafina Mitchell, MD;  Location: [facility];  Service: Vascular;  Laterality: Left;  . CATARACT EXTRACTION W/ INTRAOCULAR LENS  IMPLANT, BILATERAL    . CHOLECYSTECTOMY    . COLON SURGERY     Colon cancer surgery, removed small amt not full colectomy  . COLONOSCOPY     nclear who follows him for this  . LEG SURGERY     For ? gangrene after running into barbwire fence at 68yo     Allergies  Allergen Reactions  . Morphine And Related Itching    Current Outpatient Medications  Medication Sig Dispense Refill  . acetaminophen (TYLENOL) 500 MG tablet Take 500 mg by mouth every 6 (six) hours as needed for headache (pain).    Marland Kitchen amLODipine (NORVASC) 5 MG tablet Take 5 mg by mouth daily.  5  . atorvastatin (LIPITOR) 20 MG tablet Take 20 mg by mouth every evening.    . carvedilol (COREG) 25 MG tablet Take 25 mg by mouth 2 (two) times daily.  2  . Docusate Calcium (STOOL SOFTENER PO) Take 1 capsule by mouth daily as needed (constipation).    . folic acid (FOLVITE) 1 MG tablet Take 1 tablet (1 mg total) by mouth daily. 30 tablet 0  . furosemide (LASIX) 80 MG tablet Take 0.5 tablets (40 mg total) by mouth daily as needed for edema (Weight more than 2 pounds above 177 pounds). 30 tablet 0  . gabapentin (NEURONTIN) 300 MG capsule Take 300 mg by mouth at bedtime.   1  . insulin aspart (NOVOLOG FLEXPEN)  100 UNIT/ML FlexPen Inject 2-6 Units into the skin 3 (three) times daily as needed for high blood sugar (CBG >150).    Marland Kitchen omeprazole (PRILOSEC) 20 MG capsule Take 20 mg by mouth daily.   5  . polyethylene glycol powder (MIRALAX) powder Take 17 g by mouth daily. (Patient taking differently: Take 17 g by mouth daily as needed (constipation). Mix in 8 oz liquid and drink) 255 g 0  . Sennosides (SENNA LAXATIVE PO) Take 1 tablet by mouth daily as needed (constipation).    . tamsulosin (FLOMAX) 0.4 MG CAPS capsule Take 1 capsule (0.4 mg total) by mouth daily. 30 capsule 0  . tiZANidine (ZANAFLEX) 4 MG tablet Take 4 mg by mouth 2 (two) times daily as needed for muscle spasms.   2  . VENTOLIN HFA 108 (90 Base) MCG/ACT inhaler Inhale 2 puffs into the lungs every 6 (six) hours as needed for wheezing.   2   No current facility-administered medications for this visit.     Family History  Problem Relation Age of Onset  . Venous thrombosis Mother        Dec. of blood clot   . Diabetes  type II Mother   . AAA (abdominal aortic aneurysm) Mother   . Throat cancer Father        Deceased of throat cancer    Social History   Socioeconomic History  . Marital status: Divorced    Spouse name: Not on file  . Number of children: Not on file  . Years of education: Not on file  . Highest education level: Not on file  Occupational History  . Not on file  Social Needs  . Financial resource strain: Not on file  . Food insecurity:    Worry: Not on file    Inability: Not on file  . Transportation needs:    Medical: Not on file    Non-medical: Not on file  Tobacco Use  . Smoking status: Former Smoker    Packs/day: 0.50    Years: 20.00    Pack years: 10.00    Last attempt to quit: 06/05/1966    Years since quitting: 52.0  . Smokeless tobacco: Never Used  Substance and Sexual Activity  . Alcohol use: Yes    Comment: rare beer  . Drug use: Not Currently    Types: Cocaine    Comment: pt was clean for 2 years and started back recently.   Marland Kitchen Sexual activity: Not on file  Lifestyle  . Physical activity:    Days per week: Not on file    Minutes per session: Not on file  . Stress: Not on file  Relationships  . Social connections:    Talks on phone: Not on file    Gets together: Not on file    Attends religious service: Not on file    Active member of club or organization: Not on file    Attends meetings of clubs or organizations: Not on file    Relationship status: Not on file  . Intimate partner violence:    Fear of current or ex partner: Not on file    Emotionally abused: Not on file    Physically abused: Not on file    Forced sexual activity: Not on file  Other Topics Concern  . Not on file  Social History Narrative   Retired Administrator, on disability. Former smoker. Living by himself, able to get around [facility].    Has a wife who  checks on him but they don't live together sicne 2007   Quit drinking      ROS: [x]  Positive   [ ]  Negative   [ ]  All sytems reviewed  and are negative  Cardiac: []  chest pain/pressure []  SOB []  DOE  Vascular: []  pain in legs while walking []  pain in feet when lying flat []  hx of DVT []  swelling in legs  Pulmonary: []  asthma []  wheezing  Neurologic: []  weakness in []  arms []  legs []  numbness in []  arms []  legs [] difficulty speaking or slurred speech []  temporary loss of vision in one eye []  dizziness  Hematologic: [x]  sickle cell  GI []  GERD  GU: [x]  CKD/renal failure  []  HD---[]  M/W/F []  T/T/F   Psychiatric: []  hx of major depression  Integumentary: []  rashes []  ulcers  Constitutional: []  fever []  chills  PHYSICAL EXAMINATION:  Today's Vitals   06/26/18 1445  BP: 136/75  Pulse: 64  Resp: 20  Temp: 97.7 F (36.5 C)  SpO2: 98%  Weight: 168 lb (76.2 kg)  Height: 6\' 1"  (1.854 m)   Body mass index is 22.16 kg/m.    General:  WDWN male in NAD Gait: Not observed HENT: WNL Pulmonary: normal non-labored breathing , without Rales, rhonchi,  wheezing Cardiac: regular, with  Murmur without carotid bruits Abdomen: soft, NT, no masses Skin: incision left arm healed Vascular Exam/Pulses:   Right Left  Radial 2+ (normal) 2+ (normal)  Ulnar Unable to palpate  Unable to palpate    Extremities:  without ischemic changes, without Gangrene, without cellulitis; without open wounds;  Musculoskeletal: no muscle wasting or atrophy  Neurologic: A&O X 3; Moving all extremities equally;  Speech is fluent/normal  Non-Invasive Vascular Imaging:   Upper Extremity Vein Mapping on 5/36/6440: Left Cephalic    Diameter (cm)Depth (cm)   Findings    +-----------------+-------------+----------+--------------+ Shoulder                                not visualized +-----------------+-------------+----------+--------------+ Prox upper arm                          not visualized +-----------------+-------------+----------+--------------+ Mid upper arm                           not  visualized +-----------------+-------------+----------+--------------+ Dist upper arm                          not visualized +-----------------+-------------+----------+--------------+ Antecubital fossa                       not visualized +-----------------+-------------+----------+--------------+  +-----------------+-------------+----------+--------+ Left Basilic     Diameter (cm)Depth (cm)Findings +-----------------+-------------+----------+--------+ Mid upper arm        0.37                        +-----------------+-------------+----------+--------+ Dist upper arm       0.30                        +-----------------+-------------+----------+--------+ Antecubital fossa    0.17                        +-----------------+-------------+----------+--------+    ASSESSMENT/PLAN: 68 y.o. male with CKD V here for  evaluation of his hemodialysis access.  He underwent a left forearm AVGG by Dr. Trula Slade in June 2018.  -pt is right handed.  -given his left FA loop graft is thrombosed and he may need dialysis in the near future, will plan for new RUA AVG.   Discussed with Dr. Oneida Alar and left basilic vein is not adequate for fistula.  He has hx of left chest port a cath, but this has been removed.  -Discussed with the pt that he may need a tunneled dialysis catheter if he needs dialysis prior to the graft being ready, which will take 4 weeks from surgery before it is usable.  -pt is not on anticoagulation   Leontine Locket, PA-C [facility] [phone]  Clinic MD:   [facility]

## 2018-06-26 ENCOUNTER — Ambulatory Visit (INDEPENDENT_AMBULATORY_CARE_PROVIDER_SITE_OTHER)
Admission: RE | Admit: 2018-06-26 | Discharge: 2018-06-26 | Disposition: A | Discharge status: Home / Self Care | Payer: Medicare HMO | Source: Ambulatory Visit | Attending: Surgery | Admitting: Surgery

## 2018-06-26 ENCOUNTER — Other Ambulatory Visit: Payer: Self-pay | Admitting: *Deleted

## 2018-06-26 ENCOUNTER — Other Ambulatory Visit: Payer: Self-pay

## 2018-06-26 ENCOUNTER — Encounter: Payer: Self-pay | Admitting: *Deleted

## 2018-06-26 ENCOUNTER — Ambulatory Visit (INDEPENDENT_AMBULATORY_CARE_PROVIDER_SITE_OTHER): Payer: Medicare HMO | Admitting: Physician Assistant

## 2018-06-26 ENCOUNTER — Ambulatory Visit (HOSPITAL_COMMUNITY)
Admission: RE | Admit: 2018-06-26 | Discharge: 2018-06-26 | Disposition: A | Discharge status: Home / Self Care | Payer: Medicare HMO | Source: Ambulatory Visit | Attending: Surgery | Admitting: Surgery

## 2018-06-26 VITALS — BP 136/75 | HR 64 | Temp 97.7°F | Resp 20 | Ht 73.0 in | Wt 168.0 lb

## 2018-06-26 DIAGNOSIS — N185 Chronic kidney disease, stage 5: Secondary | ICD-10-CM

## 2018-06-27 ENCOUNTER — Telehealth: Payer: Self-pay | Admitting: *Deleted

## 2018-06-27 NOTE — Telephone Encounter (Signed)
Call to Joyce(significant other) to explain and review cardiology appt for clearance prior to surgery. Understands patient must make this appointment  In able to proceed with surgery.

## 2018-07-08 ENCOUNTER — Telehealth: Payer: Self-pay

## 2018-07-08 NOTE — Telephone Encounter (Signed)
Spoke to [place] to notify her of pt's time change for surgery. Reminded her of pt's cardiac clearance appt. Tomorrow. She verbalized understanding.

## 2018-07-08 NOTE — Telephone Encounter (Signed)
Spoke to [place] to notify her of surgery time change. She will make pt aware.

## 2018-07-09 ENCOUNTER — Ambulatory Visit (INDEPENDENT_AMBULATORY_CARE_PROVIDER_SITE_OTHER): Payer: Medicare HMO | Admitting: Internal Medicine

## 2018-07-09 ENCOUNTER — Telehealth: Payer: Self-pay | Admitting: *Deleted

## 2018-07-09 ENCOUNTER — Encounter: Payer: Self-pay | Admitting: Internal Medicine

## 2018-07-09 VITALS — BP 118/69 | HR 76 | Ht 73.0 in | Wt 163.8 lb

## 2018-07-09 DIAGNOSIS — I10 Essential (primary) hypertension: Secondary | ICD-10-CM | POA: Diagnosis not present

## 2018-07-09 DIAGNOSIS — E782 Mixed hyperlipidemia: Secondary | ICD-10-CM

## 2018-07-09 DIAGNOSIS — N185 Chronic kidney disease, stage 5: Secondary | ICD-10-CM | POA: Diagnosis not present

## 2018-07-09 DIAGNOSIS — Z01810 Encounter for preprocedural cardiovascular examination: Secondary | ICD-10-CM

## 2018-07-09 DIAGNOSIS — I447 Left bundle-branch block, unspecified: Secondary | ICD-10-CM

## 2018-07-09 NOTE — Telephone Encounter (Signed)
Request for Surgical Clearance  1. What type of surgery is being performed?  INSERTION OF A/V GRAFT LEFT ARM    2. When is this surgery scheduled? 07/19/2018  3. What type of clearance is required (medical clearance vs. Pharmacy clearance to hold med vs. Both)? CARDIAC ONLY    4. Are there any medications that need to be held prior to surgery and how long?  NO    5. Practice name and name of physician performing surgery?   [facility] DR. Trula Slade    6.  What is your office phone number? [phone]    7. What is your office fax number? (Be sure to include anyone who it needs to go Attn to) [place]    8. Anesthesia type (None, local, MAC, general)? MAC   REMINDER TO USER: Remember to please route this message to P CV DIV PREOP in a phone note.

## 2018-07-09 NOTE — Progress Notes (Signed)
OFFICE NOTE  Chief Complaint:  Preoperative risk assessment  Primary Care Physician: Nolene Ebbs, MD  HPI:  [name] is a 68 y.o. male with a past medial history significant for type 2 diabetes, hypertension, murmur, chronic kidney disease and sickle cell anemia, who presents for for preoperative risk evaluation prior to insertion of an AV fistula.  I last met [name] in 2017 while he was hospitalized with chest pain.  He underwent an echocardiogram and stress test at that time.  The stress test indicated normal LV function however there was a fixed defect likely related to an underlying left bundle branch block.  This bundle branch block is present persistently.  Subsequent echo showed normal heart function in 2018.  Since then he denies any worsening chest pain and stable shortness of breath.  Unfortunately a fistula he had placed 2 years ago is not working currently and will need another dialysis fistula placed.  He also has a history of polysubstance abuse which she says is in the past and he has resolved from that.  PMHx:  Past Medical History:  Diagnosis Date  . Arthritis   . Cancer ([place])    colon  . Diabetes mellitus   . GERD (gastroesophageal reflux disease)   . Heart murmur   . Hypertension   . Lesion of vocal cord   . Pneumonia   . Renal insufficiency   . Sickle cell anemia (HCC)    Hgb SS disease   . Wears glasses     Past Surgical History:  Procedure Laterality Date  . AV FISTULA PLACEMENT Left 11/22/2016   Procedure: INSERTION OF ARTERIOVENOUS (AV) GORE-TEX GRAFT  LEFT FOREARM USING 6MM X 50CM GORETEX GRAFT;  Surgeon: Serafina Mitchell, MD;  Location: [facility];  Service: Vascular;  Laterality: Left;  . CATARACT EXTRACTION W/ INTRAOCULAR LENS  IMPLANT, BILATERAL    . CHOLECYSTECTOMY    . COLON SURGERY     Colon cancer surgery, removed small amt not full colectomy  . COLONOSCOPY     nclear who follows him for this  . LEG SURGERY     For ? gangrene after  running into barbwire fence at 68yo    FAMHx:  Family History  Problem Relation Age of Onset  . Venous thrombosis Mother        Dec. of blood clot   . Diabetes type II Mother   . AAA (abdominal aortic aneurysm) Mother   . Throat cancer Father        Deceased of throat cancer    SOCHx:   reports that he quit smoking about 52 years ago. He has a 10.00 pack-year smoking history. He has never used smokeless tobacco. He reports current alcohol use. He reports previous drug use. Drug: Cocaine.  ALLERGIES:  Allergies  Allergen Reactions  . Morphine And Related Itching    ROS: Pertinent items noted in HPI and remainder of comprehensive ROS otherwise negative.  HOME MEDS: Current Outpatient Medications on File Prior to Visit  Medication Sig Dispense Refill  . acetaminophen (TYLENOL) 500 MG tablet Take 500 mg by mouth every 6 (six) hours as needed for headache (pain).    Marland Kitchen amLODipine (NORVASC) 5 MG tablet Take 5 mg by mouth daily.  5  . atorvastatin (LIPITOR) 20 MG tablet Take 20 mg by mouth every evening.    . calcitRIOL (ROCALTROL) 0.25 MCG capsule     . carvedilol (COREG) 25 MG tablet Take 25 mg by mouth 2 (  two) times daily.  2  . Docusate Calcium (STOOL SOFTENER PO) Take 1 capsule by mouth daily as needed (constipation).    . folic acid (FOLVITE) 1 MG tablet Take 1 tablet (1 mg total) by mouth daily. 30 tablet 0  . furosemide (LASIX) 80 MG tablet Take 0.5 tablets (40 mg total) by mouth daily as needed for edema (Weight more than 2 pounds above 177 pounds). 30 tablet 0  . gabapentin (NEURONTIN) 300 MG capsule Take 300 mg by mouth at bedtime.   1  . insulin aspart (NOVOLOG FLEXPEN) 100 UNIT/ML FlexPen Inject 2-6 Units into the skin 3 (three) times daily as needed for high blood sugar (CBG >150).    Marland Kitchen omeprazole (PRILOSEC) 20 MG capsule Take 20 mg by mouth daily.   5  . ONGLYZA 5 MG TABS tablet     . polyethylene glycol powder (MIRALAX) powder Take 17 g by mouth daily. (Patient taking  differently: Take 17 g by mouth daily as needed (constipation). Mix in 8 oz liquid and drink) 255 g 0  . Sennosides (SENNA LAXATIVE PO) Take 1 tablet by mouth daily as needed (constipation).    . VENTOLIN HFA 108 (90 Base) MCG/ACT inhaler Inhale 2 puffs into the lungs every 6 (six) hours as needed for wheezing.   2   No current facility-administered medications on file prior to visit.     LABS/IMAGING: No results found for this or any previous visit (from the past 48 hour(s)). No results found.  LIPID PANEL:    Component Value Date/Time   CHOL  09/11/2008 0514    126        ATP III CLASSIFICATION:  <200     mg/dL   Desirable  200-239  mg/dL   Borderline High  >=240    mg/dL   High          TRIG 71 09/11/2008 0514   HDL 44 09/11/2008 0514   CHOLHDL 2.9 09/11/2008 0514   VLDL 14 09/11/2008 0514   LDLCALC  09/11/2008 0514    68        Total Cholesterol/HDL:CHD Risk Coronary Heart Disease Risk Table                     Men   Women  1/2 Average Risk   3.4   3.3  Average Risk       5.0   4.4  2 X Average Risk   9.6   7.1  3 X Average Risk  23.4   11.0        Use the calculated Patient Ratio above and the CHD Risk Table to determine the patient's CHD Risk.        ATP III CLASSIFICATION (LDL):  <100     mg/dL   Optimal  100-129  mg/dL   Near or Above                    Optimal  130-159  mg/dL   Borderline  160-189  mg/dL   High  >190     mg/dL   Very High     WEIGHTS: Wt Readings from Last 3 Encounters:  07/09/18 163 lb 12.8 oz (74.3 kg)  06/26/18 168 lb (76.2 kg)  03/11/18 168 lb (76.2 kg)    VITALS: BP 118/69   Pulse 76   Ht _0  (1.854 m)   Wt 163 lb 12.8 oz (74.3 kg)   BMI 21.61 kg/m  EXAM: General appearance: alert and no distress Neck: no carotid bruit, no JVD and thyroid not enlarged, symmetric, no tenderness/mass/nodules Lungs: clear to auscultation bilaterally Heart: regular rate and rhythm, S1, S2 normal, no murmur, click, rub or gallop Abdomen:  soft, non-tender; bowel sounds normal; no masses,  no organomegaly Extremities: extremities normal, atraumatic, no cyanosis or edema Pulses: 2+ and symmetric Skin: Skin color, texture, turgor normal. No rashes or lesions Neurologic: Grossly normal Psych: Pleasant  EKG: Normal sinus rhythm 70, LBBB-- personally reviewed  ASSESSMENT: 1. Acceptable risk for AV fistula placement 2. Chronic LBBB 3. ESRD anticipating dialysis 4. Hypertension 5. Dyslipidemia 6. Diabetes  PLAN: 1.   [name] had a low risk stress test in 2017 which showed a fixed defect likely related to left bundle branch block.  No reversible ischemia was noted.  In 2018 he had normal systolic function on echo.  He has no new symptoms concerning for ischemia.  I think he is acceptable risk for AV fistula placement and should start dialysis as necessary.  Plan follow-up with me annually or sooner as necessary.  Pixie Casino, MD, [facility], [place] Director of the Advanced Lipid Disorders &  Cardiovascular Risk Reduction Clinic Diplomate of the American Board of Clinical Lipidology Attending Cardiologist  Direct Dial: [phone]  Fax: [phone]  Website:  [email or web address] 07/09/2018, 4:30 PM

## 2018-07-09 NOTE — Telephone Encounter (Signed)
   Primary Cardiologist:No primary care provider on file. - NEW  Chart reviewed as part of pre-operative protocol coverage. Because of [name]'s past medical history and time since last visit, he/she will require a follow-up visit in order to better assess preoperative cardiovascular risk.  Pre-op covering staff: - Please schedule appointment and call patient to inform them. - Please contact requesting surgeon's office via preferred method (i.e, phone, fax) to inform them of need for appointment prior to surgery.  If applicable, this message will also be routed to pharmacy pool and/or primary cardiologist for input on holding anticoagulant/antiplatelet agent as requested below so that this information is available at time of patient's appointment.   Preop pool - please make sure the patient doesn't see a cardiologist outside of [facility] prior to scheduling an appt with [place].   Justin Lin Johnisha Louks, PA  07/09/2018, 1:11 PM

## 2018-07-09 NOTE — Patient Instructions (Signed)

## 2018-07-15 ENCOUNTER — Telehealth: Payer: Self-pay

## 2018-07-15 NOTE — Telephone Encounter (Signed)
Spoke with [name] who called to cancel pt's surgery 2/14. It has been rescheduled for 08/09/18, she said per pt "he wants it next month". New surgery date/time provided, she verbalized understanding. No questions at this time.

## 2018-07-29 ENCOUNTER — Telehealth: Payer: Self-pay | Admitting: *Deleted

## 2018-07-29 NOTE — Telephone Encounter (Signed)
Left message on voicemail for patient and significant other JOYCE of time change for 08/09/2018 surgery. Instructed to be at [facility] admitting at 12:30 pm. All other instructions unchanged.

## 2018-08-08 ENCOUNTER — Telehealth: Payer: Self-pay | Admitting: *Deleted

## 2018-08-08 NOTE — Progress Notes (Signed)
I spoke with Mr. Colgate who reported that he has the flu and will not be coming for surgery in am.  Patient said that he saw his PCP, Dr Jeanie Cooks and he is now on a Z-PAC.  I notified Kay at [facility].

## 2018-08-08 NOTE — Telephone Encounter (Signed)
Call to Joyce(patient's ex-wife and contact) She states she has not seen patient in a   Month and is unaware of any medical reason patient cancelled his surgery. She states patient is "doing stuff he should not be doing" and is " contacting Dr. Marval Regal  ". I informed her to have patient call this office when ready to re-schedule procedure. He has cancelled it twice.

## 2018-08-09 ENCOUNTER — Ambulatory Visit (HOSPITAL_COMMUNITY): Admission: RE | Admit: 2018-08-09 | Payer: Medicare HMO | Source: Home / Self Care | Admitting: Surgery

## 2018-08-09 ENCOUNTER — Encounter (HOSPITAL_COMMUNITY): Admission: RE | Payer: Self-pay | Source: Home / Self Care

## 2018-08-09 SURGERY — INSERTION OF ARTERIOVENOUS (AV) GORE-TEX GRAFT ARM
Anesthesia: Monitor Anesthesia Care | Laterality: Left

## 2019-01-16 ENCOUNTER — Other Ambulatory Visit: Payer: Self-pay

## 2019-01-16 ENCOUNTER — Emergency Department (HOSPITAL_COMMUNITY): Payer: Medicare HMO

## 2019-01-16 ENCOUNTER — Encounter (HOSPITAL_COMMUNITY): Payer: Self-pay | Admitting: Family Medicine

## 2019-01-16 ENCOUNTER — Inpatient Hospital Stay (HOSPITAL_COMMUNITY)
Admission: EM | Admit: 2019-01-16 | Discharge: 2019-01-18 | DRG: 291 | Disposition: A | Discharge status: Home / Self Care | Payer: Medicare HMO | Attending: Internal Medicine | Admitting: Internal Medicine

## 2019-01-16 DIAGNOSIS — D571 Sickle-cell disease without crisis: Secondary | ICD-10-CM | POA: Diagnosis present

## 2019-01-16 DIAGNOSIS — N185 Chronic kidney disease, stage 5: Secondary | ICD-10-CM | POA: Diagnosis not present

## 2019-01-16 DIAGNOSIS — Z87891 Personal history of nicotine dependence: Secondary | ICD-10-CM

## 2019-01-16 DIAGNOSIS — E1122 Type 2 diabetes mellitus with diabetic chronic kidney disease: Secondary | ICD-10-CM | POA: Diagnosis present

## 2019-01-16 DIAGNOSIS — Z794 Long term (current) use of insulin: Secondary | ICD-10-CM

## 2019-01-16 DIAGNOSIS — F1410 Cocaine abuse, uncomplicated: Secondary | ICD-10-CM | POA: Diagnosis not present

## 2019-01-16 DIAGNOSIS — I132 Hypertensive heart and chronic kidney disease with heart failure and with stage 5 chronic kidney disease, or end stage renal disease: Secondary | ICD-10-CM | POA: Diagnosis not present

## 2019-01-16 DIAGNOSIS — I5033 Acute on chronic diastolic (congestive) heart failure: Secondary | ICD-10-CM | POA: Diagnosis not present

## 2019-01-16 DIAGNOSIS — D649 Anemia, unspecified: Secondary | ICD-10-CM | POA: Diagnosis not present

## 2019-01-16 DIAGNOSIS — D631 Anemia in chronic kidney disease: Secondary | ICD-10-CM | POA: Diagnosis present

## 2019-01-16 DIAGNOSIS — Z9114 Patient's other noncompliance with medication regimen: Secondary | ICD-10-CM

## 2019-01-16 DIAGNOSIS — Z833 Family history of diabetes mellitus: Secondary | ICD-10-CM

## 2019-01-16 DIAGNOSIS — K219 Gastro-esophageal reflux disease without esophagitis: Secondary | ICD-10-CM | POA: Diagnosis present

## 2019-01-16 DIAGNOSIS — I5043 Acute on chronic combined systolic (congestive) and diastolic (congestive) heart failure: Secondary | ICD-10-CM | POA: Diagnosis present

## 2019-01-16 DIAGNOSIS — E1121 Type 2 diabetes mellitus with diabetic nephropathy: Secondary | ICD-10-CM

## 2019-01-16 DIAGNOSIS — I509 Heart failure, unspecified: Secondary | ICD-10-CM

## 2019-01-16 DIAGNOSIS — Z885 Allergy status to narcotic agent status: Secondary | ICD-10-CM

## 2019-01-16 DIAGNOSIS — Z79899 Other long term (current) drug therapy: Secondary | ICD-10-CM

## 2019-01-16 DIAGNOSIS — I10 Essential (primary) hypertension: Secondary | ICD-10-CM | POA: Diagnosis present

## 2019-01-16 DIAGNOSIS — Z9049 Acquired absence of other specified parts of digestive tract: Secondary | ICD-10-CM

## 2019-01-16 DIAGNOSIS — E1129 Type 2 diabetes mellitus with other diabetic kidney complication: Secondary | ICD-10-CM | POA: Diagnosis present

## 2019-01-16 DIAGNOSIS — Z20828 Contact with and (suspected) exposure to other viral communicable diseases: Secondary | ICD-10-CM | POA: Diagnosis present

## 2019-01-16 DIAGNOSIS — Z85038 Personal history of other malignant neoplasm of large intestine: Secondary | ICD-10-CM

## 2019-01-16 DIAGNOSIS — E871 Hypo-osmolality and hyponatremia: Secondary | ICD-10-CM | POA: Diagnosis present

## 2019-01-16 LAB — CBC WITH DIFFERENTIAL/PLATELET
Abs Immature Granulocytes: 0 10*3/uL (ref 0.00–0.07)
Basophils Absolute: 0.2 10*3/uL — ABNORMAL HIGH (ref 0.0–0.1)
Basophils Relative: 1 %
Eosinophils Absolute: 1 10*3/uL — ABNORMAL HIGH (ref 0.0–0.5)
Eosinophils Relative: 4 %
HCT: 12.4 % — ABNORMAL LOW (ref 39.0–52.0)
Hemoglobin: 3.8 g/dL — CL (ref 13.0–17.0)
Lymphocytes Relative: 8 %
Lymphs Abs: 1.9 10*3/uL (ref 0.7–4.0)
MCH: 19.4 pg — ABNORMAL LOW (ref 26.0–34.0)
MCHC: 30.6 g/dL (ref 30.0–36.0)
MCV: 63.3 fL — ABNORMAL LOW (ref 80.0–100.0)
Monocytes Absolute: 1 10*3/uL (ref 0.1–1.0)
Monocytes Relative: 4 %
Neutro Abs: 19.8 10*3/uL — ABNORMAL HIGH (ref 1.7–7.7)
Neutrophils Relative %: 83 %
Platelets: 567 10*3/uL — ABNORMAL HIGH (ref 150–400)
RBC: 1.96 MIL/uL — ABNORMAL LOW (ref 4.22–5.81)
RDW: 21.9 % — ABNORMAL HIGH (ref 11.5–15.5)
WBC: 23.9 10*3/uL — ABNORMAL HIGH (ref 4.0–10.5)
nRBC: 0.9 % — ABNORMAL HIGH (ref 0.0–0.2)
nRBC: 1 /100 WBC — ABNORMAL HIGH

## 2019-01-16 LAB — COMPREHENSIVE METABOLIC PANEL
ALT: 7 U/L (ref 0–44)
AST: 11 U/L — ABNORMAL LOW (ref 15–41)
Albumin: 2.4 g/dL — ABNORMAL LOW (ref 3.5–5.0)
Alkaline Phosphatase: 90 U/L (ref 38–126)
Anion gap: 10 (ref 5–15)
BUN: 34 mg/dL — ABNORMAL HIGH (ref 8–23)
CO2: 17 mmol/L — ABNORMAL LOW (ref 22–32)
Calcium: 8.3 mg/dL — ABNORMAL LOW (ref 8.9–10.3)
Chloride: 102 mmol/L (ref 98–111)
Creatinine, Ser: 3.6 mg/dL — ABNORMAL HIGH (ref 0.61–1.24)
GFR calc Af Amer: 19 mL/min — ABNORMAL LOW (ref >60)
GFR calc non Af Amer: 16 mL/min — ABNORMAL LOW (ref >60)
Glucose, Bld: 171 mg/dL — ABNORMAL HIGH (ref 70–99)
Potassium: 4.2 mmol/L (ref 3.5–5.1)
Sodium: 129 mmol/L — ABNORMAL LOW (ref 135–145)
Total Bilirubin: 1.1 mg/dL (ref 0.3–1.2)
Total Protein: 7.3 g/dL (ref 6.5–8.1)

## 2019-01-16 LAB — URINALYSIS, ROUTINE W REFLEX MICROSCOPIC
Bilirubin Urine: NEGATIVE
Glucose, UA: NEGATIVE mg/dL
Hgb urine dipstick: NEGATIVE
Ketones, ur: NEGATIVE mg/dL
Leukocytes,Ua: NEGATIVE
Nitrite: NEGATIVE
Protein, ur: NEGATIVE mg/dL
Specific Gravity, Urine: 1.004 — ABNORMAL LOW (ref 1.005–1.030)
pH: 5 (ref 5.0–8.0)

## 2019-01-16 LAB — SARS CORONAVIRUS 2 BY RT PCR (HOSPITAL ORDER, PERFORMED IN ~~LOC~~ HOSPITAL LAB): SARS Coronavirus 2: NEGATIVE

## 2019-01-16 LAB — RAPID URINE DRUG SCREEN, HOSP PERFORMED
Amphetamines: NOT DETECTED
Barbiturates: NOT DETECTED
Benzodiazepines: NOT DETECTED
Cocaine: POSITIVE — AB
Opiates: NOT DETECTED
Tetrahydrocannabinol: NOT DETECTED

## 2019-01-16 LAB — POC OCCULT BLOOD, ED: Fecal Occult Bld: NEGATIVE

## 2019-01-16 LAB — BRAIN NATRIURETIC PEPTIDE: B Natriuretic Peptide: 1036.2 pg/mL — ABNORMAL HIGH (ref 0.0–100.0)

## 2019-01-16 LAB — GLUCOSE, CAPILLARY: Glucose-Capillary: 109 mg/dL — ABNORMAL HIGH (ref 70–99)

## 2019-01-16 LAB — PREPARE RBC (CROSSMATCH)

## 2019-01-16 MED ORDER — ATORVASTATIN CALCIUM 10 MG PO TABS
20.0000 mg | ORAL_TABLET | Freq: Every day | ORAL | Status: DC
  Administered 2019-01-17: 20 mg via ORAL
  Filled 2019-01-16: qty 2

## 2019-01-16 MED ORDER — INSULIN ASPART 100 UNIT/ML ~~LOC~~ SOLN: 0.0000 [IU] | Freq: Every day | SUBCUTANEOUS | Status: DC

## 2019-01-16 MED ORDER — CALCITRIOL 0.25 MCG PO CAPS
0.2500 ug | ORAL_CAPSULE | Freq: Every day | ORAL | Status: DC
  Administered 2019-01-17 – 2019-01-18 (×2): 0.25 ug via ORAL
  Filled 2019-01-16 (×2): qty 1

## 2019-01-16 MED ORDER — FOLIC ACID 1 MG PO TABS
1.0000 mg | ORAL_TABLET | Freq: Every day | ORAL | Status: DC
  Administered 2019-01-17 – 2019-01-18 (×2): 1 mg via ORAL
  Filled 2019-01-16 (×2): qty 1

## 2019-01-16 MED ORDER — PANTOPRAZOLE SODIUM 40 MG PO TBEC
40.0000 mg | DELAYED_RELEASE_TABLET | Freq: Every day | ORAL | Status: DC
  Administered 2019-01-17 – 2019-01-18 (×2): 40 mg via ORAL
  Filled 2019-01-16 (×2): qty 1

## 2019-01-16 MED ORDER — SODIUM CHLORIDE 0.9% FLUSH: 3.0000 mL | INTRAVENOUS | Status: DC | PRN

## 2019-01-16 MED ORDER — ACETAMINOPHEN 325 MG PO TABS
650.0000 mg | ORAL_TABLET | ORAL | Status: DC | PRN
  Administered 2019-01-17 – 2019-01-18 (×3): 650 mg via ORAL
  Filled 2019-01-16 (×3): qty 2

## 2019-01-16 MED ORDER — ALBUTEROL SULFATE (2.5 MG/3ML) 0.083% IN NEBU: 3.0000 mL | INHALATION_SOLUTION | Freq: Four times a day (QID) | RESPIRATORY_TRACT | Status: DC | PRN

## 2019-01-16 MED ORDER — SODIUM CHLORIDE 0.9% FLUSH
3.0000 mL | Freq: Two times a day (BID) | INTRAVENOUS | Status: DC
  Administered 2019-01-16 – 2019-01-17 (×3): 3 mL via INTRAVENOUS

## 2019-01-16 MED ORDER — FUROSEMIDE 10 MG/ML IJ SOLN
40.0000 mg | Freq: Two times a day (BID) | INTRAMUSCULAR | Status: DC
  Administered 2019-01-16 – 2019-01-18 (×4): 40 mg via INTRAVENOUS
  Filled 2019-01-16 (×4): qty 4

## 2019-01-16 MED ORDER — SODIUM CHLORIDE 0.9% IV SOLUTION
Freq: Once | INTRAVENOUS | Status: AC
  Administered 2019-01-16: 20:00:00 via INTRAVENOUS

## 2019-01-16 MED ORDER — GABAPENTIN 300 MG PO CAPS
300.0000 mg | ORAL_CAPSULE | Freq: Every day | ORAL | Status: DC
  Administered 2019-01-17 – 2019-01-18 (×2): 300 mg via ORAL
  Filled 2019-01-16 (×2): qty 1

## 2019-01-16 MED ORDER — AMLODIPINE BESYLATE 5 MG PO TABS
5.0000 mg | ORAL_TABLET | Freq: Every day | ORAL | Status: DC
  Administered 2019-01-17 – 2019-01-18 (×2): 5 mg via ORAL
  Filled 2019-01-16 (×2): qty 1

## 2019-01-16 MED ORDER — SODIUM CHLORIDE 0.9 % IV SOLN: 250.0000 mL | INTRAVENOUS | Status: DC | PRN

## 2019-01-16 MED ORDER — CARVEDILOL 25 MG PO TABS
25.0000 mg | ORAL_TABLET | Freq: Two times a day (BID) | ORAL | Status: DC
  Administered 2019-01-17 – 2019-01-18 (×3): 25 mg via ORAL
  Filled 2019-01-16 (×3): qty 1

## 2019-01-16 MED ORDER — INSULIN ASPART 100 UNIT/ML ~~LOC~~ SOLN
0.0000 [IU] | Freq: Three times a day (TID) | SUBCUTANEOUS | Status: DC
  Administered 2019-01-17 (×2): 2 [IU] via SUBCUTANEOUS
  Administered 2019-01-18: 1 [IU] via SUBCUTANEOUS

## 2019-01-16 MED ORDER — ONDANSETRON HCL 4 MG/2ML IJ SOLN: 4.0000 mg | Freq: Four times a day (QID) | INTRAMUSCULAR | Status: DC | PRN

## 2019-01-16 NOTE — ED Triage Notes (Signed)
Per pt he is a sickle cell pt and also is a dialysis pt and was called to day from kidney dr that his hemoglobin was low. Pt is having weakness. Pt was having sob X  2 weeks.

## 2019-01-16 NOTE — H&P (Signed)
History and Physical    [name] XTG:[id number] DOB: 1950/08/09 DOA: 01/16/2019  PCP: Nolene Ebbs, MD   Patient coming from: Home   Chief Complaint: Low Hgb, SOB, weakness   HPI: [name] is a 68 y.o. male with medical history significant for sickle cell anemia, colon cancer, chronic kidney disease stage V not on dialysis, hypertension, diabetes mellitus, and cocaine abuse, presenting to the emergency department at the direction of an outpatient clinician for evaluation of low hemoglobin.  Patient reported feeling short of breath and weak for the past 2 weeks.  He denies any significant cough, denies fevers or chills, and denies chest pain.  Reports some increase in his chronic bilateral lower extremity swelling but he has not been taking his Lasix in the past couple weeks.  He denies any melena or hematochezia, denies abdominal pain, and denies any indigestion or vomiting.  ED Course: Upon arrival to the ED, patient is found to be afebrile, saturating well on room air, and slightly hypertensive.  Chest x-ray demonstrates cardiomegaly and pulmonary vascular congestion.  EKG is pending.  Chemistry panel with sodium of 129, bicarbonate 17, and creatinine of 3.60.  CBC concerning for hemoglobin of 3.8.  3 units of packed red blood cells were ordered and hospitalists consulted for admission.  Review of Systems:  All other systems reviewed and apart from HPI, are negative.  Past Medical History:  Diagnosis Date  . Arthritis   . Cancer ([place])    colon  . Diabetes mellitus   . GERD (gastroesophageal reflux disease)   . Heart murmur   . Hypertension   . Lesion of vocal cord   . Pneumonia   . Renal insufficiency   . Sickle cell anemia ([facility])    Hgb SS disease   . Wears glasses     Past Surgical History:  Procedure Laterality Date  . AV FISTULA PLACEMENT Left 11/22/2016   Procedure: INSERTION OF ARTERIOVENOUS (AV) GORE-TEX GRAFT  LEFT FOREARM USING 6MM X 50CM GORETEX GRAFT;   Surgeon: Serafina Mitchell, MD;  Location: [facility];  Service: Vascular;  Laterality: Left;  . CATARACT EXTRACTION W/ INTRAOCULAR LENS  IMPLANT, BILATERAL    . CHOLECYSTECTOMY    . COLON SURGERY     Colon cancer surgery, removed small amt not full colectomy  . COLONOSCOPY     nclear who follows him for this  . LEG SURGERY     For ? gangrene after running into barbwire fence at 68yo     reports that he quit smoking about 52 years ago. He has a 10.00 pack-year smoking history. He has never used smokeless tobacco. He reports current alcohol use. He reports previous drug use. Drug: Cocaine.  Allergies  Allergen Reactions  . Morphine And Related Itching    Family History  Problem Relation Age of Onset  . Venous thrombosis Mother        Dec. of blood clot   . Diabetes type II Mother   . AAA (abdominal aortic aneurysm) Mother   . Throat cancer Father        Deceased of throat cancer     Prior to Admission medications   Medication Sig Start Date End Date Taking? Authorizing Provider  acetaminophen (TYLENOL) 500 MG tablet Take 500 mg by mouth every 6 (six) hours as needed for headache (pain).    [provider]  amLODipine (NORVASC) 5 MG tablet Take 5 mg by mouth daily. 10/04/17   [provider]  atorvastatin (LIPITOR) 20 MG tablet Take 20 mg by mouth every evening. 02/01/18   [provider]  calcitRIOL (ROCALTROL) 0.25 MCG capsule  04/25/18   [provider]  carvedilol (COREG) 25 MG tablet Take 25 mg by mouth 2 (two) times daily. 09/04/17   [provider]  Docusate Calcium (STOOL SOFTENER PO) Take 1 capsule by mouth daily as needed (constipation).    [provider]  folic acid (FOLVITE) 1 MG tablet Take 1 tablet (1 mg total) by mouth daily. 04/18/13   Robbie Lis, MD  furosemide (LASIX) 80 MG tablet Take 0.5 tablets (40 mg total) by mouth daily as needed for edema (Weight more than 2 pounds above 177 pounds). 04/02/17   Leana Gamer, MD  gabapentin (NEURONTIN) 300 MG capsule Take 300 mg by mouth at bedtime.  10/25/17   [provider]  insulin aspart (NOVOLOG FLEXPEN) 100 UNIT/ML FlexPen Inject 2-6 Units into the skin 3 (three) times daily as needed for high blood sugar (CBG >150).    [provider]  omeprazole (PRILOSEC) 20 MG capsule Take 20 mg by mouth daily.     [provider]  ONGLYZA 5 MG TABS tablet  06/21/18   [provider]  polyethylene glycol powder (MIRALAX) powder Take 17 g by mouth daily. Patient taking differently: Take 17 g by mouth daily as needed (constipation). Mix in 8 oz liquid and drink 10/28/16   Palumbo, April, MD  Sennosides (SENNA LAXATIVE PO) Take 1 tablet by mouth daily as needed (constipation).    [provider]  VENTOLIN HFA 108 (90 Base) MCG/ACT inhaler Inhale 2 puffs into the lungs every 6 (six) hours as needed for wheezing.  09/03/17   [provider]    Physical Exam: Vitals:   01/16/19 1559 01/16/19 1800 01/16/19 1830  BP: 133/60 136/70 (!) 160/76  Pulse: 74 71 75  Resp: 18 (!) 22 17  Temp: 98.9 F (37.2 C)    TempSrc: Oral    SpO2: 98% 99% 98%    Constitutional: NAD, calm  Eyes: PERTLA, lids and conjunctivae normal ENMT: Mucous membranes are moist. Posterior pharynx clear of any exudate or lesions.   Neck: normal, supple, no masses, no thyromegaly Respiratory: no wheezing, no crackles. Normal respiratory effort. No accessory muscle use.  Cardiovascular: S1 & S2 heard, regular rate and rhythm. Pretibial pitting edema bilaterally. Abdomen: No distension, no tenderness, soft. Bowel sounds active.  Musculoskeletal: no clubbing / cyanosis. No joint deformity upper and lower extremities.    Skin: no significant rashes, lesions, ulcers. Warm, dry, well-perfused. Neurologic: no gross facial asymmetry. Sensation intact. Moving all extremities.  Psychiatric: Alert and oriented x 3. Pleasant, cooperative.    Labs on  Admission: I have personally reviewed following labs and imaging studies  CBC: Recent Labs  Lab 01/16/19 1820  WBC 23.9*  NEUTROABS 19.8*  HGB 3.8*  HCT 12.4*  MCV 63.3*  PLT 297*   Basic Metabolic Panel: Recent Labs  Lab 01/16/19 1820  NA 129*  K 4.2  CL 102  CO2 17*  GLUCOSE 171*  BUN 34*  CREATININE 3.60*  CALCIUM 8.3*   GFR: CrCl cannot be calculated (Unknown ideal weight.). Liver Function Tests: Recent Labs  Lab 01/16/19 1820  AST 11*  ALT 7  ALKPHOS 90  BILITOT 1.1  PROT 7.3  ALBUMIN 2.4*   No results for input(s): LIPASE, AMYLASE in the last 168 hours. No results for input(s): AMMONIA in the last 168  hours. Coagulation Profile: No results for input(s): INR, PROTIME in the last 168 hours. Cardiac Enzymes: No results for input(s): CKTOTAL, CKMB, CKMBINDEX, TROPONINI in the last 168 hours. BNP (last 3 results) No results for input(s): PROBNP in the last 8760 hours. HbA1C: No results for input(s): HGBA1C in the last 72 hours. CBG: No results for input(s): GLUCAP in the last 168 hours. Lipid Profile: No results for input(s): CHOL, HDL, LDLCALC, TRIG, CHOLHDL, LDLDIRECT in the last 72 hours. Thyroid Function Tests: No results for input(s): TSH, T4TOTAL, FREET4, T3FREE, THYROIDAB in the last 72 hours. Anemia Panel: No results for input(s): VITAMINB12, FOLATE, FERRITIN, TIBC, IRON, RETICCTPCT in the last 72 hours. Urine analysis:    Component Value Date/Time   COLORURINE YELLOW 02/08/2018 2137   APPEARANCEUR CLEAR 02/08/2018 2137   LABSPEC 1.010 02/08/2018 2137   PHURINE 5.0 02/08/2018 2137   GLUCOSEU NEGATIVE 02/08/2018 2137   HGBUR SMALL (A) 02/08/2018 2137   BILIRUBINUR NEGATIVE 02/08/2018 2137   [place] NEGATIVE 02/08/2018 2137   PROTEINUR 30 (A) 02/08/2018 2137   UROBILINOGEN 1.0 09/06/2014 2203   NITRITE NEGATIVE 02/08/2018 2137   LEUKOCYTESUR NEGATIVE 02/08/2018 2137   Sepsis Labs: @LABRCNTIP (procalcitonin:4,lacticidven:4) )No results  found for this or any previous visit (from the past 240 hour(s)).   Radiological Exams on Admission: No results found.  EKG: ordered, not yet performed.    Assessment/Plan   1. Symptomatic anemia  - Hgb is 3.8, down from 7.8 a year ago  - He is symptomatic with SOB and gen weakness  - HR is normal in ED and BP slightly elevated  - He denies melena or hematochezia, but given his age and hx of colon cancer, will need to check FOBT  - Anemia panel pending  - 3 units RBC ordered, will need diuresis with this - Check post-transfusion H&H    2. CKD stage V  - SCr is 3.60 on admission with normal potassium, bicarb 17, hypervolemia without resp distress  - He follows with nephrology, not yet on HD, has access that he states is not functional  - Renally-dose medications, continue calcitriol, Lasix  3. Acute on chronic diastolic CHF  - Pt reports increased leg swelling, CXR with vascular congestion, has not used Lasix in ~2 wks  - He has 3 units RBC's ordered and will need diuresis  - Start with Lasix 40 mg IV q12h and adjust as needed, follow daily wt and I/O's, continue Coreg as tolerated    4. Hypertension  - BP elevated in ED in setting of hypervolemia  - Continue Norvasc, Coreg   5. Type II DM  - A1c was 5.6% in 2019  - Managed at home with Onglyza and Novolog  - Continue CBG's, Novolog   6. Substance abuse   - UDS positive for cocaine, patient counseled    7. Sickle cell disease  - No pain complaints, Hgb low as above, continue folate and supportive care    8. Hyponatremia  - Sodium is 129 in ED in setting of hypervolemia and renal insufficiency  - He is being diuresed, will repeat chem panel in am    PPE: Mask, face shield  DVT prophylaxis: SCD's  Code Status: Full  Family Communication: Discussed with patient  Consults called: none  Admission status: Observation     Vianne Bulls, MD [facility] Pager [phone]  If 7PM-7AM, please contact  night-coverage [email or web address] Password [facility]  01/16/2019, 7:18 PM

## 2019-01-16 NOTE — ED Notes (Signed)
ED TO INPATIENT HANDOFF REPORT  ED Nurse Name and Phone #:  [phone]  S Name/Age/Gender [name] 68 y.o. male Room/Bed: 031C/031C  Code Status   Code Status: Prior  Home/SNF/Other Home Patient oriented to: self, place, time and situation Is this baseline? Yes   Triage Complete: Triage complete  Chief Complaint Low hemoglobin  Triage Note Per pt he is a sickle cell pt and also is a dialysis pt and was called to day from kidney dr that his hemoglobin was low. Pt is having weakness. Pt was having sob X  2 weeks.    Allergies Allergies  Allergen Reactions  . Morphine And Related Itching    Level of Care/Admitting Diagnosis ED Disposition    ED Disposition Condition [facility] Area: [place] [[id number]]  Level of Care: Telemetry Medical [104]  I expect the patient will be discharged within 24 hours: Yes  LOW acuity---Tx typically complete <24 hrs---ACUTE conditions typically can be evaluated <24 hours---LABS likely to return to acceptable levels <24 hours---IS near functional baseline---EXPECTED to return to current living arrangement---NOT newly hypoxic: Does not meet criteria for 5C-Observation unit  Covid Evaluation: Asymptomatic Screening Protocol (No Symptoms)  Diagnosis: Symptomatic anemia [9211941]  Admitting Physician: Vianne Bulls [[id number]]  Attending Physician: Vianne Bulls [[id number]]  PT Class (Do Not Modify): Observation [104]  PT Acc Code (Do Not Modify): Observation [10022]       B Medical/Surgery History Past Medical History:  Diagnosis Date  . Arthritis   . Cancer ([place])    colon  . Diabetes mellitus   . GERD (gastroesophageal reflux disease)   . Heart murmur   . Hypertension   . Lesion of vocal cord   . Pneumonia   . Renal insufficiency   . Sickle cell anemia (HCC)    Hgb SS disease   . Wears glasses    Past Surgical History:  Procedure Laterality Date  . AV FISTULA PLACEMENT Left 11/22/2016    Procedure: INSERTION OF ARTERIOVENOUS (AV) GORE-TEX GRAFT  LEFT FOREARM USING 6MM X 50CM GORETEX GRAFT;  Surgeon: Serafina Mitchell, MD;  Location: [facility];  Service: Vascular;  Laterality: Left;  . CATARACT EXTRACTION W/ INTRAOCULAR LENS  IMPLANT, BILATERAL    . CHOLECYSTECTOMY    . COLON SURGERY     Colon cancer surgery, removed small amt not full colectomy  . COLONOSCOPY     nclear who follows him for this  . LEG SURGERY     For ? gangrene after running into barbwire fence at 68yo     A IV Location/Drains/Wounds Patient Lines/Drains/Airways Status   Active Line/Drains/Airways    Name:   Placement date:   Placement time:   Site:   Days:   Peripheral IV 01/16/19 Right Antecubital   01/16/19    1820    Antecubital   less than 1   Fistula / Graft Left Forearm Arteriovenous vein graft   11/22/16    1337    Forearm   785   Incision (Closed) 11/22/16 Arm Left   11/22/16    1445     785          Intake/Output Last 24 hours No intake or output data in the 24 hours ending 01/16/19 2108  Labs/Imaging Results for orders placed or performed during the hospital encounter of 01/16/19 (from the past 48 hour(s))  CBC with Differential     Status: Abnormal   Collection Time:  01/16/19  6:20 PM  Result Value Ref Range   WBC 23.9 (H) 4.0 - 10.5 K/uL   RBC 1.96 (L) 4.22 - 5.81 MIL/uL   Hemoglobin 3.8 (LL) 13.0 - 17.0 g/dL    Comment: REPEATED TO VERIFY Reticulocyte Hemoglobin testing may be clinically indicated, consider ordering this additional test [id number] THIS CRITICAL RESULT HAS VERIFIED AND BEEN CALLED TO N.KOONTZ RN BY IMANI MANNING ON 08 13 2020 AT 1847, AND HAS BEEN READ BACK.     HCT 12.4 (L) 39.0 - 52.0 %   MCV 63.3 (L) 80.0 - 100.0 fL   MCH 19.4 (L) 26.0 - 34.0 pg   MCHC 30.6 30.0 - 36.0 g/dL   RDW 21.9 (H) 11.5 - 15.5 %   Platelets 567 (H) 150 - 400 K/uL    Comment: REPEATED TO VERIFY   nRBC 0.9 (H) 0.0 - 0.2 %   Neutrophils Relative % 83 %   Neutro Abs 19.8 (H) 1.7 - 7.7  K/uL   Lymphocytes Relative 8 %   Lymphs Abs 1.9 0.7 - 4.0 K/uL   Monocytes Relative 4 %   Monocytes Absolute 1.0 0.1 - 1.0 K/uL   Eosinophils Relative 4 %   Eosinophils Absolute 1.0 (H) 0.0 - 0.5 K/uL   Basophils Relative 1 %   Basophils Absolute 0.2 (H) 0.0 - 0.1 K/uL   nRBC 1 (H) 0 /100 WBC   Abs Immature Granulocytes 0.00 0.00 - 0.07 K/uL   Tear Drop Cells PRESENT    Polychromasia PRESENT    Target Cells PRESENT     Comment: Performed at [facility], [address] [place], [place] [address]  Type and screen [place]     Status: None (Preliminary result)   Collection Time: 01/16/19  6:20 PM  Result Value Ref Range   ABO/RH(D) AB POS    Antibody Screen NEG    Sample Expiration 01/19/2019,2359    Unit Number [id number]    Blood Component Type RED CELLS,LR    Unit division 00    Status of Unit ISSUED    Transfusion Status OK TO TRANSFUSE    Crossmatch Result      Compatible Performed at [facility], [place] [address], [place], [place] [address]    Unit Number [id number]    Blood Component Type RED CELLS,LR    Unit division 00    Status of Unit ALLOCATED    Transfusion Status OK TO TRANSFUSE    Crossmatch Result Compatible    Unit Number [id number]    Blood Component Type RED CELLS,LR    Unit division 00    Status of Unit ALLOCATED    Transfusion Status OK TO TRANSFUSE    Crossmatch Result Compatible   Comprehensive metabolic panel     Status: Abnormal   Collection Time: 01/16/19  6:20 PM  Result Value Ref Range   Sodium 129 (L) 135 - 145 mmol/L   Potassium 4.2 3.5 - 5.1 mmol/L   Chloride 102 98 - 111 mmol/L   CO2 17 (L) 22 - 32 mmol/L   Glucose, Bld 171 (H) 70 - 99 mg/dL   BUN 34 (H) 8 - 23 mg/dL   Creatinine, Ser 3.60 (H) 0.61 - 1.24 mg/dL   Calcium 8.3 (L) 8.9 - 10.3 mg/dL   Total Protein 7.3 6.5 - 8.1 g/dL   Albumin 2.4 (L) 3.5 - 5.0 g/dL   AST 11 (L) 15 - 41 U/L   ALT 7 0 -  44 U/L   Alkaline Phosphatase 90 38 - 126  U/L   Total Bilirubin 1.1 0.3 - 1.2 mg/dL   GFR calc non Af Amer 16 (L) >60 mL/min   GFR calc Af Amer 19 (L) >60 mL/min   Anion gap 10 5 - 15    Comment: Performed at [facility] [address], [place], [place] [address]  Brain natriuretic peptide     Status: Abnormal   Collection Time: 01/16/19  6:20 PM  Result Value Ref Range   B Natriuretic Peptide 1,036.2 (H) 0.0 - 100.0 pg/mL    Comment: Performed at [facility] [address] [place], [place] [address]  Urinalysis, Routine w reflex microscopic     Status: Abnormal   Collection Time: 01/16/19  6:22 PM  Result Value Ref Range   Color, Urine STRAW (A) YELLOW   APPearance CLEAR CLEAR   Specific Gravity, Urine 1.004 (L) 1.005 - 1.030   pH 5.0 5.0 - 8.0   Glucose, UA NEGATIVE NEGATIVE mg/dL   Hgb urine dipstick NEGATIVE NEGATIVE   Bilirubin Urine NEGATIVE NEGATIVE   Ketones, ur NEGATIVE NEGATIVE mg/dL   Protein, ur NEGATIVE NEGATIVE mg/dL   Nitrite NEGATIVE NEGATIVE   Leukocytes,Ua NEGATIVE NEGATIVE    Comment: Performed at [facility] [address] [place], [place] [address]  Rapid urine drug screen (hospital performed)     Status: Abnormal   Collection Time: 01/16/19  6:22 PM  Result Value Ref Range   Opiates NONE DETECTED NONE DETECTED   Cocaine POSITIVE (A) NONE DETECTED   Benzodiazepines NONE DETECTED NONE DETECTED   Amphetamines NONE DETECTED NONE DETECTED   Tetrahydrocannabinol NONE DETECTED NONE DETECTED   Barbiturates NONE DETECTED NONE DETECTED    Comment: (NOTE) DRUG SCREEN FOR MEDICAL PURPOSES ONLY.  IF CONFIRMATION IS NEEDED FOR ANY PURPOSE, NOTIFY LAB WITHIN 5 DAYS. LOWEST DETECTABLE LIMITS FOR URINE DRUG SCREEN Drug Class                     Cutoff (ng/mL) Amphetamine and metabolites    1000 Barbiturate and metabolites    200 Benzodiazepine                 341 Tricyclics and metabolites     300 Opiates and metabolites        300 Cocaine and metabolites        300 THC                             50 Performed at [facility], [place] [address], [place], [place] [address]   SARS Coronavirus 2 [facility] order, Performed in [facility] hospital lab) Nasopharyngeal Nasopharyngeal Swab     Status: None   Collection Time: 01/16/19  7:06 PM   Specimen: Nasopharyngeal Swab  Result Value Ref Range   SARS Coronavirus 2 NEGATIVE NEGATIVE    Comment: (NOTE) If result is NEGATIVE SARS-CoV-2 target nucleic acids are NOT DETECTED. The SARS-CoV-2 RNA is generally detectable in upper and lower  respiratory specimens during the acute phase of infection. The lowest  concentration of SARS-CoV-2 viral copies this assay can detect is 250  copies / mL. A negative result does not preclude SARS-CoV-2 infection  and should not be used as the sole basis for treatment or other  patient management decisions.  A negative result may occur with  improper specimen collection / handling,  submission of specimen other  than nasopharyngeal swab, presence of viral mutation(s) within the  areas targeted by this assay, and inadequate number of viral copies  (<250 copies / mL). A negative result must be combined with clinical  observations, patient history, and epidemiological information. If result is POSITIVE SARS-CoV-2 target nucleic acids are DETECTED. The SARS-CoV-2 RNA is generally detectable in upper and lower  respiratory specimens dur ing the acute phase of infection.  Positive  results are indicative of active infection with SARS-CoV-2.  Clinical  correlation with patient history and other diagnostic information is  necessary to determine patient infection status.  Positive results do  not rule out bacterial infection or co-infection with other viruses. If result is PRESUMPTIVE POSTIVE SARS-CoV-2 nucleic acids MAY BE PRESENT.   A presumptive positive result was obtained on the submitted specimen  and confirmed on repeat testing.  While 2019 novel coronavirus  (SARS-CoV-2) nucleic  acids may be present in the submitted sample  additional confirmatory testing may be necessary for epidemiological  and / or clinical management purposes  to differentiate between  SARS-CoV-2 and other Sarbecovirus currently known to infect humans.  If clinically indicated additional testing with an alternate test  methodology [id number] is advised. The SARS-CoV-2 RNA is generally  detectable in upper and lower respiratory sp ecimens during the acute  phase of infection. The expected result is Negative. Fact Sheet for Patients:  StrictlyIdeas.no Fact Sheet for Healthcare Providers: BankingDealers.co.za This test is not yet approved or cleared by the [place] FDA and has been authorized for detection and/or diagnosis of SARS-CoV-2 by FDA under an Emergency Use Authorization (EUA).  This EUA will remain in effect (meaning this test can be used) for the duration of the COVID-19 declaration under Section 564(b)(1) of the Act, 21 U.S.C. section 360bbb-3(b)(1), unless the authorization is terminated or revoked sooner. Performed at [facility], [place] [address], [place], [place] [address]   POC occult blood, ED     Status: None   Collection Time: 01/16/19  7:19 PM  Result Value Ref Range   Fecal Occult Bld NEGATIVE NEGATIVE  Prepare RBC     Status: None   Collection Time: 01/16/19  7:30 PM  Result Value Ref Range   Order Confirmation      ORDER PROCESSED BY BLOOD BANK Performed at [facility], [place] [address], [place], [place] [address]    Dg Chest Port 1 View  Result Date: 01/16/2019 CLINICAL DATA:  Shortness of breath EXAM: PORTABLE CHEST 1 VIEW COMPARISON:  February 08, 2018. FINDINGS: There is mild cardiomegaly. There is prominence to the central pulmonary vasculature. No large airspace consolidation. Again noted are sclerosis of bilateral humeral heads. IMPRESSION: Cardiomegaly and pulmonary vascular congestion.  Electronically Signed   By: Prudencio Pair M.D.   On: 01/16/2019 19:17    Pending Labs Unresulted Labs (From admission, onward)    Start     Ordered   01/16/19 1913  Vitamin B12  (Anemia Panel (PNL))  ONCE - STAT,   STAT     01/16/19 1913   01/16/19 1913  Folate  (Anemia Panel (PNL))  ONCE - STAT,   STAT     01/16/19 1913   01/16/19 1913  Iron and TIBC  (Anemia Panel (PNL))  ONCE - STAT,   STAT     01/16/19 1913   01/16/19 1913  Ferritin  (Anemia Panel (PNL))  ONCE - STAT,   STAT     01/16/19  1913   01/16/19 1913  Reticulocytes  (Anemia Panel (PNL))  ONCE - STAT,   STAT     01/16/19 1913   Signed and Held  HIV antibody (Routine Testing)  Tomorrow morning,   R     Signed and Held   Signed and Held  Basic metabolic panel  Daily,   R     Signed and Held          Vitals/Pain Today's Vitals   01/16/19 1945 01/16/19 1949 01/16/19 2000 01/16/19 2015  BP: (!) 137/55 136/63 (!) 135/50 (!) 133/51  Pulse: 75 71 72 72  Resp: 20 15 19 17   Temp:  98.3 F (36.8 C) 98.3 F (36.8 C) 98.3 F (36.8 C)  TempSrc:  Oral Oral Oral  SpO2: 98% 97% 97% 95%  PainSc:        Isolation Precautions No active isolations  Medications Medications  0.9 %  sodium chloride infusion (Manually program via Guardrails IV Fluids) ( Intravenous New Bag/Given 01/16/19 1952)    Mobility walks Low fall risk   Focused Assessments Cardiac Assessment Handoff:  Cardiac Rhythm: Normal sinus rhythm Lab Results  Component Value Date   CKTOTAL 102 06/02/2016   CKMB 6.7 (HH) 09/03/2014   TROPONINI <0.03 02/09/2018   No results found for: DDIMER Does the Patient currently have chest pain? No     R Recommendations: See Admitting Provider Note  Report given to:   Additional Notes:

## 2019-01-16 NOTE — ED Notes (Signed)
Consent obtained for blood transfusion by this RN. Placed at bedside.

## 2019-01-16 NOTE — Progress Notes (Signed)
Patient no longer needs PIV MD placed PIV via ultrasound

## 2019-01-16 NOTE — ED Provider Notes (Signed)
[facility] EMERGENCY DEPARTMENT Provider Note   CSN: [id number] Arrival date & time: 01/16/19  1554    History   Chief Complaint Chief Complaint  Patient presents with  . Abnormal Lab    HPI [name] is a 68 y.o. male with PMH Sickle Cell Disease, CKD 5, DM, HTN, GERD, Remote Colon Cancer, HFrEF, hx cocaine abuse.  History provided by patient and wife at bedside.   Patient reports weakness and DOE x 2 weeks.  States that he was seen by his kidney doctor this morning and had some labs.  He was called by his kidney doctor and informed that his hemoglobin was low, "3 or 4."  Patient also reports that he stopped taking Lasix 2 weeks ago.  Has been reporting orthopnea over the last 2 weeks and increasing swelling in his bilateral lower extremities.  Reports last cocaine use 2 weeks ago.  Denies fevers, chills, congestion, sore throat, loss of taste or smell, chest pain.  Denies hematochezia or melena.  Denies hematuria.  Denies shortness of breath at rest.  Past Medical History:  Diagnosis Date  . Arthritis   . Cancer ([place])    colon  . Diabetes mellitus   . GERD (gastroesophageal reflux disease)   . Heart murmur   . Hypertension   . Lesion of vocal cord   . Pneumonia   . Renal insufficiency   . Sickle cell anemia (HCC)    Hgb SS disease   . Wears glasses     Patient Active Problem List   Diagnosis Date Noted  . Sepsis ([place]) 02/08/2018  . Opiate overdose ([place]) 11/13/2017  . Acute urinary obstruction 03/29/2017  . Symptomatic anemia   . Hb-SS disease with crisis ([place]) 03/27/2017  . Sickle cell pain crisis ([place]) 01/23/2017  . Heart murmur 03/27/2016  . CKD (chronic kidney disease) stage 5, GFR less than 15 ml/min (HCC) 03/26/2016  . Cocaine abuse ([place]) 03/25/2016  . Renal failure (ARF), acute on chronic (HCC)   . Hb-SS disease without crisis ([place])   . Hyperkalemia, diminished renal excretion 09/03/2014  . Leukocytosis 07/28/2011  . LBBB (left  bundle branch block) 07/28/2011  . Hypertension   . Type 2 diabetes mellitus with renal complication [facility])     Past Surgical History:  Procedure Laterality Date  . AV FISTULA PLACEMENT Left 11/22/2016   Procedure: INSERTION OF ARTERIOVENOUS (AV) GORE-TEX GRAFT  LEFT FOREARM USING 6MM X 50CM GORETEX GRAFT;  Surgeon: Serafina Mitchell, MD;  Location: [facility];  Service: Vascular;  Laterality: Left;  . CATARACT EXTRACTION W/ INTRAOCULAR LENS  IMPLANT, BILATERAL    . CHOLECYSTECTOMY    . COLON SURGERY     Colon cancer surgery, removed small amt not full colectomy  . COLONOSCOPY     nclear who follows him for this  . LEG SURGERY     For ? gangrene after running into barbwire fence at 68yo        Home Medications    Prior to Admission medications   Medication Sig Start Date End Date Taking? Authorizing Provider  acetaminophen (TYLENOL) 500 MG tablet Take 500 mg by mouth every 6 (six) hours as needed for headache (pain).    [provider]  amLODipine (NORVASC) 5 MG tablet Take 5 mg by mouth daily. 10/04/17   [provider]  atorvastatin (LIPITOR) 20 MG tablet Take 20 mg by mouth every evening. 02/01/18   [provider]  calcitRIOL (ROCALTROL) 0.25 MCG  capsule  04/25/18   [provider]  carvedilol (COREG) 25 MG tablet Take 25 mg by mouth 2 (two) times daily. 09/04/17   [provider]  Docusate Calcium (STOOL SOFTENER PO) Take 1 capsule by mouth daily as needed (constipation).    [provider]  folic acid (FOLVITE) 1 MG tablet Take 1 tablet (1 mg total) by mouth daily. 04/18/13   Robbie Lis, MD  furosemide (LASIX) 80 MG tablet Take 0.5 tablets (40 mg total) by mouth daily as needed for edema (Weight more than 2 pounds above 177 pounds). 04/02/17   Leana Gamer, MD  gabapentin (NEURONTIN) 300 MG capsule Take 300 mg by mouth at bedtime.  10/25/17   [provider]  insulin aspart (NOVOLOG FLEXPEN) 100 UNIT/ML FlexPen  Inject 2-6 Units into the skin 3 (three) times daily as needed for high blood sugar (CBG >150).    [provider]  omeprazole (PRILOSEC) 20 MG capsule Take 20 mg by mouth daily.     [provider]  ONGLYZA 5 MG TABS tablet  06/21/18   [provider]  polyethylene glycol powder (MIRALAX) powder Take 17 g by mouth daily. Patient taking differently: Take 17 g by mouth daily as needed (constipation). Mix in 8 oz liquid and drink 10/28/16   Palumbo, April, MD  Sennosides (SENNA LAXATIVE PO) Take 1 tablet by mouth daily as needed (constipation).    [provider]  VENTOLIN HFA 108 (90 Base) MCG/ACT inhaler Inhale 2 puffs into the lungs every 6 (six) hours as needed for wheezing.  09/03/17   [provider]    Family History Family History  Problem Relation Age of Onset  . Venous thrombosis Mother        Dec. of blood clot   . Diabetes type II Mother   . AAA (abdominal aortic aneurysm) Mother   . Throat cancer Father        Deceased of throat cancer    Social History Social History   Tobacco Use  . Smoking status: Former Smoker    Packs/day: 0.50    Years: 20.00    Pack years: 10.00    Quit date: 06/05/1966    Years since quitting: 52.6  . Smokeless tobacco: Never Used  Substance Use Topics  . Alcohol use: Yes    Comment: rare beer  . Drug use: Not Currently    Types: Cocaine    Comment: pt was clean for 2 years and started back recently.      Allergies   Morphine and related   Review of Systems Review of Systems  Constitutional: Positive for fatigue. Negative for chills and fever.  HENT: Negative for congestion and sore throat.   Eyes: Negative for visual disturbance.  Respiratory: Positive for shortness of breath (on exertion). Negative for cough.   Cardiovascular: Positive for leg swelling. Negative for chest pain.       + orthopnea  Gastrointestinal: Negative for abdominal pain, anal bleeding, blood in stool, constipation,  diarrhea, nausea and vomiting.  Endocrine: Negative for polydipsia and polyuria.  Genitourinary: Negative for difficulty urinating, dysuria and hematuria.  Musculoskeletal: Positive for back pain (chronic).  Neurological: Positive for weakness (generalized). Negative for dizziness, syncope, numbness and headaches.     Physical Exam Updated Vital Signs BP (!) 160/76   Pulse 75   Temp 98.9 F (37.2 C) (Oral)   Resp 17   SpO2 98%   Physical Exam Constitutional:  General: He is not in acute distress.    Appearance: Normal appearance. He is not ill-appearing.  HENT:     Head: Normocephalic and atraumatic.     Mouth/Throat:     Mouth: Mucous membranes are moist.  Eyes:     Extraocular Movements: Extraocular movements intact.     Pupils: Pupils are equal, round, and reactive to light.     Comments: Conjunctival pallor  Cardiovascular:     Rate and Rhythm: Normal rate and regular rhythm.     Heart sounds: No murmur.  Pulmonary:     Effort: Pulmonary effort is normal.     Breath sounds: Rales (bibasilar) present. No wheezing or rhonchi.  Abdominal:     General: Abdomen is flat.     Palpations: Abdomen is soft.     Tenderness: There is no abdominal tenderness.  Musculoskeletal:     Right lower leg: Edema (1+) present.     Left lower leg: Edema (1+) present.  Skin:    General: Skin is warm and dry.  Neurological:     General: No focal deficit present.     Mental Status: He is alert and oriented to person, place, and time.     Sensory: No sensory deficit.  Psychiatric:        Mood and Affect: Mood normal.        Behavior: Behavior normal.      ED Treatments / Results  Labs (all labs ordered are listed, but only abnormal results are displayed) Labs Reviewed  CBC WITH DIFFERENTIAL/PLATELET - Abnormal; Notable for the following components:      Result Value   WBC 23.9 (*)    RBC 1.96 (*)    Hemoglobin 3.8 (*)    HCT 12.4 (*)    MCV 63.3 (*)    MCH 19.4 (*)     RDW 21.9 (*)    Platelets 567 (*)    nRBC 0.9 (*)    Neutro Abs 19.8 (*)    Eosinophils Absolute 1.0 (*)    Basophils Absolute 0.2 (*)    nRBC 1 (*)    All other components within normal limits  COMPREHENSIVE METABOLIC PANEL - Abnormal; Notable for the following components:   Sodium 129 (*)    CO2 17 (*)    Glucose, Bld 171 (*)    BUN 34 (*)    Creatinine, Ser 3.60 (*)    Calcium 8.3 (*)    Albumin 2.4 (*)    AST 11 (*)    GFR calc non Af Amer 16 (*)    GFR calc Af Amer 19 (*)    All other components within normal limits  SARS CORONAVIRUS 2 (HOSPITAL ORDER, [facility])  BRAIN NATRIURETIC PEPTIDE  URINALYSIS, ROUTINE W REFLEX MICROSCOPIC  RAPID URINE DRUG SCREEN, HOSP PERFORMED  VITAMIN B12  FOLATE  IRON AND TIBC  FERRITIN  RETICULOCYTES  POC OCCULT BLOOD, ED  TYPE AND SCREEN  PREPARE RBC (CROSSMATCH)    EKG None  Radiology Dg Chest Port 1 View  Result Date: 01/16/2019 CLINICAL DATA:  Shortness of breath EXAM: PORTABLE CHEST 1 VIEW COMPARISON:  February 08, 2018. FINDINGS: There is mild cardiomegaly. There is prominence to the central pulmonary vasculature. No large airspace consolidation. Again noted are sclerosis of bilateral humeral heads. IMPRESSION: Cardiomegaly and pulmonary vascular congestion. Electronically Signed   By: Prudencio Pair M.D.   On: 01/16/2019 19:17    Procedures Procedures (including critical care time)  Medications Ordered in ED Medications  0.9 %  sodium chloride infusion (Manually program via Guardrails IV Fluids) (has no administration in time range)     Initial Impression / Assessment and Plan / ED Course  I have reviewed the triage vital signs and the nursing notes.  Pertinent labs & imaging results that were available during my care of the patient were reviewed by me and considered in my medical decision making (see chart for details).  [name] is a 68 yo male with PMH Sickle Cell Disease, DM, CKD 5,  GERD, HFrEF, polysubstance abuse, who presents with symptomatic anemia from nephrologist's office and progressive DOE, BLE edema, and orthopnea.  Patient is fluid overloaded on exam with bibasilar crackles and and likely with CHF exacerbation as well in the setting of noncompliance with Lasix in the last 2 weeks.  He has no infectious symptoms, has been afebrile and vitals are otherwise stable.  Hgb 3.8 and will require multiple units of blood, therefore will need admitted for symptomatic anemia.  Patient will also likely require diuresis as he has not been taking lasix and is fluid overloaded.  Will consult hospitalist for admission.  Spoke with [facility] hospitalist.  Will order anemia panel and FOBT.  FOBT performed with chaperone in room and given to [facility] lab for results.  Hospitalist, Dr. Myna Hidalgo, agrees to admit.   Final Clinical Impressions(s) / ED Diagnoses   Final diagnoses:  Symptomatic anemia    ED Discharge Orders    None       Cleophas Dunker, DO 01/16/19 1920    Drenda Freeze, MD 01/18/19 1747    Drenda Freeze, MD 01/18/19 [phone]

## 2019-01-17 DIAGNOSIS — Z9049 Acquired absence of other specified parts of digestive tract: Secondary | ICD-10-CM | POA: Diagnosis not present

## 2019-01-17 DIAGNOSIS — Z833 Family history of diabetes mellitus: Secondary | ICD-10-CM | POA: Diagnosis not present

## 2019-01-17 DIAGNOSIS — I5033 Acute on chronic diastolic (congestive) heart failure: Secondary | ICD-10-CM | POA: Diagnosis not present

## 2019-01-17 DIAGNOSIS — K219 Gastro-esophageal reflux disease without esophagitis: Secondary | ICD-10-CM | POA: Diagnosis present

## 2019-01-17 DIAGNOSIS — E871 Hypo-osmolality and hyponatremia: Secondary | ICD-10-CM | POA: Diagnosis present

## 2019-01-17 DIAGNOSIS — F1410 Cocaine abuse, uncomplicated: Secondary | ICD-10-CM | POA: Diagnosis present

## 2019-01-17 DIAGNOSIS — Z85038 Personal history of other malignant neoplasm of large intestine: Secondary | ICD-10-CM | POA: Diagnosis not present

## 2019-01-17 DIAGNOSIS — I132 Hypertensive heart and chronic kidney disease with heart failure and with stage 5 chronic kidney disease, or end stage renal disease: Secondary | ICD-10-CM | POA: Diagnosis present

## 2019-01-17 DIAGNOSIS — Z9114 Patient's other noncompliance with medication regimen: Secondary | ICD-10-CM | POA: Diagnosis not present

## 2019-01-17 DIAGNOSIS — Z885 Allergy status to narcotic agent status: Secondary | ICD-10-CM | POA: Diagnosis not present

## 2019-01-17 DIAGNOSIS — Z794 Long term (current) use of insulin: Secondary | ICD-10-CM | POA: Diagnosis not present

## 2019-01-17 DIAGNOSIS — I5030 Unspecified diastolic (congestive) heart failure: Secondary | ICD-10-CM

## 2019-01-17 DIAGNOSIS — D571 Sickle-cell disease without crisis: Secondary | ICD-10-CM | POA: Diagnosis present

## 2019-01-17 DIAGNOSIS — D649 Anemia, unspecified: Secondary | ICD-10-CM | POA: Diagnosis present

## 2019-01-17 DIAGNOSIS — D631 Anemia in chronic kidney disease: Secondary | ICD-10-CM | POA: Diagnosis present

## 2019-01-17 DIAGNOSIS — I5043 Acute on chronic combined systolic (congestive) and diastolic (congestive) heart failure: Secondary | ICD-10-CM | POA: Diagnosis present

## 2019-01-17 DIAGNOSIS — E1122 Type 2 diabetes mellitus with diabetic chronic kidney disease: Secondary | ICD-10-CM | POA: Diagnosis present

## 2019-01-17 DIAGNOSIS — I509 Heart failure, unspecified: Secondary | ICD-10-CM

## 2019-01-17 DIAGNOSIS — Z87891 Personal history of nicotine dependence: Secondary | ICD-10-CM | POA: Diagnosis not present

## 2019-01-17 DIAGNOSIS — Z79899 Other long term (current) drug therapy: Secondary | ICD-10-CM | POA: Diagnosis not present

## 2019-01-17 DIAGNOSIS — Z20828 Contact with and (suspected) exposure to other viral communicable diseases: Secondary | ICD-10-CM | POA: Diagnosis present

## 2019-01-17 DIAGNOSIS — N185 Chronic kidney disease, stage 5: Secondary | ICD-10-CM | POA: Diagnosis present

## 2019-01-17 LAB — BASIC METABOLIC PANEL
Anion gap: 8 (ref 5–15)
BUN: 34 mg/dL — ABNORMAL HIGH (ref 8–23)
CO2: 21 mmol/L — ABNORMAL LOW (ref 22–32)
Calcium: 8.4 mg/dL — ABNORMAL LOW (ref 8.9–10.3)
Chloride: 107 mmol/L (ref 98–111)
Creatinine, Ser: 3.41 mg/dL — ABNORMAL HIGH (ref 0.61–1.24)
GFR calc Af Amer: 20 mL/min — ABNORMAL LOW (ref >60)
GFR calc non Af Amer: 17 mL/min — ABNORMAL LOW (ref >60)
Glucose, Bld: 92 mg/dL (ref 70–99)
Potassium: 4.2 mmol/L (ref 3.5–5.1)
Sodium: 136 mmol/L (ref 135–145)

## 2019-01-17 LAB — FERRITIN: Ferritin: 878 ng/mL — ABNORMAL HIGH (ref 24–336)

## 2019-01-17 LAB — IRON AND TIBC
Iron: 18 ug/dL — ABNORMAL LOW (ref 45–182)
Saturation Ratios: 12 % — ABNORMAL LOW (ref 17.9–39.5)
TIBC: 148 ug/dL — ABNORMAL LOW (ref 250–450)
UIBC: 130 ug/dL

## 2019-01-17 LAB — RETICULOCYTES
Immature Retic Fract: 50.6 % — ABNORMAL HIGH (ref 2.3–15.9)
RBC.: 2.82 MIL/uL — ABNORMAL LOW (ref 4.22–5.81)
Retic Count, Absolute: 159.3 10*3/uL (ref 19.0–186.0)
Retic Ct Pct: 5.7 % — ABNORMAL HIGH (ref 0.4–3.1)

## 2019-01-17 LAB — GLUCOSE, CAPILLARY
Glucose-Capillary: 152 mg/dL — ABNORMAL HIGH (ref 70–99)
Glucose-Capillary: 73 mg/dL (ref 70–99)
Glucose-Capillary: 157 mg/dL — ABNORMAL HIGH (ref 70–99)
Glucose-Capillary: 114 mg/dL — ABNORMAL HIGH (ref 70–99)

## 2019-01-17 LAB — VITAMIN B12: Vitamin B-12: 381 pg/mL (ref 180–914)

## 2019-01-17 LAB — FOLATE: Folate: 27.3 ng/mL (ref >5.9)

## 2019-01-17 LAB — HEMOGLOBIN AND HEMATOCRIT, BLOOD
HCT: 19.5 % — ABNORMAL LOW (ref 39.0–52.0)
HCT: 24.2 % — ABNORMAL LOW (ref 39.0–52.0)
Hemoglobin: 6.9 g/dL — CL (ref 13.0–17.0)
Hemoglobin: 8.2 g/dL — ABNORMAL LOW (ref 13.0–17.0)

## 2019-01-17 LAB — MRSA PCR SCREENING: MRSA by PCR: NEGATIVE

## 2019-01-17 LAB — PREPARE RBC (CROSSMATCH)

## 2019-01-17 MED ORDER — GLUCERNA SHAKE PO LIQD
237.0000 mL | Freq: Three times a day (TID) | ORAL | Status: DC
  Administered 2019-01-17 (×2): 237 mL via ORAL

## 2019-01-17 MED ORDER — TRAMADOL HCL 50 MG PO TABS
50.0000 mg | ORAL_TABLET | Freq: Four times a day (QID) | ORAL | Status: DC | PRN
  Administered 2019-01-17 – 2019-01-18 (×3): 50 mg via ORAL
  Filled 2019-01-17 (×3): qty 1

## 2019-01-17 MED ORDER — SODIUM CHLORIDE 0.9% IV SOLUTION: Freq: Once | INTRAVENOUS | Status: AC

## 2019-01-17 MED ORDER — SODIUM CHLORIDE 0.9% IV SOLUTION
Freq: Once | INTRAVENOUS | Status: AC
  Administered 2019-01-17: 15:00:00 via INTRAVENOUS

## 2019-01-17 NOTE — Plan of Care (Signed)

## 2019-01-17 NOTE — Progress Notes (Signed)
New Admission Note: ? Arrival Method: via stretcher Mental Orientation: A/O x 4 Telemetry: Box #6 NSR Assessment: Completed Skin: Refer to flowsheet IV: RUA infusing Pain: none Tubes: Safety Measures: Safety Fall Prevention Plan discussed with patient. Admission: Completed [facility] [place] Orientation: Patient has been orientated to the room, unit and the staff. Family: Orders have been reviewed and are being implemented. Will continue to monitor the patient. Call light has been placed within reach and bed alarm has been activated.  ? [employer], [place]

## 2019-01-17 NOTE — Progress Notes (Signed)
Initial Nutrition Assessment  DOCUMENTATION CODES:   Not applicable  INTERVENTION:   - Glucerna Shake po TID, each supplement provides 220 kcal and 10 grams of protein  NUTRITION DIAGNOSIS:   Increased nutrient needs related to chronic illness (CHF, CKD 5) as evidenced by estimated needs.  GOAL:   Patient will meet greater than or equal to 90% of their needs  MONITOR:   PO intake, Supplement acceptance, Labs, Weight trends, I & O's  REASON FOR ASSESSMENT:   Malnutrition Screening Tool    ASSESSMENT:   68 year old male who presented to the ED on 8/13 with SOB and low hemoglobin. PMH of sickle cell disease, CKD 5 not on HD, T2DM, HTN, GERD, CHF, colon cancer, cocaine abuse.   Spoke with pt at bedside. Pt reports that he has had a poor appetite recently. When asked when that started, pt states, "it started when everything else started." Pt states that whenever he eats, he immediately has diarrhea. Pt states that this does not prevent him from eating.  Pt shares that he typically eats 2 meals daily. Pt states "it's different" when asked what he typically eats for a meal. Pt reports that he rarely snacks between meals.  When asked about his weight, pt states "it fluctuates" but is unable to provide a range. Pt states he knows "for a fact" that he has lost weight. He is unable to define the amount or timeframe.  Pt states that he drinks about 1 Glucerna shake daily.  RD reviewed weight history in chart. Pt with progressive weight loss since September 2019. Pt has lost 8.1 kg since 02/08/18. This is a 9.8% weight loss which is not significant for timeframe.  Pt states that he consumed 100% of his breakfast meal this morning and is willing to drink an oral nutrition supplement during admission.  Meal Completion: 100% x 2 meals  Medications reviewed and include: calcitriol, folic acid, Lasix, SSI, Protonix  Labs reviewed: sodium 129, hemoglobin 3.8 CBG's: 152, 109  UOP: 1150 ml  x 24 hours  NUTRITION - FOCUSED PHYSICAL EXAM:    Most Recent Value  Orbital Region  Mild depletion  Upper Arm Region  Mild depletion  Thoracic and Lumbar Region  No depletion  Buccal Region  No depletion  Temple Region  No depletion  Clavicle Bone Region  No depletion  Clavicle and Acromion Bone Region  Mild depletion  Scapular Bone Region  Unable to assess  Dorsal Hand  No depletion  Patellar Region  Unable to assess [pt refused]  Anterior Thigh Region  Unable to assess  Posterior Calf Region  Unable to assess  Edema (RD Assessment)  Unable to assess [pt refused]  Hair  Reviewed  Eyes  Reviewed  Mouth  Reviewed  Skin  Reviewed  Nails  Reviewed       Diet Order:   Diet Order            Diet heart healthy/carb modified Room service appropriate? Yes; Fluid consistency: Thin; Fluid restriction: 1500 mL Fluid  Diet effective now              EDUCATION NEEDS:   No education needs have been identified at this time  Skin:  Skin Assessment: Reviewed RN Assessment  Last BM:  01/16/19  Height:   Ht Readings from Last 1 Encounters:  01/16/19 6\' 1"  (1.854 m)    Weight:   Wt Readings from Last 1 Encounters:  01/16/19 75.5 kg    Ideal Body Weight:  83.6 kg  BMI:  Body mass index is 21.96 kg/m.  Estimated Nutritional Needs:   Kcal:  1900-2100  Protein:  90-105 grams  Fluid:  1.5 L    Gaynell Face, MS, RD, LDN Inpatient Clinical Dietitian Pager: [phone] Weekend/After Hours: [phone]

## 2019-01-17 NOTE — Progress Notes (Signed)
Patient having frequent missed beats lasting less than 2 seconds. Jeannette Corpus, NP notified. VSS. Will continue to monitor.

## 2019-01-17 NOTE — Progress Notes (Signed)
PROGRESS NOTE    [name]  NLG:[id number] DOB: 1951/03/18 DOA: 01/16/2019 PCP: Nolene Ebbs, MD   Brief Narrative: 68 year old male with history of sickle cell anemia, colon cancer, chronic kidney disease is stage V not on dialysis, hypertension, diabetes mellitus, cocaine abuse presented to the emergency department at the direction of patient's nephrologist.  Patient has been experiencing shortness of breath over the last 2 weeks which has been gradually getting worse.  Patient also noticed to have increased bilateral lower extremity edema.  Patient has not been taking Lasix in the last 2 weeks.  Denied having any melena or hematochezia, abdominal pain.  Work-up in the emergency department patient was found to have hemoglobin of 3.8, sodium 129, creatinine 3.6.  Patient received 3 units of packed RBC with improvement of the hemoglobin to 6.9.  Anemia work-up showed normal iron levels, low iron binding capacity, low B12 of 296.  Reticulocyte count is pending.  Assessment & Plan:  ##Symptomatic anemia from anemia of chronic kidney disease -Patient has normal iron levels, low iron binding capacity, low B12 -Replace vitamin B12 -Patient will benefit getting Epogen -Stool occult negative  ##Congestive heart failure diastolic acute on chronic -Continue with IV Lasix -Get echocardiogram  ##Chronic kidney disease stage V -Currently not on hemodialysis -Avoid nephrotoxic agents -Consult nephrology  ##Hypertension accelerated -Continue with the Norvasc, Coreg -Patient also has cocaine in the system, contributing to elevated blood pressure  ##Hyponatremia -Hypervolemic hyponatremia -Continue with diuresis and follow-up  ##Diabetes mellitus type 2 -Hemoglobin A1c was 5.6 in 2019 -On Onglyza and NovoLog -Follow-up on sliding scale insulin  ##Cocaine abuse -Urine drug screen is positive for cocaine -Counseling done -Patient seems to have poor insight  ##Sickle cell disease -No  current issues -Continue with the folate, B12       Principal Problem:   Symptomatic anemia Active Problems:   Hypertension   Type 2 diabetes mellitus with renal complication (HCC)   Cocaine abuse (HCC)   Hb-SS disease without crisis (HCC)   CKD (chronic kidney disease) stage 5, GFR less than 15 ml/min (HCC)   Acute on chronic diastolic CHF (congestive heart failure) (HCC)   Hyponatremia     DVT prophylaxis: SCDs Code Status: Full  family Communication: No family members present  disposition Plan: Home with home health in 1 to 2 days   Consultants:  Nephrology  Procedures: Echocardiogram   Subjective: Denies having any shortness of breath.  Patient states feels better from the time of the admission.  Objective: Vitals:   01/17/19 0307 01/17/19 0454 01/17/19 0646 01/17/19 0942  BP: (!) 130/59 (!) 137/59 128/60 (!) 142/62  Pulse: 72 73 72 69  Resp: 18 16 18 18   Temp: 98.8 F (37.1 C) 98.4 F (36.9 C) 98.2 F (36.8 C) 98.2 F (36.8 C)  TempSrc: Oral Oral Oral Oral  SpO2: 95% 97% 94% 99%  Weight:      Height:        Intake/Output Summary (Last 24 hours) at 01/17/2019 1331 Last data filed at 01/17/2019 1303 Gross per 24 hour  Intake 2298 ml  Output 1725 ml  Net 573 ml   Filed Weights   01/16/19 2241  Weight: 75.5 kg    Examination:  General exam: Appears calm and comfortable  Respiratory system: Clear to auscultation. Respiratory effort normal. Cardiovascular system: S1 & S2 heard, RRR. No JVD, murmurs, rubs, gallops or clicks. No pedal edema. Gastrointestinal system: Abdomen is nondistended, soft and nontender. No organomegaly or masses felt.  Normal bowel sounds heard. Central nervous system: Alert and oriented. No focal neurological deficits. Extremities: Symmetric 5 x 5 power. Skin: No rashes, lesions or ulcers Psychiatry: Judgement and insight appear normal. Mood & affect appropriate.     Data Reviewed: I have personally reviewed following  labs and imaging studies  CBC: Recent Labs  Lab 01/16/19 1820 01/17/19 0956  WBC 23.9*  --   NEUTROABS 19.8*  --   HGB 3.8* 6.9*  HCT 12.4* 19.5*  MCV 63.3*  --   PLT 567*  --    Basic Metabolic Panel: Recent Labs  Lab 01/16/19 1820 01/17/19 0956  NA 129* 136  K 4.2 4.2  CL 102 107  CO2 17* 21*  GLUCOSE 171* 92  BUN 34* 34*  CREATININE 3.60* 3.41*  CALCIUM 8.3* 8.4*   GFR: Estimated Creatinine Clearance: 21.5 mL/min (A) (by C-G formula based on SCr of 3.41 mg/dL (H)). Liver Function Tests: Recent Labs  Lab 01/16/19 1820  AST 11*  ALT 7  ALKPHOS 90  BILITOT 1.1  PROT 7.3  ALBUMIN 2.4*   No results for input(s): LIPASE, AMYLASE in the last 168 hours. No results for input(s): AMMONIA in the last 168 hours. Coagulation Profile: No results for input(s): INR, PROTIME in the last 168 hours. Cardiac Enzymes: No results for input(s): CKTOTAL, CKMB, CKMBINDEX, TROPONINI in the last 168 hours. BNP (last 3 results) No results for input(s): PROBNP in the last 8760 hours. HbA1C: No results for input(s): HGBA1C in the last 72 hours. CBG: Recent Labs  Lab 01/16/19 2212 01/17/19 0645 01/17/19 1126  GLUCAP 109* 152* 73   Lipid Profile: No results for input(s): CHOL, HDL, LDLCALC, TRIG, CHOLHDL, LDLDIRECT in the last 72 hours. Thyroid Function Tests: No results for input(s): TSH, T4TOTAL, FREET4, T3FREE, THYROIDAB in the last 72 hours. Anemia Panel: Recent Labs    01/17/19 0956  VITAMINB12 381  FOLATE 27.3  FERRITIN 878*  TIBC 148*  IRON 18*  RETICCTPCT 5.7*   Sepsis Labs: No results for input(s): PROCALCITON, LATICACIDVEN in the last 168 hours.  Recent Results (from the past 240 hour(s))  SARS Coronavirus 2 [facility] order, Performed in [facility] hospital lab) Nasopharyngeal Nasopharyngeal Swab     Status: None   Collection Time: 01/16/19  7:06 PM   Specimen: Nasopharyngeal Swab  Result Value Ref Range Status   SARS Coronavirus 2 NEGATIVE NEGATIVE  Final    Comment: (NOTE) If result is NEGATIVE SARS-CoV-2 target nucleic acids are NOT DETECTED. The SARS-CoV-2 RNA is generally detectable in upper and lower  respiratory specimens during the acute phase of infection. The lowest  concentration of SARS-CoV-2 viral copies this assay can detect is 250  copies / mL. A negative result does not preclude SARS-CoV-2 infection  and should not be used as the sole basis for treatment or other  patient management decisions.  A negative result may occur with  improper specimen collection / handling, submission of specimen other  than nasopharyngeal swab, presence of viral mutation(s) within the  areas targeted by this assay, and inadequate number of viral copies  (<250 copies / mL). A negative result must be combined with clinical  observations, patient history, and epidemiological information. If result is POSITIVE SARS-CoV-2 target nucleic acids are DETECTED. The SARS-CoV-2 RNA is generally detectable in upper and lower  respiratory specimens dur ing the acute phase of infection.  Positive  results are indicative of active infection with SARS-CoV-2.  Clinical  correlation with patient history and other  diagnostic information is  necessary to determine patient infection status.  Positive results do  not rule out bacterial infection or co-infection with other viruses. If result is PRESUMPTIVE POSTIVE SARS-CoV-2 nucleic acids MAY BE PRESENT.   A presumptive positive result was obtained on the submitted specimen  and confirmed on repeat testing.  While 2019 novel coronavirus  (SARS-CoV-2) nucleic acids may be present in the submitted sample  additional confirmatory testing may be necessary for epidemiological  and / or clinical management purposes  to differentiate between  SARS-CoV-2 and other Sarbecovirus currently known to infect humans.  If clinically indicated additional testing with an alternate test  methodology [phone]) is advised. The  SARS-CoV-2 RNA is generally  detectable in upper and lower respiratory sp ecimens during the acute  phase of infection. The expected result is Negative. Fact Sheet for Patients:  StrictlyIdeas.no Fact Sheet for Healthcare Providers: BankingDealers.co.za This test is not yet approved or cleared by the [place] FDA and has been authorized for detection and/or diagnosis of SARS-CoV-2 by FDA under an Emergency Use Authorization (EUA).  This EUA will remain in effect (meaning this test can be used) for the duration of the COVID-19 declaration under Section 564(b)(1) of the Act, 21 U.S.C. section 360bbb-3(b)(1), unless the authorization is terminated or revoked sooner. Performed at [facility], [place] [address], [place], [place] [address]   MRSA PCR Screening     Status: None   Collection Time: 01/16/19 10:12 PM   Specimen: Nasal Mucosa; Nasopharyngeal  Result Value Ref Range Status   MRSA by PCR NEGATIVE NEGATIVE Final    Comment:        The GeneXpert MRSA Assay (FDA approved for NASAL specimens only), is one component of a comprehensive MRSA colonization surveillance program. It is not intended to diagnose MRSA infection nor to guide or monitor treatment for MRSA infections. Performed at [facility], [place] [address] [place],  [address]          Radiology Studies: Dg Chest Port 1 View  Result Date: 01/16/2019 CLINICAL DATA:  Shortness of breath EXAM: PORTABLE CHEST 1 VIEW COMPARISON:  February 08, 2018. FINDINGS: There is mild cardiomegaly. There is prominence to the central pulmonary vasculature. No large airspace consolidation. Again noted are sclerosis of bilateral humeral heads. IMPRESSION: Cardiomegaly and pulmonary vascular congestion. Electronically Signed   By: Prudencio Pair M.D.   On: 01/16/2019 19:17        Scheduled Meds: . sodium chloride   Intravenous Once  . sodium chloride   Intravenous  Once  . amLODipine  5 mg Oral Daily  . atorvastatin  20 mg Oral q1800  . calcitRIOL  0.25 mcg Oral Daily  . carvedilol  25 mg Oral BID  . feeding supplement (GLUCERNA SHAKE)  237 mL Oral TID BM  . folic acid  1 mg Oral Daily  . furosemide  40 mg Intravenous Q12H  . gabapentin  300 mg Oral Daily  . insulin aspart  0-5 Units Subcutaneous QHS  . insulin aspart  0-9 Units Subcutaneous TID WC  . pantoprazole  40 mg Oral Daily  . sodium chloride flush  3 mL Intravenous Q12H   Continuous Infusions: . sodium chloride       LOS: 0 days    Time spent: 35 minutes   Rosaline Ezekiel, MD [facility] Pager [phone]  If 7PM-7AM, please contact night-coverage [email or web address] Password [email or web address] 01/17/2019, 1:31 PM

## 2019-01-18 LAB — BASIC METABOLIC PANEL
Anion gap: 10 (ref 5–15)
BUN: 36 mg/dL — ABNORMAL HIGH (ref 8–23)
CO2: 23 mmol/L (ref 22–32)
Calcium: 8.8 mg/dL — ABNORMAL LOW (ref 8.9–10.3)
Chloride: 102 mmol/L (ref 98–111)
Creatinine, Ser: 3.35 mg/dL — ABNORMAL HIGH (ref 0.61–1.24)
GFR calc Af Amer: 20 mL/min — ABNORMAL LOW (ref >60)
GFR calc non Af Amer: 18 mL/min — ABNORMAL LOW (ref >60)
Glucose, Bld: 115 mg/dL — ABNORMAL HIGH (ref 70–99)
Potassium: 4.3 mmol/L (ref 3.5–5.1)
Sodium: 135 mmol/L (ref 135–145)

## 2019-01-18 LAB — GLUCOSE, CAPILLARY
Glucose-Capillary: 114 mg/dL — ABNORMAL HIGH (ref 70–99)
Glucose-Capillary: 148 mg/dL — ABNORMAL HIGH (ref 70–99)
Glucose-Capillary: 109 mg/dL — ABNORMAL HIGH (ref 70–99)

## 2019-01-18 LAB — TYPE AND SCREEN
ABO/RH(D): AB POS
Antibody Screen: NEGATIVE
Unit division: 0
Unit division: 0
Unit division: 0
Unit division: 0

## 2019-01-18 LAB — BPAM RBC
Blood Product Expiration Date: 202008142359
Blood Product Expiration Date: 202008212359
Blood Product Expiration Date: 202008212359
Blood Product Expiration Date: 202008222359
ISSUE DATE / TIME: 202008131935
ISSUE DATE / TIME: 202008132321
ISSUE DATE / TIME: 202008140247
ISSUE DATE / TIME: 202008141422
Unit Type and Rh: 1700
Unit Type and Rh: 8400
Unit Type and Rh: 8400
Unit Type and Rh: 8400

## 2019-01-18 LAB — HIV ANTIBODY (ROUTINE TESTING W REFLEX): HIV Screen 4th Generation wRfx: NONREACTIVE

## 2019-01-18 MED ORDER — GABAPENTIN 300 MG PO CAPS: 300.0000 mg | ORAL_CAPSULE | Freq: Every day | ORAL | 1 refills | Status: DC

## 2019-01-18 MED ORDER — FERROUS SULFATE 325 (65 FE) MG PO TABS: 325.0000 mg | ORAL_TABLET | Freq: Two times a day (BID) | ORAL | 3 refills | Status: DC

## 2019-01-18 MED ORDER — SODIUM CHLORIDE 0.9 % IV SOLN
125.0000 mg | Freq: Once | INTRAVENOUS | Status: AC
  Administered 2019-01-18: 125 mg via INTRAVENOUS
  Filled 2019-01-18: qty 10

## 2019-01-18 MED ORDER — DARBEPOETIN ALFA 100 MCG/0.5ML IJ SOSY
100.0000 ug | PREFILLED_SYRINGE | INTRAMUSCULAR | Status: DC
  Administered 2019-01-18: 100 ug via SUBCUTANEOUS
  Filled 2019-01-18 (×2): qty 0.5

## 2019-01-18 NOTE — Progress Notes (Signed)
DISCHARGE NOTE HOME [name] to be discharged Home per MD order. Discussed prescriptions and follow up appointments with the patient. Prescriptions given to patient; medication list explained in detail. Patient verbalized understanding.  Skin clean, dry and intact without evidence of skin break down, no evidence of skin tears noted. IV catheter discontinued intact. Site without signs and symptoms of complications. Dressing and pressure applied. Pt denies pain at the site currently. No complaints noted.  Patient free of lines, drains, and wounds.   An After Visit Summary (AVS) was printed and given to the patient. Patient escorted via wheelchair, and discharged home via private auto.  Aneta Mins BSN, RN3

## 2019-01-18 NOTE — Discharge Summary (Signed)
Physician Discharge Summary  [name] CXK:[id number] DOB: 1951/04/09 DOA: 01/16/2019  PCP: Nolene Ebbs, MD  Admit date: 01/16/2019 Discharge date: 01/18/2019  Time spent: 40 minutes  Recommendations for Outpatient Follow-up:  1. Follow-up with the primary care physician in 1 week 2. Discharged with home health with physical therapy   Discharge Diagnoses:  Principal Problem:   Symptomatic anemia Active Problems:   Hypertension   Type 2 diabetes mellitus with renal complication (HCC)   Cocaine abuse (HCC)   Hb-SS disease without crisis (HCC)   CKD (chronic kidney disease) stage 5, GFR less than 15 ml/min (HCC)   Acute on chronic diastolic CHF (congestive heart failure) (HCC)   Hyponatremia   Congestive heart failure (CHF) ([place])   Discharge Condition: Stable  Diet recommendation: Renal  Filed Weights   01/16/19 2241  Weight: 75.5 kg    History of present illness and Hospital Course:  68 year old male with history of sickle cell anemia, colon cancer, chronic kidney disease is stage V not on dialysis, hypertension, diabetes mellitus, cocaine abuse presented to the emergency department at the direction of patient's nephrologist.  Patient has been experiencing shortness of breath over the last 2 weeks which has been gradually getting worse.  Patient also noticed to have increased bilateral lower extremity edema.  Patient has not been taking Lasix in the last 2 weeks.  Denied having any melena or hematochezia, abdominal pain.  Work-up in the emergency department patient was found to have hemoglobin of 3.8, sodium 129, creatinine 3.6.  Patient received 3 units of packed RBC with improvement of the hemoglobin to 6.9.  Anemia work-up showed low iron levels, low iron binding capacity, low B12 of 346. Reticulocyte count is pending.  Patient did received a total of 4 units of packed RBC.  Patient had significant improvement with the shortness of breath with IV diuresis.  Discharged with  p.o. Lasix.  Patient is on room air having good oxygen saturations.  Patient is able to walk with the help of the walker to the bathroom.  Patient is given Epogen, IV iron.  Recommended patient to follow-up with the primary care physician in 1 to 2 weeks.  Also recommended patient to follow-up with nephrology as an outpatient.  Assessment & Plan:  ##Symptomatic anemia from anemia of chronic kidney disease -Patient has normal iron levels, low iron binding capacity, low B12 -Patient is given IV iron, Epogen -Stool occult negative  ##Congestive heart failure diastolic acute on chronic -Continue with IV Lasix -Get echocardiogram 2018 showed EF of greater than 60%  ##Chronic kidney disease stage V -Currently not on hemodialysis -Avoid nephrotoxic agents -Consult nephrology  ##Hypertension accelerated -Continue with the Norvasc, Coreg -Patient also has cocaine in the system, contributing to elevated blood pressure  ##Hyponatremia -Hypervolemic hyponatremia -Continue with diuresis and follow-up -Resolved  ##Diabetes mellitus type 2 -Hemoglobin A1c was 5.6 in 2019 -On Onglyza and NovoLog -Follow-up on sliding scale insulin  ##Cocaine abuse -Urine drug screen is positive for cocaine -Counseling done, expressed understanding   ##Sickle cell disease -No current issues -Continue with the folate, B12     Discharge Exam: Vitals:   01/18/19 0505 01/18/19 1046  BP: (!) 144/67 (!) 146/68  Pulse: 66 76  Resp: 16 18  Temp: 98.5 F (36.9 C) 98.5 F (36.9 C)  SpO2: 93% 96%    General exam: Appears calm and comfortable  Respiratory system: Clear to auscultation. Respiratory effort normal. Cardiovascular system: S1 & S2 heard, RRR. No JVD, murmurs, rubs,  gallops or clicks. No pedal edema. Gastrointestinal system: Abdomen is nondistended, soft and nontender. No organomegaly or masses felt. Normal bowel sounds heard. Central nervous system: Alert and oriented. No focal  neurological deficits. Extremities: Symmetric 5 x 5 power. Skin: No rashes, lesions or ulcers Psychiatry: Judgement and insight appear normal. Mood & affect appropriate.  Discharge Instructions   Discharge Instructions    Diet - low sodium heart healthy   Complete by: As directed    Increase activity slowly   Complete by: As directed      Allergies as of 01/18/2019      Reactions   Morphine And Related Itching      Medication List    TAKE these medications   acetaminophen 500 MG tablet Commonly known as: TYLENOL Take 500 mg by mouth every 6 (six) hours as needed for headache (pain).   amLODipine 5 MG tablet Commonly known as: NORVASC Take 5 mg by mouth daily.   atorvastatin 20 MG tablet Commonly known as: LIPITOR Take 20 mg by mouth every evening.   calcitRIOL 0.25 MCG capsule Commonly known as: ROCALTROL Take 0.25 mcg by mouth daily.   carvedilol 25 MG tablet Commonly known as: COREG Take 25 mg by mouth 2 (two) times daily.   ferrous sulfate 325 (65 FE) MG tablet Commonly known as: Feosol Take 1 tablet (325 mg total) by mouth 2 (two) times daily with a meal.   folic acid 1 MG tablet Commonly known as: FOLVITE Take 1 tablet (1 mg total) by mouth daily.   furosemide 80 MG tablet Commonly known as: LASIX Take 0.5 tablets (40 mg total) by mouth daily as needed for edema (Weight more than 2 pounds above 177 pounds). What changed:   when to take this  reasons to take this   gabapentin 300 MG capsule Commonly known as: NEURONTIN Take 1 capsule (300 mg total) by mouth at bedtime. What changed: when to take this   NovoLOG FlexPen 100 UNIT/ML FlexPen Generic drug: insulin aspart Inject 2-6 Units into the skin 3 (three) times daily as needed for high blood sugar (CBG >150).   omeprazole 20 MG capsule Commonly known as: PRILOSEC Take 20 mg by mouth daily.   Onglyza 5 MG Tabs tablet Generic drug: saxagliptin HCl Take 5 mg by mouth daily.   polyethylene  glycol powder 17 GM/SCOOP powder Commonly known as: MiraLax Take 17 g by mouth daily. What changed:   when to take this  reasons to take this  additional instructions   SENNA LAXATIVE PO Take 1 tablet by mouth daily as needed (constipation).   STOOL SOFTENER PO Take 1 capsule by mouth daily as needed (constipation).   Ventolin HFA 108 (90 Base) MCG/ACT inhaler Generic drug: albuterol Inhale 2 puffs into the lungs every 6 (six) hours as needed for wheezing.      Allergies  Allergen Reactions  . Morphine And Related Itching      The results of significant diagnostics from this hospitalization (including imaging, microbiology, ancillary and laboratory) are listed below for reference.    Significant Diagnostic Studies: Dg Chest Port 1 View  Result Date: 01/16/2019 CLINICAL DATA:  Shortness of breath EXAM: PORTABLE CHEST 1 VIEW COMPARISON:  February 08, 2018. FINDINGS: There is mild cardiomegaly. There is prominence to the central pulmonary vasculature. No large airspace consolidation. Again noted are sclerosis of bilateral humeral heads. IMPRESSION: Cardiomegaly and pulmonary vascular congestion. Electronically Signed   By: Prudencio Pair M.D.   On: 01/16/2019 19:17  Microbiology: Recent Results (from the past 240 hour(s))  SARS Coronavirus 2 [facility] order, Performed in [facility] hospital lab) Nasopharyngeal Nasopharyngeal Swab     Status: None   Collection Time: 01/16/19  7:06 PM   Specimen: Nasopharyngeal Swab  Result Value Ref Range Status   SARS Coronavirus 2 NEGATIVE NEGATIVE Final    Comment: (NOTE) If result is NEGATIVE SARS-CoV-2 target nucleic acids are NOT DETECTED. The SARS-CoV-2 RNA is generally detectable in upper and lower  respiratory specimens during the acute phase of infection. The lowest  concentration of SARS-CoV-2 viral copies this assay can detect is 250  copies / mL. A negative result does not preclude SARS-CoV-2 infection  and should not be  used as the sole basis for treatment or other  patient management decisions.  A negative result may occur with  improper specimen collection / handling, submission of specimen other  than nasopharyngeal swab, presence of viral mutation(s) within the  areas targeted by this assay, and inadequate number of viral copies  (<250 copies / mL). A negative result must be combined with clinical  observations, patient history, and epidemiological information. If result is POSITIVE SARS-CoV-2 target nucleic acids are DETECTED. The SARS-CoV-2 RNA is generally detectable in upper and lower  respiratory specimens dur ing the acute phase of infection.  Positive  results are indicative of active infection with SARS-CoV-2.  Clinical  correlation with patient history and other diagnostic information is  necessary to determine patient infection status.  Positive results do  not rule out bacterial infection or co-infection with other viruses. If result is PRESUMPTIVE POSTIVE SARS-CoV-2 nucleic acids MAY BE PRESENT.   A presumptive positive result was obtained on the submitted specimen  and confirmed on repeat testing.  While 2019 novel coronavirus  (SARS-CoV-2) nucleic acids may be present in the submitted sample  additional confirmatory testing may be necessary for epidemiological  and / or clinical management purposes  to differentiate between  SARS-CoV-2 and other Sarbecovirus currently known to infect humans.  If clinically indicated additional testing with an alternate test  methodology [phone]) is advised. The SARS-CoV-2 RNA is generally  detectable in upper and lower respiratory sp ecimens during the acute  phase of infection. The expected result is Negative. Fact Sheet for Patients:  StrictlyIdeas.no Fact Sheet for Healthcare Providers: BankingDealers.co.za This test is not yet approved or cleared by the [place] FDA and has been authorized  for detection and/or diagnosis of SARS-CoV-2 by FDA under an Emergency Use Authorization (EUA).  This EUA will remain in effect (meaning this test can be used) for the duration of the COVID-19 declaration under Section 564(b)(1) of the Act, 21 U.S.C. section 360bbb-3(b)(1), unless the authorization is terminated or revoked sooner. Performed at [facility], [place] [address] [place], [place] [address]   MRSA PCR Screening     Status: None   Collection Time: 01/16/19 10:12 PM   Specimen: Nasal Mucosa; Nasopharyngeal  Result Value Ref Range Status   MRSA by PCR NEGATIVE NEGATIVE Final    Comment:        The GeneXpert MRSA Assay (FDA approved for NASAL specimens only), is one component of a comprehensive MRSA colonization surveillance program. It is not intended to diagnose MRSA infection nor to guide or monitor treatment for MRSA infections. Performed at [facility], [place] [address], [place], [place] [address]      Labs: Basic Metabolic Panel: Recent Labs  Lab 01/16/19 1820 01/17/19 8413 01/18/19 0534  NA 129* 136 135  K 4.2 4.2 4.3  CL 102 107 102  CO2 17* 21* 23  GLUCOSE 171* 92 115*  BUN 34* 34* 36*  CREATININE 3.60* 3.41* 3.35*  CALCIUM 8.3* 8.4* 8.8*   Liver Function Tests: Recent Labs  Lab 01/16/19 1820  AST 11*  ALT 7  ALKPHOS 90  BILITOT 1.1  PROT 7.3  ALBUMIN 2.4*   No results for input(s): LIPASE, AMYLASE in the last 168 hours. No results for input(s): AMMONIA in the last 168 hours. CBC: Recent Labs  Lab 01/16/19 1820 01/17/19 0956 01/17/19 2025  WBC 23.9*  --   --   NEUTROABS 19.8*  --   --   HGB 3.8* 6.9* 8.2*  HCT 12.4* 19.5* 24.2*  MCV 63.3*  --   --   PLT 567*  --   --    Cardiac Enzymes: No results for input(s): CKTOTAL, CKMB, CKMBINDEX, TROPONINI in the last 168 hours. BNP: BNP (last 3 results) Recent Labs    01/16/19 1820  BNP 1,036.2*    ProBNP (last 3 results) No results for input(s): PROBNP in the last 8760  hours.  CBG: Recent Labs  Lab 01/17/19 1126 01/17/19 1645 01/17/19 2129 01/18/19 0643 01/18/19 1128  GLUCAP 73 157* 114* 114* 148*       Signed:  Kalev Temme MD.  [facility] 01/18/2019, 1:27 PM

## 2019-01-18 NOTE — TOC Transition Note (Signed)
Transition of Care [facility]) - CM/SW Discharge Note   Patient Details  Name: [name] MRN: [id number] Date of Birth: 1950/10/28  [facility] CM/SW Contact:  Carles Collet, RN Phone Number: 01/18/2019, 11:15 AM   Clinical Narrative:   Spoke with patient. He states that he lives at home alone. He has a friend that helps him who will be able to get at the hospital today. He states that his MD is 1/2 a block from him home. He has a cane. He denies need for home support and declines HH services or additional DME. No other CM needs identified.     Final next level of care: Home/Self Care Barriers to Discharge: No Barriers Identified   Patient Goals and CMS Choice        Discharge Placement                       Discharge Plan and Services                                     Social Determinants of Health (SDOH) Interventions     Readmission Risk Interventions No flowsheet data found.

## 2019-01-18 NOTE — Evaluation (Signed)
Physical Therapy Evaluation Patient Details Name: [name] MRN: [id number] DOB: Nov 06, 1950 Today's Date: 01/18/2019   History of Present Illness  Patient is a 68 y/o male presenting with Symptomatic anemia from anemia of chronic kidney disease - patient received PRBCs. PMH significant of sickle cell anemia, colon cancer, chronic kidney disease is stage V not on dialysis, hypertension, diabetes mellitus, cocaine abuse.     Clinical Impression  Patient admitted with the above. Patient reports Mod I at baseline for mobility and ADLs with use of SPC. Patient today requiring general min guard with OOB mobility for safety - no LOB or overt instability. Slow gait speed - likely baseline. Do not anticipate patient to need PT services at discharge. PT to follow acutely.     Follow Up Recommendations No PT follow up;Supervision - Intermittent    Equipment Recommendations  None recommended by PT    Recommendations for Other Services       Precautions / Restrictions Precautions Precautions: Fall Restrictions Weight Bearing Restrictions: No      Mobility  Bed Mobility Overal bed mobility: Modified Independent                Transfers Overall transfer level: Needs assistance Equipment used: Rolling walker (2 wheeled) Transfers: Sit to/from Stand Sit to Stand: Min guard            Ambulation/Gait Ambulation/Gait assistance: Min guard Gait Distance (Feet): 200 Feet Assistive device: Rolling walker (2 wheeled) Gait Pattern/deviations: Step-through pattern;Decreased stride length;Trunk flexed Gait velocity: decreased   General Gait Details: sloe steady pace of gait; no LOB or overt instability   Stairs            Wheelchair Mobility    Modified Rankin (Stroke Patients Only)       Balance Overall balance assessment: Mild deficits observed, not formally tested                                           Pertinent Vitals/Pain Pain  Assessment: No/denies pain    Home Living Family/patient expects to be discharged to:: Private residence Living Arrangements: Alone Available Help at Discharge: Family;Friend(s);Available PRN/intermittently Type of Home: Apartment Home Access: Level entry     Home Layout: One level Home Equipment: Cane - single point;Walker - 2 wheels;Walker - 4 wheels      Prior Function Level of Independence: Independent with assistive device(s)         Comments: ex wife does the grocery shopping, doesn't drive, can dress and bath     Hand Dominance   Dominant Hand: Right    Extremity/Trunk Assessment   Upper Extremity Assessment Upper Extremity Assessment: Defer to OT evaluation    Lower Extremity Assessment Lower Extremity Assessment: Overall WFL for tasks assessed    Cervical / Trunk Assessment Cervical / Trunk Assessment: Kyphotic  Communication   Communication: No difficulties  Cognition Arousal/Alertness: Awake/alert Behavior During Therapy: WFL for tasks assessed/performed Overall Cognitive Status: Within Functional Limits for tasks assessed                                        General Comments      Exercises     Assessment/Plan    PT Assessment Patient needs continued PT services  PT Problem List Decreased strength;Decreased  balance;Decreased activity tolerance;Decreased mobility;Decreased knowledge of use of DME;Decreased safety awareness       PT Treatment Interventions DME instruction;Gait training;Functional mobility training;Therapeutic activities;Therapeutic exercise;Balance training;Patient/family education    PT Goals (Current goals can be found in the Care Plan section)  Acute Rehab PT Goals Patient Stated Goal: return home soon PT Goal Formulation: With patient Time For Goal Achievement: 02/01/19 Potential to Achieve Goals: Good    Frequency Min 3X/week   Barriers to discharge        Co-evaluation                AM-PAC PT "6 Clicks" Mobility  Outcome Measure Help needed turning from your back to your side while in a flat bed without using bedrails?: A Little Help needed moving from lying on your back to sitting on the side of a flat bed without using bedrails?: A Little Help needed moving to and from a bed to a chair (including a wheelchair)?: A Little Help needed standing up from a chair using your arms (e.g., wheelchair or bedside chair)?: A Little Help needed to walk in hospital room?: A Little Help needed climbing 3-5 steps with a railing? : A Little 6 Click Score: 18    End of Session Equipment Utilized During Treatment: Gait belt Activity Tolerance: Patient tolerated treatment well Patient left: in bed;with call bell/phone within reach Nurse Communication: Mobility status PT Visit Diagnosis: Unsteadiness on feet (R26.81);Other abnormalities of gait and mobility (R26.89);Muscle weakness (generalized) (M62.81)    Time: 3838-1840 PT Time Calculation (min) (ACUTE ONLY): 14 min   Charges:   PT Evaluation $PT Eval Moderate Complexity: 1 Mod          Lanney Gins, PT, DPT Supplemental Physical Therapist 01/18/19 10:17 AM Pager: [phone] Office: [phone]

## 2019-02-28 ENCOUNTER — Other Ambulatory Visit: Payer: Self-pay

## 2019-02-28 DIAGNOSIS — N185 Chronic kidney disease, stage 5: Secondary | ICD-10-CM

## 2019-03-03 ENCOUNTER — Other Ambulatory Visit (HOSPITAL_COMMUNITY): Payer: Medicare HMO

## 2019-03-03 ENCOUNTER — Encounter: Payer: Medicare HMO | Admitting: Surgery

## 2019-03-03 ENCOUNTER — Encounter (HOSPITAL_COMMUNITY): Payer: Medicare HMO

## 2019-03-10 ENCOUNTER — Other Ambulatory Visit (HOSPITAL_COMMUNITY)
Admission: RE | Admit: 2019-03-10 | Discharge: 2019-03-10 | Disposition: A | Discharge status: Home / Self Care | Payer: Medicare HMO | Source: Ambulatory Visit | Attending: Otolaryngology | Admitting: Otolaryngology

## 2019-03-10 ENCOUNTER — Encounter (HOSPITAL_COMMUNITY): Payer: Self-pay | Admitting: *Deleted

## 2019-03-10 ENCOUNTER — Other Ambulatory Visit: Payer: Self-pay

## 2019-03-10 DIAGNOSIS — R634 Abnormal weight loss: Secondary | ICD-10-CM | POA: Insufficient documentation

## 2019-03-10 DIAGNOSIS — Z20828 Contact with and (suspected) exposure to other viral communicable diseases: Secondary | ICD-10-CM | POA: Insufficient documentation

## 2019-03-10 DIAGNOSIS — R109 Unspecified abdominal pain: Secondary | ICD-10-CM | POA: Diagnosis not present

## 2019-03-10 DIAGNOSIS — Z01812 Encounter for preprocedural laboratory examination: Secondary | ICD-10-CM | POA: Insufficient documentation

## 2019-03-10 DIAGNOSIS — R935 Abnormal findings on diagnostic imaging of other abdominal regions, including retroperitoneum: Secondary | ICD-10-CM | POA: Diagnosis not present

## 2019-03-10 DIAGNOSIS — D649 Anemia, unspecified: Secondary | ICD-10-CM | POA: Diagnosis not present

## 2019-03-10 DIAGNOSIS — Z86010 Personal history of colonic polyps: Secondary | ICD-10-CM | POA: Diagnosis not present

## 2019-03-10 LAB — SARS CORONAVIRUS 2 (TAT 6-24 HRS): SARS Coronavirus 2: NEGATIVE

## 2019-03-10 NOTE — Progress Notes (Signed)
Patient denies shortness of breath, fever, cough and chest pain.  PCP - Dr Nolene Ebbs Cardiologist - Dr Lyman Bishop Nephrologist -  Foye Clock, PA-C @ [facility]  Chest x-ray - 01/16/19- 1 view EKG - 01/16/19 Stress Test - 2017 ECHO - 03/27/17 Cardiac Cath - Denies  Fasting Blood Sugar -  150-170s Checks Blood Sugar_3 times a day  . Do not take oral diabetes medicines (onglyza) the morning of surgery.  THE MORNING OF SURGERY . If your CBG is greater than 220 mg/dL, you may take  of your sliding scale (correction) dose of novolog insulin.  o Check your blood sugar the morning of your surgery when you wake up and every 2 hours until you get to the Short Stay unit. . If your blood sugar is less than 70 mg/dL, you will need to treat for low blood sugar: o Treat a low blood sugar (less than 70 mg/dL) with  cup of clear juice (cranberry or apple),  o Recheck blood sugar in 15 minutes after treatment (to make sure it is greater than 70 mg/dL). If your blood sugar is not greater than 70 mg/dL on recheck, call [phone] for further instructions.  Anesthesia review: Yes  STOP now taking any Aspirin (unless otherwise instructed by your surgeon), Aleve, Naproxen, Ibuprofen, Motrin, Advil, Goody's, BC's, all herbal medications, fish oil, and all vitamins.   Coronavirus Screening Have you experienced the following symptoms:  Cough yes/no: No Fever (>100.36F)  yes/no: No Runny nose yes/no: No Sore throat yes/no: No Difficulty breathing/shortness of breath  yes/no: No  Have you traveled in the last 14 days and where? yes/no: No  Patient verbalized understanding of instructions that were given to him via phone.

## 2019-03-11 NOTE — Progress Notes (Signed)
Anesthesia Chart Review: Same day workup  Seen by cardiology, Dr. Debara Pickett, 07/09/18 for preop clearance prior to AV fistula placement (pt ultimately did not have this surgery). Per Dr. Lysbeth Penner note "Mr. Mczeal had a low risk stress test in 2017 which showed a fixed defect likely related to left bundle branch block.  No reversible ischemia was noted.  In 2018 he had normal systolic function on echo.  He has no new symptoms concerning for ischemia.  I think he is acceptable risk for AV fistula placement and should start dialysis as necessary.  Plan follow-up with me annually or sooner as necessary."  He is followed by [facility] for CKD 5. He was last seen 01/16/19. Per OV note the pt was fluid overloaded at that time due to running out of Lasix. The pt also admitted to using cocaine again recently. His creatinine at that time was 3.44. His hemoglobin was found to be 4.1 and he was advised to be seen at the ED. He was ultimately admitted 8/13-8/15/20 and treated for symptomatic anemia and fluid overload. He received a total of 4 units PRBC. His SOB and edema improved with diuresis. His hemoglobin improved to 8.2. His creatinine was stable, 3.35 at discharge.   Per Dr. Redmond Baseman' office, pt was cleared for surgery by PCP Dr. Jeanie Cooks after recent hospitalization. They are faxing clearance.   TTE 03/27/17:  - Left ventricle: The cavity size was normal. There was moderate   concentric hypertrophy. Systolic function was normal. The   estimated ejection fraction was in the range of 55% to 60%. Wall   motion was normal; there were no regional wall motion   abnormalities. There was an increased relative contribution of   atrial contraction to ventricular filling. Doppler parameters are   consistent with abnormal left ventricular relaxation (grade 1   diastolic dysfunction). Doppler parameters are consistent with   high ventricular filling pressure. - Aortic valve: Valve area (Vmax): 2.69 cm^2. - Mitral valve:  Calcified annulus. There was mild regurgitation. - Right ventricle: The cavity size was moderately dilated. Wall   thickness was normal. - Right atrium: The atrium was mildly dilated.     Wynonia Musty [facility] Short Stay Center/Anesthesiology Phone [phone] 03/11/2019 4:11 PM

## 2019-03-11 NOTE — Anesthesia Preprocedure Evaluation (Addendum)
Anesthesia Evaluation  Patient identified by MRN, date of birth, ID band Patient awake    Reviewed: Allergy & Precautions, NPO status , Patient's Chart, lab work & pertinent test results  Airway Mallampati: II  TM Distance: >3 FB Neck ROM: Full    Dental no notable dental hx.    Pulmonary neg pulmonary ROS, former smoker,    Pulmonary exam normal breath sounds clear to auscultation       Cardiovascular hypertension, +CHF  Normal cardiovascular exam Rhythm:Regular Rate:Normal     Neuro/Psych negative neurological ROS  negative psych ROS   GI/Hepatic Neg liver ROS, GERD  ,  Endo/Other  negative endocrine ROSdiabetes, Type 2  Renal/GU CRFRenal disease  negative genitourinary   Musculoskeletal  (+) Arthritis , Osteoarthritis,    Abdominal   Peds negative pediatric ROS (+)  Hematology negative hematology ROS (+) anemia ,   Anesthesia Other Findings   Reproductive/Obstetrics negative OB ROS                            Anesthesia Physical Anesthesia Plan  ASA: III  Anesthesia Plan: General   Post-op Pain Management:    Induction: Intravenous  PONV Risk Score and Plan: 2 and Ondansetron, Midazolam and Treatment may vary due to age or medical condition  Airway Management Planned: Oral ETT  Additional Equipment:   Intra-op Plan:   Post-operative Plan: Extubation in OR  Informed Consent: I have reviewed the patients History and Physical, chart, labs and discussed the procedure including the risks, benefits and alternatives for the proposed anesthesia with the patient or authorized representative who has indicated his/her understanding and acceptance.     Dental advisory given  Plan Discussed with: CRNA  Anesthesia Plan Comments: (Seen by cardiology, Dr. Debara Pickett, 07/09/18 for preop clearance prior to AV fistula placement (pt ultimately did not have this surgery). Per Dr. Lysbeth Penner note  "Mr. Dziedzic had a low risk stress test in 2017 which showed a fixed defect likely related to left bundle branch block.  No reversible ischemia was noted.  In 2018 he had normal systolic function on echo.  He has no new symptoms concerning for ischemia.  I think he is acceptable risk for AV fistula placement and should start dialysis as necessary.  Plan follow-up with me annually or sooner as necessary."  He is followed by [facility] for CKD 5. He was last seen 01/16/19. Per OV note the pt was fluid overloaded at that time due to running out of Lasix. The pt also admitted to using cocaine again recently. His creatinine at that time was 3.44. His hemoglobin was found to be 4.1 and he was advised to be seen at the ED. He was ultimately admitted 8/13-8/15/20 and treated for symptomatic anemia and fluid overload. He received a total of 4 units PRBC. His SOB and edema improved with diuresis. His hemoglobin improved to 8.2. His creatinine was stable, 3.35 at discharge.   Per Dr. Redmond Baseman' office, pt was cleared for surgery by PCP Dr. Jeanie Cooks after recent hospitalization. They are faxing clearance.   TTE 03/27/17:  - Left ventricle: The cavity size was normal. There was moderate   concentric hypertrophy. Systolic function was normal. The   estimated ejection fraction was in the range of 55% to 60%. Wall   motion was normal; there were no regional wall motion   abnormalities. There was an increased relative contribution of   atrial contraction to ventricular filling. Doppler  parameters are   consistent with abnormal left ventricular relaxation (grade 1   diastolic dysfunction). Doppler parameters are consistent with   high ventricular filling pressure. - Aortic valve: Valve area (Vmax): 2.69 cm^2. - Mitral valve: Calcified annulus. There was mild regurgitation. - Right ventricle: The cavity size was moderately dilated. Wall   thickness was normal. - Right atrium: The atrium was mildly dilated.  )    Anesthesia Quick Evaluation

## 2019-03-12 ENCOUNTER — Ambulatory Visit (HOSPITAL_COMMUNITY): Payer: Medicare HMO | Admitting: Physician Assistant

## 2019-03-12 ENCOUNTER — Encounter (HOSPITAL_COMMUNITY)
Admission: RE | Disposition: A | Discharge status: Home / Self Care | Payer: Self-pay | Source: Home / Self Care | Attending: Otolaryngology

## 2019-03-12 ENCOUNTER — Other Ambulatory Visit: Payer: Self-pay | Admitting: Otolaryngology

## 2019-03-12 ENCOUNTER — Encounter (HOSPITAL_COMMUNITY): Payer: Self-pay

## 2019-03-12 ENCOUNTER — Other Ambulatory Visit: Payer: Self-pay

## 2019-03-12 ENCOUNTER — Ambulatory Visit (HOSPITAL_COMMUNITY)
Admission: RE | Admit: 2019-03-12 | Discharge: 2019-03-12 | Disposition: A | Discharge status: Home / Self Care | Payer: Medicare HMO | Source: Home / Self Care | Attending: Otolaryngology | Admitting: Otolaryngology

## 2019-03-12 DIAGNOSIS — Z885 Allergy status to narcotic agent status: Secondary | ICD-10-CM | POA: Insufficient documentation

## 2019-03-12 DIAGNOSIS — E785 Hyperlipidemia, unspecified: Secondary | ICD-10-CM | POA: Insufficient documentation

## 2019-03-12 DIAGNOSIS — K219 Gastro-esophageal reflux disease without esophagitis: Secondary | ICD-10-CM | POA: Insufficient documentation

## 2019-03-12 DIAGNOSIS — I509 Heart failure, unspecified: Secondary | ICD-10-CM | POA: Insufficient documentation

## 2019-03-12 DIAGNOSIS — M1990 Unspecified osteoarthritis, unspecified site: Secondary | ICD-10-CM | POA: Insufficient documentation

## 2019-03-12 DIAGNOSIS — Z79899 Other long term (current) drug therapy: Secondary | ICD-10-CM | POA: Insufficient documentation

## 2019-03-12 DIAGNOSIS — D141 Benign neoplasm of larynx: Secondary | ICD-10-CM | POA: Insufficient documentation

## 2019-03-12 DIAGNOSIS — I110 Hypertensive heart disease with heart failure: Secondary | ICD-10-CM | POA: Insufficient documentation

## 2019-03-12 DIAGNOSIS — Z794 Long term (current) use of insulin: Secondary | ICD-10-CM | POA: Insufficient documentation

## 2019-03-12 DIAGNOSIS — Z87891 Personal history of nicotine dependence: Secondary | ICD-10-CM | POA: Insufficient documentation

## 2019-03-12 DIAGNOSIS — E119 Type 2 diabetes mellitus without complications: Secondary | ICD-10-CM | POA: Insufficient documentation

## 2019-03-12 DIAGNOSIS — Z85038 Personal history of other malignant neoplasm of large intestine: Secondary | ICD-10-CM | POA: Insufficient documentation

## 2019-03-12 HISTORY — DX: Personal history of urinary calculi: Z87.442

## 2019-03-12 HISTORY — PX: DIRECT LARYNGOSCOPY: SHX5326

## 2019-03-12 HISTORY — DX: Hyperlipidemia, unspecified: E78.5

## 2019-03-12 HISTORY — DX: Unspecified cataract: H26.9

## 2019-03-12 HISTORY — PX: CO2 LASER APPLICATION: SHX5778

## 2019-03-12 LAB — GLUCOSE, CAPILLARY
Glucose-Capillary: 162 mg/dL — ABNORMAL HIGH (ref 70–99)
Glucose-Capillary: 151 mg/dL — ABNORMAL HIGH (ref 70–99)
Glucose-Capillary: 149 mg/dL — ABNORMAL HIGH (ref 70–99)

## 2019-03-12 LAB — COMPREHENSIVE METABOLIC PANEL
ALT: 10 U/L (ref 0–44)
AST: 13 U/L — ABNORMAL LOW (ref 15–41)
Albumin: 2.5 g/dL — ABNORMAL LOW (ref 3.5–5.0)
Alkaline Phosphatase: 100 U/L (ref 38–126)
Anion gap: 15 (ref 5–15)
BUN: 68 mg/dL — ABNORMAL HIGH (ref 8–23)
CO2: 17 mmol/L — ABNORMAL LOW (ref 22–32)
Calcium: 9.5 mg/dL (ref 8.9–10.3)
Chloride: 99 mmol/L (ref 98–111)
Creatinine, Ser: 4.26 mg/dL — ABNORMAL HIGH (ref 0.61–1.24)
GFR calc Af Amer: 15 mL/min — ABNORMAL LOW (ref >60)
GFR calc non Af Amer: 13 mL/min — ABNORMAL LOW (ref >60)
Glucose, Bld: 160 mg/dL — ABNORMAL HIGH (ref 70–99)
Potassium: 4.8 mmol/L (ref 3.5–5.1)
Sodium: 131 mmol/L — ABNORMAL LOW (ref 135–145)
Total Bilirubin: 0.8 mg/dL (ref 0.3–1.2)
Total Protein: 8.8 g/dL — ABNORMAL HIGH (ref 6.5–8.1)

## 2019-03-12 LAB — CBC
HCT: 20.4 % — ABNORMAL LOW (ref 39.0–52.0)
Hemoglobin: 6.6 g/dL — CL (ref 13.0–17.0)
MCH: 22.4 pg — ABNORMAL LOW (ref 26.0–34.0)
MCHC: 32.4 g/dL (ref 30.0–36.0)
MCV: 69.2 fL — ABNORMAL LOW (ref 80.0–100.0)
Platelets: 782 10*3/uL — ABNORMAL HIGH (ref 150–400)
RBC: 2.95 MIL/uL — ABNORMAL LOW (ref 4.22–5.81)
RDW: 27.2 % — ABNORMAL HIGH (ref 11.5–15.5)
WBC: 34.2 10*3/uL — ABNORMAL HIGH (ref 4.0–10.5)
nRBC: 0.1 % (ref 0.0–0.2)

## 2019-03-12 SURGERY — LARYNGOSCOPY, DIRECT
Anesthesia: General | Site: Throat

## 2019-03-12 MED ORDER — OXYMETAZOLINE HCL 0.05 % NA SOLN
NASAL | Status: AC
  Filled 2019-03-12: qty 30

## 2019-03-12 MED ORDER — MIDAZOLAM HCL 2 MG/2ML IJ SOLN
INTRAMUSCULAR | Status: AC
  Filled 2019-03-12: qty 2

## 2019-03-12 MED ORDER — 0.9 % SODIUM CHLORIDE (POUR BTL) OPTIME
TOPICAL | Status: DC | PRN
  Administered 2019-03-12: 1000 mL

## 2019-03-12 MED ORDER — LIDOCAINE 2% (20 MG/ML) 5 ML SYRINGE
INTRAMUSCULAR | Status: AC
  Filled 2019-03-12: qty 5

## 2019-03-12 MED ORDER — LIDOCAINE HCL (CARDIAC) PF 100 MG/5ML IV SOSY
PREFILLED_SYRINGE | INTRAVENOUS | Status: DC | PRN
  Administered 2019-03-12: 60 mg via INTRAVENOUS

## 2019-03-12 MED ORDER — PROPOFOL 10 MG/ML IV BOLUS
INTRAVENOUS | Status: DC | PRN
  Administered 2019-03-12: 120 mg via INTRAVENOUS

## 2019-03-12 MED ORDER — LIDOCAINE HCL 2 % IJ SOLN
INTRAMUSCULAR | Status: DC | PRN
  Administered 2019-03-12: 1 mL

## 2019-03-12 MED ORDER — PROMETHAZINE HCL 25 MG/ML IJ SOLN: 6.2500 mg | INTRAMUSCULAR | Status: DC | PRN

## 2019-03-12 MED ORDER — DEXAMETHASONE SODIUM PHOSPHATE 10 MG/ML IJ SOLN
INTRAMUSCULAR | Status: DC | PRN
  Administered 2019-03-12: 10 mg via INTRAVENOUS

## 2019-03-12 MED ORDER — FENTANYL CITRATE (PF) 100 MCG/2ML IJ SOLN
INTRAMUSCULAR | Status: DC | PRN
  Administered 2019-03-12: 100 ug via INTRAVENOUS

## 2019-03-12 MED ORDER — SUGAMMADEX SODIUM 200 MG/2ML IV SOLN
INTRAVENOUS | Status: DC | PRN
  Administered 2019-03-12: 200 mg via INTRAVENOUS

## 2019-03-12 MED ORDER — EPINEPHRINE HCL (NASAL) 0.1 % NA SOLN
NASAL | Status: AC
  Filled 2019-03-12: qty 30

## 2019-03-12 MED ORDER — FENTANYL CITRATE (PF) 250 MCG/5ML IJ SOLN
INTRAMUSCULAR | Status: AC
  Filled 2019-03-12: qty 5

## 2019-03-12 MED ORDER — STERILE WATER FOR IRRIGATION IR SOLN
Status: DC | PRN
  Administered 2019-03-12: 1000 mL

## 2019-03-12 MED ORDER — OXYCODONE HCL 5 MG/5ML PO SOLN: 5.0000 mg | Freq: Once | ORAL | Status: DC | PRN

## 2019-03-12 MED ORDER — ONDANSETRON HCL 4 MG/2ML IJ SOLN
INTRAMUSCULAR | Status: DC | PRN
  Administered 2019-03-12: 4 mg via INTRAVENOUS

## 2019-03-12 MED ORDER — HYDROMORPHONE HCL 1 MG/ML IJ SOLN: 0.2500 mg | INTRAMUSCULAR | Status: DC | PRN

## 2019-03-12 MED ORDER — EPINEPHRINE HCL (NASAL) 0.1 % NA SOLN
NASAL | Status: DC | PRN
  Administered 2019-03-12: 1 [drp] via TOPICAL

## 2019-03-12 MED ORDER — ROCURONIUM BROMIDE 100 MG/10ML IV SOLN
INTRAVENOUS | Status: DC | PRN
  Administered 2019-03-12: 30 mg via INTRAVENOUS

## 2019-03-12 MED ORDER — SUCCINYLCHOLINE CHLORIDE 20 MG/ML IJ SOLN
INTRAMUSCULAR | Status: DC | PRN
  Administered 2019-03-12: 120 mg via INTRAVENOUS

## 2019-03-12 MED ORDER — OXYCODONE HCL 5 MG PO TABS: 5.0000 mg | ORAL_TABLET | Freq: Once | ORAL | Status: DC | PRN

## 2019-03-12 MED ORDER — SODIUM CHLORIDE 0.9 % IV SOLN
INTRAVENOUS | Status: DC
  Administered 2019-03-12: 08:00:00 via INTRAVENOUS

## 2019-03-12 MED ORDER — PROPOFOL 10 MG/ML IV BOLUS
INTRAVENOUS | Status: AC
  Filled 2019-03-12: qty 20

## 2019-03-12 MED ORDER — MIDAZOLAM HCL 5 MG/5ML IJ SOLN
INTRAMUSCULAR | Status: DC | PRN
  Administered 2019-03-12: 2 mg via INTRAVENOUS

## 2019-03-12 SURGICAL SUPPLY — 26 items
BALLN PULM 15 16.5 18 X 75CM (BALLOONS)
BALLN PULM 15 16.5 18X75 (BALLOONS)
BALLOON PULM 15 16.5 18X75 (BALLOONS) IMPLANT
CANISTER SUCT 3000ML PPV (MISCELLANEOUS) ×3 IMPLANT
CONT SPEC 4OZ CLIKSEAL STRL BL (MISCELLANEOUS) IMPLANT
COVER BACK TABLE 60X90IN (DRAPES) ×3 IMPLANT
COVER MAYO STAND STRL (DRAPES) ×3 IMPLANT
DRAPE HALF SHEET 40X57 (DRAPES) ×3 IMPLANT
GAUZE 4X4 16PLY RFD (DISPOSABLE) IMPLANT
GLOVE BIO SURGEON STRL SZ7.5 (GLOVE) ×3 IMPLANT
GOWN STRL REUS W/ TWL LRG LVL3 (GOWN DISPOSABLE) ×1 IMPLANT
GOWN STRL REUS W/TWL LRG LVL3 (GOWN DISPOSABLE) ×2
GUARD TEETH (MISCELLANEOUS) ×3 IMPLANT
KIT TURNOVER KIT B (KITS) ×3 IMPLANT
NEEDLE HYPO 25GX1X1/2 BEV (NEEDLE) IMPLANT
NEEDLE TRANS ORAL INJECTION (NEEDLE) IMPLANT
NS IRRIG 1000ML POUR BTL (IV SOLUTION) ×3 IMPLANT
PAD ARMBOARD 7.5X6 YLW CONV (MISCELLANEOUS) ×6 IMPLANT
PATTIES SURGICAL .5 X3 (DISPOSABLE) ×3 IMPLANT
POSITIONER HEAD DONUT 9IN (MISCELLANEOUS) IMPLANT
SOL ANTI FOG 6CC (MISCELLANEOUS) ×1 IMPLANT
SOLUTION ANTI FOG 6CC (MISCELLANEOUS) ×2
SURGILUBE 2OZ TUBE FLIPTOP (MISCELLANEOUS) ×3 IMPLANT
TOWEL GREEN STERILE FF (TOWEL DISPOSABLE) ×6 IMPLANT
TUBE CONNECTING 12'X1/4 (SUCTIONS) ×1
TUBE CONNECTING 12X1/4 (SUCTIONS) ×2 IMPLANT

## 2019-03-12 NOTE — Brief Op Note (Signed)
03/12/2019  9:32 AM  PATIENT:  [name]  68 y.o. 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  PROCEDURE:  Procedure(s): SUSPENDED MICRO DIRECT LARYNGOSCOPY (N/A) CO2 LASER EXCISION OF VOCAL FOLD LESION (N/A)  SURGEON:  Surgeon(s) and Role:    Melida Quitter, MD - Primary  PHYSICIAN ASSISTANT:   ASSISTANTS: none   ANESTHESIA:   general  EBL: None  BLOOD ADMINISTERED:none  DRAINS: none   LOCAL MEDICATIONS USED:  None  SPECIMEN:  Source of Specimen:  Left vocal fold lesion  DISPOSITION OF SPECIMEN:  PATHOLOGY  COUNTS:  YES  TOURNIQUET:  * No tourniquets in log *  DICTATION: .Other Dictation: Dictation Number [id number]  PLAN OF CARE: Discharge to home after PACU  PATIENT DISPOSITION:  PACU - hemodynamically stable.   Delay start of Pharmacological VTE agent (>24hrs) due to surgical blood loss or risk of bleeding: no

## 2019-03-12 NOTE — Op Note (Signed)
NAME: [name], [name] MEDICAL RECORD [id number] ACCOUNT [id number] DATE OF BIRTH:April 04, 1951 FACILITY: [facility] LOCATION: [facility] PHYSICIAN:Alcee Sipos Guido Sander, MD  OPERATIVE REPORT  DATE OF PROCEDURE:  03/12/2019  PREOPERATIVE DIAGNOSES: 1.  Dysphonia. 2.  Left vocal fold lesion.  POSTOPERATIVE DIAGNOSES: 1.  Dysphonia. 2.  Left vocal fold lesion.  PROCEDURE:  Suspended microdirect laryngoscopy with CO2 laser excision of left vocal fold lesion.  SURGEON:  Melida Quitter, MD  ANESTHESIA:  General endotracheal anesthesia.  COMPLICATIONS:  None.  INDICATIONS:  The patient is a 68 year old male with over 1 year of hoarseness, found to have a vocal fold lesion last year.  Excision was delayed because of the COVID pandemic, and he had no improvement in his symptoms.  The lesion remains and is a  pedunculated lesion at the anterior commissure.  He presents to the operating room for surgical management.  FINDINGS:  There was a very superficially attached pedunculated lesion of the left anterior vocal fold that was able to be removed in a superficial plane.  DESCRIPTION OF PROCEDURE:  The patient was identified in the holding room, informed consent having been obtained including discussion of risks, benefits, and alternatives.  The patient was brought to the operative suite and put on the operative table in  supine position.  Anesthesia was induced and the patient was intubated by the anesthesia team without difficulty using a laser safe tube.  The eyes were taped closed, and the bed was turned 90 degrees from anesthesia.  The patient was given intravenous  steroids during the case.  Head wrap was placed from the patient's head, and a damp gauze was placed over the upper gum.  A Stortz laryngoscope was inserted into the supraglottic position and suspended to the Mayo stand using the Lewy arm.  A 0-degree  telescope was used to make a preoperative photograph.  The operating microscope was then  brought into view, and an epinephrine-soaked pledget was placed against the lesion.  The lesion was then grasped with cup forceps and retracted medially while an  incision was made at the base of the lesion using the CO2 laser in the superficial plane until the lesion was removed.  The lesion was then passed to nursing for pathology.  After this was completed, the wound was again rubbed with the epinephrine  pledget.  A postoperative photograph was made with the 0-degree telescope.  The larynx was sprayed with topical lidocaine and the laryngoscope taken out of suspension and removed from the patient's mouth while suctioning the airway.  He was turned back  to anesthesia for wakeup, was extubated and taken to the recovery room in stable condition.  Of note, damp eye pads were taped over the eyes and a damp towel placed over the face during laser use.  LN/NUANCE  D:03/12/2019 T:03/12/2019 JOB:[id number]

## 2019-03-12 NOTE — H&P (Signed)
[name] is an 68 y.o. male.   Chief Complaint: Vocal cord lesion HPI: 68 year old male with hoarseness related to an anterior commissure lesion.  He presents for surgical management.  Past Medical History:  Diagnosis Date  . Arthritis   . Cancer ([facility])    colon  . Cataracts, bilateral    surgery to remove  . Diabetes mellitus    type 2  . GERD (gastroesophageal reflux disease)   . Heart murmur   . History of kidney stones    passed stone, no surgery  . Hyperlipidemia   . Hypertension   . Lesion of vocal cord   . Pneumonia 2019   x 2  . Renal insufficiency    no dialysis - stagte 5  . Sickle cell anemia ([facility])    Hgb SS disease   . Wears glasses     Past Surgical History:  Procedure Laterality Date  . AV FISTULA PLACEMENT Left 11/22/2016   Procedure: INSERTION OF ARTERIOVENOUS (AV) GORE-TEX GRAFT  LEFT FOREARM USING 6MM X 50CM GORETEX GRAFT;  Surgeon: Serafina Mitchell, MD;  Location: [facility];  Service: Vascular;  Laterality: Left;  . CATARACT EXTRACTION W/ INTRAOCULAR LENS  IMPLANT, BILATERAL    . CHOLECYSTECTOMY    . COLON SURGERY     Colon cancer surgery, removed small amt not full colectomy  . COLONOSCOPY     nclear who follows him for this  . LEG SURGERY     For ? gangrene after running into barbwire fence at 68yo  . MULTIPLE TOOTH EXTRACTIONS     no dentures    Family History  Problem Relation Age of Onset  . Venous thrombosis Mother        Dec. of blood clot   . Diabetes type II Mother   . AAA (abdominal aortic aneurysm) Mother   . Throat cancer Father        Deceased of throat cancer   Social History:  reports that he quit smoking about 52 years ago. His smoking use included cigarettes. He has a 10.00 pack-year smoking history. He has never used smokeless tobacco. He reports current alcohol use. He reports previous drug use. Drugs: Cocaine and "Crack" cocaine.  Allergies:  Allergies  Allergen Reactions  . Morphine And Related Itching     Medications Prior to Admission  Medication Sig Dispense Refill  . acetaminophen (TYLENOL) 500 MG tablet Take 500 mg by mouth every 6 (six) hours as needed for headache (pain).    Marland Kitchen amLODipine (NORVASC) 5 MG tablet Take 5 mg by mouth daily.  5  . atorvastatin (LIPITOR) 20 MG tablet Take 20 mg by mouth daily.     . calcitRIOL (ROCALTROL) 0.25 MCG capsule Take 0.25 mcg by mouth every other day. In the morning.    . calcium acetate (PHOSLO) 667 MG capsule Take 667 mg by mouth 3 (three) times daily with meals.    . carvedilol (COREG) 25 MG tablet Take 25 mg by mouth 2 (two) times daily.  2  . ferrous sulfate (FEOSOL) 325 (65 FE) MG tablet Take 1 tablet (325 mg total) by mouth 2 (two) times daily with a meal. (Patient taking differently: Take 325 mg by mouth 2 (two) times daily. ) 60 tablet 3  . folic acid (FOLVITE) 099 MCG tablet Take 800 mcg by mouth daily.    . furosemide (LASIX) 40 MG tablet Take 40 mg by mouth 2 (two) times daily. Morning & 1400    .  gabapentin (NEURONTIN) 300 MG capsule Take 1 capsule (300 mg total) by mouth at bedtime. (Patient taking differently: Take 300 mg by mouth 3 (three) times daily. )  1  . insulin aspart (NOVOLOG FLEXPEN) 100 UNIT/ML FlexPen Inject 2-6 Units into the skin 3 (three) times daily as needed for high blood sugar (CBG >150).    Marland Kitchen omeprazole (PRILOSEC) 20 MG capsule Take 20 mg by mouth daily.   5  . ONGLYZA 5 MG TABS tablet Take 5 mg by mouth daily.     . polyethylene glycol powder (MIRALAX) powder Take 17 g by mouth daily. (Patient taking differently: Take 17 g by mouth daily as needed (constipation). Mix in 8 oz liquid and drink) 255 g 0  . VENTOLIN HFA 108 (90 Base) MCG/ACT inhaler Inhale 2 puffs into the lungs every 6 (six) hours as needed for wheezing.   2    Results for orders placed or performed during the hospital encounter of 03/12/19 (from the past 48 hour(s))  Glucose, capillary     Status: Abnormal   Collection Time: 03/12/19  7:03 AM   Result Value Ref Range   Glucose-Capillary 162 (H) 70 - 99 mg/dL  CBC     Status: Abnormal   Collection Time: 03/12/19  7:58 AM  Result Value Ref Range   WBC 34.2 (H) 4.0 - 10.5 K/uL   RBC 2.95 (L) 4.22 - 5.81 MIL/uL   Hemoglobin 6.6 (LL) 13.0 - 17.0 g/dL    Comment: Reticulocyte Hemoglobin testing may be clinically indicated, consider ordering this additional test [id number] THIS CRITICAL RESULT HAS VERIFIED AND BEEN CALLED TO S. KOTIAN,RN BY ZELDA BEECH ON 10 07 2020 AT 0836, AND HAS BEEN READ BACK.     HCT 20.4 (L) 39.0 - 52.0 %   MCV 69.2 (L) 80.0 - 100.0 fL   MCH 22.4 (L) 26.0 - 34.0 pg   MCHC 32.4 30.0 - 36.0 g/dL   RDW 27.2 (H) 11.5 - 15.5 %   Platelets 782 (H) 150 - 400 K/uL   nRBC 0.1 0.0 - 0.2 %    Comment: Performed at [facility] [address], [place], [place] [address]  Comprehensive metabolic panel     Status: Abnormal   Collection Time: 03/12/19  7:59 AM  Result Value Ref Range   Sodium 131 (L) 135 - 145 mmol/L   Potassium 4.8 3.5 - 5.1 mmol/L   Chloride 99 98 - 111 mmol/L   CO2 17 (L) 22 - 32 mmol/L   Glucose, Bld 160 (H) 70 - 99 mg/dL   BUN 68 (H) 8 - 23 mg/dL   Creatinine, Ser 4.26 (H) 0.61 - 1.24 mg/dL   Calcium 9.5 8.9 - 10.3 mg/dL   Total Protein 8.8 (H) 6.5 - 8.1 g/dL   Albumin 2.5 (L) 3.5 - 5.0 g/dL   AST 13 (L) 15 - 41 U/L   ALT 10 0 - 44 U/L   Alkaline Phosphatase 100 38 - 126 U/L   Total Bilirubin 0.8 0.3 - 1.2 mg/dL   GFR calc non Af Amer 13 (L) >60 mL/min   GFR calc Af Amer 15 (L) >60 mL/min   Anion gap 15 5 - 15    Comment: Performed at [facility], [place] [address] [place], [place] [address]  Glucose, capillary     Status: Abnormal   Collection Time: 03/12/19  8:24 AM  Result Value Ref Range   Glucose-Capillary 151 (H) 70 - 99 mg/dL  No results found.  Review of Systems  Musculoskeletal: Positive for back pain.  All other systems reviewed and are negative.   Blood pressure 131/70, pulse 75, temperature 99.1 F (37.3  C), temperature source Oral, resp. rate 18, height 6\' 1"  (1.854 m), weight 64 kg, SpO2 100 %. Physical Exam  Constitutional: He is oriented to person, place, and time. He appears well-developed and well-nourished. No distress.  HENT:  Head: Normocephalic and atraumatic.  Right Ear: External ear normal.  Left Ear: External ear normal.  Nose: Nose normal.  Mouth/Throat: Oropharynx is clear and moist.  Some hoarseness.  Eyes: Pupils are equal, round, and reactive to light. Conjunctivae and EOM are normal.  Neck: Normal range of motion. Neck supple.  Cardiovascular: Normal rate.  Respiratory: Effort normal.  Neurological: He is alert and oriented to person, place, and time. No cranial nerve deficit.  Skin: Skin is warm and dry.  Psychiatric: He has a normal mood and affect. His behavior is normal. Judgment and thought content normal.     Assessment/Plan Hoarseness, vocal fold lesion  To OR for microlaryngoscopy with CO2 laser excision.  Melida Quitter, MD 03/12/2019, 8:41 AM

## 2019-03-12 NOTE — Anesthesia Postprocedure Evaluation (Signed)
Anesthesia Post Note  Patient: [name]  Procedure(s) Performed: SUSPENDED MICRO DIRECT LARYNGOSCOPY (N/A Throat) CO2 LASER EXCISION OF VOCAL FOLD LESION (N/A Throat)     Patient location during evaluation: PACU Anesthesia Type: General Level of consciousness: awake and alert Pain management: pain level controlled Vital Signs Assessment: post-procedure vital signs reviewed and stable Respiratory status: spontaneous breathing, nonlabored ventilation and respiratory function stable Cardiovascular status: blood pressure returned to baseline and stable Postop Assessment: no apparent nausea or vomiting Anesthetic complications: no    Last Vitals:  Vitals:   03/12/19 1000 03/12/19 1015  BP: 113/72   Pulse: 73   Resp: (!) 26   Temp:  36.9 C  SpO2: 100%     Last Pain:  Vitals:   03/12/19 1015  TempSrc:   PainSc: 2                  Lynda Rainwater

## 2019-03-12 NOTE — Anesthesia Procedure Notes (Signed)
Procedure Name: Intubation Date/Time: 03/12/2019 9:16 AM Performed by: Mettie Roylance T, CRNA Pre-anesthesia Checklist: Patient identified, Emergency Drugs available, Suction available and Patient being monitored Patient Re-evaluated:Patient Re-evaluated prior to induction Oxygen Delivery Method: Circle system utilized Preoxygenation: Pre-oxygenation with 100% oxygen Induction Type: IV induction Ventilation: Mask ventilation without difficulty Laryngoscope Size: Miller and 3 Grade View: Grade I Laser Tube: Laser Tube and Cuffed inflated with minimal occlusive pressure - saline Tube size: 5.5 mm Number of attempts: 1 Airway Equipment and Method: Patient positioned with wedge pillow and Stylet Placement Confirmation: ETT inserted through vocal cords under direct vision,  positive ETCO2 and breath sounds checked- equal and bilateral Tube secured with: Tape Dental Injury: Teeth and Oropharynx as per pre-operative assessment

## 2019-03-12 NOTE — Progress Notes (Signed)
Hgb=6.6, critical value.Pt with h/o anemia. Dr Sabra Heck notified.No further orders given.

## 2019-03-12 NOTE — Transfer of Care (Signed)
Immediate Anesthesia Transfer of Care Note  Patient: [name]  Procedure(s) Performed: SUSPENDED MICRO DIRECT LARYNGOSCOPY (N/A Throat) CO2 LASER EXCISION OF VOCAL FOLD LESION (N/A Throat)  Patient Location: PACU  Anesthesia Type:General  Level of Consciousness: awake, alert  and oriented  Airway & Oxygen Therapy: Patient Spontanous Breathing and Patient connected to nasal cannula oxygen  Post-op Assessment: Report given to RN and Post -op Vital signs reviewed and stable  Post vital signs: Reviewed and stable  Last Vitals:  Vitals Value Taken Time  BP 123/73 03/12/19 0946  Temp    Pulse 70 03/12/19 0949  Resp 18 03/12/19 0949  SpO2 100 % 03/12/19 0949  Vitals shown include unvalidated device data.  Last Pain:  Vitals:   03/12/19 0736  TempSrc:   PainSc: 0-No pain      Patients Stated Pain Goal: 3 (18/59/09 3112)  Complications: No apparent anesthesia complications

## 2019-03-13 ENCOUNTER — Encounter (HOSPITAL_COMMUNITY): Payer: Self-pay | Admitting: Otolaryngology

## 2019-03-14 LAB — SURGICAL PATHOLOGY

## 2019-03-15 ENCOUNTER — Encounter (HOSPITAL_COMMUNITY): Payer: Self-pay | Admitting: Emergency Medicine

## 2019-03-15 ENCOUNTER — Emergency Department (HOSPITAL_COMMUNITY): Payer: Medicare HMO

## 2019-03-15 ENCOUNTER — Other Ambulatory Visit: Payer: Self-pay

## 2019-03-15 ENCOUNTER — Inpatient Hospital Stay (HOSPITAL_COMMUNITY)
Admission: EM | Admit: 2019-03-15 | Discharge: 2019-04-03 | DRG: 356 | Disposition: A | Discharge status: Home Health | Payer: Medicare HMO | Attending: Internal Medicine | Admitting: Internal Medicine

## 2019-03-15 DIAGNOSIS — E440 Moderate protein-calorie malnutrition: Secondary | ICD-10-CM | POA: Insufficient documentation

## 2019-03-15 DIAGNOSIS — K922 Gastrointestinal hemorrhage, unspecified: Secondary | ICD-10-CM | POA: Diagnosis present

## 2019-03-15 DIAGNOSIS — R112 Nausea with vomiting, unspecified: Secondary | ICD-10-CM | POA: Diagnosis present

## 2019-03-15 DIAGNOSIS — K37 Unspecified appendicitis: Principal | ICD-10-CM

## 2019-03-15 DIAGNOSIS — D473 Essential (hemorrhagic) thrombocythemia: Secondary | ICD-10-CM

## 2019-03-15 DIAGNOSIS — C182 Malignant neoplasm of ascending colon: Principal | ICD-10-CM | POA: Diagnosis present

## 2019-03-15 DIAGNOSIS — J910 Malignant pleural effusion: Secondary | ICD-10-CM | POA: Diagnosis present

## 2019-03-15 DIAGNOSIS — Z452 Encounter for adjustment and management of vascular access device: Secondary | ICD-10-CM

## 2019-03-15 DIAGNOSIS — Z681 Body mass index (BMI) 19 or less, adult: Secondary | ICD-10-CM

## 2019-03-15 DIAGNOSIS — Z7189 Other specified counseling: Secondary | ICD-10-CM | POA: Diagnosis not present

## 2019-03-15 DIAGNOSIS — D62 Acute posthemorrhagic anemia: Secondary | ICD-10-CM | POA: Diagnosis present

## 2019-03-15 DIAGNOSIS — Z992 Dependence on renal dialysis: Secondary | ICD-10-CM | POA: Diagnosis not present

## 2019-03-15 DIAGNOSIS — Z20828 Contact with and (suspected) exposure to other viral communicable diseases: Secondary | ICD-10-CM | POA: Diagnosis present

## 2019-03-15 DIAGNOSIS — I447 Left bundle-branch block, unspecified: Secondary | ICD-10-CM | POA: Diagnosis present

## 2019-03-15 DIAGNOSIS — I132 Hypertensive heart and chronic kidney disease with heart failure and with stage 5 chronic kidney disease, or end stage renal disease: Secondary | ICD-10-CM | POA: Diagnosis present

## 2019-03-15 DIAGNOSIS — E1129 Type 2 diabetes mellitus with other diabetic kidney complication: Secondary | ICD-10-CM | POA: Diagnosis present

## 2019-03-15 DIAGNOSIS — E1165 Type 2 diabetes mellitus with hyperglycemia: Secondary | ICD-10-CM | POA: Diagnosis not present

## 2019-03-15 DIAGNOSIS — C189 Malignant neoplasm of colon, unspecified: Secondary | ICD-10-CM | POA: Diagnosis present

## 2019-03-15 DIAGNOSIS — F1410 Cocaine abuse, uncomplicated: Secondary | ICD-10-CM | POA: Diagnosis present

## 2019-03-15 DIAGNOSIS — E1122 Type 2 diabetes mellitus with diabetic chronic kidney disease: Secondary | ICD-10-CM | POA: Diagnosis present

## 2019-03-15 DIAGNOSIS — R188 Other ascites: Secondary | ICD-10-CM | POA: Diagnosis present

## 2019-03-15 DIAGNOSIS — R490 Dysphonia: Secondary | ICD-10-CM | POA: Diagnosis present

## 2019-03-15 DIAGNOSIS — Z87442 Personal history of urinary calculi: Secondary | ICD-10-CM

## 2019-03-15 DIAGNOSIS — K529 Noninfective gastroenteritis and colitis, unspecified: Secondary | ICD-10-CM

## 2019-03-15 DIAGNOSIS — R011 Cardiac murmur, unspecified: Secondary | ICD-10-CM | POA: Diagnosis present

## 2019-03-15 DIAGNOSIS — D631 Anemia in chronic kidney disease: Secondary | ICD-10-CM | POA: Diagnosis present

## 2019-03-15 DIAGNOSIS — E785 Hyperlipidemia, unspecified: Secondary | ICD-10-CM | POA: Diagnosis present

## 2019-03-15 DIAGNOSIS — I10 Essential (primary) hypertension: Secondary | ICD-10-CM | POA: Diagnosis present

## 2019-03-15 DIAGNOSIS — Z794 Long term (current) use of insulin: Secondary | ICD-10-CM

## 2019-03-15 DIAGNOSIS — Z515 Encounter for palliative care: Secondary | ICD-10-CM | POA: Diagnosis not present

## 2019-03-15 DIAGNOSIS — Z9049 Acquired absence of other specified parts of digestive tract: Secondary | ICD-10-CM

## 2019-03-15 DIAGNOSIS — Z85038 Personal history of other malignant neoplasm of large intestine: Secondary | ICD-10-CM

## 2019-03-15 DIAGNOSIS — E11649 Type 2 diabetes mellitus with hypoglycemia without coma: Secondary | ICD-10-CM | POA: Diagnosis not present

## 2019-03-15 DIAGNOSIS — N186 End stage renal disease: Secondary | ICD-10-CM

## 2019-03-15 DIAGNOSIS — D75839 Thrombocytosis, unspecified: Secondary | ICD-10-CM

## 2019-03-15 DIAGNOSIS — Z8701 Personal history of pneumonia (recurrent): Secondary | ICD-10-CM

## 2019-03-15 DIAGNOSIS — Z79899 Other long term (current) drug therapy: Secondary | ICD-10-CM

## 2019-03-15 DIAGNOSIS — C800 Disseminated malignant neoplasm, unspecified: Secondary | ICD-10-CM | POA: Diagnosis present

## 2019-03-15 DIAGNOSIS — C180 Malignant neoplasm of cecum: Secondary | ICD-10-CM

## 2019-03-15 DIAGNOSIS — I5032 Chronic diastolic (congestive) heart failure: Secondary | ICD-10-CM | POA: Diagnosis present

## 2019-03-15 DIAGNOSIS — Z87891 Personal history of nicotine dependence: Secondary | ICD-10-CM

## 2019-03-15 DIAGNOSIS — N185 Chronic kidney disease, stage 5: Secondary | ICD-10-CM | POA: Diagnosis present

## 2019-03-15 DIAGNOSIS — Z961 Presence of intraocular lens: Secondary | ICD-10-CM | POA: Diagnosis present

## 2019-03-15 DIAGNOSIS — Z66 Do not resuscitate: Secondary | ICD-10-CM | POA: Diagnosis present

## 2019-03-15 DIAGNOSIS — K567 Ileus, unspecified: Secondary | ICD-10-CM | POA: Diagnosis not present

## 2019-03-15 DIAGNOSIS — Z9841 Cataract extraction status, right eye: Secondary | ICD-10-CM

## 2019-03-15 DIAGNOSIS — D141 Benign neoplasm of larynx: Secondary | ICD-10-CM | POA: Diagnosis present

## 2019-03-15 DIAGNOSIS — K219 Gastro-esophageal reflux disease without esophagitis: Secondary | ICD-10-CM | POA: Diagnosis present

## 2019-03-15 DIAGNOSIS — D571 Sickle-cell disease without crisis: Secondary | ICD-10-CM | POA: Diagnosis present

## 2019-03-15 DIAGNOSIS — Z885 Allergy status to narcotic agent status: Secondary | ICD-10-CM

## 2019-03-15 DIAGNOSIS — Z9842 Cataract extraction status, left eye: Secondary | ICD-10-CM

## 2019-03-15 DIAGNOSIS — Z833 Family history of diabetes mellitus: Secondary | ICD-10-CM

## 2019-03-15 DIAGNOSIS — M1990 Unspecified osteoarthritis, unspecified site: Secondary | ICD-10-CM | POA: Diagnosis present

## 2019-03-15 DIAGNOSIS — Z01810 Encounter for preprocedural cardiovascular examination: Secondary | ICD-10-CM | POA: Diagnosis not present

## 2019-03-15 DIAGNOSIS — Z808 Family history of malignant neoplasm of other organs or systems: Secondary | ICD-10-CM

## 2019-03-15 DIAGNOSIS — D509 Iron deficiency anemia, unspecified: Secondary | ICD-10-CM | POA: Diagnosis present

## 2019-03-15 DIAGNOSIS — N184 Chronic kidney disease, stage 4 (severe): Secondary | ICD-10-CM | POA: Diagnosis not present

## 2019-03-15 DIAGNOSIS — D649 Anemia, unspecified: Secondary | ICD-10-CM | POA: Diagnosis not present

## 2019-03-15 LAB — COMPREHENSIVE METABOLIC PANEL
ALT: 10 U/L (ref 0–44)
AST: 16 U/L (ref 15–41)
Albumin: 2.5 g/dL — ABNORMAL LOW (ref 3.5–5.0)
Alkaline Phosphatase: 85 U/L (ref 38–126)
Anion gap: 11 (ref 5–15)
BUN: 56 mg/dL — ABNORMAL HIGH (ref 8–23)
CO2: 21 mmol/L — ABNORMAL LOW (ref 22–32)
Calcium: 9.3 mg/dL (ref 8.9–10.3)
Chloride: 101 mmol/L (ref 98–111)
Creatinine, Ser: 3.24 mg/dL — ABNORMAL HIGH (ref 0.61–1.24)
GFR calc Af Amer: 21 mL/min — ABNORMAL LOW (ref >60)
GFR calc non Af Amer: 18 mL/min — ABNORMAL LOW (ref >60)
Glucose, Bld: 160 mg/dL — ABNORMAL HIGH (ref 70–99)
Potassium: 5.1 mmol/L (ref 3.5–5.1)
Sodium: 133 mmol/L — ABNORMAL LOW (ref 135–145)
Total Bilirubin: 0.5 mg/dL (ref 0.3–1.2)
Total Protein: 7.9 g/dL (ref 6.5–8.1)

## 2019-03-15 LAB — CBC
HCT: 22.6 % — ABNORMAL LOW (ref 39.0–52.0)
Hemoglobin: 7 g/dL — ABNORMAL LOW (ref 13.0–17.0)
MCH: 21.8 pg — ABNORMAL LOW (ref 26.0–34.0)
MCHC: 31 g/dL (ref 30.0–36.0)
MCV: 70.4 fL — ABNORMAL LOW (ref 80.0–100.0)
Platelets: 981 10*3/uL (ref 150–400)
RBC: 3.21 MIL/uL — ABNORMAL LOW (ref 4.22–5.81)
RDW: 26.7 % — ABNORMAL HIGH (ref 11.5–15.5)
WBC: 39.5 10*3/uL — ABNORMAL HIGH (ref 4.0–10.5)
nRBC: 0.5 % — ABNORMAL HIGH (ref 0.0–0.2)

## 2019-03-15 LAB — URINALYSIS, ROUTINE W REFLEX MICROSCOPIC
Bilirubin Urine: NEGATIVE
Glucose, UA: NEGATIVE mg/dL
Hgb urine dipstick: NEGATIVE
Ketones, ur: NEGATIVE mg/dL
Leukocytes,Ua: NEGATIVE
Nitrite: NEGATIVE
Protein, ur: NEGATIVE mg/dL
Specific Gravity, Urine: 1.012 (ref 1.005–1.030)
pH: 5 (ref 5.0–8.0)

## 2019-03-15 LAB — RAPID URINE DRUG SCREEN, HOSP PERFORMED
Amphetamines: NOT DETECTED
Barbiturates: NOT DETECTED
Benzodiazepines: POSITIVE — AB
Cocaine: NOT DETECTED
Opiates: NOT DETECTED
Tetrahydrocannabinol: NOT DETECTED

## 2019-03-15 LAB — GLUCOSE, CAPILLARY: Glucose-Capillary: 193 mg/dL — ABNORMAL HIGH (ref 70–99)

## 2019-03-15 LAB — SARS CORONAVIRUS 2 (TAT 6-24 HRS): SARS Coronavirus 2: NEGATIVE

## 2019-03-15 LAB — LIPASE, BLOOD: Lipase: 51 U/L (ref 11–51)

## 2019-03-15 MED ORDER — DOCUSATE SODIUM 283 MG RE ENEM
1.0000 | ENEMA | RECTAL | Status: DC | PRN
  Filled 2019-03-15: qty 1

## 2019-03-15 MED ORDER — CARVEDILOL 25 MG PO TABS
25.0000 mg | ORAL_TABLET | Freq: Two times a day (BID) | ORAL | Status: DC
  Administered 2019-03-15 – 2019-04-03 (×35): 25 mg via ORAL
  Filled 2019-03-15 (×6): qty 1
  Filled 2019-03-15: qty 2
  Filled 2019-03-15 (×28): qty 1

## 2019-03-15 MED ORDER — CALCITRIOL 0.25 MCG PO CAPS
0.2500 ug | ORAL_CAPSULE | ORAL | Status: DC
  Administered 2019-03-16: 0.25 ug via ORAL
  Filled 2019-03-15: qty 1

## 2019-03-15 MED ORDER — FENTANYL CITRATE (PF) 100 MCG/2ML IJ SOLN
25.0000 ug | INTRAMUSCULAR | Status: DC | PRN
  Administered 2019-03-15 – 2019-03-17 (×9): 25 ug via INTRAVENOUS
  Filled 2019-03-15 (×9): qty 2

## 2019-03-15 MED ORDER — CALCIUM CARBONATE ANTACID 1250 MG/5ML PO SUSP
500.0000 mg | Freq: Four times a day (QID) | ORAL | Status: DC | PRN
  Filled 2019-03-15 (×2): qty 5

## 2019-03-15 MED ORDER — ALBUTEROL SULFATE (2.5 MG/3ML) 0.083% IN NEBU: 2.5000 mg | INHALATION_SOLUTION | Freq: Four times a day (QID) | RESPIRATORY_TRACT | Status: DC | PRN

## 2019-03-15 MED ORDER — CALCIUM ACETATE (PHOS BINDER) 667 MG PO CAPS
667.0000 mg | ORAL_CAPSULE | Freq: Three times a day (TID) | ORAL | Status: DC
  Administered 2019-03-16 (×2): 667 mg via ORAL
  Filled 2019-03-15 (×4): qty 1

## 2019-03-15 MED ORDER — ACETAMINOPHEN 325 MG PO TABS
650.0000 mg | ORAL_TABLET | Freq: Four times a day (QID) | ORAL | Status: DC | PRN
  Administered 2019-03-16 – 2019-03-19 (×2): 650 mg via ORAL
  Filled 2019-03-15 (×2): qty 2

## 2019-03-15 MED ORDER — SODIUM CHLORIDE 0.9 % IV BOLUS
500.0000 mL | Freq: Once | INTRAVENOUS | Status: AC
  Administered 2019-03-15: 500 mL via INTRAVENOUS

## 2019-03-15 MED ORDER — NEPRO/CARBSTEADY PO LIQD: 237.0000 mL | Freq: Three times a day (TID) | ORAL | Status: DC | PRN

## 2019-03-15 MED ORDER — AMLODIPINE BESYLATE 5 MG PO TABS
5.0000 mg | ORAL_TABLET | Freq: Every day | ORAL | Status: DC
  Administered 2019-03-16 – 2019-04-03 (×17): 5 mg via ORAL
  Filled 2019-03-15 (×18): qty 1

## 2019-03-15 MED ORDER — PANTOPRAZOLE SODIUM 40 MG PO TBEC
40.0000 mg | DELAYED_RELEASE_TABLET | Freq: Every day | ORAL | Status: DC
  Administered 2019-03-16 – 2019-03-22 (×6): 40 mg via ORAL
  Filled 2019-03-15 (×7): qty 1

## 2019-03-15 MED ORDER — ONDANSETRON HCL 4 MG PO TABS: 4.0000 mg | ORAL_TABLET | Freq: Four times a day (QID) | ORAL | Status: DC | PRN

## 2019-03-15 MED ORDER — ONDANSETRON HCL 4 MG/2ML IJ SOLN
4.0000 mg | Freq: Once | INTRAMUSCULAR | Status: AC
  Administered 2019-03-15: 4 mg via INTRAVENOUS
  Filled 2019-03-15: qty 2

## 2019-03-15 MED ORDER — INSULIN ASPART 100 UNIT/ML ~~LOC~~ SOLN
0.0000 [IU] | Freq: Three times a day (TID) | SUBCUTANEOUS | Status: DC
  Administered 2019-03-16: 3 [IU] via SUBCUTANEOUS
  Administered 2019-03-16: 2 [IU] via SUBCUTANEOUS
  Administered 2019-03-17 (×2): 1 [IU] via SUBCUTANEOUS
  Administered 2019-03-18 (×2): 2 [IU] via SUBCUTANEOUS
  Administered 2019-03-19 – 2019-03-21 (×3): 1 [IU] via SUBCUTANEOUS
  Administered 2019-03-22 (×3): 3 [IU] via SUBCUTANEOUS
  Administered 2019-03-23: 2 [IU] via SUBCUTANEOUS
  Administered 2019-03-23: 3 [IU] via SUBCUTANEOUS
  Administered 2019-03-25: 1 [IU] via SUBCUTANEOUS
  Administered 2019-03-25 – 2019-03-26 (×3): 2 [IU] via SUBCUTANEOUS
  Administered 2019-03-26 – 2019-03-27 (×3): 1 [IU] via SUBCUTANEOUS
  Administered 2019-03-27 – 2019-03-28 (×2): 2 [IU] via SUBCUTANEOUS
  Administered 2019-03-28: 3 [IU] via SUBCUTANEOUS
  Administered 2019-03-29: 1 [IU] via SUBCUTANEOUS
  Administered 2019-03-29: 2 [IU] via SUBCUTANEOUS
  Administered 2019-03-30 (×2): 1 [IU] via SUBCUTANEOUS
  Administered 2019-03-30 – 2019-04-01 (×4): 2 [IU] via SUBCUTANEOUS
  Administered 2019-04-01: 3 [IU] via SUBCUTANEOUS
  Administered 2019-04-02 (×2): 2 [IU] via SUBCUTANEOUS
  Administered 2019-04-03: 3 [IU] via SUBCUTANEOUS
  Administered 2019-04-03: 1 [IU] via SUBCUTANEOUS

## 2019-03-15 MED ORDER — ACETAMINOPHEN 650 MG RE SUPP: 650.0000 mg | Freq: Four times a day (QID) | RECTAL | Status: DC | PRN

## 2019-03-15 MED ORDER — HYDROXYZINE HCL 25 MG PO TABS: 25.0000 mg | ORAL_TABLET | Freq: Three times a day (TID) | ORAL | Status: DC | PRN

## 2019-03-15 MED ORDER — GABAPENTIN 300 MG PO CAPS
300.0000 mg | ORAL_CAPSULE | Freq: Three times a day (TID) | ORAL | Status: DC
  Administered 2019-03-15 – 2019-03-25 (×25): 300 mg via ORAL
  Filled 2019-03-15 (×11): qty 1
  Filled 2019-03-15: qty 3
  Filled 2019-03-15 (×13): qty 1

## 2019-03-15 MED ORDER — ONDANSETRON HCL 4 MG/2ML IJ SOLN
4.0000 mg | Freq: Four times a day (QID) | INTRAMUSCULAR | Status: DC | PRN
  Administered 2019-03-15 – 2019-03-26 (×4): 4 mg via INTRAVENOUS
  Filled 2019-03-15 (×4): qty 2

## 2019-03-15 MED ORDER — HYDROMORPHONE HCL 1 MG/ML IJ SOLN
1.0000 mg | Freq: Once | INTRAMUSCULAR | Status: AC
  Administered 2019-03-15: 12:00:00 1 mg via INTRAVENOUS
  Filled 2019-03-15: qty 1

## 2019-03-15 MED ORDER — PIPERACILLIN-TAZOBACTAM IN DEX 2-0.25 GM/50ML IV SOLN
2.2500 g | Freq: Three times a day (TID) | INTRAVENOUS | Status: AC
  Administered 2019-03-16 – 2019-03-25 (×26): 2.25 g via INTRAVENOUS
  Filled 2019-03-15 (×31): qty 50

## 2019-03-15 MED ORDER — CAMPHOR-MENTHOL 0.5-0.5 % EX LOTN
1.0000 "application " | TOPICAL_LOTION | Freq: Three times a day (TID) | CUTANEOUS | Status: DC | PRN
  Filled 2019-03-15: qty 222

## 2019-03-15 MED ORDER — FOLIC ACID 1 MG PO TABS
1.0000 mg | ORAL_TABLET | Freq: Every day | ORAL | Status: DC
  Administered 2019-03-16 – 2019-04-03 (×17): 1 mg via ORAL
  Filled 2019-03-15 (×18): qty 1

## 2019-03-15 MED ORDER — ZOLPIDEM TARTRATE 5 MG PO TABS: 5.0000 mg | ORAL_TABLET | Freq: Every evening | ORAL | Status: DC | PRN

## 2019-03-15 MED ORDER — SORBITOL 70 % SOLN
30.0000 mL | Status: DC | PRN
  Filled 2019-03-15: qty 30

## 2019-03-15 MED ORDER — PIPERACILLIN-TAZOBACTAM 3.375 G IVPB
3.3750 g | Freq: Once | INTRAVENOUS | Status: AC
  Administered 2019-03-15: 3.375 g via INTRAVENOUS
  Filled 2019-03-15: qty 50

## 2019-03-15 MED ORDER — SODIUM CHLORIDE 0.9% FLUSH: 3.0000 mL | Freq: Once | INTRAVENOUS | Status: DC

## 2019-03-15 MED ORDER — ATORVASTATIN CALCIUM 10 MG PO TABS
20.0000 mg | ORAL_TABLET | Freq: Every day | ORAL | Status: DC
  Administered 2019-03-16 – 2019-04-03 (×17): 20 mg via ORAL
  Filled 2019-03-15 (×18): qty 2

## 2019-03-15 MED ORDER — DOCUSATE SODIUM 100 MG PO CAPS
100.0000 mg | ORAL_CAPSULE | Freq: Two times a day (BID) | ORAL | Status: DC
  Administered 2019-03-15 – 2019-03-22 (×10): 100 mg via ORAL
  Filled 2019-03-15 (×12): qty 1

## 2019-03-15 MED ORDER — FERROUS SULFATE 325 (65 FE) MG PO TABS
325.0000 mg | ORAL_TABLET | Freq: Two times a day (BID) | ORAL | Status: DC
  Administered 2019-03-15 – 2019-03-16 (×2): 325 mg via ORAL
  Filled 2019-03-15 (×2): qty 1

## 2019-03-15 NOTE — H&P (Addendum)
History and Physical    [name] EGB:[id number] DOB: 11/11/1950 DOA: 03/15/2019  PCP: Nolene Ebbs, MD Consultants:  Redmond Baseman - ENT; Brabham - vascular; Hilty - cardiology; Colodonato - nephrology Patient coming from:  Home - lives alone; NOK: [place], [place], [phone]  Chief Complaint: N/V  HPI: [name] is a 68 y.o. male with medical history significant of sickle cell anemia (SS disease); ESRD not on HD; HTN; HLD; DM; and colon CA presenting with n/v.  He had a vocal cord lesion removed with CO2 laser excision during microlaryngoscopy on 10/7.  He went home and then started vomiting the evening of 10.7.  No vomiting prior to surgery.  It would come and go.  He has been losing a lot of weight and so has been drinking Boost.  Unintentional weight loss of about 50 pounds in the last 4-5 months.  He started having night sweats recently.  No LAD.  Slight diarrhea after the surgery, thought it was related to the anesthesia.  Normal BMs other than post-operatively.  Stools have been black because he has been taking iron.  He got 4 units of blood a week or two before the surgery, that increased his Hgb from 2.5 up to 8.9.  He has not yet started HD, still making urine.  His ex-wife has been caring for him for the last 3 weeks.  His colon cancer was 20 years ago.  He has never had a colonoscopy in over 25 years.  He was last admitted 8/13-15 for symptomatic anemia from CKD and given IV iron and Epogen.   ED Course:   C/o abdominal pain and vomiting.  Had laryngeal biopsy a few days ago.  WBC 40k.  CT read as progression of colon CA at cecum and TI with ?typhlitis.  Dr. Georgette Dover will see but he needs admission, antibiotics, likely GI consult.  Will also need nephrology consult for HD.  Review of Systems: As per HPI; otherwise review of systems reviewed and negative.   Ambulatory Status:  Ambulates without assistance or with a cane for balance  Past Medical History:  Diagnosis  Date   Arthritis    Cancer ([place])    colon   Cataracts, bilateral    surgery to remove   Diabetes mellitus    type 2   GERD (gastroesophageal reflux disease)    Heart murmur    History of kidney stones    passed stone, no surgery   Hyperlipidemia    Hypertension    Lesion of vocal cord    Pneumonia 2019   x 2   Renal insufficiency    no dialysis - stagte 5   Sickle cell anemia (HCC)    Hgb SS disease    Wears glasses     Past Surgical History:  Procedure Laterality Date   AV FISTULA PLACEMENT Left 11/22/2016   Procedure: INSERTION OF ARTERIOVENOUS (AV) GORE-TEX GRAFT  LEFT FOREARM USING 6MM X 50CM GORETEX GRAFT;  Surgeon: Serafina Mitchell, MD;  Location: [facility] OR;  Service: Vascular;  Laterality: Left;   CATARACT EXTRACTION W/ INTRAOCULAR LENS  IMPLANT, BILATERAL     CHOLECYSTECTOMY     CO2 LASER APPLICATION N/A 54/11/2701   Procedure: CO2 LASER EXCISION OF VOCAL FOLD LESION;  Surgeon: Melida Quitter, MD;  Location: [facility];  Service: ENT;  Laterality: N/A;   COLON SURGERY     Colon cancer surgery, removed small amt not full colectomy   COLONOSCOPY     nclear  who follows him for this   DIRECT LARYNGOSCOPY N/A 03/12/2019   Procedure: SUSPENDED MICRO DIRECT LARYNGOSCOPY;  Surgeon: Melida Quitter, MD;  Location: [facility] OR;  Service: ENT;  Laterality: N/A;   LEG SURGERY     For ? gangrene after running into barbwire fence at 68yo   MULTIPLE TOOTH EXTRACTIONS     no dentures    Social History   Socioeconomic History   Marital status: Divorced    Spouse name: Not on file   Number of children: Not on file   Years of education: Not on file   Highest education level: Not on file  Occupational History   Occupation: retired  Scientist, product/process development strain: Not on file   Food insecurity    Worry: Not on file    Inability: Not on Lexicographer needs    Medical: Not on file    Non-medical: Not on file  Tobacco Use   Smoking status:  Former Smoker    Packs/day: 0.50    Years: 20.00    Pack years: 10.00    Types: Cigarettes    Quit date: 06/05/1966    Years since quitting: 52.8   Smokeless tobacco: Never Used  Substance and Sexual Activity   Alcohol use: Yes    Comment: rare beer, last use maybe July   Drug use: Not Currently    Types: Cocaine, "Crack" cocaine    Comment: Last use July 2020   Sexual activity: Not on file  Lifestyle   Physical activity    Days per week: Not on file    Minutes per session: Not on file   Stress: Not on file  Relationships   Social connections    Talks on phone: Not on file    Gets together: Not on file    Attends religious service: Not on file    Active member of club or organization: Not on file    Attends meetings of clubs or organizations: Not on file    Relationship status: Not on file   Intimate partner violence    Fear of current or ex partner: Not on file    Emotionally abused: Not on file    Physically abused: Not on file    Forced sexual activity: Not on file  Other Topics Concern   Not on file  Social History Narrative   Retired Administrator, on disability. Former smoker. Living by himself, able to get around [facility].    Has a wife who checks on him but they don't live together sicne 2007   Quit drinking     Allergies  Allergen Reactions   Morphine And Related Itching    Family History  Problem Relation Age of Onset   Venous thrombosis Mother        Dec. of blood clot    Diabetes type II Mother    AAA (abdominal aortic aneurysm) Mother    Throat cancer Father        Deceased of throat cancer    Prior to Admission medications   Medication Sig Start Date End Date Taking? Authorizing Provider  acetaminophen (TYLENOL) 500 MG tablet Take 500 mg by mouth every 6 (six) hours as needed for headache (pain).    [provider]  amLODipine (NORVASC) 5 MG tablet Take 5 mg by mouth daily. 10/04/17   [provider]  atorvastatin  (LIPITOR) 20 MG tablet Take 20 mg by mouth daily.  02/01/18   [provider]  calcitRIOL (ROCALTROL) 0.25 MCG capsule Take 0.25 mcg by mouth every other day. In the morning. 04/25/18   [provider]  calcium acetate (PHOSLO) 667 MG capsule Take 667 mg by mouth 3 (three) times daily with meals.    [provider]  carvedilol (COREG) 25 MG tablet Take 25 mg by mouth 2 (two) times daily. 09/04/17   [provider]  ferrous sulfate (FEOSOL) 325 (65 FE) MG tablet Take 1 tablet (325 mg total) by mouth 2 (two) times daily with a meal. Patient taking differently: Take 325 mg by mouth 2 (two) times daily.  01/18/19   Monica Becton, MD  folic acid (FOLVITE) 458 MCG tablet Take 800 mcg by mouth daily.    [provider]  furosemide (LASIX) 40 MG tablet Take 40 mg by mouth 2 (two) times daily. Morning & 1400    [provider]  gabapentin (NEURONTIN) 300 MG capsule Take 1 capsule (300 mg total) by mouth at bedtime. Patient taking differently: Take 300 mg by mouth 3 (three) times daily.  01/18/19   Monica Becton, MD  insulin aspart (NOVOLOG FLEXPEN) 100 UNIT/ML FlexPen Inject 2-6 Units into the skin 3 (three) times daily as needed for high blood sugar (CBG >150).    [provider]  omeprazole (PRILOSEC) 20 MG capsule Take 20 mg by mouth daily.     [provider]  ONGLYZA 5 MG TABS tablet Take 5 mg by mouth daily.  06/21/18   [provider]  polyethylene glycol powder (MIRALAX) powder Take 17 g by mouth daily. Patient taking differently: Take 17 g by mouth daily as needed (constipation). Mix in 8 oz liquid and drink 10/28/16   Palumbo, April, MD  VENTOLIN HFA 108 (90 Base) MCG/ACT inhaler Inhale 2 puffs into the lungs every 6 (six) hours as needed for wheezing.  09/03/17   [provider]    Physical Exam: Vitals:   03/15/19 1500 03/15/19 1515 03/15/19 1530 03/15/19 1600  BP: 139/86  (!) 147/73 (!) 152/77  Pulse:   78 77   Resp: 16 (!) 23 15 (!) 22  Temp:      TempSrc:      SpO2:  97% 97%       General:  Appears calm and comfortable and is NAD; he is cachectic  Eyes:  PERRL, EOMI, normal lids, iris  ENT:  grossly normal hearing, lips & tongue, mmm; mostly edentulous  Neck:  no LAD, masses or thyromegaly  Cardiovascular:  RRR, no m/r/g. No LE edema.   Respiratory:   CTA bilaterally with no wheezes/rales/rhonchi.  Normal respiratory effort.  Abdomen:  Guarding and significant RLQ TTP, hyperactive B LQ BS  Skin:  no rash or induration seen on limited exam  Musculoskeletal:  grossly normal tone BUE/BLE, good ROM, no bony abnormality  Psychiatric:  Blunted mood and affect, speech fluent and appropriate, AOx3  Neurologic:  CN 2-12 grossly intact, moves all extremities in coordinated fashion, sensation intact    Radiological Exams on Admission: Ct Abdomen Pelvis Wo Contrast  Result Date: 03/15/2019 CLINICAL DATA:  Emesis, right lower quadrant pain, history of colon carcinoma EXAM: CT ABDOMEN AND PELVIS WITHOUT CONTRAST TECHNIQUE: Multidetector CT imaging of the abdomen and pelvis was performed following the standard protocol without IV contrast. COMPARISON:  02/09/2018 and previous FINDINGS: Lower chest: Linear scarring or subsegmental atelectasis posteriorly in the lower lobes as before. No pleural or pericardial effusion. Hepatobiliary: No focal liver abnormality is seen. Status  post cholecystectomy. No biliary dilatation. Pancreas: Unremarkable. No pancreatic ductal dilatation or surrounding inflammatory changes. Spleen: Small with coarse calcifications as before. Adrenals/Urinary Tract: Adrenal glands are unremarkable. Kidneys are normal, without renal calculi, focal lesion, or hydronephrosis. Bladder is unremarkable. Stomach/Bowel: Stomach decompressed. Small bowel decompressed. Marked wall thickening involving the terminal ileum and cecum without obstruction. Mild adjacent  inflammatory/edematous changes. Regional adenopathy measured up to 3.2 cm , some nodes containing calcifications. The remainder of the colon is unremarkable. Vascular/Lymphatic: Right lower quadrant and central mesenteric adenopathy as above. No retroperitoneal or pelvic adenopathy. No significant vascular pathology identified. Reproductive: Uterus and bilateral adnexa are unremarkable. Other: Bilateral pelvic phleboliths.  No ascites.  No free air. Musculoskeletal: Extensive mottled lytic/sclerotic appearance of all visualized bones, probably related to sickle cell disease, present on studies dating back to 11/04/2016. No fracture. IMPRESSION: 1. Marked progression of wall thickening in the cecum, now also involving terminal ileum, with progressive regional adenopathy, suggesting progression of colon carcinoma. Cannot exclude superimposed typhlitis given the clinical presentation and regional inflammatory/edematous change. 2. Chronic changes of sickle cell disease in visualized bones and spleen. Electronically Signed   By: Lucrezia Europe M.D.   On: 03/15/2019 13:59    EKG:  Not done   Labs on Admission: I have personally reviewed the available labs and imaging studies at the time of the admission.  Pertinent labs:   Na++ 133 CO2 21 Glucose 160 BUN 56/Creatinine 3.24/GFR 21 Albumin 2.5 WBC 39.5; 34.2 on 10/7; 23.9 on 8/13 Hgb 7.0; 6.6 on 10/7; 3.8 on 8/13 Platelets 981; 782 on 10/7; 567 on 8/13 Surgical path from 10/7 densely inflamed papilloma, no apparent malignancy   Assessment/Plan Principal Problem:   Colon cancer ([place]) Active Problems:   Hypertension   Type 2 diabetes mellitus with renal complication (HCC)   CKD (chronic kidney disease) stage 5, GFR less than 15 ml/min (HCC)   Anemia   Colon cancer -Patient with remote h/o colon cancer (records not apparently available) without subsequent follow-up, ex-wife reports no colonoscopy in 25 years -Presented with RLQ abdominal pain, and  n/v; also with 50 pounds unintentional weight loss in the last 4-5 months and recent night sweats -Imaging indicates recurrent colon cancer in the cecum/TI -Patient discussed with Dr. Lyndel Safe, who will consult on the patient -This appears to be at least stage 3 with positive LN, No obstruction on CT -Will check CEA level -Clear liquids, NPO after MN in anticipation of possible C-scope tomorrow -Surgery has also been consulted -Evidence of typhlitis on CT- treat with Zosyn for now.  Stage 5 CKD -Patient has been preparing for HD but has not yet started -Nephrology prn order set utilized -He does not appear to be volume overloaded or otherwise in need of acute HD -Nephrology has been notified of admission -Continue Phoslo, Calcitriol  Anemia -Likely multifactorial including h/o sickle cell disease and stage 5 CKD -Hgb was down to 3.8 on 8/13; this was thought to be related to CKD and he was transfused 4 units, but it now appears more likely that this was also related to occult GI bleeding in the setting of recurrent colon cancer (although he was heme negative on 8/13) -No transfusion for now, but he may benefit from transfusion during this hospitalization - particularly if he is continuing to downtrend. -Will check guaiac, as well -Recheck CBC in AM  HTN -Continue Norvasc, Coreg  Chronic diastolic CHF -28/3151 echo with preserved EF and grade 1 diastolic dysfunction -Despite stage 5 CKD, he  does not appear to be volume overloaded at this time. -If he becomes volume overloaded, the likely treatment would be HD.  DM -Will check A1c -hold Onglyza -Cover with sensitive-scale SSI  HLD  -Continue Lipitor  Cocaine abuse -Patient denies use since July, but was positive at the time of last admission in August. -Will check UDS.   Note: This patient has been tested and is pending for the novel coronavirus COVID-19.  DVT prophylaxis: SCDs Code Status:  DNR - confirmed with  patient Family Communication: None present; I spoke with his ex-wife by telephone Disposition Plan:  Home once clinically improved Consults called: GI; surgery; nephrology  Admission status: Admit - It is my clinical opinion that admission to INPATIENT is reasonable and necessary because of the expectation that this patient will require hospital care that crosses at least 2 midnights to treat this condition based on the medical complexity of the problems presented.  Given the aforementioned information, the predictability of an adverse outcome is felt to be significant.    Karmen Bongo MD [facility]   How to contact the [facility] Attending or Consulting provider Person or covering provider during after hours [place], for this patient?  1. Check the care team in [facility] and look for a) attending/consulting [facility] provider listed and b) the [facility] team listed 2. Log into [email or web address] and use [place]'s universal password to access. If you do not have the password, please contact the hospital operator. 3. Locate the [facility] provider you are looking for under [facility] and page to a number that you can be directly reached. 4. If you still have difficulty reaching the provider, please page the [facility] (Director on Call) for the Hospitalists listed on amion for assistance.   03/15/2019, 4:39 PM

## 2019-03-15 NOTE — ED Triage Notes (Signed)
Pt. Stated, Im also having some soreness on the right side of my stomach and a knot I guess form throwing up.

## 2019-03-15 NOTE — Consult Note (Signed)
Reason for Consult:RLQ pain Referring Physician: Lorin Mercy, MD - [facility]  [name] is an 68 y.o. male.  HPI: This is a 68 year old male with significant PMH who is s/p recent laryngeal biopsy on 10/7 by Dr. Redmond Baseman (benign) presents with nausea, vomiting and right lower quadrant abdominal pain.  The patient reports recent unintentional weight loss of about 50 lbs in the last few months.  He admits to some night sweats.  He has had some diarrhea the last couple of days.  He is on iron supplements for anemia and his stools have been black.   The patient has sickle cell anemia and has received several recent blood transfusions for hgb 2.5.  He reportedly had colon cancer in 1982 and had some type of colon resection, but those records are unavailable and he does not remember exactly what was done.  He has not had a colonoscopy in over 25 years.    The patient has developed ESRD but is not yet on HD.    He presented to the ED for evaluation.  WBC is markedly elevated, although it was 34.2 on the date of surgery 03/12/19.  His Hgb is 7.0.  CT shows marked thickening of the cecum and TI with regional lymphadenopathy.    Past Medical History:  Diagnosis Date  . Arthritis   . Cancer ([place])    colon  . Cataracts, bilateral    surgery to remove  . Diabetes mellitus    type 2  . GERD (gastroesophageal reflux disease)   . Heart murmur   . History of kidney stones    passed stone, no surgery  . Hyperlipidemia   . Hypertension   . Lesion of vocal cord   . Pneumonia 2019   x 2  . Renal insufficiency    no dialysis - stagte 5  . Sickle cell anemia ([facility])    Hgb SS disease   . Wears glasses     Past Surgical History:  Procedure Laterality Date  . AV FISTULA PLACEMENT Left 11/22/2016   Procedure: INSERTION OF ARTERIOVENOUS (AV) GORE-TEX GRAFT  LEFT FOREARM USING 6MM X 50CM GORETEX GRAFT;  Surgeon: Serafina Mitchell, MD;  Location: [facility];  Service: Vascular;  Laterality: Left;  . CATARACT EXTRACTION W/  INTRAOCULAR LENS  IMPLANT, BILATERAL    . CHOLECYSTECTOMY    . CO2 LASER APPLICATION N/A 66/0/6301   Procedure: CO2 LASER EXCISION OF VOCAL FOLD LESION;  Surgeon: Melida Quitter, MD;  Location: [facility];  Service: ENT;  Laterality: N/A;  . COLON SURGERY     Colon cancer surgery, removed small amt not full colectomy  . COLONOSCOPY     nclear who follows him for this  . DIRECT LARYNGOSCOPY N/A 03/12/2019   Procedure: SUSPENDED MICRO DIRECT LARYNGOSCOPY;  Surgeon: Melida Quitter, MD;  Location: [facility];  Service: ENT;  Laterality: N/A;  . LEG SURGERY     For ? gangrene after running into barbwire fence at 68yo  . MULTIPLE TOOTH EXTRACTIONS     no dentures    Family History  Problem Relation Age of Onset  . Venous thrombosis Mother        Dec. of blood clot   . Diabetes type II Mother   . AAA (abdominal aortic aneurysm) Mother   . Throat cancer Father        Deceased of throat cancer    Social History:  reports that he quit smoking about 52 years ago. His smoking use  included cigarettes. He has a 10.00 pack-year smoking history. He has never used smokeless tobacco. He reports current alcohol use. He reports previous drug use. Drugs: Cocaine and "Crack" cocaine.  Allergies:  Allergies  Allergen Reactions  . Morphine And Related Itching    Medications:  Prior to Admission medications   Medication Sig Start Date End Date Taking? Authorizing Provider  acetaminophen (TYLENOL) 500 MG tablet Take 500 mg by mouth every 6 (six) hours as needed for headache (pain).   Yes [provider]  amLODipine (NORVASC) 5 MG tablet Take 5 mg by mouth daily. 10/04/17  Yes [provider]  atorvastatin (LIPITOR) 20 MG tablet Take 20 mg by mouth daily.  02/01/18  Yes [provider]  calcitRIOL (ROCALTROL) 0.25 MCG capsule Take 0.25 mcg by mouth every other day. In the morning. 04/25/18  Yes [provider]  calcium acetate (PHOSLO) 667 MG capsule Take 667 mg by mouth 3 (three)  times daily with meals.   Yes [provider]  carvedilol (COREG) 25 MG tablet Take 25 mg by mouth 2 (two) times daily. 09/04/17  Yes [provider]  ferrous sulfate (FEOSOL) 325 (65 FE) MG tablet Take 1 tablet (325 mg total) by mouth 2 (two) times daily with a meal. Patient taking differently: Take 325 mg by mouth 2 (two) times daily.  01/18/19  Yes Vasireddy, Grier Mitts, MD  folic acid (FOLVITE) 154 MCG tablet Take 800 mcg by mouth daily.   Yes [provider]  furosemide (LASIX) 40 MG tablet Take 40 mg by mouth See admin instructions. Morning & 1400    Yes [provider]  gabapentin (NEURONTIN) 300 MG capsule Take 1 capsule (300 mg total) by mouth at bedtime. Patient taking differently: Take 300 mg by mouth 3 (three) times daily.  01/18/19  Yes Vasireddy, Grier Mitts, MD  insulin aspart (NOVOLOG FLEXPEN) 100 UNIT/ML FlexPen Inject 2-6 Units into the skin 3 (three) times daily as needed for high blood sugar (CBG >150).   Yes [provider]  omeprazole (PRILOSEC) 20 MG capsule Take 20 mg by mouth daily.    Yes [provider]  ONGLYZA 5 MG TABS tablet Take 5 mg by mouth daily.  06/21/18  Yes [provider]  polyethylene glycol powder (MIRALAX) powder Take 17 g by mouth daily. Patient taking differently: Take 17 g by mouth daily as needed (constipation). Mix in 8 oz liquid and drink 10/28/16  Yes Palumbo, April, MD  VENTOLIN HFA 108 (90 Base) MCG/ACT inhaler Inhale 2 puffs into the lungs every 6 (six) hours as needed for wheezing.  09/03/17  Yes [provider]     Results for orders placed or performed during the hospital encounter of 03/15/19 (from the past 48 hour(s))  CBC     Status: Abnormal   Collection Time: 03/15/19 11:54 AM  Result Value Ref Range   WBC 39.5 (H) 4.0 - 10.5 K/uL   RBC 3.21 (L) 4.22 - 5.81 MIL/uL   Hemoglobin 7.0 (L) 13.0 - 17.0 g/dL    Comment: Reticulocyte Hemoglobin testing may be clinically  indicated, consider ordering this additional test [id number]    HCT 22.6 (L) 39.0 - 52.0 %   MCV 70.4 (L) 80.0 - 100.0 fL   MCH 21.8 (L) 26.0 - 34.0 pg   MCHC 31.0 30.0 - 36.0 g/dL   RDW 26.7 (H) 11.5 - 15.5 %   Platelets 981 (HH) 150 - 400 K/uL    Comment: REPEATED TO VERIFY PLATELET  COUNT CONFIRMED BY SMEAR THIS CRITICAL RESULT HAS VERIFIED AND BEEN CALLED TO E.BANKS,RN BY JESSICA WEBBER ON 10 10 2020 AT 21, AND HAS BEEN READ BACK.     nRBC 0.5 (H) 0.0 - 0.2 %    Comment: Performed at [facility], [place] [address] [place], [place] [address]  Comprehensive metabolic panel     Status: Abnormal   Collection Time: 03/15/19 12:04 PM  Result Value Ref Range   Sodium 133 (L) 135 - 145 mmol/L   Potassium 5.1 3.5 - 5.1 mmol/L   Chloride 101 98 - 111 mmol/L   CO2 21 (L) 22 - 32 mmol/L   Glucose, Bld 160 (H) 70 - 99 mg/dL   BUN 56 (H) 8 - 23 mg/dL   Creatinine, Ser 3.24 (H) 0.61 - 1.24 mg/dL   Calcium 9.3 8.9 - 10.3 mg/dL   Total Protein 7.9 6.5 - 8.1 g/dL   Albumin 2.5 (L) 3.5 - 5.0 g/dL   AST 16 15 - 41 U/L   ALT 10 0 - 44 U/L   Alkaline Phosphatase 85 38 - 126 U/L   Total Bilirubin 0.5 0.3 - 1.2 mg/dL   GFR calc non Af Amer 18 (L) >60 mL/min   GFR calc Af Amer 21 (L) >60 mL/min   Anion gap 11 5 - 15    Comment: Performed at [facility], [place] [address] [place], [place] [address]  Lipase, blood     Status: None   Collection Time: 03/15/19 12:04 PM  Result Value Ref Range   Lipase 51 11 - 51 U/L    Comment: Performed at [facility] [address] [place], [place] [address]    Ct Abdomen Pelvis Wo Contrast  Result Date: 03/15/2019 CLINICAL DATA:  Emesis, right lower quadrant pain, history of colon carcinoma EXAM: CT ABDOMEN AND PELVIS WITHOUT CONTRAST TECHNIQUE: Multidetector CT imaging of the abdomen and pelvis was performed following the standard protocol without IV contrast. COMPARISON:  02/09/2018 and previous FINDINGS: Lower chest: Linear scarring or  subsegmental atelectasis posteriorly in the lower lobes as before. No pleural or pericardial effusion. Hepatobiliary: No focal liver abnormality is seen. Status post cholecystectomy. No biliary dilatation. Pancreas: Unremarkable. No pancreatic ductal dilatation or surrounding inflammatory changes. Spleen: Small with coarse calcifications as before. Adrenals/Urinary Tract: Adrenal glands are unremarkable. Kidneys are normal, without renal calculi, focal lesion, or hydronephrosis. Bladder is unremarkable. Stomach/Bowel: Stomach decompressed. Small bowel decompressed. Marked wall thickening involving the terminal ileum and cecum without obstruction. Mild adjacent inflammatory/edematous changes. Regional adenopathy measured up to 3.2 cm , some nodes containing calcifications. The remainder of the colon is unremarkable. Vascular/Lymphatic: Right lower quadrant and central mesenteric adenopathy as above. No retroperitoneal or pelvic adenopathy. No significant vascular pathology identified. Reproductive: Uterus and bilateral adnexa are unremarkable. Other: Bilateral pelvic phleboliths.  No ascites.  No free air. Musculoskeletal: Extensive mottled lytic/sclerotic appearance of all visualized bones, probably related to sickle cell disease, present on studies dating back to 11/04/2016. No fracture. IMPRESSION: 1. Marked progression of wall thickening in the cecum, now also involving terminal ileum, with progressive regional adenopathy, suggesting progression of colon carcinoma. Cannot exclude superimposed typhlitis given the clinical presentation and regional inflammatory/edematous change. 2. Chronic changes of sickle cell disease in visualized bones and spleen. Electronically Signed   By: Lucrezia Europe M.D.   On: 03/15/2019 13:59    Review of Systems  Constitutional: Positive for weight loss.  HENT: Positive for sore throat. Negative  for ear discharge, ear pain, hearing loss and tinnitus.   Eyes: Negative for blurred  vision, double vision, photophobia and pain.  Respiratory: Negative for cough, sputum production and shortness of breath.   Cardiovascular: Negative for chest pain.  Gastrointestinal: Positive for abdominal pain, diarrhea, melena, nausea and vomiting.  Genitourinary: Negative for dysuria, flank pain, frequency and urgency.  Musculoskeletal: Negative for back pain, falls, joint pain, myalgias and neck pain.  Neurological: Positive for weakness. Negative for dizziness, tingling, sensory change, focal weakness, loss of consciousness and headaches.  Endo/Heme/Allergies: Does not bruise/bleed easily.  Psychiatric/Behavioral: Negative for depression, memory loss and substance abuse. The patient is not nervous/anxious.    Blood pressure (!) 152/77, pulse 77, temperature 99.5 F (37.5 C), temperature source Oral, resp. rate (!) 22, SpO2 97 %. Physical Exam WDWN in NAD Eyes:  Pupils equal, round; sclera anicteric HENT:  Oral mucosa moist; good dentition  Neck:  No masses palpated, no thyromegaly Lungs:  CTA bilaterally; normal respiratory effort CV:  Regular rate and rhythm; no murmurs; extremities well-perfused with no edema Abd:  +bowel sounds, soft, mildly tender in RUQ/ RLQ.  Healed R subcostal incision Skin:  Warm, dry; no sign of jaundice Psychiatric - alert and oriented x 4; calm mood and affect  Assessment/Plan: Right colon thickening Severe anemia Suspicious for metastatic colon cancer  Rec:  Transfusion as needed GI evaluation for colonoscopy   Maia Petties 03/15/2019, 4:37 PM

## 2019-03-15 NOTE — ED Triage Notes (Signed)
Pt. Stated, I had surgery on my throat took out a cyst or something . 2 days ago I started throwing up, and unable to keep nothing on my stomach.

## 2019-03-15 NOTE — ED Notes (Signed)
ED TO INPATIENT HANDOFF REPORT  ED Nurse Name and Phone #: Shaira Sova  S Name/Age/Gender [name] 68 y.o. male Room/Bed: 014C/014C  Code Status   Code Status: DNR  Home/SNF/Other Home Patient oriented to: self, place and situation Is this baseline? Yes   Triage Complete: Triage complete  Chief Complaint Post op problem-emesis  Triage Note Pt. Stated, I had surgery on my throat took out a cyst or something . 2 days ago I started throwing up, and unable to keep nothing on my stomach.  Pt. Stated, Im also having some soreness on the right side of my stomach and a knot I guess form throwing up.  Pt wife updated with Pt status.   Allergies Allergies  Allergen Reactions  . Morphine And Related Itching    Level of Care/Admitting Diagnosis ED Disposition    ED Disposition Condition [facility] Area: [place] [[id number]]  Level of Care: Med-Surg [16]  Covid Evaluation: Asymptomatic Screening Protocol (No Symptoms)  Diagnosis: Colon cancer [facility]) [[id number]]  Admitting Physician: Karmen Bongo [[id number]]  Attending Physician: Karmen Bongo [[id number]]  Estimated length of stay: past midnight tomorrow  Certification:: I certify this patient will need inpatient services for at least 2 midnights  PT Class (Do Not Modify): Inpatient [101]  PT Acc Code (Do Not Modify): Private [1]       B Medical/Surgery History Past Medical History:  Diagnosis Date  . Arthritis   . Cancer ([place])    colon  . Cataracts, bilateral    surgery to remove  . Diabetes mellitus    type 2  . GERD (gastroesophageal reflux disease)   . Heart murmur   . History of kidney stones    passed stone, no surgery  . Hyperlipidemia   . Hypertension   . Lesion of vocal cord   . Pneumonia 2019   x 2  . Renal insufficiency    no dialysis - stagte 5  . Sickle cell anemia (HCC)    Hgb SS disease   . Wears glasses    Past Surgical History:  Procedure Laterality Date  .  AV FISTULA PLACEMENT Left 11/22/2016   Procedure: INSERTION OF ARTERIOVENOUS (AV) GORE-TEX GRAFT  LEFT FOREARM USING 6MM X 50CM GORETEX GRAFT;  Surgeon: Serafina Mitchell, MD;  Location: [place];  Service: Vascular;  Laterality: Left;  . CATARACT EXTRACTION W/ INTRAOCULAR LENS  IMPLANT, BILATERAL    . CHOLECYSTECTOMY    . CO2 LASER APPLICATION N/A 76/07/8313   Procedure: CO2 LASER EXCISION OF VOCAL FOLD LESION;  Surgeon: Melida Quitter, MD;  Location: [place];  Service: ENT;  Laterality: N/A;  . COLON SURGERY     Colon cancer surgery, removed small amt not full colectomy  . COLONOSCOPY     nclear who follows him for this  . DIRECT LARYNGOSCOPY N/A 03/12/2019   Procedure: SUSPENDED MICRO DIRECT LARYNGOSCOPY;  Surgeon: Melida Quitter, MD;  Location: [place];  Service: ENT;  Laterality: N/A;  . LEG SURGERY     For ? gangrene after running into barbwire fence at 68yo  . MULTIPLE TOOTH EXTRACTIONS     no dentures     A IV Location/Drains/Wounds Patient Lines/Drains/Airways Status   Active Line/Drains/Airways    Name:   Placement date:   Placement time:   Site:   Days:   Peripheral IV 03/15/19 Right;Upper Arm   03/15/19    1206    Arm   less than 1  Fistula / Graft Left Forearm Arteriovenous vein graft   11/22/16    1337    Forearm   843   Fistula / Graft Left Forearm   -    -    Forearm      Incision (Closed) 11/22/16 Arm Left   11/22/16    1445     843          Intake/Output Last 24 hours  Intake/Output Summary (Last 24 hours) at 03/15/2019 2125 Last data filed at 03/15/2019 1552 Gross per 24 hour  Intake 500 ml  Output -  Net 500 ml    Labs/Imaging Results for orders placed or performed during the hospital encounter of 03/15/19 (from the past 48 hour(s))  CBC     Status: Abnormal   Collection Time: 03/15/19 11:54 AM  Result Value Ref Range   WBC 39.5 (H) 4.0 - 10.5 K/uL   RBC 3.21 (L) 4.22 - 5.81 MIL/uL   Hemoglobin 7.0 (L) 13.0 - 17.0 g/dL    Comment: Reticulocyte Hemoglobin  testing may be clinically indicated, consider ordering this additional test [id number]    HCT 22.6 (L) 39.0 - 52.0 %   MCV 70.4 (L) 80.0 - 100.0 fL   MCH 21.8 (L) 26.0 - 34.0 pg   MCHC 31.0 30.0 - 36.0 g/dL   RDW 26.7 (H) 11.5 - 15.5 %   Platelets 981 (HH) 150 - 400 K/uL    Comment: REPEATED TO VERIFY PLATELET COUNT CONFIRMED BY SMEAR THIS CRITICAL RESULT HAS VERIFIED AND BEEN CALLED TO E.BANKS,RN BY JESSICA WEBBER ON 10 10 2020 AT 1320, AND HAS BEEN READ BACK.     nRBC 0.5 (H) 0.0 - 0.2 %    Comment: Performed at [facility], [place] [address], [place], [place] [address]  Comprehensive metabolic panel     Status: Abnormal   Collection Time: 03/15/19 12:04 PM  Result Value Ref Range   Sodium 133 (L) 135 - 145 mmol/L   Potassium 5.1 3.5 - 5.1 mmol/L   Chloride 101 98 - 111 mmol/L   CO2 21 (L) 22 - 32 mmol/L   Glucose, Bld 160 (H) 70 - 99 mg/dL   BUN 56 (H) 8 - 23 mg/dL   Creatinine, Ser 3.24 (H) 0.61 - 1.24 mg/dL   Calcium 9.3 8.9 - 10.3 mg/dL   Total Protein 7.9 6.5 - 8.1 g/dL   Albumin 2.5 (L) 3.5 - 5.0 g/dL   AST 16 15 - 41 U/L   ALT 10 0 - 44 U/L   Alkaline Phosphatase 85 38 - 126 U/L   Total Bilirubin 0.5 0.3 - 1.2 mg/dL   GFR calc non Af Amer 18 (L) >60 mL/min   GFR calc Af Amer 21 (L) >60 mL/min   Anion gap 11 5 - 15    Comment: Performed at [facility], [place] [address], [place], [place] [address]  Lipase, blood     Status: None   Collection Time: 03/15/19 12:04 PM  Result Value Ref Range   Lipase 51 11 - 51 U/L    Comment: Performed at [facility] [address] [place], [place], [place] [address]  Urinalysis, Routine w reflex microscopic     Status: None   Collection Time: 03/15/19  4:30 PM  Result Value Ref Range   Color, Urine YELLOW YELLOW   APPearance CLEAR CLEAR   Specific Gravity, Urine 1.012 1.005 - 1.030   pH 5.0 5.0 - 8.0  Glucose, UA NEGATIVE NEGATIVE mg/dL   Hgb urine dipstick NEGATIVE NEGATIVE   Bilirubin Urine NEGATIVE NEGATIVE    Ketones, ur NEGATIVE NEGATIVE mg/dL   Protein, ur NEGATIVE NEGATIVE mg/dL   Nitrite NEGATIVE NEGATIVE   Leukocytes,Ua NEGATIVE NEGATIVE    Comment: Performed at [facility] [address], [place], [place] [address]  Urine rapid drug screen (hosp performed)     Status: Abnormal   Collection Time: 03/15/19  4:30 PM  Result Value Ref Range   Opiates NONE DETECTED NONE DETECTED   Cocaine NONE DETECTED NONE DETECTED   Benzodiazepines POSITIVE (A) NONE DETECTED   Amphetamines NONE DETECTED NONE DETECTED   Tetrahydrocannabinol NONE DETECTED NONE DETECTED   Barbiturates NONE DETECTED NONE DETECTED    Comment: (NOTE) DRUG SCREEN FOR MEDICAL PURPOSES ONLY.  IF CONFIRMATION IS NEEDED FOR ANY PURPOSE, NOTIFY LAB WITHIN 5 DAYS. LOWEST DETECTABLE LIMITS FOR URINE DRUG SCREEN Drug Class                     Cutoff (ng/mL) Amphetamine and metabolites    1000 Barbiturate and metabolites    200 Benzodiazepine                 625 Tricyclics and metabolites     300 Opiates and metabolites        300 Cocaine and metabolites        300 THC                            50 Performed at [facility], [place] [address] [place], [place] [address]    Ct Abdomen Pelvis Wo Contrast  Result Date: 03/15/2019 CLINICAL DATA:  Emesis, right lower quadrant pain, history of colon carcinoma EXAM: CT ABDOMEN AND PELVIS WITHOUT CONTRAST TECHNIQUE: Multidetector CT imaging of the abdomen and pelvis was performed following the standard protocol without IV contrast. COMPARISON:  02/09/2018 and previous FINDINGS: Lower chest: Linear scarring or subsegmental atelectasis posteriorly in the lower lobes as before. No pleural or pericardial effusion. Hepatobiliary: No focal liver abnormality is seen. Status post cholecystectomy. No biliary dilatation. Pancreas: Unremarkable. No pancreatic ductal dilatation or surrounding inflammatory changes. Spleen: Small with coarse calcifications as before. Adrenals/Urinary Tract:  Adrenal glands are unremarkable. Kidneys are normal, without renal calculi, focal lesion, or hydronephrosis. Bladder is unremarkable. Stomach/Bowel: Stomach decompressed. Small bowel decompressed. Marked wall thickening involving the terminal ileum and cecum without obstruction. Mild adjacent inflammatory/edematous changes. Regional adenopathy measured up to 3.2 cm , some nodes containing calcifications. The remainder of the colon is unremarkable. Vascular/Lymphatic: Right lower quadrant and central mesenteric adenopathy as above. No retroperitoneal or pelvic adenopathy. No significant vascular pathology identified. Reproductive: Uterus and bilateral adnexa are unremarkable. Other: Bilateral pelvic phleboliths.  No ascites.  No free air. Musculoskeletal: Extensive mottled lytic/sclerotic appearance of all visualized bones, probably related to sickle cell disease, present on studies dating back to 11/04/2016. No fracture. IMPRESSION: 1. Marked progression of wall thickening in the cecum, now also involving terminal ileum, with progressive regional adenopathy, suggesting progression of colon carcinoma. Cannot exclude superimposed typhlitis given the clinical presentation and regional inflammatory/edematous change. 2. Chronic changes of sickle cell disease in visualized bones and spleen. Electronically Signed   By: Lucrezia Europe M.D.   On: 03/15/2019 13:59    Pending Labs Unresulted Labs (From admission, onward)    Start     Ordered   03/16/19 0500  Basic  metabolic panel  Tomorrow morning,   R     03/15/19 1512   03/16/19 0500  CBC  Tomorrow morning,   R     03/15/19 1512   03/15/19 1649  Occult blood card to lab, stool RN will collect  Once,   STAT    Question:  Specimen to be collected by:  Answer:  RN will collect   03/15/19 1705   03/15/19 1628  SARS CORONAVIRUS 2 (TAT 6-24 HRS) Nasopharyngeal Nasopharyngeal Swab  (Asymptomatic/Tier 2 Patients Labs)  Once,   STAT    Question Answer Comment  Is this test  for diagnosis or screening Screening   Symptomatic for COVID-19 as defined by CDC No   Hospitalized for COVID-19 No   Admitted to ICU for COVID-19 No   Previously tested for COVID-19 Yes   Resident in a congregate (group) care setting No   Employed in healthcare setting No      03/15/19 1627   03/15/19 1559  CEA  Once,   STAT     03/15/19 1558   03/15/19 1553  Hemoglobin A1c  Add-on,   AD    Comments: To assess prior glycemic control    03/15/19 1553          Vitals/Pain Today's Vitals   03/15/19 1900 03/15/19 1915 03/15/19 1930 03/15/19 2102  BP: (!) 137/94  139/71 138/68  Pulse: 87  89 88  Resp: (!) 22  12 16   Temp:    99.3 F (37.4 C)  TempSrc:    Oral  SpO2: 95%  96% 98%  PainSc:  Asleep      Isolation Precautions No active isolations  Medications Medications  amLODipine (NORVASC) tablet 5 mg (has no administration in time range)  atorvastatin (LIPITOR) tablet 20 mg (has no administration in time range)  carvedilol (COREG) tablet 25 mg (25 mg Oral Given 03/15/19 2101)  calcitRIOL (ROCALTROL) capsule 0.25 mcg (has no administration in time range)  calcium acetate (PHOSLO) capsule 667 mg (667 mg Oral Not Given 03/15/19 1900)  pantoprazole (PROTONIX) EC tablet 40 mg (has no administration in time range)  ferrous sulfate tablet 325 mg (325 mg Oral Given 16/10/96 0454)  folic acid (FOLVITE) tablet 1 mg (has no administration in time range)  gabapentin (NEURONTIN) capsule 300 mg (300 mg Oral Given 03/15/19 2101)  albuterol (VENTOLIN HFA) 108 (90 Base) MCG/ACT inhaler 2 puff (has no administration in time range)  acetaminophen (TYLENOL) tablet 650 mg (has no administration in time range)    Or  acetaminophen (TYLENOL) suppository 650 mg (has no administration in time range)  docusate sodium (COLACE) capsule 100 mg (100 mg Oral Given 03/15/19 2107)  ondansetron (ZOFRAN) tablet 4 mg (has no administration in time range)    Or  ondansetron (ZOFRAN) injection 4 mg (has  no administration in time range)  zolpidem (AMBIEN) tablet 5 mg (has no administration in time range)  sorbitol 70 % solution 30 mL (has no administration in time range)  docusate sodium (ENEMEEZ) enema 283 mg (has no administration in time range)  camphor-menthol (SARNA) lotion 1 application (has no administration in time range)    And  hydrOXYzine (ATARAX/VISTARIL) tablet 25 mg (has no administration in time range)  calcium carbonate (dosed in mg elemental calcium) suspension 500 mg of elemental calcium (has no administration in time range)  insulin aspart (novoLOG) injection 0-9 Units (has no administration in time range)  piperacillin-tazobactam (ZOSYN) IVPB 2.25 g (has no administration in time range)  fentaNYL (SUBLIMAZE) injection 25 mcg (25 mcg Intravenous Given 03/15/19 1848)  sodium chloride 0.9 % bolus 500 mL (0 mLs Intravenous Stopped 03/15/19 1552)  ondansetron (ZOFRAN) injection 4 mg (4 mg Intravenous Given 03/15/19 1216)  HYDROmorphone (DILAUDID) injection 1 mg (1 mg Intravenous Given 03/15/19 1216)  piperacillin-tazobactam (ZOSYN) IVPB 3.375 g (0 g Intravenous Stopped 03/15/19 1915)    Mobility walks with person assist Moderate fall risk   Focused Assessments gi/gu   R Recommendations: See Admitting Provider Note  Report given to:   Additional Notes:

## 2019-03-15 NOTE — ED Triage Notes (Signed)
Pt wife updated with Pt status.

## 2019-03-15 NOTE — ED Provider Notes (Signed)
[facility] EMERGENCY DEPARTMENT Provider Note   CSN: [id number] Arrival date & time: 03/15/19  1021     History   Chief Complaint Chief Complaint  Patient presents with  . Abdominal Pain  . Emesis  . Nausea    HPI [name] is a 68 y.o. male.     Patient is a 68 year old male with past medical history of CHF, chronic renal insufficiency, hypertension, hyperlipidemia, diabetes, and recent laryngeal biopsy.  He presents today for evaluation of abdominal pain and vomiting.  This started several days ago just following his procedure.  Patient describes discomfort to the right side of the abdomen along with nausea and vomiting.  He denies any diarrhea or constipation.  He denies any bloody stool or vomit.  He believes he has had low-grade fevers at home.  The history is provided by the patient.  Abdominal Pain Pain location:  RUQ and RLQ Pain quality: cramping   Pain radiates to:  Does not radiate Pain severity:  Moderate Onset quality:  Gradual Duration:  3 days Timing:  Constant Progression:  Worsening Chronicity:  New Relieved by:  Nothing Worsened by:  Palpation and movement Ineffective treatments:  None tried Associated symptoms: vomiting   Emesis Associated symptoms: abdominal pain     Past Medical History:  Diagnosis Date  . Arthritis   . Cancer ([place])    colon  . Cataracts, bilateral    surgery to remove  . Diabetes mellitus    type 2  . GERD (gastroesophageal reflux disease)   . Heart murmur   . History of kidney stones    passed stone, no surgery  . Hyperlipidemia   . Hypertension   . Lesion of vocal cord   . Pneumonia 2019   x 2  . Renal insufficiency    no dialysis - stagte 5  . Sickle cell anemia (HCC)    Hgb SS disease   . Wears glasses     Patient Active Problem List   Diagnosis Date Noted  . Congestive heart failure (CHF) ([place]) 01/17/2019  . Acute on chronic diastolic CHF (congestive heart failure) ([place])  01/16/2019  . Hyponatremia 01/16/2019  . Sepsis ([place]) 02/08/2018  . Opiate overdose ([place]) 11/13/2017  . Acute urinary obstruction 03/29/2017  . Symptomatic anemia   . Hb-SS disease with crisis ([place]) 03/27/2017  . Sickle cell pain crisis ([place]) 01/23/2017  . Heart murmur 03/27/2016  . CKD (chronic kidney disease) stage 5, GFR less than 15 ml/min (HCC) 03/26/2016  . Cocaine abuse ([place]) 03/25/2016  . Renal failure (ARF), acute on chronic (HCC)   . Hb-SS disease without crisis ([place])   . Hyperkalemia, diminished renal excretion 09/03/2014  . Leukocytosis 07/28/2011  . LBBB (left bundle branch block) 07/28/2011  . Hypertension   . Type 2 diabetes mellitus with renal complication [facility])     Past Surgical History:  Procedure Laterality Date  . AV FISTULA PLACEMENT Left 11/22/2016   Procedure: INSERTION OF ARTERIOVENOUS (AV) GORE-TEX GRAFT  LEFT FOREARM USING 6MM X 50CM GORETEX GRAFT;  Surgeon: Serafina Mitchell, MD;  Location: [place];  Service: Vascular;  Laterality: Left;  . CATARACT EXTRACTION W/ INTRAOCULAR LENS  IMPLANT, BILATERAL    . CHOLECYSTECTOMY    . CO2 LASER APPLICATION N/A 08/08/91   Procedure: CO2 LASER EXCISION OF VOCAL FOLD LESION;  Surgeon: Melida Quitter, MD;  Location: [place];  Service: ENT;  Laterality: N/A;  . COLON SURGERY     Colon cancer  surgery, removed small amt not full colectomy  . COLONOSCOPY     nclear who follows him for this  . DIRECT LARYNGOSCOPY N/A 03/12/2019   Procedure: SUSPENDED MICRO DIRECT LARYNGOSCOPY;  Surgeon: Melida Quitter, MD;  Location: [place];  Service: ENT;  Laterality: N/A;  . LEG SURGERY     For ? gangrene after running into barbwire fence at 68yo  . MULTIPLE TOOTH EXTRACTIONS     no dentures        Home Medications    Prior to Admission medications   Medication Sig Start Date End Date Taking? Authorizing Provider  acetaminophen (TYLENOL) 500 MG tablet Take 500 mg by mouth every 6 (six) hours as needed for headache (pain).    [provider]  amLODipine (NORVASC) 5 MG tablet Take 5 mg by mouth daily. 10/04/17   [provider]  atorvastatin (LIPITOR) 20 MG tablet Take 20 mg by mouth daily.  02/01/18   [provider]  calcitRIOL (ROCALTROL) 0.25 MCG capsule Take 0.25 mcg by mouth every other day. In the morning. 04/25/18   [provider]  calcium acetate (PHOSLO) 667 MG capsule Take 667 mg by mouth 3 (three) times daily with meals.    [provider]  carvedilol (COREG) 25 MG tablet Take 25 mg by mouth 2 (two) times daily. 09/04/17   [provider]  ferrous sulfate (FEOSOL) 325 (65 FE) MG tablet Take 1 tablet (325 mg total) by mouth 2 (two) times daily with a meal. Patient taking differently: Take 325 mg by mouth 2 (two) times daily.  01/18/19   Monica Becton, MD  folic acid (FOLVITE) 253 MCG tablet Take 800 mcg by mouth daily.    [provider]  furosemide (LASIX) 40 MG tablet Take 40 mg by mouth 2 (two) times daily. Morning & 1400    [provider]  gabapentin (NEURONTIN) 300 MG capsule Take 1 capsule (300 mg total) by mouth at bedtime. Patient taking differently: Take 300 mg by mouth 3 (three) times daily.  01/18/19   Monica Becton, MD  insulin aspart (NOVOLOG FLEXPEN) 100 UNIT/ML FlexPen Inject 2-6 Units into the skin 3 (three) times daily as needed for high blood sugar (CBG >150).    [provider]  omeprazole (PRILOSEC) 20 MG capsule Take 20 mg by mouth daily.     [provider]  ONGLYZA 5 MG TABS tablet Take 5 mg by mouth daily.  06/21/18   [provider]  polyethylene glycol powder (MIRALAX) powder Take 17 g by mouth daily. Patient taking differently: Take 17 g by mouth daily as needed (constipation). Mix in 8 oz liquid and drink 10/28/16   Palumbo, April, MD  VENTOLIN HFA 108 (90 Base) MCG/ACT inhaler Inhale 2 puffs into the lungs every 6 (six) hours as needed for wheezing.  09/03/17   [provider]     Family History Family History  Problem Relation Age of Onset  . Venous thrombosis Mother        Dec. of blood clot   . Diabetes type II Mother   . AAA (abdominal aortic aneurysm) Mother   . Throat cancer Father        Deceased of throat cancer    Social History Social History   Tobacco Use  . Smoking status: Former Smoker    Packs/day: 0.50    Years: 20.00    Pack years: 10.00    Types: Cigarettes    Quit date: 06/05/1966  Years since quitting: 52.8  . Smokeless tobacco: Never Used  Substance Use Topics  . Alcohol use: Yes    Comment: rare beer  . Drug use: Not Currently    Types: Cocaine, "Crack" cocaine    Comment: Last use July 2020     Allergies   Morphine and related   Review of Systems Review of Systems  Gastrointestinal: Positive for abdominal pain and vomiting.  All other systems reviewed and are negative.    Physical Exam Updated Vital Signs BP 116/70 (BP Location: Right Arm)   Pulse 80   Temp 99.5 F (37.5 C) (Oral)   Resp 18   SpO2 100%   Physical Exam Vitals signs and nursing note reviewed.  Constitutional:      General: He is not in acute distress.    Appearance: He is well-developed. He is not diaphoretic.  HENT:     Head: Normocephalic and atraumatic.  Neck:     Musculoskeletal: Normal range of motion and neck supple.  Cardiovascular:     Rate and Rhythm: Normal rate and regular rhythm.     Heart sounds: No murmur. No friction rub.  Pulmonary:     Effort: Pulmonary effort is normal. No respiratory distress.     Breath sounds: Normal breath sounds. No wheezing or rales.  Abdominal:     General: Bowel sounds are normal. There is no distension.     Palpations: Abdomen is soft.     Tenderness: There is abdominal tenderness in the right upper quadrant and right lower quadrant. There is no right CVA tenderness, left CVA tenderness, guarding or rebound.     Comments: There is tenderness to palpation in the right upper and right lower  quadrants.  There is a prior cholecystectomy scar noted in the right upper quadrant.  There are no palpable hernias.  Musculoskeletal: Normal range of motion.  Skin:    General: Skin is warm and dry.  Neurological:     Mental Status: He is alert and oriented to person, place, and time.     Coordination: Coordination normal.      ED Treatments / Results  Labs (all labs ordered are listed, but only abnormal results are displayed) Labs Reviewed  LIPASE, BLOOD  COMPREHENSIVE METABOLIC PANEL  CBC  URINALYSIS, ROUTINE W REFLEX MICROSCOPIC    EKG None  Radiology No results found.  Procedures Procedures (including critical care time)  Medications Ordered in ED Medications  sodium chloride flush (NS) 0.9 % injection 3 mL (has no administration in time range)  sodium chloride 0.9 % bolus 500 mL (has no administration in time range)  ondansetron (ZOFRAN) injection 4 mg (has no administration in time range)  HYDROmorphone (DILAUDID) injection 1 mg (has no administration in time range)     Initial Impression / Assessment and Plan / ED Course  I have reviewed the triage vital signs and the nursing notes.  Pertinent labs & imaging results that were available during my care of the patient were reviewed by me and considered in my medical decision making (see chart for details).  Patient is a 68 year old male with history of end-stage renal disease on hemodialysis presenting with right-sided abdominal pain.  Initial white count is 39,000 and CT scan shows wall thickening in the cecum and terminal ileum with progressive regional adenopathy suggesting progression of colon carcinoma.  There is also the suspicion of typhlitis given the clinical presentation and regional inflammatory/edematous change.   These findings were discussed with Dr. Georgette Dover  from general surgery.  He feels as though antibiotics and admission to the hospitalist service is appropriate.  I spoken with Dr. Lorin Mercy who agrees  to admit.  Patient given Zosyn.  Final Clinical Impressions(s) / ED Diagnoses   Final diagnoses:  None    ED Discharge Orders    None       Veryl Speak, MD 03/15/19 1555

## 2019-03-15 NOTE — Progress Notes (Signed)
Pharmacy Antibiotic Note  [name] is a 68 y.o. male admitted on 03/15/2019 with N/V.  Patient recently underwent laryngeal biopsy.  Pharmacy has been consulted for Zosyn dosing for IAI.  Patient has CKD5 - SCr 3.24, CrCL 19 ml/min, afebrile, WBC 39.5.  Plan: Zosyn 2.25gm IV Q8H Monitor renal fxn, clinical progress     Temp (24hrs), Avg:99.5 F (37.5 C), Min:99.5 F (37.5 C), Max:99.5 F (37.5 C)  Recent Labs  Lab 03/12/19 0758 03/12/19 0759 03/15/19 1154 03/15/19 1204  WBC 34.2*  --  39.5*  --   CREATININE  --  4.26*  --  3.24*    Estimated Creatinine Clearance: 19.2 mL/min (A) (by C-G formula based on SCr of 3.24 mg/dL (H)).    Allergies  Allergen Reactions  . Morphine And Related Itching    [name] D. Mina Marble, PharmD, BCPS, [place] 03/15/2019, 3:26 PM

## 2019-03-15 NOTE — ED Notes (Signed)
Called to give report. Nurse unavailable. Will call back shortly.

## 2019-03-16 DIAGNOSIS — C189 Malignant neoplasm of colon, unspecified: Secondary | ICD-10-CM | POA: Diagnosis not present

## 2019-03-16 DIAGNOSIS — E1122 Type 2 diabetes mellitus with diabetic chronic kidney disease: Secondary | ICD-10-CM | POA: Diagnosis not present

## 2019-03-16 DIAGNOSIS — N185 Chronic kidney disease, stage 5: Secondary | ICD-10-CM | POA: Diagnosis not present

## 2019-03-16 DIAGNOSIS — C182 Malignant neoplasm of ascending colon: Secondary | ICD-10-CM | POA: Diagnosis not present

## 2019-03-16 LAB — BASIC METABOLIC PANEL
Anion gap: 12 (ref 5–15)
BUN: 55 mg/dL — ABNORMAL HIGH (ref 8–23)
CO2: 21 mmol/L — ABNORMAL LOW (ref 22–32)
Calcium: 9.2 mg/dL (ref 8.9–10.3)
Chloride: 103 mmol/L (ref 98–111)
Creatinine, Ser: 3.38 mg/dL — ABNORMAL HIGH (ref 0.61–1.24)
GFR calc Af Amer: 20 mL/min — ABNORMAL LOW (ref >60)
GFR calc non Af Amer: 17 mL/min — ABNORMAL LOW (ref >60)
Glucose, Bld: 189 mg/dL — ABNORMAL HIGH (ref 70–99)
Potassium: 5 mmol/L (ref 3.5–5.1)
Sodium: 136 mmol/L (ref 135–145)

## 2019-03-16 LAB — PREPARE RBC (CROSSMATCH)

## 2019-03-16 LAB — GLUCOSE, CAPILLARY
Glucose-Capillary: 174 mg/dL — ABNORMAL HIGH (ref 70–99)
Glucose-Capillary: 108 mg/dL — ABNORMAL HIGH (ref 70–99)
Glucose-Capillary: 226 mg/dL — ABNORMAL HIGH (ref 70–99)

## 2019-03-16 LAB — CBC
HCT: 19.8 % — ABNORMAL LOW (ref 39.0–52.0)
Hemoglobin: 6.5 g/dL — CL (ref 13.0–17.0)
MCH: 22.3 pg — ABNORMAL LOW (ref 26.0–34.0)
MCHC: 32.8 g/dL (ref 30.0–36.0)
MCV: 67.8 fL — ABNORMAL LOW (ref 80.0–100.0)
Platelets: 746 10*3/uL — ABNORMAL HIGH (ref 150–400)
RBC: 2.92 MIL/uL — ABNORMAL LOW (ref 4.22–5.81)
RDW: 26.1 % — ABNORMAL HIGH (ref 11.5–15.5)
WBC: 43.2 10*3/uL — ABNORMAL HIGH (ref 4.0–10.5)
nRBC: 0.3 % — ABNORMAL HIGH (ref 0.0–0.2)

## 2019-03-16 MED ORDER — PEG-KCL-NACL-NASULF-NA ASC-C 100 G PO SOLR
0.5000 | Freq: Once | ORAL | Status: AC
  Administered 2019-03-16: 100 g via ORAL
  Filled 2019-03-16: qty 1

## 2019-03-16 MED ORDER — PEG-KCL-NACL-NASULF-NA ASC-C 100 G PO SOLR: 0.5000 | Freq: Once | ORAL | Status: DC

## 2019-03-16 MED ORDER — METOCLOPRAMIDE HCL 5 MG/ML IJ SOLN
5.0000 mg | Freq: Once | INTRAMUSCULAR | Status: AC
  Administered 2019-03-17: 5 mg via INTRAVENOUS
  Filled 2019-03-16: qty 2

## 2019-03-16 MED ORDER — HYDROMORPHONE HCL 2 MG PO TABS
1.0000 mg | ORAL_TABLET | ORAL | Status: DC | PRN
  Administered 2019-03-16 – 2019-03-20 (×9): 1 mg via ORAL
  Filled 2019-03-16 (×9): qty 1

## 2019-03-16 MED ORDER — PEG-KCL-NACL-NASULF-NA ASC-C 100 G PO SOLR
0.5000 | Freq: Once | ORAL | Status: AC
  Administered 2019-03-17: 100 g via ORAL

## 2019-03-16 MED ORDER — HYDROMORPHONE HCL 1 MG/ML IJ SOLN
1.0000 mg | Freq: Once | INTRAMUSCULAR | Status: AC
  Administered 2019-03-16: 1 mg via INTRAVENOUS

## 2019-03-16 MED ORDER — HYDROMORPHONE HCL 1 MG/ML IJ SOLN
INTRAMUSCULAR | Status: AC
  Filled 2019-03-16: qty 1

## 2019-03-16 MED ORDER — PEG-KCL-NACL-NASULF-NA ASC-C 100 G PO SOLR: 1.0000 | Freq: Once | ORAL | Status: DC

## 2019-03-16 MED ORDER — METOCLOPRAMIDE HCL 5 MG/ML IJ SOLN: 10.0000 mg | Freq: Once | INTRAMUSCULAR | Status: DC

## 2019-03-16 MED ORDER — SODIUM CHLORIDE 0.9% IV SOLUTION: Freq: Once | INTRAVENOUS | Status: DC

## 2019-03-16 NOTE — Progress Notes (Signed)
Patient ID: [name], male   DOB: 17-Dec-1950, 68 y.o.   MRN: [id number] [facility] Progress Note:   * No surgery found *  Subjective: Mental status is fairly clear-complaining of right sided pain Objective: Vital signs in last 24 hours: Temp:  [98.2 F (36.8 C)-99.5 F (37.5 C)] 98.3 F (36.8 C) (10/11 0443) Pulse Rate:  [66-89] 81 (10/11 0443) Resp:  [0-25] 17 (10/11 0443) BP: (116-161)/(62-94) 131/78 (10/11 0443) SpO2:  [95 %-100 %] 97 % (10/11 0443)  Intake/Output from previous day: 10/10 0701 - 10/11 0700 In: 500 [IV Piggyback:500] Out: 500 [Urine:500] Intake/Output this shift: Total I/O In: -  Out: 150 [Urine:150]  Physical Exam: Work of breathing is not labored;  Pain on the right side  Lab Results:  Results for orders placed or performed during the hospital encounter of 03/15/19 (from the past 48 hour(s))  CBC     Status: Abnormal   Collection Time: 03/15/19 11:54 AM  Result Value Ref Range   WBC 39.5 (H) 4.0 - 10.5 K/uL   RBC 3.21 (L) 4.22 - 5.81 MIL/uL   Hemoglobin 7.0 (L) 13.0 - 17.0 g/dL    Comment: Reticulocyte Hemoglobin testing may be clinically indicated, consider ordering this additional test [id number]    HCT 22.6 (L) 39.0 - 52.0 %   MCV 70.4 (L) 80.0 - 100.0 fL   MCH 21.8 (L) 26.0 - 34.0 pg   MCHC 31.0 30.0 - 36.0 g/dL   RDW 26.7 (H) 11.5 - 15.5 %   Platelets 981 (HH) 150 - 400 K/uL    Comment: REPEATED TO VERIFY PLATELET COUNT CONFIRMED BY SMEAR THIS CRITICAL RESULT HAS VERIFIED AND BEEN CALLED TO E.BANKS,RN BY JESSICA WEBBER ON 10 10 2020 AT 1320, AND HAS BEEN READ BACK.     nRBC 0.5 (H) 0.0 - 0.2 %    Comment: Performed at [facility], [place] [address] [place], [place] [address]  Comprehensive metabolic panel     Status: Abnormal   Collection Time: 03/15/19 12:04 PM  Result Value Ref Range   Sodium 133 (L) 135 - 145 mmol/L   Potassium 5.1 3.5 - 5.1 mmol/L   Chloride 101 98 - 111 mmol/L   CO2 21 (L) 22 - 32 mmol/L   Glucose, Bld 160 (H) 70 - 99 mg/dL   BUN 56 (H) 8 - 23 mg/dL   Creatinine, Ser 3.24 (H) 0.61 - 1.24 mg/dL   Calcium 9.3 8.9 - 10.3 mg/dL   Total Protein 7.9 6.5 - 8.1 g/dL   Albumin 2.5 (L) 3.5 - 5.0 g/dL   AST 16 15 - 41 U/L   ALT 10 0 - 44 U/L   Alkaline Phosphatase 85 38 - 126 U/L   Total Bilirubin 0.5 0.3 - 1.2 mg/dL   GFR calc non Af Amer 18 (L) >60 mL/min   GFR calc Af Amer 21 (L) >60 mL/min   Anion gap 11 5 - 15    Comment: Performed at [facility], [place] [address] [place], [place] [address]  Lipase, blood     Status: None   Collection Time: 03/15/19 12:04 PM  Result Value Ref Range   Lipase 51 11 - 51 U/L    Comment: Performed at [facility] [address], [place], [place] [address]  SARS CORONAVIRUS 2 (TAT 6-24 HRS) Nasopharyngeal Nasopharyngeal Swab     Status: None   Collection Time: 03/15/19  4:28 PM   Specimen: Nasopharyngeal Swab  Result Value Ref Range   SARS Coronavirus 2 NEGATIVE NEGATIVE    Comment: (NOTE) SARS-CoV-2 target nucleic acids are NOT DETECTED. The SARS-CoV-2 RNA is generally detectable in upper and lower respiratory specimens during the acute phase of infection. Negative results do not preclude SARS-CoV-2 infection, do not rule out co-infections with other pathogens, and should not be used as the sole basis for treatment or other patient management decisions. Negative results must be combined with clinical observations, patient history, and epidemiological information. The expected result is Negative. Fact Sheet for Patients: SugarRoll.be Fact Sheet for Healthcare Providers: https://www.woods-mathews.com/ This test is not yet approved or cleared by the [place] FDA and  has been authorized for detection and/or diagnosis of SARS-CoV-2 by FDA under an Emergency Use Authorization (EUA). This EUA will remain  in effect (meaning this test can be used) for the duration of the COVID-19  declaration under Section 56 4(b)(1) of the Act, 21 U.S.C. section 360bbb-3(b)(1), unless the authorization is terminated or revoked sooner. Performed at [facility], [place] [address] [place], [place] [address]   Urinalysis, Routine w reflex microscopic     Status: None   Collection Time: 03/15/19  4:30 PM  Result Value Ref Range   Color, Urine YELLOW YELLOW   APPearance CLEAR CLEAR   Specific Gravity, Urine 1.012 1.005 - 1.030   pH 5.0 5.0 - 8.0   Glucose, UA NEGATIVE NEGATIVE mg/dL   Hgb urine dipstick NEGATIVE NEGATIVE   Bilirubin Urine NEGATIVE NEGATIVE   Ketones, ur NEGATIVE NEGATIVE mg/dL   Protein, ur NEGATIVE NEGATIVE mg/dL   Nitrite NEGATIVE NEGATIVE   Leukocytes,Ua NEGATIVE NEGATIVE    Comment: Performed at [facility] [address], [place], [place] [address]  Urine rapid drug screen (hosp performed)     Status: Abnormal   Collection Time: 03/15/19  4:30 PM  Result Value Ref Range   Opiates NONE DETECTED NONE DETECTED   Cocaine NONE DETECTED NONE DETECTED   Benzodiazepines POSITIVE (A) NONE DETECTED   Amphetamines NONE DETECTED NONE DETECTED   Tetrahydrocannabinol NONE DETECTED NONE DETECTED   Barbiturates NONE DETECTED NONE DETECTED    Comment: (NOTE) DRUG SCREEN FOR MEDICAL PURPOSES ONLY.  IF CONFIRMATION IS NEEDED FOR ANY PURPOSE, NOTIFY LAB WITHIN 5 DAYS. LOWEST DETECTABLE LIMITS FOR URINE DRUG SCREEN Drug Class                     Cutoff (ng/mL) Amphetamine and metabolites    1000 Barbiturate and metabolites    200 Benzodiazepine                 017 Tricyclics and metabolites     300 Opiates and metabolites        300 Cocaine and metabolites        300 THC                            50 Performed at [facility], [place] [address] [place], [place] [address]   Glucose, capillary     Status: Abnormal   Collection Time: 03/15/19 10:20 PM  Result Value Ref Range   Glucose-Capillary 193 (H) 70 - 99 mg/dL  Basic metabolic panel      Status: Abnormal   Collection Time: 03/16/19  4:59 AM  Result Value Ref Range   Sodium 136 135 - 145 mmol/L   Potassium 5.0 3.5 - 5.1 mmol/L  Chloride 103 98 - 111 mmol/L   CO2 21 (L) 22 - 32 mmol/L   Glucose, Bld 189 (H) 70 - 99 mg/dL   BUN 55 (H) 8 - 23 mg/dL   Creatinine, Ser 3.38 (H) 0.61 - 1.24 mg/dL   Calcium 9.2 8.9 - 10.3 mg/dL   GFR calc non Af Amer 17 (L) >60 mL/min   GFR calc Af Amer 20 (L) >60 mL/min   Anion gap 12 5 - 15    Comment: Performed at [facility] [address], [place], [place] [address]  CBC     Status: Abnormal   Collection Time: 03/16/19  4:59 AM  Result Value Ref Range   WBC 43.2 (H) 4.0 - 10.5 K/uL   RBC 2.92 (L) 4.22 - 5.81 MIL/uL   Hemoglobin 6.5 (LL) 13.0 - 17.0 g/dL    Comment: REPEATED TO VERIFY Reticulocyte Hemoglobin testing may be clinically indicated, consider ordering this additional test [id number] THIS CRITICAL RESULT HAS VERIFIED AND BEEN CALLED TO N.IRISH,RN BY MELISSA BROGDON ON 10 11 2020 AT 0541, AND HAS BEEN READ BACK.     HCT 19.8 (L) 39.0 - 52.0 %   MCV 67.8 (L) 80.0 - 100.0 fL   MCH 22.3 (L) 26.0 - 34.0 pg   MCHC 32.8 30.0 - 36.0 g/dL   RDW 26.1 (H) 11.5 - 15.5 %   Platelets 746 (H) 150 - 400 K/uL    Comment: REPEATED TO VERIFY PLATELET COUNT CONFIRMED BY SMEAR SPECIMEN CHECKED FOR CLOTS    nRBC 0.3 (H) 0.0 - 0.2 %    Comment: Performed at [facility] [address] [place], [place] [address]  Type and screen [facility]     Status: None (Preliminary result)   Collection Time: 03/16/19  6:03 AM  Result Value Ref Range   ABO/RH(D) AB POS    Antibody Screen NEG    Sample Expiration 03/19/2019,2359    Unit Number [id number]    Blood Component Type RED CELLS,LR    Unit division 00    Status of Unit ALLOCATED    Transfusion Status OK TO TRANSFUSE    Crossmatch Result      Compatible Performed at [facility], [place] [address] [place], [place] [address]   Prepare RBC      Status: None   Collection Time: 03/16/19  6:05 AM  Result Value Ref Range   Order Confirmation      ORDER PROCESSED BY BLOOD BANK Performed at [facility], [place] [address], [place], [place] [address]   Glucose, capillary     Status: Abnormal   Collection Time: 03/16/19  7:26 AM  Result Value Ref Range   Glucose-Capillary 174 (H) 70 - 99 mg/dL    Radiology/Results: Ct Abdomen Pelvis Wo Contrast  Result Date: 03/15/2019 CLINICAL DATA:  Emesis, right lower quadrant pain, history of colon carcinoma EXAM: CT ABDOMEN AND PELVIS WITHOUT CONTRAST TECHNIQUE: Multidetector CT imaging of the abdomen and pelvis was performed following the standard protocol without IV contrast. COMPARISON:  02/09/2018 and previous FINDINGS: Lower chest: Linear scarring or subsegmental atelectasis posteriorly in the lower lobes as before. No pleural or pericardial effusion. Hepatobiliary: No focal liver abnormality is seen. Status post cholecystectomy. No biliary dilatation. Pancreas: Unremarkable. No pancreatic ductal dilatation or surrounding inflammatory changes. Spleen: Small with coarse calcifications as before. Adrenals/Urinary Tract: Adrenal glands are unremarkable. Kidneys are normal, without renal calculi, focal lesion, or hydronephrosis. Bladder is unremarkable.  Stomach/Bowel: Stomach decompressed. Small bowel decompressed. Marked wall thickening involving the terminal ileum and cecum without obstruction. Mild adjacent inflammatory/edematous changes. Regional adenopathy measured up to 3.2 cm , some nodes containing calcifications. The remainder of the colon is unremarkable. Vascular/Lymphatic: Right lower quadrant and central mesenteric adenopathy as above. No retroperitoneal or pelvic adenopathy. No significant vascular pathology identified. Reproductive: Uterus and bilateral adnexa are unremarkable. Other: Bilateral pelvic phleboliths.  No ascites.  No free air. Musculoskeletal: Extensive mottled lytic/sclerotic  appearance of all visualized bones, probably related to sickle cell disease, present on studies dating back to 11/04/2016. No fracture. IMPRESSION: 1. Marked progression of wall thickening in the cecum, now also involving terminal ileum, with progressive regional adenopathy, suggesting progression of colon carcinoma. Cannot exclude superimposed typhlitis given the clinical presentation and regional inflammatory/edematous change. 2. Chronic changes of sickle cell disease in visualized bones and spleen. Electronically Signed   By: Lucrezia Europe M.D.   On: 03/15/2019 13:59    Anti-infectives: Anti-infectives (From admission, onward)   Start     Dose/Rate Route Frequency Ordered Stop   03/16/19 0000  piperacillin-tazobactam (ZOSYN) IVPB 2.25 g     2.25 g 100 mL/hr over 30 Minutes Intravenous Every 8 hours 03/15/19 1527     03/15/19 1430  piperacillin-tazobactam (ZOSYN) IVPB 3.375 g     3.375 g 12.5 mL/hr over 240 Minutes Intravenous  Once 03/15/19 1423 03/15/19 1915      Assessment/Plan: Problem List: Patient Active Problem List   Diagnosis Date Noted  . Colon cancer ([facility]) 03/15/2019  . Anemia 03/15/2019  . Congestive heart failure (CHF) ([facility]) 01/17/2019  . Acute on chronic diastolic CHF (congestive heart failure) ([facility]) 01/16/2019  . Hyponatremia 01/16/2019  . Sepsis ([facility]) 02/08/2018  . Opiate overdose ([facility]) 11/13/2017  . Acute urinary obstruction 03/29/2017  . Symptomatic anemia   . Hb-SS disease with crisis ([facility]) 03/27/2017  . Sickle cell pain crisis ([facility]) 01/23/2017  . Heart murmur 03/27/2016  . CKD (chronic kidney disease) stage 5, GFR less than 15 ml/min (HCC) 03/26/2016  . Cocaine abuse ([facility]) 03/25/2016  . Renal failure (ARF), acute on chronic (HCC)   . Hb-SS disease without crisis ([place])   . Hyperkalemia, diminished renal excretion 09/03/2014  . Leukocytosis 07/28/2011  . LBBB (left bundle branch block) 07/28/2011  . Hypertension   . Type 2 diabetes mellitus with renal  complication (HCC)     Unfortunate man with extensive involvement of right colon with mass presumed cancer-will need colonoscopy.  He has a plethora of medical problems including HgSS and impending renal failure.  CCS following * No surgery found *    LOS: 1 day   Matt B. Hassell Done, MD, [facility] [phone] beeper [phone]  03/16/2019 9:52 AM

## 2019-03-16 NOTE — H&P (View-Only) (Signed)
° °Referring Provider: No ref. provider found °Primary Care Physician:  Avbuere, Edwin, MD °Primary Gastroenterologist:  ? Reports he had colonoscopy here in [place] 5-6 years ago but could not recall who performed it ° °Reason for Consultation:  Abnormal CT scan, ? Colon cancer ° °HPI: [name] is a 68 y.o. male with medical history significant of sickle cell anemia (SS disease); ESRD not yet on HD; HTN; HLD; DM; and colon CA 20-25 years ago.  He had a vocal cord lesion removed with CO2 laser excision during microlaryngoscopy on 10/7.  He went home and then started vomiting the evening of 10/7.  No vomiting prior to surgery.  Also started having RLQ abdominal pain.  He has been losing a lot of weight of about 50 pounds in the last 4-5 months.  He started having night sweats recently.  Stools have been black because he has been taking iron, but were not black prior to starting that and denies seeing red blood in his stools.  Also says that the iron has made him a little constipated but otherwise no major changes in his bowel movements. °  °He was last admitted in August, 8/13-8/15 for symptomatic anemia thought to be from CKD and given IV iron and Epogen.  Hgb of 3.8 grams that time so also received 4 units of PRBC's but was heme negative at that time. ° °On this admission CT scan of the abdomen and pelvis without contrast shows the following: ° °IMPRESSION: °1. Marked progression of wall thickening in the cecum, now also °involving terminal ileum, with progressive regional adenopathy, °suggesting progression of colon carcinoma. Cannot exclude °superimposed typhlitis given the clinical presentation and regional °inflammatory/edematous change. °2. Chronic changes of sickle cell disease in visualized bones and °Spleen. ° °Has been started on Zosyn.  Hgb was 7.0 grams on admission so was not transfused, but is down to 6.5 grams this AM so receiving transfusion as soon as they have good IV access.  WBC count  43.2K this AM.  This was elevated back at August admission at 24K and also elevated last year up to levels of at least 17K. ° °Interestingly, last year he had a CT scan that showed the following: ° °IMPRESSION: °Interval development of pathologically enlarged lymph nodes in the °right retroperitoneum along the right iliac chain and posterior to °the cecum. °  °Questionable mucosal thickening versus adherent stool to the walls °of the cecum. °  °Marked dilation of the urinary bladder. Please correlate clinically. °  °Marked heterogeneous mottled sclerotic appearance of the osseous °structures. This may be due to sickle cell disease or represents °diffuse metastatic disease. ° ° °Past Medical History:  °Diagnosis Date  °• Arthritis   °• Cancer (HCC)   ° colon  °• Cataracts, bilateral   ° surgery to remove  °• Diabetes mellitus   ° type 2  °• GERD (gastroesophageal reflux disease)   °• Heart murmur   °• History of kidney stones   ° passed stone, no surgery  °• Hyperlipidemia   °• Hypertension   °• Lesion of vocal cord   °• Pneumonia 2019  ° x 2  °• Renal insufficiency   ° no dialysis - stagte 5  °• Sickle cell anemia (HCC)   ° Hgb SS disease   °• Wears glasses   ° ° °Past Surgical History:  °Procedure Laterality Date  °• AV FISTULA PLACEMENT Left 11/22/2016  ° Procedure: INSERTION OF ARTERIOVENOUS (AV) GORE-TEX GRAFT  LEFT FOREARM USING 6MM X 50CM GORETEX   GRAFT;  Surgeon: Serafina Mitchell, MD;  Location: [facility] OR;  Service: Vascular;  Laterality: Left;   CATARACT EXTRACTION W/ INTRAOCULAR LENS  IMPLANT, BILATERAL     CHOLECYSTECTOMY     CO2 LASER APPLICATION N/A 51/12/15   Procedure: CO2 LASER EXCISION OF VOCAL FOLD LESION;  Surgeon: Melida Quitter, MD;  Location: [place];  Service: ENT;  Laterality: N/A;   COLON SURGERY     Colon cancer surgery, removed small amt not full colectomy   COLONOSCOPY     nclear who follows him for this   DIRECT LARYNGOSCOPY N/A 03/12/2019   Procedure: SUSPENDED MICRO DIRECT  LARYNGOSCOPY;  Surgeon: Melida Quitter, MD;  Location: [employer];  Service: ENT;  Laterality: N/A;   LEG SURGERY     For ? gangrene after running into barbwire fence at 68yo   MULTIPLE TOOTH EXTRACTIONS     no dentures    Prior to Admission medications   Medication Sig Start Date End Date Taking? Authorizing Provider  acetaminophen (TYLENOL) 500 MG tablet Take 500 mg by mouth every 6 (six) hours as needed for headache (pain).   Yes [provider]  amLODipine (NORVASC) 5 MG tablet Take 5 mg by mouth daily. 10/04/17  Yes [provider]  atorvastatin (LIPITOR) 20 MG tablet Take 20 mg by mouth daily.  02/01/18  Yes [provider]  calcitRIOL (ROCALTROL) 0.25 MCG capsule Take 0.25 mcg by mouth every other day. In the morning. 04/25/18  Yes [provider]  calcium acetate (PHOSLO) 667 MG capsule Take 667 mg by mouth 3 (three) times daily with meals.   Yes [provider]  carvedilol (COREG) 25 MG tablet Take 25 mg by mouth 2 (two) times daily. 09/04/17  Yes [provider]  ferrous sulfate (FEOSOL) 325 (65 FE) MG tablet Take 1 tablet (325 mg total) by mouth 2 (two) times daily with a meal. Patient taking differently: Take 325 mg by mouth 2 (two) times daily.  01/18/19  Yes Vasireddy, Grier Mitts, MD  folic acid (FOLVITE) 494 MCG tablet Take 800 mcg by mouth daily.   Yes [provider]  furosemide (LASIX) 40 MG tablet Take 40 mg by mouth See admin instructions. Morning & 1400    Yes [provider]  gabapentin (NEURONTIN) 300 MG capsule Take 1 capsule (300 mg total) by mouth at bedtime. Patient taking differently: Take 300 mg by mouth 3 (three) times daily.  01/18/19  Yes Vasireddy, Grier Mitts, MD  insulin aspart (NOVOLOG FLEXPEN) 100 UNIT/ML FlexPen Inject 2-6 Units into the skin 3 (three) times daily as needed for high blood sugar (CBG >150).   Yes [provider]  omeprazole (PRILOSEC) 20 MG capsule Take 20 mg by mouth daily.     Yes [provider]  ONGLYZA 5 MG TABS tablet Take 5 mg by mouth daily.  06/21/18  Yes [provider]  polyethylene glycol powder (MIRALAX) powder Take 17 g by mouth daily. Patient taking differently: Take 17 g by mouth daily as needed (constipation). Mix in 8 oz liquid and drink 10/28/16  Yes Palumbo, April, MD  VENTOLIN HFA 108 (90 Base) MCG/ACT inhaler Inhale 2 puffs into the lungs every 6 (six) hours as needed for wheezing.  09/03/17  Yes [provider]    Current Facility-Administered Medications  Medication Dose Route Frequency Provider Last Rate Last Dose   0.9 %  sodium chloride infusion (Manually program via Guardrails IV Fluids)   Intravenous Once Elder Cyphers, NP  acetaminophen (TYLENOL) tablet 650 mg  650 mg Oral Q6H PRN Karmen Bongo, MD       Or   acetaminophen (TYLENOL) suppository 650 mg  650 mg Rectal Q6H PRN Karmen Bongo, MD       albuterol (PROVENTIL) (2.5 MG/3ML) 0.083% nebulizer solution 2.5 mg  2.5 mg Inhalation Q6H PRN Karmen Bongo, MD       amLODipine (NORVASC) tablet 5 mg  5 mg Oral Daily Karmen Bongo, MD       atorvastatin (LIPITOR) tablet 20 mg  20 mg Oral Daily Karmen Bongo, MD       calcitRIOL (ROCALTROL) capsule 0.25 mcg  0.25 mcg Oral Cathlean Sauer, MD       calcium acetate (PHOSLO) capsule 667 mg  667 mg Oral TID WC Karmen Bongo, MD   667 mg at 03/16/19 0845   calcium carbonate (dosed in mg elemental calcium) suspension 500 mg of elemental calcium  500 mg of elemental calcium Oral Q6H PRN Karmen Bongo, MD       camphor-menthol [facility]) lotion 1 application  1 application Topical E4M PRN Karmen Bongo, MD       And   hydrOXYzine (ATARAX/VISTARIL) tablet 25 mg  25 mg Oral Q8H PRN Karmen Bongo, MD       carvedilol (COREG) tablet 25 mg  25 mg Oral BID WC Karmen Bongo, MD   25 mg at 03/16/19 0845   docusate sodium (COLACE) capsule 100 mg  100 mg Oral BID Karmen Bongo, MD   100 mg at  03/15/19 2107   docusate sodium (ENEMEEZ) enema 283 mg  1 enema Rectal PRN Karmen Bongo, MD       fentaNYL (SUBLIMAZE) injection 25 mcg  25 mcg Intravenous Q2H PRN Karmen Bongo, MD   25 mcg at 03/16/19 3536   ferrous sulfate tablet 325 mg  325 mg Oral BID Karmen Bongo, MD   325 mg at 14/43/15 4008   folic acid (FOLVITE) tablet 1 mg  1 mg Oral Daily Karmen Bongo, MD       gabapentin (NEURONTIN) capsule 300 mg  300 mg Oral TID Karmen Bongo, MD   300 mg at 03/15/19 2101   insulin aspart (novoLOG) injection 0-9 Units  0-9 Units Subcutaneous TID WC Karmen Bongo, MD   2 Units at 03/16/19 0846   ondansetron (ZOFRAN) tablet 4 mg  4 mg Oral Q6H PRN Karmen Bongo, MD       Or   ondansetron [facility]) injection 4 mg  4 mg Intravenous Q6H PRN Karmen Bongo, MD   4 mg at 03/15/19 2143   pantoprazole (PROTONIX) EC tablet 40 mg  40 mg Oral Daily Karmen Bongo, MD       piperacillin-tazobactam (ZOSYN) IVPB 2.25 g  2.25 g Intravenous Q8H Dang, Thuy D, RPH 100 mL/hr at 03/16/19 0855 2.25 g at 03/16/19 0855   sorbitol 70 % solution 30 mL  30 mL Oral PRN Karmen Bongo, MD       zolpidem Lorrin Mais) tablet 5 mg  5 mg Oral QHS PRN Karmen Bongo, MD        Allergies as of 03/15/2019 - Review Complete 03/15/2019  Allergen Reaction Noted   Morphine and related Itching 07/27/2011    Family History  Problem Relation Age of Onset   Venous thrombosis Mother        Dec. of blood clot    Diabetes type II Mother    AAA (abdominal aortic aneurysm) Mother    Throat cancer Father  Deceased of throat cancer  ° ° °Social History  ° °Socioeconomic History  °• Marital status: Divorced  °  Spouse name: Not on file  °• Number of children: Not on file  °• Years of education: Not on file  °• Highest education level: Not on file  °Occupational History  °• Occupation: retired  °Social Needs  °• Financial resource strain: Not on file  °• Food insecurity  °  Worry: Not on file  °   Inability: Not on file  °• Transportation needs  °  Medical: Not on file  °  Non-medical: Not on file  °Tobacco Use  °• Smoking status: Former Smoker  °  Packs/day: 0.50  °  Years: 20.00  °  Pack years: 10.00  °  Types: Cigarettes  °  Quit date: 06/05/1966  °  Years since quitting: 52.8  °• Smokeless tobacco: Never Used  °Substance and Sexual Activity  °• Alcohol use: Yes  °  Comment: rare beer, last use maybe July  °• Drug use: Not Currently  °  Types: Cocaine, "Crack" cocaine  °  Comment: Last use July 2020  °• Sexual activity: Not on file  °Lifestyle  °• Physical activity  °  Days per week: Not on file  °  Minutes per session: Not on file  °• Stress: Not on file  °Relationships  °• Social connections  °  Talks on phone: Not on file  °  Gets together: Not on file  °  Attends religious service: Not on file  °  Active member of club or organization: Not on file  °  Attends meetings of clubs or organizations: Not on file  °  Relationship status: Not on file  °• Intimate partner violence  °  Fear of current or ex partner: Not on file  °  Emotionally abused: Not on file  °  Physically abused: Not on file  °  Forced sexual activity: Not on file  °Other Topics Concern  °• Not on file  °Social History Narrative  ° Retired truck driver, on disability. Former smoker. Living by himself, able to get around OK.   ° Has a wife who checks on him but they don't live together sicne 2007  ° Quit drinking   ° ° °Review of Systems: °ROS is O/W negative except as mentioned in HPI. ° °Physical Exam: °Vital signs in last 24 hours: °Temp:  [98.2 °F (36.8 °C)-99.5 °F (37.5 °C)] 98.3 °F (36.8 °C) (10/11 0443) °Pulse Rate:  [66-89] 81 (10/11 0443) °Resp:  [0-25] 17 (10/11 0443) °BP: (116-161)/(62-94) 131/78 (10/11 0443) °SpO2:  [95 %-100 %] 97 % (10/11 0443) °  °General:  Alert, Well-developed, well-nourished, pleasant and cooperative in NAD °Head:  Normocephalic and atraumatic. °Eyes:  Sclera clear, no icterus.  Conjunctiva pink. °Ears:   Normal auditory acuity. °Mouth:  No deformity or lesions.   °Lungs:  Clear throughout to auscultation.  No wheezes, crackles, or rhonchi.  °Heart:  Regular rate and rhythm; no murmurs, clicks, rubs, or gallops. °Abdomen:  Soft, non-distended.  BS present.  RLQ TTP. °Rectal:  Will be done at the time of colonoscopy. °Msk:  Symmetrical without gross deformities. °Pulses:  Normal pulses noted. °Extremities:  Without clubbing or edema. °Neurologic:  Alert and  oriented x4;  grossly normal neurologically. °Skin:  Intact without significant lesions or rashes. °Psych:  Alert and cooperative. Normal mood and affect. ° °Intake/Output from previous day: °10/10 0701 - 10/11 0700 °In: 500 [IV Piggyback:500] °  Out: 500 [Urine:500] Intake/Output this shift: Total I/O In: -  Out: 150 [Urine:150]  Lab Results: Recent Labs    03/15/19 1154 03/16/19 0459  WBC 39.5* 43.2*  HGB 7.0* 6.5*  HCT 22.6* 19.8*  PLT 981* 746*   BMET Recent Labs    03/15/19 1204 03/16/19 0459  NA 133* 136  K 5.1 5.0  CL 101 103  CO2 21* 21*  GLUCOSE 160* 189*  BUN 56* 55*  CREATININE 3.24* 3.38*  CALCIUM 9.3 9.2   LFT Recent Labs    03/15/19 1204  PROT 7.9  ALBUMIN 2.5*  AST 16  ALT 10  ALKPHOS 85  BILITOT 0.5   Studies/Results: Ct Abdomen Pelvis Wo Contrast  Result Date: 03/15/2019 CLINICAL DATA:  Emesis, right lower quadrant pain, history of colon carcinoma EXAM: CT ABDOMEN AND PELVIS WITHOUT CONTRAST TECHNIQUE: Multidetector CT imaging of the abdomen and pelvis was performed following the standard protocol without IV contrast. COMPARISON:  02/09/2018 and previous FINDINGS: Lower chest: Linear scarring or subsegmental atelectasis posteriorly in the lower lobes as before. No pleural or pericardial effusion. Hepatobiliary: No focal liver abnormality is seen. Status post cholecystectomy. No biliary dilatation. Pancreas: Unremarkable. No pancreatic ductal dilatation or surrounding inflammatory changes. Spleen: Small  with coarse calcifications as before. Adrenals/Urinary Tract: Adrenal glands are unremarkable. Kidneys are normal, without renal calculi, focal lesion, or hydronephrosis. Bladder is unremarkable. Stomach/Bowel: Stomach decompressed. Small bowel decompressed. Marked wall thickening involving the terminal ileum and cecum without obstruction. Mild adjacent inflammatory/edematous changes. Regional adenopathy measured up to 3.2 cm , some nodes containing calcifications. The remainder of the colon is unremarkable. Vascular/Lymphatic: Right lower quadrant and central mesenteric adenopathy as above. No retroperitoneal or pelvic adenopathy. No significant vascular pathology identified. Reproductive: Uterus and bilateral adnexa are unremarkable. Other: Bilateral pelvic phleboliths.  No ascites.  No free air. Musculoskeletal: Extensive mottled lytic/sclerotic appearance of all visualized bones, probably related to sickle cell disease, present on studies dating back to 11/04/2016. No fracture. IMPRESSION: 1. Marked progression of wall thickening in the cecum, now also involving terminal ileum, with progressive regional adenopathy, suggesting progression of colon carcinoma. Cannot exclude superimposed typhlitis given the clinical presentation and regional inflammatory/edematous change. 2. Chronic changes of sickle cell disease in visualized bones and spleen. Electronically Signed   By: Lucrezia Europe M.D.   On: 03/15/2019 13:59   IMPRESSION:  *RLQ abdominal pain with CT scan showing likely right sided colon cancer.  Has history of colon cancer 20-25 years ago.  He reports last colonoscopy 5-6 years ago but I do not have access to those records.  Has lost 40-50 pounds recently. *Anemia:  Hgb was 7.0 grams on admission so was not transfused, but is down to 6.5 grams this AM so receiving transfusion as soon as they have good IV access.  Was admitted in August with hgb of 3.8 grams so received 4 units of PRBC's but was heme negative  at that time. *? Typhlits:  On Zosyn. *Leukocytosis:  WBC count 43.2K this AM.  This was elevated back at August admission at 24K and also elevated last year up to levels of at least 17K.  PLAN: -Colonoscopy on 10/12 with Dr. Carlean Purl with MAC sedation. -Clear liquid diet today. -Monitor Hgb and transfuse further prn.   Laban Emperor. Zehr  03/16/2019, 9:34 AM    Attending physician's note   I have taken an interval history, reviewed the chart and examined the patient. I agree with the Advanced Practitioner's note, impression and recommendations.  R colonic mass on CT without obstruction highly s/o colon cancer with LNs. °H/O colon cancer 20-25 yrs ago (unclear to the type of Sx he had). Lost to FU.  °Multiple comorbid conditions including sickle cell anemia, CKD (has AV fistula), H/O cocaine abuse, HTN, DM2, HLD. °Chronic anemia. Adm Hb 6.5 with leukocytosis (could be d/t colon cancer). Hb was 3.8 in Aug 2020. ° ° °Plan: °-Check CEA level °-Proceed with colonoscopy in a.m.  I have discussed risks and benefits in detail.  He has assured me that he will drink the entire preparation. °-Trend CBC. Keep Hb 7-8. °-Appreciate surgery consultation. °-Dr. Gessner taking over the service tomorrow.  Have scheduled colonoscopy with propofol with him. ° ° °Raj Enio Hornback, MD °[facility] °[phone]. ° ° ° ° °

## 2019-03-16 NOTE — Progress Notes (Signed)
CRITICAL VALUE ALERT  Critical Value: 7.0  Date & Time Notied: 03/16/19  Provider Notified: B.Kyere  Orders Received/Actions taken:1 unit PRBC

## 2019-03-16 NOTE — Progress Notes (Addendum)
[facility] PROGRESS NOTE  Proctor Carriker Greggs XUX:[id number] DOB: 1950-10-01 DOA: 03/15/2019 PCP: Nolene Ebbs, MD  Brief summary   HORACIO WERTH is a 68 y.o. male with medical history significant of sickle cell anemia (SS disease); ESRD not on HD; HTN; HLD; DM; and colon CA presenting with n/v.  He had a vocal cord lesion removed with CO2 laser excision during microlaryngoscopy on 10/7.  He went home and then started vomiting the evening of 10.7.  No vomiting prior to surgery.  It would come and go.  He has been losing a lot of weight and so has been drinking Boost.  Unintentional weight loss of about 50 pounds in the last 4-5 months.  He started having night sweats recently.  No LAD.  Slight diarrhea after the surgery, thought it was related to the anesthesia.  Normal BMs other than post-operatively.  Stools have been black because he has been taking iron.  He got 4 units of blood a week or two before the surgery, that increased his Hgb from 2.5 up to 8.9.  He has not yet started HD, still making urine.  His ex-wife has been caring for him for the last 3 weeks.  His colon cancer was 20 years ago.  He has never had a colonoscopy in over 25 years.  He was last admitted 8/13-15 for symptomatic anemia from CKD and given IV iron and Epogen.   ED Course:   C/o abdominal pain and vomiting.  Had laryngeal biopsy a few days ago.  WBC 40k.  CT read as progression of colon CA at cecum and TI with ?typhlitis.  Dr. Georgette Dover will see but he needs admission, antibiotics, likely GI consult.  Will also need nephrology consult for HD.  Assessment/Plan:  Colon cancer. Patient with remote h/o colon cancer (records not apparently available) without subsequent follow-up, ex-wife reports no colonoscopy in 25 years -Presented with RLQ abdominal pain, and n/v; also with 50 pounds unintentional weight loss in the last 4-5 months and recent night sweats -Imaging indicates recurrent colon cancer in the cecum/TI. This  appears to be at least stage 3 with positive LN, No obstruction on CT. CEA level-pend.  -Surgery has also been consulted. Evidence of typhlitis on CT- treat with Zosyn for now. -awaiting GI eval for colonoscopy  -acute pain control with PO dilaudid. +tylenol. Until able to get iv access   Stage 5 CKD -Patient has been preparing for HD but has not yet started. Nephrology prn order set utilized. He does not appear to be volume overloaded or otherwise in need of acute HD -Nephrology has been notified of admission. Patient has left arm non functional graft. Will need permission for picc line if unable to get iv access. Continue Phoslo, Calcitriol -addendum: d/w nephrology: Dr. Moshe Cipro. Okay for picc line if fneeded  Anemia -Likely multifactorial including h/o sickle cell disease and stage 5 CKD -Hgb was down to 3.8 on 8/13; this was thought to be related to CKD and he was transfused 4 units, but it now appears more likely that this was also related to occult GI bleeding in the setting of recurrent colon cancer (although he was heme negative on 8/13) -TF 1U PRBC ordered by surgery. Will f/u repeat labs. No s/s of acute bleeding   HTN -Continue Norvasc, Coreg  Chronic diastolic CHF -91/6606 echo with preserved EF and grade 1 diastolic dysfunction -Despite stage 5 CKD, he does not appear to be volume overloaded at this time. -If he becomes  volume overloaded, the likely treatment would be HD.  DM -hold Onglyza. Cover with sensitive-scale SSI. Pend ha1c  HLD  -Continue Lipitor  Cocaine abuse -Patient denies use since July, but was positive at the time of last admission in August. -Will check UDS. Pend   Code Status: DNR Family Communication: d/w patient, RN (indicate person spoken with, relationship, and if by phone, the number) Disposition Plan: remains inpatient    Consultants:  Surgery  GI  Procedures:  Pend colonoscopy   Antibiotics: Anti-infectives (From  admission, onward)   Start     Dose/Rate Route Frequency Ordered Stop   03/16/19 0000  piperacillin-tazobactam (ZOSYN) IVPB 2.25 g     2.25 g 100 mL/hr over 30 Minutes Intravenous Every 8 hours 03/15/19 1527     03/15/19 1430  piperacillin-tazobactam (ZOSYN) IVPB 3.375 g     3.375 g 12.5 mL/hr over 240 Minutes Intravenous  Once 03/15/19 1423 03/15/19 1915        (indicate start date, and stop date if known)  HPI/Subjective: He reports abdominal pains. No vomiting. Lost IV access. Will try oral pain regimen. Called IV team to try peripheral access. Needs blood TF. No s/s of acute bleeding  -awaiting gi eval for colonoscopy   Objective: Vitals:   03/15/19 2221 03/16/19 0443  BP: 137/72 131/78  Pulse: 81 81  Resp: 19 17  Temp: 98.2 F (36.8 C) 98.3 F (36.8 C)  SpO2: 97% 97%    Intake/Output Summary (Last 24 hours) at 03/16/2019 1127 Last data filed at 03/16/2019 3716 Gross per 24 hour  Intake 500 ml  Output 650 ml  Net -150 ml   There were no vitals filed for this visit.  Exam:   General:  No acute distress   Cardiovascular: s1,s2 rrr  Respiratory: CTA BL  Abdomen: soft, mild tender.   Musculoskeletal: no leg edema   Data Reviewed: Basic Metabolic Panel: Recent Labs  Lab 03/12/19 0759 03/15/19 1204 03/16/19 0459  NA 131* 133* 136  K 4.8 5.1 5.0  CL 99 101 103  CO2 17* 21* 21*  GLUCOSE 160* 160* 189*  BUN 68* 56* 55*  CREATININE 4.26* 3.24* 3.38*  CALCIUM 9.5 9.3 9.2   Liver Function Tests: Recent Labs  Lab 03/12/19 0759 03/15/19 1204  AST 13* 16  ALT 10 10  ALKPHOS 100 85  BILITOT 0.8 0.5  PROT 8.8* 7.9  ALBUMIN 2.5* 2.5*   Recent Labs  Lab 03/15/19 1204  LIPASE 51   No results for input(s): AMMONIA in the last 168 hours. CBC: Recent Labs  Lab 03/12/19 0758 03/15/19 1154 03/16/19 0459  WBC 34.2* 39.5* 43.2*  HGB 6.6* 7.0* 6.5*  HCT 20.4* 22.6* 19.8*  MCV 69.2* 70.4* 67.8*  PLT 782* 981* 746*   Cardiac Enzymes: No results  for input(s): CKTOTAL, CKMB, CKMBINDEX, TROPONINI in the last 168 hours. BNP (last 3 results) Recent Labs    01/16/19 1820  BNP 1,036.2*    ProBNP (last 3 results) No results for input(s): PROBNP in the last 8760 hours.  CBG: Recent Labs  Lab 03/12/19 0703 03/12/19 0824 03/12/19 0947 03/15/19 2220 03/16/19 0726  GLUCAP 162* 151* 149* 193* 174*    Recent Results (from the past 240 hour(s))  SARS CORONAVIRUS 2 (TAT 6-24 HRS) Nasopharyngeal Nasopharyngeal Swab     Status: None   Collection Time: 03/10/19  9:32 AM   Specimen: Nasopharyngeal Swab  Result Value Ref Range Status   SARS Coronavirus 2 NEGATIVE NEGATIVE Final  Comment: (NOTE) SARS-CoV-2 target nucleic acids are NOT DETECTED. The SARS-CoV-2 RNA is generally detectable in upper and lower respiratory specimens during the acute phase of infection. Negative results do not preclude SARS-CoV-2 infection, do not rule out co-infections with other pathogens, and should not be used as the sole basis for treatment or other patient management decisions. Negative results must be combined with clinical observations, patient history, and epidemiological information. The expected result is Negative. Fact Sheet for Patients: SugarRoll.be Fact Sheet for Healthcare Providers: https://www.woods-mathews.com/ This test is not yet approved or cleared by the [place] FDA and  has been authorized for detection and/or diagnosis of SARS-CoV-2 by FDA under an Emergency Use Authorization (EUA). This EUA will remain  in effect (meaning this test can be used) for the duration of the COVID-19 declaration under Section 56 4(b)(1) of the Act, 21 U.S.C. section 360bbb-3(b)(1), unless the authorization is terminated or revoked sooner. Performed at [facility], [place] [address] [place], [place] [address]   SARS CORONAVIRUS 2 (TAT 6-24 HRS) Nasopharyngeal Nasopharyngeal Swab     Status: None    Collection Time: 03/15/19  4:28 PM   Specimen: Nasopharyngeal Swab  Result Value Ref Range Status   SARS Coronavirus 2 NEGATIVE NEGATIVE Final    Comment: (NOTE) SARS-CoV-2 target nucleic acids are NOT DETECTED. The SARS-CoV-2 RNA is generally detectable in upper and lower respiratory specimens during the acute phase of infection. Negative results do not preclude SARS-CoV-2 infection, do not rule out co-infections with other pathogens, and should not be used as the sole basis for treatment or other patient management decisions. Negative results must be combined with clinical observations, patient history, and epidemiological information. The expected result is Negative. Fact Sheet for Patients: SugarRoll.be Fact Sheet for Healthcare Providers: https://www.woods-mathews.com/ This test is not yet approved or cleared by the [place] FDA and  has been authorized for detection and/or diagnosis of SARS-CoV-2 by FDA under an Emergency Use Authorization (EUA). This EUA will remain  in effect (meaning this test can be used) for the duration of the COVID-19 declaration under Section 56 4(b)(1) of the Act, 21 U.S.C. section 360bbb-3(b)(1), unless the authorization is terminated or revoked sooner. Performed at [facility], [place] [address], [place], [place] [address]      Studies: Ct Abdomen Pelvis Wo Contrast  Result Date: 03/15/2019 CLINICAL DATA:  Emesis, right lower quadrant pain, history of colon carcinoma EXAM: CT ABDOMEN AND PELVIS WITHOUT CONTRAST TECHNIQUE: Multidetector CT imaging of the abdomen and pelvis was performed following the standard protocol without IV contrast. COMPARISON:  02/09/2018 and previous FINDINGS: Lower chest: Linear scarring or subsegmental atelectasis posteriorly in the lower lobes as before. No pleural or pericardial effusion. Hepatobiliary: No focal liver abnormality is seen. Status post cholecystectomy. No  biliary dilatation. Pancreas: Unremarkable. No pancreatic ductal dilatation or surrounding inflammatory changes. Spleen: Small with coarse calcifications as before. Adrenals/Urinary Tract: Adrenal glands are unremarkable. Kidneys are normal, without renal calculi, focal lesion, or hydronephrosis. Bladder is unremarkable. Stomach/Bowel: Stomach decompressed. Small bowel decompressed. Marked wall thickening involving the terminal ileum and cecum without obstruction. Mild adjacent inflammatory/edematous changes. Regional adenopathy measured up to 3.2 cm , some nodes containing calcifications. The remainder of the colon is unremarkable. Vascular/Lymphatic: Right lower quadrant and central mesenteric adenopathy as above. No retroperitoneal or pelvic adenopathy. No significant vascular pathology identified. Reproductive: Uterus and bilateral adnexa are unremarkable. Other: Bilateral pelvic phleboliths.  No ascites.  No free air. Musculoskeletal: Extensive mottled lytic/sclerotic appearance  of all visualized bones, probably related to sickle cell disease, present on studies dating back to 11/04/2016. No fracture. IMPRESSION: 1. Marked progression of wall thickening in the cecum, now also involving terminal ileum, with progressive regional adenopathy, suggesting progression of colon carcinoma. Cannot exclude superimposed typhlitis given the clinical presentation and regional inflammatory/edematous change. 2. Chronic changes of sickle cell disease in visualized bones and spleen. Electronically Signed   By: Lucrezia Europe M.D.   On: 03/15/2019 13:59    Scheduled Meds: . sodium chloride   Intravenous Once  . amLODipine  5 mg Oral Daily  . atorvastatin  20 mg Oral Daily  . calcitRIOL  0.25 mcg Oral QODAY  . calcium acetate  667 mg Oral TID WC  . carvedilol  25 mg Oral BID WC  . docusate sodium  100 mg Oral BID  . ferrous sulfate  325 mg Oral BID  . folic acid  1 mg Oral Daily  . gabapentin  300 mg Oral TID  . insulin  aspart  0-9 Units Subcutaneous TID WC  . pantoprazole  40 mg Oral Daily   Continuous Infusions: . piperacillin-tazobactam (ZOSYN)  IV 2.25 g (03/16/19 0855)    Principal Problem:   Colon cancer ([place]) Active Problems:   Hypertension   Type 2 diabetes mellitus with renal complication (HCC)   CKD (chronic kidney disease) stage 5, GFR less than 15 ml/min (HCC)   Anemia    Time spent: >25 minutes     Kinnie Feil  [facility] Pager [phone]. If 7PM-7AM, please contact night-coverage at [email or web address], password [facility] 03/16/2019, 11:27 AM  LOS: 1 day

## 2019-03-16 NOTE — Consult Note (Addendum)
Referring Provider: No ref. provider found Primary Care Physician:  Nolene Ebbs, MD Primary Gastroenterologist:  ? Reports he had colonoscopy here in [place] 5-6 years ago but could not recall who performed it  Reason for Consultation:  Abnormal CT scan, ? Colon cancer  HPI: [name] is a 68 y.o. male with medical history significant of sickle cell anemia (SS disease); ESRD not yet on HD; HTN; HLD; DM; and colon CA 20-25 years ago.  He had a vocal cord lesion removed with CO2 laser excision during microlaryngoscopy on 10/7.  He went home and then started vomiting the evening of 10/7.  No vomiting prior to surgery.  Also started having RLQ abdominal pain.  He has been losing a lot of weight of about 50 pounds in the last 4-5 months.  He started having night sweats recently.  Stools have been black because he has been taking iron, but were not black prior to starting that and denies seeing red blood in his stools.  Also says that the iron has made him a little constipated but otherwise no major changes in his bowel movements.  He was last admitted in August, 8/13-8/15 for symptomatic anemia thought to be from CKD and given IV iron and Epogen.  Hgb of 3.8 grams that time so also received 4 units of PRBC's but was heme negative at that time.  On this admission CT scan of the abdomen and pelvis without contrast shows the following:  IMPRESSION: 1. Marked progression of wall thickening in the cecum, now also involving terminal ileum, with progressive regional adenopathy, suggesting progression of colon carcinoma. Cannot exclude superimposed typhlitis given the clinical presentation and regional inflammatory/edematous change. 2. Chronic changes of sickle cell disease in visualized bones and Spleen.  Has been started on Zosyn.  Hgb was 7.0 grams on admission so was not transfused, but is down to 6.5 grams this AM so receiving transfusion as soon as they have good IV access.  WBC count  43.2K this AM.  This was elevated back at August admission at 24K and also elevated last year up to levels of at least 17K.  Interestingly, last year he had a CT scan that showed the following:  IMPRESSION: Interval development of pathologically enlarged lymph nodes in the right retroperitoneum along the right iliac chain and posterior to the cecum.  Questionable mucosal thickening versus adherent stool to the walls of the cecum.  Marked dilation of the urinary bladder. Please correlate clinically.  Marked heterogeneous mottled sclerotic appearance of the osseous structures. This may be due to sickle cell disease or represents diffuse metastatic disease.   Past Medical History:  Diagnosis Date   Arthritis    Cancer ([place])    colon   Cataracts, bilateral    surgery to remove   Diabetes mellitus    type 2   GERD (gastroesophageal reflux disease)    Heart murmur    History of kidney stones    passed stone, no surgery   Hyperlipidemia    Hypertension    Lesion of vocal cord    Pneumonia 2019   x 2   Renal insufficiency    no dialysis - stagte 5   Sickle cell anemia (HCC)    Hgb SS disease    Wears glasses     Past Surgical History:  Procedure Laterality Date   AV FISTULA PLACEMENT Left 11/22/2016   Procedure: INSERTION OF ARTERIOVENOUS (AV) GORE-TEX GRAFT  LEFT FOREARM USING 6MM X 50CM GORETEX  GRAFT;  Surgeon: Serafina Mitchell, MD;  Location: [facility] OR;  Service: Vascular;  Laterality: Left;   CATARACT EXTRACTION W/ INTRAOCULAR LENS  IMPLANT, BILATERAL     CHOLECYSTECTOMY     CO2 LASER APPLICATION N/A 41/11/6061   Procedure: CO2 LASER EXCISION OF VOCAL FOLD LESION;  Surgeon: Melida Quitter, MD;  Location: [facility];  Service: ENT;  Laterality: N/A;   COLON SURGERY     Colon cancer surgery, removed small amt not full colectomy   COLONOSCOPY     nclear who follows him for this   DIRECT LARYNGOSCOPY N/A 03/12/2019   Procedure: SUSPENDED MICRO DIRECT  LARYNGOSCOPY;  Surgeon: Melida Quitter, MD;  Location: [place];  Service: ENT;  Laterality: N/A;   LEG SURGERY     For ? gangrene after running into barbwire fence at 68yo   MULTIPLE TOOTH EXTRACTIONS     no dentures    Prior to Admission medications   Medication Sig Start Date End Date Taking? Authorizing Provider  acetaminophen (TYLENOL) 500 MG tablet Take 500 mg by mouth every 6 (six) hours as needed for headache (pain).   Yes [provider]  amLODipine (NORVASC) 5 MG tablet Take 5 mg by mouth daily. 10/04/17  Yes [provider]  atorvastatin (LIPITOR) 20 MG tablet Take 20 mg by mouth daily.  02/01/18  Yes [provider]  calcitRIOL (ROCALTROL) 0.25 MCG capsule Take 0.25 mcg by mouth every other day. In the morning. 04/25/18  Yes [provider]  calcium acetate (PHOSLO) 667 MG capsule Take 667 mg by mouth 3 (three) times daily with meals.   Yes [provider]  carvedilol (COREG) 25 MG tablet Take 25 mg by mouth 2 (two) times daily. 09/04/17  Yes [provider]  ferrous sulfate (FEOSOL) 325 (65 FE) MG tablet Take 1 tablet (325 mg total) by mouth 2 (two) times daily with a meal. Patient taking differently: Take 325 mg by mouth 2 (two) times daily.  01/18/19  Yes Vasireddy, Grier Mitts, MD  folic acid (FOLVITE) 016 MCG tablet Take 800 mcg by mouth daily.   Yes [provider]  furosemide (LASIX) 40 MG tablet Take 40 mg by mouth See admin instructions. Morning & 1400    Yes [provider]  gabapentin (NEURONTIN) 300 MG capsule Take 1 capsule (300 mg total) by mouth at bedtime. Patient taking differently: Take 300 mg by mouth 3 (three) times daily.  01/18/19  Yes Vasireddy, Grier Mitts, MD  insulin aspart (NOVOLOG FLEXPEN) 100 UNIT/ML FlexPen Inject 2-6 Units into the skin 3 (three) times daily as needed for high blood sugar (CBG >150).   Yes [provider]  omeprazole (PRILOSEC) 20 MG capsule Take 20 mg by mouth daily.     Yes [provider]  ONGLYZA 5 MG TABS tablet Take 5 mg by mouth daily.  06/21/18  Yes [provider]  polyethylene glycol powder (MIRALAX) powder Take 17 g by mouth daily. Patient taking differently: Take 17 g by mouth daily as needed (constipation). Mix in 8 oz liquid and drink 10/28/16  Yes Palumbo, April, MD  VENTOLIN HFA 108 (90 Base) MCG/ACT inhaler Inhale 2 puffs into the lungs every 6 (six) hours as needed for wheezing.  09/03/17  Yes [provider]    Current Facility-Administered Medications  Medication Dose Route Frequency Provider Last Rate Last Dose   0.9 %  sodium chloride infusion (Manually program via Guardrails IV Fluids)   Intravenous Once Elder Cyphers, NP  acetaminophen (TYLENOL) tablet 650 mg  650 mg Oral Q6H PRN Karmen Bongo, MD       Or   acetaminophen (TYLENOL) suppository 650 mg  650 mg Rectal Q6H PRN Karmen Bongo, MD       albuterol (PROVENTIL) (2.5 MG/3ML) 0.083% nebulizer solution 2.5 mg  2.5 mg Inhalation Q6H PRN Karmen Bongo, MD       amLODipine (NORVASC) tablet 5 mg  5 mg Oral Daily Karmen Bongo, MD       atorvastatin (LIPITOR) tablet 20 mg  20 mg Oral Daily Karmen Bongo, MD       calcitRIOL (ROCALTROL) capsule 0.25 mcg  0.25 mcg Oral Cathlean Sauer, MD       calcium acetate (PHOSLO) capsule 667 mg  667 mg Oral TID WC Karmen Bongo, MD   667 mg at 03/16/19 0845   calcium carbonate (dosed in mg elemental calcium) suspension 500 mg of elemental calcium  500 mg of elemental calcium Oral Q6H PRN Karmen Bongo, MD       camphor-menthol [facility]) lotion 1 application  1 application Topical K5L PRN Karmen Bongo, MD       And   hydrOXYzine (ATARAX/VISTARIL) tablet 25 mg  25 mg Oral Q8H PRN Karmen Bongo, MD       carvedilol (COREG) tablet 25 mg  25 mg Oral BID WC Karmen Bongo, MD   25 mg at 03/16/19 0845   docusate sodium (COLACE) capsule 100 mg  100 mg Oral BID Karmen Bongo, MD   100 mg at  03/15/19 2107   docusate sodium (ENEMEEZ) enema 283 mg  1 enema Rectal PRN Karmen Bongo, MD       fentaNYL (SUBLIMAZE) injection 25 mcg  25 mcg Intravenous Q2H PRN Karmen Bongo, MD   25 mcg at 03/16/19 9357   ferrous sulfate tablet 325 mg  325 mg Oral BID Karmen Bongo, MD   325 mg at 01/77/93 9030   folic acid (FOLVITE) tablet 1 mg  1 mg Oral Daily Karmen Bongo, MD       gabapentin (NEURONTIN) capsule 300 mg  300 mg Oral TID Karmen Bongo, MD   300 mg at 03/15/19 2101   insulin aspart (novoLOG) injection 0-9 Units  0-9 Units Subcutaneous TID WC Karmen Bongo, MD   2 Units at 03/16/19 0846   ondansetron (ZOFRAN) tablet 4 mg  4 mg Oral Q6H PRN Karmen Bongo, MD       Or   ondansetron [facility]) injection 4 mg  4 mg Intravenous Q6H PRN Karmen Bongo, MD   4 mg at 03/15/19 2143   pantoprazole (PROTONIX) EC tablet 40 mg  40 mg Oral Daily Karmen Bongo, MD       piperacillin-tazobactam (ZOSYN) IVPB 2.25 g  2.25 g Intravenous Q8H Dang, Thuy D, RPH 100 mL/hr at 03/16/19 0855 2.25 g at 03/16/19 0855   sorbitol 70 % solution 30 mL  30 mL Oral PRN Karmen Bongo, MD       zolpidem Lorrin Mais) tablet 5 mg  5 mg Oral QHS PRN Karmen Bongo, MD        Allergies as of 03/15/2019 - Review Complete 03/15/2019  Allergen Reaction Noted   Morphine and related Itching 07/27/2011    Family History  Problem Relation Age of Onset   Venous thrombosis Mother        Dec. of blood clot    Diabetes type II Mother    AAA (abdominal aortic aneurysm) Mother    Throat cancer Father  Deceased of throat cancer    Social History   Socioeconomic History   Marital status: Divorced    Spouse name: Not on file   Number of children: Not on file   Years of education: Not on file   Highest education level: Not on file  Occupational History   Occupation: retired  Scientist, product/process development strain: Not on file   Food insecurity    Worry: Not on file     Inability: Not on Lexicographer needs    Medical: Not on file    Non-medical: Not on file  Tobacco Use   Smoking status: Former Smoker    Packs/day: 0.50    Years: 20.00    Pack years: 10.00    Types: Cigarettes    Quit date: 06/05/1966    Years since quitting: 52.8   Smokeless tobacco: Never Used  Substance and Sexual Activity   Alcohol use: Yes    Comment: rare beer, last use maybe July   Drug use: Not Currently    Types: Cocaine, "Crack" cocaine    Comment: Last use July 2020   Sexual activity: Not on file  Lifestyle   Physical activity    Days per week: Not on file    Minutes per session: Not on file   Stress: Not on file  Relationships   Social connections    Talks on phone: Not on file    Gets together: Not on file    Attends religious service: Not on file    Active member of club or organization: Not on file    Attends meetings of clubs or organizations: Not on file    Relationship status: Not on file   Intimate partner violence    Fear of current or ex partner: Not on file    Emotionally abused: Not on file    Physically abused: Not on file    Forced sexual activity: Not on file  Other Topics Concern   Not on file  Social History Narrative   Retired Administrator, on disability. Former smoker. Living by himself, able to get around [facility].    Has a wife who checks on him but they don't live together sicne 2007   Quit drinking     Review of Systems: ROS is O/W negative except as mentioned in HPI.  Physical Exam: Vital signs in last 24 hours: Temp:  [98.2 F (36.8 C)-99.5 F (37.5 C)] 98.3 F (36.8 C) (10/11 0443) Pulse Rate:  [66-89] 81 (10/11 0443) Resp:  [0-25] 17 (10/11 0443) BP: (116-161)/(62-94) 131/78 (10/11 0443) SpO2:  [95 %-100 %] 97 % (10/11 0443)   General:  Alert, Well-developed, well-nourished, pleasant and cooperative in NAD Head:  Normocephalic and atraumatic. Eyes:  Sclera clear, no icterus.  Conjunctiva pink. Ears:   Normal auditory acuity. Mouth:  No deformity or lesions.   Lungs:  Clear throughout to auscultation.  No wheezes, crackles, or rhonchi.  Heart:  Regular rate and rhythm; no murmurs, clicks, rubs, or gallops. Abdomen:  Soft, non-distended.  BS present.  RLQ TTP. Rectal:  Will be done at the time of colonoscopy. Msk:  Symmetrical without gross deformities. Pulses:  Normal pulses noted. Extremities:  Without clubbing or edema. Neurologic:  Alert and  oriented x4;  grossly normal neurologically. Skin:  Intact without significant lesions or rashes. Psych:  Alert and cooperative. Normal mood and affect.  Intake/Output from previous day: 10/10 0701 - 10/11 0700 In: 500 [IV [id number]:532]  Out: 500 [Urine:500] Intake/Output this shift: Total I/O In: -  Out: 150 [Urine:150]  Lab Results: Recent Labs    03/15/19 1154 03/16/19 0459  WBC 39.5* 43.2*  HGB 7.0* 6.5*  HCT 22.6* 19.8*  PLT 981* 746*   BMET Recent Labs    03/15/19 1204 03/16/19 0459  NA 133* 136  K 5.1 5.0  CL 101 103  CO2 21* 21*  GLUCOSE 160* 189*  BUN 56* 55*  CREATININE 3.24* 3.38*  CALCIUM 9.3 9.2   LFT Recent Labs    03/15/19 1204  PROT 7.9  ALBUMIN 2.5*  AST 16  ALT 10  ALKPHOS 85  BILITOT 0.5   Studies/Results: Ct Abdomen Pelvis Wo Contrast  Result Date: 03/15/2019 CLINICAL DATA:  Emesis, right lower quadrant pain, history of colon carcinoma EXAM: CT ABDOMEN AND PELVIS WITHOUT CONTRAST TECHNIQUE: Multidetector CT imaging of the abdomen and pelvis was performed following the standard protocol without IV contrast. COMPARISON:  02/09/2018 and previous FINDINGS: Lower chest: Linear scarring or subsegmental atelectasis posteriorly in the lower lobes as before. No pleural or pericardial effusion. Hepatobiliary: No focal liver abnormality is seen. Status post cholecystectomy. No biliary dilatation. Pancreas: Unremarkable. No pancreatic ductal dilatation or surrounding inflammatory changes. Spleen: Small  with coarse calcifications as before. Adrenals/Urinary Tract: Adrenal glands are unremarkable. Kidneys are normal, without renal calculi, focal lesion, or hydronephrosis. Bladder is unremarkable. Stomach/Bowel: Stomach decompressed. Small bowel decompressed. Marked wall thickening involving the terminal ileum and cecum without obstruction. Mild adjacent inflammatory/edematous changes. Regional adenopathy measured up to 3.2 cm , some nodes containing calcifications. The remainder of the colon is unremarkable. Vascular/Lymphatic: Right lower quadrant and central mesenteric adenopathy as above. No retroperitoneal or pelvic adenopathy. No significant vascular pathology identified. Reproductive: Uterus and bilateral adnexa are unremarkable. Other: Bilateral pelvic phleboliths.  No ascites.  No free air. Musculoskeletal: Extensive mottled lytic/sclerotic appearance of all visualized bones, probably related to sickle cell disease, present on studies dating back to 11/04/2016. No fracture. IMPRESSION: 1. Marked progression of wall thickening in the cecum, now also involving terminal ileum, with progressive regional adenopathy, suggesting progression of colon carcinoma. Cannot exclude superimposed typhlitis given the clinical presentation and regional inflammatory/edematous change. 2. Chronic changes of sickle cell disease in visualized bones and spleen. Electronically Signed   By: Lucrezia Europe M.D.   On: 03/15/2019 13:59   IMPRESSION:  *RLQ abdominal pain with CT scan showing likely right sided colon cancer.  Has history of colon cancer 20-25 years ago.  He reports last colonoscopy 5-6 years ago but I do not have access to those records.  Has lost 40-50 pounds recently. *Anemia:  Hgb was 7.0 grams on admission so was not transfused, but is down to 6.5 grams this AM so receiving transfusion as soon as they have good IV access.  Was admitted in August with hgb of 3.8 grams so received 4 units of PRBC's but was heme negative  at that time. *? Typhlits:  On Zosyn. *Leukocytosis:  WBC count 43.2K this AM.  This was elevated back at August admission at 24K and also elevated last year up to levels of at least 17K.  PLAN: -Colonoscopy on 10/12 with Dr. Carlean Purl with MAC sedation. -Clear liquid diet today. -Monitor Hgb and transfuse further prn.   [name]  03/16/2019, 9:34 AM    Attending physician's note   I have taken an interval history, reviewed the chart and examined the patient. I agree with the Advanced Practitioner's note, impression and recommendations.  R colonic mass on CT without obstruction highly s/o colon cancer with LNs. H/O colon cancer 20-25 yrs ago (unclear to the type of Sx he had). Lost to FU.  Multiple comorbid conditions including sickle cell anemia, CKD (has AV fistula), H/O cocaine abuse, HTN, DM2, HLD. Chronic anemia. Adm Hb 6.5 with leukocytosis (could be d/t colon cancer). Hb was 3.8 in Aug 2020.   Plan: -Check CEA level -Proceed with colonoscopy in a.m.  I have discussed risks and benefits in detail.  He has assured me that he will drink the entire preparation. -Trend CBC. Keep Hb 7-8. -Appreciate surgery consultation. -Dr. Carlean Purl taking over the service tomorrow.  Have scheduled colonoscopy with propofol with him.   Carmell Austria, MD [facility] [phone].

## 2019-03-16 NOTE — Plan of Care (Signed)
  Problem: Coping: Goal: Level of anxiety will decrease Outcome: Progressing   Problem: Pain Managment: Goal: General experience of comfort will improve Outcome: Progressing   

## 2019-03-16 NOTE — Consult Note (Signed)
[facility] Renal Consultation Note  Requesting MD: Daleen Bo Indication for Consultation: CKD  HPI:  [name] is a 68 y.o. male with past medical history significant for hypertension, diabetes mellitus, sickle cell anemia, stage IV CKD-with creatinine of at least 3.5 now for 3 years followed by Dr. Marval Regal at [facility].  He had an AV graft placed in the past which is since thrombosed.  Patient also has a history of remote colon cancer.  He presented to medical attention last night with nausea and vomiting, night sweats and unintentional weight loss as well as anemia.  CT of the abdomen showed  Marked progression of wall thickening in the cecum, now also involving terminal ileum, with progressive regional adenopathy, suggesting progression of colon carcinoma.  Has been seen by surgery and GI, colonoscopy is planned for tomorrow.  We are asked to see him for his stable CKD.  He appears hemodynamically stable, making urine.  Creatinine actually appears to be on the good side of his baseline.  Hemoglobin 6.5 this morning-getting blood.  He appears uncomfortable to me at present with abdominal pain-is trying to eat Jell-O  Creatinine, Ser  Date/Time Value Ref Range Status  03/16/2019 04:59 AM 3.38 (H) 0.61 - 1.24 mg/dL Final  03/15/2019 12:04 PM 3.24 (H) 0.61 - 1.24 mg/dL Final  03/12/2019 07:59 AM 4.26 (H) 0.61 - 1.24 mg/dL Final  01/18/2019 05:34 AM 3.35 (H) 0.61 - 1.24 mg/dL Final  01/17/2019 09:56 AM 3.41 (H) 0.61 - 1.24 mg/dL Final  01/16/2019 06:20 PM 3.60 (H) 0.61 - 1.24 mg/dL Final  02/11/2018 02:32 AM 4.37 (H) 0.61 - 1.24 mg/dL Final  02/10/2018 04:26 AM 4.41 (H) 0.61 - 1.24 mg/dL Final  02/09/2018 01:18 AM 4.93 (H) 0.61 - 1.24 mg/dL Final  02/08/2018 06:45 PM 5.11 (H) 0.61 - 1.24 mg/dL Final  11/16/2017 01:04 AM 3.64 (H) 0.61 - 1.24 mg/dL Final  11/15/2017 04:30 AM 4.18 (H) 0.61 - 1.24 mg/dL Final  11/14/2017 10:59 AM 4.04 (H) 0.61 - 1.24 mg/dL Final   11/13/2017 04:21 PM 4.09 (H) 0.61 - 1.24 mg/dL Final  04/02/2017 04:51 AM 3.94 (H) 0.61 - 1.24 mg/dL Final  04/01/2017 08:42 AM 4.43 (H) 0.61 - 1.24 mg/dL Final  03/31/2017 07:09 AM 4.76 (H) 0.61 - 1.24 mg/dL Final  03/30/2017 01:06 PM 4.93 (H) 0.61 - 1.24 mg/dL Final  03/30/2017 12:32 AM 5.02 (H) 0.61 - 1.24 mg/dL Final  03/29/2017 05:45 PM 5.13 (H) 0.61 - 1.24 mg/dL Final  03/29/2017 04:30 AM 5.01 (H) 0.61 - 1.24 mg/dL Final  03/26/2017 07:42 PM 3.42 (H) 0.61 - 1.24 mg/dL Final  01/25/2017 12:39 PM 4.43 (H) 0.61 - 1.24 mg/dL Final  01/24/2017 07:15 AM 4.20 (H) 0.61 - 1.24 mg/dL Final  01/23/2017 06:00 PM 4.36 (H) 0.61 - 1.24 mg/dL Final  12/28/2016 01:53 AM 5.26 (H) 0.61 - 1.24 mg/dL Final  12/27/2016 08:24 AM 4.79 (H) 0.61 - 1.24 mg/dL Final  12/26/2016 10:30 PM 4.83 (H) 0.61 - 1.24 mg/dL Final  11/11/2016 08:31 AM 4.36 (H) 0.61 - 1.24 mg/dL Final  11/09/2016 04:30 AM 5.34 (H) 0.61 - 1.24 mg/dL Final  11/08/2016 05:39 AM 5.26 (H) 0.61 - 1.24 mg/dL Final  11/07/2016 10:52 AM 5.41 (H) 0.61 - 1.24 mg/dL Final  11/06/2016 12:29 PM 4.99 (H) 0.61 - 1.24 mg/dL Final  11/05/2016 04:51 AM 4.53 (H) 0.61 - 1.24 mg/dL Final  11/04/2016 07:20 PM 4.63 (H) 0.61 - 1.24 mg/dL Final  10/27/2016 08:52 PM 4.72 (H) 0.61 -  1.24 mg/dL Final  06/06/2016 08:40 AM 4.12 (H) 0.61 - 1.24 mg/dL Final  06/05/2016 06:58 AM 4.17 (H) 0.61 - 1.24 mg/dL Final  06/04/2016 02:08 AM 3.79 (H) 0.61 - 1.24 mg/dL Final  06/04/2016 02:08 AM 3.82 (H) 0.61 - 1.24 mg/dL Final  06/03/2016 09:24 AM 3.68 (H) 0.61 - 1.24 mg/dL Final  06/02/2016 06:14 PM 3.93 (H) 0.61 - 1.24 mg/dL Final  03/27/2016 12:17 PM 3.40 (H) 0.61 - 1.24 mg/dL Final  03/26/2016 11:45 AM 3.19 (H) 0.61 - 1.24 mg/dL Final  03/25/2016 05:35 AM 3.70 (H) 0.61 - 1.24 mg/dL Final  07/08/2015 11:00 AM 3.02 (H) 0.61 - 1.24 mg/dL Final  09/09/2014 05:35 AM 2.53 (H) 0.50 - 1.35 mg/dL Final  09/08/2014 06:02 AM 2.65 (H) 0.50 - 1.35 mg/dL Final  09/07/2014 06:40 AM  2.62 (H) 0.50 - 1.35 mg/dL Final  09/06/2014 07:25 AM 2.62 (H) 0.50 - 1.35 mg/dL Final  09/05/2014 08:14 AM 2.68 (H) 0.50 - 1.35 mg/dL Final  09/04/2014 03:45 AM 3.17 (H) 0.50 - 1.35 mg/dL Final     PMHx:   Past Medical History:  Diagnosis Date  . Arthritis   . Cancer ([place])    colon  . Cataracts, bilateral    surgery to remove  . Diabetes mellitus    type 2  . GERD (gastroesophageal reflux disease)   . Heart murmur   . History of kidney stones    passed stone, no surgery  . Hyperlipidemia   . Hypertension   . Lesion of vocal cord   . Pneumonia 2019   x 2  . Renal insufficiency    no dialysis - stagte 5  . Sickle cell anemia ([facility])    Hgb SS disease   . Wears glasses     Past Surgical History:  Procedure Laterality Date  . AV FISTULA PLACEMENT Left 11/22/2016   Procedure: INSERTION OF ARTERIOVENOUS (AV) GORE-TEX GRAFT  LEFT FOREARM USING 6MM X 50CM GORETEX GRAFT;  Surgeon: Serafina Mitchell, MD;  Location: [facility];  Service: Vascular;  Laterality: Left;  . CATARACT EXTRACTION W/ INTRAOCULAR LENS  IMPLANT, BILATERAL    . CHOLECYSTECTOMY    . CO2 LASER APPLICATION N/A 76/12/2092   Procedure: CO2 LASER EXCISION OF VOCAL FOLD LESION;  Surgeon: Melida Quitter, MD;  Location: [facility];  Service: ENT;  Laterality: N/A;  . COLON SURGERY     Colon cancer surgery, removed small amt not full colectomy  . COLONOSCOPY     nclear who follows him for this  . DIRECT LARYNGOSCOPY N/A 03/12/2019   Procedure: SUSPENDED MICRO DIRECT LARYNGOSCOPY;  Surgeon: Melida Quitter, MD;  Location: [facility];  Service: ENT;  Laterality: N/A;  . LEG SURGERY     For ? gangrene after running into barbwire fence at 68yo  . MULTIPLE TOOTH EXTRACTIONS     no dentures    Family Hx:  Family History  Problem Relation Age of Onset  . Venous thrombosis Mother        Dec. of blood clot   . Diabetes type II Mother   . AAA (abdominal aortic aneurysm) Mother   . Throat cancer Father        Deceased of throat cancer     Social History:  reports that he quit smoking about 52 years ago. His smoking use included cigarettes. He has a 10.00 pack-year smoking history. He has never used smokeless tobacco. He reports current alcohol use. He reports previous drug use. Drugs: Cocaine and "Crack" cocaine.  Allergies:  Allergies  Allergen Reactions  . Morphine And Related Itching    Medications: Prior to Admission medications   Medication Sig Start Date End Date Taking? Authorizing Provider  acetaminophen (TYLENOL) 500 MG tablet Take 500 mg by mouth every 6 (six) hours as needed for headache (pain).   Yes [provider]  amLODipine (NORVASC) 5 MG tablet Take 5 mg by mouth daily. 10/04/17  Yes [provider]  atorvastatin (LIPITOR) 20 MG tablet Take 20 mg by mouth daily.  02/01/18  Yes [provider]  calcitRIOL (ROCALTROL) 0.25 MCG capsule Take 0.25 mcg by mouth every other day. In the morning. 04/25/18  Yes [provider]  calcium acetate (PHOSLO) 667 MG capsule Take 667 mg by mouth 3 (three) times daily with meals.   Yes [provider]  carvedilol (COREG) 25 MG tablet Take 25 mg by mouth 2 (two) times daily. 09/04/17  Yes [provider]  ferrous sulfate (FEOSOL) 325 (65 FE) MG tablet Take 1 tablet (325 mg total) by mouth 2 (two) times daily with a meal. Patient taking differently: Take 325 mg by mouth 2 (two) times daily.  01/18/19  Yes Vasireddy, Grier Mitts, MD  folic acid (FOLVITE) 086 MCG tablet Take 800 mcg by mouth daily.   Yes [provider]  furosemide (LASIX) 40 MG tablet Take 40 mg by mouth See admin instructions. Morning & 1400    Yes [provider]  gabapentin (NEURONTIN) 300 MG capsule Take 1 capsule (300 mg total) by mouth at bedtime. Patient taking differently: Take 300 mg by mouth 3 (three) times daily.  01/18/19  Yes Vasireddy, Grier Mitts, MD  insulin aspart (NOVOLOG FLEXPEN) 100 UNIT/ML FlexPen Inject 2-6 Units into the skin 3  (three) times daily as needed for high blood sugar (CBG >150).   Yes [provider]  omeprazole (PRILOSEC) 20 MG capsule Take 20 mg by mouth daily.    Yes [provider]  ONGLYZA 5 MG TABS tablet Take 5 mg by mouth daily.  06/21/18  Yes [provider]  polyethylene glycol powder (MIRALAX) powder Take 17 g by mouth daily. Patient taking differently: Take 17 g by mouth daily as needed (constipation). Mix in 8 oz liquid and drink 10/28/16  Yes Palumbo, April, MD  VENTOLIN HFA 108 (90 Base) MCG/ACT inhaler Inhale 2 puffs into the lungs every 6 (six) hours as needed for wheezing.  09/03/17  Yes [provider]    I have reviewed the patient's current medications.  Labs:  Results for orders placed or performed during the hospital encounter of 03/15/19 (from the past 48 hour(s))  CBC     Status: Abnormal   Collection Time: 03/15/19 11:54 AM  Result Value Ref Range   WBC 39.5 (H) 4.0 - 10.5 K/uL   RBC 3.21 (L) 4.22 - 5.81 MIL/uL   Hemoglobin 7.0 (L) 13.0 - 17.0 g/dL    Comment: Reticulocyte Hemoglobin testing may be clinically indicated, consider ordering this additional test [id number]    HCT 22.6 (L) 39.0 - 52.0 %   MCV 70.4 (L) 80.0 - 100.0 fL   MCH 21.8 (L) 26.0 - 34.0 pg   MCHC 31.0 30.0 - 36.0 g/dL   RDW 26.7 (H) 11.5 - 15.5 %   Platelets 981 (HH) 150 - 400 K/uL    Comment: REPEATED TO VERIFY PLATELET COUNT CONFIRMED BY SMEAR THIS CRITICAL RESULT HAS VERIFIED AND BEEN CALLED TO E.BANKS,RN BY JESSICA WEBBER ON 10 10 2020 AT 1320, AND  HAS BEEN READ BACK.     nRBC 0.5 (H) 0.0 - 0.2 %    Comment: Performed at [facility], [place] [address], [place], [place] [address]  Comprehensive metabolic panel     Status: Abnormal   Collection Time: 03/15/19 12:04 PM  Result Value Ref Range   Sodium 133 (L) 135 - 145 mmol/L   Potassium 5.1 3.5 - 5.1 mmol/L   Chloride 101 98 - 111 mmol/L   CO2 21 (L) 22 - 32 mmol/L   Glucose, Bld 160 (H) 70 - 99 mg/dL   BUN  56 (H) 8 - 23 mg/dL   Creatinine, Ser 3.24 (H) 0.61 - 1.24 mg/dL   Calcium 9.3 8.9 - 10.3 mg/dL   Total Protein 7.9 6.5 - 8.1 g/dL   Albumin 2.5 (L) 3.5 - 5.0 g/dL   AST 16 15 - 41 U/L   ALT 10 0 - 44 U/L   Alkaline Phosphatase 85 38 - 126 U/L   Total Bilirubin 0.5 0.3 - 1.2 mg/dL   GFR calc non Af Amer 18 (L) >60 mL/min   GFR calc Af Amer 21 (L) >60 mL/min   Anion gap 11 5 - 15    Comment: Performed at [facility], [place] [address] [place], [place] [address]  Lipase, blood     Status: None   Collection Time: 03/15/19 12:04 PM  Result Value Ref Range   Lipase 51 11 - 51 U/L    Comment: Performed at [facility] [address], [place], [place] [address]  SARS CORONAVIRUS 2 (TAT 6-24 HRS) Nasopharyngeal Nasopharyngeal Swab     Status: None   Collection Time: 03/15/19  4:28 PM   Specimen: Nasopharyngeal Swab  Result Value Ref Range   SARS Coronavirus 2 NEGATIVE NEGATIVE    Comment: (NOTE) SARS-CoV-2 target nucleic acids are NOT DETECTED. The SARS-CoV-2 RNA is generally detectable in upper and lower respiratory specimens during the acute phase of infection. Negative results do not preclude SARS-CoV-2 infection, do not rule out co-infections with other pathogens, and should not be used as the sole basis for treatment or other patient management decisions. Negative results must be combined with clinical observations, patient history, and epidemiological information. The expected result is Negative. Fact Sheet for Patients: SugarRoll.be Fact Sheet for Healthcare Providers: https://www.woods-mathews.com/ This test is not yet approved or cleared by the [place] FDA and  has been authorized for detection and/or diagnosis of SARS-CoV-2 by FDA under an Emergency Use Authorization (EUA). This EUA will remain  in effect (meaning this test can be used) for the duration of the COVID-19 declaration under Section 56 4(b)(1) of the  Act, 21 U.S.C. section 360bbb-3(b)(1), unless the authorization is terminated or revoked sooner. Performed at [facility], [place] [address], [place], [place] [address]   Urinalysis, Routine w reflex microscopic     Status: None   Collection Time: 03/15/19  4:30 PM  Result Value Ref Range   Color, Urine YELLOW YELLOW   APPearance CLEAR CLEAR   Specific Gravity, Urine 1.012 1.005 - 1.030   pH 5.0 5.0 - 8.0   Glucose, UA NEGATIVE NEGATIVE mg/dL   Hgb urine dipstick NEGATIVE NEGATIVE   Bilirubin Urine NEGATIVE NEGATIVE   Ketones, ur NEGATIVE NEGATIVE mg/dL   Protein, ur NEGATIVE NEGATIVE mg/dL   Nitrite NEGATIVE NEGATIVE   Leukocytes,Ua NEGATIVE NEGATIVE    Comment: Performed at [facility] [address], [place], [place] [address]  Urine rapid drug screen (hosp performed)     Status: Abnormal   Collection Time: 03/15/19  4:30 PM  Result Value Ref Range   Opiates NONE DETECTED NONE DETECTED   Cocaine NONE DETECTED NONE DETECTED   Benzodiazepines POSITIVE (A) NONE DETECTED   Amphetamines NONE DETECTED NONE DETECTED   Tetrahydrocannabinol NONE DETECTED NONE DETECTED   Barbiturates NONE DETECTED NONE DETECTED    Comment: (NOTE) DRUG SCREEN FOR MEDICAL PURPOSES ONLY.  IF CONFIRMATION IS NEEDED FOR ANY PURPOSE, NOTIFY LAB WITHIN 5 DAYS. LOWEST DETECTABLE LIMITS FOR URINE DRUG SCREEN Drug Class                     Cutoff (ng/mL) Amphetamine and metabolites    1000 Barbiturate and metabolites    200 Benzodiazepine                 751 Tricyclics and metabolites     300 Opiates and metabolites        300 Cocaine and metabolites        300 THC                            50 Performed at [facility], [place] [address], [place], [place] [address]   Glucose, capillary     Status: Abnormal   Collection Time: 03/15/19 10:20 PM  Result Value Ref Range   Glucose-Capillary 193 (H) 70 - 99 mg/dL  Basic metabolic panel     Status: Abnormal   Collection Time: 03/16/19   4:59 AM  Result Value Ref Range   Sodium 136 135 - 145 mmol/L   Potassium 5.0 3.5 - 5.1 mmol/L   Chloride 103 98 - 111 mmol/L   CO2 21 (L) 22 - 32 mmol/L   Glucose, Bld 189 (H) 70 - 99 mg/dL   BUN 55 (H) 8 - 23 mg/dL   Creatinine, Ser 3.38 (H) 0.61 - 1.24 mg/dL   Calcium 9.2 8.9 - 10.3 mg/dL   GFR calc non Af Amer 17 (L) >60 mL/min   GFR calc Af Amer 20 (L) >60 mL/min   Anion gap 12 5 - 15    Comment: Performed at [facility] [address] [place], [place] [address]  CBC     Status: Abnormal   Collection Time: 03/16/19  4:59 AM  Result Value Ref Range   WBC 43.2 (H) 4.0 - 10.5 K/uL   RBC 2.92 (L) 4.22 - 5.81 MIL/uL   Hemoglobin 6.5 (LL) 13.0 - 17.0 g/dL    Comment: REPEATED TO VERIFY Reticulocyte Hemoglobin testing may be clinically indicated, consider ordering this additional test [id number] THIS CRITICAL RESULT HAS VERIFIED AND BEEN CALLED TO N.IRISH,RN BY MELISSA BROGDON ON 10 11 2020 AT 0541, AND HAS BEEN READ BACK.     HCT 19.8 (L) 39.0 - 52.0 %   MCV 67.8 (L) 80.0 - 100.0 fL   MCH 22.3 (L) 26.0 - 34.0 pg   MCHC 32.8 30.0 - 36.0 g/dL   RDW 26.1 (H) 11.5 - 15.5 %   Platelets 746 (H) 150 - 400 K/uL    Comment: REPEATED TO VERIFY PLATELET COUNT CONFIRMED BY SMEAR SPECIMEN CHECKED FOR CLOTS    nRBC 0.3 (H) 0.0 - 0.2 %    Comment: Performed at [facility] [address], [place], [place] [address]  Type and screen [facility]     Status:  None (Preliminary result)   Collection Time: 03/16/19  6:03 AM  Result Value Ref Range   ABO/RH(D) AB POS    Antibody Screen NEG    Sample Expiration 03/19/2019,2359    Unit Number [id number]    Blood Component Type RED CELLS,LR    Unit division 00    Status of Unit ALLOCATED    Transfusion Status OK TO TRANSFUSE    Crossmatch Result      Compatible Performed at [facility], [place] [address], [place], [place] [address]   Prepare RBC     Status: None   Collection Time: 03/16/19  6:05 AM   Result Value Ref Range   Order Confirmation      ORDER PROCESSED BY BLOOD BANK Performed at [facility], [place] [address], [place], [place] [address]   Glucose, capillary     Status: Abnormal   Collection Time: 03/16/19  7:26 AM  Result Value Ref Range   Glucose-Capillary 174 (H) 70 - 99 mg/dL  Glucose, capillary     Status: Abnormal   Collection Time: 03/16/19 11:41 AM  Result Value Ref Range   Glucose-Capillary 108 (H) 70 - 99 mg/dL     ROS:  A comprehensive review of systems was negative except for: Constitutional: positive for night sweats and weight loss Gastrointestinal: positive for abdominal pain, nausea and vomiting  Physical Exam: Vitals:   03/16/19 0443 03/16/19 1303  BP: 131/78 (!) 141/64  Pulse: 81 75  Resp: 17 15  Temp: 98.3 F (36.8 C) 99.8 F (37.7 C)  SpO2: 97% 96%     General: Weak appearing African-American male-complaining of abdominal pain, wincing every so often HEENT: Pupils are equally round and reactive to light, extraocular motions are intact, mucous membranes are moist Neck: No JVD Heart: Regular rate and rhythm Lungs: Clear to auscultation bilaterally Abdomen: Distended, tender especially at the right lower quadrant Extremities: No peripheral edema Skin: Warm and dry Neuro: Alert and nonfocal  Assessment/Plan: 68 year old black male with diabetes mellitus, hypertension, and sickle cell anemia which is led to stable CKD.  He now presents with likely advanced colon cancer 1.Renal-patient has stable stage IV CKD this been stable for the last 3 years.  There are no indications for dialysis.  If it is true that he has metastatic colon cancer it likely makes him not a candidate to receive dialysis but that decision does not need to be made today 2. Hypertension/volume  -seems adequate at this time.  He is on Norvasc 5 and carvedilol 25 twice daily.  He does still seem to need 3. Anemia  -I would imagine his GI bleeding.  Support with  transfusions.  He is on oral iron which I can imagine is helping too much at this time-I have stopped it and will give him a dose of IV iron today.  No ESA in the setting of active cancer 4.  Electrolytes-potassium high normal.  I would hope that being n.p.o. will bring that down.  He does not need any treatment for that today.  He is taking his calcitriol and PhosLo given to him by his kidney doctor.  In an effort to just decrease the amount of oral meds that he needs to take at this time I will put those on hold  Dr. Marval Regal is on tomorrow so he will likely socially visit the patient.  Not sure that he needs to be followed chronically during this hospitalization by [place] if kidney function does not  change   Louis Meckel 03/16/2019, 1:26 PM

## 2019-03-17 ENCOUNTER — Inpatient Hospital Stay (HOSPITAL_COMMUNITY): Payer: Medicare HMO

## 2019-03-17 ENCOUNTER — Inpatient Hospital Stay (HOSPITAL_COMMUNITY): Payer: Medicare HMO | Admitting: Certified Registered Nurse Anesthetist

## 2019-03-17 ENCOUNTER — Encounter (HOSPITAL_COMMUNITY)
Admission: EM | Disposition: A | Discharge status: Home / Self Care | Payer: Self-pay | Source: Home / Self Care | Attending: Family Medicine

## 2019-03-17 ENCOUNTER — Encounter (HOSPITAL_COMMUNITY): Payer: Self-pay | Admitting: Emergency Medicine

## 2019-03-17 DIAGNOSIS — C182 Malignant neoplasm of ascending colon: Secondary | ICD-10-CM | POA: Diagnosis not present

## 2019-03-17 HISTORY — PX: IR FLUORO GUIDE CV LINE RIGHT: IMG2283

## 2019-03-17 HISTORY — PX: IR US GUIDE VASC ACCESS RIGHT: IMG2390

## 2019-03-17 LAB — CBC
HCT: 20.2 % — ABNORMAL LOW (ref 39.0–52.0)
Hemoglobin: 6.5 g/dL — CL (ref 13.0–17.0)
MCH: 23.3 pg — ABNORMAL LOW (ref 26.0–34.0)
MCHC: 32.2 g/dL (ref 30.0–36.0)
MCV: 72.4 fL — ABNORMAL LOW (ref 80.0–100.0)
Platelets: 593 10*3/uL — ABNORMAL HIGH (ref 150–400)
RBC: 2.79 MIL/uL — ABNORMAL LOW (ref 4.22–5.81)
RDW: 27.7 % — ABNORMAL HIGH (ref 11.5–15.5)
WBC: 33.4 10*3/uL — ABNORMAL HIGH (ref 4.0–10.5)
nRBC: 0.1 % (ref 0.0–0.2)

## 2019-03-17 LAB — HEMOGLOBIN A1C
Hgb A1c MFr Bld: 6.9 % — ABNORMAL HIGH (ref 4.8–5.6)
Mean Plasma Glucose: 151 mg/dL

## 2019-03-17 LAB — BASIC METABOLIC PANEL
Anion gap: 13 (ref 5–15)
BUN: 58 mg/dL — ABNORMAL HIGH (ref 8–23)
CO2: 18 mmol/L — ABNORMAL LOW (ref 22–32)
Calcium: 9.2 mg/dL (ref 8.9–10.3)
Chloride: 107 mmol/L (ref 98–111)
Creatinine, Ser: 3.76 mg/dL — ABNORMAL HIGH (ref 0.61–1.24)
GFR calc Af Amer: 18 mL/min — ABNORMAL LOW (ref >60)
GFR calc non Af Amer: 15 mL/min — ABNORMAL LOW (ref >60)
Glucose, Bld: 175 mg/dL — ABNORMAL HIGH (ref 70–99)
Potassium: 4.4 mmol/L (ref 3.5–5.1)
Sodium: 138 mmol/L (ref 135–145)

## 2019-03-17 LAB — GLUCOSE, CAPILLARY
Glucose-Capillary: 171 mg/dL — ABNORMAL HIGH (ref 70–99)
Glucose-Capillary: 141 mg/dL — ABNORMAL HIGH (ref 70–99)
Glucose-Capillary: 148 mg/dL — ABNORMAL HIGH (ref 70–99)
Glucose-Capillary: 246 mg/dL — ABNORMAL HIGH (ref 70–99)

## 2019-03-17 LAB — PATHOLOGIST SMEAR REVIEW

## 2019-03-17 LAB — PREPARE RBC (CROSSMATCH)

## 2019-03-17 LAB — CEA: CEA: 82.7 ng/mL — ABNORMAL HIGH (ref 0.0–4.7)

## 2019-03-17 SURGERY — CANCELLED PROCEDURE
Anesthesia: Monitor Anesthesia Care

## 2019-03-17 MED ORDER — LIDOCAINE HCL 1 % IJ SOLN
INTRAMUSCULAR | Status: AC
  Filled 2019-03-17: qty 20

## 2019-03-17 MED ORDER — SODIUM CHLORIDE 0.9% IV SOLUTION: Freq: Once | INTRAVENOUS | Status: DC

## 2019-03-17 MED ORDER — LIDOCAINE HCL 1 % IJ SOLN
INTRAMUSCULAR | Status: DC | PRN
  Administered 2019-03-17: 5 mL

## 2019-03-17 MED ORDER — BOOST / RESOURCE BREEZE PO LIQD CUSTOM
1.0000 | Freq: Three times a day (TID) | ORAL | Status: DC
  Administered 2019-03-18 – 2019-03-20 (×5): 1 via ORAL

## 2019-03-17 MED ORDER — HYDROMORPHONE HCL 1 MG/ML IJ SOLN
1.0000 mg | INTRAMUSCULAR | Status: DC | PRN
  Administered 2019-03-17 – 2019-03-23 (×21): 1 mg via INTRAVENOUS
  Filled 2019-03-17 (×21): qty 1

## 2019-03-17 SURGICAL SUPPLY — 22 items

## 2019-03-17 NOTE — Progress Notes (Signed)
    No IV access on this patient- numerous attempts but could not get IV  Will try to do colonoscopy tomorrow after IV access established.  Patient and [facility] aware of plan  Gatha Mayer, MD, [facility] 03/17/2019 1:51 PM Pager [phone]

## 2019-03-17 NOTE — Progress Notes (Signed)
IR was called around 420 pm regarding the need for central line. I have talk with the MD on call and she stated that she will  put the patient on the list for placement then the critical Md team came on the floor to insert a line. Critical care team unable to put line in, received a call from the attending to call IV team for another assessment for possible PICC. IV team paged and order in for possible PICC . Awaiting response from IV team

## 2019-03-17 NOTE — Progress Notes (Signed)
I was informed by primary RN that IV team was unsuccessful in establishing IV line on this gentleman. I was also told that they would not be able to do a picc line for the same reason. I then consulted IR to place a central line. I received a call back from Dr. Anselm Pancoast From IR at 4:30 PM and he mentioned that since patient is on the floor so I need to call PCCM first to establish central line. I called PCCM and spoke to Dr. Carlis Abbott at [facility]. She had stated that she will take care of that. I then received a call from Dr. Carlis Abbott at 5:45 PM stating that they tried to do ultrasound guided peripheral line 3 times but that was unsuccessful and that PCCM is not allowed to place central line on a patient who is on the floor. I was told that PCCM is only allowed to do central lines in intensive care unit or ER. I was asked to call IR back. I tried to call IR back but since this wa s after hours, I could not get a hold of anyone. I then called primary RN to call IV team and try to see if they can try again to place Picc line. I have spent approximately 40 minutes on this today.

## 2019-03-17 NOTE — Progress Notes (Signed)
[facility] PROGRESS NOTE  [name] GGY:[id number] DOB: 18-Jun-1950 DOA: 03/15/2019 PCP: Nolene Ebbs, MD  Brief summary   [name] is a 68 y.o. male with medical history significant of sickle cell anemia (SS disease); ESRD not on HD; HTN; HLD; DM; and colon CA presenting with n/v.  He had a vocal cord lesion removed with CO2 laser excision during microlaryngoscopy on 10/7.  He went home and then started vomiting the evening of 10.7.  No vomiting prior to surgery.  It would come and go.  He has been losing a lot of weight and so has been drinking Boost.  Unintentional weight loss of about 50 pounds in the last 4-5 months.  He started having night sweats recently.  No LAD.  Slight diarrhea after the surgery, thought it was related to the anesthesia.  Normal BMs other than post-operatively.  Stools have been black because he has been taking iron.  He got 4 units of blood a week or two before the surgery, that increased his Hgb from 2.5 up to 8.9.  He has not yet started HD, still making urine.  His ex-wife has been caring for him for the last 3 weeks.  His colon cancer was 20 years ago.  He has never had a colonoscopy in over 25 years.  He was last admitted 8/13-15 for symptomatic anemia from CKD and given IV iron and Epogen.   ED Course:   C/o abdominal pain and vomiting.  Had laryngeal biopsy a few days ago.  WBC 40k.  CT read as progression of colon CA at cecum and TI with ?typhlitis.  Dr. Georgette Dover will see but he needs admission, antibiotics, likely GI consult.  Will also need nephrology consult for HD.  Assessment/Plan:  Colon cancer. Patient with remote h/o colon cancer (records not apparently available) without subsequent follow-up, ex-wife reports no colonoscopy in 25 years -Presented with RLQ abdominal pain, and n/v; also with 50 pounds unintentional weight loss in the last 4-5 months and recent night sweats -Imaging indicates recurrent colon cancer in the cecum/TI. This  appears to be at least stage 3 with positive LN, No obstruction on CT. CEA level-elevated -Surgery has also been consulted. Evidence of typhlitis on CT- treat with Zosyn for now. -Seen by GI and plan for colonoscopy today.  Continues of abdominal pain.  Fentanyl is not working.  We will stop that and put him on Dilaudid as needed.  Stage 5 CKD -Patient has been preparing for HD but has not yet started. Nephrology prn order set utilized.  Seen by nephrology.  His renal function is at his baseline.  No indication of any intervention at this point in time.   Anemia -Likely multifactorial including h/o sickle cell disease and stage 5 CKD but most likely due to his recurrence of colon cancer.  Received 1 unit of PRBC on 03/16/2019 and 1 in process right now.  Will repeat H&H after that.  HTN -Blood pressure controlled.  Continue Norvasc, Coreg  Chronic diastolic CHF -08/5007 echo with preserved EF and grade 1 diastolic dysfunction -Despite stage 5 CKD, he does not appear to be volume overloaded at this time. -If he becomes volume overloaded, the likely treatment would be HD.  DM -hold Onglyza. Cover with sensitive-scale SSI.  Blood sugar fluctuating but acceptable so far.  HLD  -Continue Lipitor  Cocaine abuse -Patient denies use since July, but was positive at the time of last admission in August. -UDS negative this time.  Code Status: DNR Family Communication: Plan of care discussed with patient.  He had no further questions. Disposition Plan: remains inpatient    Consultants:  Surgery  GI  Procedures:  Pend colonoscopy   Antibiotics: Anti-infectives (From admission, onward)   Start     Dose/Rate Route Frequency Ordered Stop   03/16/19 0000  piperacillin-tazobactam (ZOSYN) IVPB 2.25 g     2.25 g 100 mL/hr over 30 Minutes Intravenous Every 8 hours 03/15/19 1527     03/15/19 1430  piperacillin-tazobactam (ZOSYN) IVPB 3.375 g     3.375 g 12.5 mL/hr over 240 Minutes  Intravenous  Once 03/15/19 1423 03/15/19 1915       (indicate start date, and stop date if known)  HPI/Subjective: Patient seen and examined.  He complained of left foot pain.  No history of gout.  Also complained of abdominal pain.  Objective: Vitals:   03/17/19 1023 03/17/19 1023  BP:  (!) 143/70  Pulse:  66  Resp: 18   Temp:  98.8 F (37.1 C)  SpO2:  95%    Intake/Output Summary (Last 24 hours) at 03/17/2019 1052 Last data filed at 03/17/2019 0800 Gross per 24 hour  Intake 576 ml  Output 280 ml  Net 296 ml   There were no vitals filed for this visit.  Exam:  General exam: Appears calm and comfortable  Respiratory system: Clear to auscultation. Respiratory effort normal. Cardiovascular system: S1 & S2 heard, RRR. No JVD, murmurs, rubs, gallops or clicks. No pedal edema. Gastrointestinal system: Abdomen is nondistended, from and tender generalized. No organomegaly or masses felt. Normal bowel sounds heard. Central nervous system: Alert and oriented. No focal neurological deficits. Extremities: Symmetric 5 x 5 power.  Mild tenderness at the base of left great toe but no redness or warmth or any swelling. Skin: No rashes, lesions or ulcers.  Psychiatry: Judgement and insight appear normal. Mood & affect appropriate.    Data Reviewed: Basic Metabolic Panel: Recent Labs  Lab 03/12/19 0759 03/15/19 1204 03/16/19 0459 03/17/19 0346  NA 131* 133* 136 138  K 4.8 5.1 5.0 4.4  CL 99 101 103 107  CO2 17* 21* 21* 18*  GLUCOSE 160* 160* 189* 175*  BUN 68* 56* 55* 58*  CREATININE 4.26* 3.24* 3.38* 3.76*  CALCIUM 9.5 9.3 9.2 9.2   Liver Function Tests: Recent Labs  Lab 03/12/19 0759 03/15/19 1204  AST 13* 16  ALT 10 10  ALKPHOS 100 85  BILITOT 0.8 0.5  PROT 8.8* 7.9  ALBUMIN 2.5* 2.5*   Recent Labs  Lab 03/15/19 1204  LIPASE 51   No results for input(s): AMMONIA in the last 168 hours. CBC: Recent Labs  Lab 03/12/19 0758 03/15/19 1154 03/16/19 0459  03/17/19 0346  WBC 34.2* 39.5* 43.2* 33.4*  HGB 6.6* 7.0* 6.5* 6.5*  HCT 20.4* 22.6* 19.8* 20.2*  MCV 69.2* 70.4* 67.8* 72.4*  PLT 782* 981* 746* 593*   Cardiac Enzymes: No results for input(s): CKTOTAL, CKMB, CKMBINDEX, TROPONINI in the last 168 hours. BNP (last 3 results) Recent Labs    01/16/19 1820  BNP 1,036.2*    ProBNP (last 3 results) No results for input(s): PROBNP in the last 8760 hours.  CBG: Recent Labs  Lab 03/15/19 2220 03/16/19 0726 03/16/19 1141 03/16/19 1644 03/17/19 0842  GLUCAP 193* 174* 108* 226* 141*    Recent Results (from the past 240 hour(s))  SARS CORONAVIRUS 2 (TAT 6-24 HRS) Nasopharyngeal Nasopharyngeal Swab     Status: None  Collection Time: 03/10/19  9:32 AM   Specimen: Nasopharyngeal Swab  Result Value Ref Range Status   SARS Coronavirus 2 NEGATIVE NEGATIVE Final    Comment: (NOTE) SARS-CoV-2 target nucleic acids are NOT DETECTED. The SARS-CoV-2 RNA is generally detectable in upper and lower respiratory specimens during the acute phase of infection. Negative results do not preclude SARS-CoV-2 infection, do not rule out co-infections with other pathogens, and should not be used as the sole basis for treatment or other patient management decisions. Negative results must be combined with clinical observations, patient history, and epidemiological information. The expected result is Negative. Fact Sheet for Patients: SugarRoll.be Fact Sheet for Healthcare Providers: https://www.woods-mathews.com/ This test is not yet approved or cleared by the [place] FDA and  has been authorized for detection and/or diagnosis of SARS-CoV-2 by FDA under an Emergency Use Authorization (EUA). This EUA will remain  in effect (meaning this test can be used) for the duration of the COVID-19 declaration under Section 56 4(b)(1) of the Act, 21 U.S.C. section 360bbb-3(b)(1), unless the authorization is terminated  or revoked sooner. Performed at [facility], [place] [address], [place], [place] [address]   SARS CORONAVIRUS 2 (TAT 6-24 HRS) Nasopharyngeal Nasopharyngeal Swab     Status: None   Collection Time: 03/15/19  4:28 PM   Specimen: Nasopharyngeal Swab  Result Value Ref Range Status   SARS Coronavirus 2 NEGATIVE NEGATIVE Final    Comment: (NOTE) SARS-CoV-2 target nucleic acids are NOT DETECTED. The SARS-CoV-2 RNA is generally detectable in upper and lower respiratory specimens during the acute phase of infection. Negative results do not preclude SARS-CoV-2 infection, do not rule out co-infections with other pathogens, and should not be used as the sole basis for treatment or other patient management decisions. Negative results must be combined with clinical observations, patient history, and epidemiological information. The expected result is Negative. Fact Sheet for Patients: SugarRoll.be Fact Sheet for Healthcare Providers: https://www.woods-mathews.com/ This test is not yet approved or cleared by the [place] FDA and  has been authorized for detection and/or diagnosis of SARS-CoV-2 by FDA under an Emergency Use Authorization (EUA). This EUA will remain  in effect (meaning this test can be used) for the duration of the COVID-19 declaration under Section 56 4(b)(1) of the Act, 21 U.S.C. section 360bbb-3(b)(1), unless the authorization is terminated or revoked sooner. Performed at [facility], [place] [address], [place], [place] [address]      Studies: Ct Abdomen Pelvis Wo Contrast  Result Date: 03/15/2019 CLINICAL DATA:  Emesis, right lower quadrant pain, history of colon carcinoma EXAM: CT ABDOMEN AND PELVIS WITHOUT CONTRAST TECHNIQUE: Multidetector CT imaging of the abdomen and pelvis was performed following the standard protocol without IV contrast. COMPARISON:  02/09/2018 and previous FINDINGS: Lower chest: Linear scarring  or subsegmental atelectasis posteriorly in the lower lobes as before. No pleural or pericardial effusion. Hepatobiliary: No focal liver abnormality is seen. Status post cholecystectomy. No biliary dilatation. Pancreas: Unremarkable. No pancreatic ductal dilatation or surrounding inflammatory changes. Spleen: Small with coarse calcifications as before. Adrenals/Urinary Tract: Adrenal glands are unremarkable. Kidneys are normal, without renal calculi, focal lesion, or hydronephrosis. Bladder is unremarkable. Stomach/Bowel: Stomach decompressed. Small bowel decompressed. Marked wall thickening involving the terminal ileum and cecum without obstruction. Mild adjacent inflammatory/edematous changes. Regional adenopathy measured up to 3.2 cm , some nodes containing calcifications. The remainder of the colon is unremarkable. Vascular/Lymphatic: Right lower quadrant and central mesenteric adenopathy as above. No retroperitoneal or pelvic adenopathy.  No significant vascular pathology identified. Reproductive: Uterus and bilateral adnexa are unremarkable. Other: Bilateral pelvic phleboliths.  No ascites.  No free air. Musculoskeletal: Extensive mottled lytic/sclerotic appearance of all visualized bones, probably related to sickle cell disease, present on studies dating back to 11/04/2016. No fracture. IMPRESSION: 1. Marked progression of wall thickening in the cecum, now also involving terminal ileum, with progressive regional adenopathy, suggesting progression of colon carcinoma. Cannot exclude superimposed typhlitis given the clinical presentation and regional inflammatory/edematous change. 2. Chronic changes of sickle cell disease in visualized bones and spleen. Electronically Signed   By: Lucrezia Europe M.D.   On: 03/15/2019 13:59    Scheduled Meds: . sodium chloride   Intravenous Once  . sodium chloride   Intravenous Once  . amLODipine  5 mg Oral Daily  . atorvastatin  20 mg Oral Daily  . carvedilol  25 mg Oral BID  WC  . docusate sodium  100 mg Oral BID  . folic acid  1 mg Oral Daily  . gabapentin  300 mg Oral TID  . insulin aspart  0-9 Units Subcutaneous TID WC  . metoCLOPramide (REGLAN) injection  10 mg Intravenous Once  . pantoprazole  40 mg Oral Daily   Continuous Infusions: . piperacillin-tazobactam (ZOSYN)  IV 2.25 g (03/17/19 0758)    Principal Problem:   Colon cancer ([place]) Active Problems:   Hypertension   Type 2 diabetes mellitus with renal complication (HCC)   CKD (chronic kidney disease) stage 5, GFR less than 15 ml/min (HCC)   Anemia  Time spent: 31 minutes  [facility] Pager [phone]. If 7PM-7AM, please contact night-coverage at [email or web address], password [facility] 03/17/2019, 10:52 AM  LOS: 2 days

## 2019-03-17 NOTE — Progress Notes (Signed)
Attempt to place PIV unsuccessful by two IVRN.  Both arms assessed with ultrasound - unable to find any suitable veins.

## 2019-03-17 NOTE — H&P (View-Only) (Signed)
    No IV access on this patient- numerous attempts but could not get IV  Will try to do colonoscopy tomorrow after IV access established.  Patient and [facility] aware of plan  Gatha Mayer, MD, [facility] 03/17/2019 1:51 PM Pager [phone]

## 2019-03-17 NOTE — Interval H&P Note (Signed)
History and Physical Interval Note:  03/17/2019 1:00 PM  [name]  has presented today for surgery, with the diagnosis of Abnormal CT scan, history of colon cancer, anemia, weight loss, abdominal pain.  The various methods of treatment have been discussed with the patient and family. After consideration of risks, benefits and other options for treatment, the patient has consented to  Procedure(s): COLONOSCOPY WITH PROPOFOL (N/A) as a surgical intervention.  The patient's history has been reviewed, patient examined, no change in status, stable for surgery.  I have reviewed the patient's chart and labs.  Questions were answered to the patient's satisfaction.     Silvano Rusk

## 2019-03-17 NOTE — Progress Notes (Signed)
3 PCCM providers made multiple attempts at US guided PIV access including EJ /s success.   Francine Graven, MSN, AGACNP  [facility]

## 2019-03-17 NOTE — Progress Notes (Signed)
Initial Nutrition Assessment  DOCUMENTATION CODES:   Not applicable  INTERVENTION:   -Boost Breeze po TID, each supplement provides 250 kcal and 9 grams of protein -RD will follow for diet advancement and supplement as appropriate   NUTRITION DIAGNOSIS:   Inadequate oral intake related to altered GI function as evidenced by NPO status.  GOAL:   Patient will meet greater than or equal to 90% of their needs  MONITOR:   PO intake, Supplement acceptance, Diet advancement, Labs, Weight trends, Skin, I & O's  REASON FOR ASSESSMENT:   Malnutrition Screening Tool    ASSESSMENT:   [name] is a 68 y.o. male with medical history significant of sickle cell anemia (SS disease); ESRD not on HD; HTN; HLD; DM; and colon CA presenting with n/v.  He had a vocal cord lesion removed with CO2 laser excision during microlaryngoscopy on 10/7.  He went home and then started vomiting the evening of 10.7.  No vomiting prior to surgery.  It would come and go.  He has been losing a lot of weight and so has been drinking Boost.  Unintentional weight loss of about 50 pounds in the last 4-5 months.  He started having night sweats recently.  No LAD.  Slight diarrhea after the surgery, thought it was related to the anesthesia.  Normal BMs other than post-operatively.  Stools have been black because he has been taking iron.  He got 4 units of blood a week or two before the surgery, that increased his Hgb from 2.5 up to 8.9.  He has not yet started HD, still making urine.  Pt admitted with RLQ abdominal pain and suspicion of colon cancer.   Reviewed I/O's: +146 ml x 24 hours  UOP: 430 ml x 24 hours  Per general surgery notes, CT and elevated CEA highly suspicious for metastatic colon cancer.   Per GI, plan for colonoscopy tomorrow. Unable to complete today secondary to lack of IV access.   Pt out of room at time of visit. Unable to obtain further nutrition-related history at this time. Per H&P, pt  with poor appetite PTA and was consuming Boost due to weight loss.   Reviewed wt hx; noted pt has experienced a 16% wt loss over the past year. While this is not significant for time frame, it is concerning given possible cancer diagnosis and poor appetite.   Per MD notes, may consider transition to hospice after cancer work-up.   Lab Results  Component Value Date   HGBA1C 6.9 (H) 03/15/2019   PTA DM medications are 2-6 units insulin aspart TID with meals.   Labs reviewed: CBGS: 141-226 (inpatient orders for glycemic control are 0-9 units insulin aspart TID with meals).   Diet Order:   Diet Order            Diet NPO time specified Except for: Sips with Meds  Diet effective midnight        Diet clear liquid Room service appropriate? Yes; Fluid consistency: Thin  Diet effective now              EDUCATION NEEDS:   No education needs have been identified at this time  Skin:  Skin Assessment: Reviewed RN Assessment  Last BM:  03/17/19  Height:   Ht Readings from Last 1 Encounters:  03/10/19 6\' 1"  (1.854 m)    Weight:   Wt Readings from Last 1 Encounters:  03/10/19 64 kg    Ideal Body Weight:  83.6 kg  BMI:  There is no height or weight on file to calculate BMI.  Estimated Nutritional Needs:   Kcal:  2050-2250  Protein:  110-125 grams  Fluid:  2-2.2 L    Dublin Cantero A. Jimmye Norman, RD, LDN, [place] Registered Dietitian II Certified Diabetes Care and Education Specialist Pager: [phone] After hours Pager: [phone]

## 2019-03-17 NOTE — Progress Notes (Addendum)
[facility] Progress Note  Day of Surgery   Assessment/Plan Hx of colon cancer s/p some type of colon resection in 1982 Hx of sickle cell anemia ESRD not on HD DM HTN HLD CHF Cocaine abuse Anemia -Hgb 6.5 this a.m. transfusion as needed per medicine  RLQ abdominal pain and anemia - CT scan showed wall thickening in the cecum, now also involving terminal ileum, with progressive regional adenopathy, suggesting progression of colon carcinoma - Patient is to undergo colonoscopy this a.m. - CEA 82.7 - We will await results of colonoscopy prior to surgical planning - We will follow Left foot pain - Per medicine, gout?  FEN: N.p.o. VTE: SCD's, chemical prophylaxis per medicine ID: Zosyn 10/10>>   WBC 33.4, afebrile Foley: None Follow up: TBD    LOS: 2 days    Subjective: CC: Abdominal pain  Abdominal pain is about the same.  Patient denies nausea, vomiting, fever or chills.  Patient is complaining more of left foot pain.  Sudden onset last evening of left foot pain.  He denies trauma.  He denies history of gout.  Pain worse with applying pressure/standing on foot.  He states his brother does have gout.  Objective: Vital signs in last 24 hours: Temp:  [97.8 F (36.6 C)-99.8 F (37.7 C)] 97.8 F (36.6 C) (10/12 0522) Pulse Rate:  [70-78] 70 (10/12 0522) Resp:  [15-18] 18 (10/12 0522) BP: (112-141)/(59-79) 128/70 (10/12 0522) SpO2:  [96 %-100 %] 97 % (10/11 2056) Last BM Date: 03/16/19  Intake/Output from previous day: 10/11 0701 - 10/12 0700 In: 576 [P.O.:100; Blood:476] Out: 430 [Urine:430] Intake/Output this shift: No intake/output data recorded.  PE: Gen:  Alert, NAD, pleasant, cooperative Pulm: Rate and effort normal Abd: Soft, ND, +BS, no HSM, TTP in RLQ with guarding.  No peritonitis Extremities: No BLE edema.  Left foot with mild swelling but no redness or increased warmth. Skin: no rashes noted, warm and  dry   Anti-infectives: Anti-infectives (From admission, onward)   Start     Dose/Rate Route Frequency Ordered Stop   03/16/19 0000  piperacillin-tazobactam (ZOSYN) IVPB 2.25 g     2.25 g 100 mL/hr over 30 Minutes Intravenous Every 8 hours 03/15/19 1527     03/15/19 1430  piperacillin-tazobactam (ZOSYN) IVPB 3.375 g     3.375 g 12.5 mL/hr over 240 Minutes Intravenous  Once 03/15/19 1423 03/15/19 1915      Lab Results:  Recent Labs    03/16/19 0459 03/17/19 0346  WBC 43.2* 33.4*  HGB 6.5* 6.5*  HCT 19.8* 20.2*  PLT 746* 593*   BMET Recent Labs    03/16/19 0459 03/17/19 0346  NA 136 138  K 5.0 4.4  CL 103 107  CO2 21* 18*  GLUCOSE 189* 175*  BUN 55* 58*  CREATININE 3.38* 3.76*  CALCIUM 9.2 9.2   PT/INR No results for input(s): LABPROT, INR in the last 72 hours. CMP     Component Value Date/Time   NA 138 03/17/2019 0346   K 4.4 03/17/2019 0346   CL 107 03/17/2019 0346   CO2 18 (L) 03/17/2019 0346   GLUCOSE 175 (H) 03/17/2019 0346   BUN 58 (H) 03/17/2019 0346   CREATININE 3.76 (H) 03/17/2019 0346   CALCIUM 9.2 03/17/2019 0346   PROT 7.9 03/15/2019 1204   ALBUMIN 2.5 (L) 03/15/2019 1204   AST 16 03/15/2019 1204   ALT 10 03/15/2019 1204   ALKPHOS 85 03/15/2019 1204   BILITOT 0.5 03/15/2019 1204  GFRNONAA 15 (L) 03/17/2019 0346   GFRAA 18 (L) 03/17/2019 0346   Lipase     Component Value Date/Time   LIPASE 51 03/15/2019 1204    Studies/Results: Ct Abdomen Pelvis Wo Contrast  Result Date: 03/15/2019 CLINICAL DATA:  Emesis, right lower quadrant pain, history of colon carcinoma EXAM: CT ABDOMEN AND PELVIS WITHOUT CONTRAST TECHNIQUE: Multidetector CT imaging of the abdomen and pelvis was performed following the standard protocol without IV contrast. COMPARISON:  02/09/2018 and previous FINDINGS: Lower chest: Linear scarring or subsegmental atelectasis posteriorly in the lower lobes as before. No pleural or pericardial effusion. Hepatobiliary: No focal liver  abnormality is seen. Status post cholecystectomy. No biliary dilatation. Pancreas: Unremarkable. No pancreatic ductal dilatation or surrounding inflammatory changes. Spleen: Small with coarse calcifications as before. Adrenals/Urinary Tract: Adrenal glands are unremarkable. Kidneys are normal, without renal calculi, focal lesion, or hydronephrosis. Bladder is unremarkable. Stomach/Bowel: Stomach decompressed. Small bowel decompressed. Marked wall thickening involving the terminal ileum and cecum without obstruction. Mild adjacent inflammatory/edematous changes. Regional adenopathy measured up to 3.2 cm , some nodes containing calcifications. The remainder of the colon is unremarkable. Vascular/Lymphatic: Right lower quadrant and central mesenteric adenopathy as above. No retroperitoneal or pelvic adenopathy. No significant vascular pathology identified. Reproductive: Uterus and bilateral adnexa are unremarkable. Other: Bilateral pelvic phleboliths.  No ascites.  No free air. Musculoskeletal: Extensive mottled lytic/sclerotic appearance of all visualized bones, probably related to sickle cell disease, present on studies dating back to 11/04/2016. No fracture. IMPRESSION: 1. Marked progression of wall thickening in the cecum, now also involving terminal ileum, with progressive regional adenopathy, suggesting progression of colon carcinoma. Cannot exclude superimposed typhlitis given the clinical presentation and regional inflammatory/edematous change. 2. Chronic changes of sickle cell disease in visualized bones and spleen. Electronically Signed   By: Lucrezia Europe M.D.   On: 03/15/2019 13:59     Kalman Drape, [facility] Pager [phone] Cristine Polio, & Friday 7:00am - 4:30pm Thursdays 7:00am -11:30am

## 2019-03-17 NOTE — Anesthesia Preprocedure Evaluation (Addendum)
Anesthesia Evaluation  Patient identified by MRN, date of birth, ID band Patient awake    Reviewed: Allergy & Precautions, H&P , NPO status , Patient's Chart, lab work & pertinent test results, reviewed documented beta blocker date and time   Airway Mallampati: II  TM Distance: >3 FB Neck ROM: Full    Dental no notable dental hx. (+) Edentulous Upper, Partial Lower, Dental Advisory Given   Pulmonary neg pulmonary ROS, former smoker,    Pulmonary exam normal breath sounds clear to auscultation       Cardiovascular Exercise Tolerance: Good hypertension, Pt. on medications and Pt. on home beta blockers +CHF   Rhythm:Regular Rate:Normal     Neuro/Psych negative neurological ROS  negative psych ROS   GI/Hepatic Neg liver ROS, GERD  Medicated and Controlled,  Endo/Other  diabetes, Insulin Dependent  Renal/GU Renal disease  negative genitourinary   Musculoskeletal  (+) Arthritis , Osteoarthritis,    Abdominal   Peds  Hematology  (+) Blood dyscrasia, Sickle cell anemia and anemia ,   Anesthesia Other Findings   Reproductive/Obstetrics negative OB ROS                            Anesthesia Physical Anesthesia Plan  ASA: III  Anesthesia Plan: MAC   Post-op Pain Management:    Induction: Intravenous  PONV Risk Score and Plan: 1 and Propofol infusion  Airway Management Planned: Simple Face Mask  Additional Equipment:   Intra-op Plan:   Post-operative Plan:   Informed Consent: I have reviewed the patients History and Physical, chart, labs and discussed the procedure including the risks, benefits and alternatives for the proposed anesthesia with the patient or authorized representative who has indicated his/her understanding and acceptance.     Dental advisory given  Plan Discussed with: CRNA  Anesthesia Plan Comments:         Anesthesia Quick Evaluation

## 2019-03-17 NOTE — Progress Notes (Signed)
Attending MD updated about the patient, MD stated that if IV team can insert IV, he still need to get 1 unit of PRBC. Order noted.

## 2019-03-17 NOTE — Progress Notes (Signed)
Patient ID: [name], male   DOB: [birth year], 68 y.o.   MRN: [id number] S: No new complaints.  Still with anorexia.  He did not have colonoscopy performed today due to lack of IV access. O:BP 108/73 (BP Location: Left Arm)   Pulse 96   Temp 99.2 F (37.3 C) (Oral)   Resp 20   SpO2 95%   Intake/Output Summary (Last 24 hours) at 03/17/2019 1646 Last data filed at 03/17/2019 1500 Gross per 24 hour  Intake 526 ml  Output 130 ml  Net 396 ml   Intake/Output: I/O last 3 completed shifts: In: 576 [P.O.:100; Blood:476] Out: 930 [Urine:930]  Intake/Output this shift:  Total I/O In: 50 [IV Piggyback:50] Out: -  Weight change:  Gen: NAD CVS: no rub Resp: occ rhonchi Abd: +BS, soft, NT/ND Ext: no edema  Recent Labs  Lab 03/12/19 0759 03/15/19 1204 03/16/19 0459 03/17/19 0346  NA 131* 133* 136 138  K 4.8 5.1 5.0 4.4  CL 99 101 103 107  CO2 17* 21* 21* 18*  GLUCOSE 160* 160* 189* 175*  BUN 68* 56* 55* 58*  CREATININE 4.26* 3.24* 3.38* 3.76*  ALBUMIN 2.5* 2.5*  --   --   CALCIUM 9.5 9.3 9.2 9.2  AST 13* 16  --   --   ALT 10 10  --   --    Liver Function Tests: Recent Labs  Lab 03/12/19 0759 03/15/19 1204  AST 13* 16  ALT 10 10  ALKPHOS 100 85  BILITOT 0.8 0.5  PROT 8.8* 7.9  ALBUMIN 2.5* 2.5*   Recent Labs  Lab 03/15/19 1204  LIPASE 51   No results for input(s): AMMONIA in the last 168 hours. CBC: Recent Labs  Lab 03/12/19 0758 03/15/19 1154 03/16/19 0459 03/17/19 0346  WBC 34.2* 39.5* 43.2* 33.4*  HGB 6.6* 7.0* 6.5* 6.5*  HCT 20.4* 22.6* 19.8* 20.2*  MCV 69.2* 70.4* 67.8* 72.4*  PLT 782* 981* 746* 593*   Cardiac Enzymes: No results for input(s): CKTOTAL, CKMB, CKMBINDEX, TROPONINI in the last 168 hours. CBG: Recent Labs  Lab 03/15/19 2220 03/16/19 0726 03/16/19 1141 03/16/19 1644 03/17/19 0842  GLUCAP 193* 174* 108* 226* 141*    Iron Studies: No results for input(s): IRON, TIBC, TRANSFERRIN, FERRITIN in the last 72  hours. Studies/Results: No results found. . sodium chloride   Intravenous Once  . sodium chloride   Intravenous Once  . sodium chloride   Intravenous Once  . amLODipine  5 mg Oral Daily  . atorvastatin  20 mg Oral Daily  . carvedilol  25 mg Oral BID WC  . docusate sodium  100 mg Oral BID  . folic acid  1 mg Oral Daily  . gabapentin  300 mg Oral TID  . insulin aspart  0-9 Units Subcutaneous TID WC  . metoCLOPramide (REGLAN) injection  10 mg Intravenous Once  . pantoprazole  40 mg Oral Daily    BMET    Component Value Date/Time   NA 138 03/17/2019 0346   K 4.4 03/17/2019 0346   CL 107 03/17/2019 0346   CO2 18 (L) 03/17/2019 0346   GLUCOSE 175 (H) 03/17/2019 0346   BUN 58 (H) 03/17/2019 0346   CREATININE 3.76 (H) 03/17/2019 0346   CALCIUM 9.2 03/17/2019 0346   GFRNONAA 15 (L) 03/17/2019 0346   GFRAA 18 (L) 03/17/2019 0346   CBC    Component Value Date/Time   WBC 33.4 (H) 03/17/2019 0346   RBC 2.79 (L) 03/17/2019 8891  HGB 6.5 (LL) 03/17/2019 0346   HCT 20.2 (L) 03/17/2019 0346   HCT 14.3 (LL) 11/07/2016 1518   PLT 593 (H) 03/17/2019 0346   MCV 72.4 (L) 03/17/2019 0346   MCH 23.3 (L) 03/17/2019 0346   MCHC 32.2 03/17/2019 0346   RDW 27.7 (H) 03/17/2019 0346   LYMPHSABS 1.9 01/16/2019 1820   MONOABS 1.0 01/16/2019 1820   EOSABS 1.0 (H) 01/16/2019 1820   BASOSABS 0.2 (H) 01/16/2019 1820     Assessment/Plan:  1. 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  He had an avg placed 2 years ago but it has clotted and he has been referred for new access, however he declined surgery.  Now with recurrent colon cancer.  He still is considering dialysis if/when he needs it.  Will ask [facility] to see him this hospitalization for access placement.  No indication for HD at this time and is at his baseline. 2. Recurrent colon cancer- for colonoscopy tomorrow after he gets central line access. 3. Anemia- SCA and CKD.  Transfuse prn.  No ESA due to progressive  cancer. 4. Chronic diastolic chf- stable 5. DM- per primary svc 6. Cocaine abuse- UDS negative this admission.  7. Disposition- pt currently DNR and will discuss possible transition to hospice pending colon cancer workup.  Donetta Potts, MD [employer] [phone]

## 2019-03-17 NOTE — Procedures (Signed)
Interventional Radiology Procedure Note  Procedure: Placement of a right IJ approach triple lumen CV catheter. Tip is positioned at the superior cavoatrial junction and catheter is ready for immediate use.  Complications: None Recommendations:  - Ok to use - Do not submerge - Routine line care   Signed,  Dulcy Fanny. Earleen Newport, DO

## 2019-03-17 NOTE — Progress Notes (Signed)
Received patient from Endo, per endo nurse, anesthesia tried to start IV but unable to get one. MD made aware. Nephrology, Dr. Marval Regal gave the clearance that we can stick the left arm .

## 2019-03-17 NOTE — Plan of Care (Signed)
  Problem: Education: Goal: Knowledge of General Education information will improve Description Including pain rating scale, medication(s)/side effects and non-pharmacologic comfort measures Outcome: Progressing   

## 2019-03-17 NOTE — Anesthesia Preprocedure Evaluation (Deleted)
Anesthesia Evaluation  Patient identified by MRN, date of birth, ID band Patient awake    Reviewed: Allergy & Precautions, NPO status , Patient's Chart, lab work & pertinent test results  Airway Mallampati: II  TM Distance: >3 FB Neck ROM: Full    Dental no notable dental hx.    Pulmonary neg pulmonary ROS, former smoker,    Pulmonary exam normal breath sounds clear to auscultation       Cardiovascular hypertension, +CHF  Normal cardiovascular exam Rhythm:Regular Rate:Normal     Neuro/Psych negative neurological ROS  negative psych ROS   GI/Hepatic negative GI ROS, Neg liver ROS,   Endo/Other  diabetes  Renal/GU Renal InsufficiencyRenal disease  negative genitourinary   Musculoskeletal negative musculoskeletal ROS (+)   Abdominal   Peds negative pediatric ROS (+)  Hematology  (+) Blood dyscrasia, anemia , SS   Anesthesia Other Findings   Reproductive/Obstetrics negative OB ROS                             Anesthesia Physical Anesthesia Plan  ASA: IV  Anesthesia Plan: MAC   Post-op Pain Management:    Induction: Intravenous  PONV Risk Score and Plan: 0  Airway Management Planned: Simple Face Mask  Additional Equipment:   Intra-op Plan:   Post-operative Plan:   Informed Consent: I have reviewed the patients History and Physical, chart, labs and discussed the procedure including the risks, benefits and alternatives for the proposed anesthesia with the patient or authorized representative who has indicated his/her understanding and acceptance.     Dental advisory given  Plan Discussed with: CRNA and Surgeon  Anesthesia Plan Comments:         Anesthesia Quick Evaluation

## 2019-03-17 NOTE — Progress Notes (Signed)
IV site not functioning well, IV team made aware to check patient STAT because we are infusing blood at this time. IV team unable to get another site. Endo taking the patient with blood for colonoscopy. Talked with Arrie Aran, RN.

## 2019-03-17 NOTE — Progress Notes (Signed)
Established IV R upper arm not flushing/running and was removed. Pt blood products were turned off upon arrival to endo unit. Anesthesia attempted multiple times to start peripheral IV. Procedure to be cancelled and rescheduled for tomorrow per Dr Carlean Purl. Primary nurse aware pt needs central line.

## 2019-03-18 ENCOUNTER — Inpatient Hospital Stay (HOSPITAL_COMMUNITY): Payer: Medicare HMO | Admitting: Anesthesiology

## 2019-03-18 ENCOUNTER — Encounter (HOSPITAL_COMMUNITY): Payer: Self-pay | Admitting: Interventional Radiology

## 2019-03-18 ENCOUNTER — Encounter: Payer: Self-pay | Admitting: *Deleted

## 2019-03-18 ENCOUNTER — Encounter (HOSPITAL_COMMUNITY)
Admission: EM | Disposition: A | Discharge status: Home / Self Care | Payer: Self-pay | Source: Home / Self Care | Attending: Family Medicine

## 2019-03-18 DIAGNOSIS — C180 Malignant neoplasm of cecum: Secondary | ICD-10-CM | POA: Diagnosis not present

## 2019-03-18 DIAGNOSIS — N184 Chronic kidney disease, stage 4 (severe): Secondary | ICD-10-CM | POA: Diagnosis not present

## 2019-03-18 DIAGNOSIS — C182 Malignant neoplasm of ascending colon: Secondary | ICD-10-CM

## 2019-03-18 HISTORY — PX: COLONOSCOPY WITH PROPOFOL: SHX5780

## 2019-03-18 HISTORY — PX: BIOPSY: SHX5522

## 2019-03-18 LAB — CBC
HCT: 24.6 % — ABNORMAL LOW (ref 39.0–52.0)
Hemoglobin: 8.1 g/dL — ABNORMAL LOW (ref 13.0–17.0)
MCH: 25.2 pg — ABNORMAL LOW (ref 26.0–34.0)
MCHC: 32.9 g/dL (ref 30.0–36.0)
MCV: 76.4 fL — ABNORMAL LOW (ref 80.0–100.0)
Platelets: 623 10*3/uL — ABNORMAL HIGH (ref 150–400)
RBC: 3.22 MIL/uL — ABNORMAL LOW (ref 4.22–5.81)
RDW: 26.1 % — ABNORMAL HIGH (ref 11.5–15.5)
WBC: 31.2 10*3/uL — ABNORMAL HIGH (ref 4.0–10.5)
nRBC: 0.2 % (ref 0.0–0.2)

## 2019-03-18 LAB — BPAM RBC
Blood Product Expiration Date: 202010212359
Blood Product Expiration Date: 202011072359
Blood Product Expiration Date: 202011142359
ISSUE DATE / TIME: 202010111447
ISSUE DATE / TIME: 202010121002
ISSUE DATE / TIME: 202010122233
Unit Type and Rh: 6200
Unit Type and Rh: 6200
Unit Type and Rh: 8400

## 2019-03-18 LAB — TYPE AND SCREEN
ABO/RH(D): AB POS
Antibody Screen: NEGATIVE
Unit division: 0
Unit division: 0
Unit division: 0

## 2019-03-18 LAB — RENAL FUNCTION PANEL
Albumin: 2 g/dL — ABNORMAL LOW (ref 3.5–5.0)
Anion gap: 15 (ref 5–15)
BUN: 50 mg/dL — ABNORMAL HIGH (ref 8–23)
CO2: 20 mmol/L — ABNORMAL LOW (ref 22–32)
Calcium: 9.2 mg/dL (ref 8.9–10.3)
Chloride: 105 mmol/L (ref 98–111)
Creatinine, Ser: 3.56 mg/dL — ABNORMAL HIGH (ref 0.61–1.24)
GFR calc Af Amer: 19 mL/min — ABNORMAL LOW (ref >60)
GFR calc non Af Amer: 16 mL/min — ABNORMAL LOW (ref >60)
Glucose, Bld: 165 mg/dL — ABNORMAL HIGH (ref 70–99)
Phosphorus: 4 mg/dL (ref 2.5–4.6)
Potassium: 4 mmol/L (ref 3.5–5.1)
Sodium: 140 mmol/L (ref 135–145)

## 2019-03-18 LAB — GLUCOSE, CAPILLARY
Glucose-Capillary: 164 mg/dL — ABNORMAL HIGH (ref 70–99)
Glucose-Capillary: 198 mg/dL — ABNORMAL HIGH (ref 70–99)
Glucose-Capillary: 195 mg/dL — ABNORMAL HIGH (ref 70–99)
Glucose-Capillary: 166 mg/dL — ABNORMAL HIGH (ref 70–99)

## 2019-03-18 SURGERY — COLONOSCOPY WITH PROPOFOL
Anesthesia: Monitor Anesthesia Care

## 2019-03-18 MED ORDER — PROPOFOL 500 MG/50ML IV EMUL
INTRAVENOUS | Status: DC | PRN
  Administered 2019-03-18: 100 ug/kg/min via INTRAVENOUS

## 2019-03-18 MED ORDER — SODIUM CHLORIDE 0.9% FLUSH: 10.0000 mL | INTRAVENOUS | Status: DC | PRN

## 2019-03-18 MED ORDER — PROPOFOL 10 MG/ML IV BOLUS
INTRAVENOUS | Status: DC | PRN
  Administered 2019-03-18: 20 mg via INTRAVENOUS
  Administered 2019-03-18: 30 mg via INTRAVENOUS

## 2019-03-18 MED ORDER — SODIUM CHLORIDE 0.9 % IV SOLN
INTRAVENOUS | Status: AC | PRN
  Administered 2019-03-18: 500 mL via INTRAVENOUS

## 2019-03-18 MED ORDER — SODIUM CHLORIDE 0.9% FLUSH
10.0000 mL | Freq: Two times a day (BID) | INTRAVENOUS | Status: DC
  Administered 2019-03-18 – 2019-03-30 (×8): 10 mL

## 2019-03-18 MED ORDER — CHLORHEXIDINE GLUCONATE CLOTH 2 % EX PADS
6.0000 | MEDICATED_PAD | Freq: Every day | CUTANEOUS | Status: DC
  Administered 2019-03-18 – 2019-04-02 (×13): 6 via TOPICAL

## 2019-03-18 SURGICAL SUPPLY — 22 items

## 2019-03-18 NOTE — Op Note (Signed)
[facility] Patient Name: [name] Procedure Date : 03/18/2019 MRN: [id number] Attending MD: Gatha Mayer , MD Date of Birth: 1950/09/07 CSN: [id number] Age: 68 Admit Type: Inpatient Procedure:                Colonoscopy Indications:              Abnormal CT of the GI tract Providers:                Gatha Mayer, MD, Jeanella Cara, RN,                            Lina Sar, Technician, Wyatt Haste Referring MD:              Medicines:                Propofol per Anesthesia, Monitored Anesthesia Care Complications:            No immediate complications. Estimated Blood Loss:     Estimated blood loss was minimal. Procedure:                Pre-Anesthesia Assessment:                           - Prior to the procedure, a History and Physical                            was performed, and patient medications and                            allergies were reviewed. The patient's tolerance of                            previous anesthesia was also reviewed. The risks                            and benefits of the procedure and the sedation                            options and risks were discussed with the patient.                            All questions were answered, and informed consent                            was obtained. Prior Anticoagulants: The patient has                            taken no previous anticoagulant or antiplatelet                            agents. ASA Grade Assessment: IV - A patient with                            severe systemic disease that is a constant threat  to life. After reviewing the risks and benefits,                            the patient was deemed in satisfactory condition to                            undergo the procedure.                           After obtaining informed consent, the colonoscope                            was passed under direct vision. Throughout the               procedure, the patient's blood pressure, pulse, and                            oxygen saturations were monitored continuously. The                            CF-HQ190L ([id number]) Olympus colonoscope was                            introduced through the anus and advanced to the the                            cecum, identified by its appearance. The                            colonoscopy was somewhat difficult due to                            significant looping. Successful completion of the                            procedure was aided by applying abdominal pressure.                            The patient tolerated the procedure well. The                            quality of the bowel preparation was fair. The                            rectum and tumor in cecum were photographed. The                            bowel preparation used was MoviPrep via split dose                            instruction. Scope In: 8:51:01 AM Scope Out: 9:01:37 AM Total Procedure Duration: 0 hours 10 minutes 36 seconds  Findings:      The perianal and digital rectal examinations were normal.      A fungating,  infiltrative and ulcerated non-obstructing large mass was       found in the ascending colon and in the cecum. The mass was       circumferential. No bleeding was present. This was biopsied with a cold       forceps for histology. Verification of patient identification for the       specimen was done. Estimated blood loss was minimal.      The exam was otherwise without abnormality on direct and retroflexion       views. Impression:               - Preparation of the colon was fair.                           - Malignant tumor in the ascending colon and in the                            cecum. Biopsied.                           - The examination was otherwise normal on direct                            and retroflexion views. Recommendation:           - Return patient to hospital ward for  ongoing care.                           - Clear liquid diet.                           -                           AWAIT PATHOLOGY - WILL F/U ON THAT                           WILL NOT ROUND UNLESS CALLED BACK AS MY ROLE                            COMPLETE I BELIEVE                           HE IS A POOR SURGICAL CANDIDATE AND A GOALS OF CARE                            CONVERSATION IS APPROPRIATE IF NOT DONE, HOWEVER HE                            WILL BLEED SOME (SLOWLY SO TRANSFUSION MAY KEEP UP)                            FROM THIS LESION WHICH COULD MAKE SURGERY MORE                            PLAUSIBLE BUT OVERALL I DO NOT THINK HE WILL DO  WELL WITH SURGERY UNLESS HE CAN BE "TUNED UP" AND                            EVEN THEN APPEARS TO BE LONG ODDS                           I CALLED EX-WIFE AND REVIEWED WITH HER STATUS -                            SUSPECT CANCER - WAITING ON BXS AND WILL NEED TO                            DETERMINE WHAT IS BEST AS FAR AS CARE PER [facility],                            ONCOLOGY, TRH AND OTHERS Procedure Code(s):        --- Professional ---                           [phone], Colonoscopy, flexible; with biopsy, single                            or multiple Diagnosis Code(s):        --- Professional ---                           C18.2, Malignant neoplasm of ascending colon                           C18.0, Malignant neoplasm of cecum                           R93.3, Abnormal findings on diagnostic imaging of                            other parts of digestive tract CPT copyright 2019 [employer]. All rights reserved. The codes documented in this report are preliminary and upon coder review may  be revised to meet current compliance requirements. Gatha Mayer, MD 03/18/2019 9:19:54 AM This report has been signed electronically. Number of Addenda: 0

## 2019-03-18 NOTE — Progress Notes (Signed)
Patient ID: [name], male   DOB: [birth year], 68 y.o.   MRN: [id number] S: He tolerated colonoscopy well but understands that it is likely recurrent colon cancer.  He is not sure what he wants to do about surgery and/or dialysis.  Will be talking with his family.  In good spirits despite the news O:BP 129/65 (BP Location: Right Arm)   Pulse 67   Temp 98.5 F (36.9 C) (Oral)   Resp 16   Ht 6\' 1"  (1.854 m)   Wt 64 kg   SpO2 100%   BMI 18.62 kg/m   Intake/Output Summary (Last 24 hours) at 03/18/2019 1428 Last data filed at 03/18/2019 0907 Gross per 24 hour  Intake 850 ml  Output 150 ml  Net 700 ml   Intake/Output: I/O last 3 completed shifts: In: 800 [P.O.:600; Blood:150; IV Piggyback:50] Out: 300 [Urine:300]  Intake/Output this shift:  Total I/O In: 200 [I.V.:200] Out: -  Weight change:  Gen: NAD CVS: no rub Resp: cta Abd: +BS, soft Ext: no edema  Recent Labs  Lab 03/12/19 0759 03/15/19 1204 03/16/19 0459 03/17/19 0346 03/18/19 0418  NA 131* 133* 136 138 140  K 4.8 5.1 5.0 4.4 4.0  CL 99 101 103 107 105  CO2 17* 21* 21* 18* 20*  GLUCOSE 160* 160* 189* 175* 165*  BUN 68* 56* 55* 58* 50*  CREATININE 4.26* 3.24* 3.38* 3.76* 3.56*  ALBUMIN 2.5* 2.5*  --   --  2.0*  CALCIUM 9.5 9.3 9.2 9.2 9.2  PHOS  --   --   --   --  4.0  AST 13* 16  --   --   --   ALT 10 10  --   --   --    Liver Function Tests: Recent Labs  Lab 03/12/19 0759 03/15/19 1204 03/18/19 0418  AST 13* 16  --   ALT 10 10  --   ALKPHOS 100 85  --   BILITOT 0.8 0.5  --   PROT 8.8* 7.9  --   ALBUMIN 2.5* 2.5* 2.0*   Recent Labs  Lab 03/15/19 1204  LIPASE 51   No results for input(s): AMMONIA in the last 168 hours. CBC: Recent Labs  Lab 03/12/19 0758 03/15/19 1154 03/16/19 0459 03/17/19 0346 03/18/19 0418  WBC 34.2* 39.5* 43.2* 33.4* 31.2*  HGB 6.6* 7.0* 6.5* 6.5* 8.1*  HCT 20.4* 22.6* 19.8* 20.2* 24.6*  MCV 69.2* 70.4* 67.8* 72.4* 76.4*  PLT 782* 981* 746* 593* 623*    Cardiac Enzymes: No results for input(s): CKTOTAL, CKMB, CKMBINDEX, TROPONINI in the last 168 hours. CBG: Recent Labs  Lab 03/17/19 0842 03/17/19 1754 03/17/19 2148 03/18/19 0749 03/18/19 1145  GLUCAP 141* 148* 246* 164* 198*    Iron Studies: No results for input(s): IRON, TIBC, TRANSFERRIN, FERRITIN in the last 72 hours. Studies/Results: Ir Fluoro Guide Cv Line Right  Result Date: 03/18/2019 INDICATION: 68 year old male referred for central line placement EXAM: IMAGE GUIDED PLACEMENT OF CENTRAL VENOUS CATHETER MEDICATIONS: NONE ANESTHESIA/SEDATION: NONE FLUOROSCOPY TIME:  Fluoroscopy Time: 0 minutes 12 seconds (1 mGy). COMPLICATIONS: NONE PROCEDURE: Informed written consent was obtained from the patient after a thorough discussion of the procedural risks, benefits and alternatives. All questions were addressed. Maximal Sterile Barrier Technique was utilized including caps, mask, sterile gowns, sterile gloves, sterile drape, hand hygiene and skin antiseptic. A timeout was performed prior to the initiation of the procedure. Patient was positioned supine position on fluoroscopy table. The right neck was prepped  and draped in the usual sterile fashion. 1% lidocaine was used for local anesthesia. Using ultrasound guidance, micropuncture access was performed at the right IJ. Once we confirmed wire position, we measured and internal length to 2 vertebral bodies below the carina. The catheter was modified on the back table and then placed through the peel-away sheath. Final image was stored. Aspiration was confirmed at the catheter site. Catheter was sutured in position and a sterile bandage was placed. Patient tolerated the procedure well and remained hemodynamically stable throughout. No complications were encountered and no significant blood loss. IMPRESSION: Status post image guided placement of right IJ central venous catheter. Signed, Dulcy Fanny. Dellia Nims, RPVI [facility] Radiology Electronically Signed   By: Corrie Mckusick D.O.   On: 03/18/2019 08:28   Ir US Guide Vasc Access Right  Result Date: 03/18/2019 INDICATION: 68 year old male referred for central line placement EXAM: IMAGE GUIDED PLACEMENT OF CENTRAL VENOUS CATHETER MEDICATIONS: NONE ANESTHESIA/SEDATION: NONE FLUOROSCOPY TIME:  Fluoroscopy Time: 0 minutes 12 seconds (1 mGy). COMPLICATIONS: NONE PROCEDURE: Informed written consent was obtained from the patient after a thorough discussion of the procedural risks, benefits and alternatives. All questions were addressed. Maximal Sterile Barrier Technique was utilized including caps, mask, sterile gowns, sterile gloves, sterile drape, hand hygiene and skin antiseptic. A timeout was performed prior to the initiation of the procedure. Patient was positioned supine position on fluoroscopy table. The right neck was prepped and draped in the usual sterile fashion. 1% lidocaine was used for local anesthesia. Using ultrasound guidance, micropuncture access was performed at the right IJ. Once we confirmed wire position, we measured and internal length to 2 vertebral bodies below the carina. The catheter was modified on the back table and then placed through the peel-away sheath. Final image was stored. Aspiration was confirmed at the catheter site. Catheter was sutured in position and a sterile bandage was placed. Patient tolerated the procedure well and remained hemodynamically stable throughout. No complications were encountered and no significant blood loss. IMPRESSION: Status post image guided placement of right IJ central venous catheter. Signed, Dulcy Fanny. Dellia Nims, RPVI [facility] Radiology Electronically Signed   By: Corrie Mckusick D.O.   On: 03/18/2019 08:28   . sodium chloride   Intravenous Once  . sodium chloride   Intravenous Once  . sodium chloride   Intravenous Once  . amLODipine  5  mg Oral Daily  . atorvastatin  20 mg Oral Daily  . carvedilol  25 mg Oral BID WC  . Chlorhexidine Gluconate Cloth  6 each Topical Daily  . docusate sodium  100 mg Oral BID  . feeding supplement  1 Container Oral TID BM  . folic acid  1 mg Oral Daily  . gabapentin  300 mg Oral TID  . insulin aspart  0-9 Units Subcutaneous TID WC  . metoCLOPramide (REGLAN) injection  10 mg Intravenous Once  . pantoprazole  40 mg Oral Daily  . sodium chloride flush  10-40 mL Intracatheter Q12H    BMET    Component Value Date/Time   NA 140 03/18/2019 0418   K 4.0 03/18/2019 0418   CL 105 03/18/2019 0418   CO2 20 (L) 03/18/2019 0418   GLUCOSE 165 (H) 03/18/2019 0418   BUN 50 (H) 03/18/2019 0418   CREATININE 3.56 (H) 03/18/2019 0418   CALCIUM 9.2 03/18/2019 0418   GFRNONAA 16 (L) 03/18/2019 0418   GFRAA 19 (L) 03/18/2019 3382  CBC    Component Value Date/Time   WBC 31.2 (H) 03/18/2019 0418   RBC 3.22 (L) 03/18/2019 0418   HGB 8.1 (L) 03/18/2019 0418   HCT 24.6 (L) 03/18/2019 0418   HCT 14.3 (LL) 11/07/2016 1518   PLT 623 (H) 03/18/2019 0418   MCV 76.4 (L) 03/18/2019 0418   MCH 25.2 (L) 03/18/2019 0418   MCHC 32.9 03/18/2019 0418   RDW 26.1 (H) 03/18/2019 0418   LYMPHSABS 1.9 01/16/2019 1820   MONOABS 1.0 01/16/2019 1820   EOSABS 1.0 (H) 01/16/2019 1820   BASOSABS 0.2 (H) 01/16/2019 1820     Assessment/Plan:  1. 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  He had an avg placed 2 years ago but it has clotted and he has been referred for new access, however he declined surgery.  Now with recurrent colon cancer.  He still is considering dialysis if/when he needs it.   1. Have consulted [facility] for access placement which was supposed to happen months ago.   2. No indication for HD at this time and is at his baseline. 3. Need to have a goals of care meeting with he and his family especially if he is not a surgical candidate for his cancer. 2. Recurrent colon cancer- per  Dr. Carlean Purl, poor surgical cancer with grim prognosis.  Agree with palliative care consult. 3. Anemia- SCA and CKD.  Transfuse prn.  No ESA due to progressive cancer. 4. Chronic diastolic chf- stable 5. DM- per primary svc 6. Cocaine abuse- UDS negative this admission.  7. Disposition- pt currently DNR and will discuss possible transition to hospice pending colon cancer workup.  Donetta Potts, MD [employer] [phone]

## 2019-03-18 NOTE — H&P (View-Only) (Signed)
Hospital Consult  VASCULAR SURGERY ASSESSMENT & PLAN:   STAGE IV/V CHRONIC KIDNEY DISEASE: The patient is now agreeable to placement of AV access.  He was evaluated previously in January in our office but refused.  He has a clotted left forearm graft.  He is not a candidate for a fistula in the left arm.  I have recommended placement of a left upper arm AV graft.  This has been scheduled for Thursday with Dr. Donnetta Hutching.  I have discussed the indications for the procedure and the potential complications with the patient and he is agreeable to proceed.  Deitra Mayo, MD, [place] [phone] Office: [phone]   Reason for Consult:  Permanent HD access Requesting Physician:  Owens Shark MRN #:  [id number]  History of Present Illness: This is a 68 y.o. male who was seen in our office in January 2020 for dialysis access.  At that time, he was CKD 5.  He had a previous left FA loop graft by Dr. Trula Slade in June 2018 that did thrombose.  He did have a hx of left sided port a cath but had been removed.  He had weight loss earlier in the year and was to be evaluated for nodule on his larynx but he had hyperkalemia and it was not done.  When he was seen earlier in the year, he had 30lb weight loss.  He was admitted this week with more weight loss.  He has hx of colon cancer in the past and underwent colonoscopy today with findings of malignant ascending mass.  On 10/7, he underwent CO2 laser excision of left vocal fold lesion and this was densely inflamed squamous papilloma with no evidence of malignancy.     The pt states he is not yet on dialysis.  He tells me he does not want live with a colostomy.  He states he does not want to think about dialysis at this time until the cancer is sorted out.  There is no indication for dialysis at this time per Dr. Marval Regal.    Father died of cancer.  Mother died of brain aneurysm.  The pt is on a statin for cholesterol management.  The pt is not on a daily aspirin.    The pt is on CCB and BB for hypertension.   The pt is not diabetic.   Tobacco hx:  Remote.  He does have hx of crack cocaine use with last use in July 2020.  Past Medical History:  Diagnosis Date  . Arthritis   . Cancer ([place])    colon  . Cataracts, bilateral    surgery to remove  . Diabetes mellitus    type 2  . GERD (gastroesophageal reflux disease)   . Heart murmur   . History of kidney stones    passed stone, no surgery  . Hyperlipidemia   . Hypertension   . Lesion of vocal cord   . Pneumonia 2019   x 2  . Renal insufficiency    no dialysis - stagte 5  . Sickle cell anemia ([facility])    Hgb SS disease   . Wears glasses     Past Surgical History:  Procedure Laterality Date  . AV FISTULA PLACEMENT Left 11/22/2016   Procedure: INSERTION OF ARTERIOVENOUS (AV) GORE-TEX GRAFT  LEFT FOREARM USING 6MM X 50CM GORETEX GRAFT;  Surgeon: Serafina Mitchell, MD;  Location: [place];  Service: Vascular;  Laterality: Left;  . CATARACT EXTRACTION W/ INTRAOCULAR LENS  IMPLANT, BILATERAL    .  CHOLECYSTECTOMY    . CO2 LASER APPLICATION N/A 96/0/4540   Procedure: CO2 LASER EXCISION OF VOCAL FOLD LESION;  Surgeon: Melida Quitter, MD;  Location: [place];  Service: ENT;  Laterality: N/A;  . COLON SURGERY     Colon cancer surgery, removed small amt not full colectomy  . COLONOSCOPY     nclear who follows him for this  . DIRECT LARYNGOSCOPY N/A 03/12/2019   Procedure: SUSPENDED MICRO DIRECT LARYNGOSCOPY;  Surgeon: Melida Quitter, MD;  Location: [place];  Service: ENT;  Laterality: N/A;  . IR FLUORO GUIDE CV LINE RIGHT  03/17/2019  . IR US GUIDE VASC ACCESS RIGHT  03/17/2019  . LEG SURGERY     For ? gangrene after running into barbwire fence at 68yo  . MULTIPLE TOOTH EXTRACTIONS     no dentures    Allergies  Allergen Reactions  . Morphine And Related Itching    Prior to Admission medications   Medication Sig Start Date End Date Taking? Authorizing Provider  acetaminophen (TYLENOL) 500 MG tablet Take  500 mg by mouth every 6 (six) hours as needed for headache (pain).   Yes [provider]  amLODipine (NORVASC) 5 MG tablet Take 5 mg by mouth daily. 10/04/17  Yes [provider]  atorvastatin (LIPITOR) 20 MG tablet Take 20 mg by mouth daily.  02/01/18  Yes [provider]  calcitRIOL (ROCALTROL) 0.25 MCG capsule Take 0.25 mcg by mouth every other day. In the morning. 04/25/18  Yes [provider]  calcium acetate (PHOSLO) 667 MG capsule Take 667 mg by mouth 3 (three) times daily with meals.   Yes [provider]  carvedilol (COREG) 25 MG tablet Take 25 mg by mouth 2 (two) times daily. 09/04/17  Yes [provider]  ferrous sulfate (FEOSOL) 325 (65 FE) MG tablet Take 1 tablet (325 mg total) by mouth 2 (two) times daily with a meal. Patient taking differently: Take 325 mg by mouth 2 (two) times daily.  01/18/19  Yes Vasireddy, Grier Mitts, MD  folic acid (FOLVITE) 981 MCG tablet Take 800 mcg by mouth daily.   Yes [provider]  furosemide (LASIX) 40 MG tablet Take 40 mg by mouth See admin instructions. Morning & 1400    Yes [provider]  gabapentin (NEURONTIN) 300 MG capsule Take 1 capsule (300 mg total) by mouth at bedtime. Patient taking differently: Take 300 mg by mouth 3 (three) times daily.  01/18/19  Yes Vasireddy, Grier Mitts, MD  insulin aspart (NOVOLOG FLEXPEN) 100 UNIT/ML FlexPen Inject 2-6 Units into the skin 3 (three) times daily as needed for high blood sugar (CBG >150).   Yes [provider]  omeprazole (PRILOSEC) 20 MG capsule Take 20 mg by mouth daily.    Yes [provider]  ONGLYZA 5 MG TABS tablet Take 5 mg by mouth daily.  06/21/18  Yes [provider]  polyethylene glycol powder (MIRALAX) powder Take 17 g by mouth daily. Patient taking differently: Take 17 g by mouth daily as needed (constipation). Mix in 8 oz liquid and drink 10/28/16  Yes Palumbo, April, MD  VENTOLIN HFA 108 (90 Base) MCG/ACT  inhaler Inhale 2 puffs into the lungs every 6 (six) hours as needed for wheezing.  09/03/17  Yes [provider]    Social History   Socioeconomic History  . Marital status: Divorced    Spouse name: Not on file  . Number of children: Not on file  . Years of education: Not on  file  . Highest education level: Not on file  Occupational History  . Occupation: retired  Scientific laboratory technician  . Financial resource strain: Not on file  . Food insecurity    Worry: Not on file    Inability: Not on file  . Transportation needs    Medical: Not on file    Non-medical: Not on file  Tobacco Use  . Smoking status: Former Smoker    Packs/day: 0.50    Years: 20.00    Pack years: 10.00    Types: Cigarettes    Quit date: 06/05/1966    Years since quitting: 52.8  . Smokeless tobacco: Never Used  Substance and Sexual Activity  . Alcohol use: Yes    Comment: rare beer, last use maybe July  . Drug use: Not Currently    Types: Cocaine, "Crack" cocaine    Comment: Last use July 2020  . Sexual activity: Not on file  Lifestyle  . Physical activity    Days per week: Not on file    Minutes per session: Not on file  . Stress: Not on file  Relationships  . Social Herbalist on phone: Not on file    Gets together: Not on file    Attends religious service: Not on file    Active member of club or organization: Not on file    Attends meetings of clubs or organizations: Not on file    Relationship status: Not on file  . Intimate partner violence    Fear of current or ex partner: Not on file    Emotionally abused: Not on file    Physically abused: Not on file    Forced sexual activity: Not on file  Other Topics Concern  . Not on file  Social History Narrative   Retired Administrator, on disability. Former smoker. Living by himself, able to get around [facility].    Has a wife who checks on him but they don't live together sicne 2007   Quit drinking      Family History  Problem Relation Age of  Onset  . Venous thrombosis Mother        Dec. of blood clot   . Diabetes type II Mother   . AAA (abdominal aortic aneurysm) Mother   . Throat cancer Father        Deceased of throat cancer    ROS: [x]  Positive   [ ]  Negative   [ ]  All sytems reviewed and are negative  Cardiac: [x]  murmur  Vascular: []  pain in legs while walking []  hx of DVT []  swelling in legs  Pulmonary: []  productive cough []  asthma/wheezing []  home O2  Neurologic: []  hx of CVA []  mini stroke  Hematologic: [x]  hx of cancer-recurrent colon ca [x]  hx sickle cell anemia  Endocrine:   []  diabetes []  thyroid disease  GI [x]  GERD   GU: [x]  CKD/renal failure []  HD--[]  M/W/F or []  T/T/S  Psychiatric: []  anxiety []  depression  Musculoskeletal: [x]  arthritis [x]  joint pain-left ankle  Integumentary: []  rashes []  ulcers  Constitutional: []  fever []  chills   Physical Examination  Vitals:   03/18/19 0925 03/18/19 0954  BP: (!) 130/54 129/65  Pulse: 64 67  Resp: 14 16  Temp:  98.5 F (36.9 C)  SpO2: 97% 100%   Body mass index is 18.62 kg/m.  General:  WDWN in NAD Gait: Not observed HENT: WNL, normocephalic Pulmonary: normal non-labored breathing, without Rales, rhonchi,  wheezing Cardiac: regular,  with Murmur  Abdomen:  Tender to palpation left Skin: without rashes Vascular Exam/Pulses:  Right Left  Radial 2+ (normal) 2+ (normal)  Ulnar 1+ (weak) 1+ (weak)  DP 2+ (normal) 2+ (normal)  PT 2+ (normal) 2+ (normal)   Extremities: tenderness left medial ankle; no wounds present Musculoskeletal: no muscle wasting or atrophy  Neurologic: A&O X 3;  No focal weakness or paresthesias are detected; speech is fluent/normal Psychiatric:  The pt has Normal affect.  CBC    Component Value Date/Time   WBC 31.2 (H) 03/18/2019 0418   RBC 3.22 (L) 03/18/2019 0418   HGB 8.1 (L) 03/18/2019 0418   HCT 24.6 (L) 03/18/2019 0418   HCT 14.3 (LL) 11/07/2016 1518   PLT 623 (H) 03/18/2019 0418    MCV 76.4 (L) 03/18/2019 0418   MCH 25.2 (L) 03/18/2019 0418   MCHC 32.9 03/18/2019 0418   RDW 26.1 (H) 03/18/2019 0418   LYMPHSABS 1.9 01/16/2019 1820   MONOABS 1.0 01/16/2019 1820   EOSABS 1.0 (H) 01/16/2019 1820   BASOSABS 0.2 (H) 01/16/2019 1820    BMET    Component Value Date/Time   NA 140 03/18/2019 0418   K 4.0 03/18/2019 0418   CL 105 03/18/2019 0418   CO2 20 (L) 03/18/2019 0418   GLUCOSE 165 (H) 03/18/2019 0418   BUN 50 (H) 03/18/2019 0418   CREATININE 3.56 (H) 03/18/2019 0418   CALCIUM 9.2 03/18/2019 0418   GFRNONAA 16 (L) 03/18/2019 0418   GFRAA 19 (L) 03/18/2019 0418    COAGS: Lab Results  Component Value Date   INR 1.27 02/09/2018   INR 1.02 12/26/2016   INR 1.13 09/03/2014     Non-Invasive Vascular Imaging:   Upper Extremity Vein Mapping on 6/62/9476: Left Cephalic Diameter (cm)Depth (cm) Findings  +-----------------+-------------+----------+--------------+ Shoulder   not visualized +-----------------+-------------+----------+--------------+ Prox upper arm   not visualized +-----------------+-------------+----------+--------------+ Mid upper arm   not visualized +-----------------+-------------+----------+--------------+ Dist upper arm   not visualized +-----------------+-------------+----------+--------------+ Antecubital fossa  not visualized +-----------------+-------------+----------+--------------+  +-----------------+-------------+----------+--------+ Left Basilic Diameter (cm)Depth (cm)Findings +-----------------+-------------+----------+--------+ Mid upper arm  0.37    +-----------------+-------------+----------+--------+ Dist upper arm  0.30    +-----------------+-------------+----------+--------+ Antecubital  fossa 0.17    +-----------------+-------------+----------+--------+    ASSESSMENT/PLAN: This is a 68 y.o. male who is right hand dominant with CKD V now with recurrent colon cancer & hx of thrombosed left FA loop graft. Pt has hx of left sided port a cath but has been removed.    -pt was seen back in January of this year in our office.  It was discussed with Dr. Massie Maroon and the plan was for right upper arm graft, but this did not get done.  There is no indication for HD right now.  Pt tells me that he does not want to live with colostomy, so this will need to be sorted out prior to access placement.  Pt states his son will be up here later today to help him make decisions.     Leontine Locket, PA-C [facility] [phone]

## 2019-03-18 NOTE — Progress Notes (Signed)
Pharmacy Antibiotic Note  [name] is a 68 y.o. male on Day # 28 Zosyn for intra-abdominal coverage.WBC remains elevated but has trended down some.  CKD5. Crcl remains ~15-20 ml/min.  Plan:  Continue Zosyn 2.25 gm IV q8hrs.  Follow renal function, antibiotic plans.  Height: 6\' 1"  (185.4 cm) Weight: 141 lb 1.5 oz (64 kg) IBW/kg (Calculated) : 79.9  Temp (24hrs), Avg:98.9 F (37.2 C), Min:98.2 F (36.8 C), Max:99.9 F (37.7 C)  Recent Labs  Lab 03/12/19 0758 03/12/19 0759 03/15/19 1154 03/15/19 1204 03/16/19 0459 03/17/19 0346 03/18/19 0418  WBC 34.2*  --  39.5*  --  43.2* 33.4* 31.2*  CREATININE  --  4.26*  --  3.24* 3.38* 3.76* 3.56*    Estimated Creatinine Clearance: 17.5 mL/min (A) (by C-G formula based on SCr of 3.56 mg/dL (H)).    Allergies  Allergen Reactions  . Morphine And Related Itching    Antimicrobials this admission:  Zosyn 10/10>>  Dose adjustments this admission:  n/a  Microbiology results: 10/10 COVID: negative  Thank you for allowing pharmacy to be a part of this patient's care.  Arty Baumgartner, [place] Pager: [phone] or phone: [phone] 03/18/2019 3:11 PM

## 2019-03-18 NOTE — Consult Note (Addendum)
Hospital Consult  VASCULAR SURGERY ASSESSMENT & PLAN:   STAGE IV/V CHRONIC KIDNEY DISEASE: The patient is now agreeable to placement of AV access.  He was evaluated previously in January in our office but refused.  He has a clotted left forearm graft.  He is not a candidate for a fistula in the left arm.  I have recommended placement of a left upper arm AV graft.  This has been scheduled for Thursday with Dr. Donnetta Hutching.  I have discussed the indications for the procedure and the potential complications with the patient and he is agreeable to proceed.  Deitra Mayo, MD, [place] [phone] Office: [phone]   Reason for Consult:  Permanent HD access Requesting Physician:  Owens Shark MRN #:  [id number]  History of Present Illness: This is a 68 y.o. male who was seen in our office in January 2020 for dialysis access.  At that time, he was CKD 5.  He had a previous left FA loop graft by Dr. Trula Slade in June 2018 that did thrombose.  He did have a hx of left sided port a cath but had been removed.  He had weight loss earlier in the year and was to be evaluated for nodule on his larynx but he had hyperkalemia and it was not done.  When he was seen earlier in the year, he had 30lb weight loss.  He was admitted this week with more weight loss.  He has hx of colon cancer in the past and underwent colonoscopy today with findings of malignant ascending mass.  On 10/7, he underwent CO2 laser excision of left vocal fold lesion and this was densely inflamed squamous papilloma with no evidence of malignancy.     The pt states he is not yet on dialysis.  He tells me he does not want live with a colostomy.  He states he does not want to think about dialysis at this time until the cancer is sorted out.  There is no indication for dialysis at this time per Dr. Marval Regal.    Father died of cancer.  Mother died of brain aneurysm.  The pt is on a statin for cholesterol management.  The pt is not on a daily aspirin.    The pt is on CCB and BB for hypertension.   The pt is not diabetic.   Tobacco hx:  Remote.  He does have hx of crack cocaine use with last use in July 2020.  Past Medical History:  Diagnosis Date  . Arthritis   . Cancer ([place])    colon  . Cataracts, bilateral    surgery to remove  . Diabetes mellitus    type 2  . GERD (gastroesophageal reflux disease)   . Heart murmur   . History of kidney stones    passed stone, no surgery  . Hyperlipidemia   . Hypertension   . Lesion of vocal cord   . Pneumonia 2019   x 2  . Renal insufficiency    no dialysis - stagte 5  . Sickle cell anemia ([facility])    Hgb SS disease   . Wears glasses     Past Surgical History:  Procedure Laterality Date  . AV FISTULA PLACEMENT Left 11/22/2016   Procedure: INSERTION OF ARTERIOVENOUS (AV) GORE-TEX GRAFT  LEFT FOREARM USING 6MM X 50CM GORETEX GRAFT;  Surgeon: Serafina Mitchell, MD;  Location: [facility];  Service: Vascular;  Laterality: Left;  . CATARACT EXTRACTION W/ INTRAOCULAR LENS  IMPLANT, BILATERAL    .  CHOLECYSTECTOMY    . CO2 LASER APPLICATION N/A 73/09/1937   Procedure: CO2 LASER EXCISION OF VOCAL FOLD LESION;  Surgeon: Melida Quitter, MD;  Location: [facility];  Service: ENT;  Laterality: N/A;  . COLON SURGERY     Colon cancer surgery, removed small amt not full colectomy  . COLONOSCOPY     nclear who follows him for this  . DIRECT LARYNGOSCOPY N/A 03/12/2019   Procedure: SUSPENDED MICRO DIRECT LARYNGOSCOPY;  Surgeon: Melida Quitter, MD;  Location: [facility];  Service: ENT;  Laterality: N/A;  . IR FLUORO GUIDE CV LINE RIGHT  03/17/2019  . IR US GUIDE VASC ACCESS RIGHT  03/17/2019  . LEG SURGERY     For ? gangrene after running into barbwire fence at 68yo  . MULTIPLE TOOTH EXTRACTIONS     no dentures    Allergies  Allergen Reactions  . Morphine And Related Itching    Prior to Admission medications   Medication Sig Start Date End Date Taking? Authorizing Provider  acetaminophen (TYLENOL) 500 MG tablet Take  500 mg by mouth every 6 (six) hours as needed for headache (pain).   Yes [provider]  amLODipine (NORVASC) 5 MG tablet Take 5 mg by mouth daily. 10/04/17  Yes [provider]  atorvastatin (LIPITOR) 20 MG tablet Take 20 mg by mouth daily.  02/01/18  Yes [provider]  calcitRIOL (ROCALTROL) 0.25 MCG capsule Take 0.25 mcg by mouth every other day. In the morning. 04/25/18  Yes [provider]  calcium acetate (PHOSLO) 667 MG capsule Take 667 mg by mouth 3 (three) times daily with meals.   Yes [provider]  carvedilol (COREG) 25 MG tablet Take 25 mg by mouth 2 (two) times daily. 09/04/17  Yes [provider]  ferrous sulfate (FEOSOL) 325 (65 FE) MG tablet Take 1 tablet (325 mg total) by mouth 2 (two) times daily with a meal. Patient taking differently: Take 325 mg by mouth 2 (two) times daily.  01/18/19  Yes Vasireddy, Grier Mitts, MD  folic acid (FOLVITE) 902 MCG tablet Take 800 mcg by mouth daily.   Yes [provider]  furosemide (LASIX) 40 MG tablet Take 40 mg by mouth See admin instructions. Morning & 1400    Yes [provider]  gabapentin (NEURONTIN) 300 MG capsule Take 1 capsule (300 mg total) by mouth at bedtime. Patient taking differently: Take 300 mg by mouth 3 (three) times daily.  01/18/19  Yes Vasireddy, Grier Mitts, MD  insulin aspart (NOVOLOG FLEXPEN) 100 UNIT/ML FlexPen Inject 2-6 Units into the skin 3 (three) times daily as needed for high blood sugar (CBG >150).   Yes [provider]  omeprazole (PRILOSEC) 20 MG capsule Take 20 mg by mouth daily.    Yes [provider]  ONGLYZA 5 MG TABS tablet Take 5 mg by mouth daily.  06/21/18  Yes [provider]  polyethylene glycol powder (MIRALAX) powder Take 17 g by mouth daily. Patient taking differently: Take 17 g by mouth daily as needed (constipation). Mix in 8 oz liquid and drink 10/28/16  Yes Palumbo, April, MD  VENTOLIN HFA 108 (90 Base) MCG/ACT  inhaler Inhale 2 puffs into the lungs every 6 (six) hours as needed for wheezing.  09/03/17  Yes [provider]    Social History   Socioeconomic History  . Marital status: Divorced    Spouse name: Not on file  . Number of children: Not on file  . Years of education: Not on  file  . Highest education level: Not on file  Occupational History  . Occupation: retired  Scientific laboratory technician  . Financial resource strain: Not on file  . Food insecurity    Worry: Not on file    Inability: Not on file  . Transportation needs    Medical: Not on file    Non-medical: Not on file  Tobacco Use  . Smoking status: Former Smoker    Packs/day: 0.50    Years: 20.00    Pack years: 10.00    Types: Cigarettes    Quit date: 06/05/1966    Years since quitting: 52.8  . Smokeless tobacco: Never Used  Substance and Sexual Activity  . Alcohol use: Yes    Comment: rare beer, last use maybe July  . Drug use: Not Currently    Types: Cocaine, "Crack" cocaine    Comment: Last use July 2020  . Sexual activity: Not on file  Lifestyle  . Physical activity    Days per week: Not on file    Minutes per session: Not on file  . Stress: Not on file  Relationships  . Social Herbalist on phone: Not on file    Gets together: Not on file    Attends religious service: Not on file    Active member of club or organization: Not on file    Attends meetings of clubs or organizations: Not on file    Relationship status: Not on file  . Intimate partner violence    Fear of current or ex partner: Not on file    Emotionally abused: Not on file    Physically abused: Not on file    Forced sexual activity: Not on file  Other Topics Concern  . Not on file  Social History Narrative   Retired Administrator, on disability. Former smoker. Living by himself, able to get around [facility].    Has a wife who checks on him but they don't live together sicne 2007   Quit drinking      Family History  Problem Relation Age of  Onset  . Venous thrombosis Mother        Dec. of blood clot   . Diabetes type II Mother   . AAA (abdominal aortic aneurysm) Mother   . Throat cancer Father        Deceased of throat cancer    ROS: [x]  Positive   [ ]  Negative   [ ]  All sytems reviewed and are negative  Cardiac: [x]  murmur  Vascular: []  pain in legs while walking []  hx of DVT []  swelling in legs  Pulmonary: []  productive cough []  asthma/wheezing []  home O2  Neurologic: []  hx of CVA []  mini stroke  Hematologic: [x]  hx of cancer-recurrent colon ca [x]  hx sickle cell anemia  Endocrine:   []  diabetes []  thyroid disease  GI [x]  GERD   GU: [x]  CKD/renal failure []  HD--[]  M/W/F or []  T/T/S  Psychiatric: []  anxiety []  depression  Musculoskeletal: [x]  arthritis [x]  joint pain-left ankle  Integumentary: []  rashes []  ulcers  Constitutional: []  fever []  chills   Physical Examination  Vitals:   03/18/19 0925 03/18/19 0954  BP: (!) 130/54 129/65  Pulse: 64 67  Resp: 14 16  Temp:  98.5 F (36.9 C)  SpO2: 97% 100%   Body mass index is 18.62 kg/m.  General:  WDWN in NAD Gait: Not observed HENT: WNL, normocephalic Pulmonary: normal non-labored breathing, without Rales, rhonchi,  wheezing Cardiac: regular,  with Murmur  Abdomen:  Tender to palpation left Skin: without rashes Vascular Exam/Pulses:  Right Left  Radial 2+ (normal) 2+ (normal)  Ulnar 1+ (weak) 1+ (weak)  DP 2+ (normal) 2+ (normal)  PT 2+ (normal) 2+ (normal)   Extremities: tenderness left medial ankle; no wounds present Musculoskeletal: no muscle wasting or atrophy  Neurologic: A&O X 3;  No focal weakness or paresthesias are detected; speech is fluent/normal Psychiatric:  The pt has Normal affect.  CBC    Component Value Date/Time   WBC 31.2 (H) 03/18/2019 0418   RBC 3.22 (L) 03/18/2019 0418   HGB 8.1 (L) 03/18/2019 0418   HCT 24.6 (L) 03/18/2019 0418   HCT 14.3 (LL) 11/07/2016 1518   PLT 623 (H) 03/18/2019 0418    MCV 76.4 (L) 03/18/2019 0418   MCH 25.2 (L) 03/18/2019 0418   MCHC 32.9 03/18/2019 0418   RDW 26.1 (H) 03/18/2019 0418   LYMPHSABS 1.9 01/16/2019 1820   MONOABS 1.0 01/16/2019 1820   EOSABS 1.0 (H) 01/16/2019 1820   BASOSABS 0.2 (H) 01/16/2019 1820    BMET    Component Value Date/Time   NA 140 03/18/2019 0418   K 4.0 03/18/2019 0418   CL 105 03/18/2019 0418   CO2 20 (L) 03/18/2019 0418   GLUCOSE 165 (H) 03/18/2019 0418   BUN 50 (H) 03/18/2019 0418   CREATININE 3.56 (H) 03/18/2019 0418   CALCIUM 9.2 03/18/2019 0418   GFRNONAA 16 (L) 03/18/2019 0418   GFRAA 19 (L) 03/18/2019 0418    COAGS: Lab Results  Component Value Date   INR 1.27 02/09/2018   INR 1.02 12/26/2016   INR 1.13 09/03/2014     Non-Invasive Vascular Imaging:   Upper Extremity Vein Mapping on 6/96/7893: Left Cephalic Diameter (cm)Depth (cm) Findings  +-----------------+-------------+----------+--------------+ Shoulder   not visualized +-----------------+-------------+----------+--------------+ Prox upper arm   not visualized +-----------------+-------------+----------+--------------+ Mid upper arm   not visualized +-----------------+-------------+----------+--------------+ Dist upper arm   not visualized +-----------------+-------------+----------+--------------+ Antecubital fossa  not visualized +-----------------+-------------+----------+--------------+  +-----------------+-------------+----------+--------+ Left Basilic Diameter (cm)Depth (cm)Findings +-----------------+-------------+----------+--------+ Mid upper arm  0.37    +-----------------+-------------+----------+--------+ Dist upper arm  0.30    +-----------------+-------------+----------+--------+ Antecubital  fossa 0.17    +-----------------+-------------+----------+--------+    ASSESSMENT/PLAN: This is a 68 y.o. male who is right hand dominant with CKD V now with recurrent colon cancer & hx of thrombosed left FA loop graft. Pt has hx of left sided port a cath but has been removed.    -pt was seen back in January of this year in our office.  It was discussed with Dr. Massie Maroon and the plan was for right upper arm graft, but this did not get done.  There is no indication for HD right now.  Pt tells me that he does not want to live with colostomy, so this will need to be sorted out prior to access placement.  Pt states his son will be up here later today to help him make decisions.     Leontine Locket, PA-C [facility] [phone]

## 2019-03-18 NOTE — Progress Notes (Signed)
[facility]/Trauma Progress Note  Day of Surgery   Assessment/Plan  Hx of colon cancer s/p some type of colon resection in 1982 Hx of sickle cell anemia ESRD not on HD DM HTN HLD CHF Cocaine abuse Anemia -Hgb 6.5 this a.m. transfusion as needed per medicine DNR  RLQ abdominal pain and anemia - CT scan showed wall thickening in the cecum, now also involving terminal ileum, with progressive regional adenopathy, suggesting progression of colon carcinoma - colonoscopy this a.m. showed Malignant tumor in the ascending colon and in the cecum - CEA 82.7 - suggest palliative care consult to discuss goals of care - Patient would like to discuss surgical options with family before proceeding with any surgical intervention - We will follow  FEN: CLD VTE: SCD's, chemical prophylaxis per medicine ID: Zosyn 10/10>>   WBC 31.2, afebrile Foley: None Follow up: TBD   LOS: 3 days    Subjective: CC: RLQ abdominal pain  Patient still having significant RLQ abdominal pain. Discussed results of colonoscopy. I discussed surgical options with patient including partial colectomy/ileocecectomy to remove tumor and diverting ileostomy.  Patient is adamant that he would not like to live the rest of his days with an ostomy.  He states that he has many miles behind him but he feels as though he has some miles ahead of him.  He states his wife is also battling cancer.  He would like to discuss options with his son and wife before deciding on any type of surgical intervention.  Objective: Vital signs in last 24 hours: Temp:  [98.2 F (36.8 C)-99.9 F (37.7 C)] 98.2 F (36.8 C) (10/13 0907) Pulse Rate:  [62-96] 64 (10/13 0925) Resp:  [14-20] 14 (10/13 0925) BP: (108-159)/(44-76) 130/54 (10/13 0925) SpO2:  [95 %-100 %] 97 % (10/13 0925) Weight:  [64 kg] 64 kg (10/13 0800) Last BM Date: 03/17/19  Intake/Output from previous day: 10/12 0701 - 10/13 0700 In: 650 [P.O.:600; IV  Piggyback:50] Out: 150 [Urine:150] Intake/Output this shift: Total I/O In: 200 [I.V.:200] Out: -   PE: Gen:  Alert, NAD, pleasant, cooperative Pulm: Rate and effort normal Abd: Soft, ND, TTP in RLQ with guarding.  No peritonitis Skin: no rashes noted, warm and dry   Anti-infectives: Anti-infectives (From admission, onward)   Start     Dose/Rate Route Frequency Ordered Stop   03/16/19 0000  piperacillin-tazobactam (ZOSYN) IVPB 2.25 g     2.25 g 100 mL/hr over 30 Minutes Intravenous Every 8 hours 03/15/19 1527     03/15/19 1430  piperacillin-tazobactam (ZOSYN) IVPB 3.375 g     3.375 g 12.5 mL/hr over 240 Minutes Intravenous  Once 03/15/19 1423 03/15/19 1915      Lab Results:  Recent Labs    03/17/19 0346 03/18/19 0418  WBC 33.4* 31.2*  HGB 6.5* 8.1*  HCT 20.2* 24.6*  PLT 593* 623*   BMET Recent Labs    03/17/19 0346 03/18/19 0418  NA 138 140  K 4.4 4.0  CL 107 105  CO2 18* 20*  GLUCOSE 175* 165*  BUN 58* 50*  CREATININE 3.76* 3.56*  CALCIUM 9.2 9.2   PT/INR No results for input(s): LABPROT, INR in the last 72 hours. CMP     Component Value Date/Time   NA 140 03/18/2019 0418   K 4.0 03/18/2019 0418   CL 105 03/18/2019 0418   CO2 20 (L) 03/18/2019 0418   GLUCOSE 165 (H) 03/18/2019 0418   BUN 50 (H) 03/18/2019 0418   CREATININE  3.56 (H) 03/18/2019 0418   CALCIUM 9.2 03/18/2019 0418   PROT 7.9 03/15/2019 1204   ALBUMIN 2.0 (L) 03/18/2019 0418   AST 16 03/15/2019 1204   ALT 10 03/15/2019 1204   ALKPHOS 85 03/15/2019 1204   BILITOT 0.5 03/15/2019 1204   GFRNONAA 16 (L) 03/18/2019 0418   GFRAA 19 (L) 03/18/2019 0418   Lipase     Component Value Date/Time   LIPASE 51 03/15/2019 1204    Studies/Results: Ir Fluoro Guide Cv Line Right  Result Date: 03/18/2019 INDICATION: 68 year old male referred for central line placement EXAM: IMAGE GUIDED PLACEMENT OF CENTRAL VENOUS CATHETER MEDICATIONS: NONE ANESTHESIA/SEDATION: NONE FLUOROSCOPY TIME:   Fluoroscopy Time: 0 minutes 12 seconds (1 mGy). COMPLICATIONS: NONE PROCEDURE: Informed written consent was obtained from the patient after a thorough discussion of the procedural risks, benefits and alternatives. All questions were addressed. Maximal Sterile Barrier Technique was utilized including caps, mask, sterile gowns, sterile gloves, sterile drape, hand hygiene and skin antiseptic. A timeout was performed prior to the initiation of the procedure. Patient was positioned supine position on fluoroscopy table. The right neck was prepped and draped in the usual sterile fashion. 1% lidocaine was used for local anesthesia. Using ultrasound guidance, micropuncture access was performed at the right IJ. Once we confirmed wire position, we measured and internal length to 2 vertebral bodies below the carina. The catheter was modified on the back table and then placed through the peel-away sheath. Final image was stored. Aspiration was confirmed at the catheter site. Catheter was sutured in position and a sterile bandage was placed. Patient tolerated the procedure well and remained hemodynamically stable throughout. No complications were encountered and no significant blood loss. IMPRESSION: Status post image guided placement of right IJ central venous catheter. Signed, Dulcy Fanny. Dellia Nims, RPVI [facility] Radiology Electronically Signed   By: Corrie Mckusick D.O.   On: 03/18/2019 08:28   Ir US Guide Vasc Access Right  Result Date: 03/18/2019 INDICATION: 68 year old male referred for central line placement EXAM: IMAGE GUIDED PLACEMENT OF CENTRAL VENOUS CATHETER MEDICATIONS: NONE ANESTHESIA/SEDATION: NONE FLUOROSCOPY TIME:  Fluoroscopy Time: 0 minutes 12 seconds (1 mGy). COMPLICATIONS: NONE PROCEDURE: Informed written consent was obtained from the patient after a thorough discussion of the procedural risks, benefits and alternatives. All questions were addressed.  Maximal Sterile Barrier Technique was utilized including caps, mask, sterile gowns, sterile gloves, sterile drape, hand hygiene and skin antiseptic. A timeout was performed prior to the initiation of the procedure. Patient was positioned supine position on fluoroscopy table. The right neck was prepped and draped in the usual sterile fashion. 1% lidocaine was used for local anesthesia. Using ultrasound guidance, micropuncture access was performed at the right IJ. Once we confirmed wire position, we measured and internal length to 2 vertebral bodies below the carina. The catheter was modified on the back table and then placed through the peel-away sheath. Final image was stored. Aspiration was confirmed at the catheter site. Catheter was sutured in position and a sterile bandage was placed. Patient tolerated the procedure well and remained hemodynamically stable throughout. No complications were encountered and no significant blood loss. IMPRESSION: Status post image guided placement of right IJ central venous catheter. Signed, Dulcy Fanny. Dellia Nims, RPVI [facility] Radiology Electronically Signed   By: Corrie Mckusick D.O.   On: 03/18/2019 08:28     Kalman Drape, [facility] Surgery Pager [phone] Myna Hidalgo, W, & Friday 7:00am -  4:30pm Thursdays 7:00am -11:30am

## 2019-03-18 NOTE — Progress Notes (Signed)
[facility] PROGRESS NOTE  [name] DUK:[id number] DOB: [birth year] DOA: 03/15/2019 PCP: Nolene Ebbs, MD  Brief summary   [name] is a 68 y.o. male with medical history significant of sickle cell anemia (SS disease); ESRD not on HD; HTN; HLD; DM; and colon CA presenting with n/v.  He had a vocal cord lesion removed with CO2 laser excision during microlaryngoscopy on 10/7.  He went home and then started vomiting the evening of 10.7.  No vomiting prior to surgery.  It would come and go.  He has been losing a lot of weight and so has been drinking Boost.  Unintentional weight loss of about 50 pounds in the last 4-5 months.  He started having night sweats recently.  No LAD.  Slight diarrhea after the surgery, thought it was related to the anesthesia.  Normal BMs other than post-operatively.  Stools have been black because he has been taking iron.  He got 4 units of blood a week or two before the surgery, that increased his Hgb from 2.5 up to 8.9.  He has not yet started HD, still making urine.  His ex-wife has been caring for him for the last 3 weeks.  His colon cancer was 20 years ago.  He has never had a colonoscopy in over 25 years.  He was last admitted 8/13-15 for symptomatic anemia from CKD and given IV iron and Epogen.  Upon arrival to ED, patient had c/o abdominal pain and vomiting.  Had laryngeal biopsy a few days ago.  WBC 40k.  CT read as progression of colon CA at cecum and TI with ?typhlitis.  Patient was admitted under hospitalist service with GI and surgery consultation.  Patient underwent colonoscopy on 03/18/2019 which showed cecum and ascending colon mass which was biopsied.  Patient's hemoglobin dropped from 7.026.5 on 03/15/2019 and he received 1 unit of PRBC transfusion but then again it dropped to 6.5 so he received second unit of transfusion on 03/17/2019.  Assessment/Plan:  Colon cancer. Patient with remote h/o colon cancer (records not apparently  available) without subsequent follow-up, ex-wife reports no colonoscopy in 25 years -Presented with RLQ abdominal pain, and n/v; also with 50 pounds unintentional weight loss in the last 4-5 months and recent night sweats -Imaging indicates recurrent colon cancer in the cecum/TI. This appears to be at least stage 3 with positive LN, No obstruction on CT. CEA level-elevated.  Patient underwent colonoscopy on 03/18/2019 which showed cecum and ascending colon mass which was biopsied.  Surgery recommends palliative care consult for discussion of goals of care.  I have consulted them.  I have discussed with patient myself and he wants to discuss further with his son and ex-wife regarding what he wants to do from here.  Continue Dilaudid as needed for pain control.  Stage 5 CKD -Patient has been preparing for HD but has not yet started. Nephrology prn order set utilized.  Seen by nephrology.  His renal function is at his baseline.  No indication of any intervention at this point in time.   Acute blood loss anemia on chronic anemia secondary to colon cancer/lower GI bleed vs sickle cell anemia: -Likely multifactorial including h/o sickle cell disease and stage 5 CKD but most likely due to his recurrence of colon cancer.  Received 1 unit of PRBC on 03/16/2019 and 1 unit again on 03/17/2019.  Hemoglobin improved to 8.1 today.  Watch daily.  HTN -Blood pressure controlled.  Continue Norvasc, Coreg  Chronic diastolic CHF -  03/2017 echo with preserved EF and grade 1 diastolic dysfunction -Despite stage 5 CKD, he does not appear to be volume overloaded at this time. -If he becomes volume overloaded, the likely treatment would be HD.  DM -Hemoglobin A1c 6.9 on 03/15/2019.  Hold Onglyza. Cover with sensitive-scale SSI.  Blood sugar fluctuating but acceptable so far.  HLD  -Continue Lipitor  Cocaine abuse -Patient denies use since July, but was positive at the time of last admission in August. -UDS  negative this time.  Code Status: DNR Family Communication: Plan of care discussed with patient.  He had no further questions.  He is going to talk to his family about further plan of care. Disposition Plan: remains inpatient    Consultants:  Surgery  GI  Procedures:  Colonoscopy on 03/16/2089  Antibiotics: Anti-infectives (From admission, onward)   Start     Dose/Rate Route Frequency Ordered Stop   03/16/19 0000  piperacillin-tazobactam (ZOSYN) IVPB 2.25 g     2.25 g 100 mL/hr over 30 Minutes Intravenous Every 8 hours 03/15/19 1527     03/15/19 1430  piperacillin-tazobactam (ZOSYN) IVPB 3.375 g     3.375 g 12.5 mL/hr over 240 Minutes Intravenous  Once 03/15/19 1423 03/15/19 1915       (indicate start date, and stop date if known)  HPI/Subjective: Patient seen and examined post colonoscopy.  Complains of abdominal pain, 6 out of 10.  No other complaint.  Denies chest pain or shortness of breath.  Objective: Vitals:   03/18/19 0925 03/18/19 0954  BP: (!) 130/54 129/65  Pulse: 64 67  Resp: 14 16  Temp:  98.5 F (36.9 C)  SpO2: 97% 100%    Intake/Output Summary (Last 24 hours) at 03/18/2019 1153 Last data filed at 03/18/2019 0907 Gross per 24 hour  Intake 850 ml  Output 150 ml  Net 700 ml   Filed Weights   03/18/19 0800  Weight: 64 kg    Exam:  General exam: Appears calm and comfortable  Respiratory system: Clear to auscultation. Respiratory effort normal. Cardiovascular system: S1 & S2 heard, RRR. No JVD, murmurs, rubs, gallops or clicks. No pedal edema. Gastrointestinal system: Abdomen is nondistended, firm and generalized tender. No organomegaly or masses felt. Normal bowel sounds heard. Central nervous system: Alert and oriented. No focal neurological deficits. Extremities: Symmetric 5 x 5 power. Skin: No rashes, lesions or ulcers.  Psychiatry: Judgement and insight appear normal. Mood & affect appropriate.     Data Reviewed: Basic Metabolic  Panel: Recent Labs  Lab 03/12/19 0759 03/15/19 1204 03/16/19 0459 03/17/19 0346 03/18/19 0418  NA 131* 133* 136 138 140  K 4.8 5.1 5.0 4.4 4.0  CL 99 101 103 107 105  CO2 17* 21* 21* 18* 20*  GLUCOSE 160* 160* 189* 175* 165*  BUN 68* 56* 55* 58* 50*  CREATININE 4.26* 3.24* 3.38* 3.76* 3.56*  CALCIUM 9.5 9.3 9.2 9.2 9.2  PHOS  --   --   --   --  4.0   Liver Function Tests: Recent Labs  Lab 03/12/19 0759 03/15/19 1204 03/18/19 0418  AST 13* 16  --   ALT 10 10  --   ALKPHOS 100 85  --   BILITOT 0.8 0.5  --   PROT 8.8* 7.9  --   ALBUMIN 2.5* 2.5* 2.0*   Recent Labs  Lab 03/15/19 1204  LIPASE 51   No results for input(s): AMMONIA in the last 168 hours. CBC: Recent Labs  Lab 03/12/19  0102 03/15/19 1154 03/16/19 0459 03/17/19 0346 03/18/19 0418  WBC 34.2* 39.5* 43.2* 33.4* 31.2*  HGB 6.6* 7.0* 6.5* 6.5* 8.1*  HCT 20.4* 22.6* 19.8* 20.2* 24.6*  MCV 69.2* 70.4* 67.8* 72.4* 76.4*  PLT 782* 981* 746* 593* 623*   Cardiac Enzymes: No results for input(s): CKTOTAL, CKMB, CKMBINDEX, TROPONINI in the last 168 hours. BNP (last 3 results) Recent Labs    01/16/19 1820  BNP 1,036.2*    ProBNP (last 3 results) No results for input(s): PROBNP in the last 8760 hours.  CBG: Recent Labs  Lab 03/17/19 0842 03/17/19 1754 03/17/19 2148 03/18/19 0749 03/18/19 1145  GLUCAP 141* 148* 246* 164* 198*    Recent Results (from the past 240 hour(s))  SARS CORONAVIRUS 2 (TAT 6-24 HRS) Nasopharyngeal Nasopharyngeal Swab     Status: None   Collection Time: 03/10/19  9:32 AM   Specimen: Nasopharyngeal Swab  Result Value Ref Range Status   SARS Coronavirus 2 NEGATIVE NEGATIVE Final    Comment: (NOTE) SARS-CoV-2 target nucleic acids are NOT DETECTED. The SARS-CoV-2 RNA is generally detectable in upper and lower respiratory specimens during the acute phase of infection. Negative results do not preclude SARS-CoV-2 infection, do not rule out co-infections with other pathogens,  and should not be used as the sole basis for treatment or other patient management decisions. Negative results must be combined with clinical observations, patient history, and epidemiological information. The expected result is Negative. Fact Sheet for Patients: SugarRoll.be Fact Sheet for Healthcare Providers: https://www.woods-mathews.com/ This test is not yet approved or cleared by the [place] FDA and  has been authorized for detection and/or diagnosis of SARS-CoV-2 by FDA under an Emergency Use Authorization (EUA). This EUA will remain  in effect (meaning this test can be used) for the duration of the COVID-19 declaration under Section 56 4(b)(1) of the Act, 21 U.S.C. section 360bbb-3(b)(1), unless the authorization is terminated or revoked sooner. Performed at [facility], [place] [address] [place], [place] [address]   SARS CORONAVIRUS 2 (TAT 6-24 HRS) Nasopharyngeal Nasopharyngeal Swab     Status: None   Collection Time: 03/15/19  4:28 PM   Specimen: Nasopharyngeal Swab  Result Value Ref Range Status   SARS Coronavirus 2 NEGATIVE NEGATIVE Final    Comment: (NOTE) SARS-CoV-2 target nucleic acids are NOT DETECTED. The SARS-CoV-2 RNA is generally detectable in upper and lower respiratory specimens during the acute phase of infection. Negative results do not preclude SARS-CoV-2 infection, do not rule out co-infections with other pathogens, and should not be used as the sole basis for treatment or other patient management decisions. Negative results must be combined with clinical observations, patient history, and epidemiological information. The expected result is Negative. Fact Sheet for Patients: SugarRoll.be Fact Sheet for Healthcare Providers: https://www.woods-mathews.com/ This test is not yet approved or cleared by the [place] FDA and  has been authorized for detection  and/or diagnosis of SARS-CoV-2 by FDA under an Emergency Use Authorization (EUA). This EUA will remain  in effect (meaning this test can be used) for the duration of the COVID-19 declaration under Section 56 4(b)(1) of the Act, 21 U.S.C. section 360bbb-3(b)(1), unless the authorization is terminated or revoked sooner. Performed at [facility], [place] [address], [place], [place] [address]      Studies: Ir Cyndy Freeze Guide Cv Line Right  Result Date: 03/18/2019 INDICATION: 68 year old male referred for central line placement EXAM: IMAGE GUIDED PLACEMENT OF CENTRAL VENOUS CATHETER MEDICATIONS: NONE ANESTHESIA/SEDATION: NONE FLUOROSCOPY TIME:  Fluoroscopy Time: 0 minutes 12 seconds (1 mGy). COMPLICATIONS: NONE PROCEDURE: Informed written consent was obtained from the patient after a thorough discussion of the procedural risks, benefits and alternatives. All questions were addressed. Maximal Sterile Barrier Technique was utilized including caps, mask, sterile gowns, sterile gloves, sterile drape, hand hygiene and skin antiseptic. A timeout was performed prior to the initiation of the procedure. Patient was positioned supine position on fluoroscopy table. The right neck was prepped and draped in the usual sterile fashion. 1% lidocaine was used for local anesthesia. Using ultrasound guidance, micropuncture access was performed at the right IJ. Once we confirmed wire position, we measured and internal length to 2 vertebral bodies below the carina. The catheter was modified on the back table and then placed through the peel-away sheath. Final image was stored. Aspiration was confirmed at the catheter site. Catheter was sutured in position and a sterile bandage was placed. Patient tolerated the procedure well and remained hemodynamically stable throughout. No complications were encountered and no significant blood loss. IMPRESSION: Status post image guided placement of right IJ central venous catheter.  Signed, Dulcy Fanny. Dellia Nims, RPVI [facility] Electronically Signed   By: Corrie Mckusick D.O.   On: 03/18/2019 08:28   Ir US Guide Vasc Access Right  Result Date: 03/18/2019 INDICATION: 68 year old male referred for central line placement EXAM: IMAGE GUIDED PLACEMENT OF CENTRAL VENOUS CATHETER MEDICATIONS: NONE ANESTHESIA/SEDATION: NONE FLUOROSCOPY TIME:  Fluoroscopy Time: 0 minutes 12 seconds (1 mGy). COMPLICATIONS: NONE PROCEDURE: Informed written consent was obtained from the patient after a thorough discussion of the procedural risks, benefits and alternatives. All questions were addressed. Maximal Sterile Barrier Technique was utilized including caps, mask, sterile gowns, sterile gloves, sterile drape, hand hygiene and skin antiseptic. A timeout was performed prior to the initiation of the procedure. Patient was positioned supine position on fluoroscopy table. The right neck was prepped and draped in the usual sterile fashion. 1% lidocaine was used for local anesthesia. Using ultrasound guidance, micropuncture access was performed at the right IJ. Once we confirmed wire position, we measured and internal length to 2 vertebral bodies below the carina. The catheter was modified on the back table and then placed through the peel-away sheath. Final image was stored. Aspiration was confirmed at the catheter site. Catheter was sutured in position and a sterile bandage was placed. Patient tolerated the procedure well and remained hemodynamically stable throughout. No complications were encountered and no significant blood loss. IMPRESSION: Status post image guided placement of right IJ central venous catheter. Signed, Dulcy Fanny. Dellia Nims, RPVI [facility] Electronically Signed   By: Corrie Mckusick D.O.   On: 03/18/2019 08:28    Scheduled Meds: . sodium chloride   Intravenous Once  . sodium  chloride   Intravenous Once  . sodium chloride   Intravenous Once  . amLODipine  5 mg Oral Daily  . atorvastatin  20 mg Oral Daily  . carvedilol  25 mg Oral BID WC  . Chlorhexidine Gluconate Cloth  6 each Topical Daily  . docusate sodium  100 mg Oral BID  . feeding supplement  1 Container Oral TID BM  . folic acid  1 mg Oral Daily  . gabapentin  300 mg Oral TID  . insulin aspart  0-9 Units Subcutaneous TID WC  . metoCLOPramide (REGLAN) injection  10 mg Intravenous Once  . pantoprazole  40 mg Oral Daily  . sodium chloride flush  10-40 mL Intracatheter Q12H   Continuous Infusions: . piperacillin-tazobactam (ZOSYN)  IV 2.25 g (03/18/19 0747)    Principal Problem:   Colon cancer (HCC) Active Problems:   Hypertension   Type 2 diabetes mellitus with renal complication (HCC)   CKD (chronic kidney disease) stage 5, GFR less than 15 ml/min (HCC)   Anemia   Malignant neoplasm of cecum ([place])  Time spent: 30 minutes [facility] Pager [phone]. If 7PM-7AM, please contact night-coverage at [email or web address], password [facility] 03/18/2019, 11:53 AM  LOS: 3 days

## 2019-03-18 NOTE — Anesthesia Postprocedure Evaluation (Signed)
Anesthesia Post Note  Patient: [name]  Procedure(s) Performed: COLONOSCOPY WITH PROPOFOL (N/A ) BIOPSY     Patient location during evaluation: Endoscopy Anesthesia Type: MAC Level of consciousness: awake and alert Pain management: pain level controlled Vital Signs Assessment: post-procedure vital signs reviewed and stable Respiratory status: spontaneous breathing, nonlabored ventilation and respiratory function stable Cardiovascular status: stable and blood pressure returned to baseline Postop Assessment: no apparent nausea or vomiting Anesthetic complications: no    Last Vitals:  Vitals:   03/18/19 0915 03/18/19 0925  BP: 122/60 (!) 130/54  Pulse: 67 64  Resp: 16 14  Temp:    SpO2: 100% 97%    Last Pain:  Vitals:   03/18/19 0925  TempSrc:   PainSc: 7                  Annamaria Salah,W. EDMOND

## 2019-03-18 NOTE — Interval H&P Note (Signed)
History and Physical Interval Note:  03/18/2019 8:22 AM  [name]  has presented today for surgery, with the diagnosis of ABN ct SCAN, HIX OF COLON CA, ANEMIA, WT LOSS, ABD PAIN.  The various methods of treatment have been discussed with the patient and family. After consideration of risks, benefits and other options for treatment, the patient has consented to  Procedure(s): COLONOSCOPY WITH PROPOFOL (N/A) as a surgical intervention.  The patient's history has been reviewed, patient examined, no change in status, stable for surgery.  I have reviewed the patient's chart and labs.  Questions were answered to the patient's satisfaction.     Silvano Rusk

## 2019-03-18 NOTE — Transfer of Care (Signed)
Immediate Anesthesia Transfer of Care Note  Patient: [name]  Procedure(s) Performed: COLONOSCOPY WITH PROPOFOL (N/A ) BIOPSY  Patient Location: Endoscopy Unit  Anesthesia Type:MAC  Level of Consciousness: drowsy  Airway & Oxygen Therapy: Patient Spontanous Breathing and Patient connected to nasal cannula oxygen  Post-op Assessment: Report given to RN and Post -op Vital signs reviewed and stable  Post vital signs: Reviewed and stable  Last Vitals:  Vitals Value Taken Time  BP    Temp    Pulse 62 03/18/19 0907  Resp 19 03/18/19 0907  SpO2 100 % 03/18/19 0907  Vitals shown include unvalidated device data.  Last Pain:  Vitals:   03/18/19 0800  TempSrc: Oral  PainSc: 5       Patients Stated Pain Goal: 2 (94/50/38 8828)  Complications: No apparent anesthesia complications

## 2019-03-19 ENCOUNTER — Other Ambulatory Visit: Payer: Self-pay

## 2019-03-19 ENCOUNTER — Encounter (HOSPITAL_COMMUNITY): Payer: Self-pay | Admitting: General Practice

## 2019-03-19 ENCOUNTER — Telehealth: Payer: Self-pay | Admitting: *Deleted

## 2019-03-19 DIAGNOSIS — N185 Chronic kidney disease, stage 5: Secondary | ICD-10-CM | POA: Diagnosis not present

## 2019-03-19 DIAGNOSIS — E1122 Type 2 diabetes mellitus with diabetic chronic kidney disease: Secondary | ICD-10-CM | POA: Diagnosis not present

## 2019-03-19 DIAGNOSIS — Z7189 Other specified counseling: Secondary | ICD-10-CM | POA: Diagnosis not present

## 2019-03-19 DIAGNOSIS — Z66 Do not resuscitate: Secondary | ICD-10-CM

## 2019-03-19 DIAGNOSIS — Z515 Encounter for palliative care: Secondary | ICD-10-CM

## 2019-03-19 DIAGNOSIS — N186 End stage renal disease: Secondary | ICD-10-CM

## 2019-03-19 DIAGNOSIS — C180 Malignant neoplasm of cecum: Secondary | ICD-10-CM

## 2019-03-19 DIAGNOSIS — Z01810 Encounter for preprocedural cardiovascular examination: Secondary | ICD-10-CM

## 2019-03-19 DIAGNOSIS — C182 Malignant neoplasm of ascending colon: Secondary | ICD-10-CM | POA: Diagnosis not present

## 2019-03-19 DIAGNOSIS — Z992 Dependence on renal dialysis: Secondary | ICD-10-CM

## 2019-03-19 LAB — CBC WITH DIFFERENTIAL/PLATELET
Abs Immature Granulocytes: 0.13 10*3/uL — ABNORMAL HIGH (ref 0.00–0.07)
Basophils Absolute: 0.1 10*3/uL (ref 0.0–0.1)
Basophils Relative: 1 %
Eosinophils Absolute: 1.3 10*3/uL — ABNORMAL HIGH (ref 0.0–0.5)
Eosinophils Relative: 5 %
HCT: 26.6 % — ABNORMAL LOW (ref 39.0–52.0)
Hemoglobin: 8.5 g/dL — ABNORMAL LOW (ref 13.0–17.0)
Immature Granulocytes: 1 %
Lymphocytes Relative: 12 %
Lymphs Abs: 3 10*3/uL (ref 0.7–4.0)
MCH: 24.7 pg — ABNORMAL LOW (ref 26.0–34.0)
MCHC: 32 g/dL (ref 30.0–36.0)
MCV: 77.3 fL — ABNORMAL LOW (ref 80.0–100.0)
Monocytes Absolute: 3.8 10*3/uL — ABNORMAL HIGH (ref 0.1–1.0)
Monocytes Relative: 16 %
Neutro Abs: 16.2 10*3/uL — ABNORMAL HIGH (ref 1.7–7.7)
Neutrophils Relative %: 65 %
Platelets: 630 10*3/uL — ABNORMAL HIGH (ref 150–400)
RBC: 3.44 MIL/uL — ABNORMAL LOW (ref 4.22–5.81)
RDW: 26.7 % — ABNORMAL HIGH (ref 11.5–15.5)
WBC: 24.5 10*3/uL — ABNORMAL HIGH (ref 4.0–10.5)
nRBC: 0.2 % (ref 0.0–0.2)

## 2019-03-19 LAB — GLUCOSE, CAPILLARY
Glucose-Capillary: 108 mg/dL — ABNORMAL HIGH (ref 70–99)
Glucose-Capillary: 106 mg/dL — ABNORMAL HIGH (ref 70–99)
Glucose-Capillary: 147 mg/dL — ABNORMAL HIGH (ref 70–99)
Glucose-Capillary: 144 mg/dL — ABNORMAL HIGH (ref 70–99)

## 2019-03-19 LAB — SURGICAL PATHOLOGY

## 2019-03-19 LAB — BASIC METABOLIC PANEL
Anion gap: 11 (ref 5–15)
BUN: 42 mg/dL — ABNORMAL HIGH (ref 8–23)
CO2: 21 mmol/L — ABNORMAL LOW (ref 22–32)
Calcium: 9.2 mg/dL (ref 8.9–10.3)
Chloride: 109 mmol/L (ref 98–111)
Creatinine, Ser: 3.33 mg/dL — ABNORMAL HIGH (ref 0.61–1.24)
GFR calc Af Amer: 21 mL/min — ABNORMAL LOW (ref >60)
GFR calc non Af Amer: 18 mL/min — ABNORMAL LOW (ref >60)
Glucose, Bld: 126 mg/dL — ABNORMAL HIGH (ref 70–99)
Potassium: 4.2 mmol/L (ref 3.5–5.1)
Sodium: 141 mmol/L (ref 135–145)

## 2019-03-19 NOTE — Anesthesia Preprocedure Evaluation (Addendum)
Anesthesia Evaluation  Patient identified by MRN, date of birth, ID band Patient awake    Reviewed: Allergy & Precautions, H&P , NPO status , Patient's Chart, lab work & pertinent test results  Airway Mallampati: II  TM Distance: >3 FB Neck ROM: Full    Dental no notable dental hx. (+) Edentulous Upper, Partial Lower, Dental Advisory Given   Pulmonary neg pulmonary ROS, former smoker,    Pulmonary exam normal breath sounds clear to auscultation       Cardiovascular Exercise Tolerance: Good hypertension, Pt. on medications and Pt. on home beta blockers +CHF   Rhythm:Regular Rate:Normal     Neuro/Psych negative neurological ROS  negative psych ROS   GI/Hepatic Neg liver ROS, GERD  Medicated and Controlled,  Endo/Other  diabetes, Insulin Dependent  Renal/GU ESRF and DialysisRenal disease  negative genitourinary   Musculoskeletal  (+) Arthritis , Osteoarthritis,    Abdominal   Peds  Hematology  (+) Blood dyscrasia, Sickle cell anemia and anemia ,   Anesthesia Other Findings   Reproductive/Obstetrics negative OB ROS                            Anesthesia Physical Anesthesia Plan  ASA: III  Anesthesia Plan: MAC   Post-op Pain Management:    Induction: Intravenous  PONV Risk Score and Plan: 2 and Propofol infusion and Ondansetron  Airway Management Planned: Simple Face Mask  Additional Equipment:   Intra-op Plan:   Post-operative Plan:   Informed Consent: I have reviewed the patients History and Physical, chart, labs and discussed the procedure including the risks, benefits and alternatives for the proposed anesthesia with the patient or authorized representative who has indicated his/her understanding and acceptance.     Dental advisory given  Plan Discussed with: CRNA  Anesthesia Plan Comments:         Anesthesia Quick Evaluation

## 2019-03-19 NOTE — Progress Notes (Addendum)
Nutrition Follow-up  DOCUMENTATION CODES:   Not applicable  INTERVENTION:   -Continue Boost Breeze po TID, each supplement provides 250 kcal and 9 grams of protein -RD will follow for diet advancement and supplement as appropriate  NUTRITION DIAGNOSIS:   Inadequate oral intake related to altered GI function as evidenced by NPO status.  Progressing  GOAL:   Patient will meet greater than or equal to 90% of their needs  Progressing   MONITOR:   PO intake, Supplement acceptance, Diet advancement, Labs, Weight trends, Skin, I & O's  REASON FOR ASSESSMENT:   Malnutrition Screening Tool    ASSESSMENT:   [name] is a 68 y.o. male with medical history significant of sickle cell anemia (SS disease); ESRD not on HD; HTN; HLD; DM; and colon CA presenting with n/v.  He had a vocal cord lesion removed with CO2 laser excision during microlaryngoscopy on 10/7.  He went home and then started vomiting the evening of 10.7.  No vomiting prior to surgery.  It would come and go.  He has been losing a lot of weight and so has been drinking Boost.  Unintentional weight loss of about 50 pounds in the last 4-5 months.  He started having night sweats recently.  No LAD.  Slight diarrhea after the surgery, thought it was related to the anesthesia.  Normal BMs other than post-operatively.  Stools have been black because he has been taking iron.  He got 4 units of blood a week or two before the surgery, that increased his Hgb from 2.5 up to 8.9.  He has not yet started HD, still making urine.  10/13- s/p colonoscopy 10/14- pathology on colon mass reveals poorly differentiated carcinoma  Reviewed I/O's: +1.1 L x 24 hours and +1.7 L since admission  UOP: 225 ml x 24 hours  Pt in with MD at time of visit. Pt appeared very thin and frail by appearance- suspect pt with malnutrition.   Case discussed with RN, who reports pt with likely recurrence of colon cancer. Pt desires aggressive treatments (HD  cath placement, colon surgery, and chemo) for now, but will agrees to transition to comfort care if he further declines from interventions. Pt to remains on clear liquids per GI notes. Per RN, he takes liquids and medications well. He is also drinking Boost Breeze supplements.   Per [facility] notes, plan for HD cath placement tomorrow (03/20/19). He is awaiting cardiology clearance for likely colon surgery.  .  Labs reviewed: CBGS: 106-166.   Diet Order:   Diet Order            Diet NPO time specified Except for: Sips with Meds  Diet effective midnight        Diet clear liquid Room service appropriate? Yes; Fluid consistency: Thin  Diet effective now              EDUCATION NEEDS:   No education needs have been identified at this time  Skin:  Skin Assessment: Reviewed RN Assessment  Last BM:  03/17/19  Height:   Ht Readings from Last 1 Encounters:  03/18/19 6\' 1"  (1.854 m)    Weight:   Wt Readings from Last 1 Encounters:  03/18/19 64 kg    Ideal Body Weight:  83.6 kg  BMI:  Body mass index is 18.62 kg/m.  Estimated Nutritional Needs:   Kcal:  2050-2250  Protein:  110-125 grams  Fluid:  2-2.2 L    [name] A. Jimmye Norman, [place], LDN, CDCES  Registered Dietitian II Certified Diabetes Care and Education Specialist Pager: [phone] After hours Pager: [phone]

## 2019-03-19 NOTE — Consult Note (Signed)
Cardiology Consultation:   Patient ID: [id number] PEDROSA; [id number]; 1950-12-04   Admit date: 03/15/2019 Date of Consult: 03/19/2019  Primary Care Provider: Nolene Ebbs, MD Primary Cardiologist: Pixie Casino, MD Primary Electrophysiologist:  None    Patient Profile:   [name] is a 68 y.o. male with a PMH of HTN, HLD, DM type 2, sickle cell anemia, CKD stage not on HD, and colon cancer s/p partial colectomy ~20 years ago, who is being seen today for the evaluation of preoperative assessment at the request of Dr. Doristine Bosworth.  History of Present Illness:   Mr. Huebert was admitted to the hospital 03/15/2019 with nausea and vomiting. He underwent biopsy of a vocal cord lesion 03/12/2019 with otolaryngology and subsequently developed nausea and vomiting that evening. Imaging this admission was concerning for recurrent colon cancer in the cecum. CEA level elevated. He underwent colonoscopy 03/18/2019 which showed cecum and ascending colon mass, biopsies revealed poorly differentiated carcinoma. Surgery was consulted and agreeable to performing palliative right hemicolectomy. Vascular surgery planning to place AV fistula at the time of colon surgery. Cardiology asked to evaluate for preoperative assessment.   He was last evaluated by cardiology at an outpatient visit with Dr. Debara Pickett 07/2018 for preoperative assessment. He was without cardiac complaints at that time and cleared to proceed with AV fistula placement without further cardiac testing. His last ischemic evaluation was a NST 2017 which revealed a fixed defect likely 2/2 LBBB, no reversible ischemia. Echocardiogram in 2018 showed EF 55-60%, G1DD, no RWMA, and mild MR.   At the time of this evaluation he still has some RLQ pain. He is feeling confused about what surgery is taking place and when. He understands he has recurrent colon cancer and is interested in prolonging his life with surgical intervention. From a cardiac standpoint he  reports some DOE over the past few weeks. He attributed this to the growth on his vocal cord. He feels like his breathing might be better since removal of the growth but has not been able to stress himself in the hospital. He reports he is able to complete ADLs, do light household chores, and walk a block without chest pain or SOB. He denies orthopnea, PND, LE edema, dizziness, lightheadedness, palpitations, or syncope.   Past Medical History:  Diagnosis Date   Arthritis    Cancer ([place])    colon   Cataracts, bilateral    surgery to remove   Diabetes mellitus    type 2   GERD (gastroesophageal reflux disease)    Heart murmur    History of kidney stones    passed stone, no surgery   Hyperlipidemia    Hypertension    Lesion of vocal cord    Pneumonia 2019   x 2   Renal insufficiency    no dialysis - stagte 5   Sickle cell anemia ([facility])    Hgb SS disease    Wears glasses     Past Surgical History:  Procedure Laterality Date   AV FISTULA PLACEMENT Left 11/22/2016   Procedure: INSERTION OF ARTERIOVENOUS (AV) GORE-TEX GRAFT  LEFT FOREARM USING 6MM X 50CM GORETEX GRAFT;  Surgeon: Serafina Mitchell, MD;  Location: [facility];  Service: Vascular;  Laterality: Left;   BIOPSY  03/18/2019   Procedure: BIOPSY;  Surgeon: Gatha Mayer, MD;  Location: [place];  Service: Endoscopy;;   CATARACT EXTRACTION W/ INTRAOCULAR LENS  IMPLANT, BILATERAL     CHOLECYSTECTOMY     CO2 LASER APPLICATION  N/A 03/12/2019   Procedure: CO2 LASER EXCISION OF VOCAL FOLD LESION;  Surgeon: Melida Quitter, MD;  Location: [facility];  Service: ENT;  Laterality: N/A;   COLON SURGERY     Colon cancer surgery, removed small amt not full colectomy   COLONOSCOPY     nclear who follows him for this   COLONOSCOPY WITH PROPOFOL N/A 03/18/2019   Procedure: COLONOSCOPY WITH PROPOFOL;  Surgeon: Gatha Mayer, MD;  Location: [place];  Service: Endoscopy;  Laterality: N/A;   DIRECT LARYNGOSCOPY N/A  03/12/2019   Procedure: SUSPENDED MICRO DIRECT LARYNGOSCOPY;  Surgeon: Melida Quitter, MD;  Location: [facility];  Service: ENT;  Laterality: N/A;   IR FLUORO GUIDE CV LINE RIGHT  03/17/2019   IR US GUIDE VASC ACCESS RIGHT  03/17/2019   LEG SURGERY     For ? gangrene after running into barbwire fence at 68yo   MULTIPLE TOOTH EXTRACTIONS     no dentures     Home Medications:  Prior to Admission medications   Medication Sig Start Date End Date Taking? Authorizing Provider  acetaminophen (TYLENOL) 500 MG tablet Take 500 mg by mouth every 6 (six) hours as needed for headache (pain).   Yes [provider]  amLODipine (NORVASC) 5 MG tablet Take 5 mg by mouth daily. 10/04/17  Yes [provider]  atorvastatin (LIPITOR) 20 MG tablet Take 20 mg by mouth daily.  02/01/18  Yes [provider]  calcitRIOL (ROCALTROL) 0.25 MCG capsule Take 0.25 mcg by mouth every other day. In the morning. 04/25/18  Yes [provider]  calcium acetate (PHOSLO) 667 MG capsule Take 667 mg by mouth 3 (three) times daily with meals.   Yes [provider]  carvedilol (COREG) 25 MG tablet Take 25 mg by mouth 2 (two) times daily. 09/04/17  Yes [provider]  ferrous sulfate (FEOSOL) 325 (65 FE) MG tablet Take 1 tablet (325 mg total) by mouth 2 (two) times daily with a meal. Patient taking differently: Take 325 mg by mouth 2 (two) times daily.  01/18/19  Yes Vasireddy, Grier Mitts, MD  folic acid (FOLVITE) 884 MCG tablet Take 800 mcg by mouth daily.   Yes [provider]  furosemide (LASIX) 40 MG tablet Take 40 mg by mouth See admin instructions. Morning & 1400    Yes [provider]  gabapentin (NEURONTIN) 300 MG capsule Take 1 capsule (300 mg total) by mouth at bedtime. Patient taking differently: Take 300 mg by mouth 3 (three) times daily.  01/18/19  Yes Vasireddy, Grier Mitts, MD  insulin aspart (NOVOLOG FLEXPEN) 100 UNIT/ML FlexPen Inject 2-6 Units into the skin 3  (three) times daily as needed for high blood sugar (CBG >150).   Yes [provider]  omeprazole (PRILOSEC) 20 MG capsule Take 20 mg by mouth daily.    Yes [provider]  ONGLYZA 5 MG TABS tablet Take 5 mg by mouth daily.  06/21/18  Yes [provider]  polyethylene glycol powder (MIRALAX) powder Take 17 g by mouth daily. Patient taking differently: Take 17 g by mouth daily as needed (constipation). Mix in 8 oz liquid and drink 10/28/16  Yes Palumbo, April, MD  VENTOLIN HFA 108 (90 Base) MCG/ACT inhaler Inhale 2 puffs into the lungs every 6 (six) hours as needed for wheezing.  09/03/17  Yes [provider]    Inpatient Medications: Scheduled Meds:  amLODipine  5 mg Oral Daily   atorvastatin  20 mg Oral Daily   carvedilol  25 mg Oral BID WC   Chlorhexidine Gluconate Cloth  6 each Topical Daily   docusate sodium  100 mg Oral BID   feeding supplement  1 Container Oral TID BM   folic acid  1 mg Oral Daily   gabapentin  300 mg Oral TID   insulin aspart  0-9 Units Subcutaneous TID WC   metoCLOPramide (REGLAN) injection  10 mg Intravenous Once   pantoprazole  40 mg Oral Daily   sodium chloride flush  10-40 mL Intracatheter Q12H   Continuous Infusions:  piperacillin-tazobactam (ZOSYN)  IV 2.25 g (03/19/19 0832)   PRN Meds: acetaminophen **OR** acetaminophen, albuterol, calcium carbonate (dosed in mg elemental calcium), camphor-menthol **AND** hydrOXYzine, docusate sodium, HYDROmorphone (DILAUDID) injection, HYDROmorphone, lidocaine, ondansetron **OR** ondansetron (ZOFRAN) IV, sodium chloride flush, sorbitol, zolpidem  Allergies:    Allergies  Allergen Reactions   Morphine And Related Itching    Social History:   Social History   Socioeconomic History   Marital status: Divorced    Spouse name: Not on file   Number of children: Not on file   Years of education: Not on file   Highest education level: Not on file  Occupational  History   Occupation: retired  Scientist, product/process development strain: Not on file   Food insecurity    Worry: Not on file    Inability: Not on Lexicographer needs    Medical: Not on file    Non-medical: Not on file  Tobacco Use   Smoking status: Former Smoker    Packs/day: 0.50    Years: 20.00    Pack years: 10.00    Types: Cigarettes    Quit date: 06/05/1966    Years since quitting: 52.8   Smokeless tobacco: Never Used  Substance and Sexual Activity   Alcohol use: Yes    Comment: rare beer, last use maybe July   Drug use: Not Currently    Types: Cocaine, "Crack" cocaine    Comment: Last use July 2020   Sexual activity: Not on file  Lifestyle   Physical activity    Days per week: Not on file    Minutes per session: Not on file   Stress: Not on file  Relationships   Social connections    Talks on phone: Not on file    Gets together: Not on file    Attends religious service: Not on file    Active member of club or organization: Not on file    Attends meetings of clubs or organizations: Not on file    Relationship status: Not on file   Intimate partner violence    Fear of current or ex partner: Not on file    Emotionally abused: Not on file    Physically abused: Not on file    Forced sexual activity: Not on file  Other Topics Concern   Not on file  Social History Narrative   Retired Administrator, on disability. Former smoker. Living by himself, able to get around [facility].    Has a wife who checks on him but they don't live together sicne 2007   Quit drinking     Family History:    Family History  Problem Relation Age of Onset   Venous thrombosis Mother        Dec. of blood clot    Diabetes type II Mother    AAA (abdominal aortic aneurysm) Mother    Throat cancer Father  Deceased of throat cancer     ROS:  Please see the history of present illness.   All other ROS reviewed and negative.     Physical Exam/Data:   Vitals:    03/18/19 1434 03/18/19 1934 03/19/19 0606 03/19/19 1448  BP: 120/61 131/68 127/68 121/82  Pulse: 68 66 (!) 55 69  Resp: 18 18 16 18   Temp: 98.7 F (37.1 C) 98.5 F (36.9 C) 97.7 F (36.5 C) 98 F (36.7 C)  TempSrc: Oral Oral Oral Oral  SpO2: 96% 98% 98% 98%  Weight:      Height:        Intake/Output Summary (Last 24 hours) at 03/19/2019 1552 Last data filed at 03/19/2019 0819 Gross per 24 hour  Intake 687.93 ml  Output 525 ml  Net 162.93 ml   Filed Weights   03/18/19 0800  Weight: 64 kg   Body mass index is 18.62 kg/m.  General:  Thin elderly gentleman laying in bed in no acute distress HEENT: sclera anicteric  Neck: no JVD Vascular: No carotid bruits; distal pulses 2+ bilaterally Cardiac:  normal S1, S2; RRR; no murmurs, rubs, or gallops Lungs:  clear to auscultation bilaterally, no wheezing, rhonchi or rales  Abd: NABS, soft, mild RLQ TTP, no hepatomegaly Ext: no edema Musculoskeletal:  No deformities, BUE and BLE strength normal and equal Skin: warm and dry  Neuro:  CNs 2-12 intact, no focal abnormalities noted Psych:  Normal affect   EKG:  The EKG was personally reviewed and demonstrates:  No EKGs this admission; chronic LBBB on prior EKGs Telemetry:  Not on telemetry  Relevant CV Studies: Echocardiogram 03/2017 Study Conclusions  - Left ventricle: The cavity size was normal. There was moderate   concentric hypertrophy. Systolic function was normal. The   estimated ejection fraction was in the range of 55% to 60%. Wall   motion was normal; there were no regional wall motion   abnormalities. There was an increased relative contribution of   atrial contraction to ventricular filling. Doppler parameters are   consistent with abnormal left ventricular relaxation (grade 1   diastolic dysfunction). Doppler parameters are consistent with   high ventricular filling pressure. - Aortic valve: Valve area (Vmax): 2.69 cm^2. - Mitral valve: Calcified annulus. There  was mild regurgitation. - Right ventricle: The cavity size was moderately dilated. Wall   thickness was normal. - Right atrium: The atrium was mildly dilated.  NST 2017: 1. Large fixed defect of the septum and inferior walls compatible with remote infarcts/scar.  No significant inducible ischemia with pharmacologic stress.  2. Inferior hypokinesis and septal dyskinesis.  3. Left ventricular ejection fraction 55%  4. Non invasive risk stratification*: High  Laboratory Data:  Chemistry Recent Labs  Lab 03/17/19 0346 03/18/19 0418 03/19/19 1026  NA 138 140 141  K 4.4 4.0 4.2  CL 107 105 109  CO2 18* 20* 21*  GLUCOSE 175* 165* 126*  BUN 58* 50* 42*  CREATININE 3.76* 3.56* 3.33*  CALCIUM 9.2 9.2 9.2  GFRNONAA 15* 16* 18*  GFRAA 18* 19* 21*  ANIONGAP 13 15 11     Recent Labs  Lab 03/15/19 1204 03/18/19 0418  PROT 7.9  --   ALBUMIN 2.5* 2.0*  AST 16  --   ALT 10  --   ALKPHOS 85  --   BILITOT 0.5  --    Hematology Recent Labs  Lab 03/17/19 0346 03/18/19 0418 03/19/19 1026  WBC 33.4* 31.2* 24.5*  RBC 2.79* 3.22* 3.44*  HGB 6.5* 8.1* 8.5*  HCT 20.2* 24.6* 26.6*  MCV 72.4* 76.4* 77.3*  MCH 23.3* 25.2* 24.7*  MCHC 32.2 32.9 32.0  RDW 27.7* 26.1* 26.7*  PLT 593* 623* 630*   Cardiac EnzymesNo results for input(s): TROPONINI in the last 168 hours. No results for input(s): TROPIPOC in the last 168 hours.  BNPNo results for input(s): BNP, PROBNP in the last 168 hours.  DDimer No results for input(s): DDIMER in the last 168 hours.  Radiology/Studies:  Ir Cyndy Freeze Guide Cv Line Right  Result Date: 03/18/2019 INDICATION: 68 year old male referred for central line placement EXAM: IMAGE GUIDED PLACEMENT OF CENTRAL VENOUS CATHETER MEDICATIONS: NONE ANESTHESIA/SEDATION: NONE FLUOROSCOPY TIME:  Fluoroscopy Time: 0 minutes 12 seconds (1 mGy). COMPLICATIONS: NONE PROCEDURE: Informed written consent was obtained from the patient after a thorough discussion of the  procedural risks, benefits and alternatives. All questions were addressed. Maximal Sterile Barrier Technique was utilized including caps, mask, sterile gowns, sterile gloves, sterile drape, hand hygiene and skin antiseptic. A timeout was performed prior to the initiation of the procedure. Patient was positioned supine position on fluoroscopy table. The right neck was prepped and draped in the usual sterile fashion. 1% lidocaine was used for local anesthesia. Using ultrasound guidance, micropuncture access was performed at the right IJ. Once we confirmed wire position, we measured and internal length to 2 vertebral bodies below the carina. The catheter was modified on the back table and then placed through the peel-away sheath. Final image was stored. Aspiration was confirmed at the catheter site. Catheter was sutured in position and a sterile bandage was placed. Patient tolerated the procedure well and remained hemodynamically stable throughout. No complications were encountered and no significant blood loss. IMPRESSION: Status post image guided placement of right IJ central venous catheter. Signed, Dulcy Fanny. Dellia Nims, RPVI [facility] Radiology Electronically Signed   By: Corrie Mckusick D.O.   On: 03/18/2019 08:28   Ir US Guide Vasc Access Right  Result Date: 03/18/2019 INDICATION: 68 year old male referred for central line placement EXAM: IMAGE GUIDED PLACEMENT OF CENTRAL VENOUS CATHETER MEDICATIONS: NONE ANESTHESIA/SEDATION: NONE FLUOROSCOPY TIME:  Fluoroscopy Time: 0 minutes 12 seconds (1 mGy). COMPLICATIONS: NONE PROCEDURE: Informed written consent was obtained from the patient after a thorough discussion of the procedural risks, benefits and alternatives. All questions were addressed. Maximal Sterile Barrier Technique was utilized including caps, mask, sterile gowns, sterile gloves, sterile drape, hand hygiene and skin antiseptic. A timeout was performed  prior to the initiation of the procedure. Patient was positioned supine position on fluoroscopy table. The right neck was prepped and draped in the usual sterile fashion. 1% lidocaine was used for local anesthesia. Using ultrasound guidance, micropuncture access was performed at the right IJ. Once we confirmed wire position, we measured and internal length to 2 vertebral bodies below the carina. The catheter was modified on the back table and then placed through the peel-away sheath. Final image was stored. Aspiration was confirmed at the catheter site. Catheter was sutured in position and a sterile bandage was placed. Patient tolerated the procedure well and remained hemodynamically stable throughout. No complications were encountered and no significant blood loss. IMPRESSION: Status post image guided placement of right IJ central venous catheter. Signed, Dulcy Fanny. Dellia Nims, RPVI [facility] Radiology Electronically Signed   By: Corrie Mckusick D.O.   On: 03/18/2019 08:28    Assessment and Plan:   1. Preoperative assessment: patient with recurrent colon cancer,  planned for palliative right hemicolectomy and AV fistula placement. He has no known heart disease history. He has a chronic LBBB on EKG. NST in 2017 with findings c/w LBBB but no ischemia. Echo 2018 with EF 55-60%, G1DD, no RWMA, and mild MR. He can complete 4 METs without anginal complaints. He has had some increased SOB when going up stairs recently which he attributed to his vocal cord growth. No history of CHF or CAD. His surgical risk is non-cardiac given intraabdominal surgery, Cr >2, and DM type 2 with insulin use. - Based on the revised cardiac risk index, this patient has a score of 3 with a 15% risk of adverse cardiac events in the perioperative setting - No further cardiac work-up needed prior to surgery  2. Recurrent colon cancer: planned for palliative right hemicolectomy. Poor prognosis.  Palliative care consulted for goals of care - Continue management per primary team, surgery, and GI  3. HTN: BP stable  - Continue amlodipine and carvedilol  4. HLD: LDL 68 - Continue atorvastatin  5. Acute blood loss anemia in patient with colon cancer and history of sickle cell anemia: Hgb stable at 8.5 today - Continue to monitor closely in the perioperative setting  6. CKD stage IV-V: not on HD yet. Vascular surgery planning for AV fistula placement at time of hemicolectomy in anticipation of HD.  - Continue management per primary team, nephrology, and vascular surgery  7. Mitral regurgitation: mild on echo in 2017 - Can consider surveillance echo outpatient after recovery from surgery.    For questions or updates, please contact [place] Please consult [email or web address] for contact info under Cardiology/STEMI.   Signed, Abigail Butts, PA-C  03/19/2019 3:52 PM [phone]

## 2019-03-19 NOTE — Progress Notes (Signed)
    Pathology on colon mass is poorly differentiated carcinoma.  Gatha Mayer, MD, [facility] Gastroenterology 03/19/2019 9:46 AM Pager [phone]

## 2019-03-19 NOTE — Progress Notes (Signed)
[facility] PROGRESS NOTE  Tysin Salada Alexie GBT:[id number] DOB: May 13, 1951 DOA: 03/15/2019 PCP: Nolene Ebbs, MD  Brief summary   [name] is a 68 y.o. male with medical history significant of sickle cell anemia (SS disease); ESRD not on HD; HTN; HLD; DM; and colon CA presenting with n/v.  He had a vocal cord lesion removed with CO2 laser excision during microlaryngoscopy on 10/7.  He went home and then started vomiting the evening of 10.7.  No vomiting prior to surgery.  It would come and go.  He has been losing a lot of weight and so has been drinking Boost.  Unintentional weight loss of about 50 pounds in the last 4-5 months.  He started having night sweats recently.  No LAD.  Slight diarrhea after the surgery, thought it was related to the anesthesia.  Normal BMs other than post-operatively.  Stools have been black because he has been taking iron.  He got 4 units of blood a week or two before the surgery, that increased his Hgb from 2.5 up to 8.9.  He has not yet started HD, still making urine.  His ex-wife has been caring for him for the last 3 weeks.  His colon cancer was 20 years ago.  He has never had a colonoscopy in over 25 years.  He was last admitted 8/13-15 for symptomatic anemia from CKD and given IV iron and Epogen.  Upon arrival to ED, patient had c/o abdominal pain and vomiting.  Had laryngeal biopsy a few days ago.  WBC 40k.  CT read as progression of colon CA at cecum and TI with ?typhlitis.  Patient was admitted under hospitalist service with GI and surgery consultation.  Patient underwent colonoscopy on 03/18/2019 which showed cecum and ascending colon mass which was biopsied.  Patient's hemoglobin dropped from 7.026.5 on 03/15/2019 and he received 1 unit of PRBC transfusion but then again it dropped to 6.5 so he received second unit of transfusion on 03/17/2019.  Assessment/Plan:  Colon cancer. Patient with remote h/o colon cancer (records not apparently  available) without subsequent follow-up, ex-wife reports no colonoscopy in 25 years -Presented with RLQ abdominal pain, and n/v; also with 50 pounds unintentional weight loss in the last 4-5 months and recent night sweats -Imaging indicates recurrent colon cancer in the cecum/TI. This appears to be at least stage 3 with positive LN, No obstruction on CT. CEA level-elevated.  Patient underwent colonoscopy on 03/18/2019 which showed cecum and ascending colon mass which was biopsied.  Patient has now decided to proceed with surgery.  General surgery aware.  They have consulted cardiology for cardiac clearance.  Palliative care was consulted as well.  Continue Dilaudid for pain control.  Pain is a stable at 6 out of 10.    Stage 5 CKD -Patient has been preparing for HD but has not yet started. Nephrology prn order set utilized.  Seen by nephrology.  His renal function is at his baseline with some improvement today.  Nephrology has consulted vascular surgery for temporary HD and fistula in anticipation for hemodialysis.  Continues to have good urine output.  Acute blood loss anemia on chronic anemia secondary to colon cancer/lower GI bleed vs sickle cell anemia: -Likely multifactorial including h/o sickle cell disease and stage 5 CKD but most likely due to his recurrence of colon cancer.  Received 1 unit of PRBC on 03/16/2019 and 1 unit again on 03/17/2019.  Hemoglobin improved to 8.5 today.  Watch daily.  HTN -Blood pressure  controlled.  Continue Norvasc, Coreg  Chronic diastolic CHF -54/6568 echo with preserved EF and grade 1 diastolic dysfunction -Despite stage 5 CKD, he does not appear to be volume overloaded at this time. -If he becomes volume overloaded, the likely treatment would be HD.  DM -Hemoglobin A1c 6.9 on 03/15/2019.  Hold Onglyza. Cover with sensitive-scale SSI.  Blood sugar fluctuating but acceptable so far.  HLD  -Continue Lipitor  Cocaine abuse -Patient denies use since  July, but was positive at the time of last admission in August. -UDS negative this time.  Code Status: DNR Family Communication: Plan of care discussed with patient.  He had no further questions.  He is going to talk to his family about further plan of care. Disposition Plan: remains inpatient    Consultants:  Surgery  GI  Cardiology  Vascular surgery  Procedures:  Colonoscopy on 03/16/2089  Antibiotics: Anti-infectives (From admission, onward)   Start     Dose/Rate Route Frequency Ordered Stop   03/16/19 0000  piperacillin-tazobactam (ZOSYN) IVPB 2.25 g     2.25 g 100 mL/hr over 30 Minutes Intravenous Every 8 hours 03/15/19 1527     03/15/19 1430  piperacillin-tazobactam (ZOSYN) IVPB 3.375 g     3.375 g 12.5 mL/hr over 240 Minutes Intravenous  Once 03/15/19 1423 03/15/19 1915       (indicate start date, and stop date if known)  HPI/Subjective: Patient seen and examined.  Continues to complain of abdominal pain 6 out of 10.  Does not have any other complaint.  He has decided to proceed with surgery after talking to his family.  Objective: Vitals:   03/18/19 1934 03/19/19 0606  BP: 131/68 127/68  Pulse: 66 (!) 55  Resp: 18 16  Temp: 98.5 F (36.9 C) 97.7 F (36.5 C)  SpO2: 98% 98%    Intake/Output Summary (Last 24 hours) at 03/19/2019 0829 Last data filed at 03/19/2019 0819 Gross per 24 hour  Intake 1307.93 ml  Output 525 ml  Net 782.93 ml   Filed Weights   03/18/19 0800  Weight: 64 kg    Exam:  General exam: Appears calm and comfortable  Respiratory system: Clear to auscultation. Respiratory effort normal. Cardiovascular system: S1 & S2 heard, RRR. No JVD, murmurs, rubs, gallops or clicks. No pedal edema. Gastrointestinal system: Abdomen is nondistended, firm and tender generalized. No organomegaly or masses felt. Normal bowel sounds heard. Central nervous system: Alert and oriented. No focal neurological deficits. Extremities: Symmetric 5 x 5  power. Skin: No rashes, lesions or ulcers.  Psychiatry: Judgement and insight appear poor. Mood & affect appropriate.   Data Reviewed: Basic Metabolic Panel: Recent Labs  Lab 03/15/19 1204 03/16/19 0459 03/17/19 0346 03/18/19 0418  NA 133* 136 138 140  K 5.1 5.0 4.4 4.0  CL 101 103 107 105  CO2 21* 21* 18* 20*  GLUCOSE 160* 189* 175* 165*  BUN 56* 55* 58* 50*  CREATININE 3.24* 3.38* 3.76* 3.56*  CALCIUM 9.3 9.2 9.2 9.2  PHOS  --   --   --  4.0   Liver Function Tests: Recent Labs  Lab 03/15/19 1204 03/18/19 0418  AST 16  --   ALT 10  --   ALKPHOS 85  --   BILITOT 0.5  --   PROT 7.9  --   ALBUMIN 2.5* 2.0*   Recent Labs  Lab 03/15/19 1204  LIPASE 51   No results for input(s): AMMONIA in the last 168 hours. CBC: Recent Labs  Lab 03/15/19 1154 03/16/19 0459 03/17/19 0346 03/18/19 0418  WBC 39.5* 43.2* 33.4* 31.2*  HGB 7.0* 6.5* 6.5* 8.1*  HCT 22.6* 19.8* 20.2* 24.6*  MCV 70.4* 67.8* 72.4* 76.4*  PLT 981* 746* 593* 623*   Cardiac Enzymes: No results for input(s): CKTOTAL, CKMB, CKMBINDEX, TROPONINI in the last 168 hours. BNP (last 3 results) Recent Labs    01/16/19 1820  BNP 1,036.2*    ProBNP (last 3 results) No results for input(s): PROBNP in the last 8760 hours.  CBG: Recent Labs  Lab 03/18/19 0749 03/18/19 1145 03/18/19 1705 03/18/19 1938 03/19/19 0821  GLUCAP 164* 198* 195* 166* 108*    Recent Results (from the past 240 hour(s))  SARS CORONAVIRUS 2 (TAT 6-24 HRS) Nasopharyngeal Nasopharyngeal Swab     Status: None   Collection Time: 03/10/19  9:32 AM   Specimen: Nasopharyngeal Swab  Result Value Ref Range Status   SARS Coronavirus 2 NEGATIVE NEGATIVE Final    Comment: (NOTE) SARS-CoV-2 target nucleic acids are NOT DETECTED. The SARS-CoV-2 RNA is generally detectable in upper and lower respiratory specimens during the acute phase of infection. Negative results do not preclude SARS-CoV-2 infection, do not rule out co-infections with  other pathogens, and should not be used as the sole basis for treatment or other patient management decisions. Negative results must be combined with clinical observations, patient history, and epidemiological information. The expected result is Negative. Fact Sheet for Patients: SugarRoll.be Fact Sheet for Healthcare Providers: https://www.woods-mathews.com/ This test is not yet approved or cleared by the [place] FDA and  has been authorized for detection and/or diagnosis of SARS-CoV-2 by FDA under an Emergency Use Authorization (EUA). This EUA will remain  in effect (meaning this test can be used) for the duration of the COVID-19 declaration under Section 56 4(b)(1) of the Act, 21 U.S.C. section 360bbb-3(b)(1), unless the authorization is terminated or revoked sooner. Performed at [facility], [place] [address], [place], [place] [address]   SARS CORONAVIRUS 2 (TAT 6-24 HRS) Nasopharyngeal Nasopharyngeal Swab     Status: None   Collection Time: 03/15/19  4:28 PM   Specimen: Nasopharyngeal Swab  Result Value Ref Range Status   SARS Coronavirus 2 NEGATIVE NEGATIVE Final    Comment: (NOTE) SARS-CoV-2 target nucleic acids are NOT DETECTED. The SARS-CoV-2 RNA is generally detectable in upper and lower respiratory specimens during the acute phase of infection. Negative results do not preclude SARS-CoV-2 infection, do not rule out co-infections with other pathogens, and should not be used as the sole basis for treatment or other patient management decisions. Negative results must be combined with clinical observations, patient history, and epidemiological information. The expected result is Negative. Fact Sheet for Patients: SugarRoll.be Fact Sheet for Healthcare Providers: https://www.woods-mathews.com/ This test is not yet approved or cleared by the [place] FDA and  has been authorized  for detection and/or diagnosis of SARS-CoV-2 by FDA under an Emergency Use Authorization (EUA). This EUA will remain  in effect (meaning this test can be used) for the duration of the COVID-19 declaration under Section 56 4(b)(1) of the Act, 21 U.S.C. section 360bbb-3(b)(1), unless the authorization is terminated or revoked sooner. Performed at [facility], [place] [address], [place],  [address]      Studies: Ir Cyndy Freeze Guide Cv Line Right  Result Date: 03/18/2019 INDICATION: 68 year old male referred for central line placement EXAM: IMAGE GUIDED PLACEMENT OF CENTRAL VENOUS CATHETER MEDICATIONS: NONE ANESTHESIA/SEDATION: NONE FLUOROSCOPY TIME:  Fluoroscopy Time: 0 minutes  12 seconds (1 mGy). COMPLICATIONS: NONE PROCEDURE: Informed written consent was obtained from the patient after a thorough discussion of the procedural risks, benefits and alternatives. All questions were addressed. Maximal Sterile Barrier Technique was utilized including caps, mask, sterile gowns, sterile gloves, sterile drape, hand hygiene and skin antiseptic. A timeout was performed prior to the initiation of the procedure. Patient was positioned supine position on fluoroscopy table. The right neck was prepped and draped in the usual sterile fashion. 1% lidocaine was used for local anesthesia. Using ultrasound guidance, micropuncture access was performed at the right IJ. Once we confirmed wire position, we measured and internal length to 2 vertebral bodies below the carina. The catheter was modified on the back table and then placed through the peel-away sheath. Final image was stored. Aspiration was confirmed at the catheter site. Catheter was sutured in position and a sterile bandage was placed. Patient tolerated the procedure well and remained hemodynamically stable throughout. No complications were encountered and no significant blood loss. IMPRESSION: Status post image guided placement of right IJ central venous  catheter. Signed, Dulcy Fanny. Dellia Nims, RPVI [facility] Radiology Electronically Signed   By: Corrie Mckusick D.O.   On: 03/18/2019 08:28   Ir US Guide Vasc Access Right  Result Date: 03/18/2019 INDICATION: 68 year old male referred for central line placement EXAM: IMAGE GUIDED PLACEMENT OF CENTRAL VENOUS CATHETER MEDICATIONS: NONE ANESTHESIA/SEDATION: NONE FLUOROSCOPY TIME:  Fluoroscopy Time: 0 minutes 12 seconds (1 mGy). COMPLICATIONS: NONE PROCEDURE: Informed written consent was obtained from the patient after a thorough discussion of the procedural risks, benefits and alternatives. All questions were addressed. Maximal Sterile Barrier Technique was utilized including caps, mask, sterile gowns, sterile gloves, sterile drape, hand hygiene and skin antiseptic. A timeout was performed prior to the initiation of the procedure. Patient was positioned supine position on fluoroscopy table. The right neck was prepped and draped in the usual sterile fashion. 1% lidocaine was used for local anesthesia. Using ultrasound guidance, micropuncture access was performed at the right IJ. Once we confirmed wire position, we measured and internal length to 2 vertebral bodies below the carina. The catheter was modified on the back table and then placed through the peel-away sheath. Final image was stored. Aspiration was confirmed at the catheter site. Catheter was sutured in position and a sterile bandage was placed. Patient tolerated the procedure well and remained hemodynamically stable throughout. No complications were encountered and no significant blood loss. IMPRESSION: Status post image guided placement of right IJ central venous catheter. Signed, Dulcy Fanny. Dellia Nims, RPVI [facility]iology Special[facility] Radiology Electronically Signed   By: Corrie Mckusick D.O.   On: 03/18/2019 08:28    Scheduled Meds: . sodium chloride   Intravenous Once  .  sodium chloride   Intravenous Once  . sodium chloride   Intravenous Once  . amLODipine  5 mg Oral Daily  . atorvastatin  20 mg Oral Daily  . carvedilol  25 mg Oral BID WC  . Chlorhexidine Gluconate Cloth  6 each Topical Daily  . docusate sodium  100 mg Oral BID  . feeding supplement  1 Container Oral TID BM  . folic acid  1 mg Oral Daily  . gabapentin  300 mg Oral TID  . insulin aspart  0-9 Units Subcutaneous TID WC  . metoCLOPramide (REGLAN) injection  10 mg Intravenous Once  . pantoprazole  40 mg Oral Daily  . sodium chloride flush  10-40 mL Intracatheter Q12H  Continuous Infusions: . piperacillin-tazobactam (ZOSYN)  IV 2.25 g (03/19/19 0027)    Principal Problem:   Colon cancer ([place]) Active Problems:   Hypertension   Type 2 diabetes mellitus with renal complication (HCC)   CKD (chronic kidney disease) stage 5, GFR less than 15 ml/min (HCC)   Anemia   Malignant neoplasm of cecum ([place])  Time spent: 27 minutes  [facility] Pager [phone]. If 7PM-7AM, please contact night-coverage at [email or web address], password [facility] 03/19/2019, 8:29 AM  LOS: 4 days

## 2019-03-19 NOTE — TOC Initial Note (Signed)
Transition of Care [facility]) - Initial/Assessment Note    Patient Details  Name: [name] MRN: [id number] Date of Birth: April 03, 1951  Transition of [facility]) CM/SW Contact:    [name], [place] Phone Number: 03/19/2019, 10:34 AM  Clinical Narrative:                 CSW met with pt at bedside. Introduced self, role, reason for visit. Confirmed home address, PCP. Pt lives alone; has assistance as needed from his ex-wife [name] and his daughter [name] as needed. He denies needs with transportation and obtaining medications. He uses a cane to get around his home. He is amenable to CSW setting up a f/u PCP appointment (confirmed phone # is still correct and active).   CSW will continue to follow as needed.   Expected Discharge Plan: Home/Self Care Barriers to Discharge: Continued Medical Work up, Other (comment)(Goals of Care)   Patient Goals and CMS Choice Patient states their goals for this hospitalization and ongoing recovery are:: to go home; to be informed about my care. CMS Medicare.gov Compare Post Acute Care list provided to:: (n/a) Choice offered to / list presented to : NA  Expected Discharge Plan and Services Expected Discharge Plan: Home/Self Care In-house Referral: Clinical Social Work Discharge Planning Services: CM Consult, Follow-up appt scheduled Post Acute Care Choice: Resumption of Svcs/PTA Provider Living arrangements for the past 2 months: Apartment  Prior Living Arrangements/Services Living arrangements for the past 2 months: Apartment Lives with:: Self Patient language and need for interpreter reviewed:: Yes(no needs) Do you feel safe going back to the place where you live?: Yes      Need for Family Participation in Patient Care: Yes (Comment)(assistance as needed/support) Care giver support system in place?: Yes (comment)(pt daughter and ex wife/friend) Current home services: DME Criminal Activity/Legal Involvement Pertinent to Current  Situation/Hospitalization: No - Comment as needed  Activities of Daily Living Home Assistive Devices/Equipment: Cane (specify quad or straight), Walker (specify type) ADL Screening (condition at time of admission) Patient's cognitive ability adequate to safely complete daily activities?: Yes Is the patient deaf or have difficulty hearing?: No Does the patient have difficulty seeing, even when wearing glasses/contacts?: No Does the patient have difficulty concentrating, remembering, or making decisions?: No Patient able to express need for assistance with ADLs?: Yes Does the patient have difficulty dressing or bathing?: No Independently performs ADLs?: Yes (appropriate for developmental age) Does the patient have difficulty walking or climbing stairs?: Yes Weakness of Legs: Both Weakness of Arms/Hands: None  Permission Sought/Granted Permission sought to share information with : Family Supports Permission granted to share information with : Yes, Verbal Permission Granted  Share Information with NAME: [name] [place]; [place] granted to share info w AGENCY: PCP  Permission granted to share info w Relationship: friend; daughter  Permission granted to share info w Contact Information: [phone]; [phone]  Emotional Assessment Appearance:: Appears stated age Attitude/Demeanor/Rapport: Engaged Affect (typically observed): Accepting, Adaptable, Appropriate Orientation: : Oriented to Self, Oriented to Place, Oriented to  Time, Oriented to Situation Alcohol / Substance Use: Not Applicable(hx of all three but not currently) Psych Involvement: No (comment)  Admission diagnosis:  Typhlitis [K37] End-stage renal disease on hemodialysis ([place]) [N18.6, Z99.2] Patient Active Problem List   Diagnosis Date Noted  . Malignant neoplasm of cecum ([place])   . Colon cancer ([place]) 03/15/2019  . Anemia 03/15/2019  . Congestive heart failure (CHF) ([place]) 01/17/2019  . Acute on  chronic diastolic CHF (congestive  heart failure) ([place]) 01/16/2019  . Hyponatremia 01/16/2019  . Sepsis ([place]) 02/08/2018  . Opiate overdose ([place]) 11/13/2017  . Acute urinary obstruction 03/29/2017  . Symptomatic anemia   . Hb-SS disease with crisis ([place]) 03/27/2017  . Sickle cell pain crisis ([place]) 01/23/2017  . Heart murmur 03/27/2016  . CKD (chronic kidney disease) stage 5, GFR less than 15 ml/min (HCC) 03/26/2016  . Cocaine abuse ([place]) 03/25/2016  . Renal failure (ARF), acute on chronic (HCC)   . Hb-SS disease without crisis ([place])   . Hyperkalemia, diminished renal excretion 09/03/2014  . Leukocytosis 07/28/2011  . LBBB (left bundle branch block) 07/28/2011  . Hypertension   . Type 2 diabetes mellitus with renal complication (HCC)    PCP:  Nolene Ebbs, MD Pharmacy:   [id number] [place]  [id number]: [phone]: [phone] [facility] [place]Phone: [phone]: [phone]    Social Determinants of Health (SDOH) Interventions    Readmission Risk Interventions Readmission Risk Prevention Plan 03/19/2019 01/18/2019  Transportation Screening Complete Complete  PCP or Specialist Appt within 3-5 Days - Complete  HRI or [place]- Complete  Social Work Consult for [place]anning/Counseling - Complete  Palliative Care Screening - Not Applicable  Medication Review (Press photographer Complete Complete  PCP or Specialist appointment within 3-5 days of discharge Complete -  [place] Home Care Consult Complete -  SW Recovery Care/Counseling Consult Complete -  Palliative Care Screening Not Applicable -  [place] -  Some recent data might be hidden

## 2019-03-19 NOTE — Telephone Encounter (Signed)
Spoke with Sharyn Dross in OR at [facility]. Patient's surgery with Dr. Donnetta Hutching to be moved to Co-ordinate with his surgery for colon resection in General surgeons OR room. Dr Early to do surgery for A/V graft first.

## 2019-03-19 NOTE — Progress Notes (Signed)
Patient ID: [name], male   DOB: [birth year], 68 y.o.   MRN: [id number] S: biopsy confirms poorly differentiated carcinoma.  He is aware and does want to have AVG placement and HD when needed, however he is not sure about colon surgery. O:BP 121/82 (BP Location: Right Arm)   Pulse 69   Temp 98 F (36.7 C) (Oral)   Resp 18   Ht 6\' 1"  (1.854 m)   Wt 64 kg   SpO2 98%   BMI 18.62 kg/m   Intake/Output Summary (Last 24 hours) at 03/19/2019 1504 Last data filed at 03/19/2019 0819 Gross per 24 hour  Intake 687.93 ml  Output 525 ml  Net 162.93 ml   Intake/Output: I/O last 3 completed shifts: In: 1307.9 [P.O.:1020; I.V.:210; IV Piggyback:77.9] Out: 375 [Urine:375]  Intake/Output this shift:  Total I/O In: -  Out: 300 [Urine:300] Weight change:  Gen: NAD CVS: no rub Resp: cta Abd: benign Ext: no edema  Recent Labs  Lab 03/15/19 1204 03/16/19 0459 03/17/19 0346 03/18/19 0418 03/19/19 1026  NA 133* 136 138 140 141  K 5.1 5.0 4.4 4.0 4.2  CL 101 103 107 105 109  CO2 21* 21* 18* 20* 21*  GLUCOSE 160* 189* 175* 165* 126*  BUN 56* 55* 58* 50* 42*  CREATININE 3.24* 3.38* 3.76* 3.56* 3.33*  ALBUMIN 2.5*  --   --  2.0*  --   CALCIUM 9.3 9.2 9.2 9.2 9.2  PHOS  --   --   --  4.0  --   AST 16  --   --   --   --   ALT 10  --   --   --   --    Liver Function Tests: Recent Labs  Lab 03/15/19 1204 03/18/19 0418  AST 16  --   ALT 10  --   ALKPHOS 85  --   BILITOT 0.5  --   PROT 7.9  --   ALBUMIN 2.5* 2.0*   Recent Labs  Lab 03/15/19 1204  LIPASE 51   No results for input(s): AMMONIA in the last 168 hours. CBC: Recent Labs  Lab 03/15/19 1154 03/16/19 0459 03/17/19 0346 03/18/19 0418 03/19/19 1026  WBC 39.5* 43.2* 33.4* 31.2* 24.5*  NEUTROABS  --   --   --   --  16.2*  HGB 7.0* 6.5* 6.5* 8.1* 8.5*  HCT 22.6* 19.8* 20.2* 24.6* 26.6*  MCV 70.4* 67.8* 72.4* 76.4* 77.3*  PLT 981* 746* 593* 623* 630*   Cardiac Enzymes: No results for input(s): CKTOTAL, CKMB,  CKMBINDEX, TROPONINI in the last 168 hours. CBG: Recent Labs  Lab 03/18/19 1145 03/18/19 1705 03/18/19 1938 03/19/19 0821 03/19/19 1252  GLUCAP 198* 195* 166* 108* 106*    Iron Studies: No results for input(s): IRON, TIBC, TRANSFERRIN, FERRITIN in the last 72 hours. Studies/Results: Ir Fluoro Guide Cv Line Right  Result Date: 03/18/2019 INDICATION: 68 year old male referred for central line placement EXAM: IMAGE GUIDED PLACEMENT OF CENTRAL VENOUS CATHETER MEDICATIONS: NONE ANESTHESIA/SEDATION: NONE FLUOROSCOPY TIME:  Fluoroscopy Time: 0 minutes 12 seconds (1 mGy). COMPLICATIONS: NONE PROCEDURE: Informed written consent was obtained from the patient after a thorough discussion of the procedural risks, benefits and alternatives. All questions were addressed. Maximal Sterile Barrier Technique was utilized including caps, mask, sterile gowns, sterile gloves, sterile drape, hand hygiene and skin antiseptic. A timeout was performed prior to the initiation of the procedure. Patient was positioned supine position on fluoroscopy table. The right neck was prepped and  draped in the usual sterile fashion. 1% lidocaine was used for local anesthesia. Using ultrasound guidance, micropuncture access was performed at the right IJ. Once we confirmed wire position, we measured and internal length to 2 vertebral bodies below the carina. The catheter was modified on the back table and then placed through the peel-away sheath. Final image was stored. Aspiration was confirmed at the catheter site. Catheter was sutured in position and a sterile bandage was placed. Patient tolerated the procedure well and remained hemodynamically stable throughout. No complications were encountered and no significant blood loss. IMPRESSION: Status post image guided placement of right IJ central venous catheter. Signed, Dulcy Fanny. Dellia Nims, RPVI [facility] Radiology Electronically Signed    By: Corrie Mckusick D.O.   On: 03/18/2019 08:28   Ir US Guide Vasc Access Right  Result Date: 03/18/2019 INDICATION: 68 year old male referred for central line placement EXAM: IMAGE GUIDED PLACEMENT OF CENTRAL VENOUS CATHETER MEDICATIONS: NONE ANESTHESIA/SEDATION: NONE FLUOROSCOPY TIME:  Fluoroscopy Time: 0 minutes 12 seconds (1 mGy). COMPLICATIONS: NONE PROCEDURE: Informed written consent was obtained from the patient after a thorough discussion of the procedural risks, benefits and alternatives. All questions were addressed. Maximal Sterile Barrier Technique was utilized including caps, mask, sterile gowns, sterile gloves, sterile drape, hand hygiene and skin antiseptic. A timeout was performed prior to the initiation of the procedure. Patient was positioned supine position on fluoroscopy table. The right neck was prepped and draped in the usual sterile fashion. 1% lidocaine was used for local anesthesia. Using ultrasound guidance, micropuncture access was performed at the right IJ. Once we confirmed wire position, we measured and internal length to 2 vertebral bodies below the carina. The catheter was modified on the back table and then placed through the peel-away sheath. Final image was stored. Aspiration was confirmed at the catheter site. Catheter was sutured in position and a sterile bandage was placed. Patient tolerated the procedure well and remained hemodynamically stable throughout. No complications were encountered and no significant blood loss. IMPRESSION: Status post image guided placement of right IJ central venous catheter. Signed, Dulcy Fanny. Dellia Nims, RPVI [facility] Radiology Electronically Signed   By: Corrie Mckusick D.O.   On: 03/18/2019 08:28   . sodium chloride   Intravenous Once  . sodium chloride   Intravenous Once  . sodium chloride   Intravenous Once  . amLODipine  5 mg Oral Daily  . atorvastatin  20 mg Oral Daily  . carvedilol  25  mg Oral BID WC  . Chlorhexidine Gluconate Cloth  6 each Topical Daily  . docusate sodium  100 mg Oral BID  . feeding supplement  1 Container Oral TID BM  . folic acid  1 mg Oral Daily  . gabapentin  300 mg Oral TID  . insulin aspart  0-9 Units Subcutaneous TID WC  . metoCLOPramide (REGLAN) injection  10 mg Intravenous Once  . pantoprazole  40 mg Oral Daily  . sodium chloride flush  10-40 mL Intracatheter Q12H    BMET    Component Value Date/Time   NA 141 03/19/2019 1026   K 4.2 03/19/2019 1026   CL 109 03/19/2019 1026   CO2 21 (L) 03/19/2019 1026   GLUCOSE 126 (H) 03/19/2019 1026   BUN 42 (H) 03/19/2019 1026   CREATININE 3.33 (H) 03/19/2019 1026   CALCIUM 9.2 03/19/2019 1026   GFRNONAA 18 (L) 03/19/2019 1026   GFRAA 21 (L) 03/19/2019 1026  CBC    Component Value Date/Time   WBC 24.5 (H) 03/19/2019 1026   RBC 3.44 (L) 03/19/2019 1026   HGB 8.5 (L) 03/19/2019 1026   HCT 26.6 (L) 03/19/2019 1026   HCT 14.3 (LL) 11/07/2016 1518   PLT 630 (H) 03/19/2019 1026   MCV 77.3 (L) 03/19/2019 1026   MCH 24.7 (L) 03/19/2019 1026   MCHC 32.0 03/19/2019 1026   RDW 26.7 (H) 03/19/2019 1026   LYMPHSABS 3.0 03/19/2019 1026   MONOABS 3.8 (H) 03/19/2019 1026   EOSABS 1.3 (H) 03/19/2019 1026   BASOSABS 0.1 03/19/2019 1026    Assessment/Plan:  1. 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 He had an avg placed 2 years ago but it has clotted and he has been referred for new access, however he declined surgery. Now with recurrent colon cancer. He still is considering dialysis if/when he needs it.  1. Have consulted [facility] for access placement which was supposed to happen months ago but pt put it off out of fear of covid but is amenable to proceed during this hospitalization.   2. No indication for HD at this time and Cr is at his baseline. 3. Need to have a goals of care meeting with he and his family especially if he is not a surgical candidate for his  cancer. 2. Recurrent colon cancer- per Dr. Carlean Purl, poor surgical cancer with grim prognosis.  Agree with palliative care consult.  Now DNR and surgery asking for cardiology for cardiac clearance. 3. Anemia- SCA and CKD. Transfuse prn. No ESA due to progressive cancer. 4. Chronic diastolic chf- stable 5. DM- per primary svc 6. Cocaine abuse- UDS negative this admission.  7. Disposition- pt currently DNR and will discuss possible transition to hospice pending colon cancer workup.  Donetta Potts, MD [employer] [phone]

## 2019-03-19 NOTE — Progress Notes (Signed)
[facility]/Trauma Progress Note  1 Day Post-Op   Assessment/Plan Hx of colon cancer s/p some type of colon resection in 1982 Hx of sickle cell anemia ESRD not on HD DM HTN HLD CHF Cocaine abuse Anemia-Hgb 6.5 this a.m. transfusion as needed per medicine DNR  RLQ abdominal pain and anemia -CT scan showedwall thickening in the cecum, now also involving terminal ileum, with progressive regional adenopathy, suggesting progression of colon carcinoma -colonoscopy 10/13 showed Malignant tumor in the ascending colon and in the cecum. path showed Poorly differentiated carcinoma - CEA 82.7 -suggest palliative care consult to discuss goals of care - Patient would like to proceed with both colon resection and HD cath - We will discuss with vascular surgery to see if we can do both procedures at the same time  FEN:CLD, do not advance past clears VTE: SCD's,chemical prophylaxis per medicine NU:UVOZD 10/10>>WBC 31.2, afebrile Foley:None Follow up:TBD   LOS: 4 days    Subjective: CC: RLQ abdominal pain  Patient states continued right lower quadrant abdominal pain.  He denies nausea or vomiting.  He is tolerating clears.  He is having flatus.  He would like to proceed with both colon resection and HD cath.   Objective: Vital signs in last 24 hours: Temp:  [97.7 F (36.5 C)-98.7 F (37.1 C)] 97.7 F (36.5 C) (10/14 0606) Pulse Rate:  [55-68] 55 (10/14 0606) Resp:  [16-18] 16 (10/14 0606) BP: (120-131)/(61-68) 127/68 (10/14 0606) SpO2:  [96 %-98 %] 98 % (10/14 0606) Last BM Date: 03/18/19  Intake/Output from previous day: 10/13 0701 - 10/14 0700 In: 1307.9 [P.O.:1020; I.V.:210; IV Piggyback:77.9] Out: 225 [Urine:225] Intake/Output this shift: Total I/O In: -  Out: 300 [Urine:300]  PE: Gen: Alert, NAD, pleasant, cooperative Pulm:Rate andeffort normal Abd: Soft,ND,TTP in RLQ with guarding. No peritonitis Skin: no rashes noted, warm and  dry   Anti-infectives: Anti-infectives (From admission, onward)   Start     Dose/Rate Route Frequency Ordered Stop   03/16/19 0000  piperacillin-tazobactam (ZOSYN) IVPB 2.25 g     2.25 g 100 mL/hr over 30 Minutes Intravenous Every 8 hours 03/15/19 1527     03/15/19 1430  piperacillin-tazobactam (ZOSYN) IVPB 3.375 g     3.375 g 12.5 mL/hr over 240 Minutes Intravenous  Once 03/15/19 1423 03/15/19 1915      Lab Results:  Recent Labs    03/17/19 0346 03/18/19 0418  WBC 33.4* 31.2*  HGB 6.5* 8.1*  HCT 20.2* 24.6*  PLT 593* 623*   BMET Recent Labs    03/17/19 0346 03/18/19 0418  NA 138 140  K 4.4 4.0  CL 107 105  CO2 18* 20*  GLUCOSE 175* 165*  BUN 58* 50*  CREATININE 3.76* 3.56*  CALCIUM 9.2 9.2   PT/INR No results for input(s): LABPROT, INR in the last 72 hours. CMP     Component Value Date/Time   NA 140 03/18/2019 0418   K 4.0 03/18/2019 0418   CL 105 03/18/2019 0418   CO2 20 (L) 03/18/2019 0418   GLUCOSE 165 (H) 03/18/2019 0418   BUN 50 (H) 03/18/2019 0418   CREATININE 3.56 (H) 03/18/2019 0418   CALCIUM 9.2 03/18/2019 0418   PROT 7.9 03/15/2019 1204   ALBUMIN 2.0 (L) 03/18/2019 0418   AST 16 03/15/2019 1204   ALT 10 03/15/2019 1204   ALKPHOS 85 03/15/2019 1204   BILITOT 0.5 03/15/2019 1204   GFRNONAA 16 (L) 03/18/2019 0418   GFRAA 19 (L) 03/18/2019 0418   Lipase  Component Value Date/Time   LIPASE 51 03/15/2019 1204    Studies/Results: Ir Fluoro Guide Cv Line Right  Result Date: 03/18/2019 INDICATION: 68 year old male referred for central line placement EXAM: IMAGE GUIDED PLACEMENT OF CENTRAL VENOUS CATHETER MEDICATIONS: NONE ANESTHESIA/SEDATION: NONE FLUOROSCOPY TIME:  Fluoroscopy Time: 0 minutes 12 seconds (1 mGy). COMPLICATIONS: NONE PROCEDURE: Informed written consent was obtained from the patient after a thorough discussion of the procedural risks, benefits and alternatives. All questions were addressed. Maximal Sterile Barrier Technique  was utilized including caps, mask, sterile gowns, sterile gloves, sterile drape, hand hygiene and skin antiseptic. A timeout was performed prior to the initiation of the procedure. Patient was positioned supine position on fluoroscopy table. The right neck was prepped and draped in the usual sterile fashion. 1% lidocaine was used for local anesthesia. Using ultrasound guidance, micropuncture access was performed at the right IJ. Once we confirmed wire position, we measured and internal length to 2 vertebral bodies below the carina. The catheter was modified on the back table and then placed through the peel-away sheath. Final image was stored. Aspiration was confirmed at the catheter site. Catheter was sutured in position and a sterile bandage was placed. Patient tolerated the procedure well and remained hemodynamically stable throughout. No complications were encountered and no significant blood loss. IMPRESSION: Status post image guided placement of right IJ central venous catheter. Signed, Dulcy Fanny. Dellia Nims, RPVI [facility] Interventional Radiology Specialists [facility] Radiology Electronically Signed   By: Corrie Mckusick D.O.   On: 03/18/2019 08:28   Ir US Guide Vasc Access Right  Result Date: 03/18/2019 INDICATION: 68 year old male referred for central line placement EXAM: IMAGE GUIDED PLACEMENT OF CENTRAL VENOUS CATHETER MEDICATIONS: NONE ANESTHESIA/SEDATION: NONE FLUOROSCOPY TIME:  Fluoroscopy Time: 0 minutes 12 seconds (1 mGy). COMPLICATIONS: NONE PROCEDURE: Informed written consent was obtained from the patient after a thorough discussion of the procedural risks, benefits and alternatives. All questions were addressed. Maximal Sterile Barrier Technique was utilized including caps, mask, sterile gowns, sterile gloves, sterile drape, hand hygiene and skin antiseptic. A timeout was performed prior to the initiation of the procedure. Patient was positioned supine position on fluoroscopy table. The  right neck was prepped and draped in the usual sterile fashion. 1% lidocaine was used for local anesthesia. Using ultrasound guidance, micropuncture access was performed at the right IJ. Once we confirmed wire position, we measured and internal length to 2 vertebral bodies below the carina. The catheter was modified on the back table and then placed through the peel-away sheath. Final image was stored. Aspiration was confirmed at the catheter site. Catheter was sutured in position and a sterile bandage was placed. Patient tolerated the procedure well and remained hemodynamically stable throughout. No complications were encountered and no significant blood loss. IMPRESSION: Status post image guided placement of right IJ central venous catheter. Signed, Dulcy Fanny. Dellia Nims, RPVI [facility] Inter[facility] Radiology Specialists [facility] Radiology Electronically Signed   By: Corrie Mckusick D.O.   On: 03/18/2019 08:28     Kalman Drape, [facility] Surgery Pager [phone] Cristine Polio, & Friday 7:00am - 4:30pm Thursdays 7:00am -11:30am

## 2019-03-19 NOTE — Progress Notes (Signed)
Hospital patient No recall or letter needed

## 2019-03-19 NOTE — Consult Note (Signed)
Consultation Note Date: 03/19/2019   Patient Name: [name]  DOB: March 23, 1951  MRN: [id number]  Age / Sex: 68 y.o., male   PCP: Nolene Ebbs, MD Referring Physician: Darliss Cheney, MD   REASON FOR CONSULTATION:Establishing goals of care  Palliative Care consult requested for this 68 y.o. male with multiple medical problems including sickle cell anemia (SS disease); ESRDnotyet on HD; hypertension, hyperlipidemia, diabetes, colon CA 20-25 years ago, and substance abuse (cocaine-last known positive UDS 01/16/19). Patient is s/p  vocal cord lesion removed with CO2 laser excision during microlaryngoscopy on 03/12/19. He presented to ED with complaints of nausea which he reported started the day he went home after the procedure on 10/7. He required 4 units of blood prior to procedure. Since admission patient has been seen by GI. Abdominal CT showed progression of colon cancer at cecum and ascending colon. He underwent a colonoscopy on 03/18/19 with biopsy. Pathology resulted poorly differentiated carcinoma. Palliative Medicine team consulted for goals of care.   Clinical Assessment and Goals of Care: I have reviewed medical records including lab results, imaging, Epic notes, and MAR, received report from the bedside RN, and assessed the patient. I met at the bedside with patient to discuss diagnosis prognosis, GOC, EOL wishes, disposition and options. [name] is awake, alert & oriented x4. He denies pain or shortness of breath. Does endorse intermittent abdominal pain. No family is at the bedside. I offered to include other family in discussion however, he declined he expressed he has spoken with them and they are aware of everything that is going on. He can provided any updates as needed.   I introduced Palliative Medicine as specialized medical care for people living with serious illness. It focuses on providing relief from the symptoms and stress of a serious illness. The goal is to  improve quality of life for both the patient and the family.  We discussed a brief life review of the patient, along with his functional and nutritional status. 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 He reports he has been married 3 times and is still closely connected to his ex-wife [name]. He has 5 children who are supportive. He is of [place] faith.   Prior to admission he reports he was able to perform all ADLs independently. He lives alone and his ex-wife would come over daily or he would go over to her and offer assistance. He is tearful expressing she is also battling metastatic lung cancer. She has recently been diagnosed and preparing to start chemotherapy. He reports a decrease in his appetite with a weight loss of proximately 40lb over the past 3-4 months.   We discussed his lifestyle. He reports he drinks alcohol socially but have not had a beer in several months. He does endorse the use of cocaine at times stating "every now and again I dip and dab with it but I don't use it regularly" He shares he had an addiction over 3 years ago and with the assistance of rehab he became clean. He states he is not that bad as before as he can go months without use. He does endorse last using in August early September. I educate patient given the severity of his illness the importance of discontinuing any further use. He verbalized understanding.    We discussed His current illness and what it means in the larger context of His on-going co-morbidities. With specific discussions regarding his recurrent colon cancer  and renal failure. Natural disease trajectory and expectations at EOL were discussed. Avett reports a clear understanding of his illness.   He states "I have discussed with my children and ex-wife. I want to keep fighting and have a chance, but if it doesn't look good after that I will be at peace and ready to see the good Lord!"   I attempted to elicit values and  goals of care important to the patient.    The difference between aggressive medical intervention and comfort care was considered in light of the patient's goals of care. [name] reports he has decided that he would like to undergo all aggressive medical interventions including surgery and HD. He shares if he does not do well with recovery from surgery or declines he would not want to have a poor quality of life and would then be more open to a more comfort approach. He continues to state "I will be ok either way, but I won't go down without a fight for the sake of my children!"   Space and opportunity to further elaborate on his statement. We discussed his expressed wishes. He shared if he was not able to walk on his own, perform some self-care, required total assistance, could not eat, or if his cancer had spread all over he would not be interested in aggressive interventions and at that point he would then wish to be comfortable and just be with his family.   He remains hopeful for a successful surgery. He understands it is a risk and states if he was to pass away during surgery again at least he was trying to fight and would be ok with that. We discussed HD and treatments. He reports he is not happy about having to start but knows this is also the only option he has to give him a fighting chance.   [name] confirms wishes for DNR/DNI. He states he would also not want any artificial feedings long-term such as a PEG. He reports he does not have a documented Advanced directives, however his son, Alfonzo Feller would be his designated Todd planner given [name] is battling with her own health. I offered to have spiritual support to come and assist with completion of an AD however, he declined and stated his family are all aware of his wishes and that his son is the lead when it comes down to it.  Hospice and Palliative Care services outpatient were explained and offered. Patient verbalized his  understanding and awareness of both palliative and hospice's goals and philosophy of care. At this time he is open to outpatient palliative support once discharged, unless his condition changes and then it would be a more hospice approach.   Questions and concerns were addressed. The family was encouraged to call with questions or concerns.  PMT will continue to support holistically.   SOCIAL HISTORY:     reports that he quit smoking about 52 years ago. His smoking use included cigarettes. He has a 10.00 pack-year smoking history. He has never used smokeless tobacco. He reports current alcohol use. He reports previous drug use. Drugs: Cocaine and "Crack" cocaine.  CODE STATUS: DNR  ADVANCE DIRECTIVES: Alfonzo Feller (son) per patient   SYMPTOM MANAGEMENT: per attending   Palliative Prophylaxis:   Aspiration, Bowel Regimen, Frequent Pain Assessment and Oral Care  PSYCHO-SOCIAL/SPIRITUAL:  Support System: family   Desire for further Chaplaincy support:NO   Additional Recommendations (Limitations, Scope, Preferences):  Full Scope Treatment, No Artificial Feeding and  No Tracheostomy   PAST MEDICAL HISTORY: Past Medical History:  Diagnosis Date   Arthritis    Cancer ([place])    colon   Cataracts, bilateral    surgery to remove   Diabetes mellitus    type 2   GERD (gastroesophageal reflux disease)    Heart murmur    History of kidney stones    passed stone, no surgery   Hyperlipidemia    Hypertension    Lesion of vocal cord    Pneumonia 2019   x 2   Renal insufficiency    no dialysis - stagte 5   Sickle cell anemia ([facility])    Hgb SS disease    Wears glasses     PAST SURGICAL HISTORY:  Past Surgical History:  Procedure Laterality Date   AV FISTULA PLACEMENT Left 11/22/2016   Procedure: INSERTION OF ARTERIOVENOUS (AV) GORE-TEX GRAFT  LEFT FOREARM USING 6MM X 50CM GORETEX GRAFT;  Surgeon: Serafina Mitchell, MD;  Location: [place];  Service: Vascular;   Laterality: Left;   BIOPSY  03/18/2019   Procedure: BIOPSY;  Surgeon: Gatha Mayer, MD;  Location: [place];  Service: Endoscopy;;   CATARACT EXTRACTION W/ INTRAOCULAR LENS  IMPLANT, BILATERAL     CHOLECYSTECTOMY     CO2 LASER APPLICATION N/A 66/09/4032   Procedure: CO2 LASER EXCISION OF VOCAL FOLD LESION;  Surgeon: Melida Quitter, MD;  [facility] OR;  Service: ENT;  Laterality: N/A;   COLON SURGERY     Colon cancer surgery, removed small amt not full colectomy   COLONOSCOPY     nclear who follows him for this   COLONOSCOPY WITH PROPOFOL N/A 03/18/2019   Procedure: COLONOSCOPY WITH PROPOFOL;  Surgeon: Gatha Mayer, MD;  [facility] ENDOSCOPY;  Service: Endoscopy;  Laterality: N/A;   DIRECT LARYNGOSCOPY N/A 03/12/2019   Procedure: SUSPENDED MICRO DIRECT LARYNGOSCOPY;  Surgeon: Melida Quitter, MD;  Location: [place];  Service: ENT;  Laterality: N/A;   IR FLUORO GUIDE CV LINE RIGHT  03/17/2019   IR US GUIDE VASC ACCESS RIGHT  03/17/2019   LEG SURGERY     For ? gangrene after running into barbwire fence at 68yo   MULTIPLE TOOTH EXTRACTIONS     no dentures    ALLERGIES:  is allergic to morphine and related.   MEDICATIONS:  Current Facility-Administered Medications  Medication Dose Route Frequency Provider Last Rate Last Dose   0.9 %  sodium chloride infusion (Manually program via Guardrails IV Fluids)   Intravenous Once Gatha Mayer, MD       0.9 %  sodium chloride infusion (Manually program via Guardrails IV Fluids)   Intravenous Once Gatha Mayer, MD       0.9 %  sodium chloride infusion (Manually program via Guardrails IV Fluids)   Intravenous Once Gatha Mayer, MD       acetaminophen (TYLENOL) tablet 650 mg  650 mg Oral Q6H PRN Gatha Mayer, MD   650 mg at 03/19/19 0236   Or   acetaminophen (TYLENOL) suppository 650 mg  650 mg Rectal Q6H PRN Gatha Mayer, MD       albuterol (PROVENTIL) (2.5 MG/3ML) 0.083% nebulizer solution 2.5 mg  2.5 mg  Inhalation Q6H PRN Gatha Mayer, MD       amLODipine (NORVASC) tablet 5 mg  5 mg Oral Daily Gatha Mayer, MD   5 mg at 03/19/19 1021   atorvastatin (LIPITOR) tablet 20 mg  20 mg Oral Daily  Gatha Mayer, MD   20 mg at 03/19/19 1021   calcium carbonate (dosed in mg elemental calcium) suspension 500 mg of elemental calcium  500 mg of elemental calcium Oral Q6H PRN Gatha Mayer, MD       camphor-menthol [facility]) lotion 1 application  1 application Topical Y7W PRN Gatha Mayer, MD       And   hydrOXYzine (ATARAX/VISTARIL) tablet 25 mg  25 mg Oral Q8H PRN Gatha Mayer, MD       carvedilol (COREG) tablet 25 mg  25 mg Oral BID WC Gatha Mayer, MD   25 mg at 03/19/19 2956   Chlorhexidine Gluconate Cloth 2 % PADS 6 each  6 each Topical Daily Gatha Mayer, MD   6 each at 03/18/19 1218   docusate sodium (COLACE) capsule 100 mg  100 mg Oral BID Gatha Mayer, MD   100 mg at 03/19/19 1022   docusate sodium (ENEMEEZ) enema 283 mg  1 enema Rectal PRN Gatha Mayer, MD       feeding supplement (BOOST / RESOURCE BREEZE) liquid 1 Container  1 Container Oral TID BM Gatha Mayer, MD   1 Container at 21/30/86 5784   folic acid (FOLVITE) tablet 1 mg  1 mg Oral Daily Gatha Mayer, MD   1 mg at 03/19/19 1022   gabapentin (NEURONTIN) capsule 300 mg  300 mg Oral TID Gatha Mayer, MD   300 mg at 03/19/19 1021   HYDROmorphone (DILAUDID) injection 1 mg  1 mg Intravenous Q4H PRN Gatha Mayer, MD   1 mg at 03/19/19 1118   HYDROmorphone (DILAUDID) tablet 1 mg  1 mg Oral Q3H PRN Gatha Mayer, MD   1 mg at 03/19/19 0830   insulin aspart (novoLOG) injection 0-9 Units  0-9 Units Subcutaneous TID WC Gatha Mayer, MD   2 Units at 03/18/19 1743   lidocaine (XYLOCAINE) 1 % (with pres) injection    PRN Corrie Mckusick, DO   5 mL at 03/17/19 2005   metoCLOPramide (REGLAN) injection 10 mg  10 mg Intravenous Once Gatha Mayer, MD       ondansetron [facility]) tablet 4 mg  4 mg Oral  Q6H PRN Gatha Mayer, MD       Or   ondansetron [facility]) injection 4 mg  4 mg Intravenous Q6H PRN Gatha Mayer, MD   4 mg at 03/15/19 2143   pantoprazole (PROTONIX) EC tablet 40 mg  40 mg Oral Daily Gatha Mayer, MD   40 mg at 03/19/19 1021   piperacillin-tazobactam (ZOSYN) IVPB 2.25 g  2.25 g Intravenous Q8H Gatha Mayer, MD 100 mL/hr at 03/19/19 0832 2.25 g at 03/19/19 0832   sodium chloride flush (NS) 0.9 % injection 10-40 mL  10-40 mL Intracatheter Q12H Gatha Mayer, MD   10 mL at 03/18/19 2129   sodium chloride flush (NS) 0.9 % injection 10-40 mL  10-40 mL Intracatheter PRN Gatha Mayer, MD       sorbitol 70 % solution 30 mL  30 mL Oral PRN Gatha Mayer, MD       zolpidem (AMBIEN) tablet 5 mg  5 mg Oral QHS PRN Gatha Mayer, MD        VITAL SIGNS: BP 127/68 (BP Location: Right Arm)    Pulse (!) 55    Temp 97.7 F (36.5 C) (Oral)    Resp 16  Ht '6\' 1"'  (1.854 m)    Wt 64 kg    SpO2 98%    BMI 18.62 kg/m  Filed Weights   03/18/19 0800  Weight: 64 kg    Estimated body mass index is 18.62 kg/m as calculated from the following:   Height as of this encounter: '6\' 1"'  (1.854 m).   Weight as of this encounter: 64 kg.  LABS: CBC:    Component Value Date/Time   WBC 24.5 (H) 03/19/2019 1026   HGB 8.5 (L) 03/19/2019 1026   HCT 26.6 (L) 03/19/2019 1026   HCT 14.3 (LL) 11/07/2016 1518   PLT 630 (H) 03/19/2019 1026   Comprehensive Metabolic Panel:    Component Value Date/Time   NA 141 03/19/2019 1026   K 4.2 03/19/2019 1026   CO2 21 (L) 03/19/2019 1026   BUN 42 (H) 03/19/2019 1026   CREATININE 3.33 (H) 03/19/2019 1026   ALBUMIN 2.0 (L) 03/18/2019 0418     Review of Systems  Constitutional: Positive for fatigue.  Gastrointestinal: Positive for abdominal pain.  Unless otherwise noted, a complete review of systems is negative.  Physical Exam General: NAD, pleasant, thin Cardiovascular: regular rate and rhythm Pulmonary: clear ant fields Abdomen:  soft, tender RLQ, + bowel sounds Extremities: no edema, no joint deformities Skin: no rashes Neurological: A&O x3, mood appropriate    Prognosis: Unable to determine (Guareded to Poor) in the setting of recurrent poorly differentiated adenocarcinoma of colon per biopsy, deconditioning, sickle cell anemia, ESRD preparation to start HD, diabetes, hypertension, CHF, anemia, cocaine abuse, colon cancer s/p resection 1982.   Discharge Planning:  To Be Determined  Recommendations:  DNR/DNI-as confirmed by patient  Patient states his son [name] is contact person/decison maker as his ex-wife is battling metastatic lung cancer.   Expressed goals are clear and set. He wishes to proceed with aggressive medical interventions (surgical, HD, and any other recommended treatments including chemotherapy). States if he continues to decline s/p interventions would not wish to suffer and would then be open to a more comfort hospice approach.  OP palliative support at discharge at minimal.   PMT will continue to support and follow.   Palliative Performance Scale: PPS 50%                Patient expressed understanding and was in agreement with this plan.   Thank you for allowing the Palliative Medicine Team to assist in the care of this patient.  Time In: 1140 Time Out: 1245 Time Total: 65 min.   Visit consisted of counseling and education dealing with the complex and emotionally intense issues of symptom management and palliative care in the setting of serious and potentially life-threatening illness.Greater than 50%  of this time was spent counseling and coordinating care related to the above assessment and plan.  Signed by:  Alda Lea, AGPCNP-BC Palliative Medicine Team  Phone: [phone] Fax: [phone] Pager: [phone] Amion: Bjorn Pippin

## 2019-03-20 ENCOUNTER — Inpatient Hospital Stay (HOSPITAL_COMMUNITY): Payer: Medicare HMO | Admitting: Certified Registered Nurse Anesthetist

## 2019-03-20 ENCOUNTER — Encounter (HOSPITAL_COMMUNITY): Payer: Self-pay | Admitting: Certified Registered Nurse Anesthetist

## 2019-03-20 ENCOUNTER — Encounter (HOSPITAL_COMMUNITY)
Admission: EM | Disposition: A | Discharge status: Home / Self Care | Payer: Self-pay | Source: Home / Self Care | Attending: Family Medicine

## 2019-03-20 DIAGNOSIS — N184 Chronic kidney disease, stage 4 (severe): Secondary | ICD-10-CM

## 2019-03-20 DIAGNOSIS — C182 Malignant neoplasm of ascending colon: Secondary | ICD-10-CM | POA: Diagnosis not present

## 2019-03-20 HISTORY — PX: AV FISTULA PLACEMENT: SHX1204

## 2019-03-20 LAB — CBC WITH DIFFERENTIAL/PLATELET
Abs Immature Granulocytes: 0.13 10*3/uL — ABNORMAL HIGH (ref 0.00–0.07)
Basophils Absolute: 0.2 10*3/uL — ABNORMAL HIGH (ref 0.0–0.1)
Basophils Relative: 1 %
Eosinophils Absolute: 1.4 10*3/uL — ABNORMAL HIGH (ref 0.0–0.5)
Eosinophils Relative: 7 %
HCT: 26 % — ABNORMAL LOW (ref 39.0–52.0)
Hemoglobin: 8.6 g/dL — ABNORMAL LOW (ref 13.0–17.0)
Immature Granulocytes: 1 %
Lymphocytes Relative: 13 %
Lymphs Abs: 2.6 10*3/uL (ref 0.7–4.0)
MCH: 25.4 pg — ABNORMAL LOW (ref 26.0–34.0)
MCHC: 33.1 g/dL (ref 30.0–36.0)
MCV: 76.7 fL — ABNORMAL LOW (ref 80.0–100.0)
Monocytes Absolute: 3.2 10*3/uL — ABNORMAL HIGH (ref 0.1–1.0)
Monocytes Relative: 16 %
Neutro Abs: 12.8 10*3/uL — ABNORMAL HIGH (ref 1.7–7.7)
Neutrophils Relative %: 62 %
Platelets: 578 10*3/uL — ABNORMAL HIGH (ref 150–400)
RBC: 3.39 MIL/uL — ABNORMAL LOW (ref 4.22–5.81)
RDW: 26.5 % — ABNORMAL HIGH (ref 11.5–15.5)
WBC: 20.2 10*3/uL — ABNORMAL HIGH (ref 4.0–10.5)
nRBC: 0.2 % (ref 0.0–0.2)

## 2019-03-20 LAB — GLUCOSE, CAPILLARY
Glucose-Capillary: 125 mg/dL — ABNORMAL HIGH (ref 70–99)
Glucose-Capillary: 121 mg/dL — ABNORMAL HIGH (ref 70–99)
Glucose-Capillary: 93 mg/dL (ref 70–99)
Glucose-Capillary: 183 mg/dL — ABNORMAL HIGH (ref 70–99)

## 2019-03-20 LAB — BASIC METABOLIC PANEL
Anion gap: 12 (ref 5–15)
BUN: 39 mg/dL — ABNORMAL HIGH (ref 8–23)
CO2: 20 mmol/L — ABNORMAL LOW (ref 22–32)
Calcium: 9.1 mg/dL (ref 8.9–10.3)
Chloride: 107 mmol/L (ref 98–111)
Creatinine, Ser: 3.08 mg/dL — ABNORMAL HIGH (ref 0.61–1.24)
GFR calc Af Amer: 23 mL/min — ABNORMAL LOW (ref >60)
GFR calc non Af Amer: 19 mL/min — ABNORMAL LOW (ref >60)
Glucose, Bld: 143 mg/dL — ABNORMAL HIGH (ref 70–99)
Potassium: 4.3 mmol/L (ref 3.5–5.1)
Sodium: 139 mmol/L (ref 135–145)

## 2019-03-20 LAB — SURGICAL PCR SCREEN
MRSA, PCR: NEGATIVE
Staphylococcus aureus: POSITIVE — AB

## 2019-03-20 SURGERY — INSERTION OF ARTERIOVENOUS (AV) GORE-TEX GRAFT ARM
Anesthesia: Monitor Anesthesia Care | Site: Arm Upper | Laterality: Left

## 2019-03-20 MED ORDER — LIDOCAINE-EPINEPHRINE 0.5 %-1:200000 IJ SOLN
INTRAMUSCULAR | Status: AC
  Filled 2019-03-20: qty 1

## 2019-03-20 MED ORDER — LIDOCAINE-EPINEPHRINE 0.5 %-1:200000 IJ SOLN
INTRAMUSCULAR | Status: DC | PRN
  Administered 2019-03-20: 14.5 mL

## 2019-03-20 MED ORDER — FENTANYL CITRATE (PF) 100 MCG/2ML IJ SOLN
INTRAMUSCULAR | Status: DC | PRN
  Administered 2019-03-20: 50 ug via INTRAVENOUS

## 2019-03-20 MED ORDER — SODIUM CHLORIDE 0.9 % IV SOLN
INTRAVENOUS | Status: DC | PRN
  Administered 2019-03-20: 500 mL

## 2019-03-20 MED ORDER — PROPOFOL 10 MG/ML IV BOLUS
INTRAVENOUS | Status: DC | PRN
  Administered 2019-03-20: 20 mg via INTRAVENOUS

## 2019-03-20 MED ORDER — ONDANSETRON HCL 4 MG/2ML IJ SOLN
INTRAMUSCULAR | Status: AC
  Filled 2019-03-20: qty 2

## 2019-03-20 MED ORDER — FENTANYL CITRATE (PF) 100 MCG/2ML IJ SOLN: 25.0000 ug | INTRAMUSCULAR | Status: DC | PRN

## 2019-03-20 MED ORDER — 0.9 % SODIUM CHLORIDE (POUR BTL) OPTIME
TOPICAL | Status: DC | PRN
  Administered 2019-03-20: 08:00:00 1000 mL

## 2019-03-20 MED ORDER — MIDAZOLAM HCL 2 MG/2ML IJ SOLN
INTRAMUSCULAR | Status: AC
  Filled 2019-03-20: qty 2

## 2019-03-20 MED ORDER — LIDOCAINE-EPINEPHRINE (PF) 1 %-1:200000 IJ SOLN
INTRAMUSCULAR | Status: AC
  Filled 2019-03-20: qty 30

## 2019-03-20 MED ORDER — PROPOFOL 500 MG/50ML IV EMUL
INTRAVENOUS | Status: DC | PRN
  Administered 2019-03-20: 100 ug/kg/min via INTRAVENOUS

## 2019-03-20 MED ORDER — FENTANYL CITRATE (PF) 250 MCG/5ML IJ SOLN
INTRAMUSCULAR | Status: AC
  Filled 2019-03-20: qty 5

## 2019-03-20 MED ORDER — SODIUM CHLORIDE 0.9 % IV SOLN
INTRAVENOUS | Status: AC
  Filled 2019-03-20: qty 1.2

## 2019-03-20 MED ORDER — MIDAZOLAM HCL 5 MG/5ML IJ SOLN
INTRAMUSCULAR | Status: DC | PRN
  Administered 2019-03-20: 1 mg via INTRAVENOUS

## 2019-03-20 MED ORDER — SODIUM CHLORIDE 0.9 % IV SOLN
INTRAVENOUS | Status: DC | PRN
  Administered 2019-03-20 (×2): via INTRAVENOUS

## 2019-03-20 MED ORDER — ONDANSETRON HCL 4 MG/2ML IJ SOLN
INTRAMUSCULAR | Status: DC | PRN
  Administered 2019-03-20: 4 mg via INTRAVENOUS

## 2019-03-20 MED ORDER — MUPIROCIN 2 % EX OINT
1.0000 "application " | TOPICAL_OINTMENT | Freq: Two times a day (BID) | CUTANEOUS | Status: DC
  Administered 2019-03-20 – 2019-04-03 (×26): 1 via TOPICAL
  Filled 2019-03-20: qty 22

## 2019-03-20 MED ORDER — PROPOFOL 10 MG/ML IV BOLUS
INTRAVENOUS | Status: AC
  Filled 2019-03-20: qty 20

## 2019-03-20 MED ORDER — HEPARIN SODIUM (PORCINE) 1000 UNIT/ML IJ SOLN
INTRAMUSCULAR | Status: AC
  Filled 2019-03-20: qty 1

## 2019-03-20 SURGICAL SUPPLY — 34 items
ARMBAND PINK RESTRICT EXTREMIT (MISCELLANEOUS) ×4 IMPLANT
CANISTER SUCT 3000ML PPV (MISCELLANEOUS) ×2 IMPLANT
CANNULA VESSEL 3MM 2 BLNT TIP (CANNULA) ×2 IMPLANT
CLIP LIGATING EXTRA MED SLVR (CLIP) ×2 IMPLANT
CLIP LIGATING EXTRA SM BLUE (MISCELLANEOUS) ×2 IMPLANT
COVER WAND RF STERILE (DRAPES) ×1 IMPLANT
DECANTER SPIKE VIAL GLASS SM (MISCELLANEOUS) ×3 IMPLANT
DERMABOND ADVANCED (GAUZE/BANDAGES/DRESSINGS) ×1
DERMABOND ADVANCED .7 DNX12 (GAUZE/BANDAGES/DRESSINGS) ×1 IMPLANT
ELECT REM PT RETURN 9FT ADLT (ELECTROSURGICAL) ×2
ELECTRODE REM PT RTRN 9FT ADLT (ELECTROSURGICAL) ×1 IMPLANT
GAUZE SPONGE 4X4 12PLY STRL (GAUZE/BANDAGES/DRESSINGS) ×2 IMPLANT
GLOVE BIO SURGEON STRL SZ 6.5 (GLOVE) ×2 IMPLANT
GLOVE BIOGEL PI IND STRL 6.5 (GLOVE) IMPLANT
GLOVE BIOGEL PI INDICATOR 6.5 (GLOVE) ×3
GLOVE SS BIOGEL STRL SZ 7.5 (GLOVE) ×1 IMPLANT
GLOVE SUPERSENSE BIOGEL SZ 7.5 (GLOVE) ×1
GLOVE SURG SS PI 6.0 STRL IVOR (GLOVE) ×2 IMPLANT
GOWN STRL REUS W/ TWL LRG LVL3 (GOWN DISPOSABLE) ×3 IMPLANT
GOWN STRL REUS W/TWL LRG LVL3 (GOWN DISPOSABLE) ×3
GRAFT GORETEX STRT 4-7X45 (Vascular Products) ×1 IMPLANT
KIT BASIN OR (CUSTOM PROCEDURE TRAY) ×2 IMPLANT
KIT TURNOVER KIT B (KITS) ×2 IMPLANT
NS IRRIG 1000ML POUR BTL (IV SOLUTION) ×2 IMPLANT
PACK CV ACCESS (CUSTOM PROCEDURE TRAY) ×2 IMPLANT
PAD ARMBOARD 7.5X6 YLW CONV (MISCELLANEOUS) ×4 IMPLANT
SUT PROLENE 6 0 CC (SUTURE) ×5 IMPLANT
SUT SILK 2 0 PERMA HAND 18 BK (SUTURE) IMPLANT
SUT VIC AB 3-0 SH 27 (SUTURE) ×2
SUT VIC AB 3-0 SH 27X BRD (SUTURE) ×2 IMPLANT
SYR TOOMEY 50ML (SYRINGE) IMPLANT
TOWEL GREEN STERILE (TOWEL DISPOSABLE) ×2 IMPLANT
UNDERPAD 30X30 (UNDERPADS AND DIAPERS) ×2 IMPLANT
WATER STERILE IRR 1000ML POUR (IV SOLUTION) ×2 IMPLANT

## 2019-03-20 NOTE — Transfer of Care (Signed)
Immediate Anesthesia Transfer of Care Note  Patient: [name]  Procedure(s) Performed: INSERTION OF ARTERIOVENOUS (AV) GORE-TEX GRAFT LEFT UPPER ARM (Left Arm Upper)  Patient Location: PACU  Anesthesia Type:MAC  Level of Consciousness: drowsy  Airway & Oxygen Therapy: Patient Spontanous Breathing and Patient connected to face mask oxygen  Post-op Assessment: Report given to RN and Post -op Vital signs reviewed and stable  Post vital signs: Reviewed and stable  Last Vitals:  Vitals Value Taken Time  BP 118/67 03/20/19 0857  Temp    Pulse 60 03/20/19 0859  Resp 11 03/20/19 0859  SpO2 100 % 03/20/19 0859  Vitals shown include unvalidated device data.  Last Pain:  Vitals:   03/20/19 0507  TempSrc:   PainSc: Asleep      Patients Stated Pain Goal: 3 (07/62/26 3335)  Complications: No apparent anesthesia complications

## 2019-03-20 NOTE — Progress Notes (Signed)
Patient ID: [name], male   DOB: [birth year], 68 y.o.   MRN: [id number] S: Feels well and tolerated LUE AVG placement without issues today O:BP 133/68 (BP Location: Right Arm)   Pulse 61   Temp 97.9 F (36.6 C) (Oral)   Resp 18   Ht 6\' 1"  (1.854 m)   Wt 64 kg   SpO2 96%   BMI 18.62 kg/m   Intake/Output Summary (Last 24 hours) at 03/20/2019 1209 Last data filed at 03/20/2019 0841 Gross per 24 hour  Intake 790 ml  Output 115 ml  Net 675 ml   Intake/Output: I/O last 3 completed shifts: In: 76 [P.O.:960; I.V.:20; IV Piggyback:50] Out: 625 [Urine:625]  Intake/Output this shift:  Total I/O In: 250 [I.V.:250] Out: 15 [Blood:15] Weight change:  Gen: NAD CVS: no rub Resp: cta Abd: +BS, soft, NT Ext: no edema, LUE AVG +T/B  Recent Labs  Lab 03/15/19 1204 03/16/19 0459 03/17/19 0346 03/18/19 0418 03/19/19 1026 03/20/19 0433  NA 133* 136 138 140 141 139  K 5.1 5.0 4.4 4.0 4.2 4.3  CL 101 103 107 105 109 107  CO2 21* 21* 18* 20* 21* 20*  GLUCOSE 160* 189* 175* 165* 126* 143*  BUN 56* 55* 58* 50* 42* 39*  CREATININE 3.24* 3.38* 3.76* 3.56* 3.33* 3.08*  ALBUMIN 2.5*  --   --  2.0*  --   --   CALCIUM 9.3 9.2 9.2 9.2 9.2 9.1  PHOS  --   --   --  4.0  --   --   AST 16  --   --   --   --   --   ALT 10  --   --   --   --   --    Liver Function Tests: Recent Labs  Lab 03/15/19 1204 03/18/19 0418  AST 16  --   ALT 10  --   ALKPHOS 85  --   BILITOT 0.5  --   PROT 7.9  --   ALBUMIN 2.5* 2.0*   Recent Labs  Lab 03/15/19 1204  LIPASE 51   No results for input(s): AMMONIA in the last 168 hours. CBC: Recent Labs  Lab 03/16/19 0459 03/17/19 0346 03/18/19 0418 03/19/19 1026 03/20/19 0433  WBC 43.2* 33.4* 31.2* 24.5* 20.2*  NEUTROABS  --   --   --  16.2* 12.8*  HGB 6.5* 6.5* 8.1* 8.5* 8.6*  HCT 19.8* 20.2* 24.6* 26.6* 26.0*  MCV 67.8* 72.4* 76.4* 77.3* 76.7*  PLT 746* 593* 623* 630* 578*   Cardiac Enzymes: No results for input(s): CKTOTAL, CKMB,  CKMBINDEX, TROPONINI in the last 168 hours. CBG: Recent Labs  Lab 03/19/19 1252 03/19/19 1639 03/19/19 2136 03/20/19 0901 03/20/19 1124  GLUCAP 106* 147* 144* 125* 121*    Iron Studies: No results for input(s): IRON, TIBC, TRANSFERRIN, FERRITIN in the last 72 hours. Studies/Results: No results found. Marland Kitchen amLODipine  5 mg Oral Daily  . atorvastatin  20 mg Oral Daily  . carvedilol  25 mg Oral BID WC  . Chlorhexidine Gluconate Cloth  6 each Topical Daily  . docusate sodium  100 mg Oral BID  . feeding supplement  1 Container Oral TID BM  . folic acid  1 mg Oral Daily  . gabapentin  300 mg Oral TID  . insulin aspart  0-9 Units Subcutaneous TID WC  . metoCLOPramide (REGLAN) injection  10 mg Intravenous Once  . pantoprazole  40 mg Oral Daily  . sodium chloride  flush  10-40 mL Intracatheter Q12H    BMET    Component Value Date/Time   NA 139 03/20/2019 0433   K 4.3 03/20/2019 0433   CL 107 03/20/2019 0433   CO2 20 (L) 03/20/2019 0433   GLUCOSE 143 (H) 03/20/2019 0433   BUN 39 (H) 03/20/2019 0433   CREATININE 3.08 (H) 03/20/2019 0433   CALCIUM 9.1 03/20/2019 0433   GFRNONAA 19 (L) 03/20/2019 0433   GFRAA 23 (L) 03/20/2019 0433   CBC    Component Value Date/Time   WBC 20.2 (H) 03/20/2019 0433   RBC 3.39 (L) 03/20/2019 0433   HGB 8.6 (L) 03/20/2019 0433   HCT 26.0 (L) 03/20/2019 0433   HCT 14.3 (LL) 11/07/2016 1518   PLT 578 (H) 03/20/2019 0433   MCV 76.7 (L) 03/20/2019 0433   MCH 25.4 (L) 03/20/2019 0433   MCHC 33.1 03/20/2019 0433   RDW 26.5 (H) 03/20/2019 0433   LYMPHSABS 2.6 03/20/2019 0433   MONOABS 3.2 (H) 03/20/2019 0433   EOSABS 1.4 (H) 03/20/2019 0433   BASOSABS 0.2 (H) 03/20/2019 0433    Assessment/Plan:  1. 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 He had an avg placed 2 years ago but it has clotted and he has been referred for new access, however he declined surgery. Now with recurrent colon cancer. He still is considering  dialysis if/when he needs it. 1. Appreciate Dr. Luther Todd assistance with LUE AVG placed 03/20/19. 2. No indication for HD at this time and Cr is at his baseline. 3. Need to have a goals of care meeting with he and his family especially if he is not a surgical candidate for his cancer. 2. Recurrent colon cancer-seen by [facility] and possible surgery tomorrow.  Now DNR and cardiology feels he is high risk.  Mr. [name] would like to proceed. 3. Anemia- SCA and CKD. Transfuse prn. No ESA due to progressive cancer. 4. Chronic diastolic chf- stable 5. DM- per primary svc 6. Cocaine abuse- UDS negative this admission.  7. Disposition- pt currently DNR and will discuss possible transition to hospice pending colon cancer workup.  Donetta Potts, MD [employer] [phone]

## 2019-03-20 NOTE — Op Note (Signed)
    OPERATIVE REPORT  DATE OF SURGERY: 03/20/2019  PATIENT: [name], 68 y.o. male MRN: [id number]  DOB: [birth year]  PRE-OPERATIVE DIAGNOSIS: Chronic renal insufficiency  POST-OPERATIVE DIAGNOSIS:  Same  PROCEDURE: Left upper arm AV Gore-Tex graft  SURGEON:  Curt Jews, M.D.  PHYSICIAN ASSISTANT: Liana Crocker, PA-C  ANESTHESIA: Local with sedation  EBL: per anesthesia record  Total I/O In: 250 [I.V.:250] Out: 15 [Blood:15]  BLOOD ADMINISTERED: none  DRAINS: none  SPECIMEN: none  COUNTS CORRECT:  YES  PATIENT DISPOSITION:  PACU - hemodynamically stable  PROCEDURE DETAILS: Patient was taken the operating placed supine position where the area of the left arm left axilla were prepped draped in sterile fashion.  Incision was made in the antecubital space and carried down to isolate the brachial artery which was of good caliber.  Separate incision was made over the axillary pulse in the axillary vein was notified and was also very good size.  A tunnel was created from the antecubital space to the axilla over the biceps.  This was in the subcuticular tissue.  A 7 x 4 mm tapered graft was brought through the tunnel.  The brachial artery was occluded proximally distally and was opened with 11 blade stenosing pot scissors.  A small arteriotomy was created.  The 4 mm portion of the graft was transected at approximately 60mm and spatulated.  This was sewn end-to-side to the artery with a running 6-0 Prolene suture.  Anastomosis was tested and found to be adequate.  The graft was flushed with heparinized saline and reoccluded.  Next the axillary vein was occluded proximally distally and was opened with 11 blade similarly with Potts scissors.  The graft was cut to the appropriate dimension and was sewn end-to-side to the vein with a running 6-0 Prolene suture.  Clamps removed and excellent thrill was noted.  The wounds irrigated with saline.  Hemostasis to electrocautery.  Wounds were  closed with 3-0 Vicryl in the subcutaneous and subcuticular tissue.  Patient maintained a 2-3+ radial pulse.  He was transferred to the recovery room in stable condition   [name], M.D., [facility] 03/20/2019 9:00 AM

## 2019-03-20 NOTE — Interval H&P Note (Signed)
History and Physical Interval Note:  03/20/2019 6:56 AM  [name]  has presented today for surgery, with the diagnosis of chronic kidney disease.  The various methods of treatment have been discussed with the patient and family. After consideration of risks, benefits and other options for treatment, the patient has consented to  Procedure(s): INSERTION OF ARTERIOVENOUS (AV) GORE-TEX GRAFT ARM (Left) as a surgical intervention.  The patient's history has been reviewed, patient examined, no change in status, stable for surgery.  I have reviewed the patient's chart and labs.  Questions were answered to the patient's satisfaction.     Curt Jews

## 2019-03-20 NOTE — Discharge Instructions (Signed)
° °  [facility]  Discharge Instructions  AV Fistula or Graft Surgery for Dialysis Access  Please refer to the following instructions for your post-procedure care. Your surgeon or physician assistant will discuss any changes with you.  Activity  You may drive the day following your surgery, if you are comfortable and no longer taking prescription pain medication. Resume full activity as the soreness in your incision resolves.  Bathing/Showering  You may shower after you go home. Keep your incision dry for 48 hours. Do not soak in a bathtub, hot tub, or swim until the incision heals completely. You may not shower if you have a hemodialysis catheter.  Incision Care  Clean your incision with mild soap and water after 48 hours. Pat the area dry with a clean towel. You do not need a bandage unless otherwise instructed. Do not apply any ointments or creams to your incision. You may have skin glue on your incision. Do not peel it off. It will come off on its own in about one week. Your arm may swell a bit after surgery. To reduce swelling use pillows to elevate your arm so it is above your heart. Your doctor will tell you if you need to lightly wrap your arm with an ACE bandage.  Diet  Resume your normal diet. There are not special food restrictions following this procedure. In order to heal from your surgery, it is CRITICAL to get adequate nutrition. Your body requires vitamins, minerals, and protein. Vegetables are the best source of vitamins and minerals. Vegetables also provide the perfect balance of protein. Processed food has little nutritional value, so try to avoid this.  Medications  Resume taking all of your medications. If your incision is causing pain, you may take over-the counter pain relievers such as acetaminophen (Tylenol). If you were prescribed a stronger pain medication, please be aware these medications can cause nausea and constipation. Prevent  nausea by taking the medication with a snack or meal. Avoid constipation by drinking plenty of fluids and eating foods with high amount of fiber, such as fruits, vegetables, and grains.  Do not take Tylenol if you are taking prescription pain medications.  Follow up Your surgeon may want to see you in the office following your access surgery. If so, this will be arranged at the time of your surgery.  Please call us immediately for any of the following conditions:  Increased pain, redness, drainage (pus) from your incision site Fever of 101 degrees or higher Severe or worsening pain at your incision site Hand pain or numbness.  Reduce your risk of vascular disease:  Stop smoking. If you would like help, call [facility] at 1-800-QUIT-NOW 8787114927) or [place] [facility] [place] your cholesterol Maintain a desired weight Control your diabetes Keep your blood pressure down  Dialysis  It will take several weeks to several months for your new dialysis access to be ready for use. Your surgeon will determine when it is okay to use it. Your nephrologist will continue to direct your dialysis. You can continue to use your Permcath until your new access is ready for use.   03/20/2019 REVANTH NEIDIG [id number] 1950-12-21  Surgeon(s): Early, Arvilla Meres, MD  Procedure(s): INSERTION OF ARTERIOVENOUS (AV) GORE-TEX GRAFT LEFT UPPER ARM  x Do not stick graft for 4 weeks    If you have any questions, please call the office at [phone].

## 2019-03-20 NOTE — Progress Notes (Signed)
[facility] PROGRESS NOTE  Neev Mcmains Gebhart HAL:[id number] DOB: [birth year] DOA: 03/15/2019 PCP: Nolene Ebbs, MD  Brief summary   [name] is a 68 y.o. male with medical history significant of sickle cell anemia (SS disease); ESRD not on HD; HTN; HLD; DM; and colon CA presenting with n/v.  He had a vocal cord lesion removed with CO2 laser excision during microlaryngoscopy on 10/7.  He went home and then started vomiting the evening of 10.7.  No vomiting prior to surgery.  It would come and go.  He has been losing a lot of weight and so has been drinking Boost.  Unintentional weight loss of about 50 pounds in the last 4-5 months.  He started having night sweats recently.  No LAD.  Slight diarrhea after the surgery, thought it was related to the anesthesia.  Normal BMs other than post-operatively.  Stools have been black because he has been taking iron.  He got 4 units of blood a week or two before the surgery, that increased his Hgb from 2.5 up to 8.9.  He has not yet started HD, still making urine.  His ex-wife has been caring for him for the last 3 weeks.  His colon cancer was 20 years ago.  He has never had a colonoscopy in over 25 years.  He was last admitted 8/13-15 for symptomatic anemia from CKD and given IV iron and Epogen.  Upon arrival to ED, patient had c/o abdominal pain and vomiting.  Had laryngeal biopsy a few days ago.  WBC 40k.  CT read as progression of colon CA at cecum and TI with ?typhlitis.  Patient was admitted under hospitalist service with GI and surgery consultation.  Patient underwent colonoscopy on 03/18/2019 which showed cecum and ascending colon mass which was biopsied.  Patient's hemoglobin dropped from 7.026.5 on 03/15/2019 and he received 1 unit of PRBC transfusion but then again it dropped to 6.5 so he received second unit of transfusion on 03/17/2019.  General surgery consulted cardiology and patient was cleared for surgery from cardiac standpoint.   Palliative medicine was also consulted per general surgery recommendations.  Patient decided to proceed with surgery.  Nephrology consulted vascular surgery for AV graft which was placed on 03/20/2019.  Assessment/Plan:  Colon cancer. Patient with remote h/o colon cancer (records not apparently available) without subsequent follow-up, ex-wife reports no colonoscopy in 25 years -Presented with RLQ abdominal pain, and n/v; also with 50 pounds unintentional weight loss in the last 4-5 months and recent night sweats -Imaging indicates recurrent colon cancer in the cecum/TI. This appears to be at least stage 3 with positive LN, No obstruction on CT. CEA level-elevated.  Patient underwent colonoscopy on 03/18/2019 which showed cecum and ascending colon mass which was biopsied.  Patient has now decided to proceed with surgery.  Cardiology has cleared the patient for surgery from cardiac standpoint stating he will be high risk candidate due to other medical problems.  Management deferred to general surgery.  Palliative care on board.    Stage 5 CKD -Patient has been preparing for HD but has not yet started. Nephrology prn order set utilized.  Seen by nephrology.  His renal function is at his baseline with some improvement today.  Nephrology had consulted vascular surgery for temporary HD and fistula in anticipation for hemodialysis.  Patient underwent left upper arm AV fistula placed on 03/20/2019.  Continues to have good urine output.  Defer further management to nephrology.  Acute blood loss anemia on  chronic anemia secondary to colon cancer/lower GI bleed vs sickle cell anemia: -Likely multifactorial including h/o sickle cell disease and stage 5 CKD but most likely due to his recurrence of colon cancer.  Received 1 unit of PRBC on 03/16/2019 and 1 unit again on 03/17/2019.  Hemoglobin has remained stable in mid 8s.  Watch daily.  HTN -Blood pressure controlled.  Continue Norvasc, Coreg  Chronic  diastolic CHF -06/7508 echo with preserved EF and grade 1 diastolic dysfunction -Despite stage 5 CKD, he does not appear to be volume overloaded at this time. -If he becomes volume overloaded, the likely treatment would be HD.  DM -Hemoglobin A1c 6.9 on 03/15/2019.  Hold Onglyza. Cover with sensitive-scale SSI.  Blood sugar fluctuating but acceptable so far.  HLD  -Continue Lipitor  Cocaine abuse -Patient denies use since July, but was positive at the time of last admission in August. -UDS negative this time.  Code Status: DNR Family Communication: Plan of care discussed with patient.  He had no further questions.  He is going to talk to his family about further plan of care. Disposition Plan: remains inpatient    Consultants:  Surgery  GI  Cardiology  Vascular surgery  Procedures:  Colonoscopy on 03/16/2089  Left upper arm AV graft placed on 03/20/2019  Antibiotics: Anti-infectives (From admission, onward)   Start     Dose/Rate Route Frequency Ordered Stop   03/16/19 0000  piperacillin-tazobactam (ZOSYN) IVPB 2.25 g     2.25 g 100 mL/hr over 30 Minutes Intravenous Every 8 hours 03/15/19 1527     03/15/19 1430  piperacillin-tazobactam (ZOSYN) IVPB 3.375 g     3.375 g 12.5 mL/hr over 240 Minutes Intravenous  Once 03/15/19 1423 03/15/19 1915       (indicate start date, and stop date if known)  HPI/Subjective: Patient seen and examined after return from AV graft placement.  Continues to complain of abdominal pain but it is slightly better than yesterday.  No other complaint.  Objective: Vitals:   03/20/19 0930 03/20/19 0951  BP: 127/68 133/68  Pulse: 63 61  Resp: 18   Temp: (!) 97.3 F (36.3 C) 97.9 F (36.6 C)  SpO2: 97% 96%    Intake/Output Summary (Last 24 hours) at 03/20/2019 1250 Last data filed at 03/20/2019 0841 Gross per 24 hour  Intake 790 ml  Output 115 ml  Net 675 ml   Filed Weights   03/18/19 0800  Weight: 64 kg     Exam:  General exam: Appears calm and comfortable  Respiratory system: Clear to auscultation. Respiratory effort normal. Cardiovascular system: S1 & S2 heard, RRR. No JVD, murmurs, rubs, gallops or clicks. No pedal edema. Gastrointestinal system: Abdomen is nondistended, firm and generalized tender, no organomegaly or masses felt. Normal bowel sounds heard. Central nervous system: Alert and oriented. No focal neurological deficits. Extremities: Symmetric 5 x 5 power. Skin: No rashes, lesions or ulcers.  Psychiatry: Judgement and insight appear normal. Mood & affect appropriate.    Data Reviewed: Basic Metabolic Panel: Recent Labs  Lab 03/16/19 0459 03/17/19 0346 03/18/19 0418 03/19/19 1026 03/20/19 0433  NA 136 138 140 141 139  K 5.0 4.4 4.0 4.2 4.3  CL 103 107 105 109 107  CO2 21* 18* 20* 21* 20*  GLUCOSE 189* 175* 165* 126* 143*  BUN 55* 58* 50* 42* 39*  CREATININE 3.38* 3.76* 3.56* 3.33* 3.08*  CALCIUM 9.2 9.2 9.2 9.2 9.1  PHOS  --   --  4.0  --   --  Liver Function Tests: Recent Labs  Lab 03/15/19 1204 03/18/19 0418  AST 16  --   ALT 10  --   ALKPHOS 85  --   BILITOT 0.5  --   PROT 7.9  --   ALBUMIN 2.5* 2.0*   Recent Labs  Lab 03/15/19 1204  LIPASE 51   No results for input(s): AMMONIA in the last 168 hours. CBC: Recent Labs  Lab 03/16/19 0459 03/17/19 0346 03/18/19 0418 03/19/19 1026 03/20/19 0433  WBC 43.2* 33.4* 31.2* 24.5* 20.2*  NEUTROABS  --   --   --  16.2* 12.8*  HGB 6.5* 6.5* 8.1* 8.5* 8.6*  HCT 19.8* 20.2* 24.6* 26.6* 26.0*  MCV 67.8* 72.4* 76.4* 77.3* 76.7*  PLT 746* 593* 623* 630* 578*   Cardiac Enzymes: No results for input(s): CKTOTAL, CKMB, CKMBINDEX, TROPONINI in the last 168 hours. BNP (last 3 results) Recent Labs    01/16/19 1820  BNP 1,036.2*    ProBNP (last 3 results) No results for input(s): PROBNP in the last 8760 hours.  CBG: Recent Labs  Lab 03/19/19 1252 03/19/19 1639 03/19/19 2136 03/20/19 0901  03/20/19 1124  GLUCAP 106* 147* 144* 125* 121*    Recent Results (from the past 240 hour(s))  SARS CORONAVIRUS 2 (TAT 6-24 HRS) Nasopharyngeal Nasopharyngeal Swab     Status: None   Collection Time: 03/15/19  4:28 PM   Specimen: Nasopharyngeal Swab  Result Value Ref Range Status   SARS Coronavirus 2 NEGATIVE NEGATIVE Final    Comment: (NOTE) SARS-CoV-2 target nucleic acids are NOT DETECTED. The SARS-CoV-2 RNA is generally detectable in upper and lower respiratory specimens during the acute phase of infection. Negative results do not preclude SARS-CoV-2 infection, do not rule out co-infections with other pathogens, and should not be used as the sole basis for treatment or other patient management decisions. Negative results must be combined with clinical observations, patient history, and epidemiological information. The expected result is Negative. Fact Sheet for Patients: SugarRoll.be Fact Sheet for Healthcare Providers: https://www.woods-mathews.com/ This test is not yet approved or cleared by the [place] FDA and  has been authorized for detection and/or diagnosis of SARS-CoV-2 by FDA under an Emergency Use Authorization (EUA). This EUA will remain  in effect (meaning this test can be used) for the duration of the COVID-19 declaration under Section 56 4(b)(1) of the Act, 21 U.S.C. section 360bbb-3(b)(1), unless the authorization is terminated or revoked sooner. Performed at [facility], [place] [address], [place], [place] [address]   Surgical pcr screen     Status: Abnormal   Collection Time: 03/20/19  1:16 AM   Specimen: Nasal Mucosa; Nasal Swab  Result Value Ref Range Status   MRSA, PCR NEGATIVE NEGATIVE Final   Staphylococcus aureus POSITIVE (A) NEGATIVE Final    Comment: (NOTE) The Xpert SA Assay (FDA approved for NASAL specimens in patients 88 years of age and older), is one component of a comprehensive surveillance  program. It is not intended to diagnose infection nor to guide or monitor treatment. Performed at [facility], [name] [address] [place], [place] [address]      Studies: No results found.  Scheduled Meds: . amLODipine  5 mg Oral Daily  . atorvastatin  20 mg Oral Daily  . carvedilol  25 mg Oral BID WC  . Chlorhexidine Gluconate Cloth  6 each Topical Daily  . docusate sodium  100 mg Oral BID  . feeding supplement  1 Container Oral TID BM  .  folic acid  1 mg Oral Daily  . gabapentin  300 mg Oral TID  . insulin aspart  0-9 Units Subcutaneous TID WC  . metoCLOPramide (REGLAN) injection  10 mg Intravenous Once  . pantoprazole  40 mg Oral Daily  . sodium chloride flush  10-40 mL Intracatheter Q12H   Continuous Infusions: . piperacillin-tazobactam (ZOSYN)  IV 2.25 g (03/19/19 2336)    Principal Problem:   Colon cancer ([place]) Active Problems:   Hypertension   Type 2 diabetes mellitus with renal complication (HCC)   CKD (chronic kidney disease) stage 5, GFR less than 15 ml/min (HCC)   Anemia   Malignant neoplasm of cecum ([place])  Time spent: 28 minutes  [facility] Pager [phone]. If 7PM-7AM, please contact night-coverage at [email or web address], password [facility] 03/20/2019, 12:50 PM  LOS: 5 days

## 2019-03-20 NOTE — Anesthesia Procedure Notes (Signed)
Procedure Name: MAC Date/Time: 03/20/2019 7:39 AM Performed by: Colin Benton, CRNA Pre-anesthesia Checklist: Patient identified, Emergency Drugs available, Suction available and Patient being monitored Patient Re-evaluated:Patient Re-evaluated prior to induction Oxygen Delivery Method: Simple face mask Induction Type: IV induction Placement Confirmation: positive ETCO2 Dental Injury: Teeth and Oropharynx as per pre-operative assessment

## 2019-03-20 NOTE — Anesthesia Postprocedure Evaluation (Signed)
Anesthesia Post Note  Patient: [name]  Procedure(s) Performed: INSERTION OF ARTERIOVENOUS (AV) GORE-TEX GRAFT LEFT UPPER ARM (Left Arm Upper)     Patient location during evaluation: PACU Anesthesia Type: MAC Level of consciousness: awake and alert Pain management: pain level controlled Vital Signs Assessment: post-procedure vital signs reviewed and stable Respiratory status: spontaneous breathing, nonlabored ventilation and respiratory function stable Cardiovascular status: stable and blood pressure returned to baseline Postop Assessment: no apparent nausea or vomiting Anesthetic complications: no    Last Vitals:  Vitals:   03/20/19 0915 03/20/19 0930  BP: 124/67 127/68  Pulse: 63 63  Resp: 13 18  Temp:  (!) 36.3 C  SpO2: 98% 97%    Last Pain:  Vitals:   03/20/19 0930  TempSrc:   PainSc: 0-No pain                 [place] Gipe,W. EDMOND

## 2019-03-20 NOTE — Progress Notes (Signed)
[facility]/Trauma Progress Note  Day of Surgery   Assessment/Plan Hx of colon cancer s/p some type of colon resection in 1982 Hx of sickle cell anemia ESRD not on HD DM HTN HLD CHF Cocaine abuse Anemia-Hgb 6.5 this a.m. transfusion as needed per medicine DNR  RLQ abdominal pain and anemia -CT scan showedwall thickening in the cecum, now also involving terminal ileum, with progressive regional adenopathy, suggesting progression of colon carcinoma -colonoscopy 10/13showedMalignant tumor in the ascending colon and in the cecum. path showed Poorly differentiated carcinoma - CEA 82.7 -suggest palliative care consultto discuss goals of care -Patient would like to proceed with both colon resection and HD cath -possible OR tomorrow, will discuss timing with MD. Cardiology states pt is high risk, RCRI=3 for colon surgery  FEN:CLD, do not advance past clears VTE: SCD's,chemical prophylaxis per medicine DX:AJOIN 10/10>>WBC 20.2, afebrile Foley:None Follow up:TBD    LOS: 5 days    Subjective: CC: RLQ abdominal pain  Pt is sleepy after vascular procedure. No issues overnight.   Objective: Vital signs in last 24 hours: Temp:  [97 F (36.1 C)-98.3 F (36.8 C)] 97.9 F (36.6 C) (10/15 0951) Pulse Rate:  [60-69] 61 (10/15 0951) Resp:  [11-19] 18 (10/15 0930) BP: (118-133)/(67-82) 133/68 (10/15 0951) SpO2:  [96 %-100 %] 96 % (10/15 0951) Last BM Date: 03/18/19  Intake/Output from previous day: 10/14 0701 - 10/15 0700 In: 660 [P.O.:600; I.V.:10; IV Piggyback:50] Out: 400 [Urine:400] Intake/Output this shift: Total I/O In: 250 [I.V.:250] Out: 15 [Blood:15]  PE: Gen: Alert, NAD, pleasant, cooperative Pulm:Rate andeffort normal Abd: Soft,ND,TTP in RLQ with guarding. No peritonitis Skin: no rashes noted, warm and dry   Anti-infectives: Anti-infectives (From admission, onward)   Start     Dose/Rate Route Frequency Ordered Stop   03/16/19 0000  piperacillin-tazobactam (ZOSYN) IVPB 2.25 g     2.25 g 100 mL/hr over 30 Minutes Intravenous Every 8 hours 03/15/19 1527     03/15/19 1430  piperacillin-tazobactam (ZOSYN) IVPB 3.375 g     3.375 g 12.5 mL/hr over 240 Minutes Intravenous  Once 03/15/19 1423 03/15/19 1915      Lab Results:  Recent Labs    03/19/19 1026 03/20/19 0433  WBC 24.5* 20.2*  HGB 8.5* 8.6*  HCT 26.6* 26.0*  PLT 630* 578*   BMET Recent Labs    03/19/19 1026 03/20/19 0433  NA 141 139  K 4.2 4.3  CL 109 107  CO2 21* 20*  GLUCOSE 126* 143*  BUN 42* 39*  CREATININE 3.33* 3.08*  CALCIUM 9.2 9.1   PT/INR No results for input(s): LABPROT, INR in the last 72 hours. CMP     Component Value Date/Time   NA 139 03/20/2019 0433   K 4.3 03/20/2019 0433   CL 107 03/20/2019 0433   CO2 20 (L) 03/20/2019 0433   GLUCOSE 143 (H) 03/20/2019 0433   BUN 39 (H) 03/20/2019 0433   CREATININE 3.08 (H) 03/20/2019 0433   CALCIUM 9.1 03/20/2019 0433   PROT 7.9 03/15/2019 1204   ALBUMIN 2.0 (L) 03/18/2019 0418   AST 16 03/15/2019 1204   ALT 10 03/15/2019 1204   ALKPHOS 85 03/15/2019 1204   BILITOT 0.5 03/15/2019 1204   GFRNONAA 19 (L) 03/20/2019 0433   GFRAA 23 (L) 03/20/2019 0433   Lipase     Component Value Date/Time   LIPASE 51 03/15/2019 1204    Studies/Results: No results found.   Kalman Drape, [facility] Pager [phone] M, T,  W, & Friday 7:00am - 4:30pm Thursdays 7:00am -11:30am

## 2019-03-21 ENCOUNTER — Inpatient Hospital Stay (HOSPITAL_COMMUNITY): Payer: Medicare HMO | Admitting: Anesthesiology

## 2019-03-21 ENCOUNTER — Encounter (HOSPITAL_COMMUNITY): Payer: Self-pay | Admitting: Vascular Surgery

## 2019-03-21 ENCOUNTER — Encounter (HOSPITAL_COMMUNITY)
Admission: EM | Disposition: A | Discharge status: Home / Self Care | Payer: Self-pay | Source: Home / Self Care | Attending: Family Medicine

## 2019-03-21 DIAGNOSIS — C182 Malignant neoplasm of ascending colon: Secondary | ICD-10-CM | POA: Diagnosis not present

## 2019-03-21 HISTORY — PX: LAPAROTOMY: SHX154

## 2019-03-21 LAB — CBC WITH DIFFERENTIAL/PLATELET
Abs Immature Granulocytes: 0.13 10*3/uL — ABNORMAL HIGH (ref 0.00–0.07)
Basophils Absolute: 0.1 10*3/uL (ref 0.0–0.1)
Basophils Relative: 1 %
Eosinophils Absolute: 1.4 10*3/uL — ABNORMAL HIGH (ref 0.0–0.5)
Eosinophils Relative: 8 %
HCT: 24.2 % — ABNORMAL LOW (ref 39.0–52.0)
Hemoglobin: 7.7 g/dL — ABNORMAL LOW (ref 13.0–17.0)
Immature Granulocytes: 1 %
Lymphocytes Relative: 16 %
Lymphs Abs: 2.7 10*3/uL (ref 0.7–4.0)
MCH: 25 pg — ABNORMAL LOW (ref 26.0–34.0)
MCHC: 31.8 g/dL (ref 30.0–36.0)
MCV: 78.6 fL — ABNORMAL LOW (ref 80.0–100.0)
Monocytes Absolute: 2.7 10*3/uL — ABNORMAL HIGH (ref 0.1–1.0)
Monocytes Relative: 16 %
Neutro Abs: 10 10*3/uL — ABNORMAL HIGH (ref 1.7–7.7)
Neutrophils Relative %: 58 %
Platelets: 474 10*3/uL — ABNORMAL HIGH (ref 150–400)
RBC: 3.08 MIL/uL — ABNORMAL LOW (ref 4.22–5.81)
RDW: 26.3 % — ABNORMAL HIGH (ref 11.5–15.5)
WBC: 17 10*3/uL — ABNORMAL HIGH (ref 4.0–10.5)
nRBC: 0.1 % (ref 0.0–0.2)

## 2019-03-21 LAB — BASIC METABOLIC PANEL
Anion gap: 10 (ref 5–15)
BUN: 37 mg/dL — ABNORMAL HIGH (ref 8–23)
CO2: 20 mmol/L — ABNORMAL LOW (ref 22–32)
Calcium: 8.8 mg/dL — ABNORMAL LOW (ref 8.9–10.3)
Chloride: 109 mmol/L (ref 98–111)
Creatinine, Ser: 3.2 mg/dL — ABNORMAL HIGH (ref 0.61–1.24)
GFR calc Af Amer: 22 mL/min — ABNORMAL LOW (ref >60)
GFR calc non Af Amer: 19 mL/min — ABNORMAL LOW (ref >60)
Glucose, Bld: 135 mg/dL — ABNORMAL HIGH (ref 70–99)
Potassium: 4.2 mmol/L (ref 3.5–5.1)
Sodium: 139 mmol/L (ref 135–145)

## 2019-03-21 LAB — GLUCOSE, CAPILLARY
Glucose-Capillary: 125 mg/dL — ABNORMAL HIGH (ref 70–99)
Glucose-Capillary: 80 mg/dL (ref 70–99)
Glucose-Capillary: 79 mg/dL (ref 70–99)
Glucose-Capillary: 112 mg/dL — ABNORMAL HIGH (ref 70–99)
Glucose-Capillary: 118 mg/dL — ABNORMAL HIGH (ref 70–99)

## 2019-03-21 LAB — PREPARE RBC (CROSSMATCH)

## 2019-03-21 LAB — PATHOLOGIST SMEAR REVIEW

## 2019-03-21 SURGERY — LAPAROTOMY, EXPLORATORY
Anesthesia: General | Site: Abdomen | Laterality: Right

## 2019-03-21 MED ORDER — OXYCODONE HCL 5 MG/5ML PO SOLN: 5.0000 mg | Freq: Once | ORAL | Status: DC | PRN

## 2019-03-21 MED ORDER — ALBUMIN HUMAN 5 % IV SOLN
INTRAVENOUS | Status: DC | PRN
  Administered 2019-03-21: 16:00:00 via INTRAVENOUS

## 2019-03-21 MED ORDER — PROPOFOL 10 MG/ML IV BOLUS
INTRAVENOUS | Status: AC
  Filled 2019-03-21: qty 20

## 2019-03-21 MED ORDER — VASOPRESSIN 20 UNIT/ML IV SOLN
INTRAVENOUS | Status: AC
  Filled 2019-03-21: qty 1

## 2019-03-21 MED ORDER — DEXAMETHASONE SODIUM PHOSPHATE 10 MG/ML IJ SOLN
INTRAMUSCULAR | Status: AC
  Filled 2019-03-21: qty 1

## 2019-03-21 MED ORDER — ACETAMINOPHEN 160 MG/5ML PO SOLN: 325.0000 mg | ORAL | Status: DC | PRN

## 2019-03-21 MED ORDER — FENTANYL CITRATE (PF) 100 MCG/2ML IJ SOLN
INTRAMUSCULAR | Status: AC
  Filled 2019-03-21: qty 2

## 2019-03-21 MED ORDER — LIDOCAINE 2% (20 MG/ML) 5 ML SYRINGE
INTRAMUSCULAR | Status: DC | PRN
  Administered 2019-03-21: 80 mg via INTRAVENOUS

## 2019-03-21 MED ORDER — 0.9 % SODIUM CHLORIDE (POUR BTL) OPTIME
TOPICAL | Status: DC | PRN
  Administered 2019-03-21 (×2): 1000 mL

## 2019-03-21 MED ORDER — SODIUM CHLORIDE 0.9 % IV SOLN
INTRAVENOUS | Status: DC | PRN
  Administered 2019-03-21: 30 ug/min via INTRAVENOUS

## 2019-03-21 MED ORDER — ACETAMINOPHEN 500 MG PO TABS
1000.0000 mg | ORAL_TABLET | Freq: Once | ORAL | Status: AC
  Administered 2019-03-21: 1000 mg via ORAL
  Filled 2019-03-21: qty 2

## 2019-03-21 MED ORDER — MEPERIDINE HCL 25 MG/ML IJ SOLN: 6.2500 mg | INTRAMUSCULAR | Status: DC | PRN

## 2019-03-21 MED ORDER — SODIUM CHLORIDE 0.9 % IV SOLN
2.0000 g | INTRAVENOUS | Status: AC
  Administered 2019-03-21: 2 g via INTRAVENOUS
  Filled 2019-03-21: qty 2

## 2019-03-21 MED ORDER — ROCURONIUM BROMIDE 10 MG/ML (PF) SYRINGE
PREFILLED_SYRINGE | INTRAVENOUS | Status: DC | PRN
  Administered 2019-03-21: 70 mg via INTRAVENOUS
  Administered 2019-03-21: 20 mg via INTRAVENOUS

## 2019-03-21 MED ORDER — LIDOCAINE 2% (20 MG/ML) 5 ML SYRINGE
INTRAMUSCULAR | Status: AC
  Filled 2019-03-21: qty 5

## 2019-03-21 MED ORDER — FENTANYL CITRATE (PF) 100 MCG/2ML IJ SOLN
25.0000 ug | INTRAMUSCULAR | Status: DC | PRN
  Administered 2019-03-21 (×3): 50 ug via INTRAVENOUS

## 2019-03-21 MED ORDER — SODIUM CHLORIDE 0.9 % IV SOLN
INTRAVENOUS | Status: DC
  Administered 2019-03-21 (×2): via INTRAVENOUS

## 2019-03-21 MED ORDER — DEXAMETHASONE SODIUM PHOSPHATE 10 MG/ML IJ SOLN
INTRAMUSCULAR | Status: DC | PRN
  Administered 2019-03-21: 4 mg via INTRAVENOUS

## 2019-03-21 MED ORDER — SODIUM CHLORIDE 0.9% IV SOLUTION
Freq: Once | INTRAVENOUS | Status: AC
  Administered 2019-03-21: 12:00:00 via INTRAVENOUS

## 2019-03-21 MED ORDER — PIPERACILLIN-TAZOBACTAM IN DEX 2-0.25 GM/50ML IV SOLN
2.2500 g | INTRAVENOUS | Status: AC
  Administered 2019-03-21: 2.25 g via INTRAVENOUS
  Filled 2019-03-21: qty 50

## 2019-03-21 MED ORDER — ONDANSETRON HCL 4 MG/2ML IJ SOLN: 4.0000 mg | Freq: Once | INTRAMUSCULAR | Status: DC | PRN

## 2019-03-21 MED ORDER — PROPOFOL 500 MG/50ML IV EMUL
INTRAVENOUS | Status: DC | PRN
  Administered 2019-03-21: 25 ug/kg/min via INTRAVENOUS

## 2019-03-21 MED ORDER — PROPOFOL 10 MG/ML IV BOLUS
INTRAVENOUS | Status: DC | PRN
  Administered 2019-03-21: 100 mg via INTRAVENOUS

## 2019-03-21 MED ORDER — SUGAMMADEX SODIUM 200 MG/2ML IV SOLN
INTRAVENOUS | Status: DC | PRN
  Administered 2019-03-21: 250 mg via INTRAVENOUS

## 2019-03-21 MED ORDER — FENTANYL CITRATE (PF) 250 MCG/5ML IJ SOLN
INTRAMUSCULAR | Status: AC
  Filled 2019-03-21: qty 5

## 2019-03-21 MED ORDER — OXYCODONE HCL 5 MG PO TABS
10.0000 mg | ORAL_TABLET | Freq: Four times a day (QID) | ORAL | Status: DC | PRN
  Administered 2019-03-21 – 2019-03-23 (×3): 10 mg via ORAL
  Filled 2019-03-21 (×3): qty 2

## 2019-03-21 MED ORDER — OXYCODONE HCL 5 MG PO TABS: 5.0000 mg | ORAL_TABLET | ORAL | Status: DC | PRN

## 2019-03-21 MED ORDER — OXYCODONE HCL 5 MG PO TABS: 5.0000 mg | ORAL_TABLET | Freq: Once | ORAL | Status: DC | PRN

## 2019-03-21 MED ORDER — FENTANYL CITRATE (PF) 250 MCG/5ML IJ SOLN
INTRAMUSCULAR | Status: DC | PRN
  Administered 2019-03-21: 50 ug via INTRAVENOUS

## 2019-03-21 MED ORDER — EPHEDRINE SULFATE 50 MG/ML IJ SOLN
INTRAMUSCULAR | Status: DC | PRN
  Administered 2019-03-21 (×2): 10 mg via INTRAVENOUS

## 2019-03-21 MED ORDER — ONDANSETRON HCL 4 MG/2ML IJ SOLN
INTRAMUSCULAR | Status: DC | PRN
  Administered 2019-03-21: 4 mg via INTRAVENOUS

## 2019-03-21 MED ORDER — ROCURONIUM BROMIDE 10 MG/ML (PF) SYRINGE
PREFILLED_SYRINGE | INTRAVENOUS | Status: AC
  Filled 2019-03-21: qty 10

## 2019-03-21 MED ORDER — ACETAMINOPHEN 325 MG PO TABS: 325.0000 mg | ORAL_TABLET | ORAL | Status: DC | PRN

## 2019-03-21 MED ORDER — ONDANSETRON HCL 4 MG/2ML IJ SOLN
INTRAMUSCULAR | Status: AC
  Filled 2019-03-21: qty 2

## 2019-03-21 SURGICAL SUPPLY — 22 items
COVER SURGICAL LIGHT HANDLE (MISCELLANEOUS) ×8 IMPLANT
DRSG OPSITE POSTOP 4X10 (GAUZE/BANDAGES/DRESSINGS) ×4 IMPLANT
GLOVE BIOGEL M STRL SZ7.5 (GLOVE) ×8 IMPLANT
GLOVE INDICATOR 8.0 STRL GRN (GLOVE) ×8 IMPLANT
GOWN STRL REUS W/ TWL LRG LVL3 (GOWN DISPOSABLE) ×4 IMPLANT
GOWN STRL REUS W/ TWL XL LVL3 (GOWN DISPOSABLE) ×4 IMPLANT
GOWN STRL REUS W/TWL LRG LVL3 (GOWN DISPOSABLE) ×4
GOWN STRL REUS W/TWL XL LVL3 (GOWN DISPOSABLE) ×4
LIGASURE IMPACT 36 18CM CVD LR (INSTRUMENTS) ×4 IMPLANT
PACK COLON (CUSTOM PROCEDURE TRAY) ×4 IMPLANT
PENCIL SMOKE EVACUATOR (MISCELLANEOUS) ×4 IMPLANT
RETRACTOR WND ALEXIS 25 LRG (MISCELLANEOUS) ×2 IMPLANT
RTRCTR WOUND ALEXIS 25CM LRG (MISCELLANEOUS) ×4
SPONGE LAP 18X18 X RAY DECT (DISPOSABLE) ×8 IMPLANT
STAPLER VISISTAT 35W (STAPLE) ×4 IMPLANT
SUT PDS AB 1 TP1 96 (SUTURE) ×8 IMPLANT
SUT SILK 2 0 TIES 10X30 (SUTURE) ×4 IMPLANT
SUT SILK 2 0SH CR/8 30 (SUTURE) ×4 IMPLANT
SUT SILK 3 0 TIES 10X30 (SUTURE) ×4 IMPLANT
SUT SILK 3 0SH CR/8 30 (SUTURE) ×4 IMPLANT
TUBE CONNECTING 12'X1/4 (SUCTIONS) ×2
TUBE CONNECTING 12X1/4 (SUCTIONS) ×6 IMPLANT

## 2019-03-21 NOTE — Op Note (Signed)
03/21/2019  4:38 PM  PATIENT:  [name]  68 y.o. male  Patient Care Team: Nolene Ebbs, MD as PCP - General (Internal Medicine) Debara Pickett Nadean Corwin, MD as PCP - Cardiology (Cardiology) Donato Heinz, MD as Consulting Physician (Nephrology)  PRE-OPERATIVE DIAGNOSIS:  Colon cancer  POST-OPERATIVE DIAGNOSIS:  Same  PROCEDURE:  Exploratory laparotomy  SURGEON:  Sharon Mt. Dema Severin, MD  ASSISTANT: Jackson Latino, PA-C  ANESTHESIA:   general  COUNTS:  Sponge, needle and instrument counts were reported correct x2 at the conclusion of the operation.  EBL: 50 mL  DRAINS: None  SPECIMEN: None  COMPLICATIONS: None  FINDINGS: Moderate volume of ascites. Massive right colon tumor which has replaced the entire cecum and ascending colon up to the hepatic flexure.  Invades lateral peritoneum and right pelvic sidewall. Occupies his entire right abdomen. Tumor had invaded through small bowel mesentery near the root of the small bowel mesentery and came out the backside of the mesentery where it was fixed to the posterior peritoneum overlying the aorta near its bifurcation. There was studding of a portion of the small bowel mesentery suspicious for implants which may indicated carcinomatosis.  It also invaded a portion of the ileum.  Any sort of "palliative" diversion would yield him with a proximal ileal/distal jejunostomy and would have to reach his left abdomen which with the fixed mesentery would be challenging. A palliative bypass is not without risk in the setting of protein calorie malnutrition and albumin of 2.5 with increased risk for leak. I called one of my partners to the room whom agreed with our assessment.  DISPOSITION: PACU in satisfactory condition  INDICATION: [name] is a very pleasant 68 year old male with history of sickle cell anemia; ESRD not yet on dialysis; hypertension; CHF; hyperlipidemia; diabetes mellitus; cocaine use and remote history of what he reports as  being colon cancer.  And states he had a open procedure 1982 to address this.  He had done well in the interim but had no further surveillance.  He presented following a recent laryngoscopy for vocal cord polyp with abdominal pain.  He was found to have leukocytosis and underwent CT scan.  This demonstrated a large mass in the right colon with associated lymphadenopathy and possible findings of "typhlitis."  He subsequently was found to have colon cancer on a subsequent colonoscopy after tolerating a prep.  He did not appear to have any obstructive symptoms.  He did have significant anemia in setting of sickle cell disease and did receive blood transfusion.  He has had deteriorating renal function now with CKD 5 and recently underwent placement of an AV graft by Dr. Donnetta Hutching.  We have been following him on the inpatient service.  Discussions were held regarding options moving forward and he met with palliative care.  He ultimately opted to pursue an attempt at surgery to address this.  Given the anemia and bleeding potentially coming from the tumor, we discussed proceeding with an attempt at right hemicolectomy.  He did have malnutrition noted preoperatively with an albumin of 2.0-2.5.  He had had an unintentional 50 pound weight loss but states he has been eating regular food and continue to lose weight despite this.  We discussed options moving forward and he opted to pursue surgery.  Please refer to notes elsewhere for details regarding this discussion.  DESCRIPTION: The patient was identified in preop holding and taken to the OR where he was placed on the operating room table. SCDs were placed. General endotracheal  anesthesia was induced without difficulty.  Pressure points were then evaluated and padded.  He was then prepped and draped in the usual sterile fashion. A surgical timeout was performed indicating the correct patient, procedure, positioning and need for preoperative antibiotics.   A midline  laparotomy incision was created.  The fascia was incised cephalad to the umbilicus above his prior surgery.  The peritoneum was carefully entered bluntly.  The fascia was then opened the length of the wound.  A moderate amount of clear ascites was present on entry.  He did have omental adhesions in the low midline which were carefully taken down.  He had omental and gastric adhesions to his abdominal wall up near his hepatic flexure likely from his prior open cholecystectomy.  These were partially taken down sharply.  Following this, a wound protector was placed.  A Balfour was used to facilitate retraction.  The abdomen was surveyed.  There was a massive tumor occupying his cecum and entire a sending colon up to the hepatic flexure.  There was tenting of the peritoneum along the lateral border of the cecum and this mass was fixed in this location and appeared to be growing into his right pelvic sidewall.  On the medial side, it had grown through the a fair amount of the ileal mesentery near the root and was then fixed to the posterior peritoneum overlying the aorta near the bifurcation.  Most of his ileum was somewhat fixed in this location because of this.  The mesentery had some studding at this location which likely indicates some degree of carcinomatosis.  The tumor occupied his entire right hemiabdomen.  Ideally we would resect this given his bleeding symptoms.  Palliative options were then considered including diversion which would require a distal jejunostomy given the tumor involvement of his ileum. With his underlying renal failure, this would not be without significant risk.  We had also considered bypass but in the setting of significant protein calorie malnutrition and currently without any obstructive symptoms, the potential for significant complications from this which would potentially require jejunostomy vs death.  I asked one my partners come to the room to review the findings and he was in  agreement with our assessment.   Given all these findings, the decision was made to proceed with abdominal closure.  The abdomen is irrigated.  Hemostasis was verified.  The omentum was brought down over the midline.  The midline fascia was then approximated with 2 running #1 looped PDS sutures.  The skin was then closed with staples.  He was then awakened from anesthesia, extubated, and transferred to a stretcher for transport to PACU in satisfactory condition.

## 2019-03-21 NOTE — Anesthesia Postprocedure Evaluation (Signed)
Anesthesia Post Note  Patient: [name]  Procedure(s) Performed: EXPLORATORY LAPAROTOMY (N/A Abdomen)     Patient location during evaluation: PACU Anesthesia Type: General Level of consciousness: awake and alert and oriented Pain management: pain level controlled Vital Signs Assessment: post-procedure vital signs reviewed and stable Respiratory status: spontaneous breathing, nonlabored ventilation, respiratory function stable and patient connected to nasal cannula oxygen Cardiovascular status: blood pressure returned to baseline and stable Postop Assessment: no apparent nausea or vomiting Anesthetic complications: no    Last Vitals:  Vitals:   03/21/19 1710 03/21/19 1728  BP: (!) 151/74 (!) 144/80  Pulse: (!) 56 (!) 56  Resp: 12   Temp: (!) 36.1 C   SpO2: 98% 100%    Last Pain:  Vitals:   03/21/19 1427  TempSrc: Oral  PainSc:                  Gissella Niblack A.

## 2019-03-21 NOTE — Transfer of Care (Signed)
Immediate Anesthesia Transfer of Care Note  Patient: [name]  Procedure(s) Performed: EXPLORATORY LAPAROTOMY (N/A Abdomen)  Patient Location: PACU  Anesthesia Type:General  Level of Consciousness: awake, alert , oriented and patient cooperative  Airway & Oxygen Therapy: Patient Spontanous Breathing and Patient connected to nasal cannula oxygen  Post-op Assessment: Report given to RN and Post -op Vital signs reviewed and stable  Post vital signs: Reviewed and stable  Last Vitals:  Vitals Value Taken Time  BP 154/78 03/21/19 1641  Temp    Pulse 57 03/21/19 1643  Resp 20 03/21/19 1643  SpO2 100 % 03/21/19 1643  Vitals shown include unvalidated device data.  Last Pain:  Vitals:   03/21/19 1427  TempSrc: Oral  PainSc:       Patients Stated Pain Goal: 3 (41/74/08 1448)  Complications: No apparent anesthesia complications

## 2019-03-21 NOTE — Progress Notes (Signed)
Pharmacy Antibiotic Note  [name] is a 68 y.o. male on Day # 73 Zosyn for intra-abdominal coverage.WBC remains elevated but has trended down some.  CKD5. Crcl remains ~15-20 ml/min. No need for HD at this time per Nephrology progress note on 10/15. Surgery plans palliative right hemicolectomy today. WBC is trending down. Patient remains afebrile.   Plan: Continue Zosyn 2.25 gm IV q8hrs. Follow-up length of therapy - need post operatively? Will monitor renal function and adjust dose as needed.   Height: 6\' 1"  (185.4 cm) Weight: 141 lb 1.5 oz (64 kg) IBW/kg (Calculated) : 79.9  Temp (24hrs), Avg:98.3 F (36.8 C), Min:97.3 F (36.3 C), Max:98.8 F (37.1 C)  Recent Labs  Lab 03/17/19 0346 03/18/19 0418 03/19/19 1026 03/20/19 0433 03/21/19 0402  WBC 33.4* 31.2* 24.5* 20.2* 17.0*  CREATININE 3.76* 3.56* 3.33* 3.08* 3.20*    Estimated Creatinine Clearance: 19.4 mL/min (A) (by C-G formula based on SCr of 3.2 mg/dL (H)).    Allergies  Allergen Reactions  . Morphine And Related Itching    Antimicrobials this admission:  Zosyn 10/10>>  Dose adjustments this admission:  n/a  Microbiology results: 10/10 COVID: negative  Thank you for allowing pharmacy to be a part of this patient's care.  Sloan Leiter, PharmD, BCPS, BCCCP Clinical Pharmacist Clinical phone 03/21/2019 until 3:[phone] Please refer to [facility] for [place] numbers 03/21/2019 9:20 AM

## 2019-03-21 NOTE — Progress Notes (Signed)
  Postoperative hemodialysis access     Date of Surgery:  03/20/2019 Surgeon: [name]  Subjective:  Denies pain in his left hand.   PHYSICAL EXAMINATION:  Vitals:   03/20/19 1759 03/20/19 2204  BP: 128/73 133/68  Pulse: 69 66  Resp: 17   Temp: 98.8 F (37.1 C) 98.5 F (36.9 C)  SpO2: 100% 97%    Incisions are clean and dry Sensation in digits is intact;  There is  Thrill  There is bruit. The graft/fistula is palpable    ASSESSMENT/PLAN:  [name] is a 68 y.o. year old male who is s/p LUA AVG.  -graft/fistula is patent -pt does not have evidence of steal sx -f/u with Dr. Donnetta Todd or [facility] PA to check incisions in a couple of weeks.   Our office will call him to arrange this.  -will sign off-call as needed.   Justin Locket, PA-C [facility] [phone]

## 2019-03-21 NOTE — Progress Notes (Signed)
Patient ID: [name], male   DOB: 1951-01-19, 68 y.o.   MRN: [id number] S: Feels well, no complaints O:BP (!) 148/78   Pulse (!) 57   Temp 97.8 F (36.6 C) (Oral)   Resp 16   Ht 6\' 1"  (1.854 m)   Wt 64 kg   SpO2 97%   BMI 18.62 kg/m   Intake/Output Summary (Last 24 hours) at 03/21/2019 1438 Last data filed at 03/21/2019 1425 Gross per 24 hour  Intake 435 ml  Output 275 ml  Net 160 ml   Intake/Output: I/O last 3 completed shifts: In: 34 [P.O.:180; I.V.:260; IV Piggyback:50] Out: 15 [Blood:15]  Intake/Output this shift:  Total I/O In: 315 [Blood:315] Out: 275 [Urine:275] Weight change:  Gen: NAD CVS: no rub Resp: cta Abd: +BS, soft, mildly tender to palpation, no guarding/rebound Ext: no edema, LUE AVG +T/B  Recent Labs  Lab 03/15/19 1204 03/16/19 0459 03/17/19 0346 03/18/19 0418 03/19/19 1026 03/20/19 0433 03/21/19 0402  NA 133* 136 138 140 141 139 139  K 5.1 5.0 4.4 4.0 4.2 4.3 4.2  CL 101 103 107 105 109 107 109  CO2 21* 21* 18* 20* 21* 20* 20*  GLUCOSE 160* 189* 175* 165* 126* 143* 135*  BUN 56* 55* 58* 50* 42* 39* 37*  CREATININE 3.24* 3.38* 3.76* 3.56* 3.33* 3.08* 3.20*  ALBUMIN 2.5*  --   --  2.0*  --   --   --   CALCIUM 9.3 9.2 9.2 9.2 9.2 9.1 8.8*  PHOS  --   --   --  4.0  --   --   --   AST 16  --   --   --   --   --   --   ALT 10  --   --   --   --   --   --    Liver Function Tests: Recent Labs  Lab 03/15/19 1204 03/18/19 0418  AST 16  --   ALT 10  --   ALKPHOS 85  --   BILITOT 0.5  --   PROT 7.9  --   ALBUMIN 2.5* 2.0*   Recent Labs  Lab 03/15/19 1204  LIPASE 51   No results for input(s): AMMONIA in the last 168 hours. CBC: Recent Labs  Lab 03/17/19 0346 03/18/19 0418 03/19/19 1026 03/20/19 0433 03/21/19 0402  WBC 33.4* 31.2* 24.5* 20.2* 17.0*  NEUTROABS  --   --  16.2* 12.8* 10.0*  HGB 6.5* 8.1* 8.5* 8.6* 7.7*  HCT 20.2* 24.6* 26.6* 26.0* 24.2*  MCV 72.4* 76.4* 77.3* 76.7* 78.6*  PLT 593* 623* 630* 578* 474*    Cardiac Enzymes: No results for input(s): CKTOTAL, CKMB, CKMBINDEX, TROPONINI in the last 168 hours. CBG: Recent Labs  Lab 03/20/19 1642 03/20/19 2202 03/21/19 0732 03/21/19 1119 03/21/19 1258  GLUCAP 93 183* 125* 80 79    Iron Studies: No results for input(s): IRON, TIBC, TRANSFERRIN, FERRITIN in the last 72 hours. Studies/Results: No results found. [facility] Hold] amLODipine  5 mg Oral Daily  . [MAR Hold] atorvastatin  20 mg Oral Daily  . [MAR Hold] carvedilol  25 mg Oral BID WC  . [MAR Hold] Chlorhexidine Gluconate Cloth  6 each Topical Daily  . [MAR Hold] docusate sodium  100 mg Oral BID  . [MAR Hold] feeding supplement  1 Container Oral TID BM  . [MAR Hold] folic acid  1 mg Oral Daily  . [MAR Hold] gabapentin  300 mg Oral  TID  . [MAR Hold] insulin aspart  0-9 Units Subcutaneous TID WC  . [MAR Hold] metoCLOPramide (REGLAN) injection  10 mg Intravenous Once  . [MAR Hold] mupirocin ointment  1 application Topical BID  . [MAR Hold] pantoprazole  40 mg Oral Daily  . [facility] Hold] sodium chloride flush  10-40 mL Intracatheter Q12H    BMET    Component Value Date/Time   NA 139 03/21/2019 0402   K 4.2 03/21/2019 0402   CL 109 03/21/2019 0402   CO2 20 (L) 03/21/2019 0402   GLUCOSE 135 (H) 03/21/2019 0402   BUN 37 (H) 03/21/2019 0402   CREATININE 3.20 (H) 03/21/2019 0402   CALCIUM 8.8 (L) 03/21/2019 0402   GFRNONAA 19 (L) 03/21/2019 0402   GFRAA 22 (L) 03/21/2019 0402   CBC    Component Value Date/Time   WBC 17.0 (H) 03/21/2019 0402   RBC 3.08 (L) 03/21/2019 0402   HGB 7.7 (L) 03/21/2019 0402   HCT 24.2 (L) 03/21/2019 0402   HCT 14.3 (LL) 11/07/2016 1518   PLT 474 (H) 03/21/2019 0402   MCV 78.6 (L) 03/21/2019 0402   MCH 25.0 (L) 03/21/2019 0402   MCHC 31.8 03/21/2019 0402   RDW 26.3 (H) 03/21/2019 0402   LYMPHSABS 2.7 03/21/2019 0402   MONOABS 2.7 (H) 03/21/2019 0402   EOSABS 1.4 (H) 03/21/2019 0402   BASOSABS 0.1 03/21/2019 0402      Assessment/Plan:  1. 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 He had an avg placed 2 years ago but it has clotted and he has been referred for new access, however he declined surgery. Now with recurrent colon cancer. He still is considering dialysis if/when he needs it. 1. Appreciate Dr. Luther Parody assistance with LUE AVG placed 03/20/19. 2. No indication for HD at this time andCris at his baseline. 3. Need to have a goals of care meeting with he and his family especially if he is not a surgical candidate for his cancer. 2. Recurrent colon cancer-seen by [facility] and possible surgery tomorrow.  Now DNR and cardiology feels he is high risk.  to have right hemicolectomy today by Dr. Dema Severin. 3. Anemia- SCA and CKD. Transfuse prn. No ESA due to progressive cancer. 4. Chronic diastolic chf- stable 5. DM- per primary svc 6. Cocaine abuse- UDS negative this admission.  7. Disposition- pt currently DNR and will discuss possible transition to hospice pending colon cancer workup.  Donetta Potts, MD [employer] [phone]

## 2019-03-21 NOTE — Progress Notes (Signed)
Subjective States he is ready for surgery  Objective: Vital signs in last 24 hours: Temp:  [97.8 F (36.6 C)-98.8 F (37.1 C)] 97.8 F (36.6 C) (10/16 1427) Pulse Rate:  [47-69] 57 (10/16 1427) Resp:  [16-18] 16 (10/16 1427) BP: (116-151)/(67-78) 148/78 (10/16 1427) SpO2:  [96 %-100 %] 97 % (10/16 1427) Weight:  [64 kg] 64 kg (10/16 1327) Last BM Date: 03/18/19  Intake/Output from previous day: 10/15 0701 - 10/16 0700 In: 430 [P.O.:180; I.V.:250] Out: 15 [Blood:15] Intake/Output this shift: Total I/O In: 315 [Blood:315] Out: 275 [Urine:275]  Gen: NAD, comfortable CV: RRR Pulm: Normal work of breathing Abd: Soft, thin, nontender, nondistended Ext: SCDs in place  Lab Results: CBC  Recent Labs    03/20/19 0433 03/21/19 0402  WBC 20.2* 17.0*  HGB 8.6* 7.7*  HCT 26.0* 24.2*  PLT 578* 474*   BMET Recent Labs    03/20/19 0433 03/21/19 0402  NA 139 139  K 4.3 4.2  CL 107 109  CO2 20* 20*  GLUCOSE 143* 135*  BUN 39* 37*  CREATININE 3.08* 3.20*  CALCIUM 9.1 8.8*   PT/INR No results for input(s): LABPROT, INR in the last 72 hours. ABG No results for input(s): PHART, HCO3 in the last 72 hours.  Invalid input(s): PCO2, PO2  Studies/Results:  Anti-infectives: Anti-infectives (From admission, onward)   Start     Dose/Rate Route Frequency Ordered Stop   03/16/19 0000  [MAR Hold]  piperacillin-tazobactam (ZOSYN) IVPB 2.25 g     (MAR Hold since Fri 03/21/2019 at 1320.Hold Reason: Transfer to a Procedural area.)   2.25 g 100 mL/hr over 30 Minutes Intravenous Every 8 hours 03/15/19 1527     03/15/19 1430  piperacillin-tazobactam (ZOSYN) IVPB 3.375 g     3.375 g 12.5 mL/hr over 240 Minutes Intravenous  Once 03/15/19 1423 03/15/19 1915       Assessment/Plan: Patient Active Problem List   Diagnosis Date Noted  . Malignant neoplasm of cecum ([place])   . Colon cancer ([place]) 03/15/2019  . Anemia 03/15/2019  . Congestive heart failure (CHF) ([place]) 01/17/2019  .  Acute on chronic diastolic CHF (congestive heart failure) ([place]) 01/16/2019  . Hyponatremia 01/16/2019  . Sepsis ([place]) 02/08/2018  . Opiate overdose ([place]) 11/13/2017  . Acute urinary obstruction 03/29/2017  . Symptomatic anemia   . Hb-SS disease with crisis ([place]) 03/27/2017  . Sickle cell pain crisis ([place]) 01/23/2017  . Heart murmur 03/27/2016  . CKD (chronic kidney disease) stage 5, GFR less than 15 ml/min (HCC) 03/26/2016  . Cocaine abuse ([place]) 03/25/2016  . Renal failure (ARF), acute on chronic (HCC)   . Hb-SS disease without crisis ([place])   . Hyperkalemia, diminished renal excretion 09/03/2014  . Leukocytosis 07/28/2011  . LBBB (left bundle branch block) 07/28/2011  . Hypertension   . Type 2 diabetes mellitus with renal complication (HCC)    -We discussed his diagnosis of potentially recurrent colon cancer, right colon which has been bleeding to the point of requiring transfusion. He was previously considering palliative/comfort care but has since decided to pursue surgery to address this. We have reviewed exploratory laparotomy with possible right hemicolectomy. We also discussed scenarios where the cancer has spread and surgery may not be feasible (carcinomatosis). We also reviewed potential for end ileostomy and quality of life surrounding this as well as potential issues related to this including dehydration, deterioration of renal function (which is already borderline requiring dialysis). -The anatomy and physiology of the GI tract was discussed at  length with the patient. The pathophysiology of colon cancer was discussed at length as well by both myself and Dr. Georgette Dover. -The planned procedure, material risks (including, but not limited to, pain, bleeding, infection, scarring, need for blood transfusion, damage to surrounding structures- blood vessels/nerves/viscus/organs, damage to ureter, urine leak, leak from anastomosis, need for additional procedures, need for stoma which may be  permanent, worsening of pre-existing medical conditions, hernia, recurrence, pneumonia, heart attack, stroke, death) benefits and alternatives to surgery were discussed at length. We discussed rational for this being to control the bleeding which has been significant. The patient's questions were answered to his satisfaction, he voiced understanding and elected to proceed with surgery. Additionally, we discussed typical postoperative expectations and the recovery process.   LOS: 6 days   Sharon Mt. Dema Severin, M.D. [facility]

## 2019-03-21 NOTE — Progress Notes (Signed)
[facility]/Trauma Progress Note  1 Day Post-Op   Assessment/Plan Hx of colon cancer s/p some type of colon resection in 1982 Hx of sickle cell anemia ESRD not on HD - S/P L upper arm HD graft, Dr. Donnetta Hutching, 10/15 DM HTN HLD CHF Cocaine abuse Anemia-Hgb 6.5 this a.m. transfusion as needed per medicine DNR  Cecal Mass - poorly differentiated carcinoma -colonoscopy10/13showedMalignant tumor in the ascending colon and in the cecum.path showedPoorly differentiated carcinoma - CEA 82.7 -suggest palliative care consultto discuss goals of care -Patient would like toproceed with colon resection - Cardiology states pt is high risk, RCRI=3 for colon surgery  FEN:NPO for OR VTE: SCD's,chemical prophylaxis per medicine ZO:XWRUE 10/10>>WBC down to 17.0, afebrile Foley:None Follow up:TBD  Plan: OR today   LOS: 6 days    Subjective: CC: RLQ abdominal pain  No issues overnight. No N or V. Pt ready for OR.   Objective: Vital signs in last 24 hours: Temp:  [98.5 F (36.9 C)-98.8 F (37.1 C)] 98.5 F (36.9 C) (10/15 2204) Pulse Rate:  [63-69] 66 (10/15 2204) Resp:  [17-18] 17 (10/15 1759) BP: (122-133)/(68-73) 133/68 (10/15 2204) SpO2:  [96 %-100 %] 97 % (10/15 2204) Last BM Date: 03/18/19  Intake/Output from previous day: 10/15 0701 - 10/16 0700 In: 430 [P.O.:180; I.V.:250] Out: 15 [Blood:15] Intake/Output this shift: No intake/output data recorded.  PE: Gen:  Alert, NAD, pleasant, cooperative Card:  RRR, no M/G/R heard, 2 + radial pulses bilaterally Pulm:  CTA, no W/R/R, effort normal Abd: Soft, NT/ND, +BS, no HSM, incisions C/D/I, drain with minimal sanguinous drainage, no abdominal scars noted Skin: no rashes noted, warm and dry   Anti-infectives: Anti-infectives (From admission, onward)   Start     Dose/Rate Route Frequency Ordered Stop   03/16/19 0000  piperacillin-tazobactam (ZOSYN) IVPB 2.25 g     2.25 g 100 mL/hr over 30  Minutes Intravenous Every 8 hours 03/15/19 1527     03/15/19 1430  piperacillin-tazobactam (ZOSYN) IVPB 3.375 g     3.375 g 12.5 mL/hr over 240 Minutes Intravenous  Once 03/15/19 1423 03/15/19 1915      Lab Results:  Recent Labs    03/20/19 0433 03/21/19 0402  WBC 20.2* 17.0*  HGB 8.6* 7.7*  HCT 26.0* 24.2*  PLT 578* 474*   BMET Recent Labs    03/20/19 0433 03/21/19 0402  NA 139 139  K 4.3 4.2  CL 107 109  CO2 20* 20*  GLUCOSE 143* 135*  BUN 39* 37*  CREATININE 3.08* 3.20*  CALCIUM 9.1 8.8*   PT/INR No results for input(s): LABPROT, INR in the last 72 hours. CMP     Component Value Date/Time   NA 139 03/21/2019 0402   K 4.2 03/21/2019 0402   CL 109 03/21/2019 0402   CO2 20 (L) 03/21/2019 0402   GLUCOSE 135 (H) 03/21/2019 0402   BUN 37 (H) 03/21/2019 0402   CREATININE 3.20 (H) 03/21/2019 0402   CALCIUM 8.8 (L) 03/21/2019 0402   PROT 7.9 03/15/2019 1204   ALBUMIN 2.0 (L) 03/18/2019 0418   AST 16 03/15/2019 1204   ALT 10 03/15/2019 1204   ALKPHOS 85 03/15/2019 1204   BILITOT 0.5 03/15/2019 1204   GFRNONAA 19 (L) 03/21/2019 0402   GFRAA 22 (L) 03/21/2019 0402   Lipase     Component Value Date/Time   LIPASE 51 03/15/2019 1204    Studies/Results: No results found.   Kalman Drape, [facility] Pager [phone] M, T,  W, & Friday 7:00am - 4:30pm Thursdays 7:00am -11:30am

## 2019-03-21 NOTE — Progress Notes (Signed)
[facility] PROGRESS NOTE  [name] XBL:[id number] DOB: 03-Apr-1951 DOA: 03/15/2019 PCP: Nolene Ebbs, MD  Brief summary   [name] is a 68 y.o. male with medical history significant of sickle cell anemia (SS disease); ESRD not on HD; HTN; HLD; DM; and colon CA presenting with n/v.  He had a vocal cord lesion removed with CO2 laser excision during microlaryngoscopy on 10/7.  He went home and then started vomiting the evening of 10.7.  No vomiting prior to surgery.  It would come and go.  He has been losing a lot of weight and so has been drinking Boost.  Unintentional weight loss of about 50 pounds in the last 4-5 months.  He started having night sweats recently.  No LAD.  Slight diarrhea after the surgery, thought it was related to the anesthesia.  Normal BMs other than post-operatively.  Stools have been black because he has been taking iron.  He got 4 units of blood a week or two before the surgery, that increased his Hgb from 2.5 up to 8.9.  He has not yet started HD, still making urine.  His ex-wife has been caring for him for the last 3 weeks.  His colon cancer was 20 years ago.  He has never had a colonoscopy in over 25 years.  He was last admitted 8/13-15 for symptomatic anemia from CKD and given IV iron and Epogen.  Upon arrival to ED, patient had c/o abdominal pain and vomiting.  Had laryngeal biopsy a few days ago.  WBC 40k.  CT read as progression of colon CA at cecum and TI with ?typhlitis.  Patient was admitted under hospitalist service with GI and surgery consultation.  Patient underwent colonoscopy on 03/18/2019 which showed cecum and ascending colon mass which was biopsied.  Patient's hemoglobin dropped from 7.026.5 on 03/15/2019 and he received 1 unit of PRBC transfusion but then again it dropped to 6.5 so he received second unit of transfusion on 03/17/2019.  General surgery consulted cardiology and patient was cleared for surgery from cardiac standpoint.   Palliative medicine was also consulted per general surgery recommendations.  Patient decided to proceed with surgery.  Nephrology consulted vascular surgery for AV graft which was placed on 03/20/2019.  Assessment/Plan:  Colon cancer. Patient with remote h/o colon cancer (records not apparently available) without subsequent follow-up, ex-wife reports no colonoscopy in 25 years -Presented with RLQ abdominal pain, and n/v; also with 50 pounds unintentional weight loss in the last 4-5 months and recent night sweats -Imaging indicates recurrent colon cancer in the cecum/TI. This appears to be at least stage 3 with positive LN, No obstruction on CT. CEA level-elevated.  Patient underwent colonoscopy on 03/18/2019 which showed cecum and ascending colon mass which was biopsied.  Patient has now decided to proceed with surgery.  Cardiology has cleared the patient for surgery from cardiac standpoint stating he will be high risk candidate due to other medical problems.  Patient is scheduled to have palliative right hemicolectomy today.  Stage 5 CKD -Patient has been preparing for HD but has not yet started. Nephrology prn order set utilized.  Seen by nephrology.  His renal function is at his baseline with some improvement today.  Nephrology had consulted vascular surgery for temporary HD and fistula in anticipation for hemodialysis.  Patient underwent left upper arm AV fistula placed on 03/20/2019.  Continues to have good urine output.  Defer further management to nephrology.  Acute blood loss anemia on chronic anemia secondary  to colon cancer/lower GI bleed vs sickle cell anemia: -Likely multifactorial including h/o sickle cell disease and stage 5 CKD but most likely due to his recurrence of colon cancer.  Received 1 unit of PRBC on 03/16/2019 and 1 unit again on 03/17/2019.  Hemoglobin has remained stable in mid 8s.  Watch daily.  HTN -Blood pressure controlled.  Continue Norvasc, Coreg  Chronic  diastolic CHF -77/4128 echo with preserved EF and grade 1 diastolic dysfunction -Despite stage 5 CKD, he does not appear to be volume overloaded at this time. -If he becomes volume overloaded, the likely treatment would be HD.  DM -Hemoglobin A1c 6.9 on 03/15/2019.  Hold Onglyza. Cover with sensitive-scale SSI.  Blood sugar fluctuating but acceptable so far.  HLD  -Continue Lipitor  Cocaine abuse -Patient denies use since July, but was positive at the time of last admission in August. -UDS negative this time.  Code Status: DNR Family Communication: Plan of care discussed with patient.  He had no further questions.  He is going to talk to his family about further plan of care. Disposition Plan: remains inpatient    Consultants:  Surgery  GI  Cardiology  Vascular surgery  Procedures:  Colonoscopy on 03/16/2089  Left upper arm AV graft placed on 03/20/2019  Antibiotics: Anti-infectives (From admission, onward)   Start     Dose/Rate Route Frequency Ordered Stop   03/16/19 0000  piperacillin-tazobactam (ZOSYN) IVPB 2.25 g     2.25 g 100 mL/hr over 30 Minutes Intravenous Every 8 hours 03/15/19 1527     03/15/19 1430  piperacillin-tazobactam (ZOSYN) IVPB 3.375 g     3.375 g 12.5 mL/hr over 240 Minutes Intravenous  Once 03/15/19 1423 03/15/19 1915       (indicate start date, and stop date if known)  HPI/Subjective: Patient seen and examined.  He feels better today.  Abdominal pain improved.  Looks comfortable.  No other complaint.  Objective: Vitals:   03/20/19 1759 03/20/19 2204  BP: 128/73 133/68  Pulse: 69 66  Resp: 17   Temp: 98.8 F (37.1 C) 98.5 F (36.9 C)  SpO2: 100% 97%    Intake/Output Summary (Last 24 hours) at 03/21/2019 1016 Last data filed at 03/20/2019 1856 Gross per 24 hour  Intake 180 ml  Output -  Net 180 ml   Filed Weights   03/18/19 0800  Weight: 64 kg    Exam:  General exam: Appears calm and comfortable  Respiratory  system: Clear to auscultation. Respiratory effort normal. Cardiovascular system: S1 & S2 heard, RRR. No JVD, murmurs, rubs, gallops or clicks. No pedal edema. Gastrointestinal system: Abdomen is nondistended, soft and generalized tenderness. No organomegaly or masses felt. Normal bowel sounds heard. Central nervous system: Alert and oriented. No focal neurological deficits. Extremities: Symmetric 5 x 5 power. Skin: No rashes, lesions or ulcers.  Psychiatry: Judgement and insight appear normal. Mood & affect appropriate.   Data Reviewed: Basic Metabolic Panel: Recent Labs  Lab 03/17/19 0346 03/18/19 0418 03/19/19 1026 03/20/19 0433 03/21/19 0402  NA 138 140 141 139 139  K 4.4 4.0 4.2 4.3 4.2  CL 107 105 109 107 109  CO2 18* 20* 21* 20* 20*  GLUCOSE 175* 165* 126* 143* 135*  BUN 58* 50* 42* 39* 37*  CREATININE 3.76* 3.56* 3.33* 3.08* 3.20*  CALCIUM 9.2 9.2 9.2 9.1 8.8*  PHOS  --  4.0  --   --   --    Liver Function Tests: Recent Labs  Lab 03/15/19  1204 03/18/19 0418  AST 16  --   ALT 10  --   ALKPHOS 85  --   BILITOT 0.5  --   PROT 7.9  --   ALBUMIN 2.5* 2.0*   Recent Labs  Lab 03/15/19 1204  LIPASE 51   No results for input(s): AMMONIA in the last 168 hours. CBC: Recent Labs  Lab 03/17/19 0346 03/18/19 0418 03/19/19 1026 03/20/19 0433 03/21/19 0402  WBC 33.4* 31.2* 24.5* 20.2* 17.0*  NEUTROABS  --   --  16.2* 12.8* 10.0*  HGB 6.5* 8.1* 8.5* 8.6* 7.7*  HCT 20.2* 24.6* 26.6* 26.0* 24.2*  MCV 72.4* 76.4* 77.3* 76.7* 78.6*  PLT 593* 623* 630* 578* 474*   Cardiac Enzymes: No results for input(s): CKTOTAL, CKMB, CKMBINDEX, TROPONINI in the last 168 hours. BNP (last 3 results) Recent Labs    01/16/19 1820  BNP 1,036.2*    ProBNP (last 3 results) No results for input(s): PROBNP in the last 8760 hours.  CBG: Recent Labs  Lab 03/20/19 0901 03/20/19 1124 03/20/19 1642 03/20/19 2202 03/21/19 0732  GLUCAP 125* 121* 93 183* 125*    Recent Results  (from the past 240 hour(s))  SARS CORONAVIRUS 2 (TAT 6-24 HRS) Nasopharyngeal Nasopharyngeal Swab     Status: None   Collection Time: 03/15/19  4:28 PM   Specimen: Nasopharyngeal Swab  Result Value Ref Range Status   SARS Coronavirus 2 NEGATIVE NEGATIVE Final    Comment: (NOTE) SARS-CoV-2 target nucleic acids are NOT DETECTED. The SARS-CoV-2 RNA is generally detectable in upper and lower respiratory specimens during the acute phase of infection. Negative results do not preclude SARS-CoV-2 infection, do not rule out co-infections with other pathogens, and should not be used as the sole basis for treatment or other patient management decisions. Negative results must be combined with clinical observations, patient history, and epidemiological information. The expected result is Negative. Fact Sheet for Patients: SugarRoll.be Fact Sheet for Healthcare Providers: https://www.woods-mathews.com/ This test is not yet approved or cleared by the [place] FDA and  has been authorized for detection and/or diagnosis of SARS-CoV-2 by FDA under an Emergency Use Authorization (EUA). This EUA will remain  in effect (meaning this test can be used) for the duration of the COVID-19 declaration under Section 56 4(b)(1) of the Act, 21 U.S.C. section 360bbb-3(b)(1), unless the authorization is terminated or revoked sooner. Performed at [facility], [place] [address] [place], [place] [address]   Surgical pcr screen     Status: Abnormal   Collection Time: 03/20/19  1:16 AM   Specimen: Nasal Mucosa; Nasal Swab  Result Value Ref Range Status   MRSA, PCR NEGATIVE NEGATIVE Final   Staphylococcus aureus POSITIVE (A) NEGATIVE Final    Comment: (NOTE) The Xpert SA Assay (FDA approved for NASAL specimens in patients 70 years of age and older), is one component of a comprehensive surveillance program. It is not intended to diagnose infection nor to guide or  monitor treatment. Performed at [facility], [place] [address] [place], [place] [address]      Studies: No results found.  Scheduled Meds: . amLODipine  5 mg Oral Daily  . atorvastatin  20 mg Oral Daily  . carvedilol  25 mg Oral BID WC  . Chlorhexidine Gluconate Cloth  6 each Topical Daily  . docusate sodium  100 mg Oral BID  . feeding supplement  1 Container Oral TID BM  . folic acid  1 mg Oral Daily  .  gabapentin  300 mg Oral TID  . insulin aspart  0-9 Units Subcutaneous TID WC  . metoCLOPramide (REGLAN) injection  10 mg Intravenous Once  . mupirocin ointment  1 application Topical BID  . pantoprazole  40 mg Oral Daily  . sodium chloride flush  10-40 mL Intracatheter Q12H   Continuous Infusions: . piperacillin-tazobactam (ZOSYN)  IV 2.25 g (03/21/19 0800)    Principal Problem:   Colon cancer (HCC) Active Problems:   Hypertension   Type 2 diabetes mellitus with renal complication (HCC)   CKD (chronic kidney disease) stage 5, GFR less than 15 ml/min (HCC)   Anemia   Malignant neoplasm of cecum ([place])  Time spent: 26 minutes  [facility] Pager [phone]. If 7PM-7AM, please contact night-coverage at [email or web address], password [facility] 03/21/2019, 10:16 AM  LOS: 6 days

## 2019-03-21 NOTE — Anesthesia Preprocedure Evaluation (Addendum)
Anesthesia Evaluation  Patient identified by MRN, date of birth, ID band Patient awake    Reviewed: Allergy & Precautions, NPO status , Patient's Chart, lab work & pertinent test results, reviewed documented beta blocker date and time   Airway Mallampati: II  TM Distance: >3 FB Neck ROM: Full    Dental  (+) Missing,    Pulmonary pneumonia, resolved, former smoker,    Pulmonary exam normal breath sounds clear to auscultation       Cardiovascular hypertension, Pt. on medications and Pt. on home beta blockers +CHF  Normal cardiovascular exam+ dysrhythmias + Valvular Problems/Murmurs  Rhythm:Regular Rate:Normal  Hx/o LBBB LVEF 59-55%   Neuro/Psych negative neurological ROS  negative psych ROS   GI/Hepatic GERD  Medicated and Controlled,(+)     substance abuse  cocaine use, Metastatic Colon Ca   Endo/Other  diabetes, Poorly Controlled, Type 2, Insulin DependentHyperlipidemia  Renal/GU ARF and CRFRenal disease  negative genitourinary   Musculoskeletal  (+) Arthritis , Osteoarthritis,    Abdominal   Peds  Hematology  (+) Sickle cell anemia and anemia ,   Anesthesia Other Findings   Reproductive/Obstetrics                            Anesthesia Physical Anesthesia Plan  ASA: III  Anesthesia Plan: General   Post-op Pain Management:    Induction: Intravenous  PONV Risk Score and Plan: 3 and Treatment may vary due to age or medical condition  Airway Management Planned: Oral ETT  Additional Equipment:   Intra-op Plan:   Post-operative Plan: Extubation in OR  Informed Consent: I have reviewed the patients History and Physical, chart, labs and discussed the procedure including the risks, benefits and alternatives for the proposed anesthesia with the patient or authorized representative who has indicated his/her understanding and acceptance.    Discussed DNR with patient and Suspend  DNR.   Dental advisory given  Plan Discussed with: CRNA and Surgeon  Anesthesia Plan Comments:         Anesthesia Quick Evaluation

## 2019-03-21 NOTE — Anesthesia Procedure Notes (Signed)
Procedure Name: Intubation Performed by: Milford Cage, CRNA Pre-anesthesia Checklist: Patient identified, Emergency Drugs available, Suction available and Patient being monitored Patient Re-evaluated:Patient Re-evaluated prior to induction Oxygen Delivery Method: Circle System Utilized Preoxygenation: Pre-oxygenation with 100% oxygen Induction Type: IV induction Ventilation: Mask ventilation without difficulty Laryngoscope Size: Miller and 3 Grade View: Grade II Tube type: Oral Tube size: 7.5 mm Number of attempts: 1 Airway Equipment and Method: Stylet Placement Confirmation: ETT inserted through vocal cords under direct vision,  positive ETCO2 and breath sounds checked- equal and bilateral Secured at: 22 cm Tube secured with: Tape Dental Injury: Teeth and Oropharynx as per pre-operative assessment

## 2019-03-22 ENCOUNTER — Encounter (HOSPITAL_COMMUNITY): Payer: Self-pay | Admitting: Surgery

## 2019-03-22 DIAGNOSIS — N185 Chronic kidney disease, stage 5: Secondary | ICD-10-CM | POA: Diagnosis not present

## 2019-03-22 DIAGNOSIS — D649 Anemia, unspecified: Secondary | ICD-10-CM | POA: Diagnosis not present

## 2019-03-22 DIAGNOSIS — C182 Malignant neoplasm of ascending colon: Principal | ICD-10-CM

## 2019-03-22 DIAGNOSIS — Z7189 Other specified counseling: Secondary | ICD-10-CM | POA: Diagnosis not present

## 2019-03-22 LAB — BPAM RBC
Blood Product Expiration Date: 202011212359
ISSUE DATE / TIME: 202010161133
Unit Type and Rh: 8400

## 2019-03-22 LAB — CBC WITH DIFFERENTIAL/PLATELET
Abs Immature Granulocytes: 0.13 10*3/uL — ABNORMAL HIGH (ref 0.00–0.07)
Basophils Absolute: 0 10*3/uL (ref 0.0–0.1)
Basophils Relative: 0 %
Eosinophils Absolute: 0 10*3/uL (ref 0.0–0.5)
Eosinophils Relative: 0 %
HCT: 30.8 % — ABNORMAL LOW (ref 39.0–52.0)
Hemoglobin: 10.1 g/dL — ABNORMAL LOW (ref 13.0–17.0)
Immature Granulocytes: 1 %
Lymphocytes Relative: 7 %
Lymphs Abs: 1.4 10*3/uL (ref 0.7–4.0)
MCH: 26 pg (ref 26.0–34.0)
MCHC: 32.8 g/dL (ref 30.0–36.0)
MCV: 79.2 fL — ABNORMAL LOW (ref 80.0–100.0)
Monocytes Absolute: 0.4 10*3/uL (ref 0.1–1.0)
Monocytes Relative: 2 %
Neutro Abs: 19.3 10*3/uL — ABNORMAL HIGH (ref 1.7–7.7)
Neutrophils Relative %: 90 %
Platelets: 480 10*3/uL — ABNORMAL HIGH (ref 150–400)
RBC: 3.89 MIL/uL — ABNORMAL LOW (ref 4.22–5.81)
RDW: 24.8 % — ABNORMAL HIGH (ref 11.5–15.5)
WBC: 21.2 10*3/uL — ABNORMAL HIGH (ref 4.0–10.5)
nRBC: 0 % (ref 0.0–0.2)

## 2019-03-22 LAB — TYPE AND SCREEN
ABO/RH(D): AB POS
Antibody Screen: NEGATIVE
Unit division: 0

## 2019-03-22 LAB — GLUCOSE, CAPILLARY
Glucose-Capillary: 219 mg/dL — ABNORMAL HIGH (ref 70–99)
Glucose-Capillary: 203 mg/dL — ABNORMAL HIGH (ref 70–99)
Glucose-Capillary: 211 mg/dL — ABNORMAL HIGH (ref 70–99)
Glucose-Capillary: 135 mg/dL — ABNORMAL HIGH (ref 70–99)

## 2019-03-22 NOTE — Progress Notes (Signed)
[facility]/Trauma Progress Note  1 Day Post-Op   Assessment/Plan Hx of colon cancer s/p some type of colon resection in 1982 Hx of sickle cell anemia ESRD not on HD - S/P L upper arm HD graft, Dr. Donnetta Hutching, 10/15 DM HTN HLD CHF Cocaine abuse Anemia-Hgb 6.5 this a.m. transfusion as needed per medicine DNR  Cecal Mass - poorly differentiated carcinoma -colonoscopy10/13showedMalignant tumor in the ascending colon and in the cecum.path showedPoorly differentiated carcinoma - CEA 82.7 - s/p exploratory laparotomy, Dr. Dema Severin, 10/16 - Intraoperative findings were that tumor was unresectable - Recommend oncology and palliative care consult  FEN:CLD VTE: SCD's,chemical prophylaxis per medicine DI:YMEBR 10/10>>WBC21.2, afebrile Foley:None Follow up:TBD    LOS: 7 days    Subjective: CC: Abdominal pain  Patient states pain is mild.  He denies nausea or vomiting.  He denies flatus at this time.  He states it is okay to discuss his care with his son.  Objective: Vital signs in last 24 hours: Temp:  [97 F (36.1 C)-98.3 F (36.8 C)] 98 F (36.7 C) (10/17 0930) Pulse Rate:  [47-76] 68 (10/17 0930) Resp:  [12-18] 16 (10/17 0930) BP: (116-156)/(67-102) 144/78 (10/17 0930) SpO2:  [97 %-100 %] 98 % (10/17 0930) Weight:  [64 kg] 64 kg (10/16 1327) Last BM Date: 03/18/19  Intake/Output from previous day: 10/16 0701 - 10/17 0700 In: 955 [P.O.:220; I.V.:170; Blood:315; IV Piggyback:250] Out: 830 [Urine:825; Blood:50] Intake/Output this shift: Total I/O In: 150 [P.O.:150] Out: -   PE: Gen: Alert, NAD, pleasant, cooperative Pulm:Rate andeffort normal Abd: Soft,ND,normal bowel sounds, midline with staples covered in honeycomb dressing, mild generalized TTP without guarding.  No peritonitis Skin: no rashes noted, warm and dry   Anti-infectives: Anti-infectives (From admission, onward)   Start     Dose/Rate Route Frequency Ordered Stop   03/21/19 1545  cefoTEtan (CEFOTAN) 2 g in sodium chloride 0.9 % 100 mL IVPB     2 g 200 mL/hr over 30 Minutes Intravenous To Surgery 03/21/19 1542 03/21/19 1603   03/21/19 1515  piperacillin-tazobactam (ZOSYN) IVPB 2.25 g     2.25 g 100 mL/hr over 30 Minutes Intravenous To Surgery 03/21/19 1504 03/21/19 1532   03/16/19 0000  piperacillin-tazobactam (ZOSYN) IVPB 2.25 g     2.25 g 100 mL/hr over 30 Minutes Intravenous Every 8 hours 03/15/19 1527     03/15/19 1430  piperacillin-tazobactam (ZOSYN) IVPB 3.375 g     3.375 g 12.5 mL/hr over 240 Minutes Intravenous  Once 03/15/19 1423 03/15/19 1915      Lab Results:  Recent Labs    03/21/19 0402 03/22/19 0323  WBC 17.0* 21.2*  HGB 7.7* 10.1*  HCT 24.2* 30.8*  PLT 474* 480*   BMET Recent Labs    03/20/19 0433 03/21/19 0402  NA 139 139  K 4.3 4.2  CL 107 109  CO2 20* 20*  GLUCOSE 143* 135*  BUN 39* 37*  CREATININE 3.08* 3.20*  CALCIUM 9.1 8.8*   PT/INR No results for input(s): LABPROT, INR in the last 72 hours. CMP     Component Value Date/Time   NA 139 03/21/2019 0402   K 4.2 03/21/2019 0402   CL 109 03/21/2019 0402   CO2 20 (L) 03/21/2019 0402   GLUCOSE 135 (H) 03/21/2019 0402   BUN 37 (H) 03/21/2019 0402   CREATININE 3.20 (H) 03/21/2019 0402   CALCIUM 8.8 (L) 03/21/2019 0402   PROT 7.9 03/15/2019 1204   ALBUMIN 2.0 (L) 03/18/2019 0418   AST 16  03/15/2019 1204   ALT 10 03/15/2019 1204   ALKPHOS 85 03/15/2019 1204   BILITOT 0.5 03/15/2019 1204   GFRNONAA 19 (L) 03/21/2019 0402   GFRAA 22 (L) 03/21/2019 0402   Lipase     Component Value Date/Time   LIPASE 51 03/15/2019 1204    Studies/Results: No results found.   Kalman Drape, [facility] Pager [phone] Cristine Polio, & Friday 7:00am - 4:30pm Thursdays 7:00am -11:30am

## 2019-03-22 NOTE — Consult Note (Signed)
[place] NOTE  Patient Care Team: Justin Ebbs, MD as PCP - General (Internal Medicine) [name] Justin Corwin, MD as PCP - Cardiology (Cardiology) Justin Heinz, MD as Consulting Physician (Nephrology)  HEME/ONC OVERVIEW: 1. Metastatic poorly differentiated adenocarcinoma of ascending colon -03/2019:  Marked wall thickening involving the terminal ileum and cecum, regional adenopathy  Ex lap showed massive R colon tumor replacing the entire cecum and ascending colon up to the hepatic flexure, invades peritoneum and pelvic sidewall, through the small bowel mesentery with studding, c/w carcinomatosis; not operable   PERTINENT NON-HEM/ONC PROBLEMS: 1. Stage IV CKD, not on dialysis (AVG in the LUL created in 03/2019), on ESA by nephrology 2. Recent hx of cocaine abuse (UDS last positive in 01/2019) 3. DM II 4. Sickle cell disease (S-beta thalassemia)  ASSESSMENT & PLAN:   Metastatic poorly differentiated adenocarcinoma of ascending colon -I reviewed patient's records in detail, including GI and general surgery notes, lab studies, imaging results, and the pathology reports -I also independently reviewed the radiologic measures of recent CT abdomen/pelvis, and agree with the findings as documented -In summary, patient has extensive medical comorbidities, including Stage IV CKD not on dialysis, sickle cell disease, hypertension and diabetes who presented with refractory nausea and vomiting, as well as 50 pound weight loss in the past 4 to 5 months.  In the ER, labs were notable for Hgb of 6.6.  CT abdomen/pelvis showed marked wall thickening involving the terminal ileum and the cecum, as well as regional adenopathy.  After lengthy discussion of the benefits and the risks of surgery, patient was taken to the OR for exploratory laparotomy, which showed massive right colon tumor replacing the entire cecum and the ascending colon up to the hepatic flexure, invading the  peritoneum and the pelvic sidewall, through the small bowel mesentery with studding, consistent with carcinomatosis.  Due to the inoperable nature of the disease, the surgery was aborted, and oncology was consulted for discussion of systemic therapy options. -I discussed the imaging and pathology results in detail with the patient -I also reviewed NCCN guidelines detail with the patient -Given the very advanced nature of the colon malignancy, surgical resection is not feasible.  I discussed with patient that the mainstay treatment for metastatic colon cancer is chemotherapy. -I emphasized that the goal of treatment would be for palliative intent only, not curative -In addition, given the patient's multiple medical comorbidities, he is considered at high risk for developing adverse outcomes from chemotherapy.  However, patient was fairly independent prior to the most recent hospitalization, and while I have serious reservations regarding the patient's ability to tolerate side effects of chemotherapy, the potential risks are not prohibitive that he absolutely cannot receive any treatment -After lengthy discussion, patient expressed understanding, and would like to discuss it further with his family, including his son (healthcare POA), before making a decision -While awaiting the patient decision, I would recommend completing the staging study with CT chest w/o contrast to rule out any pulmonary metastases, which may aid goals of care discussion (see below)  Microcytic anemia -Likely multifactorial, including occult blood loss from colon malignancy, anemia of chronic disease, Stage IV CKD, and sickle/beta thal disease  -Patient received IV iron in 01/2019 and is on ESA (managed by nephrology) -He has received several units of RBC's since admission -If his Hgb drops again, I would consider repeating IV iron prior to discharge -Okay to stop oral iron due to constipation   Stage V CKD -Followed by  nephrology -AVG placed in LUL on 03/20/2019 -Continue follow-up with vascular surgery and nephrology  Goals of care discussion -I discussed with the patient that in light of the peritoneal carcinomatosis, this is consistent with metastatic colon cancer, and the goal of treatment would be for palliative intent only, not curative -I also expressed my concern about his ability to tolerate side effects of chemotherapy and to sustain prolonged treatment due to his progressive decline and multiple medical comorbidities -While the risks of side effects are not prohibitive that he cannot receive treatment, he is at high risk for developing serious side effects from treatment -Patient has had several family members that died of cancer while on treatment, and he has witnessed their difficult treatment course; therefore, before making a decision, he would like to discuss this further with his family -I also contacted the patient's son, [name], over the phone and updated him on my discussion with the patient  -I agree with palliative care consult to continue [place] discussion   Thank you for the consult. Oncology will re-visit the goals of care discussion on Monday to see if the patient has any updates.   Justin Men, MD 03/22/2019 2:20 PM   PURPOSE OF CONSULTATION:  Newly diagnosed colon cancer   HISTORY OF PRESENTING ILLNESS:  [name] 68 y.o. male is here because of newly diagnosed colon cancer.  Patient has extensive medical comorbidities, including Stage IV CKD not on dialysis, sickle cell disease, hypertension and diabetes who presented with refractory nausea and vomiting, as well as 50 pound weight loss in the past 4 to 5 months.  In the ER, labs were notable for Hgb of 6.6.  CT abdomen/pelvis showed marked wall thickening involving the terminal ileum and the cecum, as well as regional adenopathy.  After lengthy discussion of the benefits and the risks of surgery, patient was taken to the OR for  exploratory laparotomy, which showed massive right colon tumor replacing the entire cecum and the ascending colon up to the hepatic flexure, invading the peritoneum and the pelvic sidewall, through the small bowel mesentery with studding, consistent with carcinomatosis.  Due to the inoperable nature of the disease, the surgery was aborted, and oncology was consulted for discussion of systemic therapy options.  Patient reports that his abdomen is sore, 6-7/10, localized to the surgical wounds, but he has not yet called for any pain medication.  He lives alone, and has a few family members living close by.  He had been relatively independent w/ ADL's until a few months ago, when he became more dependent on his ex-wife for grocery shopping, etc.  He denies any other complaint today.   REVIEW OF SYSTEMS:   Constitutional: ( - ) fevers, ( - )  chills , ( - ) night sweats Eyes: ( - ) blurriness of vision, ( - ) double vision, ( - ) watery eyes Ears, nose, mouth, throat, and face: ( - ) mucositis, ( - ) sore throat Respiratory: ( - ) cough, ( - ) dyspnea, ( - ) wheezes Cardiovascular: ( - ) palpitation, ( - ) chest discomfort, ( - ) lower extremity swelling Gastrointestinal:  ( - ) nausea, ( - ) heartburn, ( + ) change in bowel habits Skin: ( - ) abnormal skin rashes Lymphatics: ( - ) new lymphadenopathy, ( - ) easy bruising Neurological: ( - ) numbness, ( - ) tingling, ( - ) new weaknesses Behavioral/Psych: ( - ) mood change, ( - ) new changes  All other systems were reviewed with the patient and are negative.  I have reviewed his chart and materials related to his cancer extensively and collaborated history with the patient. Summary of oncologic history is as follows: Oncology History   No history exists.    MEDICAL HISTORY:  Past Medical History:  Diagnosis Date  . Arthritis   . Cancer ([place])    colon  . Cataracts, bilateral    surgery to remove  . Diabetes mellitus    type 2  . GERD  (gastroesophageal reflux disease)   . Heart murmur   . History of kidney stones    passed stone, no surgery  . Hyperlipidemia   . Hypertension   . Lesion of vocal cord   . Pneumonia 2019   x 2  . Renal insufficiency    no dialysis - stagte 5  . Sickle cell anemia (HCC)    Hgb SS disease   . Wears glasses     SURGICAL HISTORY: Past Surgical History:  Procedure Laterality Date  . AV FISTULA PLACEMENT Left 11/22/2016   Procedure: INSERTION OF ARTERIOVENOUS (AV) GORE-TEX GRAFT  LEFT FOREARM USING 6MM X 50CM GORETEX GRAFT;  Surgeon: Serafina Mitchell, MD;  Location: [facility] OR;  Service: Vascular;  Laterality: Left;  . AV FISTULA PLACEMENT Left 03/20/2019   Procedure: INSERTION OF ARTERIOVENOUS (AV) GORE-TEX GRAFT LEFT UPPER ARM;  Surgeon: Rosetta Posner, MD;  Location: [facility];  Service: Vascular;  Laterality: Left;  . BIOPSY  03/18/2019   Procedure: BIOPSY;  Surgeon: Gatha Mayer, MD;  Location: [facility];  Service: Endoscopy;;  . CATARACT EXTRACTION W/ INTRAOCULAR LENS  IMPLANT, BILATERAL    . CHOLECYSTECTOMY    . CO2 LASER APPLICATION N/A 29/10/1882   Procedure: CO2 LASER EXCISION OF VOCAL FOLD LESION;  Surgeon: Melida Quitter, MD;  Location: [facility];  Service: ENT;  Laterality: N/A;  . COLON SURGERY     Colon cancer surgery, removed small amt not full colectomy  . COLONOSCOPY     nclear who follows him for this  . COLONOSCOPY WITH PROPOFOL N/A 03/18/2019   Procedure: COLONOSCOPY WITH PROPOFOL;  Surgeon: Gatha Mayer, MD;  Location: [facility] ENDOSCOPY;  Service: Endoscopy;  Laterality: N/A;  . DIRECT LARYNGOSCOPY N/A 03/12/2019   Procedure: SUSPENDED MICRO DIRECT LARYNGOSCOPY;  Surgeon: Melida Quitter, MD;  Location: [facility];  Service: ENT;  Laterality: N/A;  . IR FLUORO GUIDE CV LINE RIGHT  03/17/2019  . IR US GUIDE VASC ACCESS RIGHT  03/17/2019  . LAPAROTOMY N/A 03/21/2019   Procedure: EXPLORATORY LAPAROTOMY;  Surgeon: Ileana Roup, MD;  Location: [facility];  Service: General;   Laterality: N/A;  . LEG SURGERY     For ? gangrene after running into barbwire fence at 68yo  . MULTIPLE TOOTH EXTRACTIONS     no dentures    SOCIAL HISTORY: Social History   Socioeconomic History  . Marital status: Divorced    Spouse name: Not on file  . Number of children: Not on file  . Years of education: Not on file  . Highest education level: Not on file  Occupational History  . Occupation: retired  Scientific laboratory technician  . Financial resource strain: Not on file  . Food insecurity    Worry: Not on file    Inability: Not on file  . Transportation needs    Medical: Not on file    Non-medical: Not on file  Tobacco Use  . Smoking status: Former Smoker  Packs/day: 0.50    Years: 20.00    Pack years: 10.00    Types: Cigarettes    Quit date: 06/05/1966    Years since quitting: 52.8  . Smokeless tobacco: Never Used  Substance and Sexual Activity  . Alcohol use: Yes    Comment: rare beer, last use maybe July  . Drug use: Not Currently    Types: Cocaine, "Crack" cocaine    Comment: Last use July 2020  . Sexual activity: Not on file  Lifestyle  . Physical activity    Days per week: Not on file    Minutes per session: Not on file  . Stress: Not on file  Relationships  . Social Herbalist on phone: Not on file    Gets together: Not on file    Attends religious service: Not on file    Active member of club or organization: Not on file    Attends meetings of clubs or organizations: Not on file    Relationship status: Not on file  . Intimate partner violence    Fear of current or ex partner: Not on file    Emotionally abused: Not on file    Physically abused: Not on file    Forced sexual activity: Not on file  Other Topics Concern  . Not on file  Social History Narrative   Retired Administrator, on disability. Former smoker. Living by himself, able to get around [facility].    Has a wife who checks on him but they don't live together sicne 2007   Quit drinking      FAMILY HISTORY: Family History  Problem Relation Age of Onset  . Venous thrombosis Mother        Dec. of blood clot   . Diabetes type II Mother   . AAA (abdominal aortic aneurysm) Mother   . Throat cancer Father        Deceased of throat cancer    ALLERGIES:  is allergic to morphine and related.  MEDICATIONS:  Current Facility-Administered Medications  Medication Dose Route Frequency Provider Last Rate Last Dose  . 0.9 %  sodium chloride infusion   Intravenous Continuous Focht, Jessica L, PA 10 mL/hr at 03/21/19 1430    . acetaminophen (TYLENOL) tablet 650 mg  650 mg Oral Q6H PRN Focht, Jessica L, PA   650 mg at 03/19/19 0236   Or  . acetaminophen (TYLENOL) suppository 650 mg  650 mg Rectal Q6H PRN Focht, Jessica L, PA      . albuterol (PROVENTIL) (2.5 MG/3ML) 0.083% nebulizer solution 2.5 mg  2.5 mg Inhalation Q6H PRN Focht, Jessica L, PA      . amLODipine (NORVASC) tablet 5 mg  5 mg Oral Daily Focht, Jessica L, PA   5 mg at 03/22/19 0930  . atorvastatin (LIPITOR) tablet 20 mg  20 mg Oral Daily Focht, Jessica L, PA   20 mg at 03/22/19 0930  . calcium carbonate (dosed in mg elemental calcium) suspension 500 mg of elemental calcium  500 mg of elemental calcium Oral Q6H PRN Focht, Jessica L, PA      . camphor-menthol (SARNA) lotion 1 application  1 application Topical J8A PRN Focht, Jessica L, PA       And  . hydrOXYzine (ATARAX/VISTARIL) tablet 25 mg  25 mg Oral Q8H PRN Focht, Jessica L, PA      . carvedilol (COREG) tablet 25 mg  25 mg Oral BID WC Focht, Jessica L, PA  25 mg at 03/22/19 0930  . Chlorhexidine Gluconate Cloth 2 % PADS 6 each  6 each Topical Daily Kalman Drape, PA   6 each at 03/22/19 6578  . docusate sodium (COLACE) capsule 100 mg  100 mg Oral BID Focht, Jessica L, PA   100 mg at 03/22/19 0930  . docusate sodium (ENEMEEZ) enema 283 mg  1 enema Rectal PRN Focht, Jessica L, PA      . feeding supplement (BOOST / RESOURCE BREEZE) liquid 1 Container  1 Container  Oral TID BM Focht, Jessica L, PA   1 Container at 03/20/19 1325  . folic acid (FOLVITE) tablet 1 mg  1 mg Oral Daily Focht, Jessica L, PA   1 mg at 03/22/19 0930  . gabapentin (NEURONTIN) capsule 300 mg  300 mg Oral TID Focht, Jessica L, PA   300 mg at 03/22/19 0930  . HYDROmorphone (DILAUDID) injection 1 mg  1 mg Intravenous Q4H PRN Focht, Jessica L, PA   1 mg at 03/22/19 1406  . HYDROmorphone (DILAUDID) tablet 1 mg  1 mg Oral Q3H PRN Focht, Jessica L, PA   1 mg at 03/20/19 1615  . insulin aspart (novoLOG) injection 0-9 Units  0-9 Units Subcutaneous TID WC Focht, Jessica L, PA   3 Units at 03/22/19 1318  . lidocaine (XYLOCAINE) 1 % (with pres) injection    PRN Corrie Mckusick, DO   5 mL at 03/17/19 2005  . metoCLOPramide (REGLAN) injection 10 mg  10 mg Intravenous Once Focht, Jessica L, PA      . mupirocin ointment (BACTROBAN) 2 % 1 application  1 application Topical BID Focht, Jessica L, PA   1 application at 46/96/29 0930  . ondansetron (ZOFRAN) tablet 4 mg  4 mg Oral Q6H PRN Focht, Jessica L, PA       Or  . ondansetron (ZOFRAN) injection 4 mg  4 mg Intravenous Q6H PRN Focht, Jessica L, PA   4 mg at 03/15/19 2143  . oxyCODONE (Oxy IR/ROXICODONE) immediate release tablet 10 mg  10 mg Oral Q6H PRN Ileana Roup, MD   10 mg at 03/22/19 0145  . pantoprazole (PROTONIX) EC tablet 40 mg  40 mg Oral Daily Focht, Jessica L, PA   40 mg at 03/22/19 0930  . piperacillin-tazobactam (ZOSYN) IVPB 2.25 g  2.25 g Intravenous Q8H Focht, Jessica L, PA 100 mL/hr at 03/22/19 0934 2.25 g at 03/22/19 0934  . sodium chloride flush (NS) 0.9 % injection 10-40 mL  10-40 mL Intracatheter Q12H Focht, Jessica L, PA   10 mL at 03/19/19 2237  . sodium chloride flush (NS) 0.9 % injection 10-40 mL  10-40 mL Intracatheter PRN Focht, Jessica L, PA      . sorbitol 70 % solution 30 mL  30 mL Oral PRN Focht, Jessica L, PA      . zolpidem (AMBIEN) tablet 5 mg  5 mg Oral QHS PRN Focht, Jessica L, PA        PHYSICAL  EXAMINATION:  Vitals:   03/22/19 0430 03/22/19 0930  BP: 140/75 (!) 144/78  Pulse: 76 68  Resp: 16 16  Temp: 98.1 F (36.7 C) 98 F (36.7 C)  SpO2: 97% 98%   Filed Weights   03/18/19 0800 03/21/19 1327  Weight: 141 lb 1.5 oz (64 kg) 141 lb 1.5 oz (64 kg)    GENERAL: alert, no distress, relatively comfortable lying in bed, thin  SKIN: skin color, texture, turgor are normal, no rashes or  significant lesions EYES: conjunctiva are pink and non-injected, sclera clear OROPHARYNX: no exudate, no erythema; lips, buccal mucosa, and tongue normal  NECK: supple, non-tender LYMPH:  no palpable lymphadenopathy in the cervical LUNGS: clear to auscultation with normal breathing effort HEART: regular rate & rhythm, no murmurs, no lower extremity edema ABDOMEN: soft, non-tender, non-distended, normal bowel sounds Musculoskeletal: no cyanosis of digits and no clubbing  PSYCH: alert & oriented x 3, soft speech  LABORATORY DATA:  I have reviewed the data as listed Lab Results  Component Value Date   WBC 21.2 (H) 03/22/2019   HGB 10.1 (L) 03/22/2019   HCT 30.8 (L) 03/22/2019   MCV 79.2 (L) 03/22/2019   PLT 480 (H) 03/22/2019   Lab Results  Component Value Date   NA 139 03/21/2019   K 4.2 03/21/2019   CL 109 03/21/2019   CO2 20 (L) 03/21/2019    RADIOGRAPHIC STUDIES: I have personally reviewed the radiological images as listed and agreed with the findings in the report. Ct Abdomen Pelvis Wo Contrast  Result Date: 03/15/2019 CLINICAL DATA:  Emesis, right lower quadrant pain, history of colon carcinoma EXAM: CT ABDOMEN AND PELVIS WITHOUT CONTRAST TECHNIQUE: Multidetector CT imaging of the abdomen and pelvis was performed following the standard protocol without IV contrast. COMPARISON:  02/09/2018 and previous FINDINGS: Lower chest: Linear scarring or subsegmental atelectasis posteriorly in the lower lobes as before. No pleural or pericardial effusion. Hepatobiliary: No focal liver  abnormality is seen. Status post cholecystectomy. No biliary dilatation. Pancreas: Unremarkable. No pancreatic ductal dilatation or surrounding inflammatory changes. Spleen: Small with coarse calcifications as before. Adrenals/Urinary Tract: Adrenal glands are unremarkable. Kidneys are normal, without renal calculi, focal lesion, or hydronephrosis. Bladder is unremarkable. Stomach/Bowel: Stomach decompressed. Small bowel decompressed. Marked wall thickening involving the terminal ileum and cecum without obstruction. Mild adjacent inflammatory/edematous changes. Regional adenopathy measured up to 3.2 cm , some nodes containing calcifications. The remainder of the colon is unremarkable. Vascular/Lymphatic: Right lower quadrant and central mesenteric adenopathy as above. No retroperitoneal or pelvic adenopathy. No significant vascular pathology identified. Reproductive: Uterus and bilateral adnexa are unremarkable. Other: Bilateral pelvic phleboliths.  No ascites.  No free air. Musculoskeletal: Extensive mottled lytic/sclerotic appearance of all visualized bones, probably related to sickle cell disease, present on studies dating back to 11/04/2016. No fracture. IMPRESSION: 1. Marked progression of wall thickening in the cecum, now also involving terminal ileum, with progressive regional adenopathy, suggesting progression of colon carcinoma. Cannot exclude superimposed typhlitis given the clinical presentation and regional inflammatory/edematous change. 2. Chronic changes of sickle cell disease in visualized bones and spleen. Electronically Signed   By: Lucrezia Europe M.D.   On: 03/15/2019 13:59   Ir Fluoro Guide Cv Line Right  Result Date: 03/18/2019 INDICATION: 68 year old male referred for central line placement EXAM: IMAGE GUIDED PLACEMENT OF CENTRAL VENOUS CATHETER MEDICATIONS: NONE ANESTHESIA/SEDATION: NONE FLUOROSCOPY TIME:  Fluoroscopy Time: 0 minutes 12 seconds (1 mGy). COMPLICATIONS: NONE PROCEDURE: Informed  written consent was obtained from the patient after a thorough discussion of the procedural risks, benefits and alternatives. All questions were addressed. Maximal Sterile Barrier Technique was utilized including caps, mask, sterile gowns, sterile gloves, sterile drape, hand hygiene and skin antiseptic. A timeout was performed prior to the initiation of the procedure. Patient was positioned supine position on fluoroscopy table. The right neck was prepped and draped in the usual sterile fashion. 1% lidocaine was used for local anesthesia. Using ultrasound guidance, micropuncture access was performed at the right IJ. Once we  confirmed wire position, we measured and internal length to 2 vertebral bodies below the carina. The catheter was modified on the back table and then placed through the peel-away sheath. Final image was stored. Aspiration was confirmed at the catheter site. Catheter was sutured in position and a sterile bandage was placed. Patient tolerated the procedure well and remained hemodynamically stable throughout. No complications were encountered and no significant blood loss. IMPRESSION: Status post image guided placement of right IJ central venous catheter. Signed, Dulcy Fanny. Dellia Nims, RPVI [facility] Electronically Signed   By: Corrie Mckusick D.O.   On: 03/18/2019 08:28   Ir US Guide Vasc Access Right  Result Date: 03/18/2019 INDICATION: 68 year old male referred for central line placement EXAM: IMAGE GUIDED PLACEMENT OF CENTRAL VENOUS CATHETER MEDICATIONS: NONE ANESTHESIA/SEDATION: NONE FLUOROSCOPY TIME:  Fluoroscopy Time: 0 minutes 12 seconds (1 mGy). COMPLICATIONS: NONE PROCEDURE: Informed written consent was obtained from the patient after a thorough discussion of the procedural risks, benefits and alternatives. All questions were addressed. Maximal Sterile Barrier Technique was utilized including caps, mask, sterile gowns, sterile  gloves, sterile drape, hand hygiene and skin antiseptic. A timeout was performed prior to the initiation of the procedure. Patient was positioned supine position on fluoroscopy table. The right neck was prepped and draped in the usual sterile fashion. 1% lidocaine was used for local anesthesia. Using ultrasound guidance, micropuncture access was performed at the right IJ. Once we confirmed wire position, we measured and internal length to 2 vertebral bodies below the carina. The catheter was modified on the back table and then placed through the peel-away sheath. Final image was stored. Aspiration was confirmed at the catheter site. Catheter was sutured in position and a sterile bandage was placed. Patient tolerated the procedure well and remained hemodynamically stable throughout. No complications were encountered and no significant blood loss. IMPRESSION: Status post image guided placement of right IJ central venous catheter. Signed, Dulcy Fanny. Dellia Nims, RPVI [facility] Electronically Signed   By: Corrie Mckusick D.O.   On: 03/18/2019 08:28    PATHOLOGY: I have reviewed the pathology reports as documented in the oncologist history.

## 2019-03-22 NOTE — Evaluation (Signed)
Physical Therapy Evaluation Patient Details Name: [name] MRN: [id number] DOB: 20-Sep-1950 Today's Date: 03/22/2019   History of Present Illness  Pt is a 68 y/o male admitted secondary to n/v and RLQ abdominal pain. Imaging indicates recurrent colon cancer in the cecum/TI. Colonoscopy on 10/13 showed malignant tumor in the ascending colon and in the cecum. path showed poorly differentiated carcinoma. Pt underwent exploratory laparotomy surgery on 03/21/19; however, intraoperative findings were that tumor was unresectable. Consults to oncology and palliative were recommended. PMH including but not limited to colon cancer s/p some type of colon resection in 1982, CKD stage 5, DM, HTN, CHF and cocaine abuse.    Clinical Impression  Pt presented supine in bed with HOB elevated, awake and willing to participate in therapy session. Prior to admission, pt reported that he ambulated with use of a cane and was independent with ADLs. Pt lives alone in a single level home with a few steps to enter. He indicated that his ex-wife could stay with him upon d/c home; however, he did report that she will be beginning chemotherapy treatments on Monday (03/24/19). At the time of evaluation, pt very limited secondary to pain, fatigue and persistent dizziness upon upright sitting at EOB. Pt declining further mobility at this time. Pt would continue to benefit from skilled physical therapy services at this time while admitted and after d/c to address the below listed limitations in order to improve overall safety and independence with functional mobility.     Follow Up Recommendations SNF;Supervision/Assistance - 24 hour;Other (comment)(vs home with ?Hospice or HHPT; but would need 24/7 A) - depending on pt's goals and wishes moving forward    Equipment Recommendations  3in1 (PT)    Recommendations for Other Services Other (comment)(Palliative Consult)     Precautions / Restrictions Precautions Precautions:  Fall Precaution Comments: foley; abdominal incision Restrictions Weight Bearing Restrictions: No      Mobility  Bed Mobility Overal bed mobility: Needs Assistance Bed Mobility: Rolling;Sidelying to Sit Rolling: Min guard Sidelying to sit: Min guard       General bed mobility comments: increased time and effort, use of bed rails, min guard for safety  Transfers                 General transfer comment: pt deferring secondary to persistent dizziness and then receiving a phone call from his daughter in which he answered and wanted to remain sitting upright at EOB  Ambulation/Gait                Stairs            Wheelchair Mobility    Modified Rankin (Stroke Patients Only)       Balance Overall balance assessment: Needs assistance Sitting-balance support: Feet supported Sitting balance-Leahy Scale: Good                                       Pertinent Vitals/Pain Pain Assessment: Faces Faces Pain Scale: Hurts whole lot Pain Location: abdomen Pain Descriptors / Indicators: Grimacing;Guarding;Sore Pain Intervention(s): Repositioned;Monitored during session;Patient requesting pain meds-RN notified;RN gave pain meds during session    Home Living Family/patient expects to be discharged to:: Private residence Living Arrangements: Alone Available Help at Discharge: Family;Friend(s);Available PRN/intermittently   Home Access: Stairs to enter Entrance Stairs-Rails: Right Entrance Stairs-Number of Steps: 3 Home Layout: One level Home Equipment: Walker - 2 wheels;Cane - single point;Grab  bars - tub/shower      Prior Function Level of Independence: Independent with assistive device(s)         Comments: ambulates with a cane     Hand Dominance        Extremity/Trunk Assessment   Upper Extremity Assessment Upper Extremity Assessment: Generalized weakness    Lower Extremity Assessment Lower Extremity Assessment:  Generalized weakness    Cervical / Trunk Assessment Cervical / Trunk Assessment: Other exceptions Cervical / Trunk Exceptions: large abdominal midline incision  Communication   Communication: HOH  Cognition Arousal/Alertness: Awake/alert Behavior During Therapy: WFL for tasks assessed/performed Overall Cognitive Status: Impaired/Different from baseline Area of Impairment: Memory;Following commands;Safety/judgement;Problem solving                     Memory: Decreased short-term memory Following Commands: Follows one step commands inconsistently;Follows one step commands with increased time Safety/Judgement: Decreased awareness of deficits;Decreased awareness of safety   Problem Solving: Slow processing;Difficulty sequencing;Requires verbal cues        General Comments      Exercises     Assessment/Plan    PT Assessment Patient needs continued PT services  PT Problem List Decreased strength;Decreased activity tolerance;Decreased balance;Decreased mobility;Decreased coordination;Decreased cognition;Decreased knowledge of use of DME;Decreased safety awareness;Decreased knowledge of precautions;Pain       PT Treatment Interventions DME instruction;Gait training;Stair training;Functional mobility training;Therapeutic activities;Balance training;Therapeutic exercise;Neuromuscular re-education;Patient/family education    PT Goals (Current goals can be found in the Care Plan section)  Acute Rehab PT Goals Patient Stated Goal: to find out what else can be done to treat his cancer PT Goal Formulation: With patient Time For Goal Achievement: 04/05/19 Potential to Achieve Goals: Fair    Frequency Min 3X/week   Barriers to discharge Decreased caregiver support pt reporting that his ex-wife can stay with him; however, pt also stating that his ex-wife will begin her chemo treatments on Monday (03/24/19)    Co-evaluation               AM-PAC PT "6 Clicks" Mobility   Outcome Measure Help needed turning from your back to your side while in a flat bed without using bedrails?: None Help needed moving from lying on your back to sitting on the side of a flat bed without using bedrails?: None Help needed moving to and from a bed to a chair (including a wheelchair)?: A Little Help needed standing up from a chair using your arms (e.g., wheelchair or bedside chair)?: A Little Help needed to walk in hospital room?: A Little Help needed climbing 3-5 steps with a railing? : A Lot 6 Click Score: 19    End of Session   Activity Tolerance: Patient limited by fatigue;Patient limited by pain Patient left: in bed;with call bell/phone within reach;with bed alarm set;Other (comment)(sitting EOB) Nurse Communication: Mobility status PT Visit Diagnosis: Other abnormalities of gait and mobility (R26.89);Pain Pain - part of body: (abdomen)    Time: 1610-9604 PT Time Calculation (min) (ACUTE ONLY): 28 min   Charges:   PT Evaluation $PT Eval Moderate Complexity: 1 Mod PT Treatments $Therapeutic Activity: 8-22 mins        Sherie Don, PT, DPT  Acute Rehabilitation Services Pager [phone] Office [place] 03/22/2019, 12:10 PM

## 2019-03-22 NOTE — Progress Notes (Signed)
[facility] PROGRESS NOTE  [name] GDJ:[id number] DOB: 16-Feb-1951 DOA: 03/15/2019 PCP: Nolene Ebbs, MD  Brief summary   [name] is a 68 y.o. male with medical history significant of sickle cell anemia (SS disease); ESRD not on HD; HTN; HLD; DM; and colon CA presenting with n/v.  He had a vocal cord lesion removed with CO2 laser excision during microlaryngoscopy on 10/7.  He went home and then started vomiting the evening of 10.7.  No vomiting prior to surgery.  It would come and go.  He has been losing a lot of weight and so has been drinking Boost.  Unintentional weight loss of about 50 pounds in the last 4-5 months.  He started having night sweats recently.  No LAD.  Slight diarrhea after the surgery, thought it was related to the anesthesia.  Normal BMs other than post-operatively.  Stools have been black because he has been taking iron.  He got 4 units of blood a week or two before the surgery, that increased his Hgb from 2.5 up to 8.9.  He has not yet started HD, still making urine.  His ex-wife has been caring for him for the last 3 weeks.  His colon cancer was 20 years ago.  He has never had a colonoscopy in over 25 years.  He was last admitted 8/13-15 for symptomatic anemia from CKD and given IV iron and Epogen.  Upon arrival to ED, patient had c/o abdominal pain and vomiting.  Had laryngeal biopsy a few days ago.  WBC 40k.  CT read as progression of colon CA at cecum and TI with ?typhlitis.  Patient was admitted under hospitalist service with GI and surgery consultation.  Patient underwent colonoscopy on 03/18/2019 which showed cecum and ascending colon mass which was biopsied.  Patient's hemoglobin dropped from 7.026.5 on 03/15/2019 and he received 1 unit of PRBC transfusion but then again it dropped to 6.5 so he received second unit of transfusion on 03/17/2019.  General surgery consulted cardiology and patient was cleared for surgery from cardiac standpoint.   Palliative medicine was also consulted per general surgery recommendations.  Patient decided to proceed with surgery.  Nephrology consulted vascular surgery for AV graft which was placed on 03/20/2019.  Patient then underwent exploratory laparotomy by general surgery on 03/21/2019 however his mass was unresectable.  Assessment/Plan:  Colon cancer. Patient with remote h/o colon cancer (records not apparently available) without subsequent follow-up, ex-wife reports no colonoscopy in 25 years -Presented with RLQ abdominal pain, and n/v; also with 50 pounds unintentional weight loss in the last 4-5 months and recent night sweats -Imaging indicates recurrent colon cancer in the cecum/TI. This appears to be at least stage 3 with positive LN, No obstruction on CT. CEA level-elevated.  Patient underwent colonoscopy on 03/18/2019 which showed cecum and ascending colon mass which was biopsied.  Patient has now decided to proceed with surgery.  Cardiology has cleared the patient for surgery from cardiac standpoint stating he will be high risk candidate due to other medical problems.  Patient was going to have palliative right hemicolectomy and underwent exploratory laparotomy by general surgery on 03/21/2019 however his mass was unresectable so he was closed back.  Patient now wants all sorts of chemotherapy and radiation and wants me to consult oncology.  I have consulted oncology and spoke to Dr. [name].  Stage 5 CKD -Patient has been preparing for HD but has not yet started. Nephrology prn order set utilized.  Seen by nephrology.  His renal function is at his baseline with some improvement today.  Nephrology had consulted vascular surgery for temporary HD and fistula in anticipation for hemodialysis.  Patient underwent left upper arm AV fistula placed on 03/20/2019.  Continues to have good urine output with a stable creatinine.  Defer further management to nephrology.  Acute blood loss anemia on chronic anemia  secondary to colon cancer/lower GI bleed vs sickle cell anemia: -Likely multifactorial including h/o sickle cell disease and stage 5 CKD but most likely due to his recurrence of colon cancer.  Received 1 unit of PRBC on 03/16/2019 and 1 unit again on 03/17/2019.  Hemoglobin has remained stable in mid 8s.  Watch daily.  HTN -Blood pressure controlled.  Continue Norvasc, Coreg  Chronic diastolic CHF -02/3234 echo with preserved EF and grade 1 diastolic dysfunction -Despite stage 5 CKD, he does not appear to be volume overloaded at this time. -If he becomes volume overloaded, the likely treatment would be HD.  DM -Hemoglobin A1c 6.9 on 03/15/2019.  Hold Onglyza. Cover with sensitive-scale SSI.  Blood sugar have started to climb now.  Will continue on SSI monitor for now.  HLD  -Continue Lipitor  Cocaine abuse -Patient denies use since July, but was positive at the time of last admission in August. -UDS negative this time.  Code Status: DNR Family Communication: Plan of care discussed with patient.  He had no further questions.  He is going to talk to his family about further plan of care. Disposition Plan: remains inpatient    Consultants:  Surgery  GI  Cardiology  Vascular surgery  Oncology consulted 03/22/2019  Procedures:  Colonoscopy on 03/16/2089  Left upper arm AV graft placed on 03/20/2019  Antibiotics: Anti-infectives (From admission, onward)   Start     Dose/Rate Route Frequency Ordered Stop   03/21/19 1545  cefoTEtan (CEFOTAN) 2 g in sodium chloride 0.9 % 100 mL IVPB     2 g 200 mL/hr over 30 Minutes Intravenous To Surgery 03/21/19 1542 03/21/19 1603   03/21/19 1515  piperacillin-tazobactam (ZOSYN) IVPB 2.25 g     2.25 g 100 mL/hr over 30 Minutes Intravenous To Surgery 03/21/19 1504 03/21/19 1532   03/16/19 0000  piperacillin-tazobactam (ZOSYN) IVPB 2.25 g     2.25 g 100 mL/hr over 30 Minutes Intravenous Every 8 hours 03/15/19 1527     03/15/19 1430   piperacillin-tazobactam (ZOSYN) IVPB 3.375 g     3.375 g 12.5 mL/hr over 240 Minutes Intravenous  Once 03/15/19 1423 03/15/19 1915       (indicate start date, and stop date if known)  HPI/Subjective: Patient seen and examined.  Looks comfortable but continues to complain of 7 out of 10 abdominal pain.  Disappointed that his mass was unresectable.  Wants me to consult oncology for possible chemotherapy and radiation options.  Objective: Vitals:   03/22/19 0102 03/22/19 0430  BP: 135/73 140/75  Pulse: 72 76  Resp: 16 16  Temp: 98.3 F (36.8 C) 98.1 F (36.7 C)  SpO2: 97% 97%    Intake/Output Summary (Last 24 hours) at 03/22/2019 0913 Last data filed at 03/22/2019 0500 Gross per 24 hour  Intake 955 ml  Output 875 ml  Net 80 ml   Filed Weights   03/18/19 0800 03/21/19 1327  Weight: 64 kg 64 kg    Exam:  General exam: Appears calm and comfortable  Respiratory system: Clear to auscultation. Respiratory effort normal. Cardiovascular system: S1 & S2 heard, RRR. No JVD, murmurs,  rubs, gallops or clicks. No pedal edema. Gastrointestinal system: Abdomen is nondistended, firm and generalized tender. No organomegaly or masses felt. Normal bowel sounds heard. Central nervous system: Alert and oriented. No focal neurological deficits. Extremities: Symmetric 5 x 5 power. Skin: No rashes, lesions or ulcers.  Psychiatry: Judgement and insight appear poor. Mood & affect appropriate.     Data Reviewed: Basic Metabolic Panel: Recent Labs  Lab 03/17/19 0346 03/18/19 0418 03/19/19 1026 03/20/19 0433 03/21/19 0402  NA 138 140 141 139 139  K 4.4 4.0 4.2 4.3 4.2  CL 107 105 109 107 109  CO2 18* 20* 21* 20* 20*  GLUCOSE 175* 165* 126* 143* 135*  BUN 58* 50* 42* 39* 37*  CREATININE 3.76* 3.56* 3.33* 3.08* 3.20*  CALCIUM 9.2 9.2 9.2 9.1 8.8*  PHOS  --  4.0  --   --   --    Liver Function Tests: Recent Labs  Lab 03/15/19 1204 03/18/19 0418  AST 16  --   ALT 10  --    ALKPHOS 85  --   BILITOT 0.5  --   PROT 7.9  --   ALBUMIN 2.5* 2.0*   Recent Labs  Lab 03/15/19 1204  LIPASE 51   No results for input(s): AMMONIA in the last 168 hours. CBC: Recent Labs  Lab 03/18/19 0418 03/19/19 1026 03/20/19 0433 03/21/19 0402 03/22/19 0323  WBC 31.2* 24.5* 20.2* 17.0* 21.2*  NEUTROABS  --  16.2* 12.8* 10.0* 19.3*  HGB 8.1* 8.5* 8.6* 7.7* 10.1*  HCT 24.6* 26.6* 26.0* 24.2* 30.8*  MCV 76.4* 77.3* 76.7* 78.6* 79.2*  PLT 623* 630* 578* 474* 480*   Cardiac Enzymes: No results for input(s): CKTOTAL, CKMB, CKMBINDEX, TROPONINI in the last 168 hours. BNP (last 3 results) Recent Labs    01/16/19 1820  BNP 1,036.2*    ProBNP (last 3 results) No results for input(s): PROBNP in the last 8760 hours.  CBG: Recent Labs  Lab 03/21/19 1119 03/21/19 1258 03/21/19 1648 03/21/19 2117 03/22/19 0746  GLUCAP 80 79 112* 118* 219*    Recent Results (from the past 240 hour(s))  SARS CORONAVIRUS 2 (TAT 6-24 HRS) Nasopharyngeal Nasopharyngeal Swab     Status: None   Collection Time: 03/15/19  4:28 PM   Specimen: Nasopharyngeal Swab  Result Value Ref Range Status   SARS Coronavirus 2 NEGATIVE NEGATIVE Final    Comment: (NOTE) SARS-CoV-2 target nucleic acids are NOT DETECTED. The SARS-CoV-2 RNA is generally detectable in upper and lower respiratory specimens during the acute phase of infection. Negative results do not preclude SARS-CoV-2 infection, do not rule out co-infections with other pathogens, and should not be used as the sole basis for treatment or other patient management decisions. Negative results must be combined with clinical observations, patient history, and epidemiological information. The expected result is Negative. Fact Sheet for Patients: SugarRoll.be Fact Sheet for Healthcare Providers: https://www.woods-mathews.com/ This test is not yet approved or cleared by the [place] FDA and  has  been authorized for detection and/or diagnosis of SARS-CoV-2 by FDA under an Emergency Use Authorization (EUA). This EUA will remain  in effect (meaning this test can be used) for the duration of the COVID-19 declaration under Section 56 4(b)(1) of the Act, 21 U.S.C. section 360bbb-3(b)(1), unless the authorization is terminated or revoked sooner. Performed at [facility], [place] [address] [place], [place] [address]   Surgical pcr screen     Status: Abnormal   Collection Time: 03/20/19  1:16 AM  Specimen: Nasal Mucosa; Nasal Swab  Result Value Ref Range Status   MRSA, PCR NEGATIVE NEGATIVE Final   Staphylococcus aureus POSITIVE (A) NEGATIVE Final    Comment: (NOTE) The Xpert SA Assay (FDA approved for NASAL specimens in patients 30 years of age and older), is one component of a comprehensive surveillance program. It is not intended to diagnose infection nor to guide or monitor treatment. Performed at [facility], [place] [address], [place], [place] [address]      Studies: No results found.  Scheduled Meds: . amLODipine  5 mg Oral Daily  . atorvastatin  20 mg Oral Daily  . carvedilol  25 mg Oral BID WC  . Chlorhexidine Gluconate Cloth  6 each Topical Daily  . docusate sodium  100 mg Oral BID  . feeding supplement  1 Container Oral TID BM  . folic acid  1 mg Oral Daily  . gabapentin  300 mg Oral TID  . insulin aspart  0-9 Units Subcutaneous TID WC  . metoCLOPramide (REGLAN) injection  10 mg Intravenous Once  . mupirocin ointment  1 application Topical BID  . pantoprazole  40 mg Oral Daily  . sodium chloride flush  10-40 mL Intracatheter Q12H   Continuous Infusions: . sodium chloride 10 mL/hr at 03/21/19 1430  . piperacillin-tazobactam (ZOSYN)  IV 2.25 g (03/22/19 0011)    Principal Problem:   Colon cancer ([place]) Active Problems:   Hypertension   Type 2 diabetes mellitus with renal complication (HCC)   CKD (chronic kidney disease) stage 5, GFR less than 15  ml/min (HCC)   Anemia   Malignant neoplasm of cecum ([place])  Time spent: 30 minutes  [facility] Pager [phone]. If 7PM-7AM, please contact night-coverage at [email or web address], password [facility] 03/22/2019, 9:13 AM  LOS: 7 days

## 2019-03-22 NOTE — Progress Notes (Signed)
Patient ID: [name], male   DOB: 02-05-1951, 68 y.o.   MRN: [id number] S: Ex lap revealed that mass was unresectable and no surgery was performed.  [name] is a little upset about findings and is not sure what is next. O:BP (!) 144/78 (BP Location: Right Arm)   Pulse 68   Temp 98 F (36.7 C) (Oral)   Resp 16   Ht 6\' 1"  (1.854 m)   Wt 64 kg   SpO2 98%   BMI 18.62 kg/m   Intake/Output Summary (Last 24 hours) at 03/22/2019 1140 Last data filed at 03/22/2019 0900 Gross per 24 hour  Intake 1105 ml  Output 600 ml  Net 505 ml   Intake/Output: I/O last 3 completed shifts: In: 235 [P.O.:220; I.V.:170; Blood:315; IV Piggyback:250] Out: 361 [Urine:825; Blood:50]  Intake/Output this shift:  Total I/O In: 150 [P.O.:150] Out: -  Weight change:  Gen: NAD CVS: no rub Resp: cta Abd: +BS, soft, mildly tender Ext: no edema, LUE AVG +T/B  Recent Labs  Lab 03/15/19 1204 03/16/19 0459 03/17/19 0346 03/18/19 0418 03/19/19 1026 03/20/19 0433 03/21/19 0402  NA 133* 136 138 140 141 139 139  K 5.1 5.0 4.4 4.0 4.2 4.3 4.2  CL 101 103 107 105 109 107 109  CO2 21* 21* 18* 20* 21* 20* 20*  GLUCOSE 160* 189* 175* 165* 126* 143* 135*  BUN 56* 55* 58* 50* 42* 39* 37*  CREATININE 3.24* 3.38* 3.76* 3.56* 3.33* 3.08* 3.20*  ALBUMIN 2.5*  --   --  2.0*  --   --   --   CALCIUM 9.3 9.2 9.2 9.2 9.2 9.1 8.8*  PHOS  --   --   --  4.0  --   --   --   AST 16  --   --   --   --   --   --   ALT 10  --   --   --   --   --   --    Liver Function Tests: Recent Labs  Lab 03/15/19 1204 03/18/19 0418  AST 16  --   ALT 10  --   ALKPHOS 85  --   BILITOT 0.5  --   PROT 7.9  --   ALBUMIN 2.5* 2.0*   Recent Labs  Lab 03/15/19 1204  LIPASE 51   No results for input(s): AMMONIA in the last 168 hours. CBC: Recent Labs  Lab 03/18/19 0418  03/19/19 1026 03/20/19 0433 03/21/19 0402 03/22/19 0323  WBC 31.2*  --  24.5* 20.2* 17.0* 21.2*  NEUTROABS  --    < > 16.2* 12.8* 10.0* 19.3*  HGB 8.1*   --  8.5* 8.6* 7.7* 10.1*  HCT 24.6*  --  26.6* 26.0* 24.2* 30.8*  MCV 76.4*  --  77.3* 76.7* 78.6* 79.2*  PLT 623*  --  630* 578* 474* 480*   < > = values in this interval not displayed.   Cardiac Enzymes: No results for input(s): CKTOTAL, CKMB, CKMBINDEX, TROPONINI in the last 168 hours. CBG: Recent Labs  Lab 03/21/19 1258 03/21/19 1648 03/21/19 2117 03/22/19 0746 03/22/19 1134  GLUCAP 79 112* 118* 219* 203*    Iron Studies: No results for input(s): IRON, TIBC, TRANSFERRIN, FERRITIN in the last 72 hours. Studies/Results: No results found. Marland Kitchen amLODipine  5 mg Oral Daily  . atorvastatin  20 mg Oral Daily  . carvedilol  25 mg Oral BID WC  . Chlorhexidine Gluconate Cloth  6  each Topical Daily  . docusate sodium  100 mg Oral BID  . feeding supplement  1 Container Oral TID BM  . folic acid  1 mg Oral Daily  . gabapentin  300 mg Oral TID  . insulin aspart  0-9 Units Subcutaneous TID WC  . metoCLOPramide (REGLAN) injection  10 mg Intravenous Once  . mupirocin ointment  1 application Topical BID  . pantoprazole  40 mg Oral Daily  . sodium chloride flush  10-40 mL Intracatheter Q12H    BMET    Component Value Date/Time   NA 139 03/21/2019 0402   K 4.2 03/21/2019 0402   CL 109 03/21/2019 0402   CO2 20 (L) 03/21/2019 0402   GLUCOSE 135 (H) 03/21/2019 0402   BUN 37 (H) 03/21/2019 0402   CREATININE 3.20 (H) 03/21/2019 0402   CALCIUM 8.8 (L) 03/21/2019 0402   GFRNONAA 19 (L) 03/21/2019 0402   GFRAA 22 (L) 03/21/2019 0402   CBC    Component Value Date/Time   WBC 21.2 (H) 03/22/2019 0323   RBC 3.89 (L) 03/22/2019 0323   HGB 10.1 (L) 03/22/2019 0323   HCT 30.8 (L) 03/22/2019 0323   HCT 14.3 (LL) 11/07/2016 1518   PLT 480 (H) 03/22/2019 0323   MCV 79.2 (L) 03/22/2019 0323   MCH 26.0 03/22/2019 0323   MCHC 32.8 03/22/2019 0323   RDW 24.8 (H) 03/22/2019 0323   LYMPHSABS 1.4 03/22/2019 0323   MONOABS 0.4 03/22/2019 0323   EOSABS 0.0 03/22/2019 0323   BASOSABS 0.0  03/22/2019 0323     Assessment/Plan:  1. 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 He had an avg placed 2 years ago but it has clotted and he has been referred for new access, however he declined surgery. Now with recurrent colon cancer. He still is considering dialysis if/when he needs it. 1. Appreciate Dr. Luther Parody assistance with LUE AVG placed 03/20/19. 2. No indication for HD at this time andCris at his baseline. 3. Need to have a goals of care meeting with he and his family especially if he is not a surgical candidate for his cancer. 2. Recurrent colon cancer-seen by [facility] and exp lap revealed that mass was unresectable.  Awaiting Oncology and palliative care consults. 1. Now DNR but may need to transition to comfort measures pending evaluation by Oncology 3. Anemia- SCA and CKD. Transfuse prn. No ESA due to progressive cancer. 4. Chronic diastolic chf- stable 5. DM- per primary svc 6. Cocaine abuse- UDS negative this admission.  7. Disposition- pt currently DNR and will discuss possible transition to hospice pending colon cancer workup.  Donetta Potts, MD [employer] [phone]

## 2019-03-23 ENCOUNTER — Inpatient Hospital Stay (HOSPITAL_COMMUNITY): Payer: Medicare HMO

## 2019-03-23 DIAGNOSIS — C182 Malignant neoplasm of ascending colon: Secondary | ICD-10-CM | POA: Diagnosis not present

## 2019-03-23 LAB — RENAL FUNCTION PANEL
Albumin: 2.2 g/dL — ABNORMAL LOW (ref 3.5–5.0)
Anion gap: 11 (ref 5–15)
BUN: 47 mg/dL — ABNORMAL HIGH (ref 8–23)
CO2: 20 mmol/L — ABNORMAL LOW (ref 22–32)
Calcium: 9.4 mg/dL (ref 8.9–10.3)
Chloride: 107 mmol/L (ref 98–111)
Creatinine, Ser: 3.56 mg/dL — ABNORMAL HIGH (ref 0.61–1.24)
GFR calc Af Amer: 19 mL/min — ABNORMAL LOW (ref >60)
GFR calc non Af Amer: 16 mL/min — ABNORMAL LOW (ref >60)
Glucose, Bld: 203 mg/dL — ABNORMAL HIGH (ref 70–99)
Phosphorus: 4.3 mg/dL (ref 2.5–4.6)
Potassium: 4.8 mmol/L (ref 3.5–5.1)
Sodium: 138 mmol/L (ref 135–145)

## 2019-03-23 LAB — CBC WITH DIFFERENTIAL/PLATELET
Abs Immature Granulocytes: 0.2 10*3/uL — ABNORMAL HIGH (ref 0.00–0.07)
Basophils Absolute: 0.1 10*3/uL (ref 0.0–0.1)
Basophils Relative: 0 %
Eosinophils Absolute: 0.1 10*3/uL (ref 0.0–0.5)
Eosinophils Relative: 0 %
HCT: 33.3 % — ABNORMAL LOW (ref 39.0–52.0)
Hemoglobin: 11 g/dL — ABNORMAL LOW (ref 13.0–17.0)
Immature Granulocytes: 1 %
Lymphocytes Relative: 7 %
Lymphs Abs: 2.3 10*3/uL (ref 0.7–4.0)
MCH: 26.1 pg (ref 26.0–34.0)
MCHC: 33 g/dL (ref 30.0–36.0)
MCV: 78.9 fL — ABNORMAL LOW (ref 80.0–100.0)
Monocytes Absolute: 3.2 10*3/uL — ABNORMAL HIGH (ref 0.1–1.0)
Monocytes Relative: 10 %
Neutro Abs: 27.1 10*3/uL — ABNORMAL HIGH (ref 1.7–7.7)
Neutrophils Relative %: 82 %
Platelets: 518 10*3/uL — ABNORMAL HIGH (ref 150–400)
RBC: 4.22 MIL/uL (ref 4.22–5.81)
RDW: 25.5 % — ABNORMAL HIGH (ref 11.5–15.5)
WBC: 33 10*3/uL — ABNORMAL HIGH (ref 4.0–10.5)
nRBC: 0.1 % (ref 0.0–0.2)

## 2019-03-23 LAB — GLUCOSE, CAPILLARY
Glucose-Capillary: 228 mg/dL — ABNORMAL HIGH (ref 70–99)
Glucose-Capillary: 164 mg/dL — ABNORMAL HIGH (ref 70–99)
Glucose-Capillary: 111 mg/dL — ABNORMAL HIGH (ref 70–99)
Glucose-Capillary: 134 mg/dL — ABNORMAL HIGH (ref 70–99)

## 2019-03-23 MED ORDER — HYDROMORPHONE HCL 1 MG/ML IJ SOLN
0.5000 mg | INTRAMUSCULAR | Status: DC | PRN
  Administered 2019-03-23 – 2019-03-28 (×29): 1 mg via INTRAVENOUS
  Filled 2019-03-23 (×29): qty 1

## 2019-03-23 MED ORDER — CEPASTAT 14.5 MG MT LOZG: 1.0000 | LOZENGE | OROMUCOSAL | Status: DC | PRN

## 2019-03-23 MED ORDER — MENTHOL 3 MG MT LOZG: 1.0000 | LOZENGE | OROMUCOSAL | Status: DC | PRN

## 2019-03-23 MED ORDER — PANTOPRAZOLE SODIUM 40 MG IV SOLR
40.0000 mg | INTRAVENOUS | Status: DC
  Administered 2019-03-23 – 2019-03-27 (×5): 40 mg via INTRAVENOUS
  Filled 2019-03-23 (×5): qty 40

## 2019-03-23 NOTE — Progress Notes (Signed)
Patient ID: KADE DEMICCO, male   DOB: 03/12/1951, 68 y.o.   MRN: [id number] S: complaining of abdominal pain this am O:BP (!) 145/95 (BP Location: Right Arm)   Pulse 75   Temp 98.4 F (36.9 C) (Oral)   Resp 19   Ht 6\' 1"  (1.854 m)   Wt 64 kg   SpO2 98%   BMI 18.62 kg/m   Intake/Output Summary (Last 24 hours) at 03/23/2019 1041 Last data filed at 03/23/2019 0900 Gross per 24 hour  Intake 490 ml  Output 350 ml  Net 140 ml   Intake/Output: I/O last 3 completed shifts: In: 1030 [P.O.:740; I.V.:290] Out: 700 [Urine:700]  Intake/Output this shift:  No intake/output data recorded. Weight change:  Gen: mild distress, cachectic  CVS: no rub Resp: cta Abd: +BS, soft, + tender to palpation Ext: no edema, LUE AVG +T/B  Recent Labs  Lab 03/17/19 0346 03/18/19 0418 03/19/19 1026 03/20/19 0433 03/21/19 0402 03/23/19 0326  NA 138 140 141 139 139 138  K 4.4 4.0 4.2 4.3 4.2 4.8  CL 107 105 109 107 109 107  CO2 18* 20* 21* 20* 20* 20*  GLUCOSE 175* 165* 126* 143* 135* 203*  BUN 58* 50* 42* 39* 37* 47*  CREATININE 3.76* 3.56* 3.33* 3.08* 3.20* 3.56*  ALBUMIN  --  2.0*  --   --   --  2.2*  CALCIUM 9.2 9.2 9.2 9.1 8.8* 9.4  PHOS  --  4.0  --   --   --  4.3   Liver Function Tests: Recent Labs  Lab 03/18/19 0418 03/23/19 0326  ALBUMIN 2.0* 2.2*   No results for input(s): LIPASE, AMYLASE in the last 168 hours. No results for input(s): AMMONIA in the last 168 hours. CBC: Recent Labs  Lab 03/19/19 1026 03/20/19 0433 03/21/19 0402 03/22/19 0323 03/23/19 0326  WBC 24.5* 20.2* 17.0* 21.2* 33.0*  NEUTROABS 16.2* 12.8* 10.0* 19.3* 27.1*  HGB 8.5* 8.6* 7.7* 10.1* 11.0*  HCT 26.6* 26.0* 24.2* 30.8* 33.3*  MCV 77.3* 76.7* 78.6* 79.2* 78.9*  PLT 630* 578* 474* 480* 518*   Cardiac Enzymes: No results for input(s): CKTOTAL, CKMB, CKMBINDEX, TROPONINI in the last 168 hours. CBG: Recent Labs  Lab 03/22/19 0746 03/22/19 1134 03/22/19 1632 03/22/19 2124 03/23/19 0806   GLUCAP 219* 203* 211* 135* 228*    Iron Studies: No results for input(s): IRON, TIBC, TRANSFERRIN, FERRITIN in the last 72 hours. Studies/Results: Dg Abd Portable 1v  Result Date: 03/23/2019 CLINICAL DATA:  NG tube placement; exploratory laparotomy performed 03/21/2019 EXAM: PORTABLE ABDOMEN - 1 VIEW COMPARISON:  CT abdomen pelvis-03/15/2019 FINDINGS: Enteric tube tip and side port project over the left expected location of mid body of the stomach. There is moderate gaseous distension of several loops of small bowel with index loop of small bowel within the right upper abdominal quadrant measuring approximately 3.9 cm in diameter. This finding is associated with a paucity of distal colonic gas within the imaged upper abdomen. Lucency overlying the midline of the upper abdomen likely represents pneumoperitoneum as a sequela of recent postoperative state. Limited visualization of the lower thorax demonstrates bibasilar heterogeneous opacities IMPRESSION: 1. Enteric tube tip and side port project over the expected location of mid body of the stomach. 2. Gaseous distention of the small bowel with paucity of colonic gas within the imaged upper abdomen, potentially representative of ileus though could be seen in the setting of a developing small-bowel obstruction. Clinical correlation is advised. 3. Suspected pneumoperitoneum within the  midline of the upper abdomen, presumably the sequela of patient's recent postoperative state. Electronically Signed   By: Sandi Mariscal M.D.   On: 03/23/2019 09:10   . amLODipine  5 mg Oral Daily  . atorvastatin  20 mg Oral Daily  . carvedilol  25 mg Oral BID WC  . Chlorhexidine Gluconate Cloth  6 each Topical Daily  . folic acid  1 mg Oral Daily  . gabapentin  300 mg Oral TID  . insulin aspart  0-9 Units Subcutaneous TID WC  . mupirocin ointment  1 application Topical BID  . pantoprazole (PROTONIX) IV  40 mg Intravenous Q24H  . sodium chloride flush  10-40 mL  Intracatheter Q12H    BMET    Component Value Date/Time   NA 138 03/23/2019 0326   K 4.8 03/23/2019 0326   CL 107 03/23/2019 0326   CO2 20 (L) 03/23/2019 0326   GLUCOSE 203 (H) 03/23/2019 0326   BUN 47 (H) 03/23/2019 0326   CREATININE 3.56 (H) 03/23/2019 0326   CALCIUM 9.4 03/23/2019 0326   GFRNONAA 16 (L) 03/23/2019 0326   GFRAA 19 (L) 03/23/2019 0326   CBC    Component Value Date/Time   WBC 33.0 (H) 03/23/2019 0326   RBC 4.22 03/23/2019 0326   HGB 11.0 (L) 03/23/2019 0326   HCT 33.3 (L) 03/23/2019 0326   HCT 14.3 (LL) 11/07/2016 1518   PLT 518 (H) 03/23/2019 0326   MCV 78.9 (L) 03/23/2019 0326   MCH 26.1 03/23/2019 0326   MCHC 33.0 03/23/2019 0326   RDW 25.5 (H) 03/23/2019 0326   LYMPHSABS 2.3 03/23/2019 0326   MONOABS 3.2 (H) 03/23/2019 0326   EOSABS 0.1 03/23/2019 0326   BASOSABS 0.1 03/23/2019 0326    Assessment/Plan:  1. 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 He had an avg placed 2 years ago but it has clotted and he has been referred for new access, however he declined surgery. Now with recurrent colon cancer. He still is considering dialysis if/when he needs it. 1. Appreciate Dr. Luther Parody assistance with LUE AVG placed 03/20/19. 2. No indication for HD at this time andCris at his baseline. 3. Need to have a goals of care meeting with he and his family especially if he is not a surgical candidate for his cancer. 2. Recurrent colon cancer-seen by [facility] and exp lap revealed that mass was unresectable.  Awaiting Oncology and palliative care consults. 1. Now DNR but may need to transition to comfort measures pending evaluation by Oncology and Palliative care 2. For CT scan of chest w/o contrast to r/o mets 3. Abdominal pain- concerning for ileus, pt for ngt placement 4. Anemia- SCA and CKD. Transfuse prn. No ESA due to progressive cancer. 5. Chronic diastolic chf- stable 6. DM- per primary svc 7. Cocaine abuse- UDS negative  this admission.  8. Disposition- pt currently DNR and will discuss possible transition to hospice pending colon cancer workup.   Donetta Potts, MD [employer] [phone]

## 2019-03-23 NOTE — Plan of Care (Signed)
  Problem: Education: Goal: Knowledge of General Education information will improve Description: Including pain rating scale, medication(s)/side effects and non-pharmacologic comfort measures Outcome: Progressing   Problem: Clinical Measurements: Goal: Diagnostic test results will improve Outcome: Not Progressing   Problem: Activity: Goal: Risk for activity intolerance will decrease Outcome: Progressing

## 2019-03-23 NOTE — Progress Notes (Signed)
[facility] PROGRESS NOTE  Kweli Grassel Stjulien FFM:[id number] DOB: [birth year] DOA: 03/15/2019 PCP: Nolene Ebbs, MD  Brief summary   DEADRICK STIDD is a 68 y.o. male with medical history significant of sickle cell anemia (SS disease); ESRD not on HD; HTN; HLD; DM; and colon CA presenting with n/v.  He had a vocal cord lesion removed with CO2 laser excision during microlaryngoscopy on 10/7.  He went home and then started vomiting the evening of 10.7.  No vomiting prior to surgery.  It would come and go.  He has been losing a lot of weight and so has been drinking Boost.  Unintentional weight loss of about 50 pounds in the last 4-5 months.  He started having night sweats recently.  No LAD.  Slight diarrhea after the surgery, thought it was related to the anesthesia.  Normal BMs other than post-operatively.  Stools have been black because he has been taking iron.  He got 4 units of blood a week or two before the surgery, that increased his Hgb from 2.5 up to 8.9.  He has not yet started HD, still making urine.  His ex-wife has been caring for him for the last 3 weeks.  His colon cancer was 20 years ago.  He has never had a colonoscopy in over 25 years.  He was last admitted 8/13-15 for symptomatic anemia from CKD and given IV iron and Epogen.  Upon arrival to ED, patient had c/o abdominal pain and vomiting.  Had laryngeal biopsy a few days ago.  WBC 40k.  CT read as progression of colon CA at cecum and TI with ?typhlitis.  Patient was admitted under hospitalist service with GI and surgery consultation.  Patient underwent colonoscopy on 03/18/2019 which showed cecum and ascending colon mass which was biopsied.  Patient's hemoglobin dropped from 7.026.5 on 03/15/2019 and he received 1 unit of PRBC transfusion but then again it dropped to 6.5 so he received second unit of transfusion on 03/17/2019.  General surgery consulted cardiology and patient was cleared for surgery from cardiac standpoint.   Palliative medicine was also consulted per general surgery recommendations.  Patient decided to proceed with surgery.  Nephrology consulted vascular surgery for AV graft which was placed on 03/20/2019.  Patient then underwent exploratory laparotomy by general surgery on 03/21/2019 however his mass was unresectable.  Assessment/Plan:  Colon cancer. Patient with remote h/o colon cancer (records not apparently available) without subsequent follow-up, ex-wife reports no colonoscopy in 25 years -Presented with RLQ abdominal pain, and n/v; also with 50 pounds unintentional weight loss in the last 4-5 months and recent night sweats -Imaging indicates recurrent colon cancer in the cecum/TI. This appears to be at least stage 3 with positive LN, No obstruction on CT. CEA level-elevated.  Patient underwent colonoscopy on 03/18/2019 which showed cecum and ascending colon mass which was biopsied.  Patient has now decided to proceed with surgery.  Cardiology has cleared the patient for surgery from cardiac standpoint stating he will be high risk candidate due to other medical problems.  Patient was going to have palliative right hemicolectomy and underwent exploratory laparotomy by general surgery on 03/21/2019 however his mass was unresectable so he was closed back.  Patient now wants all sorts of chemotherapy and radiation and wants me to consult oncology.  Seen by Dr. [name].  They are completing full work-up for staging.  Patient to discuss with family about final plans.  Stage 5 CKD -Patient has been preparing for HD but has  not yet started. Nephrology prn order set utilized.  Seen by nephrology and they consulted vascular surgery for temporary HD and fistula in anticipation for hemodialysis.  Patient underwent left upper arm AV fistula placed on 03/20/2019.  Continues to have fair urine output with a stable creatinine.  Defer further management to nephrology.  Acute blood loss anemia on chronic anemia secondary to  colon cancer/lower GI bleed vs sickle cell anemia: -Likely multifactorial including h/o sickle cell disease and stage 5 CKD but most likely due to his recurrence of colon cancer.  Received 1 unit of PRBC on 03/16/2019 and 1 unit again on 03/17/2019.  Hemoglobin has remained stable in mid 8s.  Watch daily.  HTN -Blood pressure controlled.  Continue Norvasc, Coreg  Chronic diastolic CHF -75/1700 echo with preserved EF and grade 1 diastolic dysfunction -Despite stage 5 CKD, he does not appear to be volume overloaded at this time. -If he becomes volume overloaded, the likely treatment would be HD.  DM -Hemoglobin A1c 6.9 on 03/15/2019.  Hold Onglyza.  Blood sugar slightly elevated however he is still n.p.o. so we will continue him on SSI.  HLD  -Continue Lipitor  Cocaine abuse -Patient denies use since July, but was positive at the time of last admission in August. -UDS negative this time.  Postoperative ileus: Had some nausea today.  Abdominal x-ray shows possible ileus.  NG tube placed.  Seen and managed by surgery.  Code Status: DNR Family Communication: Plan of care discussed with patient.  He had no further questions.  He is going to talk to his family about further plan of care. Disposition Plan: remains inpatient    Consultants:  Surgery  GI  Cardiology  Vascular surgery  Oncology consulted 03/22/2019  Procedures:  Colonoscopy on 03/16/2089  Left upper arm AV graft placed on 03/20/2019  Antibiotics: Anti-infectives (From admission, onward)   Start     Dose/Rate Route Frequency Ordered Stop   03/21/19 1545  cefoTEtan (CEFOTAN) 2 g in sodium chloride 0.9 % 100 mL IVPB     2 g 200 mL/hr over 30 Minutes Intravenous To Surgery 03/21/19 1542 03/21/19 1603   03/21/19 1515  piperacillin-tazobactam (ZOSYN) IVPB 2.25 g     2.25 g 100 mL/hr over 30 Minutes Intravenous To Surgery 03/21/19 1504 03/21/19 1532   03/16/19 0000  piperacillin-tazobactam (ZOSYN) IVPB 2.25  g     2.25 g 100 mL/hr over 30 Minutes Intravenous Every 8 hours 03/15/19 1527     03/15/19 1430  piperacillin-tazobactam (ZOSYN) IVPB 3.375 g     3.375 g 12.5 mL/hr over 240 Minutes Intravenous  Once 03/15/19 1423 03/15/19 1915       (indicate start date, and stop date if known)  HPI/Subjective: Patient seen and examined.  He was nauseous and had vomited just prior to my arrival.  Still had some abdominal pain.  Objective: Vitals:   03/22/19 2121 03/23/19 0449  BP: (!) 146/79 (!) 145/95  Pulse: (!) 58 75  Resp: 16 19  Temp: 98 F (36.7 C) 98.4 F (36.9 C)  SpO2: 98% 98%    Intake/Output Summary (Last 24 hours) at 03/23/2019 0833 Last data filed at 03/23/2019 0500 Gross per 24 hour  Intake 640 ml  Output 350 ml  Net 290 ml   Filed Weights   03/18/19 0800 03/21/19 1327  Weight: 64 kg 64 kg    Exam:  General exam: Appears sick. Respiratory system: Clear to auscultation. Respiratory effort normal. Cardiovascular system: S1 & S2  heard, RRR. No JVD, murmurs, rubs, gallops or clicks. No pedal edema. Gastrointestinal system: Abdomen is nondistended, firm and tender, no organomegaly or masses felt.  Diminished bowel sounds Central nervous system: Alert and oriented. No focal neurological deficits. Extremities: Symmetric 5 x 5 power. Skin: No rashes, lesions or ulcers.  Psychiatry: Judgement and insight appear normal. Mood & affect appropriate.    Data Reviewed: Basic Metabolic Panel: Recent Labs  Lab 03/18/19 0418 03/19/19 1026 03/20/19 0433 03/21/19 0402 03/23/19 0326  NA 140 141 139 139 138  K 4.0 4.2 4.3 4.2 4.8  CL 105 109 107 109 107  CO2 20* 21* 20* 20* 20*  GLUCOSE 165* 126* 143* 135* 203*  BUN 50* 42* 39* 37* 47*  CREATININE 3.56* 3.33* 3.08* 3.20* 3.56*  CALCIUM 9.2 9.2 9.1 8.8* 9.4  PHOS 4.0  --   --   --  4.3   Liver Function Tests: Recent Labs  Lab 03/18/19 0418 03/23/19 0326  ALBUMIN 2.0* 2.2*   No results for input(s): LIPASE, AMYLASE  in the last 168 hours. No results for input(s): AMMONIA in the last 168 hours. CBC: Recent Labs  Lab 03/19/19 1026 03/20/19 0433 03/21/19 0402 03/22/19 0323 03/23/19 0326  WBC 24.5* 20.2* 17.0* 21.2* 33.0*  NEUTROABS 16.2* 12.8* 10.0* 19.3* 27.1*  HGB 8.5* 8.6* 7.7* 10.1* 11.0*  HCT 26.6* 26.0* 24.2* 30.8* 33.3*  MCV 77.3* 76.7* 78.6* 79.2* 78.9*  PLT 630* 578* 474* 480* 518*   Cardiac Enzymes: No results for input(s): CKTOTAL, CKMB, CKMBINDEX, TROPONINI in the last 168 hours. BNP (last 3 results) Recent Labs    01/16/19 1820  BNP 1,036.2*    ProBNP (last 3 results) No results for input(s): PROBNP in the last 8760 hours.  CBG: Recent Labs  Lab 03/22/19 0746 03/22/19 1134 03/22/19 1632 03/22/19 2124 03/23/19 0806  GLUCAP 219* 203* 211* 135* 228*    Recent Results (from the past 240 hour(s))  SARS CORONAVIRUS 2 (TAT 6-24 HRS) Nasopharyngeal Nasopharyngeal Swab     Status: None   Collection Time: 03/15/19  4:28 PM   Specimen: Nasopharyngeal Swab  Result Value Ref Range Status   SARS Coronavirus 2 NEGATIVE NEGATIVE Final    Comment: (NOTE) SARS-CoV-2 target nucleic acids are NOT DETECTED. The SARS-CoV-2 RNA is generally detectable in upper and lower respiratory specimens during the acute phase of infection. Negative results do not preclude SARS-CoV-2 infection, do not rule out co-infections with other pathogens, and should not be used as the sole basis for treatment or other patient management decisions. Negative results must be combined with clinical observations, patient history, and epidemiological information. The expected result is Negative. Fact Sheet for Patients: SugarRoll.be Fact Sheet for Healthcare Providers: https://www.woods-mathews.com/ This test is not yet approved or cleared by the [place] FDA and  has been authorized for detection and/or diagnosis of SARS-CoV-2 by FDA under an Emergency Use  Authorization (EUA). This EUA will remain  in effect (meaning this test can be used) for the duration of the COVID-19 declaration under Section 56 4(b)(1) of the Act, 21 U.S.C. section 360bbb-3(b)(1), unless the authorization is terminated or revoked sooner. Performed at [facility], [place] [address], [place], [place] [address]   Surgical pcr screen     Status: Abnormal   Collection Time: 03/20/19  1:16 AM   Specimen: Nasal Mucosa; Nasal Swab  Result Value Ref Range Status   MRSA, PCR NEGATIVE NEGATIVE Final   Staphylococcus aureus POSITIVE (A) NEGATIVE Final  Comment: (NOTE) The Xpert SA Assay (FDA approved for NASAL specimens in patients 29 years of age and older), is one component of a comprehensive surveillance program. It is not intended to diagnose infection nor to guide or monitor treatment. Performed at [facility], [place] [address], [place], [place] [address]      Studies: No results found.  Scheduled Meds: . amLODipine  5 mg Oral Daily  . atorvastatin  20 mg Oral Daily  . carvedilol  25 mg Oral BID WC  . Chlorhexidine Gluconate Cloth  6 each Topical Daily  . folic acid  1 mg Oral Daily  . gabapentin  300 mg Oral TID  . insulin aspart  0-9 Units Subcutaneous TID WC  . mupirocin ointment  1 application Topical BID  . pantoprazole (PROTONIX) IV  40 mg Intravenous Q24H  . sodium chloride flush  10-40 mL Intracatheter Q12H   Continuous Infusions: . sodium chloride 10 mL/hr at 03/21/19 1430  . piperacillin-tazobactam (ZOSYN)  IV 2.25 g (03/23/19 4888)    Principal Problem:   Colon cancer ([place]) Active Problems:   Hypertension   Type 2 diabetes mellitus with renal complication (HCC)   CKD (chronic kidney disease) stage 5, GFR less than 15 ml/min (HCC)   Anemia   Malignant neoplasm of cecum (HCC)   Goals of care, counseling/discussion  Time spent: 27 minutes  [facility] Pager [phone]. If 7PM-7AM, please contact  night-coverage at [email or web address], password [facility] 03/23/2019, 8:33 AM  LOS: 8 days

## 2019-03-23 NOTE — Progress Notes (Signed)
RN notified by nursing student that patient pulled out his NG tube. In speaking with the patient he informed the nurse that ", The tube is doing more harm to my stomach than good. I am fine without it." Patient is refusing to have the NGT re-inserted. Surgery PA made aware.

## 2019-03-23 NOTE — Progress Notes (Signed)
[facility]/Trauma Progress Note  2 Days Post-Op   Assessment/Plan Hx of colon cancer s/p some type of colon resection in 1982 Hx of sickle cell anemia ESRD not on HD- S/P L upper arm HD graft, Dr. Donnetta Hutching, 10/15 DM HTN HLD CHF Cocaine abuse Anemia-Hgb 6.5 this a.m. transfusion as needed per medicine DNR  Cecal Mass - poorly differentiated carcinoma -colonoscopy10/13showedMalignant tumor in the ascending colon and in the cecum.path showedPoorly differentiated carcinoma - CEA 82.7 - s/p exploratory laparotomy, Dr. Dema Severin, 10/16 - Intraoperative findings were that tumor was unresectable - Recommend oncology and palliative care consult Post op ileus - dark emesis this am, NGT to LIWS, NPO  FEN:NPO, NGT to LIWS VTE: SCD's,chemical prophylaxis per medicine YQ:MVHQI 10/10>>WBC33.0, afebrile Foley:None Follow up:TBD     LOS: 8 days    Subjective: CC: abdominal pain N and V  Brown enteric looking emesis this am. Worse abdominal pain than yesterday. Pt did not drink much of his liquids yesterday per nurse.   Objective: Vital signs in last 24 hours: Temp:  [97.6 F (36.4 C)-98.4 F (36.9 C)] 98.4 F (36.9 C) (10/18 0449) Pulse Rate:  [58-75] 75 (10/18 0449) Resp:  [16-19] 19 (10/18 0449) BP: (143-146)/(78-95) 145/95 (10/18 0449) SpO2:  [98 %] 98 % (10/18 0449) Last BM Date: 03/18/19  Intake/Output from previous day: 10/17 0701 - 10/18 0700 In: 640 [P.O.:520; I.V.:120] Out: 350 [Urine:350] Intake/Output this shift: No intake/output data recorded.  PE: Gen: Alert, NAD, pleasant, cooperative Pulm:Rate andeffort normal Abd: Soft,ND, midline with staples covered in honeycomb dressing, moderate generalized TTP with guarding.  Skin: no rashes noted, warm and dry   Anti-infectives: Anti-infectives (From admission, onward)   Start     Dose/Rate Route Frequency Ordered Stop   03/21/19 1545  cefoTEtan (CEFOTAN) 2 g in sodium  chloride 0.9 % 100 mL IVPB     2 g 200 mL/hr over 30 Minutes Intravenous To Surgery 03/21/19 1542 03/21/19 1603   03/21/19 1515  piperacillin-tazobactam (ZOSYN) IVPB 2.25 g     2.25 g 100 mL/hr over 30 Minutes Intravenous To Surgery 03/21/19 1504 03/21/19 1532   03/16/19 0000  piperacillin-tazobactam (ZOSYN) IVPB 2.25 g     2.25 g 100 mL/hr over 30 Minutes Intravenous Every 8 hours 03/15/19 1527     03/15/19 1430  piperacillin-tazobactam (ZOSYN) IVPB 3.375 g     3.375 g 12.5 mL/hr over 240 Minutes Intravenous  Once 03/15/19 1423 03/15/19 1915      Lab Results:  Recent Labs    03/22/19 0323 03/23/19 0326  WBC 21.2* 33.0*  HGB 10.1* 11.0*  HCT 30.8* 33.3*  PLT 480* 518*   BMET Recent Labs    03/21/19 0402 03/23/19 0326  NA 139 138  K 4.2 4.8  CL 109 107  CO2 20* 20*  GLUCOSE 135* 203*  BUN 37* 47*  CREATININE 3.20* 3.56*  CALCIUM 8.8* 9.4   PT/INR No results for input(s): LABPROT, INR in the last 72 hours. CMP     Component Value Date/Time   NA 138 03/23/2019 0326   K 4.8 03/23/2019 0326   CL 107 03/23/2019 0326   CO2 20 (L) 03/23/2019 0326   GLUCOSE 203 (H) 03/23/2019 0326   BUN 47 (H) 03/23/2019 0326   CREATININE 3.56 (H) 03/23/2019 0326   CALCIUM 9.4 03/23/2019 0326   PROT 7.9 03/15/2019 1204   ALBUMIN 2.2 (L) 03/23/2019 0326   AST 16 03/15/2019 1204   ALT 10 03/15/2019 1204   ALKPHOS  85 03/15/2019 1204   BILITOT 0.5 03/15/2019 1204   GFRNONAA 16 (L) 03/23/2019 0326   GFRAA 19 (L) 03/23/2019 0326   Lipase     Component Value Date/Time   LIPASE 51 03/15/2019 1204    Studies/Results: No results found.   Kalman Drape, [facility] Pager [phone] Cristine Polio, & Friday 7:00am - 4:30pm Thursdays 7:00am -11:30am

## 2019-03-23 NOTE — Progress Notes (Signed)
175 mL of bilious fluid in NG canister.

## 2019-03-24 DIAGNOSIS — D473 Essential (hemorrhagic) thrombocythemia: Secondary | ICD-10-CM

## 2019-03-24 DIAGNOSIS — C182 Malignant neoplasm of ascending colon: Secondary | ICD-10-CM | POA: Diagnosis not present

## 2019-03-24 DIAGNOSIS — Z7189 Other specified counseling: Secondary | ICD-10-CM | POA: Diagnosis not present

## 2019-03-24 DIAGNOSIS — N185 Chronic kidney disease, stage 5: Secondary | ICD-10-CM | POA: Diagnosis not present

## 2019-03-24 DIAGNOSIS — D649 Anemia, unspecified: Secondary | ICD-10-CM | POA: Diagnosis not present

## 2019-03-24 DIAGNOSIS — D75839 Thrombocytosis, unspecified: Secondary | ICD-10-CM

## 2019-03-24 DIAGNOSIS — N186 End stage renal disease: Secondary | ICD-10-CM | POA: Diagnosis not present

## 2019-03-24 DIAGNOSIS — K37 Unspecified appendicitis: Secondary | ICD-10-CM | POA: Diagnosis not present

## 2019-03-24 LAB — CBC WITH DIFFERENTIAL/PLATELET
Abs Immature Granulocytes: 0.2 10*3/uL — ABNORMAL HIGH (ref 0.00–0.07)
Basophils Absolute: 0.2 10*3/uL — ABNORMAL HIGH (ref 0.0–0.1)
Basophils Relative: 1 %
Eosinophils Absolute: 0.6 10*3/uL — ABNORMAL HIGH (ref 0.0–0.5)
Eosinophils Relative: 2 %
HCT: 29.9 % — ABNORMAL LOW (ref 39.0–52.0)
Hemoglobin: 9.9 g/dL — ABNORMAL LOW (ref 13.0–17.0)
Immature Granulocytes: 1 %
Lymphocytes Relative: 10 %
Lymphs Abs: 2.9 10*3/uL (ref 0.7–4.0)
MCH: 26.7 pg (ref 26.0–34.0)
MCHC: 33.1 g/dL (ref 30.0–36.0)
MCV: 80.6 fL (ref 80.0–100.0)
Monocytes Absolute: 3 10*3/uL — ABNORMAL HIGH (ref 0.1–1.0)
Monocytes Relative: 11 %
Neutro Abs: 21.4 10*3/uL — ABNORMAL HIGH (ref 1.7–7.7)
Neutrophils Relative %: 75 %
Platelets: 429 10*3/uL — ABNORMAL HIGH (ref 150–400)
RBC: 3.71 MIL/uL — ABNORMAL LOW (ref 4.22–5.81)
RDW: 25.2 % — ABNORMAL HIGH (ref 11.5–15.5)
WBC: 28.2 10*3/uL — ABNORMAL HIGH (ref 4.0–10.5)
nRBC: 0.1 % (ref 0.0–0.2)

## 2019-03-24 LAB — GLUCOSE, CAPILLARY
Glucose-Capillary: 156 mg/dL — ABNORMAL HIGH (ref 70–99)
Glucose-Capillary: 161 mg/dL — ABNORMAL HIGH (ref 70–99)
Glucose-Capillary: 135 mg/dL — ABNORMAL HIGH (ref 70–99)

## 2019-03-24 LAB — RENAL FUNCTION PANEL
Albumin: 2.2 g/dL — ABNORMAL LOW (ref 3.5–5.0)
Anion gap: 10 (ref 5–15)
BUN: 58 mg/dL — ABNORMAL HIGH (ref 8–23)
CO2: 19 mmol/L — ABNORMAL LOW (ref 22–32)
Calcium: 9.4 mg/dL (ref 8.9–10.3)
Chloride: 107 mmol/L (ref 98–111)
Creatinine, Ser: 4.23 mg/dL — ABNORMAL HIGH (ref 0.61–1.24)
GFR calc Af Amer: 15 mL/min — ABNORMAL LOW (ref >60)
GFR calc non Af Amer: 13 mL/min — ABNORMAL LOW (ref >60)
Glucose, Bld: 165 mg/dL — ABNORMAL HIGH (ref 70–99)
Phosphorus: 4.2 mg/dL (ref 2.5–4.6)
Potassium: 4.7 mmol/L (ref 3.5–5.1)
Sodium: 136 mmol/L (ref 135–145)

## 2019-03-24 NOTE — Evaluation (Signed)
Occupational Therapy Evaluation Patient Details Name: [name] MRN: [id number] DOB: 1950/08/14 Today's Date: 03/24/2019    History of Present Illness Pt is a 68 y/o male admitted secondary to n/v and RLQ abdominal pain. Imaging indicates recurrent colon cancer in the cecum/TI. Colonoscopy on 10/13 showed malignant tumor in the ascending colon and in the cecum. path showed poorly differentiated carcinoma. Pt underwent exploratory laparotomy surgery on 03/21/19; however, intraoperative findings were that tumor was unresectable. Consults to oncology and palliative were recommended. PMH including but not limited to colon cancer s/p some type of colon resection in 1982, CKD stage 5, DM, HTN, CHF and cocaine abuse.   Clinical Impression   Pt reports he was walking with a cane and struggling to take care of himself and his home due to low endurance PTA. Pt presents with abdominal pain, impaired standing balance, generalized weakness and is easily fatigued. Pt requires set up to max assist for ADL. Abdominal pain interferes with ability to reach feet. He require min assist for mobility with RW. Cognition needs further assessment. Pt is having difficulty deciding how to proceed with treatment, asking for chaplain to visit, called chaplains office and notified RN.    Follow Up Recommendations  Home health OT;Supervision/Assistance - 24 hour(initially)    Equipment Recommendations  None recommended by OT    Recommendations for Other Services       Precautions / Restrictions Precautions Precautions: Fall Precaution Comments: abdominal pain Restrictions Weight Bearing Restrictions: No      Mobility Bed Mobility Overal bed mobility: Needs Assistance Bed Mobility: Sit to Supine       Sit to supine: Min assist   General bed mobility comments: returned to bed with assist for L LE  Transfers Overall transfer level: Needs assistance Equipment used: Rolling walker (2 wheeled) Transfers:  Sit to/from Stand Sit to Stand: Min assist         General transfer comment: steadying assist    Balance Overall balance assessment: Mild deficits observed, not formally tested;Needs assistance Sitting-balance support: Feet supported Sitting balance-Leahy Scale: Fair     Standing balance support: Bilateral upper extremity supported;During functional activity Standing balance-Leahy Scale: Poor Standing balance comment: no LOB                           ADL either performed or assessed with clinical judgement   ADL Overall ADL's : Needs assistance/impaired Eating/Feeding: NPO   Grooming: Wash/dry hands;Wash/dry face;Sitting;Set up   Upper Body Bathing: Minimal assistance;Sitting   Lower Body Bathing: Maximal assistance;Sit to/from stand   Upper Body Dressing : Set up;Sitting   Lower Body Dressing: Maximal assistance;Sit to/from stand   Toilet Transfer: Minimal assistance;Ambulation;BSC;RW   Toileting- Clothing Manipulation and Hygiene: Minimal assistance;Sit to/from stand       Functional mobility during ADLs: Minimal assistance;Rolling walker;Cueing for safety General ADL Comments: pt with poor endurance     Vision Patient Visual Report: No change from baseline       Perception     Praxis      Pertinent Vitals/Pain Pain Assessment: Faces Faces Pain Scale: Hurts even more Pain Location: abdomen with attempt to don socks Pain Descriptors / Indicators: Sore;Grimacing;Guarding Pain Intervention(s): Monitored during session;Repositioned;Patient requesting pain meds-RN notified     Hand Dominance Right   Extremity/Trunk Assessment Upper Extremity Assessment Upper Extremity Assessment: Overall WFL for tasks assessed   Lower Extremity Assessment Lower Extremity Assessment: Defer to PT evaluation   Cervical /  Trunk Assessment Cervical / Trunk Assessment: Other exceptions Cervical / Trunk Exceptions: large abdominal midline incision    Communication Communication Communication: HOH   Cognition Arousal/Alertness: Awake/alert Behavior During Therapy: Flat affect Overall Cognitive Status: No family/caregiver present to determine baseline cognitive functioning                               Problem Solving: Slow processing;Requires verbal cues     General Comments       Exercises     Shoulder Instructions      Home Living Family/patient expects to be discharged to:: Private residence Living Arrangements: Alone Available Help at Discharge: Family;Friend(s);Available PRN/intermittently Type of Home: Apartment Home Access: Stairs to enter Entrance Stairs-Number of Steps: 3 Entrance Stairs-Rails: Right Home Layout: One level     Bathroom Shower/Tub: Teacher, early years/pre: Handicapped height     Home Equipment: Environmental consultant - 2 wheels;Cane - single point;Grab bars - tub/shower;Bedside commode          Prior Functioning/Environment Level of Independence: Independent with assistive device(s)        Comments: ambulates with a cane        OT Problem List: Decreased activity tolerance;Impaired balance (sitting and/or standing);Decreased safety awareness;Decreased knowledge of use of DME or AE;Pain      OT Treatment/Interventions: Self-care/ADL training;DME and/or AE instruction;Therapeutic activities;Patient/family education;Balance training    OT Goals(Current goals can be found in the care plan section) Acute Rehab OT Goals Patient Stated Goal: to find out what else can be done to treat his cancer OT Goal Formulation: With patient Time For Goal Achievement: 04/07/19 Potential to Achieve Goals: Fair ADL Goals Pt Will Perform Grooming: (P) with supervision;standing Pt Will Perform Lower Body Bathing: (P) with supervision;with adaptive equipment;sit to/from stand Pt Will Perform Lower Body Dressing: (P) with supervision;with adaptive equipment;sit to/from stand Pt Will Transfer to  Toilet: (P) with supervision;ambulating;bedside commode Pt Will Perform Toileting - Clothing Manipulation and hygiene: (P) with supervision;sit to/from stand Additional ADL Goal #1: (P) Pt will state at least 3 energy conservation strategies as instructed.  OT Frequency: Min 2X/week   Barriers to D/C:            Co-evaluation              AM-PAC OT "6 Clicks" Daily Activity     Outcome Measure Help from another person eating meals?: None Help from another person taking care of personal grooming?: A Little Help from another person toileting, which includes using toliet, bedpan, or urinal?: A Little Help from another person bathing (including washing, rinsing, drying)?: A Lot Help from another person to put on and taking off regular upper body clothing?: None Help from another person to put on and taking off regular lower body clothing?: A Lot 6 Click Score: 18   End of Session Equipment Utilized During Treatment: Gait belt;Rolling walker Nurse Communication: Other (comment)(pt wants chaplain visit)  Activity Tolerance: Patient limited by fatigue;Patient limited by pain Patient left: in bed;with call bell/phone within reach;with bed alarm set;with nursing/sitter in room  OT Visit Diagnosis: Unsteadiness on feet (R26.81);Other abnormalities of gait and mobility (R26.89);Pain;Muscle weakness (generalized) (M62.81)                Time: 1230-1300 OT Time Calculation (min): 30 min Charges:  OT General Charges $OT Visit: 1 Visit OT Evaluation $OT Eval Moderate Complexity: 1 Mod OT Treatments $Self Care/Home Management :  8-22 mins  Malka So 03/24/2019, 2:49 PM  Nestor Lewandowsky, OTR/L Acute Rehabilitation Services Pager: [phone] Office: [phone]

## 2019-03-24 NOTE — Progress Notes (Signed)
Daily Progress Note   Patient Name: [name]       Date: 03/24/2019 DOB: 05-11-1951  Age: 68 y.o. MRN#: [id number] Attending Physician: Darliss Cheney, MD Primary Care Physician: Nolene Ebbs, MD Admit Date: 03/15/2019  Reason for Consultation/Follow-up: Establishing goals of care  Subjective: Patient sitting up in recliner watching tv. He denies pain at this time. Reports feeling better today but does states he has abdominal pain at times. Denies any shortness of breath. No family is at the bedside. He verbalizes his appreciation in seeing me as we met last week prior to surgery and had a lengthy goals of care discussion. He shares that now that he has had surgery and understand what he is dealing with it now comes down to where does he go from here.   He is able to provide me a brief summary of his understanding of his conversations with Oncology and Nephrology. He looks out the window and states "I know I am not strong enough and have several things going on that may keep me from having chemo. I say life is life. Sitting in a recliner or rocking chair and watching the birds go by and the leaves change, waiting until my day comes and being at peace sounds good to me, but not to my daughters!" Space and opportunity allowed for him to further share his feelings and elaborate on his statement regarding his daughters. He shares they would like to see him go through chemotherapy and have a chance at life if there is one. He shares his son sort of agrees with him and understands life is all about quality. He laughs stating "but he is outnumbered, he is the only boy and there are 4 girls!"   I used this opportunity to discuss with patient and bring focus back to what it is he would want for himself  as he is alert and able to express his needs and wishes. We discussed all though some times we chose to do things for others and endure the pain or turmoil without looking at our own needs and wishes. He verbalized understanding and appreciation of this discussion. He shares he doesn't want problems after he is gone between his children and he will not be at peace if he knows they are not at peace which is  why he chooses to lean towards the direction of what they would want for him. He states he knows that the doctors will not offer or provide treatments that would harm him stating "maybe it will take the option away and that will make them be accepting because there is no decision to be made except for where am I going to be until my last days!" I offered to meet and/or have a discussion with his children. He declined sharing they are not as receptive and he feel that information to his daughters at least should come from him unless it gets more serious and he can't speak with them. He shares then he will rely on his son to provide them information because he gets it.   I asked [name] what his hopes are for his health and care. He tears up and began looking out of the window. He shares his sadness of now being sick with cancer at the same time his wife is also battling lung cancer. He states "but I have to let that go, I can't control this and had no way of knowing I was sick!"   He states "I am hopeful that I could improve some and maybe have chemo and allow my daughters the peace of knowing we at least tried. I am hopeful that I can be on HD if I have too. If I don't do well at least I think they would be satisfied knowing we did something. If I can not have chemo treatments or HD then I am hopeful they will come to terms with this and be at peace. We can then enjoy life until God says it is time! I am ok in my soul either way, but until then I plan to take it day by day!" Support provided. Patient again  express his appreciation of our discussion. He states he feels better sitting up in the chair although he feels tired and weak. He states his "nose tube" came out yesterday and he does not want it replaced because it was uncomfortable.     Length of Stay: 9  Current Medications: Scheduled Meds:  . amLODipine  5 mg Oral Daily  . atorvastatin  20 mg Oral Daily  . carvedilol  25 mg Oral BID WC  . Chlorhexidine Gluconate Cloth  6 each Topical Daily  . folic acid  1 mg Oral Daily  . gabapentin  300 mg Oral TID  . insulin aspart  0-9 Units Subcutaneous TID WC  . mupirocin ointment  1 application Topical BID  . pantoprazole (PROTONIX) IV  40 mg Intravenous Q24H  . sodium chloride flush  10-40 mL Intracatheter Q12H    Continuous Infusions: . sodium chloride 10 mL/hr at 03/21/19 1430  . piperacillin-tazobactam (ZOSYN)  IV 2.25 g (03/24/19 1105)    PRN Meds: acetaminophen **OR** acetaminophen, albuterol, calcium carbonate (dosed in mg elemental calcium), camphor-menthol **AND** hydrOXYzine, docusate sodium, HYDROmorphone (DILAUDID) injection, lidocaine, menthol-cetylpyridinium, ondansetron **OR** ondansetron (ZOFRAN) IV, sodium chloride flush, sorbitol, zolpidem  Physical Exam         -awake, A&O x4, NAD -midline abdominal incision clean, dry, intact -diminished bilaterally -RRR -follows commands, mood appropriate   Vital Signs: BP (!) 158/87   Pulse 75   Temp 98.3 F (36.8 C) (Oral)   Resp 14   Ht '6\' 1"'  (1.854 m)   Wt 64 kg   SpO2 97%   BMI 18.62 kg/m  SpO2: SpO2: 97 % O2 Device: O2 Device: Room [employer]  O2 Flow Rate: O2 Flow Rate (L/min): 5 L/min  Intake/output summary:   Intake/Output Summary (Last 24 hours) at 03/24/2019 1208 Last data filed at 03/24/2019 0947 Gross per 24 hour  Intake 0 ml  Output 525 ml  Net -525 ml   LBM: Last BM Date: 03/21/19 Baseline Weight: Weight: 64 kg Most recent weight: Weight: 64 kg       Palliative Assessment/Data:      Patient  Active Problem List   Diagnosis Date Noted  . Thrombocytosis ([employer])   . Goals of care, counseling/discussion   . Malignant neoplasm of cecum ([place])   . Colon cancer ([place]) 03/15/2019  . Anemia 03/15/2019  . Congestive heart failure (CHF) ([place]) 01/17/2019  . Acute on chronic diastolic CHF (congestive heart failure) ([place]) 01/16/2019  . Hyponatremia 01/16/2019  . Sepsis ([place]) 02/08/2018  . Opiate overdose ([place]) 11/13/2017  . Acute urinary obstruction 03/29/2017  . Symptomatic anemia   . Hb-SS disease with crisis ([place]) 03/27/2017  . Sickle cell pain crisis ([place]) 01/23/2017  . Heart murmur 03/27/2016  . CKD (chronic kidney disease) stage 5, GFR less than 15 ml/min (HCC) 03/26/2016  . Cocaine abuse ([place]) 03/25/2016  . Renal failure (ARF), acute on chronic (HCC)   . Hb-SS disease without crisis ([place])   . Hyperkalemia, diminished renal excretion 09/03/2014  . Leukocytosis 07/28/2011  . LBBB (left bundle branch block) 07/28/2011  . Hypertension   . Type 2 diabetes mellitus with renal complication [facility])     Palliative Care Assessment & Plan   Recommendations/Plan:  DNR/DNI  Continue with current plan of care per medical team  Patient's goals are clear, he would like to take it day by day to see if he will show signs of improvement and ability to undergo chemotherapy and HD if needed. Although he is content with being comfortable until EOL he is agreeing for aggressive care to allow his daughters some peace. He understands that chemo and/or HD may not be an option and at this time not recommended due to poor performance/functional status and he is "content" with that. If he is unable to undergo tx options he would then wish to be kept comfortable and enjoy what moments he has left, with hopes of his 4 daughters support.   PMT will continue to support and follow.   Goals of Care and Additional Recommendations:  Limitations on Scope of Treatment: Full Scope Treatment  Code Status:     Code Status Orders  (From admission, onward)         Start     Ordered   03/15/19 1512  Do not attempt resuscitation (DNR)  Continuous    Question Answer Comment  In the event of cardiac or respiratory ARREST Do not call a "code blue"   In the event of cardiac or respiratory ARREST Do not perform Intubation, CPR, defibrillation or ACLS   In the event of cardiac or respiratory ARREST Use medication by any route, position, wound care, and other measures to relive pain and suffering. May use oxygen, suction and manual treatment of airway obstruction as needed for comfort.      03/15/19 1512        Code Status History    Date Active Date Inactive Code Status Order ID Comments User Context   01/16/2019 2209 01/18/2019 2019 Full Code [id number]  Vianne Bulls, MD Inpatient   02/08/2018 2141 02/12/2018 2242 Full Code [id number]  Elwyn Reach, MD ED   11/13/2017 2247 11/16/2017  1932 Full Code [id number]  Etta Quill, DO ED   03/27/2017 0436 04/02/2017 1942 Full Code [id number]  Toy Baker, MD Inpatient   01/24/2017 0224 01/26/2017 1818 Full Code [id number]  Reubin [name], MD Inpatient   01/24/2017 0224 01/24/2017 0224 Full Code [id number]  Reubin [name], MD Inpatient   12/27/2016 0600 12/29/2016 2212 Full Code [id number]  Rise Patience, MD ED   11/04/2016 2358 11/12/2016 1816 Full Code [id number]  Etta Quill, DO ED   06/02/2016 2315 06/06/2016 2045 Full Code [id number]  Etta Quill, DO ED   03/25/2016 0906 03/27/2016 2037 Full Code [id number]  Rondel Jumbo, PA-C ED   03/25/2016 0738 03/25/2016 0906 Full Code [id number]  Rondel Jumbo, PA-C ED   03/25/2016 0738 03/25/2016 0738 Full Code [id number]  Rondel Jumbo, PA-C ED   09/03/2014 0711 09/09/2014 1417 Full Code [id number]  Lavina Hamman, MD ED   04/14/2013 0247 04/18/2013 1642 Full Code [id number]  Toy Baker, MD Inpatient   04/09/2012 1953 04/15/2012 1307 Full Code [id number]  Renata Caprice, RN Inpatient    07/28/2011 0351 07/31/2011 1720 Full Code [id number]  Ethel Rana, RN Inpatient   Advance Care Planning Activity     Prognosis:  POOR  Discharge Planning:  To Be Determined  Care plan was discussed with patient.   Thank you for allowing the Palliative Medicine Team to assist in the care of this patient.  Total Time: 55 min.   Greater than 50%  of this time was spent counseling and coordinating care related to the above assessment and plan.  Alda Lea, AGPCNP-BC Palliative Medicine Team  Phone: [phone] Pager: [phone] Amion: Bjorn Pippin    Please contact Palliative Medicine Team phone at [phone] for questions and concerns.

## 2019-03-24 NOTE — Progress Notes (Addendum)
Patient ID: [name], male   DOB: 05/23/1951, 68 y.o.   MRN: [id number]  [name] Progress Note   Assessment/ Plan:   1.  Chronic kidney disease stage IV-V: Previously seen for ongoing renal care by Dr. Marval Todd and etiology of his chronic kidney disease is suspected to be multifactorial from diabetes, hypertension, history of cocaine use and sickle cell disease.  He is now status post left upper arm arteriovenous graft placement on 03/20/2019 after primary failure of essential access.  Based on my discussion with him today, he understands that chemotherapy for his colon cancer will be palliative but he wants to undertake it along with dialysis when indicated.  I will check his labs today to follow renal function/electrolytes.  Urine output charted appears to be in the oliguric range.  He is euvolemic on exam and does not have any florid uremic symptoms. 2.  Recurrent colon cancer: With recently discovered cecal mass and biopsy showing metastatic poorly differentiated adenocarcinoma of the ascending colon with metastases that is inoperable.  Seen by Dr. Maylon Todd of oncology and based on NCCN guidelines, palliative chemotherapy proposed with the caveat that he may be a marginal candidate for this as well.  Mr. [name] informs me that he discussed with his son and plans to undertake chemotherapy with the realization that it is not curative. 3.  Microcytic anemia: Multifactorial from chronic kidney disease/sickle cell and colon cancer.  Continue surveillance of iron stores for intravenous supplementation.  Unclear if risk-benefit analysis of ESA favors benefit. 4.  Postoperative ileus: Yesterday had NG tube placed and self removed by the patient for lack of perceived benefit.  Continues to complain of pain.  Leukocyte count trending down. 5.  Hypertension: Likely exacerbated by pain, will continue to follow to decide on need for antihypertensive therapy.  Subjective:   Complains of  continued abdominal pain.  Does not want NG tube reinserted.  He states that he is intending to pursue hemodialysis even with the realization that his colon cancer is likely going to be fatal.   Objective:   BP (!) 158/87   Pulse 75   Temp 98.3 F (36.8 C) (Oral)   Resp 14   Ht 6\' 1"  (1.854 m)   Wt 64 kg   SpO2 97%   BMI 18.62 kg/m   Intake/Output Summary (Last 24 hours) at 03/24/2019 0907 Last data filed at 03/23/2019 1351 Gross per 24 hour  Intake 0 ml  Output 325 ml  Net -325 ml   Weight change:   Physical Exam: Gen: Uncomfortable resting in bed CVS: Pulse regular rhythm, normal rate, S1 and S2 normal Resp: Clear to auscultation, no rales/rhonchi.  Right IJ PICC line. Abd: Mildly distended, guarding and tenderness present. Ext: No lower extremity edema.  Left upper arm AVG with palpable thrill and strong bruit.  Imaging: Dg Abd Portable 1v  Result Date: 03/23/2019 CLINICAL DATA:  NG tube placement; exploratory laparotomy performed 03/21/2019 EXAM: PORTABLE ABDOMEN - 1 VIEW COMPARISON:  CT abdomen pelvis-03/15/2019 FINDINGS: Enteric tube tip and side port project over the left expected location of mid body of the stomach. There is moderate gaseous distension of several loops of small bowel with index loop of small bowel within the right upper abdominal quadrant measuring approximately 3.9 cm in diameter. This finding is associated with a paucity of distal colonic gas within the imaged upper abdomen. Lucency overlying the midline of the upper abdomen likely represents pneumoperitoneum as a sequela of recent postoperative state.  Limited visualization of the lower thorax demonstrates bibasilar heterogeneous opacities IMPRESSION: 1. Enteric tube tip and side port project over the expected location of mid body of the stomach. 2. Gaseous distention of the small bowel with paucity of colonic gas within the imaged upper abdomen, potentially representative of ileus though could be seen in  the setting of a developing small-bowel obstruction. Clinical correlation is advised. 3. Suspected pneumoperitoneum within the midline of the upper abdomen, presumably the sequela of patient's recent postoperative state. Electronically Signed   By: [name] M.D.   On: 03/23/2019 09:10    Labs: BMET Recent Labs  Lab 03/18/19 0418 03/19/19 1026 03/20/19 0433 03/21/19 0402 03/23/19 0326  NA 140 141 139 139 138  K 4.0 4.2 4.3 4.2 4.8  CL 105 109 107 109 107  CO2 20* 21* 20* 20* 20*  GLUCOSE 165* 126* 143* 135* 203*  BUN 50* 42* 39* 37* 47*  CREATININE 3.56* 3.33* 3.08* 3.20* 3.56*  CALCIUM 9.2 9.2 9.1 8.8* 9.4  PHOS 4.0  --   --   --  4.3   CBC Recent Labs  Lab 03/21/19 0402 03/22/19 0323 03/23/19 0326 03/24/19 0351  WBC 17.0* 21.2* 33.0* 28.2*  NEUTROABS 10.0* 19.3* 27.1* 21.4*  HGB 7.7* 10.1* 11.0* 9.9*  HCT 24.2* 30.8* 33.3* 29.9*  MCV 78.6* 79.2* 78.9* 80.6  PLT 474* 480* 518* 429*    Medications:    . amLODipine  5 mg Oral Daily  . atorvastatin  20 mg Oral Daily  . carvedilol  25 mg Oral BID WC  . Chlorhexidine Gluconate Cloth  6 each Topical Daily  . folic acid  1 mg Oral Daily  . gabapentin  300 mg Oral TID  . insulin aspart  0-9 Units Subcutaneous TID WC  . mupirocin ointment  1 application Topical BID  . pantoprazole (PROTONIX) IV  40 mg Intravenous Q24H  . sodium chloride flush  10-40 mL Intracatheter Q12H   Justin Shiley, MD 03/24/2019, 9:07 AM

## 2019-03-24 NOTE — Progress Notes (Signed)
Visited patient by referral made by RN. Patient had just received pain medication. Patient is very concerned with life and family decisions that need to be made in light of his recent diagnosis. Will continue to follow up with spiritual care services.  Rev. [place].  Pager# [phone]

## 2019-03-24 NOTE — Progress Notes (Signed)
Pt requesting to speak with Chaplaincy services, referral made.

## 2019-03-24 NOTE — Progress Notes (Signed)
[facility] PROGRESS NOTE  [name] Life HUD:[id number] DOB: 1950-09-17 DOA: 03/15/2019 PCP: Nolene Ebbs, MD  Brief summary   [name] is a 68 y.o. male with medical history significant of sickle cell anemia (SS disease); ESRD not on HD; HTN; HLD; DM; and colon CA presenting with n/v.  He had a vocal cord lesion removed with CO2 laser excision during microlaryngoscopy on 10/7.  He went home and then started vomiting the evening of 10.7.  No vomiting prior to surgery.  It would come and go.  He has been losing a lot of weight and so has been drinking Boost.  Unintentional weight loss of about 50 pounds in the last 4-5 months.  He started having night sweats recently.  No LAD.  Slight diarrhea after the surgery, thought it was related to the anesthesia.  Normal BMs other than post-operatively.  Stools have been black because he has been taking iron.  He got 4 units of blood a week or two before the surgery, that increased his Hgb from 2.5 up to 8.9.  He has not yet started HD, still making urine.  His ex-wife has been caring for him for the last 3 weeks.  His colon cancer was 20 years ago.  He has never had a colonoscopy in over 25 years.  He was last admitted 8/13-15 for symptomatic anemia from CKD and given IV iron and Epogen.  Upon arrival to ED, patient had c/o abdominal pain and vomiting.  Had laryngeal biopsy a few days ago.  WBC 40k.  CT read as progression of colon CA at cecum and TI with ?typhlitis.  Patient was admitted under hospitalist service with GI and surgery consultation.  Patient underwent colonoscopy on 03/18/2019 which showed cecum and ascending colon mass which was biopsied.  Patient's hemoglobin dropped from 7.026.5 on 03/15/2019 and he received 1 unit of PRBC transfusion but then again it dropped to 6.5 so he received second unit of transfusion on 03/17/2019.  General surgery consulted cardiology and patient was cleared for surgery from cardiac standpoint.   Palliative medicine was also consulted per general surgery recommendations.  Patient decided to proceed with surgery.  Nephrology consulted vascular surgery for AV graft which was placed on 03/20/2019.  Patient then underwent exploratory laparotomy by general surgery on 03/21/2019 however his mass was unresectable.  Patient then expressed wishes to see oncology for consideration of radiation and chemotherapy.  Dr. [name] from oncology saw patient and he recommends against chemotherapy at this point in time due to several comorbidities that patient has.  Assessment/Plan:  Colon cancer. Patient with remote h/o colon cancer (records not apparently available) without subsequent follow-up, ex-wife reports no colonoscopy in 25 years -Presented with RLQ abdominal pain, and n/v; also with 50 pounds unintentional weight loss in the last 4-5 months and recent night sweats -Imaging indicates recurrent colon cancer in the cecum/TI. This appears to be at least stage 3 with positive LN, No obstruction on CT. CEA level-elevated.  Patient underwent colonoscopy on 03/18/2019 which showed cecum and ascending colon mass which was biopsied.  Patient has now decided to proceed with surgery.  Cardiology has cleared the patient for surgery from cardiac standpoint stating he will be high risk candidate due to other medical problems.  Patient was going to have palliative right hemicolectomy and underwent exploratory laparotomy by general surgery on 03/21/2019 however his mass was unresectable so he was closed back.  Patient now wants all sorts of chemotherapy and radiation and wants  me to consult oncology.  Seen by Dr. [name] who recommends against systemic therapy at this point in time due to his comorbidities and poor nutritional status.  Appreciate oncology help in his efforts to figure out final plan while also talking to his family members.  Stage 5 CKD -Patient has been preparing for HD but has not yet started. Nephrology prn  order set utilized.  Seen by nephrology and they consulted vascular surgery for temporary HD and fistula in anticipation for hemodialysis.  Patient underwent left upper arm AV fistula placed on 03/20/2019.  Continues to have fair urine output with a stable creatinine.  Defer further management to nephrology.  Acute blood loss anemia on chronic anemia secondary to colon cancer/lower GI bleed vs sickle cell anemia: -Likely multifactorial including h/o sickle cell disease and stage 5 CKD but most likely due to his recurrence of colon cancer.  Received 1 unit of PRBC on 03/16/2019 and 1 unit again on 03/17/2019.  Hemoglobin has remained stable in mid 8s.  Watch daily.  HTN -Blood pressure controlled.  Continue Norvasc, Coreg  Chronic diastolic CHF -76/7209 echo with preserved EF and grade 1 diastolic dysfunction -Despite stage 5 CKD, he does not appear to be volume overloaded at this time. -If he becomes volume overloaded, the likely treatment would be HD.  DM -Hemoglobin A1c 6.9 on 03/15/2019.  Hold Onglyza.  Blood sugar much better.  Continue SSI.    HLD  -Continue Lipitor  Cocaine abuse -Patient denies use since July, but was positive at the time of last admission in August. -UDS negative this time.  Postoperative ileus: Patient pulled out his NG tube yesterday and did not want another one.  He still has abdominal pain with some nausea but also passing flatus with no bowel movement.  General surgery following and appreciate their help.  They are starting him on clears today.  Code Status: DNR Family Communication: Plan of care discussed with patient.  He had no further questions.  He is going to talk to his family about further plan of care. Disposition Plan: remains inpatient    Consultants:  Surgery  GI  Cardiology  Vascular surgery  Oncology consulted 03/22/2019  Procedures:  Colonoscopy on 03/16/2089  Left upper arm AV graft placed on  03/20/2019  Antibiotics: Anti-infectives (From admission, onward)   Start     Dose/Rate Route Frequency Ordered Stop   03/21/19 1545  cefoTEtan (CEFOTAN) 2 g in sodium chloride 0.9 % 100 mL IVPB     2 g 200 mL/hr over 30 Minutes Intravenous To Surgery 03/21/19 1542 03/21/19 1603   03/21/19 1515  piperacillin-tazobactam (ZOSYN) IVPB 2.25 g     2.25 g 100 mL/hr over 30 Minutes Intravenous To Surgery 03/21/19 1504 03/21/19 1532   03/16/19 0000  piperacillin-tazobactam (ZOSYN) IVPB 2.25 g     2.25 g 100 mL/hr over 30 Minutes Intravenous Every 8 hours 03/15/19 1527     03/15/19 1430  piperacillin-tazobactam (ZOSYN) IVPB 3.375 g     3.375 g 12.5 mL/hr over 240 Minutes Intravenous  Once 03/15/19 1423 03/15/19 1915       (indicate start date, and stop date if known)  HPI/Subjective: Patient seen and examined.  Continues to have abdominal pain and intermittent nausea but no vomiting.  Passing flatus but no bowel movement.  Does not want another NG tube.   Objective: Vitals:   03/24/19 0441 03/24/19 0442  BP: (!) 158/82 (!) 158/87  Pulse: 74 75  Resp: 14  Temp: 98.3 F (36.8 C)   SpO2: 97% 97%    Intake/Output Summary (Last 24 hours) at 03/24/2019 0914 Last data filed at 03/23/2019 1351 Gross per 24 hour  Intake 0 ml  Output 325 ml  Net -325 ml   Filed Weights   03/18/19 0800 03/21/19 1327  Weight: 64 kg 64 kg    Exam:  General exam: Appears in pain and sick Respiratory system: Clear to auscultation. Respiratory effort normal. Cardiovascular system: S1 & S2 heard, RRR. No JVD, murmurs, rubs, gallops or clicks. No pedal edema. Gastrointestinal system: Abdomen is nondistended, firm and generalized tender. No organomegaly or masses felt. Normal bowel sounds heard. Central nervous system: Alert and oriented. No focal neurological deficits. Extremities: Symmetric 5 x 5 power. Skin: No rashes, lesions or ulcers.  Psychiatry: Judgement and insight appear normal. Mood &  affect appropriate.   Data Reviewed: Basic Metabolic Panel: Recent Labs  Lab 03/18/19 0418 03/19/19 1026 03/20/19 0433 03/21/19 0402 03/23/19 0326  NA 140 141 139 139 138  K 4.0 4.2 4.3 4.2 4.8  CL 105 109 107 109 107  CO2 20* 21* 20* 20* 20*  GLUCOSE 165* 126* 143* 135* 203*  BUN 50* 42* 39* 37* 47*  CREATININE 3.56* 3.33* 3.08* 3.20* 3.56*  CALCIUM 9.2 9.2 9.1 8.8* 9.4  PHOS 4.0  --   --   --  4.3   Liver Function Tests: Recent Labs  Lab 03/18/19 0418 03/23/19 0326  ALBUMIN 2.0* 2.2*   No results for input(s): LIPASE, AMYLASE in the last 168 hours. No results for input(s): AMMONIA in the last 168 hours. CBC: Recent Labs  Lab 03/20/19 0433 03/21/19 0402 03/22/19 0323 03/23/19 0326 03/24/19 0351  WBC 20.2* 17.0* 21.2* 33.0* 28.2*  NEUTROABS 12.8* 10.0* 19.3* 27.1* 21.4*  HGB 8.6* 7.7* 10.1* 11.0* 9.9*  HCT 26.0* 24.2* 30.8* 33.3* 29.9*  MCV 76.7* 78.6* 79.2* 78.9* 80.6  PLT 578* 474* 480* 518* 429*   Cardiac Enzymes: No results for input(s): CKTOTAL, CKMB, CKMBINDEX, TROPONINI in the last 168 hours. BNP (last 3 results) Recent Labs    01/16/19 1820  BNP 1,036.2*    ProBNP (last 3 results) No results for input(s): PROBNP in the last 8760 hours.  CBG: Recent Labs  Lab 03/23/19 0806 03/23/19 1210 03/23/19 1657 03/23/19 2313 03/24/19 0833  GLUCAP 228* 164* 111* 134* 156*    Recent Results (from the past 240 hour(s))  SARS CORONAVIRUS 2 (TAT 6-24 HRS) Nasopharyngeal Nasopharyngeal Swab     Status: None   Collection Time: 03/15/19  4:28 PM   Specimen: Nasopharyngeal Swab  Result Value Ref Range Status   SARS Coronavirus 2 NEGATIVE NEGATIVE Final    Comment: (NOTE) SARS-CoV-2 target nucleic acids are NOT DETECTED. The SARS-CoV-2 RNA is generally detectable in upper and lower respiratory specimens during the acute phase of infection. Negative results do not preclude SARS-CoV-2 infection, do not rule out co-infections with other pathogens, and  should not be used as the sole basis for treatment or other patient management decisions. Negative results must be combined with clinical observations, patient history, and epidemiological information. The expected result is Negative. Fact Sheet for Patients: SugarRoll.be Fact Sheet for Healthcare Providers: https://www.woods-mathews.com/ This test is not yet approved or cleared by the [place] FDA and  has been authorized for detection and/or diagnosis of SARS-CoV-2 by FDA under an Emergency Use Authorization (EUA). This EUA will remain  in effect (meaning this test can be used) for the duration of the  COVID-19 declaration under Section 56 4(b)(1) of the Act, 21 U.S.C. section 360bbb-3(b)(1), unless the authorization is terminated or revoked sooner. Performed at [facility], [place] [address] [place], [place] [address]   Surgical pcr screen     Status: Abnormal   Collection Time: 03/20/19  1:16 AM   Specimen: Nasal Mucosa; Nasal Swab  Result Value Ref Range Status   MRSA, PCR NEGATIVE NEGATIVE Final   Staphylococcus aureus POSITIVE (A) NEGATIVE Final    Comment: (NOTE) The Xpert SA Assay (FDA approved for NASAL specimens in patients 55 years of age and older), is one component of a comprehensive surveillance program. It is not intended to diagnose infection nor to guide or monitor treatment. Performed at [facility], [place] [address] [place], [place] [address]      Studies: Dg Abd Portable 1v  Result Date: 03/23/2019 CLINICAL DATA:  NG tube placement; exploratory laparotomy performed 03/21/2019 EXAM: PORTABLE ABDOMEN - 1 VIEW COMPARISON:  CT abdomen pelvis-03/15/2019 FINDINGS: Enteric tube tip and side port project over the left expected location of mid body of the stomach. There is moderate gaseous distension of several loops of small bowel with index loop of small bowel within the right upper abdominal quadrant  measuring approximately 3.9 cm in diameter. This finding is associated with a paucity of distal colonic gas within the imaged upper abdomen. Lucency overlying the midline of the upper abdomen likely represents pneumoperitoneum as a sequela of recent postoperative state. Limited visualization of the lower thorax demonstrates bibasilar heterogeneous opacities IMPRESSION: 1. Enteric tube tip and side port project over the expected location of mid body of the stomach. 2. Gaseous distention of the small bowel with paucity of colonic gas within the imaged upper abdomen, potentially representative of ileus though could be seen in the setting of a developing small-bowel obstruction. Clinical correlation is advised. 3. Suspected pneumoperitoneum within the midline of the upper abdomen, presumably the sequela of patient's recent postoperative state. Electronically Signed   By: Sandi Mariscal M.D.   On: 03/23/2019 09:10    Scheduled Meds: . amLODipine  5 mg Oral Daily  . atorvastatin  20 mg Oral Daily  . carvedilol  25 mg Oral BID WC  . Chlorhexidine Gluconate Cloth  6 each Topical Daily  . folic acid  1 mg Oral Daily  . gabapentin  300 mg Oral TID  . insulin aspart  0-9 Units Subcutaneous TID WC  . mupirocin ointment  1 application Topical BID  . pantoprazole (PROTONIX) IV  40 mg Intravenous Q24H  . sodium chloride flush  10-40 mL Intracatheter Q12H   Continuous Infusions: . sodium chloride 10 mL/hr at 03/21/19 1430  . piperacillin-tazobactam (ZOSYN)  IV 2.25 g (03/23/19 2331)    Principal Problem:   Colon cancer ([place]) Active Problems:   Hypertension   Type 2 diabetes mellitus with renal complication (HCC)   CKD (chronic kidney disease) stage 5, GFR less than 15 ml/min (HCC)   Anemia   Malignant neoplasm of cecum (HCC)   Goals of care, counseling/discussion  Time spent: 27 minutes  [facility] Pager [phone]. If 7PM-7AM, please contact night-coverage at [email or web address],  password [facility] 03/24/2019, 9:14 AM  LOS: 9 days

## 2019-03-24 NOTE — Care Management Important Message (Signed)
Important Message  Patient Details  Name: [name] MRN: [id number] Date of Birth: 1950/07/01   Medicare Important Message Given:  Yes     Memory Argue 03/24/2019, 4:20 PM

## 2019-03-24 NOTE — Progress Notes (Signed)
Physical Therapy Treatment Patient Details Name: [name] MRN: [id number] DOB: 1950-11-16 Today's Date: 03/24/2019    History of Present Illness Pt is a 68 y/o male admitted secondary to n/v and RLQ abdominal pain. Imaging indicates recurrent colon cancer in the cecum/TI. Colonoscopy on 10/13 showed malignant tumor in the ascending colon and in the cecum. path showed poorly differentiated carcinoma. Pt underwent exploratory laparotomy surgery on 03/21/19; however, intraoperative findings were that tumor was unresectable. Consults to oncology and palliative were recommended. PMH including but not limited to colon cancer s/p some type of colon resection in 1982, CKD stage 5, DM, HTN, CHF and cocaine abuse.    PT Comments    Patient received in bed, RN present in room. He is lethargic, but wants to sit in chair to look out window. Patient is mod independent with bed mobility, requires min guard for transfers and gait. Ambulated 10 feet with RW. Declined going further at this time. Also declined LE strengthening exercises. Patient will continue to benefit from skilled PT while here to improve strength, activity tolerance and ambulation.     Follow Up Recommendations  Home health PT     Equipment Recommendations       Recommendations for Other Services       Precautions / Restrictions Precautions Precautions: Fall Precaution Comments: mod fall, abdominal incision Restrictions Weight Bearing Restrictions: No    Mobility  Bed Mobility Overal bed mobility: Modified Independent             General bed mobility comments: increased time and effort, use of bed rails, no physical assistance needed for supine to sit.  Transfers Overall transfer level: Needs assistance Equipment used: Rolling walker (2 wheeled) Transfers: Sit to/from Stand Sit to Stand: Min guard         General transfer comment: No dizziness reported with standing, min guard for  safety  Ambulation/Gait Ambulation/Gait assistance: Min guard Gait Distance (Feet): 10 Feet Assistive device: Rolling walker (2 wheeled)   Gait velocity: decreased   General Gait Details: ambulates with slow, steady pace, no LOB. I feel like he could have ambulated further but he declined.   Stairs             Wheelchair Mobility    Modified Rankin (Stroke Patients Only)       Balance Overall balance assessment: Mild deficits observed, not formally tested;Needs assistance Sitting-balance support: Feet supported Sitting balance-Leahy Scale: Good     Standing balance support: Bilateral upper extremity supported;During functional activity Standing balance-Leahy Scale: Good Standing balance comment: no LOB                            Cognition Arousal/Alertness: Awake/alert Behavior During Therapy: WFL for tasks assessed/performed Overall Cognitive Status: Within Functional Limits for tasks assessed                               Problem Solving: Slow processing;Requires verbal cues        Exercises      General Comments        Pertinent Vitals/Pain Pain Assessment: Faces Faces Pain Scale: Hurts a little bit Pain Location: abdomen Pain Descriptors / Indicators: Sore Pain Intervention(s): Monitored during session    Home Living                      Prior Function  PT Goals (current goals can now be found in the care plan section) Acute Rehab PT Goals Patient Stated Goal: to find out what else can be done to treat his cancer PT Goal Formulation: With patient Time For Goal Achievement: 04/05/19 Potential to Achieve Goals: Fair Progress towards PT goals: Progressing toward goals    Frequency    Min 3X/week      PT Plan Discharge plan needs to be updated    Co-evaluation              AM-PAC PT "6 Clicks" Mobility   Outcome Measure  Help needed turning from your back to your side while in a  flat bed without using bedrails?: None Help needed moving from lying on your back to sitting on the side of a flat bed without using bedrails?: None Help needed moving to and from a bed to a chair (including a wheelchair)?: A Little Help needed standing up from a chair using your arms (e.g., wheelchair or bedside chair)?: A Little Help needed to walk in hospital room?: A Little Help needed climbing 3-5 steps with a railing? : A Little 6 Click Score: 20    End of Session Equipment Utilized During Treatment: Gait belt Activity Tolerance: Patient tolerated treatment well Patient left: in chair;with chair alarm set;with call bell/phone within reach Nurse Communication: Mobility status PT Visit Diagnosis: Muscle weakness (generalized) (M62.81);Pain;Other abnormalities of gait and mobility (R26.89) Pain - part of body: (abdomen)     Time: 7741-2878 PT Time Calculation (min) (ACUTE ONLY): 13 min  Charges:  $Gait Training: 8-22 mins                     Briceyda Abdullah, PT, GCS 03/24/19,11:34 AM

## 2019-03-24 NOTE — Progress Notes (Signed)
[facility]/Trauma Progress Note  3 Days Post-Op   Assessment/Plan Hx of colon cancer s/p some type of colon resection in 1982 Hx of sickle cell anemia ESRD not on HD- S/P L upper arm HD graft, Dr. Donnetta Hutching, 10/15 DM HTN HLD CHF Cocaine abuse Anemiaper medicine DNR  Cecal Mass - poorly differentiated carcinoma -colonoscopy10/13showedMalignant tumor in the ascending colon and in the cecum.path showedPoorly differentiated carcinoma - CEA 82.7 - s/pexploratory laparotomy,Dr. Dema Severin, 10/16 -Intraoperative findings were that tumor was unresectable -Recommend oncology and palliative care consult Post op ileus - dark emesis yesterday, pt pulled out NGT and refused another - having flatus will start on clears  FEN:CLD VTE: SCD's,chemical prophylaxis per medicine XI:PJASN 10/10>>WBC28.2, afebrile Foley:None Follow up:TBD     LOS: 9 days    Subjective: CC: abdominal pain  No more nausea or vomiting since pt pulled his NGT. He has had a small amount of flatus. No issues overnight.   Objective: Vital signs in last 24 hours: Temp:  [98 F (36.7 C)-98.3 F (36.8 C)] 98.3 F (36.8 C) (10/19 0441) Pulse Rate:  [72-78] 75 (10/19 0442) Resp:  [14-18] 14 (10/19 0441) BP: (153-161)/(82-92) 158/87 (10/19 0442) SpO2:  [97 %-99 %] 97 % (10/19 0442) Last BM Date: 03/21/19  Intake/Output from previous day: 10/18 0701 - 10/19 0700 In: 0  Out: 325 [Urine:325] Intake/Output this shift: No intake/output data recorded.  PE: Gen: Alert, NAD, pleasant, cooperative Pulm:Rate andeffort normal Abd: Soft,mild distention, +BS, midline with staples intact appear well and are without signs of infection, moderate generalized TTP with guarding. No peritonitis  Skin: no rashes noted, warm and dry   Anti-infectives: Anti-infectives (From admission, onward)   Start     Dose/Rate Route Frequency Ordered Stop   03/21/19 1545  cefoTEtan (CEFOTAN) 2 g in  sodium chloride 0.9 % 100 mL IVPB     2 g 200 mL/hr over 30 Minutes Intravenous To Surgery 03/21/19 1542 03/21/19 1603   03/21/19 1515  piperacillin-tazobactam (ZOSYN) IVPB 2.25 g     2.25 g 100 mL/hr over 30 Minutes Intravenous To Surgery 03/21/19 1504 03/21/19 1532   03/16/19 0000  piperacillin-tazobactam (ZOSYN) IVPB 2.25 g     2.25 g 100 mL/hr over 30 Minutes Intravenous Every 8 hours 03/15/19 1527     03/15/19 1430  piperacillin-tazobactam (ZOSYN) IVPB 3.375 g     3.375 g 12.5 mL/hr over 240 Minutes Intravenous  Once 03/15/19 1423 03/15/19 1915      Lab Results:  Recent Labs    03/23/19 0326 03/24/19 0351  WBC 33.0* 28.2*  HGB 11.0* 9.9*  HCT 33.3* 29.9*  PLT 518* 429*   BMET Recent Labs    03/23/19 0326  NA 138  K 4.8  CL 107  CO2 20*  GLUCOSE 203*  BUN 47*  CREATININE 3.56*  CALCIUM 9.4   PT/INR No results for input(s): LABPROT, INR in the last 72 hours. CMP     Component Value Date/Time   NA 138 03/23/2019 0326   K 4.8 03/23/2019 0326   CL 107 03/23/2019 0326   CO2 20 (L) 03/23/2019 0326   GLUCOSE 203 (H) 03/23/2019 0326   BUN 47 (H) 03/23/2019 0326   CREATININE 3.56 (H) 03/23/2019 0326   CALCIUM 9.4 03/23/2019 0326   PROT 7.9 03/15/2019 1204   ALBUMIN 2.2 (L) 03/23/2019 0326   AST 16 03/15/2019 1204   ALT 10 03/15/2019 1204   ALKPHOS 85 03/15/2019 1204   BILITOT 0.5 03/15/2019 1204  GFRNONAA 16 (L) 03/23/2019 0326   GFRAA 19 (L) 03/23/2019 0326   Lipase     Component Value Date/Time   LIPASE 51 03/15/2019 1204    Studies/Results: Dg Abd Portable 1v  Result Date: 03/23/2019 CLINICAL DATA:  NG tube placement; exploratory laparotomy performed 03/21/2019 EXAM: PORTABLE ABDOMEN - 1 VIEW COMPARISON:  CT abdomen pelvis-03/15/2019 FINDINGS: Enteric tube tip and side port project over the left expected location of mid body of the stomach. There is moderate gaseous distension of several loops of small bowel with index loop of small bowel within  the right upper abdominal quadrant measuring approximately 3.9 cm in diameter. This finding is associated with a paucity of distal colonic gas within the imaged upper abdomen. Lucency overlying the midline of the upper abdomen likely represents pneumoperitoneum as a sequela of recent postoperative state. Limited visualization of the lower thorax demonstrates bibasilar heterogeneous opacities IMPRESSION: 1. Enteric tube tip and side port project over the expected location of mid body of the stomach. 2. Gaseous distention of the small bowel with paucity of colonic gas within the imaged upper abdomen, potentially representative of ileus though could be seen in the setting of a developing small-bowel obstruction. Clinical correlation is advised. 3. Suspected pneumoperitoneum within the midline of the upper abdomen, presumably the sequela of patient's recent postoperative state. Electronically Signed   By: Sandi Mariscal M.D.   On: 03/23/2019 09:10     Kalman Drape, [facility] Pager [phone] Cristine Polio, & Friday 7:00am - 4:30pm Thursdays 7:00am -11:30am  Consults: [phone]

## 2019-03-24 NOTE — Progress Notes (Signed)
[name]   DOB:[birth year]   XB#:[id number]    HEME/ONC OVERVIEW: 1. Metastatic poorly differentiated adenocarcinoma of ascending colon -03/2019: ? Marked wall thickening involving the terminal ileum and cecum, regional adenopathy ? Ex lap showed massive R colon tumor replacing the entire cecum and ascending colon up to the hepatic flexure, invades peritoneum and pelvic sidewall, through the small bowel mesentery with studding, c/w carcinomatosis; not operable   PERTINENT NON-HEM/ONC PROBLEMS: 1. Stage IV CKD, not on dialysis (AVG in the LUL created in 03/2019), on ESA by nephrology 2. Recent hx of cocaine abuse (UDS last positive in 01/2019) 3. DM II 4. Sickle cell disease (S-beta thalassemia)  Assessment & Plan:   Metastatic poorly differentiated adenocarcinoma of ascending colon -Patient has been having ongoing discussions w/ his family, including his son and two older daughters.  While he and his son are favoring palliative care, his two daughters are expressing the desire to pursue aggressive treatment, including chemotherapy and radiation.  -I discussed with the patient that in light of his declining functional status, severe malnutrition, recent ex lap, and ongoing ileus with possible early SBO, he would not be a candidate for any systemic therapy until he makes some significant clinical improvement (due to the risks of treatment outweighing benefits) -I encouraged him to continue [place] discussion with his family, and recommend consulting palliative care -When clinically stable, I would also recommend obtaining CT chest w/o contrast to complete staging study, especially if the patient elects to consider systemic therapy   Normocytic anemia  -Likely multifactorial, including occult blood loss from colon malignancy, anemia of chronic disease, Stage IV CKD, and sickle/beta thal disease  -S/p IV iron in 01/2019; on ESA by nephrology -Hgb 9.9 today, slightly lower -No symptoms of  ongoing GI bleeding -If Hgb continues to downtrend, we can consider repeating IV iron while inpatient  Thrombocytosis -Likely reactive in the setting of malignancy and iron deficiency -Plts 429k today, improving -Continue daily CBC   Stage V CKD -Followed by nephrology -AVG placed in LUL on 03/20/2019 -No indication for dialysis yet, pending GOC discussion   Goals of care discussion -I had lengthy discussions with the patient (and his son over the phone) on Friday regarding the poor prognosis of the disease; furthermore, given his declining performance status, severe protein malnutrition, ongoing ileus with possible SBO, and multiple comorbidities, the potential risks/side effects of systemic therapy likely outweigh any benefits at this time -As he had reasonable functional status prior to the recent hospitalization, IF he makes significant clinical improvement, we can reconsider systemic therapy in the future  -Overall prognosis is poor   [name], MD 03/24/2019  9:59 AM   Subjective:  [name] reports that he has moderate to severe right-sided abdominal pain this morning, for which he is thought to have ileus and possible early SBO by general surgery.  General surgery is scheduled to evaluate the patient later this morning.  He spoke with his family at length over the weekend regarding his options, including systemic therapy versus palliative care/hospice.  While he had his son favor palliative care/hospice, patient daughters are pushing for more aggressive treatment.  They have not yet reached a consensus.  He denies any other complaints today.  ROS: Constitutional: ( - ) fevers, ( - )  chills , ( - ) night sweats Ears, nose, mouth, throat, and face: ( - ) mucositis, ( - ) sore throat Respiratory: ( - ) cough, ( - ) dyspnea, ( - )  wheezes Cardiovascular: ( - ) palpitation, ( - ) chest discomfort, ( - ) lower extremity swelling Gastrointestinal:  ( - ) nausea, ( - ) heartburn, ( + )  change in bowel habits Skin: ( - ) abnormal skin rashes Behavioral/Psych: ( - ) mood change, ( - ) new changes  All other systems were reviewed with the patient and are negative.  Objective:  Vitals:   03/24/19 0441 03/24/19 0442  BP: (!) 158/82 (!) 158/87  Pulse: 74 75  Resp: 14   Temp: 98.3 F (36.8 C)   SpO2: 97% 97%     Intake/Output Summary (Last 24 hours) at 03/24/2019 0959 Last data filed at 03/24/2019 0947 Gross per 24 hour  Intake 0 ml  Output 525 ml  Net -525 ml    GENERAL: alert, uncomfortable due to abdominal pain  SKIN: midline abdominal incision covered with dressing  EYES: conjunctiva are pink and non-injected, sclera clear OROPHARYNX: no exudate, no erythema; lips, buccal mucosa, and tongue normal  NECK: supple, non-tender LUNGS: clear to auscultation with normal breathing effort HEART: regular rate & rhythm and no murmurs and no lower extremity edema ABDOMEN: mildly distended, tenderness with palpation in the right side of the abdomen PSYCH: alert & oriented, slowed speech   Labs:  Lab Results  Component Value Date   WBC 28.2 (H) 03/24/2019   HGB 9.9 (L) 03/24/2019   HCT 29.9 (L) 03/24/2019   MCV 80.6 03/24/2019   PLT 429 (H) 03/24/2019   NEUTROABS 21.4 (H) 03/24/2019    Lab Results  Component Value Date   NA 138 03/23/2019   K 4.8 03/23/2019   CL 107 03/23/2019   CO2 20 (L) 03/23/2019    Studies:  Dg Abd Portable 1v  Result Date: 03/23/2019 CLINICAL DATA:  NG tube placement; exploratory laparotomy performed 03/21/2019 EXAM: PORTABLE ABDOMEN - 1 VIEW COMPARISON:  CT abdomen pelvis-03/15/2019 FINDINGS: Enteric tube tip and side port project over the left expected location of mid body of the stomach. There is moderate gaseous distension of several loops of small bowel with index loop of small bowel within the right upper abdominal quadrant measuring approximately 3.9 cm in diameter. This finding is associated with a paucity of distal colonic gas  within the imaged upper abdomen. Lucency overlying the midline of the upper abdomen likely represents pneumoperitoneum as a sequela of recent postoperative state. Limited visualization of the lower thorax demonstrates bibasilar heterogeneous opacities IMPRESSION: 1. Enteric tube tip and side port project over the expected location of mid body of the stomach. 2. Gaseous distention of the small bowel with paucity of colonic gas within the imaged upper abdomen, potentially representative of ileus though could be seen in the setting of a developing small-bowel obstruction. Clinical correlation is advised. 3. Suspected pneumoperitoneum within the midline of the upper abdomen, presumably the sequela of patient's recent postoperative state. Electronically Signed   By: Sandi Mariscal M.D.   On: 03/23/2019 09:10

## 2019-03-25 DIAGNOSIS — C180 Malignant neoplasm of cecum: Secondary | ICD-10-CM | POA: Diagnosis not present

## 2019-03-25 DIAGNOSIS — Z7189 Other specified counseling: Secondary | ICD-10-CM | POA: Diagnosis not present

## 2019-03-25 DIAGNOSIS — C182 Malignant neoplasm of ascending colon: Secondary | ICD-10-CM | POA: Diagnosis not present

## 2019-03-25 DIAGNOSIS — N186 End stage renal disease: Secondary | ICD-10-CM | POA: Diagnosis not present

## 2019-03-25 LAB — CBC WITH DIFFERENTIAL/PLATELET
Abs Immature Granulocytes: 0.15 10*3/uL — ABNORMAL HIGH (ref 0.00–0.07)
Basophils Absolute: 0.1 10*3/uL (ref 0.0–0.1)
Basophils Relative: 1 %
Eosinophils Absolute: 0.8 10*3/uL — ABNORMAL HIGH (ref 0.0–0.5)
Eosinophils Relative: 3 %
HCT: 28.1 % — ABNORMAL LOW (ref 39.0–52.0)
Hemoglobin: 9.1 g/dL — ABNORMAL LOW (ref 13.0–17.0)
Immature Granulocytes: 1 %
Lymphocytes Relative: 10 %
Lymphs Abs: 2.6 10*3/uL (ref 0.7–4.0)
MCH: 26.1 pg (ref 26.0–34.0)
MCHC: 32.4 g/dL (ref 30.0–36.0)
MCV: 80.5 fL (ref 80.0–100.0)
Monocytes Absolute: 2.8 10*3/uL — ABNORMAL HIGH (ref 0.1–1.0)
Monocytes Relative: 11 %
Neutro Abs: 19.5 10*3/uL — ABNORMAL HIGH (ref 1.7–7.7)
Neutrophils Relative %: 74 %
Platelets: 442 10*3/uL — ABNORMAL HIGH (ref 150–400)
RBC: 3.49 MIL/uL — ABNORMAL LOW (ref 4.22–5.81)
RDW: 25.2 % — ABNORMAL HIGH (ref 11.5–15.5)
WBC: 25.9 10*3/uL — ABNORMAL HIGH (ref 4.0–10.5)
nRBC: 0.1 % (ref 0.0–0.2)

## 2019-03-25 LAB — RENAL FUNCTION PANEL
Albumin: 2 g/dL — ABNORMAL LOW (ref 3.5–5.0)
Anion gap: 11 (ref 5–15)
BUN: 65 mg/dL — ABNORMAL HIGH (ref 8–23)
CO2: 19 mmol/L — ABNORMAL LOW (ref 22–32)
Calcium: 9.3 mg/dL (ref 8.9–10.3)
Chloride: 109 mmol/L (ref 98–111)
Creatinine, Ser: 4.64 mg/dL — ABNORMAL HIGH (ref 0.61–1.24)
GFR calc Af Amer: 14 mL/min — ABNORMAL LOW (ref >60)
GFR calc non Af Amer: 12 mL/min — ABNORMAL LOW (ref >60)
Glucose, Bld: 133 mg/dL — ABNORMAL HIGH (ref 70–99)
Phosphorus: 4.2 mg/dL (ref 2.5–4.6)
Potassium: 4.5 mmol/L (ref 3.5–5.1)
Sodium: 139 mmol/L (ref 135–145)

## 2019-03-25 LAB — GLUCOSE, CAPILLARY
Glucose-Capillary: 131 mg/dL — ABNORMAL HIGH (ref 70–99)
Glucose-Capillary: 170 mg/dL — ABNORMAL HIGH (ref 70–99)
Glucose-Capillary: 151 mg/dL — ABNORMAL HIGH (ref 70–99)
Glucose-Capillary: 200 mg/dL — ABNORMAL HIGH (ref 70–99)

## 2019-03-25 MED ORDER — GABAPENTIN 300 MG PO CAPS
300.0000 mg | ORAL_CAPSULE | Freq: Every day | ORAL | Status: DC
  Administered 2019-03-26 – 2019-04-03 (×9): 300 mg via ORAL
  Filled 2019-03-25 (×9): qty 1

## 2019-03-25 MED ORDER — ADULT MULTIVITAMIN W/MINERALS CH
1.0000 | ORAL_TABLET | Freq: Every day | ORAL | Status: DC
  Administered 2019-03-25 – 2019-04-03 (×10): 1 via ORAL
  Filled 2019-03-25 (×9): qty 1

## 2019-03-25 MED ORDER — STERILE WATER FOR INJECTION IV SOLN: INTRAVENOUS | Status: DC

## 2019-03-25 MED ORDER — ENSURE ENLIVE PO LIQD
237.0000 mL | Freq: Three times a day (TID) | ORAL | Status: DC
  Administered 2019-03-25 – 2019-03-27 (×4): 237 mL via ORAL

## 2019-03-25 MED ORDER — STERILE WATER FOR INJECTION IV SOLN
INTRAVENOUS | Status: DC
  Administered 2019-03-25 – 2019-03-28 (×6): via INTRAVENOUS
  Filled 2019-03-25 (×10): qty 1000

## 2019-03-25 NOTE — Progress Notes (Signed)
[facility] PROGRESS NOTE  Naman Spychalski Madia QBH:[id number] DOB: [birth year] DOA: 03/15/2019 PCP: Nolene Ebbs, MD  Brief summary   [name] is a 68 y.o. male with medical history significant of sickle cell anemia (SS disease); ESRD not on HD; HTN; HLD; DM; and colon CA presenting with n/v.  He had a vocal cord lesion removed with CO2 laser excision during microlaryngoscopy on 10/7.  He went home and then started vomiting the evening of 10.7.  No vomiting prior to surgery.  It would come and go.  He has been losing a lot of weight and so has been drinking Boost.  Unintentional weight loss of about 50 pounds in the last 4-5 months.  He started having night sweats recently.  No LAD.  Slight diarrhea after the surgery, thought it was related to the anesthesia.  Normal BMs other than post-operatively.  Stools have been black because he has been taking iron.  He got 4 units of blood a week or two before the surgery, that increased his Hgb from 2.5 up to 8.9.  He has not yet started HD, still making urine.  His ex-wife has been caring for him for the last 3 weeks.  His colon cancer was 20 years ago.  He has never had a colonoscopy in over 25 years.  He was last admitted 8/13-15 for symptomatic anemia from CKD and given IV iron and Epogen.  Upon arrival to ED, patient had c/o abdominal pain and vomiting.  Had laryngeal biopsy a few days ago.  WBC 40k.  CT read as progression of colon CA at cecum and TI with ?typhlitis.  Patient was admitted under hospitalist service with GI and surgery consultation.  Patient underwent colonoscopy on 03/18/2019 which showed cecum and ascending colon mass which was biopsied.  Patient's hemoglobin dropped from 7.026.5 on 03/15/2019 and he received 1 unit of PRBC transfusion but then again it dropped to 6.5 so he received second unit of transfusion on 03/17/2019.  General surgery consulted cardiology and patient was cleared for surgery from cardiac standpoint.   Palliative medicine was also consulted per general surgery recommendations.  Patient decided to proceed with surgery.  Nephrology consulted vascular surgery for AV graft which was placed on 03/20/2019.  Patient then underwent exploratory laparotomy by general surgery on 03/21/2019 however his mass was unresectable.  Patient then expressed wishes to see oncology for consideration of radiation and chemotherapy.  Dr. [name] from oncology saw patient and he recommends against chemotherapy at this point in time due to several comorbidities that patient has.  Palliative care was consulted.  Assessment/Plan:  Colon cancer. Patient with remote h/o colon cancer (records not apparently available) without subsequent follow-up, ex-wife reports no colonoscopy in 25 years -Presented with RLQ abdominal pain, and n/v; also with 50 pounds unintentional weight loss in the last 4-5 months and recent night sweats -Imaging indicates recurrent colon cancer in the cecum/TI. This appears to be at least stage 3 with positive LN, No obstruction on CT. CEA level-elevated.  Patient underwent colonoscopy on 03/18/2019 which showed cecum and ascending colon mass which was biopsied.  Patient has now decided to proceed with surgery.  Cardiology has cleared the patient for surgery from cardiac standpoint stating he will be high risk candidate due to other medical problems.  Patient was going to have palliative right hemicolectomy and underwent exploratory laparotomy by general surgery on 03/21/2019 however his mass was unresectable so he was closed back.  Patient now wants all sorts of  chemotherapy and radiation and wants me to consult oncology.  Seen by Dr. [name] who recommends against systemic therapy at this point in time due to his comorbidities and poor nutritional status.  Appreciate oncology help in his efforts to figure out final plan while also talking to his family members.  Patient remains hopeful that he will be strong enough 1 day  to be able to start chemotherapy and all sorts of treatment.  As of now, he is wanting aggressive care but wants to remain DNR.  Stage 5 CKD -Patient has been preparing for HD but has not yet started. Nephrology prn order set utilized.  Seen by nephrology and they consulted vascular surgery for temporary HD and fistula in anticipation for hemodialysis.  Patient underwent left upper arm AV fistula placed on 03/20/2019.  Continues to have fair urine output with a stable creatinine.  Defer further management to nephrology.  Acute blood loss anemia on chronic anemia secondary to colon cancer/lower GI bleed vs sickle cell anemia: -Likely multifactorial including h/o sickle cell disease and stage 5 CKD but most likely due to his recurrence of colon cancer.  Received 1 unit of PRBC on 03/16/2019 and 1 unit again on 03/17/2019.  Hemoglobin has remained stable in mid 8s.  Watch daily.  HTN -Blood pressure controlled.  Continue Norvasc, Coreg  Chronic diastolic CHF -29/5188 echo with preserved EF and grade 1 diastolic dysfunction -Despite stage 5 CKD, he does not appear to be volume overloaded at this time. -If he becomes volume overloaded, the likely treatment would be HD.  DM -Hemoglobin A1c 6.9 on 03/15/2019.  Hold Onglyza.  Blood sugar much better.  Continue SSI.    HLD  -Continue Lipitor  Cocaine abuse -Patient denies use since July, but was positive at the time of last admission in August. -UDS negative this time.  Postoperative ileus: Doing well.  Abdominal pain improving.  No nausea.  Passing flatus but no bowel movement.  Code Status: DNR Family Communication: Plan of care discussed with patient.  He had no further questions.  He is going to talk to his family about further plan of care. Disposition Plan: remains inpatient    Consultants:  Surgery  GI  Cardiology  Vascular surgery  Oncology consulted 03/22/2019  Palliative care  Procedures:  Colonoscopy on  03/16/2089  Left upper arm AV graft placed on 03/20/2019  Antibiotics: Anti-infectives (From admission, onward)   Start     Dose/Rate Route Frequency Ordered Stop   03/21/19 1545  cefoTEtan (CEFOTAN) 2 g in sodium chloride 0.9 % 100 mL IVPB     2 g 200 mL/hr over 30 Minutes Intravenous To Surgery 03/21/19 1542 03/21/19 1603   03/21/19 1515  piperacillin-tazobactam (ZOSYN) IVPB 2.25 g     2.25 g 100 mL/hr over 30 Minutes Intravenous To Surgery 03/21/19 1504 03/21/19 1532   03/16/19 0000  piperacillin-tazobactam (ZOSYN) IVPB 2.25 g     2.25 g 100 mL/hr over 30 Minutes Intravenous Every 8 hours 03/15/19 1527     03/15/19 1430  piperacillin-tazobactam (ZOSYN) IVPB 3.375 g     3.375 g 12.5 mL/hr over 240 Minutes Intravenous  Once 03/15/19 1423 03/15/19 1915       (indicate start date, and stop date if known)  HPI/Subjective: Patient seen and examined.  Abdominal pain improved a little bit.  No other complaints such as nausea.  Passing flatus but no bowel movement.  Remains hopeful that he will be strong 1 day and start the chemotherapy.  Objective: Vitals:   03/24/19 1942 03/25/19 0401  BP: 115/69 (!) 144/77  Pulse: 78 81  Resp: 20 20  Temp: 98.5 F (36.9 C) 97.7 F (36.5 C)  SpO2: 97% 98%    Intake/Output Summary (Last 24 hours) at 03/25/2019 0902 Last data filed at 03/25/2019 0500 Gross per 24 hour  Intake -  Output 400 ml  Net -400 ml   Filed Weights   03/18/19 0800 03/21/19 1327  Weight: 64 kg 64 kg    Exam:  General exam: Appears calm and comfortable  Respiratory system: Clear to auscultation. Respiratory effort normal. Cardiovascular system: S1 & S2 heard, RRR. No JVD, murmurs, rubs, gallops or clicks. No pedal edema. Gastrointestinal system: Abdomen is nondistended, firm and generalized tender. No organomegaly or masses felt. Normal bowel sounds heard. Central nervous system: Alert and oriented. No focal neurological deficits. Extremities: Symmetric 5 x 5  power. Skin: No rashes, lesions or ulcers.  Psychiatry: Judgement and insight appear poor. Mood & affect appropriate.    Data Reviewed: Basic Metabolic Panel: Recent Labs  Lab 03/20/19 0433 03/21/19 0402 03/23/19 0326 03/24/19 1229 03/25/19 0407  NA 139 139 138 136 139  K 4.3 4.2 4.8 4.7 4.5  CL 107 109 107 107 109  CO2 20* 20* 20* 19* 19*  GLUCOSE 143* 135* 203* 165* 133*  BUN 39* 37* 47* 58* 65*  CREATININE 3.08* 3.20* 3.56* 4.23* 4.64*  CALCIUM 9.1 8.8* 9.4 9.4 9.3  PHOS  --   --  4.3 4.2 4.2   Liver Function Tests: Recent Labs  Lab 03/23/19 0326 03/24/19 1229 03/25/19 0407  ALBUMIN 2.2* 2.2* 2.0*   No results for input(s): LIPASE, AMYLASE in the last 168 hours. No results for input(s): AMMONIA in the last 168 hours. CBC: Recent Labs  Lab 03/21/19 0402 03/22/19 0323 03/23/19 0326 03/24/19 0351 03/25/19 0407  WBC 17.0* 21.2* 33.0* 28.2* 25.9*  NEUTROABS 10.0* 19.3* 27.1* 21.4* 19.5*  HGB 7.7* 10.1* 11.0* 9.9* 9.1*  HCT 24.2* 30.8* 33.3* 29.9* 28.1*  MCV 78.6* 79.2* 78.9* 80.6 80.5  PLT 474* 480* 518* 429* 442*   Cardiac Enzymes: No results for input(s): CKTOTAL, CKMB, CKMBINDEX, TROPONINI in the last 168 hours. BNP (last 3 results) Recent Labs    01/16/19 1820  BNP 1,036.2*    ProBNP (last 3 results) No results for input(s): PROBNP in the last 8760 hours.  CBG: Recent Labs  Lab 03/23/19 2313 03/24/19 0833 03/24/19 1311 03/24/19 2216 03/25/19 0832  GLUCAP 134* 156* 161* 135* 131*    Recent Results (from the past 240 hour(s))  SARS CORONAVIRUS 2 (TAT 6-24 HRS) Nasopharyngeal Nasopharyngeal Swab     Status: None   Collection Time: 03/15/19  4:28 PM   Specimen: Nasopharyngeal Swab  Result Value Ref Range Status   SARS Coronavirus 2 NEGATIVE NEGATIVE Final    Comment: (NOTE) SARS-CoV-2 target nucleic acids are NOT DETECTED. The SARS-CoV-2 RNA is generally detectable in upper and lower respiratory specimens during the acute phase of  infection. Negative results do not preclude SARS-CoV-2 infection, do not rule out co-infections with other pathogens, and should not be used as the sole basis for treatment or other patient management decisions. Negative results must be combined with clinical observations, patient history, and epidemiological information. The expected result is Negative. Fact Sheet for Patients: SugarRoll.be Fact Sheet for Healthcare Providers: https://www.woods-mathews.com/ This test is not yet approved or cleared by the [place] FDA and  has been authorized for detection and/or diagnosis of SARS-CoV-2  by FDA under an Emergency Use Authorization (EUA). This EUA will remain  in effect (meaning this test can be used) for the duration of the COVID-19 declaration under Section 56 4(b)(1) of the Act, 21 U.S.C. section 360bbb-3(b)(1), unless the authorization is terminated or revoked sooner. Performed at [facility], [place] [address] [place], [place] [address]   Surgical pcr screen     Status: Abnormal   Collection Time: 03/20/19  1:16 AM   Specimen: Nasal Mucosa; Nasal Swab  Result Value Ref Range Status   MRSA, PCR NEGATIVE NEGATIVE Final   Staphylococcus aureus POSITIVE (A) NEGATIVE Final    Comment: (NOTE) The Xpert SA Assay (FDA approved for NASAL specimens in patients 33 years of age and older), is one component of a comprehensive surveillance program. It is not intended to diagnose infection nor to guide or monitor treatment. Performed at [facility], [place] [address], [place], [place] [address]      Studies: No results found.  Scheduled Meds: . amLODipine  5 mg Oral Daily  . atorvastatin  20 mg Oral Daily  . carvedilol  25 mg Oral BID WC  . Chlorhexidine Gluconate Cloth  6 each Topical Daily  . folic acid  1 mg Oral Daily  . gabapentin  300 mg Oral TID  . insulin aspart  0-9 Units Subcutaneous TID WC  . mupirocin ointment  1  application Topical BID  . pantoprazole (PROTONIX) IV  40 mg Intravenous Q24H  . sodium chloride flush  10-40 mL Intracatheter Q12H   Continuous Infusions: . sodium chloride 10 mL/hr at 03/21/19 1430  . piperacillin-tazobactam (ZOSYN)  IV 2.25 g (03/25/19 0754)  .  sodium bicarbonate (isotonic) infusion in sterile water      Principal Problem:   Colon cancer ([place]) Active Problems:   Hypertension   Type 2 diabetes mellitus with renal complication (HCC)   CKD (chronic kidney disease) stage 5, GFR less than 15 ml/min (HCC)   Anemia   Malignant neoplasm of cecum (HCC)   Goals of care, counseling/discussion   Thrombocytosis ([place])  Time spent: 26 minutes  [facility] Pager [phone]. If 7PM-7AM, please contact night-coverage at [email or web address], password [facility] 03/25/2019, 9:02 AM  LOS: 10 days

## 2019-03-25 NOTE — Plan of Care (Signed)
  Problem: Education: Goal: Knowledge of General Education information will improve Description Including pain rating scale, medication(s)/side effects and non-pharmacologic comfort measures Outcome: Progressing   

## 2019-03-25 NOTE — Progress Notes (Signed)
Visited with patient as a follow up. Patient was interested in having ex-wife to visit to discuss Home Health options. Informed CSW. Will continue to provide spiritual care as needed.  Rev. [place]. Pager# [phone]

## 2019-03-25 NOTE — Progress Notes (Signed)
Nutrition Follow-up  RD working remotely.  DOCUMENTATION CODES:   Not applicable  INTERVENTION:   -D/c Boost Breeze po TID, each supplement provides 250 kcal and 9 grams of protein -Ensure Enlive po TID, each supplement provides 350 kcal and 20 grams of protein -Magic cup TID with meals, each supplement provides 290 kcal and 9 grams of protein -MVI with minerals daily  NUTRITION DIAGNOSIS:   Inadequate oral intake related to altered GI function as evidenced by NPO status.  Progressing; advanced to full liquid diet on 03/25/19  GOAL:   Patient will meet greater than or equal to 90% of their needs  Progressing  MONITOR:   PO intake, Supplement acceptance, Diet advancement, Labs, Weight trends, Skin  REASON FOR ASSESSMENT:   Malnutrition Screening Tool    ASSESSMENT:   [name] is a 67 y.o. male with medical history significant of sickle cell anemia (SS disease); ESRD not on HD; HTN; HLD; DM; and colon CA presenting with n/v.  He had a vocal cord lesion removed with CO2 laser excision during microlaryngoscopy on 10/7.  He went home and then started vomiting the evening of 10.7.  No vomiting prior to surgery.  It would come and go.  He has been losing a lot of weight and so has been drinking Boost.  Unintentional weight loss of about 50 pounds in the last 4-5 months.  He started having night sweats recently.  No LAD.  Slight diarrhea after the surgery, thought it was related to the anesthesia.  Normal BMs other than post-operatively.  Stools have been black because he has been taking iron.  He got 4 units of blood a week or two before the surgery, that increased his Hgb from 2.5 up to 8.9.  He has not yet started HD, still making urine.  10/13- s/p colonoscopy 10/14- pathology on colon mass reveals poorly differentiated carcinoma 10/15- s/p PROCEDURE: Left upper arm AV Gore-Tex graft 10/16- s/p ex lap, colon mass unresectable 10/18- NGT inserted due to emesis, removed    Reviewed I/O's: -400 ml x 24 hours and +2 L since admission  Pt advanced to full liquids today. Noted poor meal completion (0%).   Oncology, nephrology, and palliative care following. Pt with poor performance status and prognosis. Pt anf family struggling with goals of care. Pt desires to "take it day by day" and is hopeful that chemotherapy and/or HD may assist with prolonging quality of life. Pt son favoring comfort measures, but daughters would like pt to pursue aggressive care.   Medications reviewed and include folic acid.   Labs reviewed: K and Mg WDL. CBGS: 135-161 (inpatient orders for glycemic control are 0-9 units insulin aspart TID).   Diet Order:   Diet Order            Diet full liquid Room service appropriate? Yes; Fluid consistency: Thin  Diet effective now              EDUCATION NEEDS:   No education needs have been identified at this time  Skin:  Skin Assessment: Skin Integrity Issues: Skin Integrity Issues:: Incisions Incisions: close lt arm, abdomen  Last BM:  03/21/19  Height:   Ht Readings from Last 1 Encounters:  03/21/19 6\' 1"  (1.854 m)    Weight:   Wt Readings from Last 1 Encounters:  03/21/19 64 kg    Ideal Body Weight:  83.6 kg  BMI:  Body mass index is 18.62 kg/m.  Estimated Nutritional Needs:   Kcal:  2050-2250  Protein:  110-125 grams  Fluid:  2-2.2 L    Keyasha Miah A. Jimmye Norman, RD, LDN, [place] Registered Dietitian II Certified Diabetes Care and Education Specialist Pager: [phone] After hours Pager: [phone]

## 2019-03-25 NOTE — Progress Notes (Signed)
PALLIATIVE NOTE:  Patient resting in bed. Awake, A&O x4. Denies pain but states he has intermittent abdominal pain. Reports he had a "really bad episode" this morning about 0730 where pain came out of nowhere and was so severe it literally took his breath. States he is unsure if he moved the wrong way or something in his sleep. Denies shortness of breath at this time.   His ex-wife [name]) is on the phone requesting updates. Mr. Egerton gave me permission to speak with her and provide updates as requested. She states [name] (patient's son) usually calls her but has not has called in a few days and she is getting concerned. Updates provided. She verbalized understanding. She shared "if he has to do chemo maybe he can go at the same time as me. I go over there everyday for radiation and may have to start chemo for my lung cancer". Again wife appreciative of updates and call was ended via patient.   He is holding a list of home health agencies from [employer]. He shares that he is interested in home health but is not interested in the company he had previous experience with. He shares he did not have a good experience with them and if they were the only option he would decline services. He can not remember the name of the company but is planning on asking his wife and son. He states he will call them later and see if they can remember.   I attempt to re-visit our discussion on yesterday regarding patient's wishes. He shares he has decided that he would like to proceed with any and all available treatments that he can including chemo and HD. He again verbalizes his awareness that he is not "strong enough" to have chemo right now but is hopeful maybe this will get better and he can. He shares if he is told that there are no available treatment options he would then be open to hospice and "living my life how I want until the end". He states he hopes when that time comes his daughters will understand and not be  upset and make the best of what they have. Support given.   Chaplain support now at the bedside.  All questions answered.   Assessment -A&O x4, NAD -RRR -clear bilaterally  -mood appropriate  Plans -DNR -Continue with current plan of care per medical team  -Patient remains hopeful that he will become stronger to be able to receive chemo and HD. Open to home health and outpatient palliative support. Goals are clear and set if he is unable to to undergo chemo or HD he would then prefer to be kept comfortable and transition care to a more comfort/hospice approach.  -PMT will continue to support and follow as needed.   Total Time: 35 min.   Greater than 50%  of this time was spent counseling and coordinating care related to the above assessment and plan.  Alda Lea, AGPCNP-BC Palliative Medicine Team

## 2019-03-25 NOTE — Progress Notes (Signed)
HEMATOLOGY-ONCOLOGY PROGRESS NOTE  SUBJECTIVE: [name] was sleeping when I arrived. Lunch tray was at the bedside untouched. He tells me that he still has some abdominal discomfort.  Has not really been eating or drinking much over the past few days.  No flatus or BM.  REVIEW OF SYSTEMS:   Constitutional: reports fatigue, weakness, poor appetite.  Respiratory: Denies cough, dyspnea or wheezes Cardiovascular: Denies palpitation, chest discomfort Gastrointestinal:  Has mild abdominal pain. No nausea or vomiting.  Skin: Denies abnormal skin rashes Lymphatics: Denies new lymphadenopathy or easy bruising Neurological:Denies numbness, tingling or new weaknesses Behavioral/Psych: Mood is stable, no new changes  Extremities: reports swelling to his feet.  All other systems were reviewed with the patient and are negative.  I have reviewed the past medical history, past surgical history, social history and family history with the patient and they are unchanged from previous note.   PHYSICAL EXAMINATION:  Vitals:   03/24/19 1942 03/25/19 0401  BP: 115/69 (!) 144/77  Pulse: 78 81  Resp: 20 20  Temp: 98.5 F (36.9 C) 97.7 F (36.5 C)  SpO2: 97% 98%   Filed Weights   03/18/19 0800 03/21/19 1327  Weight: 141 lb 1.5 oz (64 kg) 141 lb 1.5 oz (64 kg)    Intake/Output from previous day: 10/19 0701 - 10/20 0700 In: -  Out: 400 [Urine:400]  GENERAL:chronically ill appearing, cachectic LUNGS: clear to auscultation and percussion with normal breathing effort HEART: regular rate & rhythm and no murmurs. 1+ pedal edema bilaterally ABDOMEN: positive BS, soft, midline with staples, no redness or drainage Musculoskeletal:no cyanosis of digits and no clubbing  NEURO: alert & oriented x 3 with fluent speech, no focal motor/sensory deficits  LABORATORY DATA:  I have reviewed the data as listed CMP Latest Ref Rng & Units 03/25/2019 03/24/2019 03/23/2019  Glucose 70 - 99 mg/dL 133(H) 165(H)  203(H)  BUN 8 - 23 mg/dL 65(H) 58(H) 47(H)  Creatinine 0.61 - 1.24 mg/dL 4.64(H) 4.23(H) 3.56(H)  Sodium 135 - 145 mmol/L 139 136 138  Potassium 3.5 - 5.1 mmol/L 4.5 4.7 4.8  Chloride 98 - 111 mmol/L 109 107 107  CO2 22 - 32 mmol/L 19(L) 19(L) 20(L)  Calcium 8.9 - 10.3 mg/dL 9.3 9.4 9.4  Total Protein 6.5 - 8.1 g/dL - - -  Total Bilirubin 0.3 - 1.2 mg/dL - - -  Alkaline Phos 38 - 126 U/L - - -  AST 15 - 41 U/L - - -  ALT 0 - 44 U/L - - -    Lab Results  Component Value Date   WBC 25.9 (H) 03/25/2019   HGB 9.1 (L) 03/25/2019   HCT 28.1 (L) 03/25/2019   MCV 80.5 03/25/2019   PLT 442 (H) 03/25/2019   NEUTROABS 19.5 (H) 03/25/2019    Ct Abdomen Pelvis Wo Contrast  Result Date: 03/15/2019 CLINICAL DATA:  Emesis, right lower quadrant pain, history of colon carcinoma EXAM: CT ABDOMEN AND PELVIS WITHOUT CONTRAST TECHNIQUE: Multidetector CT imaging of the abdomen and pelvis was performed following the standard protocol without IV contrast. COMPARISON:  02/09/2018 and previous FINDINGS: Lower chest: Linear scarring or subsegmental atelectasis posteriorly in the lower lobes as before. No pleural or pericardial effusion. Hepatobiliary: No focal liver abnormality is seen. Status post cholecystectomy. No biliary dilatation. Pancreas: Unremarkable. No pancreatic ductal dilatation or surrounding inflammatory changes. Spleen: Small with coarse calcifications as before. Adrenals/Urinary Tract: Adrenal glands are unremarkable. Kidneys are normal, without renal calculi, focal lesion, or hydronephrosis. Bladder is  unremarkable. Stomach/Bowel: Stomach decompressed. Small bowel decompressed. Marked wall thickening involving the terminal ileum and cecum without obstruction. Mild adjacent inflammatory/edematous changes. Regional adenopathy measured up to 3.2 cm , some nodes containing calcifications. The remainder of the colon is unremarkable. Vascular/Lymphatic: Right lower quadrant and central mesenteric  adenopathy as above. No retroperitoneal or pelvic adenopathy. No significant vascular pathology identified. Reproductive: Uterus and bilateral adnexa are unremarkable. Other: Bilateral pelvic phleboliths.  No ascites.  No free air. Musculoskeletal: Extensive mottled lytic/sclerotic appearance of all visualized bones, probably related to sickle cell disease, present on studies dating back to 11/04/2016. No fracture. IMPRESSION: 1. Marked progression of wall thickening in the cecum, now also involving terminal ileum, with progressive regional adenopathy, suggesting progression of colon carcinoma. Cannot exclude superimposed typhlitis given the clinical presentation and regional inflammatory/edematous change. 2. Chronic changes of sickle cell disease in visualized bones and spleen. Electronically Signed   By: Lucrezia Europe M.D.   On: 03/15/2019 13:59   Ir Fluoro Guide Cv Line Right  Result Date: 03/18/2019 INDICATION: 68 year old male referred for central line placement EXAM: IMAGE GUIDED PLACEMENT OF CENTRAL VENOUS CATHETER MEDICATIONS: NONE ANESTHESIA/SEDATION: NONE FLUOROSCOPY TIME:  Fluoroscopy Time: 0 minutes 12 seconds (1 mGy). COMPLICATIONS: NONE PROCEDURE: Informed written consent was obtained from the patient after a thorough discussion of the procedural risks, benefits and alternatives. All questions were addressed. Maximal Sterile Barrier Technique was utilized including caps, mask, sterile gowns, sterile gloves, sterile drape, hand hygiene and skin antiseptic. A timeout was performed prior to the initiation of the procedure. Patient was positioned supine position on fluoroscopy table. The right neck was prepped and draped in the usual sterile fashion. 1% lidocaine was used for local anesthesia. Using ultrasound guidance, micropuncture access was performed at the right IJ. Once we confirmed wire position, we measured and internal length to 2 vertebral bodies below the carina. The catheter was modified on  the back table and then placed through the peel-away sheath. Final image was stored. Aspiration was confirmed at the catheter site. Catheter was sutured in position and a sterile bandage was placed. Patient tolerated the procedure well and remained hemodynamically stable throughout. No complications were encountered and no significant blood loss. IMPRESSION: Status post image guided placement of right IJ central venous catheter. Signed, Dulcy Fanny. Dellia Nims, RPVI [facility] Radiology Electronically Signed   By: Corrie Mckusick D.O.   On: 03/18/2019 08:28   Ir US Guide Vasc Access Right  Result Date: 03/18/2019 INDICATION: 68 year old male referred for central line placement EXAM: IMAGE GUIDED PLACEMENT OF CENTRAL VENOUS CATHETER MEDICATIONS: NONE ANESTHESIA/SEDATION: NONE FLUOROSCOPY TIME:  Fluoroscopy Time: 0 minutes 12 seconds (1 mGy). COMPLICATIONS: NONE PROCEDURE: Informed written consent was obtained from the patient after a thorough discussion of the procedural risks, benefits and alternatives. All questions were addressed. Maximal Sterile Barrier Technique was utilized including caps, mask, sterile gowns, sterile gloves, sterile drape, hand hygiene and skin antiseptic. A timeout was performed prior to the initiation of the procedure. Patient was positioned supine position on fluoroscopy table. The right neck was prepped and draped in the usual sterile fashion. 1% lidocaine was used for local anesthesia. Using ultrasound guidance, micropuncture access was performed at the right IJ. Once we confirmed wire position, we measured and internal length to 2 vertebral bodies below the carina. The catheter was modified on the back table and then placed through the peel-away sheath. Final image was stored. Aspiration was confirmed at the catheter site. Catheter was  sutured in position and a sterile bandage was placed. Patient tolerated the procedure well and remained  hemodynamically stable throughout. No complications were encountered and no significant blood loss. IMPRESSION: Status post image guided placement of right IJ central venous catheter. Signed, Dulcy Fanny. Dellia Nims, RPVI [facility] Radiology Electronically Signed   By: Corrie Mckusick D.O.   On: 03/18/2019 08:28   Dg Abd Portable 1v  Result Date: 03/23/2019 CLINICAL DATA:  NG tube placement; exploratory laparotomy performed 03/21/2019 EXAM: PORTABLE ABDOMEN - 1 VIEW COMPARISON:  CT abdomen pelvis-03/15/2019 FINDINGS: Enteric tube tip and side port project over the left expected location of mid body of the stomach. There is moderate gaseous distension of several loops of small bowel with index loop of small bowel within the right upper abdominal quadrant measuring approximately 3.9 cm in diameter. This finding is associated with a paucity of distal colonic gas within the imaged upper abdomen. Lucency overlying the midline of the upper abdomen likely represents pneumoperitoneum as a sequela of recent postoperative state. Limited visualization of the lower thorax demonstrates bibasilar heterogeneous opacities IMPRESSION: 1. Enteric tube tip and side port project over the expected location of mid body of the stomach. 2. Gaseous distention of the small bowel with paucity of colonic gas within the imaged upper abdomen, potentially representative of ileus though could be seen in the setting of a developing small-bowel obstruction. Clinical correlation is advised. 3. Suspected pneumoperitoneum within the midline of the upper abdomen, presumably the sequela of patient's recent postoperative state. Electronically Signed   By: Sandi Mariscal M.D.   On: 03/23/2019 09:10    ASSESSMENT AND PLAN: Metastatic poorly differentiated adenocarcinoma of ascending colon -Patient has been having ongoing discussions w/ his family, including his son and two older daughters.  While he and  his son are favoring palliative care, his two daughters are expressing the desire to pursue aggressive treatment, including chemotherapy and radiation.  -I discussed with the patient that in light of his declining functional status, severe malnutrition, recent ex lap, and ongoing ileus with possible early SBO, he would not be a candidate for any systemic therapy until he makes some significant clinical improvement (due to the risks of treatment outweighing benefits) -Palliative care is following the patient.  The patient has expressed that he would like to proceed with any and all available treatments that he can including chemotherapy and hemodialysis.  He is aware that he is not strong enough right now to receive chemotherapy but is hopeful that this will improve. -When clinically stable, recommend obtaining CT chest w/o contrast to complete staging workup, especially if the patient elects to consider systemic therapy   Normocytic anemia  -Likely multifactorial,including occult blood loss from colon malignancy, anemia of chronic disease, Stage IV CKD, and sickle/beta thal disease  -S/p IV iron in 01/2019; on ESA by nephrology -Hgb 9.1 today, slightly lower -No symptoms of ongoing GI bleeding -If Hgb continues to downtrend, we can consider repeating IV iron while inpatient  Thrombocytosis -Likely reactive in the setting of malignancy and iron deficiency -Plts 442k today, improving -Continue daily CBC   Stage V CKD -Followed by nephrology -AVG placed in LUL on 03/20/2019 -No indication for dialysis yet, the patient has expressed that he would like to proceed with any available treatments including hemodialysis.  Goals of care discussion -We have had previous discussions regarding the poor prognosis of the disease; furthermore, given his declining performance status, severe protein malnutrition, ongoing ileus with  possible SBO, and multiple comorbidities, the potential risks/side effects  of systemic therapy likely outweigh any benefits at this time -As he had reasonable functional status prior to the recent hospitalization, IF he makes significant clinical improvement, we can reconsider systemic therapy in the future  -Overall prognosis is poor    LOS: 10 days   Mikey Bussing, DNP, AGPCNP-BC, AOCNP 03/25/19

## 2019-03-25 NOTE — Progress Notes (Signed)
Patient ID: [name], male   DOB: 1951-01-02, 68 y.o.   MRN: [id number]  [name] Progress Note   Assessment/ Plan:   1.  Chronic kidney disease stage IV-V: With multifactorial underlying chronic kidney disease from diabetes, hypertension, history of cocaine use and sickle cell disease.  He is now status post left upper arm arteriovenous graft placement on 03/20/2019 after primary failure of previously placed access.  Some worsening BUN/creatinine noted which likely is hemodynamically mediated with his ileus/restricted oral intake/third spacing-now with some oral intake which I will supplement with gentle intravenous fluids.  No acute indications for dialysis at this time.  Overall, poor prognosis noted and likely to worsen (increased first year mortality) on HD. 2.  Recurrent colon cancer: With new cecal mass and biopsy showing metastatic poorly differentiated adenocarcinoma of the ascending colon with metastases that is inoperable.  Plans noted by Dr. Maylon Todd of oncology to offer palliative chemotherapy after additional imaging for staging and allowing improvement of patient clinical status/recovery from recent ex lap.  I reviewed the notes from palliative care service and oncology regarding family decision making dynamics. 3.  Microcytic anemia: Multifactorial from chronic kidney disease/sickle cell and colon cancer.  Continue surveillance of iron stores for intravenous supplementation.  Unclear if risk-benefit analysis of ESA favors benefit. 4.  Postoperative ileus: Without bowel movement and with some improvement of abdominal pain-on clear liquids.  Leukocytosis continues to improve 5.  Hypertension: Likely exacerbated by pain, will continue to follow to decide on need for antihypertensive therapy.  Subjective:   Reports some intermittent abdominal pain overnight, on clear liquids at this time and has not had bowel movement.  He reports that he plans to pursue hemodialysis when  indicated.   Objective:   BP (!) 144/77 (BP Location: Right Arm)   Pulse 81   Temp 97.7 F (36.5 C) (Oral)   Resp 20   Ht 6\' 1"  (1.854 m)   Wt 64 kg   SpO2 98%   BMI 18.62 kg/m   Intake/Output Summary (Last 24 hours) at 03/25/2019 0847 Last data filed at 03/25/2019 0500 Gross per 24 hour  Intake -  Output 400 ml  Net -400 ml   Weight change:   Physical Exam: Gen: Appears comfortable resting in bed, eating breakfast CVS: Pulse regular rhythm, normal rate, S1 and S2 normal Resp: Clear to auscultation, no rales/rhonchi.  Right IJ PICC line. Abd: Mildly distended, guarding and tenderness present. Ext: No lower extremity edema.  Left upper arm AVG with palpable thrill and strong bruit.  Imaging: Dg Abd Portable 1v  Result Date: 03/23/2019 CLINICAL DATA:  NG tube placement; exploratory laparotomy performed 03/21/2019 EXAM: PORTABLE ABDOMEN - 1 VIEW COMPARISON:  CT abdomen pelvis-03/15/2019 FINDINGS: Enteric tube tip and side port project over the left expected location of mid body of the stomach. There is moderate gaseous distension of several loops of small bowel with index loop of small bowel within the right upper abdominal quadrant measuring approximately 3.9 cm in diameter. This finding is associated with a paucity of distal colonic gas within the imaged upper abdomen. Lucency overlying the midline of the upper abdomen likely represents pneumoperitoneum as a sequela of recent postoperative state. Limited visualization of the lower thorax demonstrates bibasilar heterogeneous opacities IMPRESSION: 1. Enteric tube tip and side port project over the expected location of mid body of the stomach. 2. Gaseous distention of the small bowel with paucity of colonic gas within the imaged upper abdomen, potentially representative of  ileus though could be seen in the setting of a developing small-bowel obstruction. Clinical correlation is advised. 3. Suspected pneumoperitoneum within the midline  of the upper abdomen, presumably the sequela of patient's recent postoperative state. Electronically Signed   By: Sandi Mariscal M.D.   On: 03/23/2019 09:10    Labs: BMET Recent Labs  Lab 03/19/19 1026 03/20/19 0433 03/21/19 0402 03/23/19 0326 03/24/19 1229 03/25/19 0407  NA 141 139 139 138 136 139  K 4.2 4.3 4.2 4.8 4.7 4.5  CL 109 107 109 107 107 109  CO2 21* 20* 20* 20* 19* 19*  GLUCOSE 126* 143* 135* 203* 165* 133*  BUN 42* 39* 37* 47* 58* 65*  CREATININE 3.33* 3.08* 3.20* 3.56* 4.23* 4.64*  CALCIUM 9.2 9.1 8.8* 9.4 9.4 9.3  PHOS  --   --   --  4.3 4.2 4.2   CBC Recent Labs  Lab 03/22/19 0323 03/23/19 0326 03/24/19 0351 03/25/19 0407  WBC 21.2* 33.0* 28.2* 25.9*  NEUTROABS 19.3* 27.1* 21.4* 19.5*  HGB 10.1* 11.0* 9.9* 9.1*  HCT 30.8* 33.3* 29.9* 28.1*  MCV 79.2* 78.9* 80.6 80.5  PLT 480* 518* 429* 442*    Medications:    . amLODipine  5 mg Oral Daily  . atorvastatin  20 mg Oral Daily  . carvedilol  25 mg Oral BID WC  . Chlorhexidine Gluconate Cloth  6 each Topical Daily  . folic acid  1 mg Oral Daily  . gabapentin  300 mg Oral TID  . insulin aspart  0-9 Units Subcutaneous TID WC  . mupirocin ointment  1 application Topical BID  . pantoprazole (PROTONIX) IV  40 mg Intravenous Q24H  . sodium chloride flush  10-40 mL Intracatheter Q12H   Elmarie Shiley, MD 03/25/2019, 8:47 AM

## 2019-03-25 NOTE — TOC Progression Note (Signed)
Transition of Care [facility]) - Progression Note    Patient Details  Name: [name] MRN: [id number] Date of Birth: 07/25/1950  [facility] CM/SW Contact  Jacalyn Lefevre Edson Snowball, RN Phone Number: 03/25/2019, 10:36 AM  Clinical Narrative:     Confirmed face sheet information.  from home alone, plans on grandson staying with him at DC, Has walker and 3 in 1 already, has [facility] list but at this time does not want home health.   NCM asked to call daughter, patient did not give permission.   Expected Discharge Plan: Home/Self Care Barriers to Discharge: Continued Medical Work up  Expected Discharge Plan and Services Expected Discharge Plan: Home/Self Care In-house Referral: Clinical Social Work Discharge Planning Services: CM Consult Post Acute Care Choice: [place] arrangements for the past 2 months: Single Family Home                 DME Arranged: N/A         HH Arranged: Patient Refused HH           Social Determinants of Health (SDOH) Interventions    Readmission Risk Interventions Readmission Risk Prevention Plan 03/19/2019 01/18/2019  Transportation Screening Complete Complete  PCP or Specialist Appt within 3-5 Days - Complete  HRI or [place] - Complete  Social Work Consult for [place] Planning/Counseling - Complete  Palliative Care Screening - Not Applicable  Medication Review [employer]) Complete Complete  PCP or Specialist appointment within 3-5 days of discharge Complete -  [facility] or Home Care Consult Complete -  SW Recovery Care/Counseling Consult Complete -  Palliative Care Screening Not Applicable -  Skilled Nursing Facility Complete -  Some recent data might be hidden

## 2019-03-25 NOTE — Progress Notes (Signed)
[facility]/Trauma Progress Note  4 Days Post-Op   Assessment/Plan Hx of colon cancer s/p some type of colon resection in 1982 Hx of sickle cell anemia ESRD not on HD- S/P L upper arm HD graft, Dr. Donnetta Hutching, 10/15 DM HTN HLD CHF Cocaine abuse Anemiaper medicine DNR  Cecal Mass - poorly differentiated carcinoma -colonoscopy10/13showedMalignant tumor in the ascending colon and in the cecum.path showedPoorly differentiated carcinoma - CEA 82.7 - s/pexploratory laparotomy,Dr. Dema Severin, 10/16 -Intraoperative findings were that tumor was unresectable -oncology and palliative care following Post op ileus - dark emesis 10/18, pt pulled out NGT and refused another - no additional N or V - advance diet as tolerated  FEN:FLD VTE: SCD's,chemical prophylaxis per medicine ZY:SAYTK 10/10>>WBC28.2, afebrile Foley:None Follow up:TBD     LOS: 10 days    Subjective: CC: abdominal pain and bloating  No issues overnight. Pt denies N or V. He has not had flatus or a BM.   Objective: Vital signs in last 24 hours: Temp:  [97.7 F (36.5 C)-98.5 F (36.9 C)] 97.7 F (36.5 C) (10/20 0401) Pulse Rate:  [75-81] 81 (10/20 0401) Resp:  [20] 20 (10/20 0401) BP: (115-144)/(69-82) 144/77 (10/20 0401) SpO2:  [97 %-100 %] 98 % (10/20 0401) Last BM Date: 03/21/19  Intake/Output from previous day: 10/19 0701 - 10/20 0700 In: -  Out: 400 [Urine:400] Intake/Output this shift: Total I/O In: 660 [P.O.:660] Out: 150 [Urine:150]  PE: Gen: Alert, NAD, pleasant, cooperative Pulm:Rate andeffort normal Abd: Soft,mild distention, +BS, midline with staples intact appear well and are without signs of infection,generalized TTP withguarding. No peritonitis  Skin: no rashes noted, warm and dry  Anti-infectives: Anti-infectives (From admission, onward)   Start     Dose/Rate Route Frequency Ordered Stop   03/21/19 1545  cefoTEtan (CEFOTAN) 2 g in sodium chloride  0.9 % 100 mL IVPB     2 g 200 mL/hr over 30 Minutes Intravenous To Surgery 03/21/19 1542 03/21/19 1603   03/21/19 1515  piperacillin-tazobactam (ZOSYN) IVPB 2.25 g     2.25 g 100 mL/hr over 30 Minutes Intravenous To Surgery 03/21/19 1504 03/21/19 1532   03/16/19 0000  piperacillin-tazobactam (ZOSYN) IVPB 2.25 g     2.25 g 100 mL/hr over 30 Minutes Intravenous Every 8 hours 03/15/19 1527     03/15/19 1430  piperacillin-tazobactam (ZOSYN) IVPB 3.375 g     3.375 g 12.5 mL/hr over 240 Minutes Intravenous  Once 03/15/19 1423 03/15/19 1915      Lab Results:  Recent Labs    03/24/19 0351 03/25/19 0407  WBC 28.2* 25.9*  HGB 9.9* 9.1*  HCT 29.9* 28.1*  PLT 429* 442*   BMET Recent Labs    03/24/19 1229 03/25/19 0407  NA 136 139  K 4.7 4.5  CL 107 109  CO2 19* 19*  GLUCOSE 165* 133*  BUN 58* 65*  CREATININE 4.23* 4.64*  CALCIUM 9.4 9.3   PT/INR No results for input(s): LABPROT, INR in the last 72 hours. CMP     Component Value Date/Time   NA 139 03/25/2019 0407   K 4.5 03/25/2019 0407   CL 109 03/25/2019 0407   CO2 19 (L) 03/25/2019 0407   GLUCOSE 133 (H) 03/25/2019 0407   BUN 65 (H) 03/25/2019 0407   CREATININE 4.64 (H) 03/25/2019 0407   CALCIUM 9.3 03/25/2019 0407   PROT 7.9 03/15/2019 1204   ALBUMIN 2.0 (L) 03/25/2019 0407   AST 16 03/15/2019 1204   ALT 10 03/15/2019 1204   ALKPHOS  85 03/15/2019 1204   BILITOT 0.5 03/15/2019 1204   GFRNONAA 12 (L) 03/25/2019 0407   GFRAA 14 (L) 03/25/2019 0407   Lipase     Component Value Date/Time   LIPASE 51 03/15/2019 1204    Studies/Results: No results found.   Kalman Drape, [facility] Pager [phone] Cristine Polio, & Friday 7:00am - 4:30pm Thursdays 7:00am -11:30am

## 2019-03-26 DIAGNOSIS — C182 Malignant neoplasm of ascending colon: Secondary | ICD-10-CM | POA: Diagnosis not present

## 2019-03-26 LAB — CBC WITH DIFFERENTIAL/PLATELET
Abs Immature Granulocytes: 0.17 10*3/uL — ABNORMAL HIGH (ref 0.00–0.07)
Basophils Absolute: 0.1 10*3/uL (ref 0.0–0.1)
Basophils Relative: 0 %
Eosinophils Absolute: 1 10*3/uL — ABNORMAL HIGH (ref 0.0–0.5)
Eosinophils Relative: 4 %
HCT: 26 % — ABNORMAL LOW (ref 39.0–52.0)
Hemoglobin: 8.2 g/dL — ABNORMAL LOW (ref 13.0–17.0)
Immature Granulocytes: 1 %
Lymphocytes Relative: 10 %
Lymphs Abs: 2.7 10*3/uL (ref 0.7–4.0)
MCH: 25.3 pg — ABNORMAL LOW (ref 26.0–34.0)
MCHC: 31.5 g/dL (ref 30.0–36.0)
MCV: 80.2 fL (ref 80.0–100.0)
Monocytes Absolute: 2.7 10*3/uL — ABNORMAL HIGH (ref 0.1–1.0)
Monocytes Relative: 10 %
Neutro Abs: 21.3 10*3/uL — ABNORMAL HIGH (ref 1.7–7.7)
Neutrophils Relative %: 75 %
Platelets: 411 10*3/uL — ABNORMAL HIGH (ref 150–400)
RBC: 3.24 MIL/uL — ABNORMAL LOW (ref 4.22–5.81)
RDW: 24.4 % — ABNORMAL HIGH (ref 11.5–15.5)
WBC: 27.8 10*3/uL — ABNORMAL HIGH (ref 4.0–10.5)
nRBC: 0.1 % (ref 0.0–0.2)

## 2019-03-26 LAB — RENAL FUNCTION PANEL
Albumin: 1.8 g/dL — ABNORMAL LOW (ref 3.5–5.0)
Anion gap: 11 (ref 5–15)
BUN: 61 mg/dL — ABNORMAL HIGH (ref 8–23)
CO2: 23 mmol/L (ref 22–32)
Calcium: 8.7 mg/dL — ABNORMAL LOW (ref 8.9–10.3)
Chloride: 105 mmol/L (ref 98–111)
Creatinine, Ser: 4.43 mg/dL — ABNORMAL HIGH (ref 0.61–1.24)
GFR calc Af Amer: 15 mL/min — ABNORMAL LOW (ref >60)
GFR calc non Af Amer: 13 mL/min — ABNORMAL LOW (ref >60)
Glucose, Bld: 148 mg/dL — ABNORMAL HIGH (ref 70–99)
Phosphorus: 3.3 mg/dL (ref 2.5–4.6)
Potassium: 3.8 mmol/L (ref 3.5–5.1)
Sodium: 139 mmol/L (ref 135–145)

## 2019-03-26 LAB — HEMOGLOBIN AND HEMATOCRIT, BLOOD
HCT: 25.3 % — ABNORMAL LOW (ref 39.0–52.0)
Hemoglobin: 8.3 g/dL — ABNORMAL LOW (ref 13.0–17.0)

## 2019-03-26 LAB — GLUCOSE, CAPILLARY
Glucose-Capillary: 156 mg/dL — ABNORMAL HIGH (ref 70–99)
Glucose-Capillary: 146 mg/dL — ABNORMAL HIGH (ref 70–99)
Glucose-Capillary: 140 mg/dL — ABNORMAL HIGH (ref 70–99)
Glucose-Capillary: 184 mg/dL — ABNORMAL HIGH (ref 70–99)

## 2019-03-26 MED ORDER — NYSTATIN 100000 UNIT/ML MT SUSP
5.0000 mL | Freq: Four times a day (QID) | OROMUCOSAL | Status: DC
  Administered 2019-03-26 – 2019-04-03 (×33): 500000 [IU] via ORAL
  Filled 2019-03-26 (×30): qty 5

## 2019-03-26 NOTE — Progress Notes (Signed)
PROGRESS NOTE    [name] Duty  WCH:[id number] DOB: 1950/08/04 DOA: 03/15/2019 PCP: Nolene Ebbs, MD   Brief Narrative:  [name] Parys Elenbaas 68 y.o.malewith medical history significant ofsickle cell anemia (SS disease); ESRDnoton HD; HTN; HLD; DM; and colon CA presenting with n/v. He had Tyrin Herbers vocal cord lesion removed with CO2 laser excision during microlaryngoscopy on 10/7. He went home and then started vomiting the evening of 10.7. No vomiting prior to surgery. It would come and go. He has been losing Krissie Merrick lot of weight and so has been drinking Boost. Unintentional weight loss of about 50 pounds in the last 4-5 months. He started having night sweats recently. No LAD. Slight diarrhea after the surgery, thought it was related to the anesthesia. Normal BMs other than post-operatively. Stools have been black because he has been taking iron. He got 4 units of blood Miasia Crabtree week or two before the surgery, that increased his Hgb from 2.5 up to 8.9. He has not yet started HD, still making urine.  His ex-wife has been caring for him for the last 3 weeks. His colon cancer was 20 years ago. He has never had Quanta Robertshaw colonoscopy in over 25 years.  He was last admitted 8/13-15 for symptomatic anemia from CKD and given IV iron and Epogen.  Upon arrival to ED, patient had c/o abdominal pain and vomiting. Had laryngeal biopsy Bradford Cazier few days ago. WBC 40k. CT read as progression of colon CA at cecum and TI with ?typhlitis.  Patient was admitted under hospitalist service with GI and surgery consultation.  Patient underwent colonoscopy on 03/18/2019 which showed cecum and ascending colon mass which was biopsied.  Patient's hemoglobin dropped from 7.026.5 on 03/15/2019 and he received 1 unit of PRBC transfusion but then again it dropped to 6.5 so he received second unit of transfusion on 03/17/2019.  General surgery consulted cardiology and patient was cleared for surgery from cardiac standpoint.  Palliative  medicine was also consulted per general surgery recommendations.  Patient decided to proceed with surgery.  Nephrology consulted vascular surgery for AV graft which was placed on 03/20/2019.  Patient then underwent exploratory laparotomy by general surgery on 03/21/2019 however his mass was unresectable.  Patient then expressed wishes to see oncology for consideration of radiation and chemotherapy.  Dr. [name] from oncology saw patient and he recommends against chemotherapy at this point in time due to several comorbidities that patient has.  Palliative care was consulted.   Assessment & Plan:   Principal Problem:   Colon cancer ([place]) Active Problems:   Hypertension   Type 2 diabetes mellitus with renal complication (HCC)   CKD (chronic kidney disease) stage 5, GFR less than 15 ml/min (HCC)   Anemia   Malignant neoplasm of cecum (HCC)   Goals of care, counseling/discussion   Thrombocytosis (HCC)   Colon cancer. Patient with remote h/o colon cancer (records not apparently available) without subsequent follow-up, ex-wife reports no colonoscopy in 25 years -Presented with RLQ abdominal pain, and n/v; also with 50 pounds unintentional weight loss in the last 4-5 months and recent night sweats -Imaging indicates recurrent colon cancer in the cecum/TI. This appears to be at least stage 3 with positive LN, No obstruction on CT. CEA level-elevated.  Patient underwent colonoscopy on 03/18/2019 which showed cecum and ascending colon mass which was biopsied.  Patient has now decided to proceed with surgery.  Cardiology cleared the patient for surgery from cardiac standpoint stating he will be high risk candidate due to  other medical problems.  Patient was going to have palliative right hemicolectomy and underwent exploratory laparotomy by general surgery on 03/21/2019 however his mass was unresectable so he was closed back.  Oncology was consulted.  Seen by Dr. [name] who noted pt not candidate for systemic  therapy until significant clinical improvement.  Recommend CT chest without contrast when able to complete staging. Appreciate palliative care assistance.  Will continue to discuss with pt and family.  Patient remains hopeful that he will be strong enough 1 day to be able to start chemotherapy and HD.  As of now, he is wanting aggressive care but wants to remain DNR.  Stage 5 CKD -Patient has been preparing for HD but has not yet started. Nephrology prn order set utilized.  Seen by nephrology and they consulted vascular surgery for temporary HD and fistula in anticipation for hemodialysis.  Patient underwent left upper arm AV fistula placed on 03/20/2019.  Continues to have fair urine output with Teniola Tseng stable creatinine.  Defer further management to nephrology.    Acute blood loss anemia on chronic anemia secondary to colon cancer/lower GI bleed vs sickle cell anemia: -Likely multifactorial including h/o sickle cell disease and stage 5 CKD but most likely due to his recurrence of colon cancer.  Received 1 unit of PRBC on 03/16/2019 and 1 unit again on 03/17/2019.   - downtrending over past few days - follow repeat H/H  Leukocytosis: afebrile, will continue to monitor  HTN -Blood pressure controlled.  Continue Norvasc, Coreg  Chronic diastolic CHF -56/8127 echo with preserved EF and grade 1 diastolic dysfunction -Despite stage 5 CKD, he does not appear to be volume overloaded at this time. -If he becomes volume overloaded, the likely treatment would be HD.  DM -Hemoglobin A1c 6.9 on 03/15/2019.  HoldOnglyza.  Blood sugar reasonable.  Continue SSI.  Follow.  HLD  -Continue Lipitor  Cocaine abuse -Patient denies use since July, but was positive at the time of last admission in August. -UDS negative this time.  Postoperative ileus: Doing well.  Abdominal pain improving.  No nausea.  Passing flatus but no bowel movement.  DVT prophylaxis: SCD Code Status: DNR Family Communication:  none at bedside Disposition Plan: pending further Cas[place] discussions   Consultants:   Palliative  Surgery  GI  Cardiology  Vascular  Oncology  Procedures:   Colonoscopy 10/12  AV graft on 10/15  Antimicrobials:  Anti-infectives (From admission, onward)   Start     Dose/Rate Route Frequency Ordered Stop   03/21/19 1545  cefoTEtan (CEFOTAN) 2 g in sodium chloride 0.9 % 100 mL IVPB     2 g 200 mL/hr over 30 Minutes Intravenous To Surgery 03/21/19 1542 03/21/19 1603   03/21/19 1515  piperacillin-tazobactam (ZOSYN) IVPB 2.25 g     2.25 g 100 mL/hr over 30 Minutes Intravenous To Surgery 03/21/19 1504 03/21/19 1532   03/16/19 0000  piperacillin-tazobactam (ZOSYN) IVPB 2.25 g     2.25 g 100 mL/hr over 30 Minutes Intravenous Every 8 hours 03/15/19 1527 03/25/19 2359   03/15/19 1430  piperacillin-tazobactam (ZOSYN) IVPB 3.375 g     3.375 g 12.5 mL/hr over 240 Minutes Intravenous  Once 03/15/19 1423 03/15/19 1915     Subjective: No complaints today  Objective: Vitals:   03/26/19 0515 03/26/19 0840 03/26/19 1601 03/26/19 2035  BP: (!) 160/82 (!) 147/77 138/74 129/70  Pulse: 75 70 65 73  Resp: 18  17 16   Temp: 98.2 F (36.8 C) 99.1 F (37.3  C) 97.8 F (36.6 C) 98.4 F (36.9 C)  TempSrc: Oral Oral Oral Oral  SpO2: 98% 99% 94% 96%  Weight:      Height:        Intake/Output Summary (Last 24 hours) at 03/26/2019 2044 Last data filed at 03/26/2019 1800 Gross per 24 hour  Intake 3258.15 ml  Output 401 ml  Net 2857.15 ml   Filed Weights   03/18/19 0800 03/21/19 1327  Weight: 64 kg 64 kg    Examination:  General exam: Appears calm and comfortable  Respiratory system: Clear to auscultation. Respiratory effort normal. Cardiovascular system: S1 & S2 heard, RRR.  Gastrointestinal system: midline incision healing well, staples Central nervous system: Alert and oriented. No focal neurological deficits. Extremities: no LEE Skin: No rashes, lesions or  ulcers Psychiatry: Judgement and insight appear normal. Mood & affect appropriate.     Data Reviewed: I have personally reviewed following labs and imaging studies  CBC: Recent Labs  Lab 03/22/19 0323 03/23/19 0326 03/24/19 0351 03/25/19 0407 03/26/19 0327  WBC 21.2* 33.0* 28.2* 25.9* 27.8*  NEUTROABS 19.3* 27.1* 21.4* 19.5* 21.3*  HGB 10.1* 11.0* 9.9* 9.1* 8.2*  HCT 30.8* 33.3* 29.9* 28.1* 26.0*  MCV 79.2* 78.9* 80.6 80.5 80.2  PLT 480* 518* 429* 442* 035*   Basic Metabolic Panel: Recent Labs  Lab 03/21/19 0402 03/23/19 0326 03/24/19 1229 03/25/19 0407 03/26/19 0327  NA 139 138 136 139 139  K 4.2 4.8 4.7 4.5 3.8  CL 109 107 107 109 105  CO2 20* 20* 19* 19* 23  GLUCOSE 135* 203* 165* 133* 148*  BUN 37* 47* 58* 65* 61*  CREATININE 3.20* 3.56* 4.23* 4.64* 4.43*  CALCIUM 8.8* 9.4 9.4 9.3 8.7*  PHOS  --  4.3 4.2 4.2 3.3   GFR: Estimated Creatinine Clearance: 14 mL/min (Mandie Crabbe) (by C-G formula based on SCr of 4.43 mg/dL (H)). Liver Function Tests: Recent Labs  Lab 03/23/19 0326 03/24/19 1229 03/25/19 0407 03/26/19 0327  ALBUMIN 2.2* 2.2* 2.0* 1.8*   No results for input(s): LIPASE, AMYLASE in the last 168 hours. No results for input(s): AMMONIA in the last 168 hours. Coagulation Profile: No results for input(s): INR, PROTIME in the last 168 hours. Cardiac Enzymes: No results for input(s): CKTOTAL, CKMB, CKMBINDEX, TROPONINI in the last 168 hours. BNP (last 3 results) No results for input(s): PROBNP in the last 8760 hours. HbA1C: No results for input(s): HGBA1C in the last 72 hours. CBG: Recent Labs  Lab 03/25/19 2207 03/26/19 0853 03/26/19 1239 03/26/19 1741 03/26/19 2037  GLUCAP 200* 156* 146* 140* 184*   Lipid Profile: No results for input(s): CHOL, HDL, LDLCALC, TRIG, CHOLHDL, LDLDIRECT in the last 72 hours. Thyroid Function Tests: No results for input(s): TSH, T4TOTAL, FREET4, T3FREE, THYROIDAB in the last 72 hours. Anemia Panel: No results for  input(s): VITAMINB12, FOLATE, FERRITIN, TIBC, IRON, RETICCTPCT in the last 72 hours. Sepsis Labs: No results for input(s): PROCALCITON, LATICACIDVEN in the last 168 hours.  Recent Results (from the past 240 hour(s))  Surgical pcr screen     Status: Abnormal   Collection Time: 03/20/19  1:16 AM   Specimen: Nasal Mucosa; Nasal Swab  Result Value Ref Range Status   MRSA, PCR NEGATIVE NEGATIVE Final   Staphylococcus aureus POSITIVE (Jilliana Burkes) NEGATIVE Final    Comment: (NOTE) The Xpert SA Assay (FDA approved for NASAL specimens in patients 54 years of age and older), is one component of [place] [place] comprehensive surveillance program. It is not intended to diagnose infection  nor to guide or monitor treatment. Performed at [facility], [place] [address] [place], [place] [address]          Radiology Studies: No results found.      Scheduled Meds:  amLODipine  5 mg Oral Daily   atorvastatin  20 mg Oral Daily   carvedilol  25 mg Oral BID WC   Chlorhexidine Gluconate Cloth  6 each Topical Daily   feeding supplement (ENSURE ENLIVE)  237 mL Oral TID BM   folic acid  1 mg Oral Daily   gabapentin  300 mg Oral Daily   insulin aspart  0-9 Units Subcutaneous TID WC   multivitamin with minerals  1 tablet Oral Daily   mupirocin ointment  1 application Topical BID   nystatin  5 mL Oral QID   pantoprazole (PROTONIX) IV  40 mg Intravenous Q24H   sodium chloride flush  10-40 mL Intracatheter Q12H   Continuous Infusions:  sodium chloride 10 mL/hr at 03/21/19 1430   sterile water 1,000 mL with sodium bicarbonate 150 mEq infusion 75 mL/hr at 03/26/19 1935     LOS: 11 days    Time spent: over 30 min    Fayrene Helper, MD [facility] Pager AMION  If 7PM-7AM, please contact night-coverage [email or web address] Password [facility] 03/26/2019, 8:44 PM

## 2019-03-26 NOTE — Progress Notes (Signed)
Patient ID: [name], male   DOB: Oct 21, 1950, 68 y.o.   MRN: [id number]  [facility] Progress Note   Assessment/ Plan:   1.  Chronic kidney disease stage IV-V: With multifactorial underlying chronic kidney disease from diabetes, hypertension, history of cocaine use and sickle cell disease.  Creatinine has been at least 3.5 since 2017.   Status post left upper arm arteriovenous graft placement on 03/20/2019 after primary failure of previously placed access.  Some worsening BUN/creatinine noted this hospitalization which likely was hemodynamically mediated with his ileus/restricted oral intake/third spacing-now with some oral intake which we are supplementing with intravenous fluids.  Thankfully looks a little better with inc UOP and better numbers.  No acute indications for dialysis at this time.  I truly do not see a scenario where I would recommend dialysis in this situation as it will not improve his quality of life. 2.  Recurrent colon cancer: With new cecal mass and biopsy showing metastatic poorly differentiated adenocarcinoma of the ascending colon with metastases that is inoperable.  Plans noted by Dr. [name] of oncology to offer palliative chemotherapy after additional imaging for staging and allowing improvement of patient clinical status/recovery from recent ex lap.  I reviewed the notes from palliative care service and oncology regarding family decision making dynamics. Saying he is not strong enough for chemo at this time, wondering if he ever would be strong enough ? 3.  Microcytic anemia: Multifactorial from chronic kidney disease/sickle cell and colon cancer.  Continue surveillance of iron stores for intravenous supplementation.  No ESA due to active cancer 4.  Postoperative ileus: Without bowel movement and with some improvement of abdominal pain-on clear liquids.  Leukocytosis continues to improve 5.  Hypertension: cont norvasc and coreg right now - no change- OK to cont  IVF as is not volume overloaded  Subjective:   Reports some intermittent abdominal pain overnight, tried some solid food with not great results.  He has been ambulating in the room    Objective:   BP (!) 147/77 (BP Location: Right Arm)   Pulse 70   Temp 99.1 F (37.3 C) (Oral)   Resp 18   Ht 6\' 1"  (1.854 m)   Wt 64 kg   SpO2 99%   BMI 18.62 kg/m   Intake/Output Summary (Last 24 hours) at 03/26/2019 1003 Last data filed at 03/26/2019 0515 Gross per 24 hour  Intake 1802.23 ml  Output 475 ml  Net 1327.23 ml   Weight change:   Physical Exam: Gen: Appears comfortable  CVS: Pulse regular rhythm, normal rate, S1 and S2 normal Resp: Clear to auscultation, no rales/rhonchi.  Right IJ PICC line. Abd: Mildly distended, guarding and tenderness present. Ext: No lower extremity edema.  Left upper arm AVG with palpable thrill and strong bruit.  Imaging: No results found.  Labs: BMET Recent Labs  Lab 03/19/19 1026 03/20/19 0433 03/21/19 0402 03/23/19 0326 03/24/19 1229 03/25/19 0407 03/26/19 0327  NA 141 139 139 138 136 139 139  K 4.2 4.3 4.2 4.8 4.7 4.5 3.8  CL 109 107 109 107 107 109 105  CO2 21* 20* 20* 20* 19* 19* 23  GLUCOSE 126* 143* 135* 203* 165* 133* 148*  BUN 42* 39* 37* 47* 58* 65* 61*  CREATININE 3.33* 3.08* 3.20* 3.56* 4.23* 4.64* 4.43*  CALCIUM 9.2 9.1 8.8* 9.4 9.4 9.3 8.7*  PHOS  --   --   --  4.3 4.2 4.2 3.3   CBC Recent Labs  Lab  03/23/19 0326 03/24/19 0351 03/25/19 0407 03/26/19 0327  WBC 33.0* 28.2* 25.9* 27.8*  NEUTROABS 27.1* 21.4* 19.5* 21.3*  HGB 11.0* 9.9* 9.1* 8.2*  HCT 33.3* 29.9* 28.1* 26.0*  MCV 78.9* 80.6 80.5 80.2  PLT 518* 429* 442* 411*    Medications:    . amLODipine  5 mg Oral Daily  . atorvastatin  20 mg Oral Daily  . carvedilol  25 mg Oral BID WC  . Chlorhexidine Gluconate Cloth  6 each Topical Daily  . feeding supplement (ENSURE ENLIVE)  237 mL Oral TID BM  . folic acid  1 mg Oral Daily  . gabapentin  300 mg Oral  Daily  . insulin aspart  0-9 Units Subcutaneous TID WC  . multivitamin with minerals  1 tablet Oral Daily  . mupirocin ointment  1 application Topical BID  . nystatin  5 mL Oral QID  . pantoprazole (PROTONIX) IV  40 mg Intravenous Q24H  . sodium chloride flush  10-40 mL Intracatheter Q12H  Ayelet Gruenewald A Pike Scantlebury  03/26/2019, 10:03 AM

## 2019-03-26 NOTE — Plan of Care (Signed)
  Problem: Education: Goal: Knowledge of General Education information will improve Description Including pain rating scale, medication(s)/side effects and non-pharmacologic comfort measures Outcome: Progressing   

## 2019-03-26 NOTE — Progress Notes (Signed)
Physical Therapy Treatment Patient Details Name: [name] MRN: [id number] DOB: [birth year] Today's Date: 03/26/2019    History of Present Illness Pt is a 68 y/o male admitted secondary to n/v and RLQ abdominal pain. Imaging indicates recurrent colon cancer in the cecum/TI. Colonoscopy on 10/13 showed malignant tumor in the ascending colon and in the cecum. path showed poorly differentiated carcinoma. Pt underwent exploratory laparotomy surgery on 03/21/19; however, intraoperative findings were that tumor was unresectable. Consults to oncology and palliative were recommended. PMH including but not limited to colon cancer s/p some type of colon resection in 1982, CKD stage 5, DM, HTN, CHF and cocaine abuse.    PT Comments    Patient received in bed, pleasant and willing to work with therapy, although declining out of room due to "having been up a lot with nursing to go to the bathroom". Mobility improving overall, able to perform bed mobility with min guard, functional transfers with min guard and RW, gait approximately 38f in room with RW and min guard. Continues to require cues for safety and sequencing throughout session. He was left up in the chair with all needs met, chair alarm active and RN Present this morning.     Follow Up Recommendations  Home health PT     Equipment Recommendations  3in1 (PT)    Recommendations for Other Services       Precautions / Restrictions Precautions Precautions: Fall Precaution Comments: abdominal pain Restrictions Weight Bearing Restrictions: No    Mobility  Bed Mobility Overal bed mobility: Needs Assistance Bed Mobility: Supine to Sit   Sidelying to sit: Min guard       General bed mobility comments: min guard, extended time  Transfers Overall transfer level: Needs assistance Equipment used: Rolling walker (2 wheeled) Transfers: Sit to/from Stand Sit to Stand: Min guard         General transfer comment: min guard for safety,  cues for sequencing and hand placement  Ambulation/Gait Ambulation/Gait assistance: Min guard Gait Distance (Feet): 10 Feet Assistive device: Rolling walker (2 wheeled) Gait Pattern/deviations: Step-through pattern;Trunk flexed Gait velocity: decreased   General Gait Details: slow and steady with RW, declines gait outside of room due to "having been walked to death with nursing staff going to and from bathroom", agreeable to up to chair only   Stairs             Wheelchair Mobility    Modified Rankin (Stroke Patients Only)       Balance Overall balance assessment: Mild deficits observed, not formally tested Sitting-balance support: Feet supported Sitting balance-Leahy Scale: Good     Standing balance support: Bilateral upper extremity supported;During functional activity Standing balance-Leahy Scale: Fair                              Cognition Arousal/Alertness: Awake/alert Behavior During Therapy: WFL for tasks assessed/performed Overall Cognitive Status: No family/caregiver present to determine baseline cognitive functioning Area of Impairment: Memory;Following commands;Safety/judgement;Problem solving                     Memory: Decreased short-term memory Following Commands: Follows one step commands inconsistently;Follows one step commands with increased time Safety/Judgement: Decreased awareness of deficits;Decreased awareness of safety   Problem Solving: Requires verbal cues General Comments: cognition improving today, much more alert but continues to require cues for safety      Exercises      General Comments  Pertinent Vitals/Pain Pain Assessment: Faces Faces Pain Scale: Hurts a little bit Pain Location: abdomen with movement Pain Intervention(s): Limited activity within patient's tolerance;Monitored during session;RN gave pain meds during session    Home Living                      Prior Function             PT Goals (current goals can now be found in the care plan section) Acute Rehab PT Goals Patient Stated Goal: to find out what else can be done to treat his cancer PT Goal Formulation: With patient Time For Goal Achievement: 04/05/19 Potential to Achieve Goals: Fair Progress towards PT goals: Progressing toward goals    Frequency    Min 3X/week      PT Plan Current plan remains appropriate    Co-evaluation              AM-PAC PT "6 Clicks" Mobility   Outcome Measure  Help needed turning from your back to your side while in a flat bed without using bedrails?: None Help needed moving from lying on your back to sitting on the side of a flat bed without using bedrails?: None Help needed moving to and from a bed to a chair (including a wheelchair)?: A Little Help needed standing up from a chair using your arms (e.g., wheelchair or bedside chair)?: A Little Help needed to walk in hospital room?: A Little Help needed climbing 3-5 steps with a railing? : A Little 6 Click Score: 20    End of Session   Activity Tolerance: Patient tolerated treatment well Patient left: in chair;with chair alarm set;with call bell/phone within reach;with nursing/sitter in room   PT Visit Diagnosis: Muscle weakness (generalized) (M62.81);Pain;Other abnormalities of gait and mobility (R26.89) Pain - Right/Left: (abdomen) Pain - part of body: (abdomen)     Time: 6861-6837 PT Time Calculation (min) (ACUTE ONLY): 18 min  Charges:  $Therapeutic Activity: 8-22 mins                     Deniece Ree PT, DPT, CBIS  Supplemental Physical Therapist [place]    Pager [phone] Acute Rehab Office [phone]

## 2019-03-26 NOTE — Progress Notes (Signed)
Pt has thrush, Dr. Florene Glen notified.

## 2019-03-26 NOTE — Progress Notes (Addendum)
[facility]/Trauma Progress Note  5 Days Post-Op   Assessment/Plan Hx of colon cancer s/p some type of colon resection in 1982 Hx of sickle cell anemia ESRD not on HD- S/P L upper arm HD graft, Dr. Donnetta Hutching, 10/15 DM HTN HLD CHF Cocaine abuse Anemiaper medicine DNR  Cecal Mass - poorly differentiated carcinoma -colonoscopy10/13showedMalignant tumor in the ascending colon and in the cecum.path showedPoorly differentiated carcinoma - CEA 82.7 - s/pexploratory laparotomy,Dr. Dema Severin, 10/16 -Intraoperative findings were that tumor was unresectable -oncology and palliative care following Post op ileus - dark emesis10/18,pt pulled out NGT and refused another - no additional N or V - advance diet as tolerated  LZJ:QBHA diet VTE: SCD's,chemical prophylaxis per medicine but ok from our standpoint LP:FXTKW 10/10-10/20 Foley:None Follow up:TBD   Plan: discharge planning per medicine but if pt tolerates a soft diet he will be okay for discharge from a surgical standpoint    LOS: 11 days    Subjective: CC: abdominal pain  Pain not worse but not better. No nausea or vomiting. He does not feel bloated. He is having flatus. No issues overnight.   Objective: Vital signs in last 24 hours: Temp:  [98.2 F (36.8 C)-99.1 F (37.3 C)] 98.2 F (36.8 C) (10/21 0515) Pulse Rate:  [75-78] 75 (10/21 0515) Resp:  [17-18] 18 (10/21 0515) BP: (130-160)/(67-82) 160/82 (10/21 0515) SpO2:  [94 %-98 %] 98 % (10/21 0515) Last BM Date: 03/25/19  Intake/Output from previous day: 10/20 0701 - 10/21 0700 In: 2462.2 [P.O.:1140; I.V.:1222.2; IV Piggyback:100] Out: 625 [Urine:625] Intake/Output this shift: No intake/output data recorded.  PE: Gen: Alert, NAD, pleasant, cooperative Pulm:Rate andeffort normal Abd: Soft,very mild distention,+BS,midline with staples intact appear well and are without signs of infection,generalized TTP withguarding.No  peritonitis Skin: no rashes noted, warm and dry   Anti-infectives: Anti-infectives (From admission, onward)   Start     Dose/Rate Route Frequency Ordered Stop   03/21/19 1545  cefoTEtan (CEFOTAN) 2 g in sodium chloride 0.9 % 100 mL IVPB     2 g 200 mL/hr over 30 Minutes Intravenous To Surgery 03/21/19 1542 03/21/19 1603   03/21/19 1515  piperacillin-tazobactam (ZOSYN) IVPB 2.25 g     2.25 g 100 mL/hr over 30 Minutes Intravenous To Surgery 03/21/19 1504 03/21/19 1532   03/16/19 0000  piperacillin-tazobactam (ZOSYN) IVPB 2.25 g     2.25 g 100 mL/hr over 30 Minutes Intravenous Every 8 hours 03/15/19 1527 03/25/19 2359   03/15/19 1430  piperacillin-tazobactam (ZOSYN) IVPB 3.375 g     3.375 g 12.5 mL/hr over 240 Minutes Intravenous  Once 03/15/19 1423 03/15/19 1915      Lab Results:  Recent Labs    03/25/19 0407 03/26/19 0327  WBC 25.9* 27.8*  HGB 9.1* 8.2*  HCT 28.1* 26.0*  PLT 442* 411*   BMET Recent Labs    03/25/19 0407 03/26/19 0327  NA 139 139  K 4.5 3.8  CL 109 105  CO2 19* 23  GLUCOSE 133* 148*  BUN 65* 61*  CREATININE 4.64* 4.43*  CALCIUM 9.3 8.7*   PT/INR No results for input(s): LABPROT, INR in the last 72 hours. CMP     Component Value Date/Time   NA 139 03/26/2019 0327   K 3.8 03/26/2019 0327   CL 105 03/26/2019 0327   CO2 23 03/26/2019 0327   GLUCOSE 148 (H) 03/26/2019 0327   BUN 61 (H) 03/26/2019 0327   CREATININE 4.43 (H) 03/26/2019 0327   CALCIUM 8.7 (L) 03/26/2019 0327  PROT 7.9 03/15/2019 1204   ALBUMIN 1.8 (L) 03/26/2019 0327   AST 16 03/15/2019 1204   ALT 10 03/15/2019 1204   ALKPHOS 85 03/15/2019 1204   BILITOT 0.5 03/15/2019 1204   GFRNONAA 13 (L) 03/26/2019 0327   GFRAA 15 (L) 03/26/2019 0327   Lipase     Component Value Date/Time   LIPASE 51 03/15/2019 1204    Studies/Results: No results found.   Kalman Drape, [facility] Pager [phone] Cristine Pol[place], & Friday 7:00am - 4:30pm Thursdays 7:00am  -11:30am

## 2019-03-26 NOTE — Progress Notes (Signed)
Occupational Therapy Treatment Patient Details Name: [name] MRN: [id number] DOB: Nov 21, 1950 Today's Date: 03/26/2019    History of present illness Pt is a 68 y/o male admitted secondary to n/v and RLQ abdominal pain. Imaging indicates recurrent colon cancer in the cecum/TI. Colonoscopy on 10/13 showed malignant tumor in the ascending colon and in the cecum. path showed poorly differentiated carcinoma. Pt underwent exploratory laparotomy surgery on 03/21/19; however, intraoperative findings were that tumor was unresectable. Consults to oncology and palliative were recommended. PMH including but not limited to colon cancer s/p some type of colon resection in 1982, CKD stage 5, DM, HTN, CHF and cocaine abuse.   OT comments  Pt able to reach feet from EOB for LB dressing but reports increased pain when reaching forward.Pt declined OOB transfer d/t fatigue from already working with PT. Provided handout and education on energy conservation strategies to incorporate into ADL routine at home. Pt receptive to education and verbalizes understanding. Visual demo'ed use of 3n1 as shower seat with pt verbalizing understanding. DC plan remains appropriate. Will continue to follow acutely for OT needs.   Follow Up Recommendations  Home health OT;Supervision/Assistance - 24 hour    Equipment Recommendations  None recommended by OT    Recommendations for Other Services      Precautions / Restrictions Precautions Precautions: Fall Precaution Comments: abdominal pain Restrictions Weight Bearing Restrictions: No       Mobility Bed Mobility Overal bed mobility: Needs Assistance Bed Mobility: Supine to Sit;Sit to Supine   Sidelying to sit: Min guard Supine to sit: Supervision Sit to supine: Supervision   General bed mobility comments: no physical assist; use of bed features  Transfers Overall transfer level: Needs assistance Equipment used: Rolling walker (2 wheeled) Transfers: Sit to/from  Stand Sit to Stand: Min guard         General transfer comment: declined OOB transfer    Balance Overall balance assessment: Needs assistance Sitting-balance support: Feet supported Sitting balance-Leahy Scale: Good Sitting balance - Comments: able to reach down to touch feet from EOB   Standing balance support: Bilateral upper extremity supported;During functional activity Standing balance-Leahy Scale: Fair                             ADL either performed or assessed with clinical judgement   ADL Overall ADL's : Needs assistance/impaired                     Lower Body Dressing: Supervision/safety;Sit to/from stand Lower Body Dressing Details (indicate cue type and reason): pt able to reach feet from sitting EOb but reports pain in abd   Toilet Transfer Details (indicate cue type and reason): declined OOB transfer       Tub/Shower Transfer Details (indicate cue type and reason): demo'ed use of 3n1 as shower seat with pt verbalizing understanding   General ADL Comments: Pt with increased abd pain and declined ADL participation; session focus on EC strategy education     Vision Patient Visual Report: No change from baseline     Perception     Praxis      Cognition Arousal/Alertness: Awake/alert Behavior During Therapy: WFL for tasks assessed/performed Overall Cognitive Status: No family/caregiver present to determine baseline cognitive functioning Area of Impairment: Memory;Following commands;Problem solving                     Memory: Decreased short-term memory Following Commands: Follows  one step commands inconsistently;Follows one step commands with increased time Safety/Judgement: Decreased awareness of deficits;Decreased awareness of safety   Problem Solving: Requires verbal cues General Comments: cognition improving today, much more alert but continues to require cues for safety; very cooperative and receptive to education         Exercises     Shoulder Instructions       General Comments      Pertinent Vitals/ Pain       Pain Assessment: Faces Faces Pain Scale: Hurts even more Pain Location: abdomen with movement Pain Descriptors / Indicators: Sore;Grimacing;Guarding Pain Intervention(s): Limited activity within patient's tolerance;Monitored during session;Repositioned  Home Living                                          Prior Functioning/Environment              Frequency  Min 2X/week        Progress Toward Goals  OT Goals(current goals can now be found in the care plan section)  Progress towards OT goals: Progressing toward goals  Acute Rehab OT Goals Patient Stated Goal: to find out what else can be done to treat his cancer OT Goal Formulation: With patient Time For Goal Achievement: 04/07/19 Potential to Achieve Goals: [place] Discharge plan remains appropriate    Co-evaluation                 AM-PAC OT "6 Clicks" Daily Activity     Outcome Measure   Help from another person eating meals?: None Help from another person taking care of personal grooming?: A Little Help from another person toileting, which includes using toliet, bedpan, or urinal?: A Little Help from another person bathing (including washing, rinsing, drying)?: A Little Help from another person to put on and taking off regular upper body clothing?: None Help from another person to put on and taking off regular lower body clothing?: A Lot 6 Click Score: 19    End of Session    OT Visit Diagnosis: Unsteadiness on feet (R26.81);Other abnormalities of gait and mobility (R26.89);Pain;Muscle weakness (generalized) (M62.81)   Activity Tolerance Patient limited by pain   Patient Left in bed;with call bell/phone within reach   Nurse Communication Mobility status        Time: 3664-4034 OT Time Calculation (min): 20 min  Charges: OT General Charges $OT Visit: 1 Visit OT  Treatments $Self Care/Home Management : 8-22 mins  [place], [place] [phone] [place] 03/26/2019, 3:09 PM

## 2019-03-27 ENCOUNTER — Inpatient Hospital Stay (HOSPITAL_COMMUNITY): Payer: Medicare HMO

## 2019-03-27 DIAGNOSIS — D649 Anemia, unspecified: Secondary | ICD-10-CM | POA: Diagnosis not present

## 2019-03-27 DIAGNOSIS — Z7189 Other specified counseling: Secondary | ICD-10-CM | POA: Diagnosis not present

## 2019-03-27 DIAGNOSIS — C182 Malignant neoplasm of ascending colon: Secondary | ICD-10-CM | POA: Diagnosis not present

## 2019-03-27 DIAGNOSIS — D473 Essential (hemorrhagic) thrombocythemia: Secondary | ICD-10-CM | POA: Diagnosis not present

## 2019-03-27 LAB — RENAL FUNCTION PANEL
Albumin: 1.9 g/dL — ABNORMAL LOW (ref 3.5–5.0)
Anion gap: 12 (ref 5–15)
BUN: 49 mg/dL — ABNORMAL HIGH (ref 8–23)
CO2: 27 mmol/L (ref 22–32)
Calcium: 8.5 mg/dL — ABNORMAL LOW (ref 8.9–10.3)
Chloride: 99 mmol/L (ref 98–111)
Creatinine, Ser: 3.51 mg/dL — ABNORMAL HIGH (ref 0.61–1.24)
GFR calc Af Amer: 19 mL/min — ABNORMAL LOW (ref >60)
GFR calc non Af Amer: 17 mL/min — ABNORMAL LOW (ref >60)
Glucose, Bld: 143 mg/dL — ABNORMAL HIGH (ref 70–99)
Phosphorus: 3.4 mg/dL (ref 2.5–4.6)
Potassium: 3.4 mmol/L — ABNORMAL LOW (ref 3.5–5.1)
Sodium: 138 mmol/L (ref 135–145)

## 2019-03-27 LAB — GLUCOSE, CAPILLARY
Glucose-Capillary: 184 mg/dL — ABNORMAL HIGH (ref 70–99)
Glucose-Capillary: 148 mg/dL — ABNORMAL HIGH (ref 70–99)
Glucose-Capillary: 127 mg/dL — ABNORMAL HIGH (ref 70–99)
Glucose-Capillary: 184 mg/dL — ABNORMAL HIGH (ref 70–99)

## 2019-03-27 LAB — CBC
HCT: 26.1 % — ABNORMAL LOW (ref 39.0–52.0)
Hemoglobin: 8.3 g/dL — ABNORMAL LOW (ref 13.0–17.0)
MCH: 25.5 pg — ABNORMAL LOW (ref 26.0–34.0)
MCHC: 31.8 g/dL (ref 30.0–36.0)
MCV: 80.1 fL (ref 80.0–100.0)
Platelets: 450 10*3/uL — ABNORMAL HIGH (ref 150–400)
RBC: 3.26 MIL/uL — ABNORMAL LOW (ref 4.22–5.81)
RDW: 23.9 % — ABNORMAL HIGH (ref 11.5–15.5)
WBC: 22.3 10*3/uL — ABNORMAL HIGH (ref 4.0–10.5)
nRBC: 0.1 % (ref 0.0–0.2)

## 2019-03-27 LAB — MAGNESIUM: Magnesium: 2.1 mg/dL (ref 1.7–2.4)

## 2019-03-27 MED ORDER — PANTOPRAZOLE SODIUM 40 MG PO TBEC
40.0000 mg | DELAYED_RELEASE_TABLET | Freq: Every day | ORAL | Status: DC
  Administered 2019-03-28 – 2019-04-03 (×7): 40 mg via ORAL
  Filled 2019-03-27 (×7): qty 1

## 2019-03-27 MED ORDER — SODIUM CHLORIDE 0.9 % IV SOLN
510.0000 mg | Freq: Once | INTRAVENOUS | Status: AC
  Administered 2019-03-27: 510 mg via INTRAVENOUS
  Filled 2019-03-27: qty 17

## 2019-03-27 MED ORDER — BOOST / RESOURCE BREEZE PO LIQD CUSTOM
1.0000 | Freq: Three times a day (TID) | ORAL | Status: DC
  Administered 2019-03-27 – 2019-04-03 (×10): 1 via ORAL

## 2019-03-27 NOTE — TOC Progression Note (Signed)
Transition of Care [facility]) - Progression Note    Patient Details  Name: [name] MRN: [id number] Date of Birth: [birth year]  Transition of Care [facility]) CM/SW Contact  Jacalyn Lefevre Edson [place], RN Phone Number: 03/27/2019, 1:36 PM  Clinical Narrative:     Spoke to patient again regarding home health. He has not made a decision on agency. Patient does have [email or web address] home health agency list. Will continue to follow.  Expected Discharge Plan: Home/Self Care Barriers to Discharge: Continued Medical Work up  Expected Discharge Plan and Services Expected Discharge Plan: Home/Self Care In-house Referral: Clinical Social Work Discharge Planning Services: CM Consult Post Acute Care Choice: [place] arrangements for the past 2 months: Single Family Home                 DME Arranged: N/A         HH Arranged: Patient Refused HH           Social Determinants of Health (SDOH) Interventions    Readmission Risk Interventions Readmission Risk Prevention Plan 03/19/2019 01/18/2019  Transportation Screening Complete Complete  PCP or Specialist Appt within 3-5 Days - Complete  HRI or [place] - Complete  Social Work Consult for [place] Planning/Counseling - Complete  Palliative Care Screening - Not Applicable  Medication Review [employer]) Complete Complete  PCP or Specialist appointment within 3-5 days of discharge Complete -  [place] or Home Care Consult Complete -  SW Recovery Care/Counseling Consult Complete -  Palliative Care Screening Not Applicable -  Skilled Nursing Facility Complete -  Some recent data might be hidden

## 2019-03-27 NOTE — Progress Notes (Signed)
HEMATOLOGY-ONCOLOGY PROGRESS NOTE  SUBJECTIVE: The patient has had ongoing abdominal discomfort.  Reports bowels are moving.  He denies nausea and vomiting.  States that he is trying to take in boost or Ensure 1-2 bottles per day.  He is eating small amounts of his meals.  He is being followed closely by the dietitian.  Still reports swelling in his feet.  REVIEW OF SYSTEMS:   Constitutional: reports fatigue, weakness, poor appetite.  Respiratory: Denies cough, dyspnea or wheezes Cardiovascular: Denies palpitation, chest discomfort.   Gastrointestinal:  Has mild abdominal pain. No nausea or vomiting.  Bowels are moving. Skin: Denies abnormal skin rashes Lymphatics: Denies new lymphadenopathy or easy bruising Neurological:Denies numbness, tingling or new weaknesses Behavioral/Psych: Mood is stable, no new changes  Extremities: reports swelling to his feet.  All other systems were reviewed with the patient and are negative.  I have reviewed the past medical history, past surgical history, social history and family history with the patient and they are unchanged from previous note.   PHYSICAL EXAMINATION:  Vitals:   03/27/19 0823 03/27/19 1354  BP: (!) 156/81 (!) 143/78  Pulse: 79 79  Resp:  15  Temp:  98.6 F (37 C)  SpO2:  96%   Filed Weights   03/18/19 0800 03/21/19 1327  Weight: 141 lb 1.5 oz (64 kg) 141 lb 1.5 oz (64 kg)    Intake/Output from previous day: 10/21 0701 - 10/22 0700 In: 3104.9 [P.O.:680; I.V.:2424.9] Out: 1 [Stool:1]  GENERAL:chronically ill appearing, cachectic LUNGS: clear to auscultation and percussion with normal breathing effort HEART: regular rate & rhythm and no murmurs. 1+ pedal edema bilaterally ABDOMEN: positive BS, soft, midline with staples, no redness or drainage Musculoskeletal:no cyanosis of digits and no clubbing  NEURO: alert & oriented x 3 with fluent speech, no focal motor/sensory deficits  LABORATORY DATA:  I have reviewed the data  as listed CMP Latest Ref Rng & Units 03/27/2019 03/26/2019 03/25/2019  Glucose 70 - 99 mg/dL 143(H) 148(H) 133(H)  BUN 8 - 23 mg/dL 49(H) 61(H) 65(H)  Creatinine 0.61 - 1.24 mg/dL 3.51(H) 4.43(H) 4.64(H)  Sodium 135 - 145 mmol/L 138 139 139  Potassium 3.5 - 5.1 mmol/L 3.4(L) 3.8 4.5  Chloride 98 - 111 mmol/L 99 105 109  CO2 22 - 32 mmol/L 27 23 19(L)  Calcium 8.9 - 10.3 mg/dL 8.5(L) 8.7(L) 9.3  Total Protein 6.5 - 8.1 g/dL - - -  Total Bilirubin 0.3 - 1.2 mg/dL - - -  Alkaline Phos 38 - 126 U/L - - -  AST 15 - 41 U/L - - -  ALT 0 - 44 U/L - - -    Lab Results  Component Value Date   WBC 22.3 (H) 03/27/2019   HGB 8.3 (L) 03/27/2019   HCT 26.1 (L) 03/27/2019   MCV 80.1 03/27/2019   PLT 450 (H) 03/27/2019   NEUTROABS 21.3 (H) 03/26/2019    Ct Abdomen Pelvis Wo Contrast  Result Date: 03/15/2019 CLINICAL DATA:  Emesis, right lower quadrant pain, history of colon carcinoma EXAM: CT ABDOMEN AND PELVIS WITHOUT CONTRAST TECHNIQUE: Multidetector CT imaging of the abdomen and pelvis was performed following the standard protocol without IV contrast. COMPARISON:  02/09/2018 and previous FINDINGS: Lower chest: Linear scarring or subsegmental atelectasis posteriorly in the lower lobes as before. No pleural or pericardial effusion. Hepatobiliary: No focal liver abnormality is seen. Status post cholecystectomy. No biliary dilatation. Pancreas: Unremarkable. No pancreatic ductal dilatation or surrounding inflammatory changes. Spleen: Small with coarse calcifications  as before. Adrenals/Urinary Tract: Adrenal glands are unremarkable. Kidneys are normal, without renal calculi, focal lesion, or hydronephrosis. Bladder is unremarkable. Stomach/Bowel: Stomach decompressed. Small bowel decompressed. Marked wall thickening involving the terminal ileum and cecum without obstruction. Mild adjacent inflammatory/edematous changes. Regional adenopathy measured up to 3.2 cm , some nodes containing calcifications. The  remainder of the colon is unremarkable. Vascular/Lymphatic: Right lower quadrant and central mesenteric adenopathy as above. No retroperitoneal or pelvic adenopathy. No significant vascular pathology identified. Reproductive: Uterus and bilateral adnexa are unremarkable. Other: Bilateral pelvic phleboliths.  No ascites.  No free air. Musculoskeletal: Extensive mottled lytic/sclerotic appearance of all visualized bones, probably related to sickle cell disease, present on studies dating back to 11/04/2016. No fracture. IMPRESSION: 1. Marked progression of wall thickening in the cecum, now also involving terminal ileum, with progressive regional adenopathy, suggesting progression of colon carcinoma. Cannot exclude superimposed typhlitis given the clinical presentation and regional inflammatory/edematous change. 2. Chronic changes of sickle cell disease in visualized bones and spleen. Electronically Signed   By: Lucrezia Europe M.D.   On: 03/15/2019 13:59   Ct Chest Wo Contrast  Result Date: 03/27/2019 CLINICAL DATA:  Pt c/o upper midline chest pain; pt denies cough, or SOB. Sickle cell disease. EXAM: CT CHEST WITHOUT CONTRAST TECHNIQUE: Multidetector CT imaging of the chest was performed following the standard protocol without IV contrast. COMPARISON:  CT abdomen 03/15/2019 FINDINGS: Cardiovascular: RIGHT central venous line. No acute cardiac findings. No pericardial fluid. Mediastinum/Nodes: No axillary or supraclavicular adenopathy. No mediastinal hilar adenopathy. No pericardial effusion. Esophagus normal. Lungs/Pleura: New LEFT pleural effusion and basilar atelectasis compared to CT 03/15/2019. Mild RIGHT basilar atelectasis also increased. No focal infiltrate. No pneumothorax. 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 Musculoskeletal: Diffuse sclerosis throughout the entirety of the skeleton consistent sickle cell change. IMPRESSION: 1. New LEFT pleural  effusion and basilar atelectasis. No clear evidence pneumonia. 2. Mild atelectasis at the RIGHT lung base is also new. 3. Sequelae of sickle cell disease with infarcted spleen and dense sclerosis of the bones. Electronically Signed   By: Suzy Bouchard M.D.   On: 03/27/2019 09:44   Ir Fluoro Guide Cv Line Right  Result Date: 03/18/2019 INDICATION: 68 year old male referred for central line placement EXAM: IMAGE GUIDED PLACEMENT OF CENTRAL VENOUS CATHETER MEDICATIONS: NONE ANESTHESIA/SEDATION: NONE FLUOROSCOPY TIME:  Fluoroscopy Time: 0 minutes 12 seconds (1 mGy). COMPLICATIONS: NONE PROCEDURE: Informed written consent was obtained from the patient after a thorough discussion of the procedural risks, benefits and alternatives. All questions were addressed. Maximal Sterile Barrier Technique was utilized including caps, mask, sterile gowns, sterile gloves, sterile drape, hand hygiene and skin antiseptic. A timeout was performed prior to the initiation of the procedure. Patient was positioned supine position on fluoroscopy table. The right neck was prepped and draped in the usual sterile fashion. 1% lidocaine was used for local anesthesia. Using ultrasound guidance, micropuncture access was performed at the right IJ. Once we confirmed wire position, we measured and internal length to 2 vertebral bodies below the carina. The catheter was modified on the back table and then placed through the peel-away sheath. Final image was stored. Aspiration was confirmed at the catheter site. Catheter was sutured in position and a sterile bandage was placed. Patient tolerated the procedure well and remained hemodynamically stable throughout. No complications were encountered and no significant blood loss. IMPRESSION: Status post image guided placement of right IJ central venous catheter. Signed, Dulcy Fanny. Earleen Newport, DO, RPVI  [facility] Radiology Electronically Signed   By: Corrie Mckusick D.O.   On: 03/18/2019 08:28   Ir US Guide Vasc Access Right  Result Date: 03/18/2019 INDICATION: 68 year old male referred for central line placement EXAM: IMAGE GUIDED PLACEMENT OF CENTRAL VENOUS CATHETER MEDICATIONS: NONE ANESTHESIA/SEDATION: NONE FLUOROSCOPY TIME:  Fluoroscopy Time: 0 minutes 12 seconds (1 mGy). COMPLICATIONS: NONE PROCEDURE: Informed written consent was obtained from the patient after a thorough discussion of the procedural risks, benefits and alternatives. All questions were addressed. Maximal Sterile Barrier Technique was utilized including caps, mask, sterile gowns, sterile gloves, sterile drape, hand hygiene and skin antiseptic. A timeout was performed prior to the initiation of the procedure. Patient was positioned supine position on fluoroscopy table. The right neck was prepped and draped in the usual sterile fashion. 1% lidocaine was used for local anesthesia. Using ultrasound guidance, micropuncture access was performed at the right IJ. Once we confirmed wire position, we measured and internal length to 2 vertebral bodies below the carina. The catheter was modified on the back table and then placed through the peel-away sheath. Final image was stored. Aspiration was confirmed at the catheter site. Catheter was sutured in position and a sterile bandage was placed. Patient tolerated the procedure well and remained hemodynamically stable throughout. No complications were encountered and no significant blood loss. IMPRESSION: Status post image guided placement of right IJ central venous catheter. Signed, Dulcy Fanny. Dellia Nims, RPVI [facility] Radiology Electronically Signed   By: Corrie Mckusick D.O.   On: 03/18/2019 08:28   Dg Abd Portable 1v  Result Date: 03/23/2019 CLINICAL DATA:  NG tube placement; exploratory laparotomy performed 03/21/2019 EXAM: PORTABLE ABDOMEN - 1 VIEW COMPARISON:  CT abdomen pelvis-03/15/2019 FINDINGS:  Enteric tube tip and side port project over the left expected location of mid body of the stomach. There is moderate gaseous distension of several loops of small bowel with index loop of small bowel within the right upper abdominal quadrant measuring approximately 3.9 cm in diameter. This finding is associated with a paucity of distal colonic gas within the imaged upper abdomen. Lucency overlying the midline of the upper abdomen likely represents pneumoperitoneum as a sequela of recent postoperative state. Limited visualization of the lower thorax demonstrates bibasilar heterogeneous opacities IMPRESSION: 1. Enteric tube tip and side port project over the expected location of mid body of the stomach. 2. Gaseous distention of the small bowel with paucity of colonic gas within the imaged upper abdomen, potentially representative of ileus though could be seen in the setting of a developing small-bowel obstruction. Clinical correlation is advised. 3. Suspected pneumoperitoneum within the midline of the upper abdomen, presumably the sequela of patient's recent postoperative state. Electronically Signed   By: Sandi Mariscal M.D.   On: 03/23/2019 09:10    ASSESSMENT AND PLAN: Metastatic poorly differentiated adenocarcinoma of ascending colon -Patient has been having ongoing discussions w/ his family, including his son and two older daughters.  While he and his son are favoring palliative care, his two daughters are expressing the desire to pursue aggressive treatment, including chemotherapy and radiation.  -I discussed with the patient that in light of his declining functional status, severe malnutrition, recent ex lap, and ongoing ileus with possible early SBO, he would not be a candidate for any systemic therapy until he makes some significant clinical improvement (due to the risks of treatment outweighing benefits) -Palliative care is following the patient.  The patient has expressed  that he would like to proceed  with any and all available treatments that he can including chemotherapy and hemodialysis.  He is aware that he is not strong enough right now to receive chemotherapy but is hopeful that this will improve. -CT scan of chest without contrast was performed earlier today.  This showed a new left pleural effusion compared with a prior CT scan of 03/15/2019.  He is currently asymptomatic from this.  We will continue to monitor.  Normocytic anemia  -Likely multifactorial,including occult blood loss from colon malignancy, anemia of chronic disease, Stage IV CKD, and sickle/beta thal disease  -S/p IV iron in 01/2019; on ESA by nephrology -Hemoglobin is currently at 8.3 which is stable. MCV is low normal.  -No symptoms of ongoing GI bleeding -Will repeat IV iron today.  Thrombocytosis -Likely reactive in the setting of malignancy and iron deficiency -Plts 450k today, trending up -Continue daily CBC   Stage V CKD -Has been seen by nephrology who has signed off at this time.  No immediate plans for dialysis. -AVG placed in LUL on 03/20/2019 -No indication for dialysis yet, the patient has expressed that he would like to proceed with any available treatments including hemodialysis if offered.  Goals of care discussion -We have had previous discussions regarding the poor prognosis of the disease; furthermore, given his declining performance status, severe protein malnutrition, ongoing ileus with possible SBO, and multiple comorbidities, the potential risks/side effects of systemic therapy likely outweigh any benefits at this time -As he had reasonable functional status prior to the recent hospitalization, IF he makes significant clinical improvement, we can reconsider systemic therapy in the future  -Overall prognosis is poor   I spoke with the patient's son, [name], by phone today. I explained that Mr. Weichel's nutritional status and functional status remain poor and that he is not a candidate for  chemo at this time. We will have to re-evaluate the patient after discharge to see if this improves. His son expressed understanding.  I also reached out to his daughter, [name], and have explained the same to her.  Both children have expressed some concern about him going home by himself, however, they know that he would not be accepting of going anywhere other than home at this point in time.  We talked about the recommendation for home health to see him after discharge.  The patient's daughter also expressed that if he is not a candidate for chemotherapy in the future, that she would want to focus on optimizing the patient's quality of life.   LOS: 12 days   Mikey Bussing, DNP, AGPCNP-BC, AOCNP 03/27/19

## 2019-03-27 NOTE — Progress Notes (Signed)
PROGRESS NOTE    [name]  JKK:[id number] DOB: July 04, 1950 DOA: 03/15/2019 PCP: Nolene Ebbs, MD   Brief Narrative:  [name] 68 y.o.malewith medical history significant ofsickle cell anemia (SS disease); ESRDnoton HD; HTN; HLD; DM; and colon CA presenting with n/v. He had [name] vocal cord lesion removed with CO2 laser excision during microlaryngoscopy on 10/7. He went home and then started vomiting the evening of 10.7. No vomiting prior to surgery. It would come and go. He has been losing [name] lot of weight and so has been drinking Boost. Unintentional weight loss of about 50 pounds in the last 4-5 months. He started having night sweats recently. No LAD. Slight diarrhea after the surgery, thought it was related to the anesthesia. Normal BMs other than post-operatively. Stools have been black because he has been taking iron. He got 4 units of blood [name] week or two before the surgery, that increased his Hgb from 2.5 up to 8.9. He has not yet started HD, still making urine.  His ex-wife has been caring for him for the last 3 weeks. His colon cancer was 20 years ago. He has never had Enes Rokosz colonoscopy in over 25 years.  He was last admitted 8/13-15 for symptomatic anemia from CKD and given IV iron and Epogen.  Upon arrival to ED, patient had c/o abdominal pain and vomiting. Had laryngeal biopsy [place] few days ago. WBC 40k. CT read as progression of colon CA at cecum and TI with ?typhlitis.  Patient was admitted under hospitalist service with GI and surgery consultation.  Patient underwent colonoscopy on 03/18/2019 which showed cecum and ascending colon mass which was biopsied.  Patient's hemoglobin dropped from 7.026.5 on 03/15/2019 and he received 1 unit of PRBC transfusion but then again it dropped to 6.5 so he received second unit of transfusion on 03/17/2019.  General surgery consulted cardiology and patient was cleared for surgery from cardiac standpoint.  Palliative  medicine was also consulted per general surgery recommendations.  Patient decided to proceed with surgery.  Nephrology consulted vascular surgery for AV graft which was placed on 03/20/2019.  Patient then underwent exploratory laparotomy by general surgery on 03/21/2019 however his mass was unresectable.  Patient then expressed wishes to see oncology for consideration of radiation and chemotherapy.  [name] from oncology saw patient and he recommends against chemotherapy at this point in time due to several comorbidities that patient has.  Palliative care was consulted.  Assessment & Plan:   Principal Problem:   Colon cancer ([place]) Active Problems:   Hypertension   Type 2 diabetes mellitus with renal complication (HCC)   CKD (chronic kidney disease) stage 5, GFR less than 15 ml/min (HCC)   Anemia   Malignant neoplasm of cecum (HCC)   Goals of care, counseling/discussion   Thrombocytosis (HCC)   Colon cancer. Patient with remote h/o colon cancer (records not apparently available) without subsequent follow-up, ex-wife reports no colonoscopy in 25 years -Presented with RLQ abdominal pain, and n/v; also with 50 pounds unintentional weight loss in the last 4-5 months and recent night sweats -Imaging indicates recurrent colon cancer in the cecum/TI. This appears to be at least stage 3 with positive LN, No obstruction on CT. CEA level-elevated.  Patient underwent colonoscopy on 03/18/2019 which showed cecum and ascending colon mass which was biopsied.  Patient has now decided to proceed with surgery.  Cardiology cleared the patient for surgery from cardiac standpoint stating he will be high risk candidate due to other  medical problems.  Patient was going to have palliative right hemicolectomy and underwent exploratory laparotomy by general surgery on 03/21/2019 however his mass was unresectable so he was closed back.  Oncology was consulted.  Seen by [name] who noted pt not candidate for systemic  therapy until significant clinical improvement.  Recommend CT chest without contrast when able to complete staging - will order this for today. Appreciate palliative care assistance.  Will continue to discuss with pt and family as well as palliative care/oncology to help clarify plans for dispo.  Patient remains hopeful that he will be strong enough 1 day to be able to start chemotherapy and HD.  As of now, he is wanting aggressive care but wants to remain DNR.  Stage 5 CKD -Patient has been preparing for HD but has not yet started. Nephrology prn order set utilized.  Seen by nephrology and they consulted vascular surgery for temporary HD and fistula in anticipation for hemodialysis.  Patient underwent left upper arm AV fistula placed on 03/20/2019.  Continues to have fair urine output with Aylene Acoff stable creatinine.  Defer further management to nephrology.    Acute blood loss anemia on chronic anemia secondary to colon cancer/lower GI bleed vs sickle cell anemia: -Likely multifactorial including h/o sickle cell disease and stage 5 CKD but most likely due to his recurrence of colon cancer.  Received 1 unit of PRBC on 03/16/2019 and 1 unit again on 03/17/2019.   - stable and follow  Leukocytosis: afebrile, will continue to monitor  HTN -Blood pressure controlled.  Continue Norvasc, Coreg  Chronic diastolic CHF -00/1749 echo with preserved EF and grade 1 diastolic dysfunction -Despite stage 5 CKD, he does not appear to be volume overloaded at this time. -If he becomes volume overloaded, the likely treatment would be HD.  DM -Hemoglobin A1c 6.9 on 03/15/2019.  HoldOnglyza.  Blood sugar reasonable.  Continue SSI.  Follow.  HLD  -Continue Lipitor  Cocaine abuse -Patient denies use since July, but was positive at the time of last admission in August. -UDS negative this time.  Postoperative ileus: Doing well.  Abdominal pain improving.  BM charted from 10/21.  DVT prophylaxis: SCD Code  Status: DNR Family Communication: none at bedside Disposition Plan: pending further [place] discussions   Consultants:   Palliative  Surgery  GI  Cardiology  Vascular  Oncology  Procedures:   Colonoscopy 10/12  AV graft on 10/15  Antimicrobials:  Anti-infectives (From admission, onward)   Start     Dose/Rate Route Frequency Ordered Stop   03/21/19 1545  cefoTEtan (CEFOTAN) 2 g in sodium chloride 0.9 % 100 mL IVPB     2 g 200 mL/hr over 30 Minutes Intravenous To Surgery 03/21/19 1542 03/21/19 1603   03/21/19 1515  piperacillin-tazobactam (ZOSYN) IVPB 2.25 g     2.25 g 100 mL/hr over 30 Minutes Intravenous To Surgery 03/21/19 1504 03/21/19 1532   03/16/19 0000  piperacillin-tazobactam (ZOSYN) IVPB 2.25 g     2.25 g 100 mL/hr over 30 Minutes Intravenous Every 8 hours 03/15/19 1527 03/25/19 2359   03/15/19 1430  piperacillin-tazobactam (ZOSYN) IVPB 3.375 g     3.375 g 12.5 mL/hr over 240 Minutes Intravenous  Once 03/15/19 1423 03/15/19 1915     Subjective: C/o abdominal pain.    Objective: Vitals:   03/26/19 0840 03/26/19 1601 03/26/19 2035 03/27/19 0623  BP: (!) 147/77 138/74 129/70 140/79  Pulse: 70 65 73 75  Resp:  17 16 17   Temp: 99.1  F (37.3 C) 97.8 F (36.6 C) 98.4 F (36.9 C) 98.2 F (36.8 C)  TempSrc: Oral Oral Oral Oral  SpO2: 99% 94% 96% 95%  Weight:      Height:        Intake/Output Summary (Last 24 hours) at 03/27/2019 0807 Last data filed at 03/27/2019 0300 Gross per 24 hour  Intake 3104.91 ml  Output 1 ml  Net 3103.91 ml   Filed Weights   03/18/19 0800 03/21/19 1327  Weight: 64 kg 64 kg    Examination:  General: No acute distress. Cardiovascular: Heart sounds show Koa Zoeller regular rate, and rhythm.  Lungs: Clear to auscultation bilaterally Abdomen: Soft, TTP, nondistended, midline staples/incision intact Neurological: Alert and oriented 3. Moves all extremities 4. Cranial nerves II through XII grossly intact. Skin: Warm and dry. No  rashes or lesions. Extremities: No clubbing or cyanosis. No edema.   Data Reviewed: I have personally reviewed following labs and imaging studies  CBC: Recent Labs  Lab 03/22/19 0323 03/23/19 0326 03/24/19 0351 03/25/19 0407 03/26/19 0327 03/26/19 2214 03/27/19 0410  WBC 21.2* 33.0* 28.2* 25.9* 27.8*  --  22.3*  NEUTROABS 19.3* 27.1* 21.4* 19.5* 21.3*  --   --   HGB 10.1* 11.0* 9.9* 9.1* 8.2* 8.3* 8.3*  HCT 30.8* 33.3* 29.9* 28.1* 26.0* 25.3* 26.1*  MCV 79.2* 78.9* 80.6 80.5 80.2  --  80.1  PLT 480* 518* 429* 442* 411*  --  226*   Basic Metabolic Panel: Recent Labs  Lab 03/23/19 0326 03/24/19 1229 03/25/19 0407 03/26/19 0327 03/27/19 0410  NA 138 136 139 139 138  K 4.8 4.7 4.5 3.8 3.4*  CL 107 107 109 105 99  CO2 20* 19* 19* 23 27  GLUCOSE 203* 165* 133* 148* 143*  BUN 47* 58* 65* 61* 49*  CREATININE 3.56* 4.23* 4.64* 4.43* 3.51*  CALCIUM 9.4 9.4 9.3 8.7* 8.5*  MG  --   --   --   --  2.1  PHOS 4.3 4.2 4.2 3.3 3.4   GFR: Estimated Creatinine Clearance: 17.7 mL/min (Lanore Renderos) (by C-G formula based on SCr of 3.51 mg/dL (H)). Liver Function Tests: Recent Labs  Lab 03/23/19 0326 03/24/19 1229 03/25/19 0407 03/26/19 0327 03/27/19 0410  ALBUMIN 2.2* 2.2* 2.0* 1.8* 1.9*   No results for input(s): LIPASE, AMYLASE in the last 168 hours. No results for input(s): AMMONIA in the last 168 hours. Coagulation Profile: No results for input(s): INR, PROTIME in the last 168 hours. Cardiac Enzymes: No results for input(s): CKTOTAL, CKMB, CKMBINDEX, TROPONINI in the last 168 hours. BNP (last 3 results) No results for input(s): PROBNP in the last 8760 hours. HbA1C: No results for input(s): HGBA1C in the last 72 hours. CBG: Recent Labs  Lab 03/25/19 2207 03/26/19 0853 03/26/19 1239 03/26/19 1741 03/26/19 2037  GLUCAP 200* 156* 146* 140* 184*   Lipid Profile: No results for input(s): CHOL, HDL, LDLCALC, TRIG, CHOLHDL, LDLDIRECT in the last 72 hours. Thyroid Function  Tests: No results for input(s): TSH, T4TOTAL, FREET4, T3FREE, THYROIDAB in the last 72 hours. Anemia Panel: No results for input(s): VITAMINB12, FOLATE, FERRITIN, TIBC, IRON, RETICCTPCT in the last 72 hours. Sepsis Labs: No results for input(s): PROCALCITON, LATICACIDVEN in the last 168 hours.  Recent Results (from the past 240 hour(s))  Surgical pcr screen     Status: Abnormal   Collection Time: 03/20/19  1:16 AM   Specimen: Nasal Mucosa; Nasal Swab  Result Value Ref Range Status   MRSA, PCR NEGATIVE NEGATIVE Final  Staphylococcus aureus POSITIVE ([name]) NEGATIVE Final    Comment: (NOTE) The Xpert SA Assay (FDA approved for NASAL specimens in patients 44 years of age and older), is one component of Keshanna Riso comprehensive surveillance program. It is not intended to diagnose infection nor to guide or monitor treatment. Performed at [facility], [place] [address], [place], [place] [address]          Radiology Studies: No results found.      Scheduled Meds:  amLODipine  5 mg Oral Daily   atorvastatin  20 mg Oral Daily   carvedilol  25 mg Oral BID WC   Chlorhexidine Gluconate Cloth  6 each Topical Daily   feeding supplement (ENSURE ENLIVE)  237 mL Oral TID BM   folic acid  1 mg Oral Daily   gabapentin  300 mg Oral Daily   insulin aspart  0-9 Units Subcutaneous TID WC   multivitamin with minerals  1 tablet Oral Daily   mupirocin ointment  1 application Topical BID   nystatin  5 mL Oral QID   pantoprazole (PROTONIX) IV  40 mg Intravenous Q24H   sodium chloride flush  10-40 mL Intracatheter Q12H   Continuous Infusions:  sodium chloride 10 mL/hr at 03/21/19 1430   sterile water 1,000 mL with sodium bicarbonate 150 mEq infusion 75 mL/hr at 03/27/19 0300     LOS: 12 days    Time spent: over 30 min    Fayrene Helper, MD [facility] Pager AMION  If 7PM-7AM, please contact night-coverage [email or web address] Password [email or web address] 03/27/2019, 8:07 AM

## 2019-03-27 NOTE — Progress Notes (Signed)
[facility] Progress Note    Assessment/ Plan:   1.  Chronic kidney disease stage IV-V: With multifactorial underlying chronic kidney disease from diabetes, hypertension, history of cocaine use and sickle cell disease.  Creatinine has been at least 3.5 since 2017.   Status post left upper arm arteriovenous graft placement on 03/20/2019 after primary failure of previously placed access.  Some worsening BUN/creatinine noted this hospitalization which likely was hemodynamically mediated with his ileus/restricted oral intake/third spacing-now with some oral intake which we are supplementing with intravenous fluids.  Thankfully looks a little better with inc UOP and better numbers   -  No acute indications for dialysis at this time  And fortunately renal function continues to improve c/w hemodynamic mediated process.   - Will sign off at this time; please reconsult as needed.  2.  Recurrent colon cancer: With new cecal mass and biopsy showing metastatic poorly differentiated adenocarcinoma of the ascending colon with metastases that is inoperable.  Plans noted by Dr. [name] of oncology to offer palliative chemotherapy after additional imaging for staging and allowing improvement of patient clinical status/recovery from recent ex lap.    Upon review, palliative care and oncology notes state he is not strong enough for chemo at this time.  3.  Microcytic anemia: Multifactorial from chronic kidney disease/sickle cell and colon cancer.  Continue surveillance of iron stores for intravenous supplementation.  No ESA due to active cancer 4.  Postoperative ileus: Without bowel movement and with some improvement of abdominal pain-on clear liquids.  Leukocytosis continues to improve 5.  Hypertension: cont norvasc and coreg right now - no change- OK to cont IVF as is not volume overloaded  Subjective:   Reports some  abdominal discomfort but has flatus; discomfort mainly with movement.  He has  been ambulating in the room and using the restroom.    Objective:   BP (!) 156/81   Pulse 79   Temp 98.2 F (36.8 C) (Oral)   Resp 17   Ht 6\' 1"  (1.854 m)   Wt 64 kg   SpO2 95%   BMI 18.62 kg/m   Intake/Output Summary (Last 24 hours) at 03/27/2019 1016 Last data filed at 03/27/2019 0300 Gross per 24 hour  Intake 2624.91 ml  Output -  Net 2624.91 ml   Weight change:   Physical Exam: Gen: Appears comfortable  CVS: Pulse regular rhythm, normal rate, S1 and S2 normal Resp: Clear to auscultation, no rales/rhonchi.  Right IJ PICC line. Abd: Mildly distended, guarding and tenderness present; staples in place. Ext: No lower extremity edema.  Left upper arm AVG with palpable thrill and strong bruit.  Imaging: Ct Chest Wo Contrast  Result Date: 03/27/2019 CLINICAL DATA:  Pt c/o upper midline chest pain; pt denies cough, or SOB. Sickle cell disease. EXAM: CT CHEST WITHOUT CONTRAST TECHNIQUE: Multidetector CT imaging of the chest was performed following the standard protocol without IV contrast. COMPARISON:  CT abdomen 03/15/2019 FINDINGS: Cardiovascular: RIGHT central venous line. No acute cardiac findings. No pericardial fluid. Mediastinum/Nodes: No axillary or supraclavicular adenopathy. No mediastinal hilar adenopathy. No pericardial effusion. Esophagus normal. Lungs/Pleura: New LEFT pleural effusion and basilar atelectasis compared to CT 03/15/2019. Mild RIGHT basilar atelectasis also increased. No focal infiltrate. No pneumothorax. 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 Musculoskeletal: Diffuse sclerosis throughout the entirety of the skeleton consistent sickle cell change. IMPRESSION: 1. New LEFT pleural effusion and basilar atelectasis. No clear evidence pneumonia. 2. Mild  atelectasis at the RIGHT lung base is also new. 3. Sequelae of sickle cell disease with infarcted spleen and dense sclerosis of the bones. Electronically Signed    By: Suzy Bouchard M.D.   On: 03/27/2019 09:44    Labs: BMET Recent Labs  Lab 03/21/19 0402 03/23/19 0326 03/24/19 1229 03/25/19 0407 03/26/19 0327 03/27/19 0410  NA 139 138 136 139 139 138  K 4.2 4.8 4.7 4.5 3.8 3.4*  CL 109 107 107 109 105 99  CO2 20* 20* 19* 19* 23 27  GLUCOSE 135* 203* 165* 133* 148* 143*  BUN 37* 47* 58* 65* 61* 49*  CREATININE 3.20* 3.56* 4.23* 4.64* 4.43* 3.51*  CALCIUM 8.8* 9.4 9.4 9.3 8.7* 8.5*  PHOS  --  4.3 4.2 4.2 3.3 3.4   CBC Recent Labs  Lab 03/23/19 0326 03/24/19 0351 03/25/19 0407 03/26/19 0327 03/26/19 2214 03/27/19 0410  WBC 33.0* 28.2* 25.9* 27.8*  --  22.3*  NEUTROABS 27.1* 21.4* 19.5* 21.3*  --   --   HGB 11.0* 9.9* 9.1* 8.2* 8.3* 8.3*  HCT 33.3* 29.9* 28.1* 26.0* 25.3* 26.1*  MCV 78.9* 80.6 80.5 80.2  --  80.1  PLT 518* 429* 442* 411*  --  450*    Medications:    . amLODipine  5 mg Oral Daily  . atorvastatin  20 mg Oral Daily  . carvedilol  25 mg Oral BID WC  . Chlorhexidine Gluconate Cloth  6 each Topical Daily  . feeding supplement (ENSURE ENLIVE)  237 mL Oral TID BM  . folic acid  1 mg Oral Daily  . gabapentin  300 mg Oral Daily  . insulin aspart  0-9 Units Subcutaneous TID WC  . multivitamin with minerals  1 tablet Oral Daily  . mupirocin ointment  1 application Topical BID  . nystatin  5 mL Oral QID  . pantoprazole (PROTONIX) IV  40 mg Intravenous Q24H  . sodium chloride flush  10-40 mL Intracatheter Q12H      Otelia Santee, MD 03/27/2019, 10:16 AM

## 2019-03-27 NOTE — Progress Notes (Signed)
Nutrition Follow-up  DOCUMENTATION CODES:   Non-severe (moderate) malnutrition in context of chronic illness  INTERVENTION:   -Continue MVI with minerals daily -Boost Breeze po TID, each supplement provides 250 kcal and 9 grams of protein -D/c Ensure Enlive -Continue Magic cup TID with meals, each supplement provides 290 kcal and 9 grams of protein  NUTRITION DIAGNOSIS:   Moderate Malnutrition related to chronic illness(colon cancer) as evidenced by mild fat depletion, moderate fat depletion, mild muscle depletion, moderate muscle depletion.  Ongoing  GOAL:   Patient will meet greater than or equal to 90% of their needs  Progressing   MONITOR:   Supplement acceptance, PO intake, Labs, Weight trends, Skin, I & O's  REASON FOR ASSESSMENT:   Malnutrition Screening Tool    ASSESSMENT:   [name] is a 68 y.o. male with medical history significant of sickle cell anemia (SS disease); ESRD not on HD; HTN; HLD; DM; and colon CA presenting with n/v.  He had a vocal cord lesion removed with CO2 laser excision during microlaryngoscopy on 10/7.  He went home and then started vomiting the evening of 10.7.  No vomiting prior to surgery.  It would come and go.  He has been losing a lot of weight and so has been drinking Boost.  Unintentional weight loss of about 50 pounds in the last 4-5 months.  He started having night sweats recently.  No LAD.  Slight diarrhea after the surgery, thought it was related to the anesthesia.  Normal BMs other than post-operatively.  Stools have been black because he has been taking iron.  He got 4 units of blood a week or two before the surgery, that increased his Hgb from 2.5 up to 8.9.  He has not yet started HD, still making urine.  10/13- s/p colonoscopy 10/14- pathology on colon mass reveals poorly differentiated carcinoma 10/15- s/p PROCEDURE:Left upper arm AV Gore-Tex graft 10/16- s/p ex lap, colon mass unresectable 10/18- NGT inserted due to  emesis, removed   Reviewed I/O's: +3.1 L x 24 hours and +7 L since admission  Pt resting quietly at time of visit. He was pleasant and in good spirits. He reports tolerating some solid food. Meal completion 0-50%. Noted pt with Magic Cup and Ensure supplement at bedside. Pt reports he has not tried the [employer], however, Ensure "makes me run to the bathroom".   Discussed importance of good meal and supplement intake to promote healing. Pt reports that milky type supplements (Ensure and Boost) have made him have diarrhea, even PTA. He is amenable to try [facility], which he tolerated well earlier this admission.   Labs reviewed: K: 3.4, CBGS: 184 (inpatient orders for glycemic control are 0-9 units insulin aspart TID with meals).   NUTRITION - FOCUSED PHYSICAL EXAM:    Most Recent Value  Orbital Region  Moderate depletion  Upper Arm Region  Mild depletion  Thoracic and Lumbar Region  Mild depletion  Buccal Region  Mild depletion  Temple Region  Moderate depletion  Clavicle Bone Region  Mild depletion  Clavicle and Acromion Bone Region  Mild depletion  Scapular Bone Region  Mild depletion  Dorsal Hand  Mild depletion  Patellar Region  Moderate depletion  Anterior Thigh Region  Moderate depletion  Posterior Calf Region  Moderate depletion  Edema (RD Assessment)  Mild  Hair  Reviewed  Eyes  Reviewed  Mouth  Reviewed  Skin  Reviewed  Nails  Reviewed       Diet  Order:   Diet Order            DIET SOFT Room service appropriate? Yes; Fluid consistency: Thin  Diet effective now              EDUCATION NEEDS:   Education needs have been addressed  Skin:  Skin Assessment: Skin Integrity Issues: Skin Integrity Issues:: Incisions Incisions: close lt arm, abdomen  Last BM:  03/27/19  Height:   Ht Readings from Last 1 Encounters:  03/21/19 6\' 1"  (1.854 m)    Weight:   Wt Readings from Last 1 Encounters:  03/21/19 64 kg    Ideal Body Weight:  83.6 kg  BMI:  Body  mass index is 18.62 kg/m.  Estimated Nutritional Needs:   Kcal:  2050-2250  Protein:  110-125 grams  Fluid:  2-2.2 L    Altha Sweitzer A. Jimmye Norman, RD, LDN, [place] Registered Dietitian II Certified Diabetes Care and Education Specialist Pager: [phone] After hours Pager: [phone]

## 2019-03-28 DIAGNOSIS — C182 Malignant neoplasm of ascending colon: Secondary | ICD-10-CM | POA: Diagnosis not present

## 2019-03-28 LAB — RENAL FUNCTION PANEL
Albumin: 1.8 g/dL — ABNORMAL LOW (ref 3.5–5.0)
Anion gap: 11 (ref 5–15)
BUN: 39 mg/dL — ABNORMAL HIGH (ref 8–23)
CO2: 32 mmol/L (ref 22–32)
Calcium: 8.5 mg/dL — ABNORMAL LOW (ref 8.9–10.3)
Chloride: 97 mmol/L — ABNORMAL LOW (ref 98–111)
Creatinine, Ser: 2.93 mg/dL — ABNORMAL HIGH (ref 0.61–1.24)
GFR calc Af Amer: 24 mL/min — ABNORMAL LOW (ref >60)
GFR calc non Af Amer: 21 mL/min — ABNORMAL LOW (ref >60)
Glucose, Bld: 160 mg/dL — ABNORMAL HIGH (ref 70–99)
Phosphorus: 3.3 mg/dL (ref 2.5–4.6)
Potassium: 3.5 mmol/L (ref 3.5–5.1)
Sodium: 140 mmol/L (ref 135–145)

## 2019-03-28 LAB — CBC
HCT: 25.2 % — ABNORMAL LOW (ref 39.0–52.0)
Hemoglobin: 8.1 g/dL — ABNORMAL LOW (ref 13.0–17.0)
MCH: 25.6 pg — ABNORMAL LOW (ref 26.0–34.0)
MCHC: 32.1 g/dL (ref 30.0–36.0)
MCV: 79.7 fL — ABNORMAL LOW (ref 80.0–100.0)
Platelets: 445 10*3/uL — ABNORMAL HIGH (ref 150–400)
RBC: 3.16 MIL/uL — ABNORMAL LOW (ref 4.22–5.81)
RDW: 22.9 % — ABNORMAL HIGH (ref 11.5–15.5)
WBC: 22.6 10*3/uL — ABNORMAL HIGH (ref 4.0–10.5)
nRBC: 0.2 % (ref 0.0–0.2)

## 2019-03-28 LAB — MAGNESIUM: Magnesium: 2.1 mg/dL (ref 1.7–2.4)

## 2019-03-28 LAB — GLUCOSE, CAPILLARY
Glucose-Capillary: 220 mg/dL — ABNORMAL HIGH (ref 70–99)
Glucose-Capillary: 93 mg/dL (ref 70–99)
Glucose-Capillary: 178 mg/dL — ABNORMAL HIGH (ref 70–99)
Glucose-Capillary: 124 mg/dL — ABNORMAL HIGH (ref 70–99)

## 2019-03-28 MED ORDER — OXYCODONE HCL 5 MG PO TABS
5.0000 mg | ORAL_TABLET | ORAL | Status: DC | PRN
  Administered 2019-03-29 – 2019-03-31 (×3): 5 mg via ORAL
  Filled 2019-03-28 (×5): qty 1

## 2019-03-28 MED ORDER — OXYCODONE HCL 5 MG PO TABS
10.0000 mg | ORAL_TABLET | ORAL | Status: DC | PRN
  Administered 2019-03-28 – 2019-04-03 (×17): 10 mg via ORAL
  Filled 2019-03-28 (×16): qty 2

## 2019-03-28 NOTE — Progress Notes (Signed)
Spoke with RN regarding PIV consult.  IV team has previously attempted multiple times including attempts with ultrasound.  IV team is not able to obtain suitable PIV for this patient.

## 2019-03-28 NOTE — TOC Progression Note (Signed)
Transition of Care [facility]) - Progression Note    Patient Details  Name: [name] MRN: [id number] Date of Birth: [birth year]  [facility] CM/SW Contact  Jacalyn Lefevre Edson Snowball, RN Phone Number: 03/28/2019, 10:39 AM  Clinical Narrative:     Revisited patient again. At this time he does not want HHPT.   Patient currently on oxygen, he does not have oxygen at this time.   Will continue to follow.  Expected Discharge Plan: Home/Self Care Barriers to Discharge: Continued Medical Work up  Expected Discharge Plan and Services Expected Discharge Plan: Home/Self Care In-house Referral: Clinical Social Work Discharge Planning Services: CM Consult Post Acute Care Choice: [place] arrangements for the past 2 months: Single Family Home                 DME Arranged: N/A         HH Arranged: Patient Refused HH           Social Determinants of Health (SDOH) Interventions    Readmission Risk Interventions Readmission Risk Prevention Plan 03/19/2019 01/18/2019  Transportation Screening Complete Complete  PCP or Specialist Appt within 3-5 Days - Complete  HRI or [place] - Complete  Social Work Consult for [place] Planning/Counseling - Complete  Palliative Care Screening - Not Applicable  Medication Review Press photographer) Complete Complete  PCP or Specialist appointment within 3-5 days of discharge Complete -  [place] or Home Care Consult Complete -  SW Recovery Care/Counseling Consult Complete -  Palliative Care Screening Not Applicable -  Skilled Nursing Facility Complete -  Some recent data might be hidden

## 2019-03-28 NOTE — Progress Notes (Signed)
Writer paged by Cephus Slater., MD. He requested a peripheral IV be attempted as pt is close to discharging, but is receiving morphine; he would like RIJ CL to be discontinued.  Writer assessed pt's right arm and was able to place a 22g PIV as charted.

## 2019-03-28 NOTE — Final Consult Note (Signed)
Consultant Final Sign-Off Note    Assessment/Final recommendations  [name] is a 68 y.o. male followed by me for:  Cecal Mass - poorly differentiated carcinoma -colonoscopy10/13showedMalignant tumor in the ascending colon and in the cecum.path showedPoorly differentiated carcinoma - s/pexploratory laparotomy,Dr. Dema Severin, 10/16 -Intraoperative findings were that tumor was unresectable -oncology and palliative carefollowing Post op ileus - appears to be resolved. Tolerating diet and having BM's   Wound care (if applicable): staples to be removed on 10/30 if still in hospital or at our office, appointment in AVS   Diet at discharge: regular diet is okay from our standpoint   Activity at discharge: per primary team   Follow-up appointment:  In AVS   Pending results:  Unresulted Labs (From admission, onward)    Start     Ordered   03/25/19 0500  Renal function panel  Daily,   R    Question:  Specimen collection method  Answer:  IV Team=IV Team collect   03/24/19 0935           Medication recommendations: bowel regiment to keep stools soft   Other recommendations:    Thank you for allowing [place] to participate in the care of your patient!  Please consult [place] again if you have further needs for your patient.  Kalman Drape 03/28/2019 9:23 AM    Subjective   CC: abdominal pain  Pt states he is tolerating a diet and had a BM this am. No N or V. Still haivng significant abdominal pain.   Objective  Vital signs in last 24 hours: Temp:  [98.5 F (36.9 C)-98.7 F (37.1 C)] 98.7 F (37.1 C) (10/23 0547) Pulse Rate:  [73-79] 73 (10/23 0547) Resp:  [15-17] 17 (10/23 0547) BP: (143-146)/(71-81) 146/71 (10/23 0547) SpO2:  [91 %-96 %] 91 % (10/23 0547)  PE: Gen: Alert, NAD, pleasant, cooperative, sitting on side of bed just had a BM Pulm:Rate andeffort normal Abd: Soft,ND,midline with staples intact appear well and are without signs of  infection Skin: no rashes noted, warm and dry   Pertinent labs and Studies: Recent Labs    03/26/19 0327 03/26/19 2214 03/27/19 0410 03/28/19 0501  WBC 27.8*  --  22.3* 22.6*  HGB 8.2* 8.3* 8.3* 8.1*  HCT 26.0* 25.3* 26.1* 25.2*   BMET Recent Labs    03/27/19 0410 03/28/19 0501  NA 138 140  K 3.4* 3.5  CL 99 97*  CO2 27 32  GLUCOSE 143* 160*  BUN 49* 39*  CREATININE 3.51* 2.93*  CALCIUM 8.5* 8.5*   No results for input(s): LABURIN in the last 72 hours. Results for orders placed or performed during the hospital encounter of 03/15/19  SARS CORONAVIRUS 2 (TAT 6-24 HRS) Nasopharyngeal Nasopharyngeal Swab     Status: None   Collection Time: 03/15/19  4:28 PM   Specimen: Nasopharyngeal Swab  Result Value Ref Range Status   SARS Coronavirus 2 NEGATIVE NEGATIVE Final    Comment: (NOTE) SARS-CoV-2 target nucleic acids are NOT DETECTED. The SARS-CoV-2 RNA is generally detectable in upper and lower respiratory specimens during the acute phase of infection. Negative results do not preclude SARS-CoV-2 infection, do not rule out co-infections with other pathogens, and should not be used as the sole basis for treatment or other patient management decisions. Negative results must be combined with clinical observations, patient history, and epidemiological information. The expected result is Negative. Fact Sheet for Patients: SugarRoll.be Fact Sheet for Healthcare Providers: https://www.woods-mathews.com/ This test is not yet approved  or cleared by the [place] and  has been authorized for detection and/or diagnosis of SARS-CoV-2 by FDA under an Emergency Use Authorization (EUA). This EUA will remain  in effect (meaning this test can be used) for the duration of the COVID-19 declaration under Section 56 4(b)(1) of the Act, 21 U.S.C. section 360bbb-3(b)(1), unless the authorization is terminated or revoked sooner. Performed at  [facility], [place] [address], [place], [place] [address]   Surgical pcr screen     Status: Abnormal   Collection Time: 03/20/19  1:16 AM   Specimen: Nasal Mucosa; Nasal Swab  Result Value Ref Range Status   MRSA, PCR NEGATIVE NEGATIVE Final   Staphylococcus aureus POSITIVE (A) NEGATIVE Final    Comment: (NOTE) The Xpert SA Assay (FDA approved for NASAL specimens in patients 27 years of age and older), is one component of a comprehensive surveillance program. It is not intended to diagnose infection nor to guide or monitor treatment. Performed at [facility], [place] [address] [place],  [address]     Imaging: No results found.

## 2019-03-28 NOTE — Progress Notes (Signed)
Physical Therapy Treatment Patient Details Name: [name] MRN: [id number] DOB: 04-20-1951 Today's Date: 03/28/2019    History of Present Illness Pt is a 68 y/o male admitted secondary to n/v and RLQ abdominal pain. Imaging indicates recurrent colon cancer in the cecum/TI. Colonoscopy on 10/13 showed malignant tumor in the ascending colon and in the cecum. path showed poorly differentiated carcinoma. Pt underwent exploratory laparotomy surgery on 03/21/19; however, intraoperative findings were that tumor was unresectable. Consults to oncology and palliative were recommended. PMH including but not limited to colon cancer s/p some type of colon resection in 1982, CKD stage 5, DM, HTN, CHF and cocaine abuse.    PT Comments    Patient received in bed, asleep but easily woken and willing to participate in therapy today. Able to complete bed mobility and functional transfers with S and RW, gait approximately 50f with RW and S as well but with shuffling gait pattern. Easily fatigued and mildly SOB at end of gait distance. He was left up in the chair with all needs met, chair alarm active this afternoon.    Follow Up Recommendations  Home health PT     Equipment Recommendations  3in1 (PT)    Recommendations for Other Services       Precautions / Restrictions Precautions Precautions: Fall Precaution Comments: abdominal pain Restrictions Weight Bearing Restrictions: No    Mobility  Bed Mobility Overal bed mobility: Needs Assistance Bed Mobility: Supine to Sit   Sidelying to sit: Supervision       General bed mobility comments: S for safety, no physical assist given  Transfers Overall transfer level: Needs assistance Equipment used: Rolling walker (2 wheeled) Transfers: Sit to/from Stand Sit to Stand: Supervision         General transfer comment: S for safety, no physical assist given  Ambulation/Gait Ambulation/Gait assistance: Supervision Gait Distance (Feet): 70  Feet   Gait Pattern/deviations: Step-through pattern;Trunk flexed;Decreased step length - right;Decreased step length - left;Shuffle Gait velocity: decreased   General Gait Details: slow and steady with RW but with short step lengths bordering on shuffle, easily fatigued   Stairs             Wheelchair Mobility    Modified Rankin (Stroke Patients Only)       Balance Overall balance assessment: Needs assistance Sitting-balance support: Feet supported Sitting balance-Leahy Scale: Good Sitting balance - Comments: able to reach down to touch feet from EOB   Standing balance support: Bilateral upper extremity supported;During functional activity Standing balance-Leahy Scale: Fair Standing balance comment: heavy reliance on B UE support                            Cognition Arousal/Alertness: Awake/alert Behavior During Therapy: WFL for tasks assessed/performed Overall Cognitive Status: No family/caregiver present to determine baseline cognitive functioning Area of Impairment: Memory;Following commands;Problem solving                     Memory: Decreased short-term memory Following Commands: Follows one step commands with increased time;Follows one step commands consistently Safety/Judgement: Decreased awareness of deficits;Decreased awareness of safety   Problem Solving: Requires verbal cues General Comments: cognition improving today, much more alert but continues to require cues for safety; very cooperative and receptive to education      Exercises      General Comments        Pertinent Vitals/Pain Pain Assessment: Faces Pain Score: 0-No pain Pain  Intervention(s): Limited activity within patient's tolerance;Monitored during session    Home Living                      Prior Function            PT Goals (current goals can now be found in the care plan section) Acute Rehab PT Goals Patient Stated Goal: to find out what else  can be done to treat his cancer PT Goal Formulation: With patient Time For Goal Achievement: 04/05/19 Potential to Achieve Goals: Fair Progress towards PT goals: Progressing toward goals    Frequency    Min 3X/week      PT Plan Current plan remains appropriate    Co-evaluation              AM-PAC PT "6 Clicks" Mobility   Outcome Measure  Help needed turning from your back to your side while in a flat bed without using bedrails?: None Help needed moving from lying on your back to sitting on the side of a flat bed without using bedrails?: None Help needed moving to and from a bed to a chair (including a wheelchair)?: A Little Help needed standing up from a chair using your arms (e.g., wheelchair or bedside chair)?: A Little Help needed to walk in hospital room?: A Little Help needed climbing 3-5 steps with a railing? : A Little 6 Click Score: 20    End of Session   Activity Tolerance: Patient tolerated treatment well Patient left: in chair;with chair alarm set;with call bell/phone within reach   PT Visit Diagnosis: Muscle weakness (generalized) (M62.81);Pain;Other abnormalities of gait and mobility (R26.89) Pain - Right/Left: (abdomen) Pain - part of body: (abdomen)     Time: 1425-1440 PT Time Calculation (min) (ACUTE ONLY): 15 min  Charges:  $Gait Training: 8-22 mins                     Deniece Ree PT, DPT, CBIS  Supplemental Physical Therapist [place]    Pager [phone] Acute Rehab Office [phone]

## 2019-03-28 NOTE — Progress Notes (Addendum)
PROGRESS NOTE    [name]  KZL:[id number] DOB: 04-17-1951 DOA: 03/15/2019 PCP: Justin Ebbs, MD   Brief Narrative:  [name] 68 y.o.malewith medical history significant ofsickle cell anemia (SS disease); ESRDnoton HD; HTN; HLD; DM; and colon CA presenting with n/v. He had [name] vocal cord lesion removed with CO2 laser excision during microlaryngoscopy on 10/7. He went home and then started vomiting the evening of 10.7. No vomiting prior to surgery. It would come and go. He has been losing [name] lot of weight and so has been drinking Boost. Unintentional weight loss of about 50 pounds in the last 4-5 months. He started having night sweats recently. No LAD. Slight diarrhea after the surgery, thought it was related to the anesthesia. Normal BMs other than post-operatively. Stools have been black because he has been taking iron. He got 4 units of blood [name] week or two before the surgery, that increased his Hgb from 2.5 up to 8.9. He has not yet started HD, still making urine.  His ex-wife has been caring for him for the last 3 weeks. His colon cancer was 20 years ago. He has never had [name] colonoscopy in over 25 years.  He was last admitted 8/13-15 for symptomatic anemia from CKD and given IV iron and Epogen.  Upon arrival to ED, patient had c/o abdominal pain and vomiting. Had laryngeal biopsy [name] few days ago. WBC 40k. CT read as progression of colon CA at cecum and TI with ?typhlitis.  Patient was admitted under hospitalist service with GI and surgery consultation.  Patient underwent colonoscopy on 03/18/2019 which showed cecum and ascending colon mass which was biopsied.  Patient's hemoglobin dropped from 7.026.5 on 03/15/2019 and he received 1 unit of PRBC transfusion but then again it dropped to 6.5 so he received second unit of transfusion on 03/17/2019.  General surgery consulted cardiology and patient was cleared for surgery from cardiac standpoint.  Palliative  medicine was also consulted per general surgery recommendations.  Patient decided to proceed with surgery.  Nephrology consulted vascular surgery for AV graft which was placed on 03/20/2019.  Patient then underwent exploratory laparotomy by general surgery on 03/21/2019 however his mass was unresectable.  Patient then expressed wishes to see oncology for consideration of radiation and chemotherapy.  Dr. Maylon Todd from oncology saw patient and he recommends against chemotherapy at this point in time due to several comorbidities that patient has.  Palliative care was consulted.  Assessment & Plan:   Principal Problem:   Colon cancer ([name]) Active Problems:   Hypertension   Type 2 diabetes mellitus with renal complication (HCC)   CKD (chronic kidney disease) stage 5, GFR less than 15 ml/min (HCC)   Anemia   Malignant neoplasm of cecum (HCC)   Goals of care, counseling/discussion   Thrombocytosis (HCC)   Colon cancer. Patient with remote h/o colon cancer (records not apparently available) without subsequent follow-up, ex-wife reports no colonoscopy in 25 years -Presented with RLQ abdominal pain, and n/v; also with 50 pounds unintentional weight loss in the last 4-5 months and recent night sweats -Imaging indicates recurrent colon cancer in the cecum/TI. This appears to be at least stage 3 with positive LN, No obstruction on CT. CEA level-elevated.  Patient underwent colonoscopy on 03/18/2019 which showed cecum and ascending colon mass which was biopsied.  Patient has now decided to proceed with surgery.  Cardiology cleared the patient for surgery from cardiac standpoint stating he will be high risk candidate due to other  medical problems.  Patient was going to have palliative right hemicolectomy and underwent exploratory laparotomy by general surgery on 03/21/2019 however his mass was unresectable so he was closed back.  Oncology was consulted.  Seen by Dr. Maylon Todd who noted pt not candidate for systemic  therapy until significant clinical improvement.  Recommend CT chest without contrast when able to complete staging - with new L pleural effusion, atelectasis at R lung base.  Sequelae of sickle cell disease. Appreciate palliative care assistance.  Will continue to discuss with pt and family as well as palliative care/oncology to help clarify plans for dispo (difficult at this time, pt lives alone, therapy recommending 24 hr assistance, he would be unable to go home alone at this time - discussed with son who note family is discussing plan amongst themselves, possible that he may go home with ex wife, but she has significant health issues as well).  Transition to oral pain medications.  Patient remains hopeful that he will be strong enough 1 day to be able to start chemotherapy and HD.  As of now, he is wanting aggressive care but wants to remain DNR.  Left Pleural Effusion: ?malignant with above.  He's doing well from resp standpoint at this time.  Afebrile.  White count is stable.   Will continue to monitor for now, consider thoracentesis if worsening  Stage 5 CKD -Patient has been preparing for HD but has not yet started. Nephrology prn order set utilized.  Seen by nephrology and they consulted vascular surgery for temporary HD and fistula in anticipation for hemodialysis.  Patient underwent left upper arm AV fistula placed on 03/20/2019.  Creatinine improving.  Nephrology now signed off.    Acute blood loss anemia on chronic anemia secondary to colon cancer/lower GI bleed vs sickle cell anemia: -Likely multifactorial including h/o sickle cell disease and stage 5 CKD but most likely due to his recurrence of colon cancer.  Received 1 unit of PRBC on 03/16/2019 and 1 unit again on 03/17/2019.   - stable and follow  Leukocytosis: afebrile, will continue to monitor  HTN -Blood pressure controlled.  Continue Norvasc, Coreg  Chronic diastolic CHF -63/8756 echo with preserved EF and grade 1  diastolic dysfunction -Despite stage 5 CKD, he does not appear to be volume overloaded at this time. -If he becomes volume overloaded, the likely treatment would be HD.  DM -Hemoglobin A1c 6.9 on 03/15/2019.  HoldOnglyza.  Blood sugar reasonable.  Continue SSI.  Follow.  HLD  -Continue Lipitor  Cocaine abuse -Patient denies use since July, but was positive at the time of last admission in August. -UDS negative this time.  Postoperative ileus: Doing well.  Abdominal pain improving.  BM charted from 10/22.  RIJ central line - asked nursing to try to get alternative access, then we'll remove this  DVT prophylaxis: SCD Code Status: DNR Family Communication: none at bedside - discussed with son over phone Disposition Plan: pending further [place] discussions   Consultants:   Palliative  Surgery  GI  Cardiology  Vascular  Oncology  Procedures:   Colonoscopy 10/12  AV graft on 10/15  Antimicrobials:  Anti-infectives (From admission, onward)   Start     Dose/Rate Route Frequency Ordered Stop   03/21/19 1545  cefoTEtan (CEFOTAN) 2 g in sodium chloride 0.9 % 100 mL IVPB     2 g 200 mL/hr over 30 Minutes Intravenous To Surgery 03/21/19 1542 03/21/19 1603   03/21/19 1515  piperacillin-tazobactam (ZOSYN) IVPB 2.25 g  2.25 g 100 mL/hr over 30 Minutes Intravenous To Surgery 03/21/19 1504 03/21/19 1532   03/16/19 0000  piperacillin-tazobactam (ZOSYN) IVPB 2.25 g     2.25 g 100 mL/hr over 30 Minutes Intravenous Every 8 hours 03/15/19 1527 03/25/19 2359   03/15/19 1430  piperacillin-tazobactam (ZOSYN) IVPB 3.375 g     3.375 g 12.5 mL/hr over 240 Minutes Intravenous  Once 03/15/19 1423 03/15/19 1915     Subjective: Continued abdominal pain Not feeling great  Objective: Vitals:   03/27/19 0823 03/27/19 1354 03/27/19 2007 03/28/19 0547  BP: (!) 156/81 (!) 143/78 (!) 143/81 (!) 146/71  Pulse: 79 79 78 73  Resp:  15 17 17   Temp:  98.6 F (37 C) 98.5 F (36.9 C)  98.7 F (37.1 C)  TempSrc:  Oral Oral Oral  SpO2:  96% 96% 91%  Weight:      Height:        Intake/Output Summary (Last 24 hours) at 03/28/2019 0836 Last data filed at 03/28/2019 0600 Gross per 24 hour  Intake 120 ml  Output 320 ml  Net -200 ml   Filed Weights   03/18/19 0800 03/21/19 1327  Weight: 64 kg 64 kg    Examination:  General: No acute distress. Cardiovascular: Heart sounds show Leilany Digeronimo regular rate, and rhythm. Lungs: Clear to auscultation bilaterally Abdomen: Soft, mildly tender.  Staples in place.  Neurological: Alert and oriented 3. Moves all extremities 4. Cranial nerves II through XII grossly intact. Skin: Warm and dry. No rashes or lesions. Extremities: No clubbing or cyanosis. No edema.    Data Reviewed: I have personally reviewed following labs and imaging studies  CBC: Recent Labs  Lab 03/22/19 0323 03/23/19 0326 03/24/19 0351 03/25/19 0407 03/26/19 0327 03/26/19 2214 03/27/19 0410 03/28/19 0501  WBC 21.2* 33.0* 28.2* 25.9* 27.8*  --  22.3* 22.6*  NEUTROABS 19.3* 27.1* 21.4* 19.5* 21.3*  --   --   --   HGB 10.1* 11.0* 9.9* 9.1* 8.2* 8.3* 8.3* 8.1*  HCT 30.8* 33.3* 29.9* 28.1* 26.0* 25.3* 26.1* 25.2*  MCV 79.2* 78.9* 80.6 80.5 80.2  --  80.1 79.7*  PLT 480* 518* 429* 442* 411*  --  450* 350*   Basic Metabolic Panel: Recent Labs  Lab 03/24/19 1229 03/25/19 0407 03/26/19 0327 03/27/19 0410 03/28/19 0501  NA 136 139 139 138 140  K 4.7 4.5 3.8 3.4* 3.5  CL 107 109 105 99 97*  CO2 19* 19* 23 27 32  GLUCOSE 165* 133* 148* 143* 160*  BUN 58* 65* 61* 49* 39*  CREATININE 4.23* 4.64* 4.43* 3.51* 2.93*  CALCIUM 9.4 9.3 8.7* 8.5* 8.5*  MG  --   --   --  2.1 2.1  PHOS 4.2 4.2 3.3 3.4 3.3   GFR: Estimated Creatinine Clearance: 21.2 mL/min (Wylene Weissman) (by C-G formula based on SCr of 2.93 mg/dL (H)). Liver Function Tests: Recent Labs  Lab 03/24/19 1229 03/25/19 0407 03/26/19 0327 03/27/19 0410 03/28/19 0501  ALBUMIN 2.2* 2.0* 1.8* 1.9* 1.8*   No  results for input(s): LIPASE, AMYLASE in the last 168 hours. No results for input(s): AMMONIA in the last 168 hours. Coagulation Profile: No results for input(s): INR, PROTIME in the last 168 hours. Cardiac Enzymes: No results for input(s): CKTOTAL, CKMB, CKMBINDEX, TROPONINI in the last 168 hours. BNP (last 3 results) No results for input(s): PROBNP in the last 8760 hours. HbA1C: No results for input(s): HGBA1C in the last 72 hours. CBG: Recent Labs  Lab 03/27/19 [phone] 03/27/19  1230 03/27/19 1815 03/27/19 2238 03/28/19 0757  GLUCAP 184* 148* 127* 184* 220*   Lipid Profile: No results for input(s): CHOL, HDL, LDLCALC, TRIG, CHOLHDL, LDLDIRECT in the last 72 hours. Thyroid Function Tests: No results for input(s): TSH, T4TOTAL, FREET4, T3FREE, THYROIDAB in the last 72 hours. Anemia Panel: No results for input(s): VITAMINB12, FOLATE, FERRITIN, TIBC, IRON, RETICCTPCT in the last 72 hours. Sepsis Labs: No results for input(s): PROCALCITON, LATICACIDVEN in the last 168 hours.  Recent Results (from the past 240 hour(s))  Surgical pcr screen     Status: Abnormal   Collection Time: 03/20/19  1:16 AM   Specimen: Nasal Mucosa; Nasal Swab  Result Value Ref Range Status   MRSA, PCR NEGATIVE NEGATIVE Final   Staphylococcus aureus POSITIVE (Fareeha Evon) NEGATIVE Final    Comment: (NOTE) The Xpert SA Assay (FDA approved for NASAL specimens in patients 60 years of age and older), is one component of Aarthi Uyeno comprehensive surveillance program. It is not intended to diagnose infection nor to guide or monitor treatment. Performed at [facility], [place] [address] [place], [place] [address]          Radiology Studies: Ct Chest Wo Contrast  Result Date: 03/27/2019 CLINICAL DATA:  Pt c/o upper midline chest pain; pt denies cough, or SOB. Sickle cell disease. EXAM: CT CHEST WITHOUT CONTRAST TECHNIQUE: Multidetector CT imaging of the chest was performed following the standard protocol without IV  contrast. COMPARISON:  CT abdomen 03/15/2019 FINDINGS: Cardiovascular: RIGHT central venous line. No acute cardiac findings. No pericardial fluid. Mediastinum/Nodes: No axillary or supraclavicular adenopathy. No mediastinal hilar adenopathy. No pericardial effusion. Esophagus normal. Lungs/Pleura: New LEFT pleural effusion and basilar atelectasis compared to CT 03/15/2019. Mild RIGHT basilar atelectasis also increased. No focal infiltrate. No pneumothorax. 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 Musculoskeletal: Diffuse sclerosis throughout the entirety of the skeleton consistent sickle cell change. IMPRESSION: 1. New LEFT pleural effusion and basilar atelectasis. No clear evidence pneumonia. 2. Mild atelectasis at the RIGHT lung base is also new. 3. Sequelae of sickle cell disease with infarcted spleen and dense sclerosis of the bones. Electronically Signed   By: Suzy Bouchard M.D.   On: 03/27/2019 09:44        Scheduled Meds:  amLODipine  5 mg Oral Daily   atorvastatin  20 mg Oral Daily   carvedilol  25 mg Oral BID WC   Chlorhexidine Gluconate Cloth  6 each Topical Daily   feeding supplement  1 Container Oral TID BM   folic acid  1 mg Oral Daily   gabapentin  300 mg Oral Daily   insulin aspart  0-9 Units Subcutaneous TID WC   multivitamin with minerals  1 tablet Oral Daily   mupirocin ointment  1 application Topical BID   nystatin  5 mL Oral QID   pantoprazole  40 mg Oral Daily   sodium chloride flush  10-40 mL Intracatheter Q12H   Continuous Infusions:  sodium chloride 10 mL/hr at 03/21/19 1430   sterile water 1,000 mL with sodium bicarbonate 150 mEq infusion 75 mL/hr at 03/28/19 0332     LOS: 13 days    Time spent: over 30 min    Fayrene Helper, MD [facility] Pager AMION  If 7PM-7AM, please contact night-coverage [email or web address] Password [facility] 03/28/2019, 8:36 AM

## 2019-03-28 NOTE — Progress Notes (Signed)
Occupational Therapy Treatment Patient Details Name: [name] MRN: [id number] DOB: March 25, 1951 Today's Date: 03/28/2019    History of present illness Pt is a 68 y/o male admitted secondary to n/v and RLQ abdominal pain. Imaging indicates recurrent colon cancer in the cecum/TI. Colonoscopy on 10/13 showed malignant tumor in the ascending colon and in the cecum. path showed poorly differentiated carcinoma. Pt underwent exploratory laparotomy surgery on 03/21/19; however, intraoperative findings were that tumor was unresectable. Consults to oncology and palliative were recommended. PMH including but not limited to colon cancer s/p some type of colon resection in 1982, CKD stage 5, DM, HTN, CHF and cocaine abuse.   OT comments  Pt making steady progress towards OT goals this session. Session focus on functional mobility as precursor to higher level ADLs. Pt min guard with RW for simulated toilet tranfers from recliner>EOB. Pt with increased abdominal pain declining further ADLs. Pt able to complete LB ADLs with supervision/ set- up EOB. DC plan remains appropriate. Will continue to follow acutely for OT needs per POC.    Follow Up Recommendations  Home health OT;Supervision/Assistance - 24 hour    Equipment Recommendations  None recommended by OT    Recommendations for Other Services      Precautions / Restrictions Precautions Precautions: Fall Precaution Comments: abdominal pain Restrictions Weight Bearing Restrictions: No       Mobility Bed Mobility Overal bed mobility: Needs Assistance Bed Mobility: Sit to Supine   Sidelying to sit: Supervision   Sit to supine: Supervision   General bed mobility comments: S for safety, no physical assist given  Transfers Overall transfer level: Needs assistance Equipment used: Rolling walker (2 wheeled) Transfers: Sit to/from Stand Sit to Stand: Supervision         General transfer comment: S for safety, no physical assist  given    Balance Overall balance assessment: Needs assistance Sitting-balance support: Feet supported Sitting balance-Leahy Scale: Good Sitting balance - Comments: able to don<>doff shoes from EOB   Standing balance support: Bilateral upper extremity supported Standing balance-Leahy Scale: Poor Standing balance comment: reliance on B UE support                           ADL either performed or assessed with clinical judgement   ADL Overall ADL's : Needs assistance/impaired                     Lower Body Dressing: Supervision/safety;Set up;Sitting/lateral leans Lower Body Dressing Details (indicate cue type and reason): able to don<>doff slippers from EOB Toilet Transfer: Ambulation;RW;Min guard Toilet Transfer Details (indicate cue type and reason): simulated with MIN guard for safety         Functional mobility during ADLs: Min guard;Rolling walker General ADL Comments: Pt with increased abd pain wanting to get back to bed ;  session focus on functional mobility     Vision Patient Visual Report: No change from baseline     Perception     Praxis      Cognition Arousal/Alertness: Awake/alert Behavior During Therapy: WFL for tasks assessed/performed Overall Cognitive Status: No family/caregiver present to determine baseline cognitive functioning Area of Impairment: Memory;Following commands;Problem solving                     Memory: Decreased short-term memory Following Commands: Follows one step commands with increased time;Follows one step commands consistently Safety/Judgement: Decreased awareness of deficits;Decreased awareness of safety  Problem Solving: Requires verbal cues General Comments: cognition improving today, much more alert but continues to require cues for safety; very cooperative and receptive to education        Exercises     Shoulder Instructions       General Comments      Pertinent Vitals/ Pain       Pain  Assessment: Faces Pain Score: 0-No pain Faces Pain Scale: Hurts little more Pain Location: abdomen with movement Pain Descriptors / Indicators: Sore;Grimacing;Guarding;Discomfort Pain Intervention(s): Limited activity within patient's tolerance;Monitored during session;Repositioned  Home Living                                          Prior Functioning/Environment              Frequency  Min 2X/week        Progress Toward Goals  OT Goals(current goals can now be found in the care plan section)  Progress towards OT goals: Progressing toward goals  Acute Rehab OT Goals Patient Stated Goal: to find out what else can be done to treat his cancer OT Goal Formulation: With patient Time For Goal Achievement: 04/07/19 Potential to Achieve Goals: [place] Discharge plan remains appropriate    Co-evaluation                 AM-PAC OT "6 Clicks" Daily Activity     Outcome Measure   Help from another person eating meals?: None Help from another person taking care of personal grooming?: A Little Help from another person toileting, which includes using toliet, bedpan, or urinal?: A Little Help from another person bathing (including washing, rinsing, drying)?: A Little Help from another person to put on and taking off regular upper body clothing?: None Help from another person to put on and taking off regular lower body clothing?: A Lot 6 Click Score: 19    End of Session Equipment Utilized During Treatment: Rolling walker  OT Visit Diagnosis: Unsteadiness on feet (R26.81);Other abnormalities of gait and mobility (R26.89);Pain;Muscle weakness (generalized) (M62.81)   Activity Tolerance Patient tolerated treatment well   Patient Left in bed;with call bell/phone within reach   Nurse Communication Mobility status        Time: 2641-5830 OT Time Calculation (min): 13 min  Charges: OT General Charges $OT Visit: 1 Visit OT  Treatments $Therapeutic Activity: 8-22 mins [place], COTA/L Acute Rehabilitation Services [phone] [place] 03/28/2019, 4:11 PM

## 2019-03-29 DIAGNOSIS — E440 Moderate protein-calorie malnutrition: Secondary | ICD-10-CM | POA: Insufficient documentation

## 2019-03-29 DIAGNOSIS — C182 Malignant neoplasm of ascending colon: Secondary | ICD-10-CM | POA: Diagnosis not present

## 2019-03-29 LAB — CBC
HCT: 27.5 % — ABNORMAL LOW (ref 39.0–52.0)
Hemoglobin: 8.9 g/dL — ABNORMAL LOW (ref 13.0–17.0)
MCH: 25.4 pg — ABNORMAL LOW (ref 26.0–34.0)
MCHC: 32.4 g/dL (ref 30.0–36.0)
MCV: 78.6 fL — ABNORMAL LOW (ref 80.0–100.0)
Platelets: 500 10*3/uL — ABNORMAL HIGH (ref 150–400)
RBC: 3.5 MIL/uL — ABNORMAL LOW (ref 4.22–5.81)
RDW: 21.9 % — ABNORMAL HIGH (ref 11.5–15.5)
WBC: 22.1 10*3/uL — ABNORMAL HIGH (ref 4.0–10.5)
nRBC: 0.2 % (ref 0.0–0.2)

## 2019-03-29 LAB — COMPREHENSIVE METABOLIC PANEL
ALT: 8 U/L (ref 0–44)
AST: 16 U/L (ref 15–41)
Albumin: 2 g/dL — ABNORMAL LOW (ref 3.5–5.0)
Alkaline Phosphatase: 77 U/L (ref 38–126)
Anion gap: 13 (ref 5–15)
BUN: 27 mg/dL — ABNORMAL HIGH (ref 8–23)
CO2: 34 mmol/L — ABNORMAL HIGH (ref 22–32)
Calcium: 8.8 mg/dL — ABNORMAL LOW (ref 8.9–10.3)
Chloride: 93 mmol/L — ABNORMAL LOW (ref 98–111)
Creatinine, Ser: 2.73 mg/dL — ABNORMAL HIGH (ref 0.61–1.24)
GFR calc Af Amer: 26 mL/min — ABNORMAL LOW (ref >60)
GFR calc non Af Amer: 23 mL/min — ABNORMAL LOW (ref >60)
Glucose, Bld: 108 mg/dL — ABNORMAL HIGH (ref 70–99)
Potassium: 3.5 mmol/L (ref 3.5–5.1)
Sodium: 140 mmol/L (ref 135–145)
Total Bilirubin: 0.5 mg/dL (ref 0.3–1.2)
Total Protein: 6.3 g/dL — ABNORMAL LOW (ref 6.5–8.1)

## 2019-03-29 LAB — GLUCOSE, CAPILLARY
Glucose-Capillary: 175 mg/dL — ABNORMAL HIGH (ref 70–99)
Glucose-Capillary: 105 mg/dL — ABNORMAL HIGH (ref 70–99)
Glucose-Capillary: 143 mg/dL — ABNORMAL HIGH (ref 70–99)
Glucose-Capillary: 138 mg/dL — ABNORMAL HIGH (ref 70–99)

## 2019-03-29 LAB — PHOSPHORUS: Phosphorus: 3.2 mg/dL (ref 2.5–4.6)

## 2019-03-29 MED ORDER — STERILE WATER FOR INJECTION IV SOLN
INTRAVENOUS | Status: DC
  Administered 2019-03-29 – 2019-03-30 (×2): via INTRAVENOUS
  Filled 2019-03-29 (×4): qty 850

## 2019-03-29 NOTE — Progress Notes (Signed)
PROGRESS NOTE    [name]  JOI:[id number] DOB: 02-25-1951 DOA: 03/15/2019 PCP: Nolene Ebbs, MD   Brief Narrative:  [name] 68 y.o.malewith medical history significant ofsickle cell anemia (SS disease); ESRDnoton HD; HTN; HLD; DM; and colon CA presenting with n/v. He had [name] vocal cord lesion removed with CO2 laser excision during microlaryngoscopy on 10/7. He went home and then started vomiting the evening of 10.7. No vomiting prior to surgery. It would come and go. He has been losing [name] lot of weight and so has been drinking Boost. Unintentional weight loss of about 50 pounds in the last 4-5 months. He started having night sweats recently. No LAD. Slight diarrhea after the surgery, thought it was related to the anesthesia. Normal BMs other than post-operatively. Stools have been black because he has been taking iron. He got 4 units of blood [name] week or two before the surgery, that increased his Hgb from 2.5 up to 8.9. He has not yet started HD, still making urine.  His ex-wife has been caring for him for the last 3 weeks. His colon cancer was 20 years ago. He has never had [name] colonoscopy in over 25 years.  He was last admitted 8/13-15 for symptomatic anemia from CKD and given IV iron and Epogen.  Upon arrival to ED, patient had c/o abdominal pain and vomiting. Had laryngeal biopsy [name] few days ago. WBC 40k. CT read as progression of colon CA at cecum and TI with ?typhlitis.  Patient was admitted under hospitalist service with GI and surgery consultation.  Patient underwent colonoscopy on 03/18/2019 which showed cecum and ascending colon mass which was biopsied.  Patient's hemoglobin dropped from 7.026.5 on 03/15/2019 and he received 1 unit of PRBC transfusion but then again it dropped to 6.5 so he received second unit of transfusion on 03/17/2019.  General surgery consulted cardiology and patient was cleared for surgery from cardiac standpoint.  Palliative  medicine was also consulted per general surgery recommendations.  Patient decided to proceed with surgery.  Nephrology consulted vascular surgery for AV graft which was placed on 03/20/2019.  Patient then underwent exploratory laparotomy by general surgery on 03/21/2019 however his mass was unresectable.  Patient then expressed wishes to see oncology for consideration of radiation and chemotherapy.  Dr. [name] from oncology saw patient and he recommends against chemotherapy at this point in time due to several comorbidities that patient has.  Palliative care was consulted.  Assessment & Plan:   Principal Problem:   Colon cancer ([name]) Active Problems:   Hypertension   Type 2 diabetes mellitus with renal complication (HCC)   CKD (chronic kidney disease) stage 5, GFR less than 15 ml/min (HCC)   Anemia   Malignant neoplasm of cecum (HCC)   Goals of care, counseling/discussion   Thrombocytosis (HCC)   Malnutrition of moderate degree   Colon cancer. Patient with remote h/o colon cancer (records not apparently available) without subsequent follow-up, ex-wife reports no colonoscopy in 25 years -Presented with RLQ abdominal pain, and n/v; also with 50 pounds unintentional weight loss in the last 4-5 months and recent night sweats -Imaging indicates recurrent colon cancer in the cecum/TI. This appears to be at least stage 3 with positive LN, No obstruction on CT. CEA level-elevated.  Patient underwent colonoscopy on 03/18/2019 which showed cecum and ascending colon mass which was biopsied.  Patient has now decided to proceed with surgery.  Cardiology cleared the patient for surgery from cardiac standpoint stating he will be  high risk candidate due to other medical problems.  Patient was going to have palliative right hemicolectomy and underwent exploratory laparotomy by general surgery on 03/21/2019 however his mass was unresectable so he was closed back.  Oncology was consulted.  Seen by Dr. [name] who noted  pt not candidate for systemic therapy until significant clinical improvement.  Recommend CT chest without contrast when able to complete staging - with new L pleural effusion, atelectasis at R lung base.  Sequelae of sickle cell disease. Appreciate palliative care assistance.  Will continue to discuss with pt and family as well as palliative care/oncology to help clarify plans for dispo (difficult at this time, pt lives alone, therapy recommending 24 hr assistance, he would be unable to go home alone at this time - discussed with son who note family is discussing plan amongst themselves, possible that he may go home with ex wife, but she has significant health issues as well - discussed with ex wife today who notes she's undergoing radiation now and will start chemotherapy this week - she's going to speak to children to see if they can arrange another plan).  Transition to oral pain medications.  Patient remains hopeful that he will be strong enough 1 day to be able to start chemotherapy and HD.  As of now, he is wanting aggressive care but wants to remain DNR.  Left Pleural Effusion: ?malignant with above.  He's doing well from resp standpoint at this time.  Afebrile.  White count is stable.   Will continue to monitor for now, consider thoracentesis if worsening  Stage 5 CKD -Patient has been preparing for HD but has not yet started. Nephrology prn order set utilized.  Seen by nephrology and they consulted vascular surgery for temporary HD and fistula in anticipation for hemodialysis.  Patient underwent left upper arm AV fistula placed on 03/20/2019.  Creatinine improving.  Nephrology now signed off.    Acute blood loss anemia on chronic anemia secondary to colon cancer/lower GI bleed vs sickle cell anemia: -Likely multifactorial including h/o sickle cell disease and stage 5 CKD but most likely due to his recurrence of colon cancer.  Received 1 unit of PRBC on 03/16/2019 and 1 unit again on 03/17/2019.    - stable and follow  Leukocytosis: afebrile, will continue to monitor  HTN -Blood pressure controlled.  Continue Norvasc, Coreg  Chronic diastolic CHF -02/8118 echo with preserved EF and grade 1 diastolic dysfunction -Despite stage 5 CKD, he does not appear to be volume overloaded at this time. -If he becomes volume overloaded, the likely treatment would be HD.  DM -Hemoglobin A1c 6.9 on 03/15/2019.  HoldOnglyza.  Blood sugar reasonable.  Continue SSI.  Follow.  HLD  -Continue Lipitor  Cocaine abuse -Patient denies use since July, but was positive at the time of last admission in August. -UDS negative this time.  Postoperative ileus: Doing well.  Abdominal pain improving.  BM charted from 10/22.  RIJ central line - discontinued on 10/23  DVT prophylaxis: SCD Code Status: DNR Family Communication: none at bedside - discussed with ex wife on 10/24 Disposition Plan: pending further [place] discussions - no safe discharge plan at this time, needs 24 hr supervision   Consultants:   Palliative  Surgery  GI  Cardiology  Vascular  Oncology  Procedures:   Colonoscopy 10/12  AV graft on 10/15  Antimicrobials:  Anti-infectives (From admission, onward)   Start     Dose/Rate Route Frequency Ordered Stop   03/21/19  1545  cefoTEtan (CEFOTAN) 2 g in sodium chloride 0.9 % 100 mL IVPB     2 g 200 mL/hr over 30 Minutes Intravenous To Surgery 03/21/19 1542 03/21/19 1603   03/21/19 1515  piperacillin-tazobactam (ZOSYN) IVPB 2.25 g     2.25 g 100 mL/hr over 30 Minutes Intravenous To Surgery 03/21/19 1504 03/21/19 1532   03/16/19 0000  piperacillin-tazobactam (ZOSYN) IVPB 2.25 g     2.25 g 100 mL/hr over 30 Minutes Intravenous Every 8 hours 03/15/19 1527 03/25/19 2359   03/15/19 1430  piperacillin-tazobactam (ZOSYN) IVPB 3.375 g     3.375 g 12.5 mL/hr over 240 Minutes Intravenous  Once 03/15/19 1423 03/15/19 1915     Subjective: No complaints today  Objective:  Vitals:   03/28/19 1806 03/28/19 2126 03/29/19 0557 03/29/19 1453  BP: 131/77 (!) 141/79 129/73 129/75  Pulse: 72 72 74 78  Resp: (!) 22 16 16 16   Temp: 98.9 F (37.2 C) 99.1 F (37.3 C) 99 F (37.2 C) 98.9 F (37.2 C)  TempSrc: Oral Oral Oral Oral  SpO2: 95% 91% 94% (!) 86%  Weight:      Height:        Intake/Output Summary (Last 24 hours) at 03/29/2019 1725 Last data filed at 03/29/2019 1600 Gross per 24 hour  Intake 2727.55 ml  Output 876 ml  Net 1851.55 ml   Filed Weights   03/18/19 0800 03/21/19 1327  Weight: 64 kg 64 kg    Examination:  General: No acute distress. Cardiovascular: Heart sounds show [place] M[place] regular rate, and rhythm.  Lungs: Clear to auscultation bilaterally  Abdomen: Soft, nontender, nondistended  Neurological: Alert and oriented 3. Moves all extremities 4. Cranial nerves II through XII grossly intact. Skin: Warm and dry. No rashes or lesions. Extremities: No clubbing or cyanosis. No edema.   Data Reviewed: I have personally reviewed following labs and imaging studies  CBC: Recent Labs  Lab 03/23/19 0326 03/24/19 0351 03/25/19 0407 03/26/19 0327 03/26/19 2214 03/27/19 0410 03/28/19 0501 03/29/19 1141  WBC 33.0* 28.2* 25.9* 27.8*  --  22.3* 22.6* 22.1*  NEUTROABS 27.1* 21.4* 19.5* 21.3*  --   --   --   --   HGB 11.0* 9.9* 9.1* 8.2* 8.3* 8.3* 8.1* 8.9*  HCT 33.3* 29.9* 28.1* 26.0* 25.3* 26.1* 25.2* 27.5*  MCV 78.9* 80.6 80.5 80.2  --  80.1 79.7* 78.6*  PLT 518* 429* 442* 411*  --  450* 445* 981*   Basic Metabolic Panel: Recent Labs  Lab 03/25/19 0407 03/26/19 0327 03/27/19 0410 03/28/19 0501 03/29/19 1141  NA 139 139 138 140 140  K 4.5 3.8 3.4* 3.5 3.5  CL 109 105 99 97* 93*  CO2 19* 23 27 32 34*  GLUCOSE 133* 148* 143* 160* 108*  BUN 65* 61* 49* 39* 27*  CREATININE 4.64* 4.43* 3.51* 2.93* 2.73*  CALCIUM 9.3 8.7* 8.5* 8.5* 8.8*  MG  --   --  2.1 2.1  --   PHOS 4.2 3.3 3.4 3.3 3.2   GFR: Estimated Creatinine Clearance: 22.8  mL/min (Milia Warth) (by C-G formula based on SCr of 2.73 mg/dL (H)). Liver Function Tests: Recent Labs  Lab 03/25/19 0407 03/26/19 0327 03/27/19 0410 03/28/19 0501 03/29/19 1141  AST  --   --   --   --  16  ALT  --   --   --   --  8  ALKPHOS  --   --   --   --  20  BILITOT  --   --   --   --  0.5  PROT  --   --   --   --  6.3*  ALBUMIN 2.0* 1.8* 1.9* 1.8* 2.0*   No results for input(s): LIPASE, AMYLASE in the last 168 hours. No results for input(s): AMMONIA in the last 168 hours. Coagulation Profile: No results for input(s): INR, PROTIME in the last 168 hours. Cardiac Enzymes: No results for input(s): CKTOTAL, CKMB, CKMBINDEX, TROPONINI in the last 168 hours. BNP (last 3 results) No results for input(s): PROBNP in the last 8760 hours. HbA1C: No results for input(s): HGBA1C in the last 72 hours. CBG: Recent Labs  Lab 03/28/19 1735 03/28/19 2123 03/29/19 0852 03/29/19 1146 03/29/19 1617  GLUCAP 178* 124* 175* 105* 143*   Lipid Profile: No results for input(s): CHOL, HDL, LDLCALC, TRIG, CHOLHDL, LDLDIRECT in the last 72 hours. Thyroid Function Tests: No results for input(s): TSH, T4TOTAL, FREET4, T3FREE, THYROIDAB in the last 72 hours. Anemia Panel: No results for input(s): VITAMINB12, FOLATE, FERRITIN, TIBC, IRON, RETICCTPCT in the last 72 hours. Sepsis Labs: No results for input(s): PROCALCITON, LATICACIDVEN in the last 168 hours.  Recent Results (from the past 240 hour(s))  Surgical pcr screen     Status: Abnormal   Collection Time: 03/20/19  1:16 AM   Specimen: Nasal Mucosa; Nasal Swab  Result Value Ref Range Status   MRSA, PCR NEGATIVE NEGATIVE Final   Staphylococcus aureus POSITIVE (Kylii Ennis) NEGATIVE Final    Comment: (NOTE) The Xpert SA Assay (FDA approved for NASAL specimens in patients 77 years of age and older), is one component of Tayshaun Kroh comprehensive surveillance program. It is not intended to diagnose infection nor to guide or monitor treatment. Performed at [facility], [place] [address] [place], [place] [address]          Radiology Studies: No results found.      Scheduled Meds: . amLODipine  5 mg Oral Daily  . atorvastatin  20 mg Oral Daily  . carvedilol  25 mg Oral BID WC  . Chlorhexidine Gluconate Cloth  6 each Topical Daily  . feeding supplement  1 Container Oral TID BM  . folic acid  1 mg Oral Daily  . gabapentin  300 mg Oral Daily  . insulin aspart  0-9 Units Subcutaneous TID WC  . multivitamin with minerals  1 tablet Oral Daily  . mupirocin ointment  1 application Topical BID  . nystatin  5 mL Oral QID  . pantoprazole  40 mg Oral Daily  . sodium chloride flush  10-40 mL Intracatheter Q12H   Continuous Infusions: . sodium chloride 10 mL/hr at 03/21/19 1430  . sterile water 850 mL with sodium bicarbonate 150 mEq infusion 75 mL/hr at 03/29/19 1000     LOS: 14 days    Time spent: over 30 min    Fayrene Helper, MD [facility] Pager [email or web address]  If 7PM-7AM, please contact night-coverage [email or web address] Password [email or web address] 03/29/2019, 5:25 PM

## 2019-03-30 DIAGNOSIS — C182 Malignant neoplasm of ascending colon: Secondary | ICD-10-CM | POA: Diagnosis not present

## 2019-03-30 LAB — CBC
HCT: 25.1 % — ABNORMAL LOW (ref 39.0–52.0)
Hemoglobin: 8.1 g/dL — ABNORMAL LOW (ref 13.0–17.0)
MCH: 25.9 pg — ABNORMAL LOW (ref 26.0–34.0)
MCHC: 32.3 g/dL (ref 30.0–36.0)
MCV: 80.2 fL (ref 80.0–100.0)
Platelets: 449 10*3/uL — ABNORMAL HIGH (ref 150–400)
RBC: 3.13 MIL/uL — ABNORMAL LOW (ref 4.22–5.81)
RDW: 22.5 % — ABNORMAL HIGH (ref 11.5–15.5)
WBC: 22.2 10*3/uL — ABNORMAL HIGH (ref 4.0–10.5)
nRBC: 0.2 % (ref 0.0–0.2)

## 2019-03-30 LAB — COMPREHENSIVE METABOLIC PANEL
ALT: 7 U/L (ref 0–44)
AST: 16 U/L (ref 15–41)
Albumin: 1.9 g/dL — ABNORMAL LOW (ref 3.5–5.0)
Alkaline Phosphatase: 77 U/L (ref 38–126)
Anion gap: 13 (ref 5–15)
BUN: 23 mg/dL (ref 8–23)
CO2: 37 mmol/L — ABNORMAL HIGH (ref 22–32)
Calcium: 8.5 mg/dL — ABNORMAL LOW (ref 8.9–10.3)
Chloride: 86 mmol/L — ABNORMAL LOW (ref 98–111)
Creatinine, Ser: 2.85 mg/dL — ABNORMAL HIGH (ref 0.61–1.24)
GFR calc Af Amer: 25 mL/min — ABNORMAL LOW (ref >60)
GFR calc non Af Amer: 21 mL/min — ABNORMAL LOW (ref >60)
Glucose, Bld: 184 mg/dL — ABNORMAL HIGH (ref 70–99)
Potassium: 3.8 mmol/L (ref 3.5–5.1)
Sodium: 136 mmol/L (ref 135–145)
Total Bilirubin: 1 mg/dL (ref 0.3–1.2)
Total Protein: 5.9 g/dL — ABNORMAL LOW (ref 6.5–8.1)

## 2019-03-30 LAB — GLUCOSE, CAPILLARY
Glucose-Capillary: 198 mg/dL — ABNORMAL HIGH (ref 70–99)
Glucose-Capillary: 129 mg/dL — ABNORMAL HIGH (ref 70–99)
Glucose-Capillary: 129 mg/dL — ABNORMAL HIGH (ref 70–99)
Glucose-Capillary: 190 mg/dL — ABNORMAL HIGH (ref 70–99)

## 2019-03-30 LAB — MAGNESIUM: Magnesium: 1.9 mg/dL (ref 1.7–2.4)

## 2019-03-30 NOTE — Progress Notes (Signed)
   03/30/19 1434  [place] Prevention Plan - Extreme Risk  Transportation Screening Complete  Medication Review Complete  PCP or Specialist appointment within 3-5 days of discharge Complete  Home Care Consult Complete  Social Work consult for recovery care planning/counseling (includes patient and caregiver) Complete  Palliative Care Screening Complete (patient would like more information on pallative care)  [place] Complete

## 2019-03-30 NOTE — TOC Transition Note (Signed)
Transition of Care [facility]) - CM/SW Discharge Note   Patient Details  Name: [name] MRN: [id number] Date of Birth: [birth year]  [facility] CM/SW Contact:  Gabrielle Dare Phone Number: 03/30/2019, 2:15 PM   Clinical Narrative:    CSW met with patient at bedside. Patient would like to receive information on palliative care.  Patient is agreeable for SNF.  CSW gave patient medicare.gov list with SNF facilities to select.   Patient and family will select a SNF for disposition planning. Patient rely on ex-wife for transportation needs to doctor appointments.  Patient reports that he has no problems getting medications or making doctor appointments.     Final next level of care: Home/Self Care Barriers to Discharge: Continued Medical Work up   Patient Goals and CMS Choice Patient states their goals for this hospitalization and ongoing recovery are:: to go home CMS Medicare.gov Compare Post Acute Care list provided to:: Patient Choice offered to / list presented to : Patient  Discharge Placement                       Discharge Plan and Services In-house Referral: Clinical Social Work Discharge Planning Services: CM Consult Post Acute Care Choice: Home Health          DME Arranged: N/A         HH Arranged: Patient Refused [place]          Social Determinants of Health (SDOH) Interventions     Readmission Risk Interventions Readmission Risk Prevention Plan 03/19/2019 01/18/2019  Transportation Screening Complete Complete  PCP or Specialist Appt within 3-5 Days - Complete  HRI or [place] - Complete  Social Work Consult for [place] Planning/Counseling - Complete  Palliative Care Screening - Not Applicable  Medication Review Press photographer) Complete Complete  PCP or Specialist appointment within 3-5 days of discharge Complete -  [place] or Home Care Consult Complete -  SW Recovery Care/Counseling Consult Complete -  Palliative Care  Screening Not Applicable -  Skilled Nursing Facility Complete -  Some recent data might be hidden

## 2019-03-30 NOTE — Progress Notes (Signed)
PROGRESS NOTE    [name]  OIN:[id number] DOB: [birth year] DOA: 03/15/2019 PCP: Nolene Ebbs, MD   Brief Narrative:  [name] a 68 y.o.malewith medical history significant ofsickle cell anemia (SS disease); ESRDnoton HD; HTN; HLD; DM; and colon CA presenting with n/v. He had a vocal cord lesion removed with CO2 laser excision during microlaryngoscopy on 10/7. He went home and then started vomiting the evening of 10.7. No vomiting prior to surgery. It would come and go. He has been losing a lot of weight and so has been drinking Boost. Unintentional weight loss of about 50 pounds in the last 4-5 months. He started having night sweats recently. No LAD. Slight diarrhea after the surgery, thought it was related to the anesthesia. Normal BMs other than post-operatively. Stools have been black because he has been taking iron. He got 4 units of blood a week or two before the surgery, that increased his Hgb from 2.5 up to 8.9. He has not yet started HD, still making urine.  His ex-wife has been caring for him for the last 3 weeks. His colon cancer was 20 years ago. He has never had a colonoscopy in over 25 years.  He was last admitted 8/13-15 for symptomatic anemia from CKD and given IV iron and Epogen.  Upon arrival to ED, patient had c/o abdominal pain and vomiting. Had laryngeal biopsy a few days ago. WBC 40k. CT read as progression of colon CA at cecum and TI with ?typhlitis.  Patient was admitted under hospitalist service with GI and surgery consultation.  Patient underwent colonoscopy on 03/18/2019 which showed cecum and ascending colon mass which was biopsied.  Patient's hemoglobin dropped from 7.026.5 on 03/15/2019 and he received 1 unit of PRBC transfusion but then again it dropped to 6.5 so he received second unit of transfusion on 03/17/2019.  General surgery consulted cardiology and patient was cleared for surgery from cardiac standpoint.  Palliative  medicine was also consulted per general surgery recommendations.  Patient decided to proceed with surgery.  Nephrology consulted vascular surgery for AV graft which was placed on 03/20/2019.  Patient then underwent exploratory laparotomy by general surgery on 03/21/2019 however his mass was unresectable.  Patient then expressed wishes to see oncology for consideration of radiation and chemotherapy.  Dr. [name] from oncology saw patient and he recommends against chemotherapy at this point in time due to several comorbidities that patient has.  Palliative care was consulted.  Assessment & Plan:   Principal Problem:   Colon cancer ([place]) Active Problems:   Hypertension   Type 2 diabetes mellitus with renal complication (HCC)   CKD (chronic kidney disease) stage 5, GFR less than 15 ml/min (HCC)   Anemia   Malignant neoplasm of cecum (HCC)   Goals of care, counseling/discussion   Thrombocytosis (HCC)   Malnutrition of moderate degree   Colon cancer. Patient with remote h/o colon cancer (records not apparently available) without subsequent follow-up, ex-wife reports no colonoscopy in 25 years -Presented with RLQ abdominal pain, and n/v; also with 50 pounds unintentional weight loss in the last 4-5 months and recent night sweats -Imaging indicates recurrent colon cancer in the cecum/TI. This appears to be at least stage 3 with positive LN, No obstruction on CT. CEA level-elevated.  Patient underwent colonoscopy on 03/18/2019 which showed cecum and ascending colon mass which was biopsied.  Patient has now decided to proceed with surgery.  Cardiology cleared the patient for surgery from cardiac standpoint stating he will be  high risk candidate due to other medical problems.  Patient was going to have palliative right hemicolectomy and underwent exploratory laparotomy by general surgery on 03/21/2019 however his mass was unresectable so he was closed back.  Oncology was consulted.  Seen by Dr. [name] who noted  pt not candidate for systemic therapy until significant clinical improvement.  Recommend CT chest without contrast when able to complete staging - with new L pleural effusion, atelectasis at R lung base.  Sequelae of sickle cell disease. Appreciate palliative care assistance.  Will continue to discuss with pt and family as well as palliative care/oncology to help clarify plans for dispo (difficult at this time, pt lives alone, therapy recommending 24 hr assistance, he would be unable to go home alone at this time - discussed with son who note family is discussing plan amongst themselves, possible that he may go home with ex wife, but she has significant health issues as well - discussed with ex wife 10/24 who notes she's undergoing radiation now and will start chemotherapy this week - she's going to speak to children to see if they can arrange another plan).  I've asked palliative care to see if they can help with d/c planning as well.  Therapy currently recommending 24 hr supervision/assistance - not currently safe for discharge as this has not been arranged.  Transition to oral pain medications.  Patient remains hopeful that he will be strong enough 1 day to be able to start chemotherapy and HD.  As of now, he is wanting aggressive care but wants to remain DNR.  Left Pleural Effusion: ?malignant with above.  He's doing well from resp standpoint at this time.  Afebrile.  White count is stable.   Will continue to monitor for now, consider thoracentesis if worsening  Stage 5 CKD -Patient has been preparing for HD but has not yet started. Nephrology prn order set utilized.  Seen by nephrology and they consulted vascular surgery for temporary HD and fistula in anticipation for hemodialysis.  Patient underwent left upper arm AV fistula placed on 03/20/2019.  Creatinine improving.  Nephrology now signed off.   Metabolic Alkalosis: 2/2 bicarb, will discontinue  Acute blood loss anemia on chronic anemia  secondary to colon cancer/lower GI bleed vs sickle cell anemia: -Likely multifactorial including h/o sickle cell disease and stage 5 CKD but most likely due to his recurrence of colon cancer.  Received 1 unit of PRBC on 03/16/2019 and 1 unit again on 03/17/2019.   - stable and follow  Leukocytosis: afebrile, will continue to monitor  HTN -Blood pressure controlled.  Continue Norvasc, Coreg  Chronic diastolic CHF -53/2992 echo with preserved EF and grade 1 diastolic dysfunction -Despite stage 5 CKD, he does not appear to be volume overloaded at this time. -If he becomes volume overloaded, the likely treatment would be HD.  DM -Hemoglobin A1c 6.9 on 03/15/2019.  HoldOnglyza.  Blood sugar reasonable.  Continue SSI.  Follow.  HLD  -Continue Lipitor  Cocaine abuse -Patient denies use since July, but was positive at the time of last admission in August. -UDS negative this time.  Postoperative ileus: Doing well.  Abdominal pain improving.  BM charted from 10/22.  RIJ central line - discontinued on 10/23  DVT prophylaxis: SCD Code Status: DNR Family Communication: none at bedside - discussed with ex wife on 10/24 Disposition Plan: pending further [place] discussions - no safe discharge plan at this time, needs 24 hr supervision, but currently no plan for him to go  home where he'd have enough support at home.     Consultants:   Palliative  Surgery  GI  Cardiology  Vascular  Oncology  Procedures:   Colonoscopy 10/12  AV graft on 10/15  Antimicrobials:  Anti-infectives (From admission, onward)   Start     Dose/Rate Route Frequency Ordered Stop   03/21/19 1545  cefoTEtan (CEFOTAN) 2 g in sodium chloride 0.9 % 100 mL IVPB     2 g 200 mL/hr over 30 Minutes Intravenous To Surgery 03/21/19 1542 03/21/19 1603   03/21/19 1515  piperacillin-tazobactam (ZOSYN) IVPB 2.25 g     2.25 g 100 mL/hr over 30 Minutes Intravenous To Surgery 03/21/19 1504 03/21/19 1532   03/16/19  0000  piperacillin-tazobactam (ZOSYN) IVPB 2.25 g     2.25 g 100 mL/hr over 30 Minutes Intravenous Every 8 hours 03/15/19 1527 03/25/19 2359   03/15/19 1430  piperacillin-tazobactam (ZOSYN) IVPB 3.375 g     3.375 g 12.5 mL/hr over 240 Minutes Intravenous  Once 03/15/19 1423 03/15/19 1915     Subjective: Denies any complaints.  Objective: Vitals:   03/29/19 0557 03/29/19 1453 03/29/19 2052 03/30/19 0500  BP: 129/73 129/75 (!) 146/68 133/65  Pulse: 74 78 79 78  Resp: 16 16 16 16   Temp: 99 F (37.2 C) 98.9 F (37.2 C) 99 F (37.2 C) 99.9 F (37.7 C)  TempSrc: Oral Oral Oral Oral  SpO2: 94% (!) 86% 100% 95%  Weight:      Height:        Intake/Output Summary (Last 24 hours) at 03/30/2019 1426 Last data filed at 03/30/2019 0500 Gross per 24 hour  Intake 627.98 ml  Output -  Net 627.98 ml   Filed Weights   03/18/19 0800 03/21/19 1327  Weight: 64 kg 64 kg    Examination:  General: No acute distress. Cardiovascular: Heart sounds show a regular rate, and rhythm.  Lungs: Clear to auscultation bilaterally  Abdomen: Soft, nontender, nondistended.  Some dehiscence to abdominal wound, mild. Neurological: Alert and oriented 3. Moves all extremities 4. Cranial nerves II through XII grossly intact. Skin: Warm and dry. No rashes or lesions. Extremities: No clubbing or cyanosis. No edema.    Data Reviewed: I have personally reviewed following labs and imaging studies  CBC: Recent Labs  Lab 03/24/19 0351 03/25/19 0407 03/26/19 0327 03/26/19 2214 03/27/19 0410 03/28/19 0501 03/29/19 1141 03/30/19 0705  WBC 28.2* 25.9* 27.8*  --  22.3* 22.6* 22.1* 22.2*  NEUTROABS 21.4* 19.5* 21.3*  --   --   --   --   --   HGB 9.9* 9.1* 8.2* 8.3* 8.3* 8.1* 8.9* 8.1*  HCT 29.9* 28.1* 26.0* 25.3* 26.1* 25.2* 27.5* 25.1*  MCV 80.6 80.5 80.2  --  80.1 79.7* 78.6* 80.2  PLT 429* 442* 411*  --  450* 445* 500* 606*   Basic Metabolic Panel: Recent Labs  Lab 03/25/19 0407 03/26/19 0327  03/27/19 0410 03/28/19 0501 03/29/19 1141 03/30/19 0705  NA 139 139 138 140 140 136  K 4.5 3.8 3.4* 3.5 3.5 3.8  CL 109 105 99 97* 93* 86*  CO2 19* 23 27 32 34* 37*  GLUCOSE 133* 148* 143* 160* 108* 184*  BUN 65* 61* 49* 39* 27* 23  CREATININE 4.64* 4.43* 3.51* 2.93* 2.73* 2.85*  CALCIUM 9.3 8.7* 8.5* 8.5* 8.8* 8.5*  MG  --   --  2.1 2.1  --  1.9  PHOS 4.2 3.3 3.4 3.3 3.2  --    GFR:  Estimated Creatinine Clearance: 21.8 mL/min (A) (by C-G formula based on SCr of 2.85 mg/dL (H)). Liver Function Tests: Recent Labs  Lab 03/26/19 0327 03/27/19 0410 03/28/19 0501 03/29/19 1141 03/30/19 0705  AST  --   --   --  16 16  ALT  --   --   --  8 7  ALKPHOS  --   --   --  77 77  BILITOT  --   --   --  0.5 1.0  PROT  --   --   --  6.3* 5.9*  ALBUMIN 1.8* 1.9* 1.8* 2.0* 1.9*   No results for input(s): LIPASE, AMYLASE in the last 168 hours. No results for input(s): AMMONIA in the last 168 hours. Coagulation Profile: No results for input(s): INR, PROTIME in the last 168 hours. Cardiac Enzymes: No results for input(s): CKTOTAL, CKMB, CKMBINDEX, TROPONINI in the last 168 hours. BNP (last 3 results) No results for input(s): PROBNP in the last 8760 hours. HbA1C: No results for input(s): HGBA1C in the last 72 hours. CBG: Recent Labs  Lab 03/29/19 1146 03/29/19 1617 03/29/19 2047 03/30/19 0803 03/30/19 1231  GLUCAP 105* 143* 138* 198* 129*   Lipid Profile: No results for input(s): CHOL, HDL, LDLCALC, TRIG, CHOLHDL, LDLDIRECT in the last 72 hours. Thyroid Function Tests: No results for input(s): TSH, T4TOTAL, FREET4, T3FREE, THYROIDAB in the last 72 hours. Anemia Panel: No results for input(s): VITAMINB12, FOLATE, FERRITIN, TIBC, IRON, RETICCTPCT in the last 72 hours. Sepsis Labs: No results for input(s): PROCALCITON, LATICACIDVEN in the last 168 hours.  No results found for this or any previous visit (from the past 240 hour(s)).       Radiology Studies: No results found.       Scheduled Meds: . amLODipine  5 mg Oral Daily  . atorvastatin  20 mg Oral Daily  . carvedilol  25 mg Oral BID WC  . Chlorhexidine Gluconate Cloth  6 each Topical Daily  . feeding supplement  1 Container Oral TID BM  . folic acid  1 mg Oral Daily  . gabapentin  300 mg Oral Daily  . insulin aspart  0-9 Units Subcutaneous TID WC  . multivitamin with minerals  1 tablet Oral Daily  . mupirocin ointment  1 application Topical BID  . nystatin  5 mL Oral QID  . pantoprazole  40 mg Oral Daily  . sodium chloride flush  10-40 mL Intracatheter Q12H   Continuous Infusions: . sodium chloride 10 mL/hr at 03/21/19 1430  . sterile water 850 mL with sodium bicarbonate 150 mEq infusion 75 mL/hr at 03/30/19 0205     LOS: 15 days    Time spent: over 47 min    Fayrene Helper, MD [facility] Pager [email or web address]  If 7PM-7AM, please contact night-coverage [email or web address] Password [email or web address] 03/30/2019, 2:26 PM

## 2019-03-31 DIAGNOSIS — C182 Malignant neoplasm of ascending colon: Secondary | ICD-10-CM | POA: Diagnosis not present

## 2019-03-31 DIAGNOSIS — C180 Malignant neoplasm of cecum: Secondary | ICD-10-CM | POA: Diagnosis not present

## 2019-03-31 DIAGNOSIS — N186 End stage renal disease: Secondary | ICD-10-CM | POA: Diagnosis not present

## 2019-03-31 DIAGNOSIS — K37 Unspecified appendicitis: Secondary | ICD-10-CM | POA: Diagnosis not present

## 2019-03-31 DIAGNOSIS — Z7189 Other specified counseling: Secondary | ICD-10-CM | POA: Diagnosis not present

## 2019-03-31 LAB — CBC
HCT: 26.6 % — ABNORMAL LOW (ref 39.0–52.0)
Hemoglobin: 8.7 g/dL — ABNORMAL LOW (ref 13.0–17.0)
MCH: 26 pg (ref 26.0–34.0)
MCHC: 32.7 g/dL (ref 30.0–36.0)
MCV: 79.4 fL — ABNORMAL LOW (ref 80.0–100.0)
Platelets: 502 10*3/uL — ABNORMAL HIGH (ref 150–400)
RBC: 3.35 MIL/uL — ABNORMAL LOW (ref 4.22–5.81)
RDW: 22.2 % — ABNORMAL HIGH (ref 11.5–15.5)
WBC: 24.1 10*3/uL — ABNORMAL HIGH (ref 4.0–10.5)
nRBC: 0.3 % — ABNORMAL HIGH (ref 0.0–0.2)

## 2019-03-31 LAB — COMPREHENSIVE METABOLIC PANEL
ALT: 8 U/L (ref 0–44)
AST: 18 U/L (ref 15–41)
Albumin: 1.9 g/dL — ABNORMAL LOW (ref 3.5–5.0)
Alkaline Phosphatase: 76 U/L (ref 38–126)
Anion gap: 12 (ref 5–15)
BUN: 23 mg/dL (ref 8–23)
CO2: 39 mmol/L — ABNORMAL HIGH (ref 22–32)
Calcium: 8.6 mg/dL — ABNORMAL LOW (ref 8.9–10.3)
Chloride: 86 mmol/L — ABNORMAL LOW (ref 98–111)
Creatinine, Ser: 3.07 mg/dL — ABNORMAL HIGH (ref 0.61–1.24)
GFR calc Af Amer: 23 mL/min — ABNORMAL LOW (ref >60)
GFR calc non Af Amer: 20 mL/min — ABNORMAL LOW (ref >60)
Glucose, Bld: 157 mg/dL — ABNORMAL HIGH (ref 70–99)
Potassium: 3.5 mmol/L (ref 3.5–5.1)
Sodium: 137 mmol/L (ref 135–145)
Total Bilirubin: 0.5 mg/dL (ref 0.3–1.2)
Total Protein: 6 g/dL — ABNORMAL LOW (ref 6.5–8.1)

## 2019-03-31 LAB — MAGNESIUM: Magnesium: 2 mg/dL (ref 1.7–2.4)

## 2019-03-31 LAB — GLUCOSE, CAPILLARY
Glucose-Capillary: 159 mg/dL — ABNORMAL HIGH (ref 70–99)
Glucose-Capillary: 166 mg/dL — ABNORMAL HIGH (ref 70–99)
Glucose-Capillary: 77 mg/dL (ref 70–99)
Glucose-Capillary: 166 mg/dL — ABNORMAL HIGH (ref 70–99)

## 2019-03-31 NOTE — Care Management Important Message (Signed)
Important Message  Patient Details  Name: [name] MRN: [id number] Date of Birth: 04/07/1951   Medicare Important Message Given:  Yes     Shelda Altes 03/31/2019, 3:41 PM

## 2019-03-31 NOTE — NC FL2 (Signed)
[place] LEVEL OF CARE SCREENING TOOL     IDENTIFICATION  Patient Name: [name] Birthdate: 11-24-1950 Sex: male Admission Date (Current Location): 03/15/2019  [facility] and [place] Number:  Herbalist and Address:  The . [facility], [place] [address] [place], [place] [address]      Provider Number: [id number]  Attending Physician Name and Address:  Elodia Florence., *  Relative Name and Phone Number:       Current Level of Care: Hospital Recommended Level of Care: [place] Prior Approval Number:    Date Approved/Denied:   PASRR Number:   [id number]  Discharge Plan: SNF    Current Diagnoses: Patient Active Problem List   Diagnosis Date Noted  . Malnutrition of moderate degree 03/29/2019  . Thrombocytosis ([place])   . Goals of care, counseling/discussion   . Malignant neoplasm of cecum ([place])   . Colon cancer ([place]) 03/15/2019  . Anemia 03/15/2019  . Congestive heart failure (CHF) ([place]) 01/17/2019  . Acute on chronic diastolic CHF (congestive heart failure) ([place]) 01/16/2019  . Hyponatremia 01/16/2019  . Sepsis ([place]) 02/08/2018  . Opiate overdose ([place]) 11/13/2017  . Acute urinary obstruction 03/29/2017  . Symptomatic anemia   . Hb-SS disease with crisis ([place]) 03/27/2017  . Sickle cell pain crisis ([place]) 01/23/2017  . Heart murmur 03/27/2016  . CKD (chronic kidney disease) stage 5, GFR less than 15 ml/min (HCC) 03/26/2016  . Cocaine abuse ([place]) 03/25/2016  . Renal failure (ARF), acute on chronic (HCC)   . Hb-SS disease without crisis ([place])   . Hyperkalemia, diminished renal excretion 09/03/2014  . Leukocytosis 07/28/2011  . LBBB (left bundle branch block) 07/28/2011  . Hypertension   . Type 2 diabetes mellitus with renal complication (HCC)     Orientation RESPIRATION BLADDER Height & Weight     Self, Time, Situation, Place  Normal Continent Weight: 141 lb 1.5 oz (64 kg) Height:  6\' 1"  (185.4 cm)   BEHAVIORAL SYMPTOMS/MOOD NEUROLOGICAL BOWEL NUTRITION STATUS      Continent Diet(see discharge summary)  AMBULATORY STATUS COMMUNICATION OF NEEDS Skin   Extensive Assist Verbally Surgical wounds, Other (Comment)(closed incision on left arm; av fistula on left arm; closed incision on abdomen)                       Personal Care Assistance Level of Assistance  Bathing, Dressing, Feeding Bathing Assistance: Maximum assistance Feeding assistance: Independent Dressing Assistance: Maximum assistance     Functional Limitations Info  Sight, Hearing, Speech Sight Info: Adequate Hearing Info: Adequate Speech Info: Adequate    SPECIAL CARE FACTORS FREQUENCY  PT (By licensed PT), OT (By licensed OT)     PT Frequency: 5x week OT Frequency: 5x week            Contractures Contractures Info: Not present    Additional Factors Info  Code Status, Allergies, Insulin Sliding Scale Code Status Info: DNR Allergies Info: Morphine and Related   Insulin Sliding Scale Info: insulin aspart (novoLOG) injection 0-9 Units 3x daily with meals;       Current Medications (03/31/2019):  This is the current hospital active medication list Current Facility-Administered Medications  Medication Dose Route Frequency Provider Last Rate Last Dose  . 0.9 %  sodium chloride infusion   Intravenous Continuous Focht, Jessica L, PA 10 mL/hr at 03/21/19 1430    . acetaminophen (TYLENOL) tablet 650 mg  650 mg Oral Q6H PRN  Kalman Drape, PA   650 mg at 03/19/19 0236   Or  . acetaminophen (TYLENOL) suppository 650 mg  650 mg Rectal Q6H PRN Focht, Jessica L, PA      . albuterol (PROVENTIL) (2.5 MG/3ML) 0.083% nebulizer solution 2.5 mg  2.5 mg Inhalation Q6H PRN Focht, Jessica L, PA      . amLODipine (NORVASC) tablet 5 mg  5 mg Oral Daily Focht, Jessica L, PA   5 mg at 03/31/19 1038  . atorvastatin (LIPITOR) tablet 20 mg  20 mg Oral Daily Focht, Jessica L, PA   20 mg at 03/31/19 1038  . calcium carbonate  (dosed in mg elemental calcium) suspension 500 mg of elemental calcium  500 mg of elemental calcium Oral Q6H PRN Focht, Jessica L, PA      . camphor-menthol (SARNA) lotion 1 application  1 application Topical B0J PRN Focht, Jessica L, PA       And  . hydrOXYzine (ATARAX/VISTARIL) tablet 25 mg  25 mg Oral Q8H PRN Focht, Jessica L, PA      . carvedilol (COREG) tablet 25 mg  25 mg Oral BID WC Focht, Jessica L, PA   25 mg at 03/31/19 1038  . Chlorhexidine Gluconate Cloth 2 % PADS 6 each  6 each Topical Daily Focht, Jessica L, PA   6 each at 03/31/19 1039  . docusate sodium (ENEMEEZ) enema 283 mg  1 enema Rectal PRN Focht, Jessica L, PA      . feeding supplement (BOOST / RESOURCE BREEZE) liquid 1 Container  1 Container Oral TID BM Elodia Florence., MD   1 Container at 62/83/66 2947  . folic acid (FOLVITE) tablet 1 mg  1 mg Oral Daily Focht, Jessica L, PA   1 mg at 03/31/19 1038  . gabapentin (NEURONTIN) capsule 300 mg  300 mg Oral Daily Darliss Cheney, MD   300 mg at 03/31/19 1038  . HYDROmorphone (DILAUDID) injection 0.5-1 mg  0.5-1 mg Intravenous Q2H PRN Focht, Jessica L, PA   1 mg at 03/28/19 0756  . insulin aspart (novoLOG) injection 0-9 Units  0-9 Units Subcutaneous TID WC Focht, Jessica L, PA   2 Units at 03/31/19 1333  . lidocaine (XYLOCAINE) 1 % (with pres) injection    PRN Corrie Mckusick, DO   5 mL at 03/17/19 2005  . menthol-cetylpyridinium (CEPACOL) lozenge 3 mg  1 lozenge Oral PRN Focht, Jessica L, PA      . multivitamin with minerals tablet 1 tablet  1 tablet Oral Daily Darliss Cheney, MD   1 tablet at 03/31/19 1038  . mupirocin ointment (BACTROBAN) 2 % 1 application  1 application Topical BID Kalman Drape, PA   1 application at 65/46/50 0013  . nystatin (MYCOSTATIN) 100000 UNIT/ML suspension 500,000 Units  5 mL Oral QID Elodia Florence., MD   500,000 Units at 03/31/19 1333  . ondansetron (ZOFRAN) tablet 4 mg  4 mg Oral Q6H PRN Focht, Jessica L, PA       Or  . ondansetron  (ZOFRAN) injection 4 mg  4 mg Intravenous Q6H PRN Focht, Jessica L, PA   4 mg at 03/26/19 1938  . oxyCODONE (Oxy IR/ROXICODONE) immediate release tablet 5 mg  5 mg Oral Q4H PRN Elodia Florence., MD   5 mg at 03/29/19 1633   Or  . oxyCODONE (Oxy IR/ROXICODONE) immediate release tablet 10 mg  10 mg Oral Q4H PRN Elodia Florence., MD   10  mg at 03/31/19 0611  . pantoprazole (PROTONIX) EC tablet 40 mg  40 mg Oral Daily Elodia Florence., MD   40 mg at 03/31/19 1038  . sodium chloride flush (NS) 0.9 % injection 10-40 mL  10-40 mL Intracatheter Q12H Focht, Jessica L, PA   10 mL at 03/30/19 1025  . sodium chloride flush (NS) 0.9 % injection 10-40 mL  10-40 mL Intracatheter PRN Focht, Jessica L, PA      . sorbitol 70 % solution 30 mL  30 mL Oral PRN Focht, Jessica L, PA      . zolpidem (AMBIEN) tablet 5 mg  5 mg Oral QHS PRN Kalman Drape, PA         Discharge Medications: Please see discharge summary for a list of discharge medications.  Relevant Imaging Results:  Relevant Lab Results:   Additional Information [id number] [place], [place]

## 2019-03-31 NOTE — Progress Notes (Signed)
Occupational Therapy Treatment Patient Details Name: [name] MRN: [id number] DOB: February 09, 1951 Today's Date: 03/31/2019    History of present illness Pt is a 68 y/o male admitted secondary to n/v and RLQ abdominal pain. Imaging indicates recurrent colon cancer in the cecum/TI. Colonoscopy on 10/13 showed malignant tumor in the ascending colon and in the cecum. path showed poorly differentiated carcinoma. Pt underwent exploratory laparotomy surgery on 03/21/19; however, intraoperative findings were that tumor was unresectable. Consults to oncology and palliative were recommended. PMH including but not limited to colon cancer s/p some type of colon resection in 1982, CKD stage 5, DM, HTN, CHF and cocaine abuse.   OT comments  Pt making steady progress towards OT goals this session. Session focus on functional mobility and seated ADLs. Pt slow to process this session and slow to initiate movement, likely d/t pt sleeping most of day.  Pt MIN Guard for functional mobility from EOB>recliner with RW. Pt complete LB ADL supervision/ set-up assist. DC plan remains appropriate. Will continue to follow acutely per POC.     Follow Up Recommendations  Home health OT;Supervision/Assistance - 24 hour    Equipment Recommendations  None recommended by OT    Recommendations for Other Services      Precautions / Restrictions Precautions Precautions: Fall Precaution Comments: abdominal pain Restrictions Weight Bearing Restrictions: No       Mobility Bed Mobility Overal bed mobility: Needs Assistance Bed Mobility: Supine to Sit     Supine to sit: Supervision     General bed mobility comments: S for safety, no physical assist given; cues to initiate movement and to sequence scooting hips to EOB  Transfers Overall transfer level: Needs assistance Equipment used: Rolling walker (2 wheeled) Transfers: Sit to/from Stand Sit to Stand: Supervision         General transfer comment: S for  safety, no physical assist given    Balance Overall balance assessment: Needs assistance Sitting-balance support: Feet supported Sitting balance-Leahy Scale: Good Sitting balance - Comments: able to don<>doff shoes from EOB   Standing balance support: Bilateral upper extremity supported Standing balance-Leahy Scale: Poor Standing balance comment: reliance on B UE support                           ADL either performed or assessed with clinical judgement   ADL Overall ADL's : Needs assistance/impaired                     Lower Body Dressing: Supervision/safety;Set up;Sitting/lateral leans Lower Body Dressing Details (indicate cue type and reason): able to don socks from recliner Toilet Transfer: Ambulation;RW;Min guard           Functional mobility during ADLs: Min guard;Rolling walker General ADL Comments: pt complete functional mobillty from EOB>recliner; pt supervision for LB ADL; min guard functional mobility with RW     Vision Patient Visual Report: No change from baseline     Perception     Praxis      Cognition Arousal/Alertness: Awake/alert Behavior During Therapy: WFL for tasks assessed/performed Overall Cognitive Status: No family/caregiver present to determine baseline cognitive functioning Area of Impairment: Problem solving;Following commands;Orientation                 Orientation Level: Disoriented to;Time     Following Commands: Follows one step commands with increased time;Follows one step commands consistently     Problem Solving: Slow processing;Decreased initiation General Comments: Pt slow  to process this session, pt found in dark with windows drawn unaware it was afternoon, probably d/t fact that pt asleep most of the day        Exercises     Shoulder Instructions       General Comments      Pertinent Vitals/ Pain       Faces Pain Scale: Hurts little more Pain Location: back Pain Descriptors / Indicators:  Sore;Grimacing;Discomfort Pain Intervention(s): Limited activity within patient's tolerance;Monitored during session;Repositioned  Home Living                                          Prior Functioning/Environment              Frequency  Min 2X/week        Progress Toward Goals  OT Goals(current goals can now be found in the care plan section)  Progress towards OT goals: Progressing toward goals  Acute Rehab OT Goals Patient Stated Goal: to find out what else can be done to treat his cancer OT Goal Formulation: With patient Time For Goal Achievement: 04/07/19 Potential to Achieve Goals: [place] Discharge plan remains appropriate    Co-evaluation                 AM-PAC OT "6 Clicks" Daily Activity     Outcome Measure   Help from another person eating meals?: None   Help from another person toileting, which includes using toliet, bedpan, or urinal?: A Little Help from another person bathing (including washing, rinsing, drying)?: A Little Help from another person to put on and taking off regular upper body clothing?: None Help from another person to put on and taking off regular lower body clothing?: A Lot 6 Click Score: 16    End of Session Equipment Utilized During Treatment: Rolling walker  OT Visit Diagnosis: Unsteadiness on feet (R26.81);Other abnormalities of gait and mobility (R26.89);Pain;Muscle weakness (generalized) (M62.81)   Activity Tolerance Patient tolerated treatment well   Patient Left in chair;with call bell/phone within reach;with chair alarm set   Nurse Communication Mobility status        Time: 8875-7972 OT Time Calculation (min): 13 min  Charges: OT General Charges $OT Visit: 1 Visit OT Treatments $Self Care/Home Management : 8-22 mins  Tyler, Thornton Murray 03/31/2019, 4:33 PM

## 2019-03-31 NOTE — Progress Notes (Signed)
Daily Progress Note   Patient Name: [name]       Date: 03/31/2019 DOB: [birth year]  Age: 68 y.o. MRN#: [id number] Attending Physician: [name]., * Primary Care Physician: Justin Ebbs, MD Admit Date: 03/15/2019  Reason for Consultation/Follow-up: Establishing goals of care  Subjective: Patient lying in bed awake and watching tv. Complains of mild abdominal discomfort which he states is "pretty much there all the time". Denies shortness of breath. He recalls me being involved in his care last week and states a "few things have changed!" Opportunity allowed for patient to express his initial wishes of wanting to return home with family and have them assist with his care, but unfortunately he doesn't think they will be able to provide the care he needs right away. Therapeutic listening and support provided.   [name] states his children were having a meeting to further discuss what options were available. He again shares that his ex-wife [name] is finishing up radiation treatments and will soon start chemotherapy. He express his sadness of how she has always been there for him and now he is sick and not really able to be there for her. Therapeutic listening provided and also encouraged him to remember what she expressed to him last week and that she knows neither of them planned to be sick with cancer. She shared she wanted him to focus on hisself and not to worry about her and they would promise to support each other mentally through the process. He verbalized remembrance and thankfulness of the reminder.   I had a lengthy and direct discussion with [name] regarding his current functional and nutritional state and his previously expressed goals of hopes to regain some strength and  have a chance to proceed with chemotherapy. I expressed my concerns of his continued weakness and also his poor overall nutritional state. His breakfast tray is sitting on the counter and it appears he drank one of the juices and only ate the meat. Majority of the rest of the tray was left untouched. He verbalized it was not the best prepared breakfast but he felt his appetite had picked up and was planning to do better with future meals.   [name] verbalized his goals remained the same. He is hopeful he can get stronger for chemotherapy, however he  does not or further declines at that point his goals change and he would be more focused on a comfort/hospice approach. He states "being comfortable, happy, with family around, until God calls me home!" He confirms DNR/DNI. He also verbalizes his satisfaction with not requiring HD at this time. He states "I am willing to take dialysis, but glad that I don't right now!" Support given.   I discussed with him the recommendations that he will require 24/7 assistance if he was to return home and without a definitive set plan with family involvement we could not safely discharge him. He verbalized understanding and expressed "although it is not what I want, I may have to go to a facility to see how I will do and just take it from there!" Support given. He states his children have also discussed options and he knows they have a family to care for and can not just drop everything for him. I noticed that a note on his whiteboard was listed to contact his daughter [name] first for updates. I inquired about the note as he informed me last week to contact his son, [name] first for all needs. [name] states he would like me to contact [name] or his ex-wife [name] and provide any follow ups. He is aware that I am going to call [name] and touch base with him and see if family has further discussed home needs and answer any questions. [name] verbalized understanding and again  gave verbal permission to contact [name].   I was able to call and speak with [name] who was also with speaking with [name] (patient's other daughter). I provided updates and answered all questions to the best of my ability. Family verbalized similar goals as expressed by patient. [name] was sad as he stated he and his siblings have discussed and unfortunately neither of the 4 are able to make adjustments to their work schedules or family obligations to provide 24/7 assistance at this time. [name] shared that he would potentially be able to take a leave later if his dads condition changed but he was not able to do so at this time given he is not hospice and the goal is for him to hopefully regain some strength and be considered for chemotherapy. Family expressed and was able to summarize their awareness of their father's condition. They expressed their goals are the same as his, give him an opportunity to show signs of improvement, rehab, and if he declines or unable to improve to undergo chemo treatments then they would be supportive of a more hospice approach and their goal would then be to make sure he is comfortable and not suffering. I educated family that patient has quite a ways to go to get to this state and unfortunately this means he could further decline during through process of waiting. They verbalized understanding.   Family is requesting further discussion on available options for SNF at discharge to again allow [name] an opportunity to participate and improve. [name], states they are also pre-planning in the event he does not do well and what their "next steps" would be when this happens. I dicussed palliative and hospice with family and they verbalized understanding.   At this time patient and his children's goals remain clear. They are requesting SNF placement for rehab with hopes he will show signs of an improved functional and nutritional state. If [name] further declines or  does not get to a point he is a candidate for chemotherapy (  which they are aware is highly unlikely) they will then transition to more comfort/hospice approach for EOL.     Length of Stay: 16  Current Medications: Scheduled Meds:   amLODipine  5 mg Oral Daily   atorvastatin  20 mg Oral Daily   carvedilol  25 mg Oral BID WC   Chlorhexidine Gluconate Cloth  6 each Topical Daily   feeding supplement  1 Container Oral TID BM   folic acid  1 mg Oral Daily   gabapentin  300 mg Oral Daily   insulin aspart  0-9 Units Subcutaneous TID WC   multivitamin with minerals  1 tablet Oral Daily   mupirocin ointment  1 application Topical BID   nystatin  5 mL Oral QID   pantoprazole  40 mg Oral Daily   sodium chloride flush  10-40 mL Intracatheter Q12H    Continuous Infusions:  sodium chloride 10 mL/hr at 03/21/19 1430    PRN Meds: acetaminophen **OR** acetaminophen, albuterol, calcium carbonate (dosed in mg elemental calcium), camphor-menthol **AND** hydrOXYzine, docusate sodium, HYDROmorphone (DILAUDID) injection, lidocaine, menthol-cetylpyridinium, ondansetron **OR** ondansetron (ZOFRAN) IV, oxyCODONE **OR** oxyCODONE, sodium chloride flush, sorbitol, zolpidem  Physical Exam         -awake, A&O x3, NAD, room air  -diminished bilaterally -RRR -mood appropriate, moves all extremities    Vital Signs: BP (!) 147/76 (BP Location: Right Arm)    Pulse 73    Temp 99.4 F (37.4 C) (Oral)    Resp 16    Ht 6' 1" (1.854 m)    Wt 64 kg    SpO2 94%    BMI 18.62 kg/m  SpO2: SpO2: 94 % O2 Device: O2 Device: Room Air O2 Flow Rate: O2 Flow Rate (L/min): 2 L/min  Intake/output summary:  No intake or output data in the 24 hours ending 03/31/19 1111 LBM: Last BM Date: 03/29/19 Baseline Weight: Weight: 64 kg Most recent weight: Weight: 64 kg       Palliative Assessment/Data:    Flowsheet Rows     Most Recent Value  Intake Tab  Referral Department  Hospitalist  Unit at Time of  Referral  Med/Surg Unit  Palliative Care Primary Diagnosis  Cancer  Date Notified  03/18/19  Palliative Care Type  New Palliative care  Reason for referral  Clarify Goals of Care  Date of Admission  03/15/19  Date first seen by Palliative Care  03/19/19  # of days Palliative referral response time  1 Day(s)  # of days IP prior to Palliative referral  3  Clinical Assessment  Psychosocial & Spiritual Assessment  Palliative Care Outcomes      Patient Active Problem List   Diagnosis Date Noted   Malnutrition of moderate degree 03/29/2019   Thrombocytosis (HCC)    Goals of care, counseling/discussion    Malignant neoplasm of cecum (HCC)    Colon cancer ([place]) 03/15/2019   Anemia 03/15/2019   Congestive heart failure (CHF) ([place]) 01/17/2019   Acute on chronic diastolic CHF (congestive heart failure) ([place]) 01/16/2019   Hyponatremia 01/16/2019   Sepsis ([place]) 02/08/2018   Opiate overdose ([place]) 11/13/2017   Acute urinary obstruction 03/29/2017   Symptomatic anemia    Hb-SS disease with crisis ([place]) 03/27/2017   Sickle cell pain crisis ([place]) 01/23/2017   Heart murmur 03/27/2016   CKD (chronic kidney disease) stage 5, GFR less than 15 ml/min (HCC) 03/26/2016   Cocaine abuse ([place]) 03/25/2016   Renal failure (ARF), acute on chronic (HCC)  Hb-SS disease without crisis ([place])    Hyperkalemia, diminished renal excretion 09/03/2014   Leukocytosis 07/28/2011   LBBB (left bundle branch block) 07/28/2011   Hypertension    Type 2 diabetes mellitus with renal complication [facility])     Palliative Care Assessment & Plan   Recommendations/Plan:  DNR/DNI  Continue with current plan of care per medical team  Patient and family requesting consideration for SNF w/rehab. Family have met and discussed and no one is available to care for patient 24/7 due to work/family obligations at this time.    Patient/Family remains hopeful for improvement (strength/nutrition) and  possibly be able to have chemo, however they are also prepared for the worst (further decline/unable to pursue chemo) at this point they are open and prepared for a more hospice/comfort approach.   PMT will continue to support and follow.   Goals of Care and Additional Recommendations:  Limitations on Scope of Treatment: Full Scope Treatment  Code Status:    Code Status Orders  (From admission, onward)         Start     Ordered   03/15/19 1512  Do not attempt resuscitation (DNR)  Continuous    Question Answer Comment  In the event of cardiac or respiratory ARREST Do not call a code blue   In the event of cardiac or respiratory ARREST Do not perform Intubation, CPR, defibrillation or ACLS   In the event of cardiac or respiratory ARREST Use medication by any route, position, wound care, and other measures to relive pain and suffering. May use oxygen, suction and manual treatment of airway obstruction as needed for comfort.      03/15/19 1512        Code Status History    Date Active Date Inactive Code Status Order ID Comments User Context   01/16/2019 2209 01/18/2019 2019 Full Code [id number]  Vianne Bulls, MD Inpatient   Prognosis:  Guarded-Poor   Discharge Planning:  To Be Determined patient and family now requesting SNF w/rehab and OP Palliative.   Care plan was discussed with patient, patient's children, Dr. Florene Glen, and [place], Patrick.   Thank you for allowing the Palliative Medicine Team to assist in the care of this patient.  Total Time: 50 min.   Greater than 50%  of this time was spent counseling and coordinating care related to the above assessment and plan.  Alda Lea, AGPCNP-BC Palliative Medicine Team  Phone: [phone] Pager: [phone] Amion: Bjorn Pippin    Please contact Palliative Medicine Team phone at [phone] for questions and concerns.

## 2019-03-31 NOTE — Progress Notes (Signed)
PT Cancellation Note  Patient Details Name: TAISHAUN LEVELS MRN: [id number] DOB: Aug 11, 1950   Cancelled Treatment:    Reason Eval/Treat Not Completed: Other (comment) patient sleeping very soundly, unable to wake. Will attempt to return later if time/schedule allow.    Deniece Ree PT, DPT, CBIS  Supplemental Physical Therapist [facility]    Pager [phone] Acute Rehab Office [phone]

## 2019-03-31 NOTE — TOC Progression Note (Signed)
Transition of Care [facility]) - Progression Note    Patient Details  Name: [name] MRN: [id number] Date of Birth: 05-13-1951  Transition of Care [facility]) CM/SW Contact  Jacalyn Lefevre Edson Snowball, RN Phone Number: 03/31/2019, 3:55 PM  Clinical Narrative:     Confirmed with patient at bedside he does now want SNF at discharge. Patient gave permission for hospital staff to call his daughter Tollie Pizza [phone]   Expected Discharge Plan: [place] Barriers to Discharge: Continued Medical Work up  Expected Discharge Plan and Services Expected Discharge Plan: [place] In-house Referral: Clinical Social Work Discharge Planning Services: CM Consult Post Acute Care Choice: [place] arrangements for the past 2 months: Single Family Home                 DME Arranged: N/A         HH Arranged: Patient Refused [place]           Social Determinants of Health (SDOH) Interventions    Readmission Risk Interventions Readmission Risk Prevention Plan 03/30/2019 03/30/2019 03/19/2019  Transportation Screening Complete Complete Complete  PCP or Specialist Appt within 3-5 Days - - -  HRI or [place] Work Consult for [place] Planning/Counseling - - -  [place] - - -  Medication Review Press photographer) Complete Complete Complete  PCP or Specialist appointment within 3-5 days of discharge Complete Complete Complete  HRI or Home Care Consult Complete Complete Complete  SW Recovery Care/Counseling Consult Complete Complete Complete  Palliative Care Screening Complete Complete Not Applicable  Skilled Nursing Facility Complete Complete Complete  Some recent data might be hidden

## 2019-03-31 NOTE — Progress Notes (Signed)
PROGRESS NOTE    [name]  OEV:[id number] DOB: 06-29-1950 DOA: 03/15/2019 PCP: Nolene Ebbs, MD   Brief Narrative:  [name] a 68 y.o.malewith medical history significant ofsickle cell anemia (SS disease); ESRDnoton HD; HTN; HLD; DM; and colon CA presenting with n/v. He had a vocal cord lesion removed with CO2 laser excision during microlaryngoscopy on 10/7. He went home and then started vomiting the evening of 10.7. No vomiting prior to surgery. It would come and go. He has been losing a lot of weight and so has been drinking Boost. Unintentional weight loss of about 50 pounds in the last 4-5 months. He started having night sweats recently. No LAD. Slight diarrhea after the surgery, thought it was related to the anesthesia. Normal BMs other than post-operatively. Stools have been black because he has been taking iron. He got 4 units of blood a week or two before the surgery, that increased his Hgb from 2.5 up to 8.9. He has not yet started HD, still making urine.  His ex-wife has been caring for him for the last 3 weeks. His colon cancer was 20 years ago. He has never had a colonoscopy in over 25 years.  He was last admitted 8/13-15 for symptomatic anemia from CKD and given IV iron and Epogen.  Upon arrival to ED, patient had c/o abdominal pain and vomiting. Had laryngeal biopsy a few days ago. WBC 40k. CT read as progression of colon CA at cecum and TI with ?typhlitis.  Patient was admitted under hospitalist service with GI and surgery consultation.  Patient underwent colonoscopy on 03/18/2019 which showed cecum and ascending colon mass which was biopsied.  Patient's hemoglobin dropped from 7.026.5 on 03/15/2019 and he received 1 unit of PRBC transfusion but then again it dropped to 6.5 so he received second unit of transfusion on 03/17/2019.  General surgery consulted cardiology and patient was cleared for surgery from cardiac standpoint.  Palliative  medicine was also consulted per general surgery recommendations.  Patient decided to proceed with surgery.  Nephrology consulted vascular surgery for AV graft which was placed on 03/20/2019.  Patient then underwent exploratory laparotomy by general surgery on 03/21/2019 however his mass was unresectable.  Patient then expressed wishes to see oncology for consideration of radiation and chemotherapy.  Dr. [name] from oncology saw patient and he recommends against chemotherapy at this point in time due to several comorbidities that patient has.  Palliative care was consulted.  Assessment & Plan:   Principal Problem:   Colon cancer ([place]) Active Problems:   Hypertension   Type 2 diabetes mellitus with renal complication (HCC)   CKD (chronic kidney disease) stage 5, GFR less than 15 ml/min (HCC)   Anemia   Malignant neoplasm of cecum (HCC)   Goals of care, counseling/discussion   Thrombocytosis (HCC)   Malnutrition of moderate degree   Colon cancer. Patient with remote h/o colon cancer (records not apparently available) without subsequent follow-up, ex-wife reports no colonoscopy in 25 years -Presented with RLQ abdominal pain, and n/v; also with 50 pounds unintentional weight loss in the last 4-5 months and recent night sweats -Imaging indicates recurrent colon cancer in the cecum/TI. This appears to be at least stage 3 with positive LN, No obstruction on CT. CEA level-elevated.  Patient underwent colonoscopy on 03/18/2019 which showed cecum and ascending colon mass which was biopsied.  Patient has now decided to proceed with surgery.  Cardiology cleared the patient for surgery from cardiac standpoint stating he will be  high risk candidate due to other medical problems.  Patient was going to have palliative right hemicolectomy and underwent exploratory laparotomy by general surgery on 03/21/2019 however his mass was unresectable so he was closed back.  Oncology was consulted.  Seen by Dr. [name] who noted  pt not candidate for systemic therapy until significant clinical improvement.  Recommend CT chest without contrast when able to complete staging - with new L pleural effusion, atelectasis at R lung base.  Sequelae of sickle cell disease. Appreciate palliative care assistance.  Will continue to discuss with pt and family as well as palliative care/oncology to help clarify plans for dispo (difficult at this time, pt lives alone, therapy recommending 24 hr assistance, he would be unable to go home alone at this time - discussed with son who note family is discussing plan amongst themselves, possible that he may go home with ex wife, but she has significant health issues as well - discussed with ex wife 10/24 who notes she's undergoing radiation now and will start chemotherapy this week - she's going to speak to children to see if they can arrange another plan).  I've asked palliative care to see if they can help with d/c planning as well.  Therapy currently recommending 24 hr supervision/assistance - not currently safe for discharge as this has not been arranged - looking into SNF at this point - appreciate palliative care assistance.  Transition to oral pain medications.  Patient remains hopeful that he will be strong enough 1 day to be able to start chemotherapy and HD.  As of now, he is wanting aggressive care but wants to remain DNR.  Left Pleural Effusion: ?malignant with above.  He's doing well from resp standpoint at this time.  Afebrile.  White count is stable.   Will continue to monitor for now, consider thoracentesis if worsening  Stage 5 CKD -Patient has been preparing for HD but has not yet started. Nephrology prn order set utilized.  Seen by nephrology and they consulted vascular surgery for temporary HD and fistula in anticipation for hemodialysis.  Patient underwent left upper arm AV fistula placed on 03/20/2019.  Creatinine improving.  Nephrology now signed off.   Metabolic Alkalosis: 2/2  bicarb, will discontinue  Acute blood loss anemia on chronic anemia secondary to colon cancer/lower GI bleed vs sickle cell anemia: -Likely multifactorial including h/o sickle cell disease and stage 5 CKD but most likely due to his recurrence of colon cancer.  Received 1 unit of PRBC on 03/16/2019 and 1 unit again on 03/17/2019.   - stable and follow  Leukocytosis: afebrile, will continue to monitor  HTN -Blood pressure controlled.  Continue Norvasc, Coreg  Chronic diastolic CHF -15/1761 echo with preserved EF and grade 1 diastolic dysfunction -Despite stage 5 CKD, he does not appear to be volume overloaded at this time. -If he becomes volume overloaded, the likely treatment would be HD.  DM -Hemoglobin A1c 6.9 on 03/15/2019.  HoldOnglyza.  Blood sugar reasonable.  Continue SSI.  Follow.  HLD  -Continue Lipitor  Cocaine abuse -Patient denies use since July, but was positive at the time of last admission in August. -UDS negative this time.  Postoperative ileus: Doing well.  Abdominal pain improving.  BM charted from 10/22.  RIJ central line - discontinued on 10/23  DVT prophylaxis: SCD Code Status: DNR Family Communication: none at bedside - discussed with ex wife on 10/24 - discussed with daughter becky on 10/24 Disposition Plan: pending further [place] discussions -  no safe discharge plan at this time, needs 24 hr supervision, but currently no plan for him to go home where he'd have enough support at home.     Consultants:   Palliative  Surgery  GI  Cardiology  Vascular  Oncology  Procedures:   Colonoscopy 10/12  AV graft on 10/15  Antimicrobials:  Anti-infectives (From admission, onward)   Start     Dose/Rate Route Frequency Ordered Stop   03/21/19 1545  cefoTEtan (CEFOTAN) 2 g in sodium chloride 0.9 % 100 mL IVPB     2 g 200 mL/hr over 30 Minutes Intravenous To Surgery 03/21/19 1542 03/21/19 1603   03/21/19 1515  piperacillin-tazobactam (ZOSYN)  IVPB 2.25 g     2.25 g 100 mL/hr over 30 Minutes Intravenous To Surgery 03/21/19 1504 03/21/19 1532   03/16/19 0000  piperacillin-tazobactam (ZOSYN) IVPB 2.25 g     2.25 g 100 mL/hr over 30 Minutes Intravenous Every 8 hours 03/15/19 1527 03/25/19 2359   03/15/19 1430  piperacillin-tazobactam (ZOSYN) IVPB 3.375 g     3.375 g 12.5 mL/hr over 240 Minutes Intravenous  Once 03/15/19 1423 03/15/19 1915     Subjective: Says planning for meeting today. No other concerns.  Objective: Vitals:   03/30/19 1444 03/30/19 2126 03/31/19 0609 03/31/19 1355  BP: 122/70 127/66 (!) 147/76 125/60  Pulse: 75 77 73 74  Resp: 20 18 16 18   Temp: 98.7 F (37.1 C) 98.8 F (37.1 C) 99.4 F (37.4 C) 97.9 F (36.6 C)  TempSrc: Oral Oral Oral Oral  SpO2: 97% 91% 94% 91%  Weight:      Height:        Intake/Output Summary (Last 24 hours) at 03/31/2019 1644 Last data filed at 03/31/2019 1357 Gross per 24 hour  Intake 250 ml  Output 300 ml  Net -50 ml   Filed Weights   03/18/19 0800 03/21/19 1327  Weight: 64 kg 64 kg    Examination:  General: No acute distress. Cardiovascular: Heart sounds show a regular rate, and rhythm.  Lungs: Clear to auscultation bilaterally  Abdomen: Soft, nontender, nondistended.  Mild dehiscence to abdominal wound.  Neurological: Alert and oriented 3. Moves all extremities 4. Cranial nerves II through XII grossly intact. Skin: Warm and dry. No rashes or lesions. Extremities: No clubbing or cyanosis. No edema.    Data Reviewed: I have personally reviewed following labs and imaging studies  CBC: Recent Labs  Lab 03/25/19 0407 03/26/19 0327  03/27/19 0410 03/28/19 0501 03/29/19 1141 03/30/19 0705 03/31/19 0225  WBC 25.9* 27.8*  --  22.3* 22.6* 22.1* 22.2* 24.1*  NEUTROABS 19.5* 21.3*  --   --   --   --   --   --   HGB 9.1* 8.2*   < > 8.3* 8.1* 8.9* 8.1* 8.7*  HCT 28.1* 26.0*   < > 26.1* 25.2* 27.5* 25.1* 26.6*  MCV 80.5 80.2  --  80.1 79.7* 78.6* 80.2 79.4*   PLT 442* 411*  --  450* 445* 500* 449* 502*   < > = values in this interval not displayed.   Basic Metabolic Panel: Recent Labs  Lab 03/25/19 0407 03/26/19 0327 03/27/19 0410 03/28/19 0501 03/29/19 1141 03/30/19 0705 03/31/19 0225  NA 139 139 138 140 140 136 137  K 4.5 3.8 3.4* 3.5 3.5 3.8 3.5  CL 109 105 99 97* 93* 86* 86*  CO2 19* 23 27 32 34* 37* 39*  GLUCOSE 133* 148* 143* 160* 108* 184* 157*  BUN  65* 61* 49* 39* 27* 23 23  CREATININE 4.64* 4.43* 3.51* 2.93* 2.73* 2.85* 3.07*  CALCIUM 9.3 8.7* 8.5* 8.5* 8.8* 8.5* 8.6*  MG  --   --  2.1 2.1  --  1.9 2.0  PHOS 4.2 3.3 3.4 3.3 3.2  --   --    GFR: Estimated Creatinine Clearance: 20.3 mL/min (A) (by C-G formula based on SCr of 3.07 mg/dL (H)). Liver Function Tests: Recent Labs  Lab 03/27/19 0410 03/28/19 0501 03/29/19 1141 03/30/19 0705 03/31/19 0225  AST  --   --  16 16 18   ALT  --   --  8 7 8   ALKPHOS  --   --  77 77 76  BILITOT  --   --  0.5 1.0 0.5  PROT  --   --  6.3* 5.9* 6.0*  ALBUMIN 1.9* 1.8* 2.0* 1.9* 1.9*   No results for input(s): LIPASE, AMYLASE in the last 168 hours. No results for input(s): AMMONIA in the last 168 hours. Coagulation Profile: No results for input(s): INR, PROTIME in the last 168 hours. Cardiac Enzymes: No results for input(s): CKTOTAL, CKMB, CKMBINDEX, TROPONINI in the last 168 hours. BNP (last 3 results) No results for input(s): PROBNP in the last 8760 hours. HbA1C: No results for input(s): HGBA1C in the last 72 hours. CBG: Recent Labs  Lab 03/30/19 1231 03/30/19 1807 03/30/19 2128 03/31/19 0826 03/31/19 1207  GLUCAP 129* 129* 190* 159* 166*   Lipid Profile: No results for input(s): CHOL, HDL, LDLCALC, TRIG, CHOLHDL, LDLDIRECT in the last 72 hours. Thyroid Function Tests: No results for input(s): TSH, T4TOTAL, FREET4, T3FREE, THYROIDAB in the last 72 hours. Anemia Panel: No results for input(s): VITAMINB12, FOLATE, FERRITIN, TIBC, IRON, RETICCTPCT in the last 72  hours. Sepsis Labs: No results for input(s): PROCALCITON, LATICACIDVEN in the last 168 hours.  No results found for this or any previous visit (from the past 240 hour(s)).       Radiology Studies: No results found.      Scheduled Meds: . amLODipine  5 mg Oral Daily  . atorvastatin  20 mg Oral Daily  . carvedilol  25 mg Oral BID WC  . Chlorhexidine Gluconate Cloth  6 each Topical Daily  . feeding supplement  1 Container Oral TID BM  . folic acid  1 mg Oral Daily  . gabapentin  300 mg Oral Daily  . insulin aspart  0-9 Units Subcutaneous TID WC  . multivitamin with minerals  1 tablet Oral Daily  . mupirocin ointment  1 application Topical BID  . nystatin  5 mL Oral QID  . pantoprazole  40 mg Oral Daily  . sodium chloride flush  10-40 mL Intracatheter Q12H   Continuous Infusions: . sodium chloride 10 mL/hr at 03/21/19 1430     LOS: 16 days    Time spent: over 30 min    Fayrene Helper, MD [facility] Pager AMION  If 7PM-7AM, please contact night-coverage [email or web address] Password [email or web address] 03/31/2019, 4:44 PM

## 2019-04-01 ENCOUNTER — Inpatient Hospital Stay: Payer: Self-pay

## 2019-04-01 DIAGNOSIS — E440 Moderate protein-calorie malnutrition: Secondary | ICD-10-CM

## 2019-04-01 DIAGNOSIS — N185 Chronic kidney disease, stage 5: Secondary | ICD-10-CM | POA: Diagnosis not present

## 2019-04-01 DIAGNOSIS — C182 Malignant neoplasm of ascending colon: Secondary | ICD-10-CM | POA: Diagnosis not present

## 2019-04-01 DIAGNOSIS — N186 End stage renal disease: Secondary | ICD-10-CM | POA: Diagnosis not present

## 2019-04-01 DIAGNOSIS — Z7189 Other specified counseling: Secondary | ICD-10-CM | POA: Diagnosis not present

## 2019-04-01 LAB — CBC
HCT: 25.1 % — ABNORMAL LOW (ref 39.0–52.0)
Hemoglobin: 8.2 g/dL — ABNORMAL LOW (ref 13.0–17.0)
MCH: 25.8 pg — ABNORMAL LOW (ref 26.0–34.0)
MCHC: 32.7 g/dL (ref 30.0–36.0)
MCV: 78.9 fL — ABNORMAL LOW (ref 80.0–100.0)
Platelets: 484 10*3/uL — ABNORMAL HIGH (ref 150–400)
RBC: 3.18 MIL/uL — ABNORMAL LOW (ref 4.22–5.81)
RDW: 22 % — ABNORMAL HIGH (ref 11.5–15.5)
WBC: 25.9 10*3/uL — ABNORMAL HIGH (ref 4.0–10.5)
nRBC: 0.2 % (ref 0.0–0.2)

## 2019-04-01 LAB — GLUCOSE, CAPILLARY
Glucose-Capillary: 203 mg/dL — ABNORMAL HIGH (ref 70–99)
Glucose-Capillary: 168 mg/dL — ABNORMAL HIGH (ref 70–99)
Glucose-Capillary: 57 mg/dL — ABNORMAL LOW (ref 70–99)
Glucose-Capillary: 61 mg/dL — ABNORMAL LOW (ref 70–99)
Glucose-Capillary: 86 mg/dL (ref 70–99)
Glucose-Capillary: 200 mg/dL — ABNORMAL HIGH (ref 70–99)

## 2019-04-01 LAB — COMPREHENSIVE METABOLIC PANEL
ALT: 7 U/L (ref 0–44)
AST: 18 U/L (ref 15–41)
Albumin: 1.9 g/dL — ABNORMAL LOW (ref 3.5–5.0)
Alkaline Phosphatase: 78 U/L (ref 38–126)
Anion gap: 12 (ref 5–15)
BUN: 22 mg/dL (ref 8–23)
CO2: 36 mmol/L — ABNORMAL HIGH (ref 22–32)
Calcium: 8.7 mg/dL — ABNORMAL LOW (ref 8.9–10.3)
Chloride: 88 mmol/L — ABNORMAL LOW (ref 98–111)
Creatinine, Ser: 3.22 mg/dL — ABNORMAL HIGH (ref 0.61–1.24)
GFR calc Af Amer: 21 mL/min — ABNORMAL LOW (ref >60)
GFR calc non Af Amer: 18 mL/min — ABNORMAL LOW (ref >60)
Glucose, Bld: 203 mg/dL — ABNORMAL HIGH (ref 70–99)
Potassium: 4.2 mmol/L (ref 3.5–5.1)
Sodium: 136 mmol/L (ref 135–145)
Total Bilirubin: 0.6 mg/dL (ref 0.3–1.2)
Total Protein: 5.7 g/dL — ABNORMAL LOW (ref 6.5–8.1)

## 2019-04-01 LAB — MAGNESIUM: Magnesium: 2 mg/dL (ref 1.7–2.4)

## 2019-04-01 MED ORDER — DEXTROSE-NACL 5-0.45 % IV SOLN
INTRAVENOUS | Status: DC
  Administered 2019-04-01: 22:00:00 via INTRAVENOUS

## 2019-04-01 NOTE — Progress Notes (Signed)
PALLIATIVE NOTE:  Patient observed ambulating (walker assisted) in the hall with PT. Now sitting up in recliner. Denies pain or shortness of breath. States he is tired from walking and now realizes how weak he really is. His breakfast tray is at the bedside, he has eaten about 50% and requesting to place in front of him to try to eat more. No family at the bedside.   I made him aware that I spoke with [name] on yesterday who was also on the phone with [name] and included her in the discussion. He verbalized understanding and appreciation. States they let him know that we had spoken. Mr. Karim states "we are all on the same page. Go to a nursing home see how I do and take it from there!" He ask if I will be seeing him at the facility. I explained to him that I work inpatient only but we would make sure to refer him to outpatient palliative for continued support. He verbalized understanding and states "that's the question my family had was if you would be my provider. We want to make sure if things don't go well someone knows what the plans are and how to help [place] get through it!" I verbalized understanding and also shared that outpatient palliative would be in support along with the facility would also have a social worker that can be of assistance. He verbalized understanding.   I inquired with him again about speaking with [name] and providing updates and answering questions. He verbalized he was in agreement as she has been asking. We called her at the bedside. Updates provided. [name] states she would like to have a family meeting and have further discussions regarding her father. I offered to meet with family today or tomorrow as discussed yesterday with [name]. [name] states their main concern is about getting her father to SNF given no one is available to provide 24/7 care and because he is so deconditioned. She also verbalized similar goals as previously expressed by patient and [name]. [name] states she  will reach out to her siblings but think they could all be available by phone for sure tomorrow. She plans to get back in touch with me desired time.   All questions answered.   -awake, A&O x3, NAD -RRR -normal breathing pattern -mood appropriate  Plan -Will await to hear back from [place] or [place] for designated time for family meeting. Family seems to want to discuss more about disposition and support at discharge. Hopefully this will take place tomorrow.  -Goals remain set and clear in regards to care by both patient and his children. Continue current plan of care, hopeful for improvement with possibility of receiving chemotherapy, however, if no improvement or further decline goals will change to a more comfort/hospice approach  Total Time:40 min.   Greater than 50%  of this time was spent counseling and coordinating care related to the above assessment and plan.  Alda Lea, AGPCNP-BC Palliative Medicine Team  Phone: [phone] Pager: [phone] Amion: Bjorn Pippin

## 2019-04-01 NOTE — TOC Progression Note (Addendum)
Transition of Care [facility]) - Progression Note    Patient Details  Name: [name] MRN: [id number] Date of Birth: [birth year]  Transition of Care [facility]) CM/SW [place], [place] Phone Number: 04/01/2019, 10:24 AM  Clinical Narrative:    Pt currently without any SNF offers at this time. Insurance company has requested additional clinicals as pt at supervision level for current care. Faxed PT/OT evals from 10/26 and 10/27 along with MD progress note to [facility] at [phone].   Expected Discharge Plan: Skilled Nursing Facility Barriers to Discharge: Continued Medical Work up  Expected Discharge Plan and Services Expected Discharge Plan: [place] In-house Referral: Clinical Social Work Discharge Planning Services: CM Consult Post Acute Care Choice: [place] arrangements for the past 2 months: Single Family Home           DME Arranged: N/A HH Arranged: Patient Refused HH   Social Determinants of Health (SDOH) Interventions    Readmission Risk Interventions Readmission Risk Prevention Plan 03/30/2019 03/30/2019 03/19/2019  Transportation Screening Complete Complete Complete  PCP or Specialist Appt within 3-5 Days - - -  HRI or [place] for [place] Planning/Counseling - - -  [place] - - -  Medication Review Press photographer) Complete Complete Complete  PCP or Specialist appointment within 3-5 days of discharge Complete Complete Complete  HRI or Home Care Consult Complete Complete Complete  SW Recovery Care/Counseling Consult Complete Complete Complete  Palliative Care Screening Complete Complete Not Applicable  Skilled Nursing Facility Complete Complete Complete  Some recent data might be hidden

## 2019-04-01 NOTE — Progress Notes (Signed)
Nutrition Follow-up  DOCUMENTATION CODES:   Non-severe (moderate) malnutrition in context of chronic illness  INTERVENTION:   -Continue MVI with minerals daily -Continue Boost Breeze po TID, each supplement provides 250 kcal and 9 grams of protein -Continue Magic cup TID with meals, each supplement provides 290 kcal and 9 grams of protein  NUTRITION DIAGNOSIS:   Moderate Malnutrition related to chronic illness(colon cancer) as evidenced by mild fat depletion, moderate fat depletion, mild muscle depletion, moderate muscle depletion.  Ongoing  GOAL:   Patient will meet greater than or equal to 90% of their needs  Progressing   MONITOR:   Supplement acceptance, PO intake, Labs, Weight trends, Skin, I & O's  REASON FOR ASSESSMENT:   Malnutrition Screening Tool    ASSESSMENT:   [name] is a 68 y.o. male with medical history significant of sickle cell anemia (SS disease); ESRD not on HD; HTN; HLD; DM; and colon CA presenting with n/v.  He had a vocal cord lesion removed with CO2 laser excision during microlaryngoscopy on 10/7.  He went home and then started vomiting the evening of 10.7.  No vomiting prior to surgery.  It would come and go.  He has been losing a lot of weight and so has been drinking Boost.  Unintentional weight loss of about 50 pounds in the last 4-5 months.  He started having night sweats recently.  No LAD.  Slight diarrhea after the surgery, thought it was related to the anesthesia.  Normal BMs other than post-operatively.  Stools have been black because he has been taking iron.  He got 4 units of blood a week or two before the surgery, that increased his Hgb from 2.5 up to 8.9.  He has not yet started HD, still making urine.  10/13- s/p colonoscopy 10/14- pathology on colon mass reveals poorly differentiated carcinoma 10/15- s/pPROCEDURE:Left upper arm AV Gore-Tex graft 10/16- s/p ex lap, colon mass unresectable 10/18- NGT inserted due to emesis,  removed   Reviewed I/O's: -200 ml x 24 hours and +8 L since 03/18/19  UOP: 450 ml x 24 hours  Pt resting quietly at time of visit. RD did not disturb.   Pt's intake steadily imprivng; noted meal completion 25-50%. Pt is taking Boost Breeze supplements per MAR. Pt with poor tolerance of Ensure Enlive (causes diarrhea).   Palliative care following; pt with clear goals of care- he would like to pursue all available treatment options for now, however, will elect for comfort care if he declines. Pt is currently not an HD or chemotherapy candidate due to poor functional status. Pt noe desires SNF placement; CSW working on placement.   Labs reviewed: CBGS: 77-203 (inpatient orders for glycemic control are 0-9 units insulin aspart TID with meals).   Diet Order:   Diet Order            DIET SOFT Room service appropriate? Yes with Assist; Fluid consistency: Thin  Diet effective now              EDUCATION NEEDS:   Education needs have been addressed  Skin:  Skin Assessment: Skin Integrity Issues: Skin Integrity Issues:: Incisions Incisions: close lt arm, abdomen  Last BM:  03/29/19  Height:   Ht Readings from Last 1 Encounters:  03/21/19 6\' 1"  (1.854 m)    Weight:   Wt Readings from Last 1 Encounters:  03/21/19 64 kg    Ideal Body Weight:  83.6 kg  BMI:  Body mass index is 18.62  kg/m.  Estimated Nutritional Needs:   Kcal:  2050-2250  Protein:  110-125 grams  Fluid:  2-2.2 L    Nickayla Mcinnis A. Jimmye Norman, RD, LDN, [place] Registered Dietitian II Certified Diabetes Care and Education Specialist Pager: [phone] After hours Pager: [phone]

## 2019-04-01 NOTE — Progress Notes (Signed)
PROGRESS NOTE    [name]  GQQ:[id number] DOB: 1951-05-17 DOA: 03/15/2019 PCP: Nolene Ebbs, MD   Brief Narrative:  [name] 68 y.o.malewith medical history significant ofsickle cell anemia (SS disease); ESRDnoton HD; HTN; HLD; DM; and colon CA presenting with n/v. He had Sadrac Zeoli vocal cord lesion removed with CO2 laser excision during microlaryngoscopy on 10/7. He went home and then started vomiting the evening of 10.7. No vomiting prior to surgery. It would come and go. He has been losing Jayzon Taras lot of weight and so has been drinking Boost. Unintentional weight loss of about 50 pounds in the last 4-5 months. He started having night sweats recently. No LAD. Slight diarrhea after the surgery, thought it was related to the anesthesia. Normal BMs other than post-operatively. Stools have been black because he has been taking iron. He got 4 units of blood Twinkle Sockwell week or two before the surgery, that increased his Hgb from 2.5 up to 8.9. He has not yet started HD, still making urine.  His ex-wife has been caring for him for the last 3 weeks. His colon cancer was 20 years ago. He has never had Gailya Tauer colonoscopy in over 25 years.  He was last admitted 8/13-15 for symptomatic anemia from CKD and given IV iron and Epogen.  Upon arrival to ED, patient had c/o abdominal pain and vomiting. Had laryngeal biopsy Milliani Herrada few days ago. WBC 40k. CT read as progression of colon CA at cecum and TI with ?typhlitis.  Patient was admitted under hospitalist service with GI and surgery consultation.  Patient underwent colonoscopy on 03/18/2019 which showed cecum and ascending colon mass which was biopsied.  Patient's hemoglobin dropped from 7.026.5 on 03/15/2019 and he received 1 unit of PRBC transfusion but then again it dropped to 6.5 so he received second unit of transfusion on 03/17/2019.  General surgery consulted cardiology and patient was cleared for surgery from cardiac standpoint.  Palliative  medicine was also consulted per general surgery recommendations.  Patient decided to proceed with surgery.  Nephrology consulted vascular surgery for AV graft which was placed on 03/20/2019.  Patient then underwent exploratory laparotomy by general surgery on 03/21/2019 however his mass was unresectable.  Patient then expressed wishes to see oncology for consideration of radiation and chemotherapy.  Dr. [name] from oncology saw patient and he recommends against chemotherapy at this point in time due to several comorbidities that patient has.  Palliative care was consulted.  Assessment & Plan:   Principal Problem:   Colon cancer ([place]) Active Problems:   Hypertension   Type 2 diabetes mellitus with renal complication (HCC)   CKD (chronic kidney disease) stage 5, GFR less than 15 ml/min (HCC)   Anemia   Malignant neoplasm of cecum (HCC)   Goals of care, counseling/discussion   Thrombocytosis (HCC)   Malnutrition of moderate degree   Colon cancer. Patient with remote h/o colon cancer (records not apparently available) without subsequent follow-up, ex-wife reports no colonoscopy in 25 years -Presented with RLQ abdominal pain, and n/v; also with 50 pounds unintentional weight loss in the last 4-5 months and recent night sweats -Imaging indicates recurrent colon cancer in the cecum/TI. This appears to be at least stage 3 with positive LN, No obstruction on CT. CEA level-elevated.  Patient underwent colonoscopy on 03/18/2019 which showed cecum and ascending colon mass which was biopsied.  Patient has now decided to proceed with surgery.  Cardiology cleared the patient for surgery from cardiac standpoint stating he will be  high risk candidate due to other medical problems.  Patient was going to have palliative right hemicolectomy and underwent exploratory laparotomy by general surgery on 03/21/2019 however his mass was unresectable so he was closed back.  Oncology was consulted.  Seen by Dr. [name] who noted  pt not candidate for systemic therapy until significant clinical improvement.  Recommend CT chest without contrast when able to complete staging - with new L pleural effusion, atelectasis at R lung base.  Sequelae of sickle cell disease. Appreciate palliative care assistance.  Will continue to discuss with pt and family as well as palliative care/oncology to help clarify plans for dispo (difficult at this time, pt lives alone, therapy recommending 24 hr assistance, he would be unable to go home alone at this time - discussed with son who note family is discussing plan amongst themselves, possible that he may go home with ex wife, but she has significant health issues as well - discussed with ex wife 10/24 who notes she's undergoing radiation now and will start chemotherapy this week - she's going to speak to children to see if they can arrange another plan).  I've asked palliative care to see if they can help with d/c planning as well.  Therapy currently recommending 24 hr supervision/assistance - not currently safe for discharge as this has not been arranged - looking into SNF at this point - appreciate palliative care assistance - looks like plan for family meeting to discuss discharge.  Transition to oral pain medications.  Patient remains hopeful that he will be strong enough 1 day to be able to start chemotherapy and HD.  As of now, he is wanting aggressive care but wants to remain DNR.  Left Pleural Effusion: ?malignant with above.  He's doing well from resp standpoint at this time.  Afebrile.  White count is stable.   Will continue to monitor for now, consider thoracentesis if worsening  Stage 5 CKD -Patient has been preparing for HD but has not yet started. Nephrology prn order set utilized.  Seen by nephrology and they consulted vascular surgery for temporary HD and fistula in anticipation for hemodialysis.  Patient underwent left upper arm AV fistula placed on 03/20/2019.  Creatinine improving.   Nephrology now signed off.   Metabolic Alkalosis: 2/2 bicarb, will discontinue  Acute blood loss anemia on chronic anemia secondary to colon cancer/lower GI bleed vs sickle cell anemia: -Likely multifactorial including h/o sickle cell disease and stage 5 CKD but most likely due to his recurrence of colon cancer.  Received 1 unit of PRBC on 03/16/2019 and 1 unit again on 03/17/2019.   - stable and follow  Leukocytosis: afebrile, will continue to monitor  HTN -Blood pressure controlled.  Continue Norvasc, Coreg  Chronic diastolic CHF -85/6314 echo with preserved EF and grade 1 diastolic dysfunction -Despite stage 5 CKD, he does not appear to be volume overloaded at this time. -If he becomes volume overloaded, the likely treatment would be HD.  DM   Hypoglycemia -Hemoglobin A1c 6.9 on 03/15/2019.  HoldOnglyza.  Blood sugar reasonable.  Continue SSI.  Follow. - pt with hypoglycemia 10/27 PM, continue to monitor pt with hyperglycemia just this AM - suspect 2/2 poor PO intake - will give IVF overnight  HLD  -Continue Lipitor  Cocaine abuse -Patient denies use since July, but was positive at the time of last admission in August. -UDS negative this time.  Postoperative ileus: Doing well.  Abdominal pain improving.  BM charted from 10/22.  RIJ  central line - discontinued on 10/23  DVT prophylaxis: SCD Code Status: DNR Family Communication: none at bedside - discussed with ex wife on 10/24 - discussed with daughter becky on 10/24 Disposition Plan: pending further [place] discussions - no safe discharge plan at this time, needs 24 hr supervision, but currently no plan for him to go home where he'd have enough support at home.     Consultants:   Palliative  Surgery  GI  Cardiology  Vascular  Oncology  Procedures:   Colonoscopy 10/12  AV graft on 10/15  Antimicrobials:  Anti-infectives (From admission, onward)   Start     Dose/Rate Route Frequency Ordered Stop    03/21/19 1545  cefoTEtan (CEFOTAN) 2 g in sodium chloride 0.9 % 100 mL IVPB     2 g 200 mL/hr over 30 Minutes Intravenous To Surgery 03/21/19 1542 03/21/19 1603   03/21/19 1515  piperacillin-tazobactam (ZOSYN) IVPB 2.25 g     2.25 g 100 mL/hr over 30 Minutes Intravenous To Surgery 03/21/19 1504 03/21/19 1532   03/16/19 0000  piperacillin-tazobactam (ZOSYN) IVPB 2.25 g     2.25 g 100 mL/hr over 30 Minutes Intravenous Every 8 hours 03/15/19 1527 03/25/19 2359   03/15/19 1430  piperacillin-tazobactam (ZOSYN) IVPB 3.375 g     3.375 g 12.5 mL/hr over 240 Minutes Intravenous  Once 03/15/19 1423 03/15/19 1915     Subjective: No complaints today, lunch is good  Objective: Vitals:   03/31/19 1355 03/31/19 2005 04/01/19 0442 04/01/19 1455  BP: 125/60 134/66 140/75 107/61  Pulse: 74 78 76 68  Resp: 18 14 16 18   Temp: 97.9 F (36.6 C) 98.5 F (36.9 C) 98.9 F (37.2 C) 98.6 F (37 C)  TempSrc: Oral Oral Oral Oral  SpO2: 91% 96% 100% 98%  Weight:      Height:        Intake/Output Summary (Last 24 hours) at 04/01/2019 1919 Last data filed at 04/01/2019 1245 Gross per 24 hour  Intake 360 ml  Output 325 ml  Net 35 ml   Filed Weights   03/18/19 0800 03/21/19 1327  Weight: 64 kg 64 kg    Examination:  General: No acute distress. Cardiovascular: Heart sounds show Kessie Croston regular rate, and rhythm. Lungs: Clear to auscultation bilaterally Abdomen: Soft, nontender, nondistended.  Mild dehiscence to abdominal wounds. Neurological: Alert and oriented 3. Moves all extremities 4. Cranial nerves II through XII grossly intact. Skin: Warm and dry. No rashes or lesions. Extremities: No clubbing or cyanosis. No edema.     Data Reviewed: I have personally reviewed following labs and imaging studies  CBC: Recent Labs  Lab 03/26/19 0327  03/28/19 0501 03/29/19 1141 03/30/19 0705 03/31/19 0225 04/01/19 0704  WBC 27.8*   < > 22.6* 22.1* 22.2* 24.1* 25.9*  NEUTROABS 21.3*  --   --   --    --   --   --   HGB 8.2*   < > 8.1* 8.9* 8.1* 8.7* 8.2*  HCT 26.0*   < > 25.2* 27.5* 25.1* 26.6* 25.1*  MCV 80.2   < > 79.7* 78.6* 80.2 79.4* 78.9*  PLT 411*   < > 445* 500* 449* 502* 484*   < > = values in this interval not displayed.   Basic Metabolic Panel: Recent Labs  Lab 03/26/19 0327 03/27/19 0410 03/28/19 0501 03/29/19 1141 03/30/19 0705 03/31/19 0225 04/01/19 0704  NA 139 138 140 140 136 137 136  K 3.8 3.4* 3.5 3.5 3.8 3.5 4.2  CL 105 99 97* 93* 86* 86* 88*  CO2 23 27 32 34* 37* 39* 36*  GLUCOSE 148* 143* 160* 108* 184* 157* 203*  BUN 61* 49* 39* 27* 23 23 22   CREATININE 4.43* 3.51* 2.93* 2.73* 2.85* 3.07* 3.22*  CALCIUM 8.7* 8.5* 8.5* 8.8* 8.5* 8.6* 8.7*  MG  --  2.1 2.1  --  1.9 2.0 2.0  PHOS 3.3 3.4 3.3 3.2  --   --   --    GFR: Estimated Creatinine Clearance: 19.3 mL/min (Yony Roulston) (by C-G formula based on SCr of 3.22 mg/dL (H)). Liver Function Tests: Recent Labs  Lab 03/28/19 0501 03/29/19 1141 03/30/19 0705 03/31/19 0225 04/01/19 0704  AST  --  16 16 18 18   ALT  --  8 7 8 7   ALKPHOS  --  77 77 76 78  BILITOT  --  0.5 1.0 0.5 0.6  PROT  --  6.3* 5.9* 6.0* 5.7*  ALBUMIN 1.8* 2.0* 1.9* 1.9* 1.9*   No results for input(s): LIPASE, AMYLASE in the last 168 hours. No results for input(s): AMMONIA in the last 168 hours. Coagulation Profile: No results for input(s): INR, PROTIME in the last 168 hours. Cardiac Enzymes: No results for input(s): CKTOTAL, CKMB, CKMBINDEX, TROPONINI in the last 168 hours. BNP (last 3 results) No results for input(s): PROBNP in the last 8760 hours. HbA1C: No results for input(s): HGBA1C in the last 72 hours. CBG: Recent Labs  Lab 04/01/19 0800 04/01/19 1207 04/01/19 1711 04/01/19 1721 04/01/19 1755  GLUCAP 203* 168* 57* 61* 86   Lipid Profile: No results for input(s): CHOL, HDL, LDLCALC, TRIG, CHOLHDL, LDLDIRECT in the last 72 hours. Thyroid Function Tests: No results for input(s): TSH, T4TOTAL, FREET4, T3FREE, THYROIDAB in  the last 72 hours. Anemia Panel: No results for input(s): VITAMINB12, FOLATE, FERRITIN, TIBC, IRON, RETICCTPCT in the last 72 hours. Sepsis Labs: No results for input(s): PROCALCITON, LATICACIDVEN in the last 168 hours.  No results found for this or any previous visit (from the past 240 hour(s)).       Radiology Studies: No results found.      Scheduled Meds:  amLODipine  5 mg Oral Daily   atorvastatin  20 mg Oral Daily   carvedilol  25 mg Oral BID WC   Chlorhexidine Gluconate Cloth  6 each Topical Daily   feeding supplement  1 Container Oral TID BM   folic acid  1 mg Oral Daily   gabapentin  300 mg Oral Daily   insulin aspart  0-9 Units Subcutaneous TID WC   multivitamin with minerals  1 tablet Oral Daily   mupirocin ointment  1 application Topical BID   nystatin  5 mL Oral QID   pantoprazole  40 mg Oral Daily   sodium chloride flush  10-40 mL Intracatheter Q12H   Continuous Infusions:  sodium chloride 10 mL/hr at 03/21/19 1430     LOS: 17 days    Time spent: over 30 min    Fayrene Helper, MD [facility] Pager AMION  If 7PM-7AM, please contact night-coverage [email or web address] Password [facility] 04/01/2019, 7:19 PM

## 2019-04-01 NOTE — Progress Notes (Signed)
Physical Therapy Treatment Patient Details Name: [name] MRN: [id number] DOB: 08/04/1950 Today's Date: 04/01/2019    History of Present Illness Pt is a 68 y/o male admitted secondary to n/v and RLQ abdominal pain. Imaging indicates recurrent colon cancer in the cecum/TI. Colonoscopy on 10/13 showed malignant tumor in the ascending colon and in the cecum. path showed poorly differentiated carcinoma. Pt underwent exploratory laparotomy surgery on 03/21/19; however, intraoperative findings were that tumor was unresectable. Consults to oncology and palliative were recommended. PMH including but not limited to colon cancer s/p some type of colon resection in 1982, CKD stage 5, DM, HTN, CHF and cocaine abuse.    PT Comments    Patient received in bed, much more awake and alert today and very pleasant. Able to perform bed mobility with Mod(I), able to don socks with S but SPO2 down to 87-88% on room air so replaced 2LPM and sats quickly recovered. Performed functional transfers and gait approximately 100ft with RW and S on 2LPM O2 and sats in the 90s on supplemental oxygen. He was left up in the chair with all needs met, chair alarm active, and RN present. Updating recommendation to SNF as he will not have 24/7 S/assist at home.     Follow Up Recommendations  SNF;Supervision/Assistance - 24 hour     Equipment Recommendations  3in1 (PT)    Recommendations for Other Services       Precautions / Restrictions Precautions Precautions: Fall Precaution Comments: abdominal pain Restrictions Weight Bearing Restrictions: No    Mobility  Bed Mobility Overal bed mobility: Modified Independent             General bed mobility comments: Mod(I), no physical assist given  Transfers Overall transfer level: Needs assistance Equipment used: Rolling walker (2 wheeled) Transfers: Sit to/from Stand Sit to Stand: Supervision         General transfer comment: S for safety, no physical  assist given  Ambulation/Gait Ambulation/Gait assistance: Supervision Gait Distance (Feet): 100 Feet Assistive device: Rolling walker (2 wheeled) Gait Pattern/deviations: Step-through pattern;Trunk flexed;Decreased step length - right;Decreased step length - left;Shuffle Gait velocity: decreased   General Gait Details: slow and steady with RW, gait speed and steps much improved today, SpO2 WNL on 2LPM O2   Stairs             Wheelchair Mobility    Modified Rankin (Stroke Patients Only)       Balance Overall balance assessment: Needs assistance Sitting-balance support: Feet supported Sitting balance-Leahy Scale: Normal Sitting balance - Comments: able to don<>doff shoes from EOB   Standing balance support: Bilateral upper extremity supported Standing balance-Leahy Scale: Fair Standing balance comment: reliance on B UE support, able to maintain static balance without B UE support                            Cognition Arousal/Alertness: Awake/alert Behavior During Therapy: WFL for tasks assessed/performed Overall Cognitive Status: No family/caregiver present to determine baseline cognitive functioning Area of Impairment: Problem solving;Following commands;Orientation                 Orientation Level: Disoriented to;Time   Memory: Decreased short-term memory Following Commands: Follows one step commands with increased time;Follows one step commands consistently Safety/Judgement: Decreased awareness of deficits;Decreased awareness of safety   Problem Solving: Slow processing;Decreased initiation General Comments: patient familiar with pulse ox but unable to state process of checking O2 and reasons for using   supplemental O2      Exercises      General Comments        Pertinent Vitals/Pain Pain Assessment: Faces Faces Pain Scale: Hurts little more Pain Location: belly pain Pain Intervention(s): Limited activity within patient's  tolerance;Monitored during session    Home Living                      Prior Function            PT Goals (current goals can now be found in the care plan section) Acute Rehab PT Goals Patient Stated Goal: to find out what else can be done to treat his cancer PT Goal Formulation: With patient Time For Goal Achievement: 04/05/19 Potential to Achieve Goals: Fair Progress towards PT goals: Progressing toward goals    Frequency    Min 3X/week      PT Plan Discharge plan needs to be updated    Co-evaluation              AM-PAC PT "6 Clicks" Mobility   Outcome Measure  Help needed turning from your back to your side while in a flat bed without using bedrails?: None Help needed moving from lying on your back to sitting on the side of a flat bed without using bedrails?: None Help needed moving to and from a bed to a chair (including a wheelchair)?: A Little Help needed standing up from a chair using your arms (e.g., wheelchair or bedside chair)?: A Little Help needed to walk in hospital room?: A Little Help needed climbing 3-5 steps with a railing? : A Little 6 Click Score: 20    End of Session Equipment Utilized During Treatment: Oxygen Activity Tolerance: Patient tolerated treatment well Patient left: in chair;with chair alarm set;with call bell/phone within reach;with nursing/sitter in room   PT Visit Diagnosis: Muscle weakness (generalized) (M62.81);Pain;Other abnormalities of gait and mobility (R26.89) Pain - Right/Left: (abdominal pain) Pain - part of body: (abdomen)     Time: 1021-1040 PT Time Calculation (min) (ACUTE ONLY): 19 min  Charges:  $Gait Training: 8-22 mins                     Kristen Unger PT, DPT, CBIS  Supplemental Physical Therapist [place]    Pager [phone] Acute Rehab Office [phone]    

## 2019-04-02 DIAGNOSIS — K37 Unspecified appendicitis: Secondary | ICD-10-CM | POA: Diagnosis not present

## 2019-04-02 DIAGNOSIS — C180 Malignant neoplasm of cecum: Secondary | ICD-10-CM | POA: Diagnosis not present

## 2019-04-02 DIAGNOSIS — C182 Malignant neoplasm of ascending colon: Secondary | ICD-10-CM | POA: Diagnosis not present

## 2019-04-02 DIAGNOSIS — N185 Chronic kidney disease, stage 5: Secondary | ICD-10-CM | POA: Diagnosis not present

## 2019-04-02 DIAGNOSIS — D473 Essential (hemorrhagic) thrombocythemia: Secondary | ICD-10-CM | POA: Diagnosis not present

## 2019-04-02 DIAGNOSIS — I10 Essential (primary) hypertension: Secondary | ICD-10-CM | POA: Diagnosis not present

## 2019-04-02 DIAGNOSIS — Z7189 Other specified counseling: Secondary | ICD-10-CM | POA: Diagnosis not present

## 2019-04-02 LAB — CBC
HCT: 27.3 % — ABNORMAL LOW (ref 39.0–52.0)
Hemoglobin: 8.6 g/dL — ABNORMAL LOW (ref 13.0–17.0)
MCH: 25.5 pg — ABNORMAL LOW (ref 26.0–34.0)
MCHC: 31.5 g/dL (ref 30.0–36.0)
MCV: 81 fL (ref 80.0–100.0)
Platelets: 383 10*3/uL (ref 150–400)
RBC: 3.37 MIL/uL — ABNORMAL LOW (ref 4.22–5.81)
RDW: 21.9 % — ABNORMAL HIGH (ref 11.5–15.5)
WBC: 22.5 10*3/uL — ABNORMAL HIGH (ref 4.0–10.5)
nRBC: 0.2 % (ref 0.0–0.2)

## 2019-04-02 LAB — COMPREHENSIVE METABOLIC PANEL
ALT: 7 U/L (ref 0–44)
AST: 18 U/L (ref 15–41)
Albumin: 1.9 g/dL — ABNORMAL LOW (ref 3.5–5.0)
Alkaline Phosphatase: 83 U/L (ref 38–126)
Anion gap: 11 (ref 5–15)
BUN: 21 mg/dL (ref 8–23)
CO2: 35 mmol/L — ABNORMAL HIGH (ref 22–32)
Calcium: 8.6 mg/dL — ABNORMAL LOW (ref 8.9–10.3)
Chloride: 89 mmol/L — ABNORMAL LOW (ref 98–111)
Creatinine, Ser: 3.23 mg/dL — ABNORMAL HIGH (ref 0.61–1.24)
GFR calc Af Amer: 21 mL/min — ABNORMAL LOW (ref >60)
GFR calc non Af Amer: 18 mL/min — ABNORMAL LOW (ref >60)
Glucose, Bld: 202 mg/dL — ABNORMAL HIGH (ref 70–99)
Potassium: 4.2 mmol/L (ref 3.5–5.1)
Sodium: 135 mmol/L (ref 135–145)
Total Bilirubin: 0.7 mg/dL (ref 0.3–1.2)
Total Protein: 6.1 g/dL — ABNORMAL LOW (ref 6.5–8.1)

## 2019-04-02 LAB — MAGNESIUM: Magnesium: 2.1 mg/dL (ref 1.7–2.4)

## 2019-04-02 LAB — GLUCOSE, CAPILLARY
Glucose-Capillary: 199 mg/dL — ABNORMAL HIGH (ref 70–99)
Glucose-Capillary: 119 mg/dL — ABNORMAL HIGH (ref 70–99)
Glucose-Capillary: 160 mg/dL — ABNORMAL HIGH (ref 70–99)
Glucose-Capillary: 72 mg/dL (ref 70–99)

## 2019-04-02 NOTE — Progress Notes (Signed)
PALLIATIVE NOTE:  At family's request plans to have follow-up [place] meeting today at [place]. Daughter, [name] aware I will meet her at patient's room and we will call siblings via speaker phone.   Alda Lea, AGPCNP-BC Palliative Medicine Team  Phone: [phone] Pager: [phone] Amion: N. [place]   NO CHARGE

## 2019-04-02 NOTE — TOC Progression Note (Addendum)
Transition of Care [facility]) - Progression Note    Patient Details  Name: [name] MRN: [id number] Date of Birth: 1951-01-27  Transition of [facility] CM/SW [place], [place] Phone Number: 04/02/2019, 9:51 AM  Clinical Narrative:    2:28pm- Pt authorization was denied due to lack of daily skilled needs, they feel that pt needs could be met at a lower level of care.   2:21pm- Pt peer to peer was completed by Dr. Wynelle Cleveland, await update on determination.  10:49am- CSW spoke with Dr. Ree Kida who is the Physician Advisor today. We discussed case, she will also discuss case with attending MD Dr. Wynelle Cleveland to determine if peer to peer appropriate.   9:51am- Pt care team has been offered a peer to peer to be done with [facility] by this afternoon at 2pm (10/28 at 14:00). According to new process CSW has contacted UM team member [employer]. Await f/u response. Will also let PMT know as they are arranging a meeting with pt family.    Expected Discharge Plan: Skilled Nursing Facility Barriers to Discharge: Continued Medical Work up  Expected Discharge Plan and Services Expected Discharge Plan: [place] In-house Referral: Clinical Social Work Discharge Planning Services: CM Consult Post Acute Care Choice: [place] arrangements for the past 2 months: Single Family Home                 DME Arranged: N/A  HH Arranged: Patient Refused HH      Social Determinants of Health (SDOH) Interventions    Readmission Risk Interventions Readmission Risk Prevention Plan 03/30/2019 03/30/2019 03/19/2019  Transportation Screening Complete Complete Complete  PCP or Specialist Appt within 3-5 Days - - -  HRI or [place] for [place] Planning/Counseling - - -  [place] - - -  Medication Review Press photographer) Complete Complete Complete  PCP or Specialist appointment within 3-5 days of discharge  Complete Complete Complete  HRI or Home Care Consult Complete Complete Complete  SW Recovery Care/Counseling Consult Complete Complete Complete  Palliative Care Screening Complete Complete Not Applicable  Skilled Nursing Facility Complete Complete Complete  Some recent data might be hidden

## 2019-04-02 NOTE — Progress Notes (Addendum)
PROGRESS NOTE    [name]   HBZ:[id number]  DOB: 05/11/1951  DOA: 03/15/2019 PCP: Nolene Ebbs, MD   Brief Narrative:  Justin Fellows Smootis a 68 y.o.malewith medical history significant ofsickle cell anemia (SS disease); ESRDnoton HD; HTN; HLD; DM; and colon CA presenting with nausea and vomiting.  He had a vocal cord lesion removed with CO2 laser excision during microlaryngoscopy on 10/7. He went home and then started vomiting the evening of 10.7. No vomiting prior to surgery. It would come and go. He has been losing a lot of weight and so has been drinking Boost. Unintentional weight loss of about 50 pounds in the last 4-5 months. He started having night sweats recently. No LAD. Slight diarrhea after the surgery, thought it was related to the anesthesia. Normal BMs other than post-operatively. Stools have been black because he has been taking iron. He got 4 units of blood a week or two before the surgery, that increased his Hgb from 2.5 up to 8.9. He has not yet started HD, still making urine.  His ex-wife has been caring for him for the last 3 weeks. His colon cancer was 20 years ago. He has never had a colonoscopy in over 25 years.  He was last admitted 8/13-15 for symptomatic anemia from CKD and given IV iron and Epogen.  Upon arrival to ED, patient had c/o abdominal pain and vomiting. Had laryngeal biopsy a few days ago. WBC 40k. CT read as progression of colon CA at cecum and TI with ?typhlitis. Patient was admitted under hospitalist service with GI and surgery consultation. Patient underwent colonoscopy on 03/18/2019 which showed cecum and ascending colon mass which was biopsied. Patient's hemoglobin dropped from 7.026.5 on 03/15/2019 and he received 1 unit of PRBC transfusion but then again it dropped to 6.5 so he received second unit of transfusion on 03/17/2019. General surgery consulted cardiology and patient was cleared for surgery from  cardiac standpoint. Palliative medicine was also consulted per general surgery recommendations. Patient decided to proceed with surgery. Nephrology consulted vascular surgery for AV graft which was placed on 03/20/2019. Patient then underwent exploratory laparotomy by general surgery on 03/21/2019 however his mass was unresectable. Patient then expressed wishes to see oncology for consideration of radiation and chemotherapy. Dr. [name] from oncology saw patient and he recommends against chemotherapy at this point in time due to several comorbidities that patient has.Palliative care was consulted.          Assessment & Plan:   Principal Problem:   Colon cancer with post op ileus - cancer recurrence after about 25 yrs. - failed to get a follow up colonoscopy - 10/16- attempt for palliative hemicolectomy but mass was unresectable and thus he was closed back up - ileus resolved - Oncology states that although he is a poor candidate, the will offer him palliative chemo   - the patient and family do not want to transition to comfort care and would like for him to ultimately be strong enough for chemo - pain control with IV Dilaudid, Oxycodone - receiving IV Zofran for nausea   Active Problems:  New left pleural effusion on CXR - ? If Due to cancer -     Type 2 diabetes mellitus  - with hypoglycemia due to poor oral intake - A1c 6.9 - holding Onglyza    CKD (chronic kidney disease) stage 5, GFR less than 15 ml/min (HCC) - LUA AVF placed on 10/15- nephrology was following and as  renal function remained stable, has signed off    Anemia due to acute blood loss - s/p 2 U PRBC transfusion     Malnutrition of moderate degree - dietary supplements ordered    Hypertension -cont Norvasc and Coreg  Cocaine abuse in the past (noted on UDS in August)  Time spent in minutes: 40 DVT prophylaxis: SCDs Code Status: DNR Family Communication:   Disposition Plan: home - Peer to peer done  with insurance company today- they have denied to pay for SNF-  Consultants:   Palliative  Surgery  GI  Cardiology  Vascular  Oncology  Procedures:   Colonoscopy 10/12  AV graft on 10/15 Antimicrobials:  Anti-infectives (From admission, onward)   Start     Dose/Rate Route Frequency Ordered Stop   03/21/19 1545  cefoTEtan (CEFOTAN) 2 g in sodium chloride 0.9 % 100 mL IVPB     2 g 200 mL/hr over 30 Minutes Intravenous To Surgery 03/21/19 1542 03/21/19 1603   03/21/19 1515  piperacillin-tazobactam (ZOSYN) IVPB 2.25 g     2.25 g 100 mL/hr over 30 Minutes Intravenous To Surgery 03/21/19 1504 03/21/19 1532   03/16/19 0000  piperacillin-tazobactam (ZOSYN) IVPB 2.25 g     2.25 g 100 mL/hr over 30 Minutes Intravenous Every 8 hours 03/15/19 1527 03/25/19 2359   03/15/19 1430  piperacillin-tazobactam (ZOSYN) IVPB 3.375 g     3.375 g 12.5 mL/hr over 240 Minutes Intravenous  Once 03/15/19 1423 03/15/19 1915       Objective: Vitals:   04/01/19 0442 04/01/19 1455 04/01/19 2035 04/02/19 0616  BP: 140/75 107/61 130/77 (!) 112/59  Pulse: 76 68 75 74  Resp: 16 18 18 18   Temp: 98.9 F (37.2 C) 98.6 F (37 C) 98.6 F (37 C) 99 F (37.2 C)  TempSrc: Oral Oral Oral Oral  SpO2: 100% 98% 96% 90%  Weight:      Height:        Intake/Output Summary (Last 24 hours) at 04/02/2019 1349 Last data filed at 04/02/2019 1000 Gross per 24 hour  Intake 698.76 ml  Output 600 ml  Net 98.76 ml   Filed Weights   03/18/19 0800 03/21/19 1327  Weight: 64 kg 64 kg    Examination: General exam: Appears comfortable  HEENT: PERRLA, oral mucosa moist, no sclera icterus or thrush Respiratory system: Clear to auscultation. Respiratory effort normal. Cardiovascular system: S1 & S2 heard, RRR.   Gastrointestinal system: Abdomen soft, non-tender, nondistended. Normal bowel sounds. Central nervous system: Alert and oriented. No focal neurological deficits. Extremities: No cyanosis, clubbing or  edema Skin: No rashes or ulcers Psychiatry:  Mood & affect appropriate.     Data Reviewed: I have personally reviewed following labs and imaging studies  CBC: Recent Labs  Lab 03/29/19 1141 03/30/19 0705 03/31/19 0225 04/01/19 0704 04/02/19 0436  WBC 22.1* 22.2* 24.1* 25.9* 22.5*  HGB 8.9* 8.1* 8.7* 8.2* 8.6*  HCT 27.5* 25.1* 26.6* 25.1* 27.3*  MCV 78.6* 80.2 79.4* 78.9* 81.0  PLT 500* 449* 502* 484* 767   Basic Metabolic Panel: Recent Labs  Lab 03/27/19 0410 03/28/19 0501 03/29/19 1141 03/30/19 0705 03/31/19 0225 04/01/19 0704 04/02/19 0436  NA 138 140 140 136 137 136 135  K 3.4* 3.5 3.5 3.8 3.5 4.2 4.2  CL 99 97* 93* 86* 86* 88* 89*  CO2 27 32 34* 37* 39* 36* 35*  GLUCOSE 143* 160* 108* 184* 157* 203* 202*  BUN 49* 39* 27* 23 23 22 21   CREATININE 3.51*  2.93* 2.73* 2.85* 3.07* 3.22* 3.23*  CALCIUM 8.5* 8.5* 8.8* 8.5* 8.6* 8.7* 8.6*  MG 2.1 2.1  --  1.9 2.0 2.0 2.1  PHOS 3.4 3.3 3.2  --   --   --   --    GFR: Estimated Creatinine Clearance: 19.3 mL/min (A) (by C-G formula based on SCr of 3.23 mg/dL (H)). Liver Function Tests: Recent Labs  Lab 03/29/19 1141 03/30/19 0705 03/31/19 0225 04/01/19 0704 04/02/19 0436  AST 16 16 18 18 18   ALT 8 7 8 7 7   ALKPHOS 77 77 76 78 83  BILITOT 0.5 1.0 0.5 0.6 0.7  PROT 6.3* 5.9* 6.0* 5.7* 6.1*  ALBUMIN 2.0* 1.9* 1.9* 1.9* 1.9*   No results for input(s): LIPASE, AMYLASE in the last 168 hours. No results for input(s): AMMONIA in the last 168 hours. Coagulation Profile: No results for input(s): INR, PROTIME in the last 168 hours. Cardiac Enzymes: No results for input(s): CKTOTAL, CKMB, CKMBINDEX, TROPONINI in the last 168 hours. BNP (last 3 results) No results for input(s): PROBNP in the last 8760 hours. HbA1C: No results for input(s): HGBA1C in the last 72 hours. CBG: Recent Labs  Lab 04/01/19 1721 04/01/19 1755 04/01/19 2031 04/02/19 0814 04/02/19 1251  GLUCAP 61* 86 200* 199* 119*   Lipid Profile: No  results for input(s): CHOL, HDL, LDLCALC, TRIG, CHOLHDL, LDLDIRECT in the last 72 hours. Thyroid Function Tests: No results for input(s): TSH, T4TOTAL, FREET4, T3FREE, THYROIDAB in the last 72 hours. Anemia Panel: No results for input(s): VITAMINB12, FOLATE, FERRITIN, TIBC, IRON, RETICCTPCT in the last 72 hours. Urine analysis:    Component Value Date/Time   COLORURINE YELLOW 03/15/2019 1630   APPEARANCEUR CLEAR 03/15/2019 1630   LABSPEC 1.012 03/15/2019 1630   PHURINE 5.0 03/15/2019 1630   GLUCOSEU NEGATIVE 03/15/2019 1630   HGBUR NEGATIVE 03/15/2019 1630   BILIRUBINUR NEGATIVE 03/15/2019 1630   KETONESUR NEGATIVE 03/15/2019 1630   PROTEINUR NEGATIVE 03/15/2019 1630   UROBILINOGEN 1.0 09/06/2014 2203   NITRITE NEGATIVE 03/15/2019 1630   LEUKOCYTESUR NEGATIVE 03/15/2019 1630   Sepsis Labs: @LABRCNTIP (procalcitonin:4,lacticidven:4) )No results found for this or any previous visit (from the past 240 hour(s)).       Radiology Studies: [place] Ekg Site Rite  Result Date: 04/01/2019 If [facility] image not attached, placement could not be confirmed due to current cardiac rhythm.     Scheduled Meds:  amLODipine  5 mg Oral Daily   atorvastatin  20 mg Oral Daily   carvedilol  25 mg Oral BID WC   Chlorhexidine Gluconate Cloth  6 each Topical Daily   feeding supplement  1 Container Oral TID BM   folic acid  1 mg Oral Daily   gabapentin  300 mg Oral Daily   insulin aspart  0-9 Units Subcutaneous TID WC   multivitamin with minerals  1 tablet Oral Daily   mupirocin ointment  1 application Topical BID   nystatin  5 mL Oral QID   pantoprazole  40 mg Oral Daily   sodium chloride flush  10-40 mL Intracatheter Q12H   Continuous Infusions:  sodium chloride 10 mL/hr at 03/21/19 1430   dextrose 5 % and 0.45% NaCl Stopped (04/02/19 0642)     LOS: 18 days      Debbe Odea, MD [facility] Pager: [email or web address] Password [email or web address] 04/02/2019, 1:49 PM

## 2019-04-02 NOTE — Progress Notes (Signed)
IV team unsuccessful with iv placement. No plans for aggressive interventions requiring access such as a central line or picc line line. Pt tolerating po diet and fluids. No hypoglycemic episodes today. Per palliative care, pt may be discharged home in a day or two

## 2019-04-02 NOTE — Progress Notes (Signed)
Physical Therapy Treatment Patient Details Name: [name] MRN: [id number] DOB: [birth year] Today's Date: 04/02/2019    History of Present Illness Pt is a 68 y/o male admitted secondary to n/v and RLQ abdominal pain. Imaging indicates recurrent colon cancer in the cecum/TI. Colonoscopy on 10/13 showed malignant tumor in the ascending colon and in the cecum. path showed poorly differentiated carcinoma. Pt underwent exploratory laparotomy surgery on 03/21/19; however, intraoperative findings were that tumor was unresectable. Consults to oncology and palliative were recommended. PMH including but not limited to colon cancer s/p some type of colon resection in 1982, CKD stage 5, DM, HTN, CHF and cocaine abuse.    PT Comments    Patient received in bed following family/medical team meeting, initially very motivated to continue walking and exercising with PT, SpO2 on room air 96% this morning. Set up room and prepared RW/equipment necessary for gait, then patient abruptly changed his mind, stating "I think I'll actually take today off" and reporting increased pain today. Provided extensive education on importance of mobility and progressing with PT, attempted bed exercises with patient, he half-heartedly attempted exercises then continued to politely decline. He was left in bed with all needs met, bed alarm active this afternoon.    Follow Up Recommendations  SNF;Supervision/Assistance - 24 hour     Equipment Recommendations  3in1 (PT)    Recommendations for Other Services       Precautions / Restrictions Precautions Precautions: Fall Precaution Comments: abdominal pain Restrictions Weight Bearing Restrictions: No    Mobility  Bed Mobility               General bed mobility comments: refused  Transfers                 General transfer comment: refused  Ambulation/Gait             General Gait Details: refused   Stairs             Wheelchair  Mobility    Modified Rankin (Stroke Patients Only)       Balance Overall balance assessment: Needs assistance Sitting-balance support: Feet supported Sitting balance-Leahy Scale: Normal Sitting balance - Comments: able to don<>doff shoes from EOB   Standing balance support: Bilateral upper extremity supported Standing balance-Leahy Scale: Fair Standing balance comment: reliance on B UE support, able to maintain static balance without B UE support                            Cognition Arousal/Alertness: Awake/alert Behavior During Therapy: WFL for tasks assessed/performed Overall Cognitive Status: No family/caregiver present to determine baseline cognitive functioning Area of Impairment: Problem solving;Following commands;Orientation                 Orientation Level: Disoriented to;Time   Memory: Decreased short-term memory Following Commands: Follows one step commands with increased time;Follows one step commands consistently Safety/Judgement: Decreased awareness of deficits;Decreased awareness of safety   Problem Solving: Slow processing;Decreased initiation        Exercises      General Comments        Pertinent Vitals/Pain Pain Assessment: Faces Faces Pain Scale: Hurts whole lot Pain Location: belly and chest pain Pain Descriptors / Indicators: Sore;Grimacing;Discomfort Pain Intervention(s): Limited activity within patient's tolerance;Monitored during session    Home Living                      Prior Function  PT Goals (current goals can now be found in the care plan section) Acute Rehab PT Goals Patient Stated Goal: to find out what else can be done to treat his cancer PT Goal Formulation: With patient Time For Goal Achievement: 04/05/19 Potential to Achieve Goals: Fair Progress towards PT goals: Not progressing toward goals - comment(limited by pain today)    Frequency    Min 3X/week      PT Plan Current  plan remains appropriate    Co-evaluation              AM-PAC PT "6 Clicks" Mobility   Outcome Measure  Help needed turning from your back to your side while in a flat bed without using bedrails?: None Help needed moving from lying on your back to sitting on the side of a flat bed without using bedrails?: None Help needed moving to and from a bed to a chair (including a wheelchair)?: A Little Help needed standing up from a chair using your arms (e.g., wheelchair or bedside chair)?: A Little Help needed to walk in hospital room?: A Little Help needed climbing 3-5 steps with a railing? : A Little 6 Click Score: 20    End of Session   Activity Tolerance: Patient limited by pain Patient left: in bed;with call bell/phone within reach;with bed alarm set   PT Visit Diagnosis: Muscle weakness (generalized) (M62.81);Pain;Other abnormalities of gait and mobility (R26.89) Pain - Right/Left: (abdominal pain) Pain - part of body: (abdominal pain)     Time: 1155-2080 PT Time Calculation (min) (ACUTE ONLY): 20 min  Charges:  $Self Care/Home Management: [place], DPT, CBIS  Supplemental Physical Therapist [place]    Pager [phone] Acute Rehab Office [phone]

## 2019-04-02 NOTE — Progress Notes (Signed)
Unsuccessful attempt at PIV. No other options for peripheral access noted. Recommend IR for placement of indwelling catheter if IV access is needed.

## 2019-04-02 NOTE — Progress Notes (Signed)
PALLIATIVE NOTE:  Patient sitting up on the side of bed. Denies pain or shortness of breath. States he slept well overnight with no complaints. His breakfast tray is at the bedside and most of the items are empty. He states "look at my tray, I am trying to do better!" support given. His daughter, [name] is at the bedside for family meeting.   Family meeting at the bedside with patient and daughter. Son [name]), daughters Juliann Pulse, Beatrice, and ex-wife [name]) all were on speaker phone. Updates provided to family. They verbalized understanding and appreciation of continued support and care being received.   We discussed goals of care. All family provided their input. Family and patient's goals remain clear and set. They are hopeful [name] will show signs of improvement. They expressed hopes that he could be approved for SNF for rehab with the support of outpatient palliative. If [name] does not show signs of improvement and able to be a candidate for treatment (chemo) then they are all in agreement that goals for care are to be kept comfortable in his home with hospice support for EOL. Both patient and all of his family verbalized this as their goals. I discussed in detail concerns for patient's performance status and overall poor nutrition and how significant improvement would be needed before any consideration would occur in terms of oncologic interventions. Patient and family verbalized understanding. Family asked appropriate questions regarding palliative and hospice and how to transition between the 2 when the time is appropriate. I discussed in detail the differences in both levels of support and family is aware patient can transition to hospice at anytime by expressing their needs to their outpatient palliative team or if they decided on a more hospice approach before discharge to let the medical team know. Family and patient verbalized understanding.   Updates provided to family on the process of  being approved and receiving a bed offer from SNF facilities. I shared with family at this time insurance has not provided an authorization and is also requesting additional information. I emphasized the need to began planning for plan B in the event which may be likely that he is not approved for SNF. Family verbalized understanding. We discussed at length the amount of care and type of care patient would need. Family verbalized understanding expressing they would pull together to make sure patient had appropriate care as needed. Discussions took place that patient's 2 nephews are willing to move in with him and be his main support with his children all rotating shifts to provide them relief and assisting with errands and any appointments. All children expressed plans to communicate with their employers for flexibility to be able to support their father. Support given and appreciation provided to family for pulling together and collaborating for the needs of [name]. He also verbalized appreciation. Family is aware once we have a final approval or denial from the insurance we would notify them which most likely would be tomorrow. They verbalized understanding and plans to re-discuss this afternoon amongst themselves and finalize a schedule as plan B. Family asking when patient would potentially be discharged if unable to go to SNF. Family updated and aware that the attending makes the final decision, however I would estimate it could be by Friday. They verbalized understanding sharing they will begin making arrangements to be prepared.   All questions answered.   -awake, A&O x3, NAD -RRR -CTA bilaterally -mood appropriate  Plan -DNR -Continue current plan of care -Goals are  set and clear. Patient and family hopeful for some improvement and possibility of chemo, however if this does not happen or patient does not show significant improvement family/patient open to transitioning to a more comfort  approach -Family making arrangements for patient to return home w/24/7 family support HH/OP Palliative support in the event SNF is denied. Michiel Cowboy, CSW is aware of plan. Pending peer to peer review.  -PMT will continue to support and follow as needed.   Time In: 1100 Time Out: 1201 Total Time:61 min.   Greater than 50%  of this time was spent counseling and coordinating care related to the above assessment and plan.  Alda Lea, AGPCNP-BC Palliative Medicine Team  Phone: [phone] Pager: [phone] Amion: Bjorn Pippin

## 2019-04-03 DIAGNOSIS — C182 Malignant neoplasm of ascending colon: Secondary | ICD-10-CM | POA: Diagnosis not present

## 2019-04-03 DIAGNOSIS — N186 End stage renal disease: Secondary | ICD-10-CM | POA: Diagnosis not present

## 2019-04-03 DIAGNOSIS — Z7189 Other specified counseling: Secondary | ICD-10-CM | POA: Diagnosis not present

## 2019-04-03 DIAGNOSIS — N185 Chronic kidney disease, stage 5: Secondary | ICD-10-CM | POA: Diagnosis not present

## 2019-04-03 DIAGNOSIS — D649 Anemia, unspecified: Secondary | ICD-10-CM | POA: Diagnosis not present

## 2019-04-03 DIAGNOSIS — C180 Malignant neoplasm of cecum: Secondary | ICD-10-CM | POA: Diagnosis not present

## 2019-04-03 LAB — GLUCOSE, CAPILLARY
Glucose-Capillary: 204 mg/dL — ABNORMAL HIGH (ref 70–99)
Glucose-Capillary: 126 mg/dL — ABNORMAL HIGH (ref 70–99)

## 2019-04-03 MED ORDER — OXYCODONE HCL 5 MG PO TABS: 5.0000 mg | ORAL_TABLET | ORAL | 0 refills | Status: DC | PRN

## 2019-04-03 MED ORDER — POLYETHYLENE GLYCOL 3350 17 GM/SCOOP PO POWD: 17.0000 g | Freq: Every day | ORAL | 3 refills | Status: AC

## 2019-04-03 MED ORDER — ACETAMINOPHEN 500 MG PO TABS: 650.0000 mg | ORAL_TABLET | Freq: Four times a day (QID) | ORAL | 0 refills | Status: AC | PRN

## 2019-04-03 MED ORDER — ONDANSETRON HCL 4 MG PO TABS: 4.0000 mg | ORAL_TABLET | Freq: Four times a day (QID) | ORAL | 0 refills | Status: DC | PRN

## 2019-04-03 MED ORDER — OXYCODONE HCL 10 MG PO TABS: 10.0000 mg | ORAL_TABLET | ORAL | 0 refills | Status: DC | PRN

## 2019-04-03 MED ORDER — SORBITOL 70 % SOLN: 30.0000 mL | 3 refills | Status: DC | PRN

## 2019-04-03 MED ORDER — ADULT MULTIVITAMIN W/MINERALS CH: 1.0000 | ORAL_TABLET | Freq: Every day | ORAL | 3 refills | Status: DC

## 2019-04-03 MED ORDER — ENSURE ENLIVE PO LIQD: 237.0000 mL | Freq: Two times a day (BID) | ORAL | 12 refills | Status: DC

## 2019-04-03 MED ORDER — POLYETHYLENE GLYCOL 3350 17 G PO PACK
17.0000 g | PACK | Freq: Every day | ORAL | Status: DC
  Administered 2019-04-03: 17 g via ORAL

## 2019-04-03 MED ORDER — POLYETHYLENE GLYCOL 3350 17 GM/SCOOP PO POWD: 17.0000 g | Freq: Every day | ORAL | 3 refills | Status: DC | PRN

## 2019-04-03 MED ORDER — ENSURE ENLIVE PO LIQD: 237.0000 mL | Freq: Two times a day (BID) | ORAL | Status: DC

## 2019-04-03 MED FILL — oxyCODONE HCL 5 MG TABS: 5 | 5 days supply | Qty: 30 | Fill #0

## 2019-04-03 MED FILL — oxyCODONE HCL 10 MG TABS: 10 | 5 days supply | Qty: 30 | Fill #0

## 2019-04-03 NOTE — Discharge Summary (Addendum)
Physician Discharge Summary  [name] LPF:[id number] DOB: Jun 06, 1950 DOA: 03/15/2019  PCP: Justin Ebbs, MD  Admit date: 03/15/2019 Discharge date: 04/05/2019  Admitted From: home  Disposition:  home   Recommendations for Outpatient Follow-up:  1. The patient and family wish to pursue aggressive care with hope for him to regain strength and subsequently received palliative treatment for his cancer  Home Health:  ordered  Discharge Condition:  stable  CODE STATUS:  DNR  Consultants:   Palliative  Surgery  GI  Cardiology  Vascular  Oncology   Discharge Diagnoses:  Principal Problem:   Colon cancer ([place]) Active Problems:   CKD (chronic kidney disease) stage 5, GFR less than 15 ml/min (HCC)   Anemia   Goals of care, counseling/discussion   Thrombocytosis (HCC)   Malnutrition of moderate degree   Sickle cell disease   Hypertension   Type 2 diabetes mellitus with renal complication ([place])    Brief Summary: [name] a 68 y.o.malewith medical history significant ofsickle cell anemia (SS disease); ESRDnoton HD; HTN; HLD; DM; and colon CA presenting with nausea and vomiting.  He had a vocal cord lesion removed with CO2 laser excision during microlaryngoscopy on 10/7. He went home and then started vomiting the evening of 10.7. No vomiting prior to surgery. It would come and go. He has been losing a lot of weight and so has been drinking Boost. Unintentional weight loss of about 50 pounds in the last 4-5 months. He started having night sweats recently. No LAD. Slight diarrhea after the surgery, thought it was related to the anesthesia. Normal BMs other than post-operatively. Stools have been black because he has been taking iron. He got 4 units of blood a week or two before the surgery, that increased his Hgb from 2.5 up to 8.9. He has not yet started HD, still making urine.  His ex-wife has been caring for him for the last 3 weeks.  His colon cancer was 20 years ago. He has never had a colonoscopy in over 25 years.  He was last admitted 8/13-15 for symptomatic anemia from CKD and given IV iron and Epogen.  Upon arrival to ED, patient had c/o abdominal pain and vomiting. Had laryngeal biopsy a few days ago. WBC 40k.   CT abd/pelvis> Marked progression of wall thickening in the cecum, now also involving terminal ileum, with progressive regional adenopathy, suggesting progression of colon carcinoma. Cannot exclude superimposed typhlitis given the clinical presentation and regional inflammatory/edematous change.      Hospital Course:  Principal Problem:    Colon cancer with post op ileus - cancer recurrence after about 25 yrs. - has not had a follow up colonoscopy -Patient underwent colonoscopy on 03/18/2019 which showed cecum and ascending colon mass which was biopsied.  - surgical path > poorly differentiated adenocarcinoma.  - cardiology clearance for surgery requested - 10/16- attempt for palliative hemicolectomy but mass was unresectable and thus he was closed back up -had post op ileus which resolved - Oncology states that he is a poor candidate for chemo (which would be palliative) and unless he gains some strength, he cannot receive chemo - the patient and family do not want to transition to comfort care and would like for him to ultimately be strong enough for chemo  - he will be going home with family   Active Problems:     CKD (chronic kidney disease) stage 5, GFR less than 15 ml/min (HCC) - LUA AVF placed  on 10/15- nephrology was following and as renal function remained stable, has signed off     Type 2 diabetes mellitus  - with hypoglycemia due to poor oral intake - A1c 6.9 - holding Onglyza    Anemia due to acute blood loss- Sickle cell disease -Patient's hemoglobin dropped from 7.02> 6.5 on 03/15/2019-  received 1 unit of PRBC transfusion  - Hb dropped to 6.5 so he received  second unit of transfusion on 03/17/2019.      Malnutrition of moderate degree - dietary supplements ordered    Hypertension -cont Norvasc and Coreg  Cocaine abuse in the past (noted on UDS in August)    Discharge Exam: Vitals:   04/03/19 0807 04/03/19 1411  BP: (!) 110/59 (!) 108/94  Pulse: 78 (!) 59  Resp:    Temp:  99.3 F (37.4 C)  SpO2:  (!) 89%   Vitals:   04/02/19 2030 04/03/19 0419 04/03/19 0807 04/03/19 1411  BP: 110/66 133/75 (!) 110/59 (!) 108/94  Pulse: 72 77 78 (!) 59  Resp: 18 18    Temp: 98.9 F (37.2 C) 99.3 F (37.4 C)  99.3 F (37.4 C)  TempSrc: Oral Oral  Oral  SpO2: 90% 90%  (!) 89%  Weight:      Height:        General: Pt is alert, awake, not in acute distress Cardiovascular: RRR, S1/S2 +, no rubs, no gallops Respiratory: CTA bilaterally, no wheezing, no rhonchi Abdominal: Soft, NT, ND, bowel sounds + Extremities: no edema, no cyanosis   Discharge Instructions  Discharge Instructions    Diet general   Complete by: As directed    Take a soft diet which means to avoid food high in fiber.  Take Ensure supplements twice a day.   Increase activity slowly   Complete by: As directed      Allergies as of 04/03/2019      Reactions   Morphine And Related Itching      Medication List    STOP taking these medications   atorvastatin 20 MG tablet Commonly known as: LIPITOR   furosemide 40 MG tablet Commonly known as: LASIX   Onglyza 5 MG Tabs tablet Generic drug: saxagliptin HCl     TAKE these medications   acetaminophen 500 MG tablet Commonly known as: TYLENOL Take 1.5 tablets (750 mg total) by mouth every 6 (six) hours as needed for mild pain, fever or headache (pain). What changed:   how much to take  reasons to take this   amLODipine 5 MG tablet Commonly known as: NORVASC Take 5 mg by mouth daily.   calcitRIOL 0.25 MCG capsule Commonly known as: ROCALTROL Take 0.25 mcg by mouth every other day. In the morning.    calcium acetate 667 MG capsule Commonly known as: PHOSLO Take 667 mg by mouth 3 (three) times daily with meals.   carvedilol 25 MG tablet Commonly known as: COREG Take 25 mg by mouth 2 (two) times daily.   feeding supplement (ENSURE ENLIVE) Liqd Take 237 mLs by mouth 2 (two) times daily between meals.   ferrous sulfate 325 (65 FE) MG tablet Commonly known as: Feosol Take 1 tablet (325 mg total) by mouth 2 (two) times daily with a meal. What changed: when to take this   folic acid 528 MCG tablet Commonly known as: FOLVITE Take 800 mcg by mouth daily.   gabapentin 300 MG capsule Commonly known as: NEURONTIN Take 1 capsule (300 mg total) by mouth at bedtime.  What changed: when to take this   multivitamin with minerals Tabs tablet Take 1 tablet by mouth daily.   NovoLOG FlexPen 100 UNIT/ML FlexPen Generic drug: insulin aspart Inject 2-6 Units into the skin 3 (three) times daily as needed for high blood sugar (CBG >150).   omeprazole 20 MG capsule Commonly known as: PRILOSEC Take 20 mg by mouth daily.   ondansetron 4 MG tablet Commonly known as: ZOFRAN Take 1 tablet (4 mg total) by mouth every 6 (six) hours as needed for nausea.   oxyCODONE 5 MG immediate release tablet Commonly known as: Oxy IR/ROXICODONE Take 1 tablet (5 mg total) by mouth every 4 (four) hours as needed for moderate pain.   Oxycodone HCl 10 MG Tabs Take 1 tablet (10 mg total) by mouth every 4 (four) hours as needed.   polyethylene glycol powder 17 GM/SCOOP powder Commonly known as: MiraLax Take 17 g by mouth daily. Mix in 8 oz liquid and drink What changed: additional instructions   sorbitol 70 % Soln Take 30 mLs by mouth as needed for moderate constipation.   Ventolin HFA 108 (90 Base) MCG/ACT inhaler Generic drug: albuterol Inhale 2 puffs into the lungs every 6 (six) hours as needed for wheezing.      Follow-up Information    [facility] In 2 weeks.   Specialty:  Vascular Surgery Why: Office will call you to arrange your appt (sent) Contact information: [facility], [place]. Go on 04/04/2019.   Specialty: General Surgery Why: at 2:00pm for staple removal. Please arrive 20 minutes prior to complete paperwork. please bring photo ID and insurance card Contact information: [facility] up.   Contact information: [address] [address] [phone]        [facility].   Specialty: PALLIATIVE CARE Contact information: [place] [address] [phone]         Allergies  Allergen Reactions  . Morphine And Related Itching     Procedures/Studies:  Colonoscopy 10/12  AV graft on 10/15  Exploratory lap 10/16  Ct Abdomen Pelvis Wo Contrast  Result Date: 03/15/2019 CLINICAL DATA:  Emesis, right lower quadrant pain, history of colon carcinoma EXAM: CT ABDOMEN AND PELVIS WITHOUT CONTRAST TECHNIQUE: Multidetector CT imaging of the abdomen and pelvis was performed following the standard protocol without IV contrast. COMPARISON:  02/09/2018 and previous FINDINGS: Lower chest: Linear scarring or subsegmental atelectasis posteriorly in the lower lobes as before. No pleural or pericardial effusion. Hepatobiliary: No focal liver abnormality is seen. Status post cholecystectomy. No biliary dilatation. Pancreas: Unremarkable. No pancreatic ductal dilatation or surrounding inflammatory changes. Spleen: Small with coarse calcifications as before. Adrenals/Urinary Tract: Adrenal glands are unremarkable. Kidneys are normal, without renal calculi, focal lesion, or hydronephrosis. Bladder is unremarkable. Stomach/Bowel: Stomach decompressed. Small bowel decompressed. Marked wall thickening involving  the terminal ileum and cecum without obstruction. Mild adjacent inflammatory/edematous changes. Regional adenopathy measured up to 3.2 cm , some nodes containing calcifications. The remainder of the colon is unremarkable. Vascular/Lymphatic: Right lower quadrant and central mesenteric adenopathy as above. No retroperitoneal or pelvic adenopathy. No significant vascular pathology identified. Reproductive: Uterus and bilateral adnexa are unremarkable. Other: Bilateral pelvic phleboliths.  No ascites.  No free air. Musculoskeletal: Extensive mottled lytic/sclerotic appearance of all  visualized bones, probably related to sickle cell disease, present on studies dating back to 11/04/2016. No fracture. IMPRESSION: 1. Marked progression of wall thickening in the cecum, now also involving terminal ileum, with progressive regional adenopathy, suggesting progression of colon carcinoma. Cannot exclude superimposed typhlitis given the clinical presentation and regional inflammatory/edematous change. 2. Chronic changes of sickle cell disease in visualized bones and spleen. Electronically Signed   By: Lucrezia Europe M.D.   On: 03/15/2019 13:59   Ct Chest Wo Contrast  Result Date: 03/27/2019 CLINICAL DATA:  Pt c/o upper midline chest pain; pt denies cough, or SOB. Sickle cell disease. EXAM: CT CHEST WITHOUT CONTRAST TECHNIQUE: Multidetector CT imaging of the chest was performed following the standard protocol without IV contrast. COMPARISON:  CT abdomen 03/15/2019 FINDINGS: Cardiovascular: RIGHT central venous line. No acute cardiac findings. No pericardial fluid. Mediastinum/Nodes: No axillary or supraclavicular adenopathy. No mediastinal hilar adenopathy. No pericardial effusion. Esophagus normal. Lungs/Pleura: New LEFT pleural effusion and basilar atelectasis compared to CT 03/15/2019. Mild RIGHT basilar atelectasis also increased. No focal infiltrate. No pneumothorax. 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 Musculoskeletal: Diffuse sclerosis throughout the entirety of the skeleton consistent sickle cell change. IMPRESSION: 1. New LEFT pleural effusion and basilar atelectasis. No clear evidence pneumonia. 2. Mild atelectasis at the RIGHT lung base is also new. 3. Sequelae of sickle cell disease with infarcted spleen and dense sclerosis of the bones. Electronically Signed   By: Suzy Bouchard M.D.   On: 03/27/2019 09:44   Ir Fluoro Guide Cv Line Right  Result Date: 03/18/2019 INDICATION: 68 year old male referred for central line placement EXAM: IMAGE GUIDED PLACEMENT OF CENTRAL VENOUS CATHETER MEDICATIONS: NONE ANESTHESIA/SEDATION: NONE FLUOROSCOPY TIME:  Fluoroscopy Time: 0 minutes 12 seconds (1 mGy). COMPLICATIONS: NONE PROCEDURE: Informed written consent was obtained from the patient after a thorough discussion of the procedural risks, benefits and alternatives. All questions were addressed. Maximal Sterile Barrier Technique was utilized including caps, mask, sterile gowns, sterile gloves, sterile drape, hand hygiene and skin antiseptic. A timeout was performed prior to the initiation of the procedure. Patient was positioned supine position on fluoroscopy table. The right neck was prepped and draped in the usual sterile fashion. 1% lidocaine was used for local anesthesia. Using ultrasound guidance, micropuncture access was performed at the right IJ. Once we confirmed wire position, we measured and internal length to 2 vertebral bodies below the carina. The catheter was modified on the back table and then placed through the peel-away sheath. Final image was stored. Aspiration was confirmed at the catheter site. Catheter was sutured in position and a sterile bandage was placed. Patient tolerated the procedure well and remained hemodynamically stable throughout. No complications were encountered and no significant blood loss. IMPRESSION: Status post image guided placement of  right IJ central venous catheter. Signed, Dulcy Fanny. Dellia Nims, RPVI [facility] Electronically Signed   By: Corrie Mckusick D.O.   On: 03/18/2019 08:28   Ir US Guide Vasc Access Right  Result Date: 03/18/2019 INDICATION: 68 year old male referred for central line placement EXAM: IMAGE GUIDED PLACEMENT OF CENTRAL VENOUS CATHETER MEDICATIONS: NONE ANESTHESIA/SEDATION: NONE FLUOROSCOPY TIME:  Fluoroscopy Time: 0 minutes 12 seconds (1 mGy). COMPLICATIONS: NONE PROCEDURE: Informed written consent was obtained from the patient after a thorough discussion of the procedural risks, benefits and alternatives. All questions were addressed. Maximal Sterile Barrier Technique was utilized including caps, mask, sterile gowns, sterile gloves, sterile drape, hand  hygiene and skin antiseptic. A timeout was performed prior to the initiation of the procedure. Patient was positioned supine position on fluoroscopy table. The right neck was prepped and draped in the usual sterile fashion. 1% lidocaine was used for local anesthesia. Using ultrasound guidance, micropuncture access was performed at the right IJ. Once we confirmed wire position, we measured and internal length to 2 vertebral bodies below the carina. The catheter was modified on the back table and then placed through the peel-away sheath. Final image was stored. Aspiration was confirmed at the catheter site. Catheter was sutured in position and a sterile bandage was placed. Patient tolerated the procedure well and remained hemodynamically stable throughout. No complications were encountered and no significant blood loss. IMPRESSION: Status post image guided placement of right IJ central venous catheter. Signed, Dulcy Fanny. Dellia Nims, RPVI [facility] Electronically Signed   By: Corrie Mckusick D.O.   On: 03/18/2019 08:28   Dg Abd Portable 1v  Result Date:  03/23/2019 CLINICAL DATA:  NG tube placement; exploratory laparotomy performed 03/21/2019 EXAM: PORTABLE ABDOMEN - 1 VIEW COMPARISON:  CT abdomen pelvis-03/15/2019 FINDINGS: Enteric tube tip and side port project over the left expected location of mid body of the stomach. There is moderate gaseous distension of several loops of small bowel with index loop of small bowel within the right upper abdominal quadrant measuring approximately 3.9 cm in diameter. This finding is associated with a paucity of distal colonic gas within the imaged upper abdomen. Lucency overlying the midline of the upper abdomen likely represents pneumoperitoneum as a sequela of recent postoperative state. Limited visualization of the lower thorax demonstrates bibasilar heterogeneous opacities IMPRESSION: 1. Enteric tube tip and side port project over the expected location of mid body of the stomach. 2. Gaseous distention of the small bowel with paucity of colonic gas within the imaged upper abdomen, potentially representative of ileus though could be seen in the setting of a developing small-bowel obstruction. Clinical correlation is advised. 3. Suspected pneumoperitoneum within the midline of the upper abdomen, presumably the sequela of patient's recent postoperative state. Electronically Signed   By: Sandi Mariscal M.D.   On: 03/23/2019 09:10   [place] Ekg Site Rite  Result Date: 04/01/2019 If Site Rite image not attached, placement could not be confirmed due to current cardiac rhythm.    The results of significant diagnostics from this hospitalization (including imaging, microbiology, ancillary and laboratory) are listed below for reference.     Microbiology: No results found for this or any previous visit (from the past 240 hour(s)).   Labs: BNP (last 3 results) Recent Labs    01/16/19 1820  BNP 1,275.1*   Basic Metabolic Panel: Recent Labs  Lab 03/30/19 0705 03/31/19 0225 04/01/19 0704 04/02/19 0436  NA 136 137 136 135   K 3.8 3.5 4.2 4.2  CL 86* 86* 88* 89*  CO2 37* 39* 36* 35*  GLUCOSE 184* 157* 203* 202*  BUN 23 23 22 21   CREATININE 2.85* 3.07* 3.22* 3.23*  CALCIUM 8.5* 8.6* 8.7* 8.6*  MG 1.9 2.0 2.0 2.1   Liver Function Tests: Recent Labs  Lab 03/30/19 0705 03/31/19 0225 04/01/19 0704 04/02/19 0436  AST 16 18 18 18   ALT 7 8 7 7   ALKPHOS 77 76 78 83  BILITOT 1.0 0.5 0.6 0.7  PROT 5.9* 6.0* 5.7* 6.1*  ALBUMIN 1.9* 1.9* 1.9* 1.9*   No results for input(s): LIPASE, AMYLASE in the last 168 hours. No  results for input(s): AMMONIA in the last 168 hours. CBC: Recent Labs  Lab 03/30/19 0705 03/31/19 0225 04/01/19 0704 04/02/19 0436  WBC 22.2* 24.1* 25.9* 22.5*  HGB 8.1* 8.7* 8.2* 8.6*  HCT 25.1* 26.6* 25.1* 27.3*  MCV 80.2 79.4* 78.9* 81.0  PLT 449* 502* 484* 383   Cardiac Enzymes: No results for input(s): CKTOTAL, CKMB, CKMBINDEX, TROPONINI in the last 168 hours. BNP: Invalid input(s): POCBNP CBG: Recent Labs  Lab 04/02/19 1251 04/02/19 1616 04/02/19 2030 04/03/19 0753 04/03/19 1237  GLUCAP 119* 160* 72 204* 126*   D-Dimer No results for input(s): DDIMER in the last 72 hours. Hgb A1c No results for input(s): HGBA1C in the last 72 hours. Lipid Profile No results for input(s): CHOL, HDL, LDLCALC, TRIG, CHOLHDL, LDLDIRECT in the last 72 hours. Thyroid function studies No results for input(s): TSH, T4TOTAL, T3FREE, THYROIDAB in the last 72 hours.  Invalid input(s): FREET3 Anemia work up No results for input(s): VITAMINB12, FOLATE, FERRITIN, TIBC, IRON, RETICCTPCT in the last 72 hours. Urinalysis    Component Value Date/Time   COLORURINE YELLOW 03/15/2019 1630   APPEARANCEUR CLEAR 03/15/2019 1630   LABSPEC 1.012 03/15/2019 1630   PHURINE 5.0 03/15/2019 1630   GLUCOSEU NEGATIVE 03/15/2019 1630   HGBUR NEGATIVE 03/15/2019 1630   BILIRUBINUR NEGATIVE 03/15/2019 1630   KETONESUR NEGATIVE 03/15/2019 1630   PROTEINUR NEGATIVE 03/15/2019 1630   UROBILINOGEN 1.0 09/06/2014  2203   NITRITE NEGATIVE 03/15/2019 1630   LEUKOCYTESUR NEGATIVE 03/15/2019 1630   Sepsis Labs Invalid input(s): PROCALCITONIN,  WBC,  LACTICIDVEN Microbiology No results found for this or any previous visit (from the past 240 hour(s)).   Time coordinating discharge in minutes: 65  SIGNED:   Debbe Odea, MD  [facility] 04/05/2019, 4:12 PM Pager   If 7PM-7AM, please contact night-coverage [email or web address] Password [email or web address]

## 2019-04-03 NOTE — Progress Notes (Signed)
[facility])  Referral received for palliative care services in the community once discharged.   Spoke with [name], brother and whom the pt will be living with, and scheduled an appointment with our NP for tomorrow at 11 am.  Thank you for this referral.  Venia Carbon RN, BSN, [facility] Liaison (in [place]) [phone]

## 2019-04-03 NOTE — Progress Notes (Signed)
[place]   DOB:[birth year]   ZO#:[id number]    HEME/ONC OVERVIEW: 1. Metastatic poorly differentiated adenocarcinoma of ascending colon -03/2019: ? Marked wall thickening involving the terminal ileum and cecum, regional adenopathy ? Ex lap showed massive R colon tumor replacing the entire cecum and ascending colon up to the hepatic flexure, invades peritoneum and pelvic sidewall, through the small bowel mesentery with studding, c/w carcinomatosis; not operable   PERTINENT NON-HEM/ONC PROBLEMS: 1. Stage IV CKD, not on dialysis (AVG in the LUL created in 03/2019), on ESA by nephrology 2. Recent hx of cocaine abuse (UDS last positive in 01/2019) 3. DM II 4. Sickle cell disease (S-beta thalassemia)  Assessment & Plan:   Metastatic poorly differentiated adenocarcinoma of ascending colon -I have had several discussions with the patient in the past that his performance status is too poor for treatment, even in the palliative setting, and he is in agreeable -At this time, patient is NOT a candidate for any palliative treatment, until he makes some significant improvement in his functional and nutritional status -We can set up a follow-up appt with him in the next 2-3 weeks after discharge to re-assess for any improvement, and determine if he may be a treatment candidate then   Normocytic anemia  -Likely multifactorial, including occult blood loss from colon malignancy, anemia of chronic disease, Stage IV CKD, and sickle/beta thal disease  -S/p IV iron x 1 in the hospital; on ESA by nephrology outpatient  Stage V CKD -Followed by nephrology -AVG placed in LUL on 03/20/2019 -Continue follow-up with nephrology   Goals of care discussion -I again emphasized with the patient that until he makes some significant improvement, both performance and nutrition, he would not be a candidate for any treatment -I encouraged him to continue [place] discussion with his family and palliative care team  -DNR    Oncology is available as needed. We will plan to see the patient in 2-3 weeks after discharge to reassess any interval improvement.   [name], MD 04/03/2019  2:29 PM   Subjective:  Mr. Kollman reports that his abdominal pain is better, and he is able to walk a few more feet than he used to, but he still gets tired easily.  He denies any other complaint this morning.   ROS: Constitutional: ( - ) fevers, ( - )  chills , ( - ) night sweats Ears, nose, mouth, throat, and face: ( - ) mucositis, ( - ) sore throat Respiratory: ( - ) cough, ( - ) dyspnea, ( - ) wheezes Cardiovascular: ( - ) palpitation, ( - ) chest discomfort, ( - ) lower extremity swelling Gastrointestinal:  ( - ) nausea, ( - ) heartburn, ( - ) change in bowel habits Skin: ( - ) abnormal skin rashes Behavioral/Psych: ( - ) mood change, ( - ) new changes  All other systems were reviewed with the patient and are negative.  Objective:  Vitals:   04/03/19 0807 04/03/19 1411  BP: (!) 110/59 (!) 108/94  Pulse: 78 (!) 59  Resp:    Temp:  99.3 F (37.4 C)  SpO2:  (!) 89%     Intake/Output Summary (Last 24 hours) at 04/03/2019 1429 Last data filed at 04/03/2019 0545 Gross per 24 hour  Intake -  Output 200 ml  Net -200 ml    GENERAL: alert, no distress and comfortable SKIN: skin color, texture, turgor are normal, no rashes or significant lesions EYES: conjunctiva are pink and non-injected, sclera clear  OROPHARYNX: no exudate, no erythema; lips, buccal mucosa, and tongue normal  NECK: supple, non-tender LUNGS: clear to auscultation with normal breathing effort HEART: regular rate & rhythm and no murmurs and no lower extremity edema ABDOMEN: soft, non-tender, non-distended, normal bowel sounds, abdominal scar healing well  PSYCH: alert & oriented x 3, fluent speech   Labs:  Lab Results  Component Value Date   WBC 22.5 (H) 04/02/2019   HGB 8.6 (L) 04/02/2019   HCT 27.3 (L) 04/02/2019   MCV 81.0 04/02/2019   PLT  383 04/02/2019   NEUTROABS 21.3 (H) 03/26/2019    Lab Results  Component Value Date   NA 135 04/02/2019   K 4.2 04/02/2019   CL 89 (L) 04/02/2019   CO2 35 (H) 04/02/2019    Studies:  [place] Ekg Site Rite  Result Date: 04/01/2019 If Site Rite image not attached, placement could not be confirmed due to current cardiac rhythm.

## 2019-04-03 NOTE — Care Management Important Message (Signed)
Important Message  Patient Details  Name: [name] MRN: [id number] Date of Birth: 08-11-1950   Medicare Important Message Given:  Yes     Memory Argue 04/03/2019, 2:24 PM

## 2019-04-03 NOTE — Progress Notes (Signed)
Occupational Therapy Treatment Patient Details Name: [name] MRN: [id number] DOB: April 05, 1951 Today's Date: 04/03/2019    History of present illness Pt is a 68 y/o male admitted secondary to n/v and RLQ abdominal pain. Imaging indicates recurrent colon cancer in the cecum/TI. Colonoscopy on 10/13 showed malignant tumor in the ascending colon and in the cecum. path showed poorly differentiated carcinoma. Pt underwent exploratory laparotomy surgery on 03/21/19; however, intraoperative findings were that tumor was unresectable. Consults to oncology and palliative were recommended. PMH including but not limited to colon cancer s/p some type of colon resection in 1982, CKD stage 5, DM, HTN, CHF and cocaine abuse.   OT comments  Pt making steady progress towards OT goals this session. Session focus on functional mobility and seated ADLs. Pt on RA at start of session. Pt complete functional mobility from EOB>recliner with min guard assist wtith RW. Pt noted to de-sat to 88%; applied O2 with sats rebounding to 96%. Pt complete functional mobility in hallway with RW on 1L O2 with sats > 90% throughout. Pt complete UB/LB dressing from recliner with set-up/ supervision. Discussed ADL routine at home with pt able to recall 1 energy conservation strategy to incorporate into ADL routine at home. Pt likely to DC home today with HHOT; will continue to follow acutely per POC.    Follow Up Recommendations  Home health OT;Supervision/Assistance - 24 hour    Equipment Recommendations  None recommended by OT    Recommendations for Other Services      Precautions / Restrictions Precautions Precautions: Fall Precaution Comments: abdominal pain Restrictions Weight Bearing Restrictions: No       Mobility Bed Mobility Overal bed mobility: Needs Assistance Bed Mobility: Supine to Sit     Supine to sit: Supervision     General bed mobility comments: practiced bed mobility from flat bed at home; pt MOD  I for supine>sit; pt states he sits straight up at home, moves to EOB and then scoots into lift chair which is positioned at end of bed, education on completing sit>stand from EOB as pt capable of performing safe sit>stand from EOB with RW  Transfers Overall transfer level: Needs assistance Equipment used: Rolling walker (2 wheeled) Transfers: Sit to/from Stand Sit to Stand: Supervision         General transfer comment: supervision for safety    Balance Overall balance assessment: Needs assistance Sitting-balance support: Feet supported Sitting balance-Leahy Scale: Normal Sitting balance - Comments: able to don<>doff socks from EOB   Standing balance support: Bilateral upper extremity supported Standing balance-Leahy Scale: Fair Standing balance comment: reliance on B UE support, able to maintain static balance without B UE support                           ADL either performed or assessed with clinical judgement   ADL Overall ADL's : Needs assistance/impaired                 Upper Body Dressing : Set up;Sitting   Lower Body Dressing: Supervision/safety;Set up;Sitting/lateral leans Lower Body Dressing Details (indicate cue type and reason): able to don socks from EOB Toilet Transfer: Ambulation;RW;Min guard Toilet Transfer Details (indicate cue type and reason): simulated to recliner with MIN guard for safety         Functional mobility during ADLs: Min guard;Rolling walker General ADL Comments: pt complete functional mobillty from EOB>recliner; pt supervision for LB ADL; min guard functional mobility with RW;  pt on RA at start of session, de-sat from EOB>recliner to 88% O2 rebounded to 96% after applying 1L O2     Vision Patient Visual Report: No change from baseline     Perception     Praxis      Cognition Arousal/Alertness: Awake/alert Behavior During Therapy: WFL for tasks assessed/performed Overall Cognitive Status: No family/caregiver  present to determine baseline cognitive functioning Area of Impairment: Problem solving;Following commands                       Following Commands: Follows one step commands with increased time;Follows one step commands consistently     Problem Solving: Slow processing;Decreased initiation;Requires verbal cues General Comments: pt with difficulty understanding O2 sats; explained to pt why I was applying O2 with pt stating that "this room doesn't get good O2"; pt requires increased time to problem solve, requires verbal cues and overall slow to process.        Exercises     Shoulder Instructions       General Comments      Pertinent Vitals/ Pain       Pain Assessment: No/denies pain  Home Living                                          Prior Functioning/Environment              Frequency  Min 2X/week        Progress Toward Goals  OT Goals(current goals can now be found in the care plan section)  Progress towards OT goals: Progressing toward goals  Acute Rehab OT Goals Patient Stated Goal: to find out what else can be done to treat his cancer OT Goal Formulation: With patient Time For Goal Achievement: 04/07/19 Potential to Achieve Goals: [place] Discharge plan remains appropriate    Co-evaluation                 AM-PAC OT "6 Clicks" Daily Activity     Outcome Measure   Help from another person eating meals?: None Help from another person taking care of personal grooming?: A Little Help from another person toileting, which includes using toliet, bedpan, or urinal?: A Little Help from another person bathing (including washing, rinsing, drying)?: A Little Help from another person to put on and taking off regular upper body clothing?: None Help from another person to put on and taking off regular lower body clothing?: A Little 6 Click Score: 20    End of Session Equipment Utilized During Treatment: Rolling  walker;Oxygen;Other (comment)(1L O2 for functional mobility in hallway)  OT Visit Diagnosis: Unsteadiness on feet (R26.81);Other abnormalities of gait and mobility (R26.89);Pain;Muscle weakness (generalized) (M62.81)   Activity Tolerance Patient tolerated treatment well   Patient Left in chair;with call bell/phone within reach;with nursing/sitter in room   Nurse Communication          Time: 0981-1914 OT Time Calculation (min): 33 min  Charges: OT General Charges $OT Visit: 1 Visit OT Treatments $Self Care/Home Management : 23-37 mins  Lanier Clam., COTA/L Acute Rehabilitation Services [phone] [place] 04/03/2019, 3:04 PM

## 2019-04-03 NOTE — TOC Transition Note (Addendum)
Transition of Care [facility]) - CM/SW Discharge Note   Patient Details  Name: [name] MRN: [id number] Date of Birth: 14-Jun-1950  [facility] CM/SW Contact:  Marilu Favre, RN Phone Number: 04/03/2019, 12:36 PM   Clinical Narrative:     Meet with patient again. Patient still has not decided on home health agency . Asked if NCM could call family / friend. Patient consented to call [name] called left message, Asked if NCM could call [name] or [name] and patient stated NO. Patient consented for NCM to call [name].   Called [name] and placed her on speaker phone with her and patient's permission. [name] stated the plan is patient will stay with his brother [name] at discharge. Patient agreed. Also [name] will go to patient's home and get patient's walker and 3 in 1 to take to his home. Also, [name] will pick patient up at discharge. Asked for [facility] phone number to confirm. [id number] [phone]. [name] wants NCM to call her back after speaking with [name].  Patient consented . Called [name] placed he on speaker phone with permission. Confirmed all of above. [name] address is [address] [phone].   [name] will go and get patient's DME. When patient ready to be discharged , [name] requesting bedside nurse to call him and he will come pick up patient.   Patient and [name] have no home health preference. Referral given to and accepted by [facility] with [facility].  Palliative Care consent to [facility]  Final next level of care: [place] Barriers to Discharge: No Barriers Identified   Patient Goals and CMS Choice Patient states their goals for this hospitalization and ongoing recovery are:: to go home CMS Medicare.gov Compare Post Acute Care list provided to:: Patient Choice offered to / list presented to : Patient  Discharge Placement                       Discharge Plan and Services In-house Referral: Clinical Social  Work Discharge Planning Services: CM Consult Post Acute Care Choice: Home Health          DME Arranged: N/A         HH Arranged: RN, PT, OT, Nurse's Aide, Social Work          Social Determinants of Health (SDOH) Interventions     Readmission Risk Interventions Readmission Risk Prevention Plan 03/30/2019 03/30/2019 03/19/2019  Transportation Screening Complete Complete Complete  PCP or Specialist Appt within 3-5 Days - - -  HRI or [place] for [place] - - -  Medication Review [employer]) Complete Complete Complete  PCP or Specialist appointment within 3-5 days of discharge Complete Complete Complete  HRI or Home Care Consult Complete Complete Complete  SW Recovery Care/Counseling Consult Complete Complete Complete  Palliative Care Screening Complete Complete Not Applicable  Skilled Nursing Facility Complete Complete Complete  Some recent data might be hidden

## 2019-04-03 NOTE — Progress Notes (Signed)
PALLIATIVE NOTE:  Patient awake, A&O x4. Denies pain or shortness of breath. States he slept well during the night with no complaints. Just finished up breakfast and ordered lunch. No family at the bedside.   Mr. Bankson states he is supposed to discharge home today since SNF was denied. He shares his excitement to be going home soon. States his family is making arrangements and working on final plans. I attempted to call [name] and [name] as he requested. No answer messages left. He is aware that we will make sure Palliative outpatient follows to assist family with support and needs in the event they wish to transition to hospice later. He verbalized understanding and appreciation of support.   Awake, A&O x3, NAD RRR CTA bilaterally Moves all ext. Mood appropriate  Plan -DNR -home with family and outpatient palliative support (request [facility])  Thank you for allowing the Palliative Medicine Team to assist in the care of this patient.  Total Time: 20 min.   Greater than 50%  of this time was spent counseling and coordinating care related to the above assessment and plan.  Alda Lea, AGPCNP-BC Palliative Medicine Team

## 2019-04-03 NOTE — Progress Notes (Signed)
PALLIATIVE NOTE:  Received a message to call son, [name] back for follow up. I was able to speak with patient's son [name] who expressed his concerns regarding his father being discharged today and his unawareness that patient had been denied for SNF. He is tearful in expressing he and his siblings were upset as they had been working on a Plan B in case this occurred but was overwhelmed with quickness of discharge and not being included in the meeting to discuss discharge today.   I reviewed with [name] that there was not a meeting held today regarding discharge and apologies that he was unable to speak with someone regarding disposition, noting that I had called earlier and left a message with intentions to provide updates from a palliative standpoint. He verbalized understanding.   He expressed the now current plan based on information he received was that patient was going to discharge home around 3 with his brother [name] and would receive home health for 3 days. He is concerned regarding palliative involvement. I explained to him that outpatient palliative was not limited to 3 days and would continue to support patient and family as long as desired. He verbalized understanding.   [name] expressed he is in preparation of speaking with his uncle and making arrangements to see if he needed to get off work to assist in getting his father situation in the home, because he did not think his uncle would be capable of doing this independently. I commended [name] for being their for his father and collaborating with family to make arrangements for a safe discharge and care for his father. I recommended family continue to be his main support and work to make the best of Mr. Signer's situation once he is discharged and again reminded him that at any point patient and family feels appropriate that they may discuss transition of care with their outpatient palliative team. He verbalized understanding and  appreciation of continued support during his father's hospitalization.   I also spoke with Nira Conn, CM/SW TOC contact and provided her updates of son's concerns which now seemed resolved. She verbalized she also spoke with daughter [name] at the bedside.   Alda Lea, AGPCNP-BC Palliative Medicine Team   NO CHARGE

## 2019-04-04 ENCOUNTER — Other Ambulatory Visit: Payer: Medicare HMO | Admitting: Hospice

## 2019-04-04 ENCOUNTER — Other Ambulatory Visit: Payer: Self-pay

## 2019-04-04 DIAGNOSIS — Z515 Encounter for palliative care: Secondary | ICD-10-CM

## 2019-04-04 NOTE — Progress Notes (Signed)
[employer] Palliative Care Consult Note Telephone: [phone]  Fax: [phone]  PATIENT NAME: [name] DOB: 23-Aug-1950 MRN: [id number]  PRIMARY CARE PROVIDER:   Nolene Ebbs, MD  REFERRING PROVIDER:  Nolene Ebbs, MD [place],   [id number]  RESPONSIBLE PARTY:   Self Emergency Contact: SonJarriel Papillion [phone]- Brother  TELEHEALTH VISIT STATEMENT Due to the COVID-19 crisis, this visit was done via telephone from my office. It was initiated and consented to by this patient and/or family. RECOMMENDATIONS/PLAN:   Advance Care Planning/Goals of Care: Visit consisted of building trust and discussions on Palliative Medicine as specialized medical care for people living with serious illness, aimed at facilitating advance care plan, symptoms relief and establishing goals of care. Educated on importance of advance directive, code status, end of life wishes with chronic progressive disease.  Patient said he is realistic with his expectations considering his comorbidities. He affirmed he is a DNR but does not have the form at home; DNR signed today and put in the mail for patient. His goal of care include to maximize quality of life and symptom management. He is open to further discussions on goals of care clarification and Hospice services down the line.  Symptom management: Patient denied pain/discomfort, no hematuria or hematochezia, changes in bowel habit or unintentional weight loss related to his Colon CA. He said no radiation or chemotherapy at this time Chronic Renal insufficiency: Arteriovenuous graft to [facility] 03/20/2019. Patient sad he had same graft 3 years ago but was never used for dialysis. It is now replaced but no specified date for dialysis. He plans to see PCP in 2 weeks.  Follow up: Palliative care will continue to follow patient for goals of care clarification and symptom management  I spent 60  minutes providing this consultation, from 11am to 12pm. More than 50% of the time in this consultation was spent on coordinating advance care communication. HISTORY OF PRESENT ILLNESS:  [name] is a 68 y.o. year old male with multiple medical problems including Chronic renal insufficiency- stage 5 with no dialysis, Colon CA, HTN. Palliative Care was asked to help address goals of care.   CODE STATUS: DNR  PPS: 40% HOSPICE ELIGIBILITY/DIAGNOSIS: TBD  PAST MEDICAL HISTORY:  Past Medical History:  Diagnosis Date  . Arthritis   . Cancer ([place])    colon  . Cataracts, bilateral    surgery to remove  . Diabetes mellitus    type 2  . GERD (gastroesophageal reflux disease)   . Heart murmur   . History of kidney stones    passed stone, no surgery  . Hyperlipidemia   . Hypertension   . Lesion of vocal cord   . Pneumonia 2019   x 2  . Renal insufficiency    no dialysis - stagte 5  . Sickle cell anemia (HCC)    Hgb SS disease   . Wears glasses     SOCIAL HX:  Social History   Tobacco Use  . Smoking status: Former Smoker    Packs/day: 0.50    Years: 20.00    Pack years: 10.00    Types: Cigarettes    Quit date: 06/05/1966    Years since quitting: 52.8  . Smokeless tobacco: Never Used  Substance Use Topics  . Alcohol use: Yes    Comment: rare beer, last use maybe July    ALLERGIES:  Allergies  Allergen Reactions  . Morphine And Related Itching  PERTINENT MEDICATIONS:  Outpatient Encounter Medications as of 04/04/2019  Medication Sig  . acetaminophen (TYLENOL) 500 MG tablet Take 1.5 tablets (750 mg total) by mouth every 6 (six) hours as needed for mild pain, fever or headache (pain).  Marland Kitchen amLODipine (NORVASC) 5 MG tablet Take 5 mg by mouth daily.  . calcitRIOL (ROCALTROL) 0.25 MCG capsule Take 0.25 mcg by mouth every other day. In the morning.  . calcium acetate (PHOSLO) 667 MG capsule Take 667 mg by mouth 3 (three) times daily with meals.  . carvedilol (COREG) 25 MG  tablet Take 25 mg by mouth 2 (two) times daily.  . feeding supplement, ENSURE ENLIVE, (ENSURE ENLIVE) LIQD Take 237 mLs by mouth 2 (two) times daily between meals.  . ferrous sulfate (FEOSOL) 325 (65 FE) MG tablet Take 1 tablet (325 mg total) by mouth 2 (two) times daily with a meal. (Patient taking differently: Take 325 mg by mouth 2 (two) times daily. )  . folic acid (FOLVITE) 488 MCG tablet Take 800 mcg by mouth daily.  Marland Kitchen gabapentin (NEURONTIN) 300 MG capsule Take 1 capsule (300 mg total) by mouth at bedtime. (Patient taking differently: Take 300 mg by mouth 3 (three) times daily. )  . insulin aspart (NOVOLOG FLEXPEN) 100 UNIT/ML FlexPen Inject 2-6 Units into the skin 3 (three) times daily as needed for high blood sugar (CBG >150).  . Multiple Vitamin (MULTIVITAMIN WITH MINERALS) TABS tablet Take 1 tablet by mouth daily.  Marland Kitchen omeprazole (PRILOSEC) 20 MG capsule Take 20 mg by mouth daily.   . ondansetron (ZOFRAN) 4 MG tablet Take 1 tablet (4 mg total) by mouth every 6 (six) hours as needed for nausea.  Marland Kitchen oxyCODONE (OXY IR/ROXICODONE) 5 MG immediate release tablet Take 1 tablet (5 mg total) by mouth every 4 (four) hours as needed for moderate pain.  . Oxycodone HCl 10 MG TABS Take 1 tablet (10 mg total) by mouth every 4 (four) hours as needed.  . polyethylene glycol powder (MIRALAX) 17 GM/SCOOP powder Take 17 g by mouth daily. Mix in 8 oz liquid and drink  . sorbitol 70 % SOLN Take 30 mLs by mouth as needed for moderate constipation.  . VENTOLIN HFA 108 (90 Base) MCG/ACT inhaler Inhale 2 puffs into the lungs every 6 (six) hours as needed for wheezing.    No facility-administered encounter medications on file as of 04/04/2019.     Teodoro Spray, NP

## 2019-04-05 ENCOUNTER — Inpatient Hospital Stay (HOSPITAL_COMMUNITY)
Admission: EM | Admit: 2019-04-05 | Discharge: 2019-04-07 | DRG: 871 | Disposition: A | Discharge status: Hospice | Payer: Medicare HMO | Attending: Internal Medicine | Admitting: Internal Medicine

## 2019-04-05 ENCOUNTER — Telehealth: Payer: Self-pay | Admitting: Primary Care

## 2019-04-05 ENCOUNTER — Emergency Department (HOSPITAL_COMMUNITY): Payer: Medicare HMO

## 2019-04-05 ENCOUNTER — Encounter (HOSPITAL_COMMUNITY): Payer: Self-pay

## 2019-04-05 ENCOUNTER — Other Ambulatory Visit: Payer: Self-pay

## 2019-04-05 DIAGNOSIS — R198 Other specified symptoms and signs involving the digestive system and abdomen: Secondary | ICD-10-CM

## 2019-04-05 DIAGNOSIS — E1122 Type 2 diabetes mellitus with diabetic chronic kidney disease: Secondary | ICD-10-CM | POA: Diagnosis present

## 2019-04-05 DIAGNOSIS — C180 Malignant neoplasm of cecum: Secondary | ICD-10-CM | POA: Diagnosis present

## 2019-04-05 DIAGNOSIS — I10 Essential (primary) hypertension: Secondary | ICD-10-CM | POA: Diagnosis not present

## 2019-04-05 DIAGNOSIS — Z9841 Cataract extraction status, right eye: Secondary | ICD-10-CM

## 2019-04-05 DIAGNOSIS — C786 Secondary malignant neoplasm of retroperitoneum and peritoneum: Secondary | ICD-10-CM | POA: Diagnosis present

## 2019-04-05 DIAGNOSIS — N186 End stage renal disease: Secondary | ICD-10-CM | POA: Diagnosis present

## 2019-04-05 DIAGNOSIS — Z20828 Contact with and (suspected) exposure to other viral communicable diseases: Secondary | ICD-10-CM | POA: Diagnosis present

## 2019-04-05 DIAGNOSIS — K631 Perforation of intestine (nontraumatic): Secondary | ICD-10-CM | POA: Diagnosis present

## 2019-04-05 DIAGNOSIS — I5032 Chronic diastolic (congestive) heart failure: Secondary | ICD-10-CM | POA: Diagnosis present

## 2019-04-05 DIAGNOSIS — E1129 Type 2 diabetes mellitus with other diabetic kidney complication: Secondary | ICD-10-CM | POA: Diagnosis present

## 2019-04-05 DIAGNOSIS — Z9049 Acquired absence of other specified parts of digestive tract: Secondary | ICD-10-CM

## 2019-04-05 DIAGNOSIS — Z9842 Cataract extraction status, left eye: Secondary | ICD-10-CM

## 2019-04-05 DIAGNOSIS — Z794 Long term (current) use of insulin: Secondary | ICD-10-CM

## 2019-04-05 DIAGNOSIS — C182 Malignant neoplasm of ascending colon: Secondary | ICD-10-CM | POA: Diagnosis present

## 2019-04-05 DIAGNOSIS — E1152 Type 2 diabetes mellitus with diabetic peripheral angiopathy with gangrene: Secondary | ICD-10-CM | POA: Diagnosis present

## 2019-04-05 DIAGNOSIS — N185 Chronic kidney disease, stage 5: Secondary | ICD-10-CM | POA: Diagnosis present

## 2019-04-05 DIAGNOSIS — D571 Sickle-cell disease without crisis: Secondary | ICD-10-CM | POA: Diagnosis present

## 2019-04-05 DIAGNOSIS — Z79899 Other long term (current) drug therapy: Secondary | ICD-10-CM | POA: Diagnosis not present

## 2019-04-05 DIAGNOSIS — I132 Hypertensive heart and chronic kidney disease with heart failure and with stage 5 chronic kidney disease, or end stage renal disease: Secondary | ICD-10-CM | POA: Diagnosis present

## 2019-04-05 DIAGNOSIS — Z66 Do not resuscitate: Secondary | ICD-10-CM | POA: Diagnosis present

## 2019-04-05 DIAGNOSIS — Z808 Family history of malignant neoplasm of other organs or systems: Secondary | ICD-10-CM

## 2019-04-05 DIAGNOSIS — Z961 Presence of intraocular lens: Secondary | ICD-10-CM | POA: Diagnosis present

## 2019-04-05 DIAGNOSIS — A414 Sepsis due to anaerobes: Principal | ICD-10-CM | POA: Diagnosis present

## 2019-04-05 DIAGNOSIS — Z85038 Personal history of other malignant neoplasm of large intestine: Secondary | ICD-10-CM

## 2019-04-05 DIAGNOSIS — E785 Hyperlipidemia, unspecified: Secondary | ICD-10-CM | POA: Diagnosis present

## 2019-04-05 DIAGNOSIS — C189 Malignant neoplasm of colon, unspecified: Secondary | ICD-10-CM | POA: Diagnosis present

## 2019-04-05 DIAGNOSIS — Z833 Family history of diabetes mellitus: Secondary | ICD-10-CM

## 2019-04-05 DIAGNOSIS — J9601 Acute respiratory failure with hypoxia: Secondary | ICD-10-CM | POA: Diagnosis present

## 2019-04-05 DIAGNOSIS — Z87891 Personal history of nicotine dependence: Secondary | ICD-10-CM | POA: Diagnosis not present

## 2019-04-05 DIAGNOSIS — Z515 Encounter for palliative care: Secondary | ICD-10-CM

## 2019-04-05 DIAGNOSIS — K219 Gastro-esophageal reflux disease without esophagitis: Secondary | ICD-10-CM | POA: Diagnosis present

## 2019-04-05 DIAGNOSIS — C188 Malignant neoplasm of overlapping sites of colon: Secondary | ICD-10-CM | POA: Diagnosis not present

## 2019-04-05 DIAGNOSIS — Z87442 Personal history of urinary calculi: Secondary | ICD-10-CM

## 2019-04-05 DIAGNOSIS — R509 Fever, unspecified: Secondary | ICD-10-CM | POA: Diagnosis not present

## 2019-04-05 DIAGNOSIS — Z79891 Long term (current) use of opiate analgesic: Secondary | ICD-10-CM

## 2019-04-05 DIAGNOSIS — Z885 Allergy status to narcotic agent status: Secondary | ICD-10-CM

## 2019-04-05 DIAGNOSIS — R188 Other ascites: Secondary | ICD-10-CM | POA: Diagnosis present

## 2019-04-05 DIAGNOSIS — I509 Heart failure, unspecified: Secondary | ICD-10-CM

## 2019-04-05 LAB — CBC WITH DIFFERENTIAL/PLATELET
Abs Immature Granulocytes: 0 10*3/uL (ref 0.00–0.07)
Basophils Absolute: 0.4 10*3/uL — ABNORMAL HIGH (ref 0.0–0.1)
Basophils Relative: 1 %
Eosinophils Absolute: 0 10*3/uL (ref 0.0–0.5)
Eosinophils Relative: 0 %
HCT: 24.8 % — ABNORMAL LOW (ref 39.0–52.0)
Hemoglobin: 8.1 g/dL — ABNORMAL LOW (ref 13.0–17.0)
Lymphocytes Relative: 6 %
Lymphs Abs: 2.5 10*3/uL (ref 0.7–4.0)
MCH: 25.5 pg — ABNORMAL LOW (ref 26.0–34.0)
MCHC: 32.7 g/dL (ref 30.0–36.0)
MCV: 78 fL — ABNORMAL LOW (ref 80.0–100.0)
Monocytes Absolute: 2.1 10*3/uL — ABNORMAL HIGH (ref 0.1–1.0)
Monocytes Relative: 5 %
Neutro Abs: 36.5 10*3/uL — ABNORMAL HIGH (ref 1.7–7.7)
Neutrophils Relative %: 88 %
Platelets: 385 10*3/uL (ref 150–400)
RBC: 3.18 MIL/uL — ABNORMAL LOW (ref 4.22–5.81)
RDW: 23.2 % — ABNORMAL HIGH (ref 11.5–15.5)
WBC: 41.5 10*3/uL — ABNORMAL HIGH (ref 4.0–10.5)
nRBC: 0 % (ref 0.0–0.2)
nRBC: 0 /100 WBC

## 2019-04-05 LAB — COMPREHENSIVE METABOLIC PANEL
ALT: 8 U/L (ref 0–44)
AST: 21 U/L (ref 15–41)
Albumin: 1.9 g/dL — ABNORMAL LOW (ref 3.5–5.0)
Alkaline Phosphatase: 84 U/L (ref 38–126)
Anion gap: 11 (ref 5–15)
BUN: 35 mg/dL — ABNORMAL HIGH (ref 8–23)
CO2: 32 mmol/L (ref 22–32)
Calcium: 8.5 mg/dL — ABNORMAL LOW (ref 8.9–10.3)
Chloride: 89 mmol/L — ABNORMAL LOW (ref 98–111)
Creatinine, Ser: 3.94 mg/dL — ABNORMAL HIGH (ref 0.61–1.24)
GFR calc Af Amer: 17 mL/min — ABNORMAL LOW (ref >60)
GFR calc non Af Amer: 14 mL/min — ABNORMAL LOW (ref >60)
Glucose, Bld: 208 mg/dL — ABNORMAL HIGH (ref 70–99)
Potassium: 5.1 mmol/L (ref 3.5–5.1)
Sodium: 132 mmol/L — ABNORMAL LOW (ref 135–145)
Total Bilirubin: 0.9 mg/dL (ref 0.3–1.2)
Total Protein: 6 g/dL — ABNORMAL LOW (ref 6.5–8.1)

## 2019-04-05 LAB — URINALYSIS, ROUTINE W REFLEX MICROSCOPIC
Bacteria, UA: NONE SEEN
Bilirubin Urine: NEGATIVE
Glucose, UA: NEGATIVE mg/dL
Hgb urine dipstick: NEGATIVE
Ketones, ur: NEGATIVE mg/dL
Leukocytes,Ua: NEGATIVE
Nitrite: NEGATIVE
Protein, ur: 30 mg/dL — AB
Specific Gravity, Urine: 1.01 (ref 1.005–1.030)
pH: 8 (ref 5.0–8.0)

## 2019-04-05 LAB — LACTIC ACID, PLASMA
Lactic Acid, Venous: 1.1 mmol/L (ref 0.5–1.9)
Lactic Acid, Venous: 1 mmol/L (ref 0.5–1.9)

## 2019-04-05 LAB — PROTIME-INR
INR: 1.3 — ABNORMAL HIGH (ref 0.8–1.2)
Prothrombin Time: 16 seconds — ABNORMAL HIGH (ref 11.4–15.2)

## 2019-04-05 LAB — APTT: aPTT: 34 seconds (ref 24–36)

## 2019-04-05 MED ORDER — METRONIDAZOLE IN NACL 5-0.79 MG/ML-% IV SOLN
500.0000 mg | Freq: Once | INTRAVENOUS | Status: AC
  Administered 2019-04-05: 22:00:00 500 mg via INTRAVENOUS
  Filled 2019-04-05: qty 100

## 2019-04-05 MED ORDER — HYDROMORPHONE HCL 1 MG/ML IJ SOLN
0.5000 mg | INTRAMUSCULAR | Status: DC
  Administered 2019-04-06 (×3): 0.5 mg via INTRAVENOUS
  Filled 2019-04-05 (×2): qty 0.5
  Filled 2019-04-05: qty 1

## 2019-04-05 MED ORDER — ONDANSETRON HCL 4 MG PO TABS: 4.0000 mg | ORAL_TABLET | Freq: Four times a day (QID) | ORAL | Status: DC | PRN

## 2019-04-05 MED ORDER — PANTOPRAZOLE SODIUM 40 MG PO TBEC: 40.0000 mg | DELAYED_RELEASE_TABLET | Freq: Every day | ORAL | Status: DC

## 2019-04-05 MED ORDER — AMLODIPINE BESYLATE 5 MG PO TABS: 5.0000 mg | ORAL_TABLET | Freq: Every day | ORAL | Status: DC

## 2019-04-05 MED ORDER — ACETAMINOPHEN 325 MG PO TABS: 650.0000 mg | ORAL_TABLET | Freq: Four times a day (QID) | ORAL | Status: DC | PRN

## 2019-04-05 MED ORDER — GABAPENTIN 300 MG PO CAPS: 300.0000 mg | ORAL_CAPSULE | Freq: Three times a day (TID) | ORAL | Status: DC

## 2019-04-05 MED ORDER — ACETAMINOPHEN 325 MG PO TABS
650.0000 mg | ORAL_TABLET | Freq: Once | ORAL | Status: AC
  Administered 2019-04-05: 22:00:00 650 mg via ORAL
  Filled 2019-04-05: qty 2

## 2019-04-05 MED ORDER — INSULIN ASPART 100 UNIT/ML ~~LOC~~ SOLN
0.0000 [IU] | Freq: Three times a day (TID) | SUBCUTANEOUS | Status: DC
  Administered 2019-04-06 – 2019-04-07 (×2): 1 [IU] via SUBCUTANEOUS

## 2019-04-05 MED ORDER — VANCOMYCIN HCL 10 G IV SOLR
1250.0000 mg | Freq: Once | INTRAVENOUS | Status: AC
  Administered 2019-04-05: 1250 mg via INTRAVENOUS
  Filled 2019-04-05: qty 1250

## 2019-04-05 MED ORDER — ACETAMINOPHEN 650 MG RE SUPP: 325.0000 mg | RECTAL | Status: DC | PRN

## 2019-04-05 MED ORDER — POLYETHYLENE GLYCOL 3350 17 G PO PACK: 17.0000 g | PACK | Freq: Every day | ORAL | Status: DC

## 2019-04-05 MED ORDER — SODIUM CHLORIDE 0.9 % IV SOLN
500.0000 mg | Freq: Once | INTRAVENOUS | Status: AC
  Administered 2019-04-05: 19:00:00 500 mg via INTRAVENOUS
  Filled 2019-04-05: qty 500

## 2019-04-05 MED ORDER — SODIUM CHLORIDE 0.9 % IV SOLN
2.0000 g | INTRAVENOUS | Status: DC
  Administered 2019-04-06: 2 g via INTRAVENOUS
  Filled 2019-04-05 (×2): qty 2

## 2019-04-05 MED ORDER — ENOXAPARIN SODIUM 30 MG/0.3ML ~~LOC~~ SOLN
30.0000 mg | SUBCUTANEOUS | Status: DC
  Administered 2019-04-06 – 2019-04-07 (×2): 30 mg via SUBCUTANEOUS
  Filled 2019-04-05 (×2): qty 0.3

## 2019-04-05 MED ORDER — CALCITRIOL 0.25 MCG PO CAPS: 0.2500 ug | ORAL_CAPSULE | ORAL | Status: DC

## 2019-04-05 MED ORDER — ENSURE ENLIVE PO LIQD: 237.0000 mL | Freq: Two times a day (BID) | ORAL | Status: DC

## 2019-04-05 MED ORDER — VANCOMYCIN HCL IN DEXTROSE 1-5 GM/200ML-% IV SOLN: 1000.0000 mg | Freq: Once | INTRAVENOUS | Status: DC

## 2019-04-05 MED ORDER — ALBUTEROL SULFATE (2.5 MG/3ML) 0.083% IN NEBU: 3.0000 mL | INHALATION_SOLUTION | Freq: Four times a day (QID) | RESPIRATORY_TRACT | Status: DC | PRN

## 2019-04-05 MED ORDER — OXYCODONE HCL 5 MG PO TABS: 5.0000 mg | ORAL_TABLET | ORAL | Status: DC | PRN

## 2019-04-05 MED ORDER — VANCOMYCIN VARIABLE DOSE PER UNSTABLE RENAL FUNCTION (PHARMACIST DOSING): Status: DC

## 2019-04-05 MED ORDER — ONDANSETRON 4 MG PO TBDP: 4.0000 mg | ORAL_TABLET | Freq: Four times a day (QID) | ORAL | Status: DC | PRN

## 2019-04-05 MED ORDER — CARVEDILOL 25 MG PO TABS
25.0000 mg | ORAL_TABLET | Freq: Two times a day (BID) | ORAL | Status: DC
  Filled 2019-04-05: qty 1

## 2019-04-05 MED ORDER — CALCIUM ACETATE (PHOS BINDER) 667 MG PO CAPS: 667.0000 mg | ORAL_CAPSULE | Freq: Three times a day (TID) | ORAL | Status: DC

## 2019-04-05 MED ORDER — SODIUM CHLORIDE 0.45 % IV SOLN
INTRAVENOUS | Status: DC
  Administered 2019-04-06 (×2): via INTRAVENOUS

## 2019-04-05 MED ORDER — SODIUM CHLORIDE 0.9 % IV SOLN
2.0000 g | Freq: Once | INTRAVENOUS | Status: AC
  Administered 2019-04-05: 19:00:00 2 g via INTRAVENOUS
  Filled 2019-04-05: qty 2

## 2019-04-05 NOTE — ED Notes (Signed)
Placed pt on 2L 02 per [place]. 

## 2019-04-05 NOTE — ED Triage Notes (Signed)
Pt arrived via POV from home, pt has had fever of 102 at home, weakness and loss appetite started this morning. Pt has stage 4 colon CA. Pt had abdominal surgery to attempt to remove colon, but CA too far advanced. The lower part of pt's abdominal incision is dehisced. Pt is A&Ox4. Pt is NSR w/BBB on monitor.

## 2019-04-05 NOTE — ED Notes (Signed)
Patient transported to CT 

## 2019-04-05 NOTE — ED Provider Notes (Addendum)
[facility] EMERGENCY DEPARTMENT Provider Note   CSN: [id number] Arrival date & time: 04/05/19  1757     History   Chief Complaint Chief Complaint  Patient presents with   Fever    HPI [name] is a 68 y.o. male.     HPI  68 year old comes in a chief complaint of fevers. Patient has history of diabetes, ESRD, advanced colon cancer with peritoneal metastases.   According to the patient's son, patient was just discharged on Thursday.  He had a good day yesterday but today he started having weakness.  He was noted to have a fever and they spoke with the palliative nurse who advised that they come to the ER.  Patient is complaining of some abdominal discomfort.  Son noted some purulent drainage by the surgical incision site.  Patient was also noted to be hypoxic per EMS when he arrived.  Patient reports that he has a cough but he does not have any chest pain or shortness of breath.  Past Medical History:  Diagnosis Date   Arthritis    Cancer ([place])    colon   Cataracts, bilateral    surgery to remove   Diabetes mellitus    type 2   GERD (gastroesophageal reflux disease)    Heart murmur    History of kidney stones    passed stone, no surgery   Hyperlipidemia    Hypertension    Lesion of vocal cord    Pneumonia 2019   x 2   Renal insufficiency    no dialysis - stagte 5   Sickle cell anemia (HCC)    Hgb SS disease    Wears glasses     Patient Active Problem List   Diagnosis Date Noted   Perforated abdominal viscus 04/05/2019   Malnutrition of moderate degree 03/29/2019   Thrombocytosis ([place])    Goals of care, counseling/discussion    Malignant neoplasm of cecum ([place])    Colon cancer ([place]) 03/15/2019   Anemia 03/15/2019   Congestive heart failure (CHF) ([place]) 01/17/2019   Acute on chronic diastolic CHF (congestive heart failure) ([place]) 01/16/2019   Hyponatremia 01/16/2019   Sepsis ([place]) 02/08/2018   Opiate  overdose ([place]) 11/13/2017   Acute urinary obstruction 03/29/2017   Symptomatic anemia    Hb-SS disease with crisis ([place]) 03/27/2017   Sickle cell pain crisis ([place]) 01/23/2017   Heart murmur 03/27/2016   CKD (chronic kidney disease) stage 5, GFR less than 15 ml/min (HCC) 03/26/2016   Cocaine abuse ([place]) 03/25/2016   Renal failure (ARF), acute on chronic (HCC)    Hb-SS disease without crisis ([place])    Hyperkalemia, diminished renal excretion 09/03/2014   Leukocytosis 07/28/2011   LBBB (left bundle branch block) 07/28/2011   Hypertension    Type 2 diabetes mellitus with renal complication [facility])     Past Surgical History:  Procedure Laterality Date   AV FISTULA PLACEMENT Left 11/22/2016   Procedure: INSERTION OF ARTERIOVENOUS (AV) GORE-TEX GRAFT  LEFT FOREARM USING 6MM X 50CM GORETEX GRAFT;  Surgeon: Serafina Mitchell, MD;  Location: [facility] OR;  Service: Vascular;  Laterality: Left;   AV FISTULA PLACEMENT Left 03/20/2019   Procedure: INSERTION OF ARTERIOVENOUS (AV) GORE-TEX GRAFT LEFT UPPER ARM;  Surgeon: Rosetta Posner, MD;  Location: [facility];  Service: Vascular;  Laterality: Left;   BIOPSY  03/18/2019   Procedure: BIOPSY;  Surgeon: Gatha Mayer, MD;  Location: [facility] ENDOSCOPY;  Service: Endoscopy;;   CATARACT EXTRACTION  W/ INTRAOCULAR LENS  IMPLANT, BILATERAL     CHOLECYSTECTOMY     CO2 LASER APPLICATION N/A 37/06/624   Procedure: CO2 LASER EXCISION OF VOCAL FOLD LESION;  Surgeon: Melida Quitter, MD;  Location: [facility];  Service: ENT;  Laterality: N/A;   COLON SURGERY     Colon cancer surgery, removed small amt not full colectomy   COLONOSCOPY     nclear who follows him for this   COLONOSCOPY WITH PROPOFOL N/A 03/18/2019   Procedure: COLONOSCOPY WITH PROPOFOL;  Surgeon: Gatha Mayer, MD;  Location: [facility];  Service: Endoscopy;  Laterality: N/A;   DIRECT LARYNGOSCOPY N/A 03/12/2019   Procedure: SUSPENDED MICRO DIRECT LARYNGOSCOPY;  Surgeon: Melida Quitter, MD;   Location: [facility];  Service: ENT;  Laterality: N/A;   IR FLUORO GUIDE CV LINE RIGHT  03/17/2019   IR US GUIDE VASC ACCESS RIGHT  03/17/2019   LAPAROTOMY N/A 03/21/2019   Procedure: EXPLORATORY LAPAROTOMY;  Surgeon: Ileana Roup, MD;  Location: [facility];  Service: General;  Laterality: N/A;   LEG SURGERY     For ? gangrene after running into barbwire fence at 68yo   MULTIPLE TOOTH EXTRACTIONS     no dentures        Home Medications    Prior to Admission medications   Medication Sig Start Date End Date Taking? Authorizing Provider  acetaminophen (TYLENOL) 500 MG tablet Take 1.5 tablets (750 mg total) by mouth every 6 (six) hours as needed for mild pain, fever or headache (pain). 04/03/19   Debbe Odea, MD  amLODipine (NORVASC) 5 MG tablet Take 5 mg by mouth daily. 10/04/17   [provider]  calcitRIOL (ROCALTROL) 0.25 MCG capsule Take 0.25 mcg by mouth every other day. In the morning. 04/25/18   [provider]  calcium acetate (PHOSLO) 667 MG capsule Take 667 mg by mouth 3 (three) times daily with meals.    [provider]  carvedilol (COREG) 25 MG tablet Take 25 mg by mouth 2 (two) times daily. 09/04/17   [provider]  feeding supplement, ENSURE ENLIVE, (ENSURE ENLIVE) LIQD Take 237 mLs by mouth 2 (two) times daily between meals. 04/03/19   Debbe Odea, MD  ferrous sulfate (FEOSOL) 325 (65 FE) MG tablet Take 1 tablet (325 mg total) by mouth 2 (two) times daily with a meal. Patient taking differently: Take 325 mg by mouth 2 (two) times daily.  01/18/19   Monica Becton, MD  folic acid (FOLVITE) 948 MCG tablet Take 800 mcg by mouth daily.    [provider]  gabapentin (NEURONTIN) 300 MG capsule Take 1 capsule (300 mg total) by mouth at bedtime. Patient taking differently: Take 300 mg by mouth 3 (three) times daily.  01/18/19   Monica Becton, MD  insulin aspart (NOVOLOG FLEXPEN) 100 UNIT/ML FlexPen Inject 2-6 Units into the  skin 3 (three) times daily as needed for high blood sugar (CBG >150).    [provider]  Multiple Vitamin (MULTIVITAMIN WITH MINERALS) TABS tablet Take 1 tablet by mouth daily. 04/04/19   Debbe Odea, MD  omeprazole (PRILOSEC) 20 MG capsule Take 20 mg by mouth daily.     [provider]  ondansetron (ZOFRAN) 4 MG tablet Take 1 tablet (4 mg total) by mouth every 6 (six) hours as needed for nausea. 04/03/19   Debbe Odea, MD  oxyCODONE (OXY IR/ROXICODONE) 5 MG immediate release tablet Take 1 tablet (5 mg total) by mouth every 4 (four) hours as needed for  moderate pain. 04/03/19   Debbe Odea, MD  Oxycodone HCl 10 MG TABS Take 1 tablet (10 mg total) by mouth every 4 (four) hours as needed. 04/03/19   Debbe Odea, MD  polyethylene glycol powder (MIRALAX) 17 GM/SCOOP powder Take 17 g by mouth daily. Mix in 8 oz liquid and drink 04/03/19   Debbe Odea, MD  sorbitol 70 % SOLN Take 30 mLs by mouth as needed for moderate constipation. 04/03/19   Debbe Odea, MD  VENTOLIN HFA 108 (90 Base) MCG/ACT inhaler Inhale 2 puffs into the lungs every 6 (six) hours as needed for wheezing.  09/03/17   [provider]    Family History Family History  Problem Relation Age of Onset   Venous thrombosis Mother        Dec. of blood clot    Diabetes type II Mother    AAA (abdominal aortic aneurysm) Mother    Throat cancer Father        Deceased of throat cancer    Social History Social History   Tobacco Use   Smoking status: Former Smoker    Packs/day: 0.50    Years: 20.00    Pack years: 10.00    Types: Cigarettes    Quit date: 06/05/1966    Years since quitting: 52.8   Smokeless tobacco: Never Used  Substance Use Topics   Alcohol use: Yes    Comment: rare beer, last use maybe July   Drug use: Not Currently    Types: Cocaine, "Crack" cocaine    Comment: Last use July 2020     Allergies   Morphine and related   Review of Systems Review of Systems    Constitutional: Positive for activity change and fever.  Respiratory: Positive for cough.   Gastrointestinal: Positive for abdominal pain.  Allergic/Immunologic: Negative for immunocompromised state.  Hematological: Does not bruise/bleed easily.  All other systems reviewed and are negative.    Physical Exam Updated Vital Signs BP 115/64    Pulse 88    Temp 99.6 F (37.6 C) (Oral)    Resp 17    Ht 6' (1.829 m)    Wt 60.3 kg    SpO2 96%    BMI 18.04 kg/m   Physical Exam Vitals signs and nursing note reviewed.  Constitutional:      Appearance: He is well-developed.  HENT:     Head: Atraumatic.  Neck:     Musculoskeletal: Neck supple.  Cardiovascular:     Rate and Rhythm: Normal rate.  Pulmonary:     Effort: Pulmonary effort is normal.     Breath sounds: No wheezing.  Abdominal:     General: There is distension.     Tenderness: There is abdominal tenderness. There is no guarding or rebound.     Comments: Surgical incision site has slight dehiscence, however there is no purulent drainage appreciated.  Skin:    General: Skin is warm.     Coloration: Jaundice: RAD.  Neurological:     Mental Status: He is alert and oriented to person, place, and time.      ED Treatments / Results  Labs (all labs ordered are listed, but only abnormal results are displayed) Labs Reviewed  COMPREHENSIVE METABOLIC PANEL - Abnormal; Notable for the following components:      Result Value   Sodium 132 (*)    Chloride 89 (*)    Glucose, Bld 208 (*)    BUN 35 (*)    Creatinine, Ser 3.94 (*)  Calcium 8.5 (*)    Total Protein 6.0 (*)    Albumin 1.9 (*)    GFR calc non Af Amer 14 (*)    GFR calc Af Amer 17 (*)    All other components within normal limits  CBC WITH DIFFERENTIAL/PLATELET - Abnormal; Notable for the following components:   WBC 41.5 (*)    RBC 3.18 (*)    Hemoglobin 8.1 (*)    HCT 24.8 (*)    MCV 78.0 (*)    MCH 25.5 (*)    RDW 23.2 (*)    Neutro Abs 36.5 (*)     Monocytes Absolute 2.1 (*)    Basophils Absolute 0.4 (*)    All other components within normal limits  URINALYSIS, ROUTINE W REFLEX MICROSCOPIC - Abnormal; Notable for the following components:   Protein, ur 30 (*)    All other components within normal limits  PROTIME-INR - Abnormal; Notable for the following components:   Prothrombin Time 16.0 (*)    INR 1.3 (*)    All other components within normal limits  CULTURE, BLOOD (ROUTINE X 2)  CULTURE, BLOOD (ROUTINE X 2)  URINE CULTURE  SARS CORONAVIRUS 2 (TAT 6-24 HRS)  LACTIC ACID, PLASMA  LACTIC ACID, PLASMA  APTT    EKG None ED ECG REPORT   Date: 04/05/2019  Rate: 92  Rhythm: indeterminate  QRS Axis: Right axis deviation  Intervals: normal  ST/T Wave abnormalities: nonspecific ST/T changes  Conduction Disutrbances:nonspecific intraventricular conduction delay  Narrative Interpretation:   Old EKG Reviewed: unchanged  I have personally reviewed the EKG tracing and agree with the computerized printout as noted.    Radiology Ct Abdomen Pelvis Wo Contrast  Result Date: 04/05/2019 CLINICAL DATA:  Abdominal pain and fever. Colon carcinoma. Sickle cell disease. EXAM: CT ABDOMEN AND PELVIS WITHOUT CONTRAST TECHNIQUE: Multidetector CT imaging of the abdomen and pelvis was performed following the standard protocol without IV contrast. COMPARISON:  03/15/2019 FINDINGS: Lower chest: Small bilateral pleural effusions and mild bibasilar atelectasis. Hepatobiliary: No mass visualized on this unenhanced exam. Prior cholecystectomy. No evidence of biliary obstruction. Pancreas: No mass or inflammatory process visualized on this unenhanced exam. Spleen: Stable small calcified spleen, consistent with auto splenectomy. Adrenals/Urinary tract: No evidence of urolithiasis or hydronephrosis. Unremarkable unopacified urinary bladder. Stomach/Bowel: A small amount of free intraperitoneal air is seen, consistent with bowel perforation. A bulky soft  tissue mass is again seen involving the ascending colon, consistent with colon carcinoma. Increased diffuse mesenteric and peritoneal soft tissue stranding and mild ascites may be due to peritonitis or peritoneal carcinomatosis. Several right pericolonic soft tissue masses or lymphadenopathy are seen, largest one anteriorly measuring 5.8 cm compared to 4.4 cm previously, and showing involvement of anterior abdominal wall soft tissues. Mildly dilated small bowel loops are similar to previous study, consistent with low-grade bowel obstruction. Vascular/Lymphatic: Right-sided pericolonic and mesenteric lymphadenopathy, consistent with metastatic disease no evidence of abdominal aortic aneurysm. Reproductive:  No mass or other significant abnormality. Other:  None. Musculoskeletal: No suspicious bone lesions identified. Diffuse osteosclerosis, consistent with sickle cell disease. IMPRESSION: Small amount of free intraperitoneal air, consistent with bowel perforation. Increased diffuse mesenteric and peritoneal soft tissue stranding and mild ascites, which may be due to peritonitis or peritoneal carcinomatosis. Bulky right-sided colonic mass, consistent with known colon carcinoma. Increased size of right pericolonic and mesenteric soft tissue masses or lymphadenopathy, consistent with metastatic disease. Low-grade bowel obstruction, without significant change. New small bilateral pleural effusions and bibasilar atelectasis. Critical Value/emergent  results were called by telephone at the time of interpretation on 04/05/2019 at 9:28 pm to provider [facility] , who verbally acknowledged these results. Electronically Signed   By: Marlaine Hind M.D.   On: 04/05/2019 21:29   Dg Chest Port 1 View  Result Date: 04/05/2019 CLINICAL DATA:  68 year old male with fever. EXAM: PORTABLE CHEST 1 VIEW COMPARISON:  Chest radiograph dated 01/16/2019 FINDINGS: Mild cardiomegaly and mild vascular congestion. No edema. Trace left  pleural effusion suspected. No focal consolidation or pneumothorax. Sclerotic changes of the humeral head and spine sequela of sickle cell. No acute osseous pathology. IMPRESSION: 1. Cardiomegaly with mild vascular congestion. No focal consolidation. 2. Probable trace left pleural effusion. Electronically Signed   By: Anner Crete M.D.   On: 04/05/2019 20:08    Procedures .Critical Care Performed by: Varney Biles, MD Authorized by: Varney Biles, MD   Critical care provider statement:    Critical care time (minutes):  110   Critical care was necessary to treat or prevent imminent or life-threatening deterioration of the following conditions:  Sepsis   Critical care was time spent personally by me on the following activities:  Discussions with consultants, evaluation of patient's response to treatment, examination of patient, ordering and performing treatments and interventions, ordering and review of laboratory studies, ordering and review of radiographic studies, pulse oximetry, re-evaluation of patient's condition, obtaining history from patient or surrogate and review of old charts  Ultrasound ED Peripheral IV (Provider)  Date/Time: 04/06/2019 7:39 PM Performed by: Varney Biles, MD Authorized by: Varney Biles, MD   Procedure details:    Indications: hydration and poor IV access     Skin Prep: chlorhexidine gluconate     Location: right external jugular.   Angiocath:  18 G   Bedside Ultrasound Guided: Yes     Images: not archived     Patient tolerated procedure without complications: No     Dressing applied: Yes     (including critical care time)  Medications Ordered in ED Medications  vancomycin variable dose per unstable renal function (pharmacist dosing) (has no administration in time range)  ceFEPIme (MAXIPIME) 2 g in sodium chloride 0.9 % 100 mL IVPB (has no administration in time range)  acetaminophen (TYLENOL) tablet 650 mg (has no administration in time  range)  oxyCODONE (Oxy IR/ROXICODONE) immediate release tablet 5 mg (has no administration in time range)  amLODipine (NORVASC) tablet 5 mg (has no administration in time range)  carvedilol (COREG) tablet 25 mg (has no administration in time range)  calcitRIOL (ROCALTROL) capsule 0.25 mcg (has no administration in time range)  calcium acetate (PHOSLO) capsule 667 mg (has no administration in time range)  pantoprazole (PROTONIX) EC tablet 40 mg (has no administration in time range)  ondansetron (ZOFRAN) tablet 4 mg (has no administration in time range)  polyethylene glycol (MIRALAX / GLYCOLAX) packet 17 g (has no administration in time range)  gabapentin (NEURONTIN) capsule 300 mg (has no administration in time range)  feeding supplement (ENSURE ENLIVE) (ENSURE ENLIVE) liquid 237 mL (has no administration in time range)  albuterol (PROVENTIL) (2.5 MG/3ML) 0.083% nebulizer solution 3 mL (has no administration in time range)  0.45 % sodium chloride infusion (has no administration in time range)  enoxaparin (LOVENOX) injection 30 mg (has no administration in time range)  insulin aspart (novoLOG) injection 0-9 Units (has no administration in time range)  ceFEPIme (MAXIPIME) 2 g in sodium chloride 0.9 % 100 mL IVPB (0 g Intravenous Stopped 04/05/19 2013)  azithromycin (ZITHROMAX) 500 mg in sodium chloride 0.9 % 250 mL IVPB (0 mg Intravenous Stopped 04/05/19 2029)  vancomycin (VANCOCIN) 1,250 mg in sodium chloride 0.9 % 250 mL IVPB (0 mg Intravenous Stopped 04/05/19 2210)  acetaminophen (TYLENOL) tablet 650 mg (650 mg Oral Given 04/05/19 2133)  metroNIDAZOLE (FLAGYL) IVPB 500 mg (0 mg Intravenous Stopped 04/05/19 2307)     Initial Impression / Assessment and Plan / ED Course  I have reviewed the triage vital signs and the nursing notes.  Pertinent labs & imaging results that were available during my care of the patient were reviewed by me and considered in my medical decision making (see chart for  details).  Clinical Course as of Apr 04 2329  Sat Apr 05, 2019  2325 White count is significantly elevated.  Labs show acute on chronic renal failure.  WBC(!): 41.5 [AN]  2326 X-rays not showing any signs of pneumonia.  He has CKD and not a candidate for CT PE.  The hypoxia could be because of underlying PE.  DG Chest Port 1 View [AN]  2328 CT scan shows perforated viscus with free air.  I spoke with Dr. Georgette Dover, general surgery.  He informed me that patient has very complex cancer and that he is not a candidate for surgery.  I discussed the findings with the patient and also called his son and went over the results.  He understands that he will be getting antibiotics which may or may not help.  I have informed the patient and the son that perhaps it is a good idea to discuss hospice again given that he has a new life-threatening problem in addition to the existing comorbidity burden that he already carries.  Son is in agreement with the plan of getting palliative involved again there for the consult has been replaced.  Patient understands what is going on but is frustrated with new findings and different opinions from people.  CT ABDOMEN PELVIS WO CONTRAST [AN]    Clinical Course User Index [AN] Varney Biles, MD       68 year old comes in a chief complaint of abdominal pain and fevers.  He has a complex history, with recent admission for severe anemia found to have advanced colon cancer.  Patient actually was taken to the OR for palliative purposes and after the incision was made the surgeons determined that patient was not even a candidate for palliation.  Oncology is also seen him in deemed him not an ideal candidate for chemo.  Patient was just discharged 2 days ago and was doing well yesterday but today came down with weakness and fever.  His O2 sats were 89% on room air, and is requiring 2 L of oxygen.  He is also complaining of abdominal distention, and son allegedly saw some purulent  drainage by the surgical incision site.  On exam the incision site itself looks fine.  He does have abdominal distention with generalized tenderness.  Lung exam is clear.  Differential diagnosis includes pneumonia, peritonitis, large pleural effusion, abdominal wall abscess.  X-ray of the chest along with sepsis work-up initiated.    Final Clinical Impressions(s) / ED Diagnoses   Final diagnoses:  Perforated abdominal viscus  Acute respiratory failure with hypoxia [facility])    ED Discharge Orders    None       Varney Biles, MD 04/05/19 5053    Varney Biles, MD 04/06/19 1939

## 2019-04-05 NOTE — Telephone Encounter (Signed)
T/c from family that patient is feeling badly, fever of 102 F and some shaking that may be rigors. Patient has not eaten today and complains of malaise. Patient was d/c from [facility] 48 hours ago. Goal of care is to recover from the infection he had in the hospital and be able to be stronger to take more cancer treatments. I offered hospice admission as well if their goals had changed but the family stated they wanted to attempt treatment of this fever. I recommended they go back to [facility] ED for assessment. I also phoned ED to inform of  patient's return. Spoke with Nurse Deneise Lever in ED. I also suggested they call inpatient Palliative if the MD would like to refer.

## 2019-04-05 NOTE — Progress Notes (Signed)
Pharmacy Antibiotic Note  [name] is a 67 y.o. male admitted on 04/05/2019 with pneumonia.  Pharmacy has been consulted for vancomycin and cefepime dosing.  Presented with weakness and loss of appetite - lower part of abdominal incision is dehisced. WBC 41.5, LA 1.1, temp 102.9, Scr 3.94 (CrCl 14.9 mL/min - not yet on HD).   Plan: Cefepime 2g IV every 24 hours  Vancomycin 1250 mg IV once then dose by level given renal function Monitor renal function/nephro plans, cx results, clinical pic, and vanc level as indicated   Height: 6' (182.9 cm) Weight: 133 lb (60.3 kg) IBW/kg (Calculated) : 77.6  Temp (24hrs), Avg:103 F (39.4 C), Min:103 F (39.4 C), Max:103 F (39.4 C)  Recent Labs  Lab 03/30/19 0705 03/31/19 0225 04/01/19 0704 04/02/19 0436  WBC 22.2* 24.1* 25.9* 22.5*  CREATININE 2.85* 3.07* 3.22* 3.23*    Estimated Creatinine Clearance: 18.2 mL/min (A) (by C-G formula based on SCr of 3.23 mg/dL (H)).    Allergies  Allergen Reactions  . Morphine And Related Itching    Antimicrobials this admission: Vancomycin 10/31 >>  Cefepime 10/31 >>  Azithromycin 10/31>>  Dose adjustments this admission: N/A  Microbiology results: 10/31 BCx: sent 10/31 UCx: sent  10/31 COVID: sent   Thank you for allowing pharmacy to be a part of this patient's care.  Antonietta Jewel, PharmD, BCCCP Clinical Pharmacist  Phone: [phone]  Please check AMION for all [place] phone numbers After 10:00 PM, call [place] [phone] 04/05/2019 7:02 PM

## 2019-04-06 DIAGNOSIS — C188 Malignant neoplasm of overlapping sites of colon: Secondary | ICD-10-CM

## 2019-04-06 DIAGNOSIS — Z515 Encounter for palliative care: Secondary | ICD-10-CM

## 2019-04-06 DIAGNOSIS — I10 Essential (primary) hypertension: Secondary | ICD-10-CM

## 2019-04-06 DIAGNOSIS — Z794 Long term (current) use of insulin: Secondary | ICD-10-CM

## 2019-04-06 DIAGNOSIS — R198 Other specified symptoms and signs involving the digestive system and abdomen: Secondary | ICD-10-CM

## 2019-04-06 DIAGNOSIS — E1122 Type 2 diabetes mellitus with diabetic chronic kidney disease: Secondary | ICD-10-CM

## 2019-04-06 DIAGNOSIS — N185 Chronic kidney disease, stage 5: Secondary | ICD-10-CM

## 2019-04-06 LAB — GLUCOSE, CAPILLARY
Glucose-Capillary: 148 mg/dL — ABNORMAL HIGH (ref 70–99)
Glucose-Capillary: 102 mg/dL — ABNORMAL HIGH (ref 70–99)
Glucose-Capillary: 114 mg/dL — ABNORMAL HIGH (ref 70–99)
Glucose-Capillary: 141 mg/dL — ABNORMAL HIGH (ref 70–99)

## 2019-04-06 LAB — SARS CORONAVIRUS 2 (TAT 6-24 HRS): SARS Coronavirus 2: NEGATIVE

## 2019-04-06 MED ORDER — INFLUENZA VAC A&B SA ADJ QUAD 0.5 ML IM PRSY
0.5000 mL | PREFILLED_SYRINGE | INTRAMUSCULAR | Status: DC
  Filled 2019-04-06: qty 0.5

## 2019-04-06 MED ORDER — HYDROMORPHONE HCL 1 MG/ML IJ SOLN
0.5000 mg | INTRAMUSCULAR | Status: DC | PRN
  Administered 2019-04-06 (×2): 1 mg via INTRAVENOUS
  Administered 2019-04-07 (×3): 0.5 mg via INTRAVENOUS
  Filled 2019-04-06 (×2): qty 0.5
  Filled 2019-04-06: qty 1
  Filled 2019-04-06: qty 0.5
  Filled 2019-04-06: qty 1

## 2019-04-06 MED ORDER — HYDROMORPHONE HCL 1 MG/ML IJ SOLN
1.0000 mg | INTRAMUSCULAR | Status: DC
  Administered 2019-04-06: 1 mg via INTRAVENOUS
  Filled 2019-04-06: qty 1

## 2019-04-06 NOTE — Progress Notes (Signed)
PROGRESS NOTE    [name]  ZOX:[id number] DOB: December 08, 1950 DOA: 04/05/2019 PCP: Nolene Ebbs, MD   Brief Narrative:  [name] is a 68 y.o. male with medical history significant of hospital medical problems including diabetes CKD lipidemia, hypertension.  He had cancer of the colon 25 years ago.  He did well until recently where he had profound weight loss, anemia and weakness.  His evaluation revealed him to have widely metastatic colon cancer on laparoscopic exam and he was unresectable.  Patient was discharged from the hospital 04/03/2019.  He is hopeful to regain enough strength to be a candidate for chemotherapy for his colon cancer.  He was seen at home by hospice and at this time is a DNR.  Patient had increasing weakness, developed significant abdominal pain and fever.  For these symptoms he presented to the [facility] emergency department for evaluation.  ED Course: Laboratory revealed the patient had a white count of 41,500.  Hemoglobin 8.5 g which is stable for this patient.  Creatinine 3.94 also stable for this patient.  CT abdomen and pelvis revealed increased bulky tumor, free air in the peritoneum consistent with perforation of intestinal viscus.  Chest x-ray without infiltrates.  Patient was started on vancomycin and cefepime.  General surgery was called who felt they had nothing to offer to this patient who had recent laparotomy which revealed unresectable colon cancer.  He would not be a candidate for any surgery at this point in time.  He was subsequently admitted under hospitalist service.  Assessment & Plan:   Active Problems:   Hypertension   Type 2 diabetes mellitus with renal complication (HCC)   Hb-SS disease without crisis ([place])   CKD (chronic kidney disease) stage 5, GFR less than 15 ml/min (HCC)   Congestive heart failure (CHF) (HCC)   Colon cancer ([place])   Perforated abdominal viscus   1.    Sepsis secondary to perforated abdominal viscus -with  extensive colon cancer now with a probable perforated viscus: Patient continues to have abdominal pain.  He is on Dilaudid 0.5 mg which I will increase to 1 mg every 4 hours.  He is not a surgical candidate per general surgery consultation.  He was started on antibiotics, I doubt this will help improving however I will continue this.   Per oncology, he is not a chemotherapy candidate due to his weakness and comorbidities.  2 type 2 diabetes mellitus: Continue SSI.  Blood sugar controlled.  3.  Essential hypertension: Controlled.  Continue as needed IV hydralazine.  4.  End-of-life care -admitting hospitalist had a long discussion with patient and patient's son about the grave prognosis of patient.  After long discussion, they all mutually agreed to follow comfort care only.  Palliative care was consulted, pending evaluation.  Continue Dilaudid as needed.  DVT prophylaxis: Lovenox Code Status: DNR Family Communication:  None present at bedside.  Although patient in pain but he is alert and oriented.  Plan of care discussed with patient. Disposition Plan: TBD.  Estimated body mass index is 18.04 kg/m as calculated from the following:   Height as of this encounter: 6' (1.829 m).   Weight as of this encounter: 60.3 kg.      Nutritional status:               Consultants:   Palliative care  Procedures:   None  Antimicrobials:   Cefepime and vancomycin   Subjective: Seen and examined.  Continues to complain of  pain, slightly better than yesterday.  5 out of 10.  No other complaint.  Objective: Vitals:   04/05/19 2315 04/06/19 0045 04/06/19 0714 04/06/19 1213  BP:   (!) 102/50 120/66  Pulse:   76 82  Resp: 17 15 16 20   Temp:   99 F (37.2 C)   TempSrc:   Oral   SpO2:   99% 99%  Weight:      Height:        Intake/Output Summary (Last 24 hours) at 04/06/2019 1407 Last data filed at 04/06/2019 1300 Gross per 24 hour  Intake 695 ml  Output 326 ml  Net 369 ml    Filed Weights   04/05/19 1818  Weight: 60.3 kg    Examination:  General exam: Appears in pain Respiratory system: Diminished breath sounds at the bases. Respiratory effort normal. Cardiovascular system: S1 & S2 heard, RRR. No JVD, murmurs, rubs, gallops or clicks. No pedal edema. Gastrointestinal system: Abdomen is firm and generalized tender.  No organomegaly or masses felt. Normal bowel sounds heard. Central nervous system: Alert and oriented. No focal neurological deficits. Extremities: Symmetric 5 x 5 power. Skin: No rashes, lesions or ulcers   Data Reviewed: I have personally reviewed following labs and imaging studies  CBC: Recent Labs  Lab 03/31/19 0225 04/01/19 0704 04/02/19 0436 04/05/19 1845  WBC 24.1* 25.9* 22.5* 41.5*  NEUTROABS  --   --   --  36.5*  HGB 8.7* 8.2* 8.6* 8.1*  HCT 26.6* 25.1* 27.3* 24.8*  MCV 79.4* 78.9* 81.0 78.0*  PLT 502* 484* 383 026   Basic Metabolic Panel: Recent Labs  Lab 03/31/19 0225 04/01/19 0704 04/02/19 0436 04/05/19 1845  NA 137 136 135 132*  K 3.5 4.2 4.2 5.1  CL 86* 88* 89* 89*  CO2 39* 36* 35* 32  GLUCOSE 157* 203* 202* 208*  BUN 23 22 21  35*  CREATININE 3.07* 3.22* 3.23* 3.94*  CALCIUM 8.6* 8.7* 8.6* 8.5*  MG 2.0 2.0 2.1  --    GFR: Estimated Creatinine Clearance: 14.9 mL/min (A) (by C-G formula based on SCr of 3.94 mg/dL (H)). Liver Function Tests: Recent Labs  Lab 03/31/19 0225 04/01/19 0704 04/02/19 0436 04/05/19 1845  AST 18 18 18 21   ALT 8 7 7 8   ALKPHOS 76 78 83 84  BILITOT 0.5 0.6 0.7 0.9  PROT 6.0* 5.7* 6.1* 6.0*  ALBUMIN 1.9* 1.9* 1.9* 1.9*   No results for input(s): LIPASE, AMYLASE in the last 168 hours. No results for input(s): AMMONIA in the last 168 hours. Coagulation Profile: Recent Labs  Lab 04/05/19 1845  INR 1.3*   Cardiac Enzymes: No results for input(s): CKTOTAL, CKMB, CKMBINDEX, TROPONINI in the last 168 hours. BNP (last 3 results) No results for input(s): PROBNP in the last  8760 hours. HbA1C: No results for input(s): HGBA1C in the last 72 hours. CBG: Recent Labs  Lab 04/02/19 2030 04/03/19 0753 04/03/19 1237 04/06/19 0711 04/06/19 1216  GLUCAP 72 204* 126* 148* 102*   Lipid Profile: No results for input(s): CHOL, HDL, LDLCALC, TRIG, CHOLHDL, LDLDIRECT in the last 72 hours. Thyroid Function Tests: No results for input(s): TSH, T4TOTAL, FREET4, T3FREE, THYROIDAB in the last 72 hours. Anemia Panel: No results for input(s): VITAMINB12, FOLATE, FERRITIN, TIBC, IRON, RETICCTPCT in the last 72 hours. Sepsis Labs: Recent Labs  Lab 04/05/19 1845 04/05/19 2136  LATICACIDVEN 1.1 1.0    Recent Results (from the past 240 hour(s))  Culture, blood (Routine x 2)  Status: None (Preliminary result)   Collection Time: 04/05/19  6:45 PM   Specimen: BLOOD RIGHT ARM  Result Value Ref Range Status   Specimen Description BLOOD RIGHT ARM  Final   Special Requests   Final    BOTTLES DRAWN AEROBIC AND ANAEROBIC Blood Culture adequate volume   Culture  Setup Time   Final    ANAEROBIC BOTTLE ONLY GRAM VARIABLE ROD CRITICAL RESULT CALLED TO, READ BACK BY AND VERIFIED WITH: L SEAY PHARMD 04/06/19 2330 JDW    Culture   Final    NO GROWTH < 12 HOURS Performed at [facility], [place] [address], [place], [place] [address]    Report Status PENDING  Incomplete  Culture, blood (Routine x 2)     Status: None (Preliminary result)   Collection Time: 04/05/19  6:45 PM   Specimen: BLOOD RIGHT ARM  Result Value Ref Range Status   Specimen Description BLOOD RIGHT ARM  Final   Special Requests   Final    BOTTLES DRAWN AEROBIC AND ANAEROBIC Blood Culture adequate volume   Culture  Setup Time   Final    ANAEROBIC BOTTLE ONLY GRAM VARIABLE ROD CRITICAL VALUE NOTED.  VALUE IS CONSISTENT WITH PREVIOUSLY REPORTED AND CALLED VALUE.    Culture   Final    NO GROWTH < 12 HOURS Performed at [facility], [place] [address] [place], [place] [address]    Report Status PENDING   Incomplete  SARS CORONAVIRUS 2 (TAT 6-24 HRS) Nasopharyngeal Nasopharyngeal Swab     Status: None   Collection Time: 04/05/19  8:20 PM   Specimen: Nasopharyngeal Swab  Result Value Ref Range Status   SARS Coronavirus 2 NEGATIVE NEGATIVE Final    Comment: (NOTE) SARS-CoV-2 target nucleic acids are NOT DETECTED. The SARS-CoV-2 RNA is generally detectable in upper and lower respiratory specimens during the acute phase of infection. Negative results do not preclude SARS-CoV-2 infection, do not rule out co-infections with other pathogens, and should not be used as the sole basis for treatment or other patient management decisions. Negative results must be combined with clinical observations, patient history, and epidemiological information. The expected result is Negative. Fact Sheet for Patients: SugarRoll.be Fact Sheet for Healthcare Providers: https://www.woods-mathews.com/ This test is not yet approved or cleared by the [place] FDA and  has been authorized for detection and/or diagnosis of SARS-CoV-2 by FDA under an Emergency Use Authorization (EUA). This EUA will remain  in effect (meaning this test can be used) for the duration of the COVID-19 declaration under Section 56 4(b)(1) of the Act, 21 U.S.C. section 360bbb-3(b)(1), unless the authorization is terminated or revoked sooner. Performed at [facility], [place] [address], [place], [place] [address]       Radiology Studies: Ct Abdomen Pelvis Wo Contrast  Result Date: 04/05/2019 CLINICAL DATA:  Abdominal pain and fever. Colon carcinoma. Sickle cell disease. EXAM: CT ABDOMEN AND PELVIS WITHOUT CONTRAST TECHNIQUE: Multidetector CT imaging of the abdomen and pelvis was performed following the standard protocol without IV contrast. COMPARISON:  03/15/2019 FINDINGS: Lower chest: Small bilateral pleural effusions and mild bibasilar atelectasis. Hepatobiliary: No mass visualized on  this unenhanced exam. Prior cholecystectomy. No evidence of biliary obstruction. Pancreas: No mass or inflammatory process visualized on this unenhanced exam. Spleen: Stable small calcified spleen, consistent with auto splenectomy. Adrenals/Urinary tract: No evidence of urolithiasis or hydronephrosis. Unremarkable unopacified urinary bladder. Stomach/Bowel: A small amount of free intraperitoneal air is seen, consistent with bowel perforation. A  bulky soft tissue mass is again seen involving the ascending colon, consistent with colon carcinoma. Increased diffuse mesenteric and peritoneal soft tissue stranding and mild ascites may be due to peritonitis or peritoneal carcinomatosis. Several right pericolonic soft tissue masses or lymphadenopathy are seen, largest one anteriorly measuring 5.8 cm compared to 4.4 cm previously, and showing involvement of anterior abdominal wall soft tissues. Mildly dilated small bowel loops are similar to previous study, consistent with low-grade bowel obstruction. Vascular/Lymphatic: Right-sided pericolonic and mesenteric lymphadenopathy, consistent with metastatic disease no evidence of abdominal aortic aneurysm. Reproductive:  No mass or other significant abnormality. Other:  None. Musculoskeletal: No suspicious bone lesions identified. Diffuse osteosclerosis, consistent with sickle cell disease. IMPRESSION: Small amount of free intraperitoneal air, consistent with bowel perforation. Increased diffuse mesenteric and peritoneal soft tissue stranding and mild ascites, which may be due to peritonitis or peritoneal carcinomatosis. Bulky right-sided colonic mass, consistent with known colon carcinoma. Increased size of right pericolonic and mesenteric soft tissue masses or lymphadenopathy, consistent with metastatic disease. Low-grade bowel obstruction, without significant change. New small bilateral pleural effusions and bibasilar atelectasis. Critical Value/emergent results were called by  telephone at the time of interpretation on 04/05/2019 at 9:28 pm to provider [facility] , who verbally acknowledged these results. Electronically Signed   By: Marlaine Hind M.D.   On: 04/05/2019 21:29   Dg Chest Port 1 View  Result Date: 04/05/2019 CLINICAL DATA:  69 year old male with fever. EXAM: PORTABLE CHEST 1 VIEW COMPARISON:  Chest radiograph dated 01/16/2019 FINDINGS: Mild cardiomegaly and mild vascular congestion. No edema. Trace left pleural effusion suspected. No focal consolidation or pneumothorax. Sclerotic changes of the humeral head and spine sequela of sickle cell. No acute osseous pathology. IMPRESSION: 1. Cardiomegaly with mild vascular congestion. No focal consolidation. 2. Probable trace left pleural effusion. Electronically Signed   By: Anner Crete M.D.   On: 04/05/2019 20:08    Scheduled Meds: . enoxaparin (LOVENOX) injection  30 mg Subcutaneous Q24H  .  HYDROmorphone (DILAUDID) injection  1 mg Intravenous Q4H  . [START ON 04/07/2019] influenza vaccine adjuvanted  0.5 mL Intramuscular Tomorrow-1000  . insulin aspart  0-9 Units Subcutaneous TID WC   Continuous Infusions: . sodium chloride 50 mL/hr at 04/06/19 0006  . ceFEPime (MAXIPIME) IV       LOS: 1 day   Time spent: 30 minutes   Darliss Cheney, MD [facility]  04/06/2019, 2:07 PM   To contact the attending provider between 7A-7P or the covering provider during after hours 7P-7A, please log into the web site [email or web address] and use password [email or web address].

## 2019-04-06 NOTE — Consult Note (Signed)
Palliative Medicine   Name: [name] Date: 04/06/2019 MRN: [id number]  DOB: 11-Mar-1951  Patient Care Team: Nolene Ebbs, MD as PCP - General (Internal Medicine) Debara Pickett Nadean Corwin, MD as PCP - Cardiology (Cardiology) Donato Heinz, MD as Consulting Physician (Nephrology)    REASON FOR CONSULTATION: Palliative Care consult requested for this 68 y.o. male with multiple medical problems including ESRD not yet on hemodialysis, sickle cell disease, hypertension, diabetes, history of cocaine use, remote history of colorectal cancer, who was recently hospitalized 03/15/2019 to 04/05/2019 with weight loss and intractable nausea and vomiting.  Patient underwent exploratory laparotomy and was found to have a massive right colon tumor replacing the entire cecum and ascending colon with invasion of the peritoneum and pelvic sidewall with mesenteric studding and carcinomatosis.  He was not felt to be a surgical candidate for resection.  He was not felt to be an ideal candidate for systemic treatment unless he demonstrated clinical improvement.  Patient was discharged home with home health and palliative care with hope that he would have improved performance status and might be able to pursue some treatment in the future.  He was readmitted to the hospital on 04/06/2019 with worsening abdominal pain, fever, and falls.  Abdominal CT revealed a small pneumoperitoneum consistent with bowel perforation, increased mesenteric and peritoneal soft tissue stranding and mild ascites possibly due to peritonitis or peritoneal carcinomatosis.  Patient was admitted to the hospital and started on IV fluids and antibiotics but with a plan to primarily focus on his comfort.  He was not felt to be a surgical candidate.  Palliative care was consulted to help address goals and manage ongoing symptoms.   SOCIAL HISTORY:     reports that he quit smoking about 52 years ago. His smoking use included cigarettes. He has a  10.00 pack-year smoking history. He has never used smokeless tobacco. He reports current alcohol use. He reports previous drug use. Drugs: Cocaine and "Crack" cocaine.   Patient is divorced.  He was living at home with his brother.  He has 5 adult children.  ADVANCE DIRECTIVES:  Does not have  CODE STATUS: DNR  PAST MEDICAL HISTORY: Past Medical History:  Diagnosis Date   Arthritis    Cancer ([place])    colon   Cataracts, bilateral    surgery to remove   Diabetes mellitus    type 2   GERD (gastroesophageal reflux disease)    Heart murmur    History of kidney stones    passed stone, no surgery   Hyperlipidemia    Hypertension    Lesion of vocal cord    Pneumonia 2019   x 2   Renal insufficiency    no dialysis - stagte 5   Sickle cell anemia (HCC)    Hgb SS disease    Wears glasses     PAST SURGICAL HISTORY:  Past Surgical History:  Procedure Laterality Date   AV FISTULA PLACEMENT Left 11/22/2016   Procedure: INSERTION OF ARTERIOVENOUS (AV) GORE-TEX GRAFT  LEFT FOREARM USING 6MM X 50CM GORETEX GRAFT;  Surgeon: Serafina Mitchell, MD;  Location: [facility] OR;  Service: Vascular;  Laterality: Left;   AV FISTULA PLACEMENT Left 03/20/2019   Procedure: INSERTION OF ARTERIOVENOUS (AV) GORE-TEX GRAFT LEFT UPPER ARM;  Surgeon: Rosetta Posner, MD;  Location: [facility];  Service: Vascular;  Laterality: Left;   BIOPSY  03/18/2019   Procedure: BIOPSY;  Surgeon: Gatha Mayer, MD;  Location: [facility] ENDOSCOPY;  Service:  Endoscopy;;   CATARACT EXTRACTION W/ INTRAOCULAR LENS  IMPLANT, BILATERAL     CHOLECYSTECTOMY     CO2 LASER APPLICATION N/A 72/0/9470   Procedure: CO2 LASER EXCISION OF VOCAL FOLD LESION;  Surgeon: Melida Quitter, MD;  Location: [place];  Service: ENT;  Laterality: N/A;   COLON SURGERY     Colon cancer surgery, removed small amt not full colectomy   COLONOSCOPY     nclear who follows him for this   COLONOSCOPY WITH PROPOFOL N/A 03/18/2019   Procedure: COLONOSCOPY  WITH PROPOFOL;  Surgeon: Gatha Mayer, MD;  Location: [place];  Service: Endoscopy;  Laterality: N/A;   DIRECT LARYNGOSCOPY N/A 03/12/2019   Procedure: SUSPENDED MICRO DIRECT LARYNGOSCOPY;  Surgeon: Melida Quitter, MD;  Location: [facility] OR;  Service: ENT;  Laterality: N/A;   IR FLUORO GUIDE CV LINE RIGHT  03/17/2019   IR US GUIDE VASC ACCESS RIGHT  03/17/2019   LAPAROTOMY N/A 03/21/2019   Procedure: EXPLORATORY LAPAROTOMY;  Surgeon: Ileana Roup, MD;  Location: [place];  Service: General;  Laterality: N/A;   LEG SURGERY     For ? gangrene after running into barbwire fence at 68yo   MULTIPLE TOOTH EXTRACTIONS     no dentures    HEMATOLOGY/ONCOLOGY HISTORY:  Oncology History   No history exists.    ALLERGIES:  is allergic to morphine and related.  MEDICATIONS:  Current Facility-Administered Medications  Medication Dose Route Frequency Provider Last Rate Last Dose   0.45 % sodium chloride infusion   Intravenous Continuous Norins, Heinz Knuckles, MD 50 mL/hr at 04/06/19 0006     acetaminophen (TYLENOL) suppository 325 mg  325 mg Rectal Q4H PRN Norins, Heinz Knuckles, MD       albuterol (PROVENTIL) (2.5 MG/3ML) 0.083% nebulizer solution 3 mL  3 mL Inhalation Q6H PRN Norins, Heinz Knuckles, MD       ceFEPIme (MAXIPIME) 2 g in sodium chloride 0.9 % 100 mL IVPB  2 g Intravenous Q24H Nanavati, Ankit, MD       enoxaparin (LOVENOX) injection 30 mg  30 mg Subcutaneous Q24H Norins, Heinz Knuckles, MD   30 mg at 04/06/19 9628   HYDROmorphone (DILAUDID) injection 1 mg  1 mg Intravenous Q4H Pahwani, Einar Grad, MD   1 mg at 04/06/19 1247   [START ON 04/07/2019] influenza vaccine adjuvanted (FLUAD) injection 0.5 mL  0.5 mL Intramuscular Tomorrow-1000 Norins, Heinz Knuckles, MD       insulin aspart (novoLOG) injection 0-9 Units  0-9 Units Subcutaneous TID WC Norins, Heinz Knuckles, MD   1 Units at 04/06/19 0720   ondansetron (ZOFRAN-ODT) disintegrating tablet 4 mg  4 mg Oral Q6H PRN Norins, Heinz Knuckles, MD         VITAL SIGNS: BP 120/66 (BP Location: Right Arm)    Pulse 82    Temp 99 F (37.2 C) (Oral)    Resp 20    Ht 6' (1.829 m)    Wt 133 lb (60.3 kg)    SpO2 99%    BMI 18.04 kg/m  Filed Weights   04/05/19 1818  Weight: 133 lb (60.3 kg)    Estimated body mass index is 18.04 kg/m as calculated from the following:   Height as of this encounter: 6' (1.829 m).   Weight as of this encounter: 133 lb (60.3 kg).  LABS: CBC:    Component Value Date/Time   WBC 41.5 (H) 04/05/2019 1845   HGB 8.1 (L) 04/05/2019 1845   HCT 24.8 (L) 04/05/2019 1845  HCT 14.3 (LL) 11/07/2016 1518   PLT 385 04/05/2019 1845   MCV 78.0 (L) 04/05/2019 1845   NEUTROABS 36.5 (H) 04/05/2019 1845   LYMPHSABS 2.5 04/05/2019 1845   MONOABS 2.1 (H) 04/05/2019 1845   EOSABS 0.0 04/05/2019 1845   BASOSABS 0.4 (H) 04/05/2019 1845   Comprehensive Metabolic Panel:    Component Value Date/Time   NA 132 (L) 04/05/2019 1845   K 5.1 04/05/2019 1845   CL 89 (L) 04/05/2019 1845   CO2 32 04/05/2019 1845   BUN 35 (H) 04/05/2019 1845   CREATININE 3.94 (H) 04/05/2019 1845   GLUCOSE 208 (H) 04/05/2019 1845   CALCIUM 8.5 (L) 04/05/2019 1845   AST 21 04/05/2019 1845   ALT 8 04/05/2019 1845   ALKPHOS 84 04/05/2019 1845   BILITOT 0.9 04/05/2019 1845   PROT 6.0 (L) 04/05/2019 1845   ALBUMIN 1.9 (L) 04/05/2019 1845    RADIOGRAPHIC STUDIES: Ct Abdomen Pelvis Wo Contrast  Result Date: 04/05/2019 CLINICAL DATA:  Abdominal pain and fever. Colon carcinoma. Sickle cell disease. EXAM: CT ABDOMEN AND PELVIS WITHOUT CONTRAST TECHNIQUE: Multidetector CT imaging of the abdomen and pelvis was performed following the standard protocol without IV contrast. COMPARISON:  03/15/2019 FINDINGS: Lower chest: Small bilateral pleural effusions and mild bibasilar atelectasis. Hepatobiliary: No mass visualized on this unenhanced exam. Prior cholecystectomy. No evidence of biliary obstruction. Pancreas: No mass or inflammatory process visualized on this  unenhanced exam. Spleen: Stable small calcified spleen, consistent with auto splenectomy. Adrenals/Urinary tract: No evidence of urolithiasis or hydronephrosis. Unremarkable unopacified urinary bladder. Stomach/Bowel: A small amount of free intraperitoneal air is seen, consistent with bowel perforation. A bulky soft tissue mass is again seen involving the ascending colon, consistent with colon carcinoma. Increased diffuse mesenteric and peritoneal soft tissue stranding and mild ascites may be due to peritonitis or peritoneal carcinomatosis. Several right pericolonic soft tissue masses or lymphadenopathy are seen, largest one anteriorly measuring 5.8 cm compared to 4.4 cm previously, and showing involvement of anterior abdominal wall soft tissues. Mildly dilated small bowel loops are similar to previous study, consistent with low-grade bowel obstruction. Vascular/Lymphatic: Right-sided pericolonic and mesenteric lymphadenopathy, consistent with metastatic disease no evidence of abdominal aortic aneurysm. Reproductive:  No mass or other significant abnormality. Other:  None. Musculoskeletal: No suspicious bone lesions identified. Diffuse osteosclerosis, consistent with sickle cell disease. IMPRESSION: Small amount of free intraperitoneal air, consistent with bowel perforation. Increased diffuse mesenteric and peritoneal soft tissue stranding and mild ascites, which may be due to peritonitis or peritoneal carcinomatosis. Bulky right-sided colonic mass, consistent with known colon carcinoma. Increased size of right pericolonic and mesenteric soft tissue masses or lymphadenopathy, consistent with metastatic disease. Low-grade bowel obstruction, without significant change. New small bilateral pleural effusions and bibasilar atelectasis. Critical Value/emergent results were called by telephone at the time of interpretation on 04/05/2019 at 9:28 pm to provider [facility] , who verbally acknowledged these results.  Electronically Signed   By: Marlaine Hind M.D.   On: 04/05/2019 21:29   Ct Abdomen Pelvis Wo Contrast  Result Date: 03/15/2019 CLINICAL DATA:  Emesis, right lower quadrant pain, history of colon carcinoma EXAM: CT ABDOMEN AND PELVIS WITHOUT CONTRAST TECHNIQUE: Multidetector CT imaging of the abdomen and pelvis was performed following the standard protocol without IV contrast. COMPARISON:  02/09/2018 and previous FINDINGS: Lower chest: Linear scarring or subsegmental atelectasis posteriorly in the lower lobes as before. No pleural or pericardial effusion. Hepatobiliary: No focal liver abnormality is seen. Status post cholecystectomy. No biliary dilatation. Pancreas:  Unremarkable. No pancreatic ductal dilatation or surrounding inflammatory changes. Spleen: Small with coarse calcifications as before. Adrenals/Urinary Tract: Adrenal glands are unremarkable. Kidneys are normal, without renal calculi, focal lesion, or hydronephrosis. Bladder is unremarkable. Stomach/Bowel: Stomach decompressed. Small bowel decompressed. Marked wall thickening involving the terminal ileum and cecum without obstruction. Mild adjacent inflammatory/edematous changes. Regional adenopathy measured up to 3.2 cm , some nodes containing calcifications. The remainder of the colon is unremarkable. Vascular/Lymphatic: Right lower quadrant and central mesenteric adenopathy as above. No retroperitoneal or pelvic adenopathy. No significant vascular pathology identified. Reproductive: Uterus and bilateral adnexa are unremarkable. Other: Bilateral pelvic phleboliths.  No ascites.  No free air. Musculoskeletal: Extensive mottled lytic/sclerotic appearance of all visualized bones, probably related to sickle cell disease, present on studies dating back to 11/04/2016. No fracture. IMPRESSION: 1. Marked progression of wall thickening in the cecum, now also involving terminal ileum, with progressive regional adenopathy, suggesting progression of colon  carcinoma. Cannot exclude superimposed typhlitis given the clinical presentation and regional inflammatory/edematous change. 2. Chronic changes of sickle cell disease in visualized bones and spleen. Electronically Signed   By: Lucrezia Europe M.D.   On: 03/15/2019 13:59   Ct Chest Wo Contrast  Result Date: 03/27/2019 CLINICAL DATA:  Pt c/o upper midline chest pain; pt denies cough, or SOB. Sickle cell disease. EXAM: CT CHEST WITHOUT CONTRAST TECHNIQUE: Multidetector CT imaging of the chest was performed following the standard protocol without IV contrast. COMPARISON:  CT abdomen 03/15/2019 FINDINGS: Cardiovascular: RIGHT central venous line. No acute cardiac findings. No pericardial fluid. Mediastinum/Nodes: No axillary or supraclavicular adenopathy. No mediastinal hilar adenopathy. No pericardial effusion. Esophagus normal. Lungs/Pleura: New LEFT pleural effusion and basilar atelectasis compared to CT 03/15/2019. Mild RIGHT basilar atelectasis also increased. No focal infiltrate. No pneumothorax. 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 Musculoskeletal: Diffuse sclerosis throughout the entirety of the skeleton consistent sickle cell change. IMPRESSION: 1. New LEFT pleural effusion and basilar atelectasis. No clear evidence pneumonia. 2. Mild atelectasis at the RIGHT lung base is also new. 3. Sequelae of sickle cell disease with infarcted spleen and dense sclerosis of the bones. Electronically Signed   By: Suzy Bouchard M.D.   On: 03/27/2019 09:44   Ir Fluoro Guide Cv Line Right  Result Date: 03/18/2019 INDICATION: 68 year old male referred for central line placement EXAM: IMAGE GUIDED PLACEMENT OF CENTRAL VENOUS CATHETER MEDICATIONS: NONE ANESTHESIA/SEDATION: NONE FLUOROSCOPY TIME:  Fluoroscopy Time: 0 minutes 12 seconds (1 mGy). COMPLICATIONS: NONE PROCEDURE: Informed written consent was obtained from the patient after a thorough discussion of the  procedural risks, benefits and alternatives. All questions were addressed. Maximal Sterile Barrier Technique was utilized including caps, mask, sterile gowns, sterile gloves, sterile drape, hand hygiene and skin antiseptic. A timeout was performed prior to the initiation of the procedure. Patient was positioned supine position on fluoroscopy table. The right neck was prepped and draped in the usual sterile fashion. 1% lidocaine was used for local anesthesia. Using ultrasound guidance, micropuncture access was performed at the right IJ. Once we confirmed wire position, we measured and internal length to 2 vertebral bodies below the carina. The catheter was modified on the back table and then placed through the peel-away sheath. Final image was stored. Aspiration was confirmed at the catheter site. Catheter was sutured in position and a sterile bandage was placed. Patient tolerated the procedure well and remained hemodynamically stable throughout. No complications were encountered and no significant blood loss. IMPRESSION: Status post image  guided placement of right IJ central venous catheter. Signed, Dulcy Fanny. Dellia Nims, RPVI [facility] Electronically Signed   By: Corrie Mckusick D.O.   On: 03/18/2019 08:28   Ir US Guide Vasc Access Right  Result Date: 03/18/2019 INDICATION: 68 year old male referred for central line placement EXAM: IMAGE GUIDED PLACEMENT OF CENTRAL VENOUS CATHETER MEDICATIONS: NONE ANESTHESIA/SEDATION: NONE FLUOROSCOPY TIME:  Fluoroscopy Time: 0 minutes 12 seconds (1 mGy). COMPLICATIONS: NONE PROCEDURE: Informed written consent was obtained from the patient after a thorough discussion of the procedural risks, benefits and alternatives. All questions were addressed. Maximal Sterile Barrier Technique was utilized including caps, mask, sterile gowns, sterile gloves, sterile drape, hand hygiene and skin antiseptic. A timeout was performed  prior to the initiation of the procedure. Patient was positioned supine position on fluoroscopy table. The right neck was prepped and draped in the usual sterile fashion. 1% lidocaine was used for local anesthesia. Using ultrasound guidance, micropuncture access was performed at the right IJ. Once we confirmed wire position, we measured and internal length to 2 vertebral bodies below the carina. The catheter was modified on the back table and then placed through the peel-away sheath. Final image was stored. Aspiration was confirmed at the catheter site. Catheter was sutured in position and a sterile bandage was placed. Patient tolerated the procedure well and remained hemodynamically stable throughout. No complications were encountered and no significant blood loss. IMPRESSION: Status post image guided placement of right IJ central venous catheter. Signed, Dulcy Fanny. Dellia Nims, RPVI [facility] Electronically Signed   By: Corrie Mckusick D.O.   On: 03/18/2019 08:28   Dg Chest Port 1 View  Result Date: 04/05/2019 CLINICAL DATA:  68 year old male with fever. EXAM: PORTABLE CHEST 1 VIEW COMPARISON:  Chest radiograph dated 01/16/2019 FINDINGS: Mild cardiomegaly and mild vascular congestion. No edema. Trace left pleural effusion suspected. No focal consolidation or pneumothorax. Sclerotic changes of the humeral head and spine sequela of sickle cell. No acute osseous pathology. IMPRESSION: 1. Cardiomegaly with mild vascular congestion. No focal consolidation. 2. Probable trace left pleural effusion. Electronically Signed   By: Anner Crete M.D.   On: 04/05/2019 20:08   Dg Abd Portable 1v  Result Date: 03/23/2019 CLINICAL DATA:  NG tube placement; exploratory laparotomy performed 03/21/2019 EXAM: PORTABLE ABDOMEN - 1 VIEW COMPARISON:  CT abdomen pelvis-03/15/2019 FINDINGS: Enteric tube tip and side port project over the left expected location of mid body  of the stomach. There is moderate gaseous distension of several loops of small bowel with index loop of small bowel within the right upper abdominal quadrant measuring approximately 3.9 cm in diameter. This finding is associated with a paucity of distal colonic gas within the imaged upper abdomen. Lucency overlying the midline of the upper abdomen likely represents pneumoperitoneum as a sequela of recent postoperative state. Limited visualization of the lower thorax demonstrates bibasilar heterogeneous opacities IMPRESSION: 1. Enteric tube tip and side port project over the expected location of mid body of the stomach. 2. Gaseous distention of the small bowel with paucity of colonic gas within the imaged upper abdomen, potentially representative of ileus though could be seen in the setting of a developing small-bowel obstruction. Clinical correlation is advised. 3. Suspected pneumoperitoneum within the midline of the upper abdomen, presumably the sequela of patient's recent postoperative state. Electronically Signed   By: Sandi Mariscal M.D.   On: 03/23/2019 09:10   [place] Ekg Site Rite  Result  Date: 04/01/2019 If Site Rite image not attached, placement could not be confirmed due to current cardiac rhythm.   PERFORMANCE STATUS (ECOG) : 4 - Bedbound  Review of Systems Unless otherwise noted, a complete review of systems is negative.  Physical Exam General: Ill-appearing Pulmonary: Unlabored Extremities: no edema, no joint deformities Skin: no rashes Neurological: Weakness but otherwise nonfocal  IMPRESSION: I met with patient and his youngest son, [name].  Together, we reviewed his current medical problems.  Both patient and son verbalized an understanding that he is likely nearing end-of-life.  Both verbalized a desire to primarily focus on patient's comfort.  We discussed hospice involvement either at home or a residential hospice facility.  Patient and son say that they are leaning towards transfer  to [facility] but would like to speak with the hospice liaison to explore the visitation policy.  I called and spoke with the hospice liaison who will reach out to family.  We will place social work consult to help with disposition.  Regarding pain, will liberalize frequency of hydromorphone.  Patient says that the hydromorphone has been effective so far at controlling his pain.  Discussed the option of initiation of continuous infusion if necessary but for now patient wants to remain awake to engage with his family.  PLAN: -Best supportive/comfort care -Recommend residential hospice -Hospice liaison to meet with family -Social work consult to help coordinate disposition -Liberalize hydromorphone 0.5 to 1 mg every 2 hours as needed for pain/dyspnea -Would consider initiation of a hydromorphone infusion if necessary to control symptoms -DNR/DNI   Time Total: 60 minutes  Visit consisted of counseling and education dealing with the complex and emotionally intense issues of symptom management and palliative care in the setting of serious and potentially life-threatening illness.Greater than 50%  of this time was spent counseling and coordinating care related to the above assessment and plan.  Signed by: Altha Harm, PhD, NP-C

## 2019-04-06 NOTE — H&P (Signed)
History and Physical    [name] JHE:[id number] DOB: September 07, 1950 DOA: 04/05/2019  PCP: Nolene Ebbs, MD (Confirm with patient/family/NH records and if not entered, this has to be entered at [facility] point of entry) Patient coming from: Patient is coming from home  I have personally briefly reviewed patient's old medical records in [place]  Chief Complaint: Weakness, falls, increasing abdominal pain  HPI: [name] is a 68 y.o. male with medical history significant of hospital medical problems including diabetes CKD lipidemia, hypertension.  He had cancer of the colon 25 years ago.  He did well until recently where he had profound weight loss, anemia and weakness.  His evaluation revealed him to have widely metastatic colon cancer on laparoscopic exam and he was unresectable.  Patient was discharged from the hospital 04/03/2019.  He is hopeful to regain enough strength to be a candidate for chemotherapy for his colon cancer.  He was seen at home by hospice and at this time is a DNR.  Patient had increasing weakness, developed significant abdominal pain and fever.  For these symptoms he presented to the [facility] emergency department for evaluation.  ED Course: Laboratory revealed the patient had a white count of 41,500.  Hemoglobin 8.5 g which is stable for this patient.  Creatinine 3.94 also stable for this patient.  CT abdomen and pelvis revealed increased bulky tumor, free air in the peritoneum consistent with perforation of intestinal viscus.  Chest x-ray without infiltrates.  Patient was started on vancomycin and cefepime.  General surgery was called who felt they had nothing to offer to this patient who had recent laparotomy which revealed unresectable colon cancer.  He would not be a candidate for any surgery at this time.  TRH was consulted for admission.  Review of Systems: As per HPI otherwise 10 point review of systems negative.  Patient does report falls and increasing  weakness is noted.  He does admit to abdominal pain   Past Medical History:  Diagnosis Date   Arthritis    Cancer ([place])    colon   Cataracts, bilateral    surgery to remove   Diabetes mellitus    type 2   GERD (gastroesophageal reflux disease)    Heart murmur    History of kidney stones    passed stone, no surgery   Hyperlipidemia    Hypertension    Lesion of vocal cord    Pneumonia 2019   x 2   Renal insufficiency    no dialysis - stagte 5   Sickle cell anemia (HCC)    Hgb SS disease    Wears glasses     Past Surgical History:  Procedure Laterality Date   AV FISTULA PLACEMENT Left 11/22/2016   Procedure: INSERTION OF ARTERIOVENOUS (AV) GORE-TEX GRAFT  LEFT FOREARM USING 6MM X 50CM GORETEX GRAFT;  Surgeon: Serafina Mitchell, MD;  Location: [place];  Service: Vascular;  Laterality: Left;   AV FISTULA PLACEMENT Left 03/20/2019   Procedure: INSERTION OF ARTERIOVENOUS (AV) GORE-TEX GRAFT LEFT UPPER ARM;  Surgeon: Rosetta Posner, MD;  Location: [place] OR;  Service: Vascular;  Laterality: Left;   BIOPSY  03/18/2019   Procedure: BIOPSY;  Surgeon: Gatha Mayer, MD;  Location: [facility] ENDOSCOPY;  Service: Endoscopy;;   CATARACT EXTRACTION W/ INTRAOCULAR LENS  IMPLANT, BILATERAL     CHOLECYSTECTOMY     CO2 LASER APPLICATION N/A 18/10/6312   Procedure: CO2 LASER EXCISION OF VOCAL FOLD LESION;  Surgeon: Redmond Baseman,  Orpah Greek, MD;  Location: [place];  Service: ENT;  Laterality: N/A;   COLON SURGERY     Colon cancer surgery, removed small amt not full colectomy   COLONOSCOPY     nclear who follows him for this   COLONOSCOPY WITH PROPOFOL N/A 03/18/2019   Procedure: COLONOSCOPY WITH PROPOFOL;  Surgeon: Gatha Mayer, MD;  Location: [place];  Service: Endoscopy;  Laterality: N/A;   DIRECT LARYNGOSCOPY N/A 03/12/2019   Procedure: SUSPENDED MICRO DIRECT LARYNGOSCOPY;  Surgeon: Melida Quitter, MD;  Location: [place];  Service: ENT;  Laterality: N/A;   IR FLUORO GUIDE CV LINE RIGHT   03/17/2019   IR US GUIDE VASC ACCESS RIGHT  03/17/2019   LAPAROTOMY N/A 03/21/2019   Procedure: EXPLORATORY LAPAROTOMY;  Surgeon: Ileana Roup, MD;  Location: [place];  Service: General;  Laterality: N/A;   LEG SURGERY     For ? gangrene after running into barbwire fence at 68yo   MULTIPLE TOOTH EXTRACTIONS     no dentures   Soc Hx - married x 3, currently single. He has 5 children, innumerable grandchildren and 1 great grand. He does live alone. He worked as a Media planner. He is very close to his children and extended family including his brother and sister who may be able to provide at home care.   reports that he quit smoking about 52 years ago. His smoking use included cigarettes. He has a 10.00 pack-year smoking history. He has never used smokeless tobacco. He reports current alcohol use. He reports previous drug use. Drugs: Cocaine and "Crack" cocaine.  Allergies  Allergen Reactions   Morphine And Related Itching    Family History  Problem Relation Age of Onset   Venous thrombosis Mother        Dec. of blood clot    Diabetes type II Mother    AAA (abdominal aortic aneurysm) Mother    Throat cancer Father        Deceased of throat cancer     Prior to Admission medications   Medication Sig Start Date End Date Taking? Authorizing Provider  acetaminophen (TYLENOL) 500 MG tablet Take 1.5 tablets (750 mg total) by mouth every 6 (six) hours as needed for mild pain, fever or headache (pain). 04/03/19   Debbe Odea, MD  amLODipine (NORVASC) 5 MG tablet Take 5 mg by mouth daily. 10/04/17   [provider]  calcitRIOL (ROCALTROL) 0.25 MCG capsule Take 0.25 mcg by mouth every other day. In the morning. 04/25/18   [provider]  calcium acetate (PHOSLO) 667 MG capsule Take 667 mg by mouth 3 (three) times daily with meals.    [provider]  carvedilol (COREG) 25 MG tablet Take 25 mg by mouth 2 (two) times daily. 09/04/17   [provider]  feeding supplement, ENSURE ENLIVE, (ENSURE ENLIVE) LIQD Take 237 mLs by mouth 2 (two) times daily between meals. 04/03/19   Debbe Odea, MD  ferrous sulfate (FEOSOL) 325 (65 FE) MG tablet Take 1 tablet (325 mg total) by mouth 2 (two) times daily with a meal. Patient taking differently: Take 325 mg by mouth 2 (two) times daily.  01/18/19   Monica Becton, MD  folic acid (FOLVITE) 425 MCG tablet Take 800 mcg by mouth daily.    [provider]  gabapentin (NEURONTIN) 300 MG capsule Take 1 capsule (300 mg total) by mouth at bedtime. Patient taking differently: Take 300 mg by mouth 3 (three) times daily.  01/18/19   Monica Becton, MD  insulin aspart (NOVOLOG FLEXPEN) 100 UNIT/ML FlexPen Inject 2-6 Units into the skin 3 (three) times daily as needed for high blood sugar (CBG >150).    [provider]  Multiple Vitamin (MULTIVITAMIN WITH MINERALS) TABS tablet Take 1 tablet by mouth daily. 04/04/19   Debbe Odea, MD  omeprazole (PRILOSEC) 20 MG capsule Take 20 mg by mouth daily.     [provider]  ondansetron (ZOFRAN) 4 MG tablet Take 1 tablet (4 mg total) by mouth every 6 (six) hours as needed for nausea. 04/03/19   Debbe Odea, MD  oxyCODONE (OXY IR/ROXICODONE) 5 MG immediate release tablet Take 1 tablet (5 mg total) by mouth every 4 (four) hours as needed for moderate pain. 04/03/19   Debbe Odea, MD  Oxycodone HCl 10 MG TABS Take 1 tablet (10 mg total) by mouth every 4 (four) hours as needed. 04/03/19   Debbe Odea, MD  polyethylene glycol powder (MIRALAX) 17 GM/SCOOP powder Take 17 g by mouth daily. Mix in 8 oz liquid and drink 04/03/19   Debbe Odea, MD  sorbitol 70 % SOLN Take 30 mLs by mouth as needed for moderate constipation. 04/03/19   Debbe Odea, MD  VENTOLIN HFA 108 (90 Base) MCG/ACT inhaler Inhale 2 puffs into the lungs every 6 (six) hours as needed for wheezing.  09/03/17   [provider]    Physical Exam: Vitals:    04/05/19 2132 04/05/19 2145 04/05/19 2307 04/05/19 2315  BP:      Pulse:  88    Resp:  (!) 21  17  Temp: (!) 103.1 F (39.5 C)  99.6 F (37.6 C)   TempSrc: Oral  Oral   SpO2:  96%    Weight:      Height:        Constitutional: NAD, calm, comfortable Vitals:   04/05/19 2132 04/05/19 2145 04/05/19 2307 04/05/19 2315  BP:      Pulse:  88    Resp:  (!) 21  17  Temp: (!) 103.1 F (39.5 C)  99.6 F (37.6 C)   TempSrc: Oral  Oral   SpO2:  96%    Weight:      Height:       General appearance: a frail, thin man in no distress except for the occasional whince. Eyes: PERRL, lids and conjunctivae normal ENMT: Mucous membranes are moist. Posterior pharynx clear of any exudate or lesions. Five lower front teeth otherwise edentulous. Neck: normal, supple, no masses, no thyromegaly. IJ line right neck Respiratory: clear to auscultation bilaterally, no wheezing, no crackles. Normal respiratory effort. No accessory muscle use.  Cardiovascular: Regular rate and rhythm, no murmurs / rubs / gallops. No extremity edema. 2+ pedal pulses. No carotid bruits.  Abdomen:  Healing midline/ventral surgical wound, mildly protuberant, absent BS except for occasional BS RLQ. Very tender to palpation.  Musculoskeletal: no clubbing / cyanosis. No joint deformity upper and lower extremities. Good ROM, no contractures. Normal muscle tone.  Skin: no rashes, lesions, ulcers. No induration Neurologic: CN 2-12 grossly intact. Sensation intact, Strength 4/5 in all 4.  Psychiatric: Normal judgment and insight. Alert and oriented x 3. Normal mood.     Labs on Admission: I have personally reviewed following labs and imaging studies  CBC: Recent Labs  Lab 03/30/19 0705 03/31/19 0225 04/01/19 0704 04/02/19 0436 04/05/19 1845  WBC 22.2* 24.1* 25.9* 22.5* 41.5*  NEUTROABS  --   --   --   --  36.5*  HGB 8.1* 8.7* 8.2* 8.6* 8.1*  HCT 25.1* 26.6* 25.1* 27.3* 24.8*  MCV 80.2 79.4* 78.9* 81.0 78.0*  PLT 449* 502*  484* 383 109   Basic Metabolic Panel: Recent Labs  Lab 03/30/19 0705 03/31/19 0225 04/01/19 0704 04/02/19 0436 04/05/19 1845  NA 136 137 136 135 132*  K 3.8 3.5 4.2 4.2 5.1  CL 86* 86* 88* 89* 89*  CO2 37* 39* 36* 35* 32  GLUCOSE 184* 157* 203* 202* 208*  BUN 23 23 22 21  35*  CREATININE 2.85* 3.07* 3.22* 3.23* 3.94*  CALCIUM 8.5* 8.6* 8.7* 8.6* 8.5*  MG 1.9 2.0 2.0 2.1  --    GFR: Estimated Creatinine Clearance: 14.9 mL/min (A) (by C-G formula based on SCr of 3.94 mg/dL (H)). Liver Function Tests: Recent Labs  Lab 03/30/19 0705 03/31/19 0225 04/01/19 0704 04/02/19 0436 04/05/19 1845  AST 16 18 18 18 21   ALT 7 8 7 7 8   ALKPHOS 77 76 78 83 84  BILITOT 1.0 0.5 0.6 0.7 0.9  PROT 5.9* 6.0* 5.7* 6.1* 6.0*  ALBUMIN 1.9* 1.9* 1.9* 1.9* 1.9*   No results for input(s): LIPASE, AMYLASE in the last 168 hours. No results for input(s): AMMONIA in the last 168 hours. Coagulation Profile: Recent Labs  Lab 04/05/19 1845  INR 1.3*   Cardiac Enzymes: No results for input(s): CKTOTAL, CKMB, CKMBINDEX, TROPONINI in the last 168 hours. BNP (last 3 results) No results for input(s): PROBNP in the last 8760 hours. HbA1C: No results for input(s): HGBA1C in the last 72 hours. CBG: Recent Labs  Lab 04/02/19 1251 04/02/19 1616 04/02/19 2030 04/03/19 0753 04/03/19 1237  GLUCAP 119* 160* 72 204* 126*   Lipid Profile: No results for input(s): CHOL, HDL, LDLCALC, TRIG, CHOLHDL, LDLDIRECT in the last 72 hours. Thyroid Function Tests: No results for input(s): TSH, T4TOTAL, FREET4, T3FREE, THYROIDAB in the last 72 hours. Anemia Panel: No results for input(s): VITAMINB12, FOLATE, FERRITIN, TIBC, IRON, RETICCTPCT in the last 72 hours. Urine analysis:    Component Value Date/Time   COLORURINE YELLOW 04/05/2019 2206   APPEARANCEUR CLEAR 04/05/2019 2206   LABSPEC 1.010 04/05/2019 2206   PHURINE 8.0 04/05/2019 2206   [place] 04/05/2019 2206   [place] 04/05/2019 2206     [place] 04/05/2019 [place] 04/05/2019 2206   PROTEINUR 30 (A) 04/05/2019 2206   UROBILINOGEN 1.0 09/06/2014 2203   NITRITE NEGATIVE 04/05/2019 2206   LEUKOCYTESUR NEGATIVE 04/05/2019 2206    Radiological Exams on Admission: Ct Abdomen Pelvis Wo Contrast  Result Date: 04/05/2019 CLINICAL DATA:  Abdominal pain and fever. Colon carcinoma. Sickle cell disease. EXAM: CT ABDOMEN AND PELVIS WITHOUT CONTRAST TECHNIQUE: Multidetector CT imaging of the abdomen and pelvis was performed following the standard protocol without IV contrast. COMPARISON:  03/15/2019 FINDINGS: Lower chest: Small bilateral pleural effusions and mild bibasilar atelectasis. Hepatobiliary: No mass visualized on this unenhanced exam. Prior cholecystectomy. No evidence of biliary obstruction. Pancreas: No mass or inflammatory process visualized on this unenhanced exam. Spleen: Stable small calcified spleen, consistent with auto splenectomy. Adrenals/Urinary tract: No evidence of urolithiasis or hydronephrosis. Unremarkable unopacified urinary bladder. Stomach/Bowel: A small amount of free intraperitoneal air is seen, consistent with bowel perforation. A bulky soft tissue mass is again seen involving the ascending colon, consistent with colon carcinoma. Increased diffuse mesenteric and peritoneal soft tissue stranding and mild ascites may be due to peritonitis or peritoneal carcinomatosis. Several right pericolonic soft tissue masses or lymphadenopathy are seen, largest  one anteriorly measuring 5.8 cm compared to 4.4 cm previously, and showing involvement of anterior abdominal wall soft tissues. Mildly dilated small bowel loops are similar to previous study, consistent with low-grade bowel obstruction. Vascular/Lymphatic: Right-sided pericolonic and mesenteric lymphadenopathy, consistent with metastatic disease no evidence of abdominal aortic aneurysm. Reproductive:  No mass or other significant abnormality.  Other:  None. Musculoskeletal: No suspicious bone lesions identified. Diffuse osteosclerosis, consistent with sickle cell disease. IMPRESSION: Small amount of free intraperitoneal air, consistent with bowel perforation. Increased diffuse mesenteric and peritoneal soft tissue stranding and mild ascites, which may be due to peritonitis or peritoneal carcinomatosis. Bulky right-sided colonic mass, consistent with known colon carcinoma. Increased size of right pericolonic and mesenteric soft tissue masses or lymphadenopathy, consistent with metastatic disease. Low-grade bowel obstruction, without significant change. New small bilateral pleural effusions and bibasilar atelectasis. Critical Value/emergent results were called by telephone at the time of interpretation on 04/05/2019 at 9:28 pm to provider [facility] , who verbally acknowledged these results. Electronically Signed   By: Marlaine Hind M.D.   On: 04/05/2019 21:29   Dg Chest Port 1 View  Result Date: 04/05/2019 CLINICAL DATA:  68 year old male with fever. EXAM: PORTABLE CHEST 1 VIEW COMPARISON:  Chest radiograph dated 01/16/2019 FINDINGS: Mild cardiomegaly and mild vascular congestion. No edema. Trace left pleural effusion suspected. No focal consolidation or pneumothorax. Sclerotic changes of the humeral head and spine sequela of sickle cell. No acute osseous pathology. IMPRESSION: 1. Cardiomegaly with mild vascular congestion. No focal consolidation. 2. Probable trace left pleural effusion. Electronically Signed   By: Anner Crete M.D.   On: 04/05/2019 20:08    EKG: Independently reviewed.  Normal sinus rhythm with a intraventricular conduction delay.  No acute changes noted.  No change from previous EKG.  Assessment/Plan Active Problems:   Perforated abdominal viscus   Hypertension   Type 2 diabetes mellitus with renal complication (HCC)   Hb-SS disease without crisis ([place])   CKD (chronic kidney disease) stage 5, GFR less than 15  ml/min (HCC)   Congestive heart failure (CHF) ([place])   Colon cancer ([place])  (please populate well all problems here in Problem List. (For example, if patient is on BP meds at home and you resume or decide to hold them, it is a problem that needs to be her. Same for CAD, COPD, HLD and so on)   1.  Perforated abdominal viscus -with extensive colon cancer now with a probable perforated viscus.  He is already appearing septic with a marked leukocytosis.  With the inability to provide surgical intervention the patient is unlikely to respond to IV antibiotics in regards to resolving his infection.  However, at this time the patient would like to continue antibiotic treatment. Plan continue cefepime as ordered  2 diabetes -we will cover with sliding scale  3.  Colon cancer -patient with advanced unresectable metastatic colon cancer.  Is not a candidate for chemotherapy due to underlying problems and weakness.  This is exacerbated by perforated viscus and now overwhelming infection.  4. Hypertension - unable to take po meds.  Plan - hydralazine 5 mg IV for SBP > 180  4.  End-of-life care -discussed the patient's diagnosis and prognosis with him in detail.  Also talked with his son.  At this point the patient is amenable to hospice care and comfort care only.  He understands the terminal nature of his illness.  We discussed the concept of a good death versus prolong suffering.  At this  point the patient desires comfort care as noted Plan MedSurg admit to be able to provide IV antibiotics IV hydration  Palliative care consult for the purpose of determining best method for providing pain relief.  Family's   preference would be for the patient to be at home if possible although they are open to   Residential hospice if necessary  Discussed issue of hydration with the patient's son.  I have asked that he and the family discuss   Withholding heroic IV hydration in favor of a natural death.  I explained in detail  what they could   expect in terms of a quiet demise  Interim pain control 0.5 mg to 4 hours on schedule  DVT prophylaxis: lovenx (Lovenox/Heparin/SCD's/anticoagulated/None (if comfort care) Code Status: DNR (Full/Partial (specify details) Family Communication: spoke with son, Mr. Mike Gip. Reviewed diagnosis and prognosis. Reviewed Mr. Hulett's desire for comfort care. Reviewed heroic IVF and explained what to expect w/o IVF: renal failure/coma/ quiet demise. Family favors a solution that allows for family to be present: home vs residential hospice.  (Specify name, relationship. Do not write "discussed with patient". Specify tel # if discussed over the phone) Disposition Plan: to be determined: home vs residential hospice (specify when and where you expect patient to be discharged) Consults called: palliative care to manage pain control and disposition - per EDP (with names) Admission status: inpatinet (inpatient / obs / tele / medical floor / SDU)   Adella Hare MD [facility] Pager [phone]  If 7PM-7AM, please contact night-coverage [email or web address] Password [email or web address]  04/06/2019, 12:08 AM

## 2019-04-06 NOTE — ED Notes (Signed)
Attempted report 

## 2019-04-06 NOTE — H&P (Signed)
History and Physical    HOBSON LAX GBT:[id number] DOB: 1950-08-01 DOA: 04/05/2019  PCP: Nolene Ebbs, MD (Confirm with patient/family/NH records and if not entered, this has to be entered at [facility] point of entry) Patient coming from: Patient is coming from home  I have personally briefly reviewed patient's old medical records in [place]  Chief Complaint: Weakness, falls, increasing abdominal pain  HPI: [name] is a 68 y.o. male with medical history significant of hospital medical problems including diabetes CKD lipidemia, hypertension.  He had cancer of the colon 25 years ago.  Well until recently where he had abdominal pain anemia and weakness.  Is evaluation revealed him to have widely metastatic colon cancer on laparoscopic exam and he was unresectable.  Patient was discharged from the hospital 04/03/2019.  He is hopeful to regain enough strength to be a candidate for chemotherapy for his colon cancer.  He was seen at home by hospice and at this time is a DNR.  Patient increasing weakness, developed significant abdominal pain and fever.  For the symptoms he presented to the [facility] emergency department for evaluation.  ED Course: Laboratory revealed the patient had a white count of 41,500.  Hemoglobin 8.5 g which is stable for this patient.  Creatinine 3.94 also stable for this patient.  CT abdomen and pelvis revealed increased bulky tumor, free air in the peritoneum consistent with this case perforation none.  Chest x-ray without infiltrates.  Patient was started on vancomycin and cefepime.  General surgery was called who felt they had nothing to offer in this patient who had recent laparotomy which revealed unresectable colon cancer.  He would not be a candidate for any surgery at this time.  TRH was consulted for admission.  Review of Systems: As per HPI otherwise 10 point review of systems negative.  Patient does report falls and increasing weakness is noted.  He does  admit to abdominal pain   Past Medical History:  Diagnosis Date   Arthritis    Cancer ([place])    colon   Cataracts, bilateral    surgery to remove   Diabetes mellitus    type 2   GERD (gastroesophageal reflux disease)    Heart murmur    History of kidney stones    passed stone, no surgery   Hyperlipidemia    Hypertension    Lesion of vocal cord    Pneumonia 2019   x 2   Renal insufficiency    no dialysis - stagte 5   Sickle cell anemia (HCC)    Hgb SS disease    Wears glasses     Past Surgical History:  Procedure Laterality Date   AV FISTULA PLACEMENT Left 11/22/2016   Procedure: INSERTION OF ARTERIOVENOUS (AV) GORE-TEX GRAFT  LEFT FOREARM USING 6MM X 50CM GORETEX GRAFT;  Surgeon: Serafina Mitchell, MD;  Location: [facility] OR;  Service: Vascular;  Laterality: Left;   AV FISTULA PLACEMENT Left 03/20/2019   Procedure: INSERTION OF ARTERIOVENOUS (AV) GORE-TEX GRAFT LEFT UPPER ARM;  Surgeon: Rosetta Posner, MD;  Location: [place];  Service: Vascular;  Laterality: Left;   BIOPSY  03/18/2019   Procedure: BIOPSY;  Surgeon: Gatha Mayer, MD;  Location: [facility] ENDOSCOPY;  Service: Endoscopy;;   CATARACT EXTRACTION W/ INTRAOCULAR LENS  IMPLANT, BILATERAL     CHOLECYSTECTOMY     CO2 LASER APPLICATION N/A 71/0/6269   Procedure: CO2 LASER EXCISION OF VOCAL FOLD LESION;  Surgeon: Melida Quitter, MD;  Location:  [facility] OR;  Service: ENT;  Laterality: N/A;   COLON SURGERY     Colon cancer surgery, removed small amt not full colectomy   COLONOSCOPY     nclear who follows him for this   COLONOSCOPY WITH PROPOFOL N/A 03/18/2019   Procedure: COLONOSCOPY WITH PROPOFOL;  Surgeon: Gatha Mayer, MD;  Location: [facility] ENDOSCOPY;  Service: Endoscopy;  Laterality: N/A;   DIRECT LARYNGOSCOPY N/A 03/12/2019   Procedure: SUSPENDED MICRO DIRECT LARYNGOSCOPY;  Surgeon: Melida Quitter, MD;  Location: [place];  Service: ENT;  Laterality: N/A;   IR FLUORO GUIDE CV LINE RIGHT  03/17/2019   IR US GUIDE  VASC ACCESS RIGHT  03/17/2019   LAPAROTOMY N/A 03/21/2019   Procedure: EXPLORATORY LAPAROTOMY;  Surgeon: Ileana Roup, MD;  Location: [place];  Service: General;  Laterality: N/A;   LEG SURGERY     For ? gangrene after running into barbwire fence at 68yo   MULTIPLE TOOTH EXTRACTIONS     no dentures  soc hx - married x 3, 5 children, many grandchildren, 1 great-grand. Lives alone but is very close to family including a brother and sister who are retired and may be able to provide care in his home. He has lived a good life by his report.    reports that he quit smoking about 52 years ago. His smoking use included cigarettes. He has a 10.00 pack-year smoking history. He has never used smokeless tobacco. He reports current alcohol use. He reports previous drug use. Drugs: Cocaine and "Crack" cocaine.  Allergies  Allergen Reactions   Morphine And Related Itching    Family History  Problem Relation Age of Onset   Venous thrombosis Mother        Dec. of blood clot    Diabetes type II Mother    AAA (abdominal aortic aneurysm) Mother    Throat cancer Father        Deceased of throat cancer   Unacceptable: Noncontributory, unremarkable, or negative. Acceptable: Family history reviewed and not pertinent (If you reviewed it)  Prior to Admission medications   Medication Sig Start Date End Date Taking? Authorizing Provider  acetaminophen (TYLENOL) 500 MG tablet Take 1.5 tablets (750 mg total) by mouth every 6 (six) hours as needed for mild pain, fever or headache (pain). 04/03/19   Debbe Odea, MD  amLODipine (NORVASC) 5 MG tablet Take 5 mg by mouth daily. 10/04/17   [provider]  calcitRIOL (ROCALTROL) 0.25 MCG capsule Take 0.25 mcg by mouth every other day. In the morning. 04/25/18   [provider]  calcium acetate (PHOSLO) 667 MG capsule Take 667 mg by mouth 3 (three) times daily with meals.    [provider]  carvedilol (COREG) 25 MG tablet Take  25 mg by mouth 2 (two) times daily. 09/04/17   [provider]  feeding supplement, ENSURE ENLIVE, (ENSURE ENLIVE) LIQD Take 237 mLs by mouth 2 (two) times daily between meals. 04/03/19   Debbe Odea, MD  ferrous sulfate (FEOSOL) 325 (65 FE) MG tablet Take 1 tablet (325 mg total) by mouth 2 (two) times daily with a meal. Patient taking differently: Take 325 mg by mouth 2 (two) times daily.  01/18/19   Monica Becton, MD  folic acid (FOLVITE) 387 MCG tablet Take 800 mcg by mouth daily.    [provider]  gabapentin (NEURONTIN) 300 MG capsule Take 1 capsule (300 mg total) by mouth at bedtime. Patient taking differently: Take 300 mg by mouth 3 (  three) times daily.  01/18/19   Monica Becton, MD  insulin aspart (NOVOLOG FLEXPEN) 100 UNIT/ML FlexPen Inject 2-6 Units into the skin 3 (three) times daily as needed for high blood sugar (CBG >150).    [provider]  Multiple Vitamin (MULTIVITAMIN WITH MINERALS) TABS tablet Take 1 tablet by mouth daily. 04/04/19   Debbe Odea, MD  omeprazole (PRILOSEC) 20 MG capsule Take 20 mg by mouth daily.     [provider]  ondansetron (ZOFRAN) 4 MG tablet Take 1 tablet (4 mg total) by mouth every 6 (six) hours as needed for nausea. 04/03/19   Debbe Odea, MD  oxyCODONE (OXY IR/ROXICODONE) 5 MG immediate release tablet Take 1 tablet (5 mg total) by mouth every 4 (four) hours as needed for moderate pain. 04/03/19   Debbe Odea, MD  Oxycodone HCl 10 MG TABS Take 1 tablet (10 mg total) by mouth every 4 (four) hours as needed. 04/03/19   Debbe Odea, MD  polyethylene glycol powder (MIRALAX) 17 GM/SCOOP powder Take 17 g by mouth daily. Mix in 8 oz liquid and drink 04/03/19   Debbe Odea, MD  sorbitol 70 % SOLN Take 30 mLs by mouth as needed for moderate constipation. 04/03/19   Debbe Odea, MD  VENTOLIN HFA 108 (90 Base) MCG/ACT inhaler Inhale 2 puffs into the lungs every 6 (six) hours as needed for wheezing.  09/03/17    [provider]    Physical Exam: Vitals:   04/05/19 2132 04/05/19 2145 04/05/19 2307 04/05/19 2315  BP:      Pulse:  88    Resp:  (!) 21  17  Temp: (!) 103.1 F (39.5 C)  99.6 F (37.6 C)   TempSrc: Oral  Oral   SpO2:  96%    Weight:      Height:        Constitutional: NAD, calm, comfortable Vitals:   04/05/19 2132 04/05/19 2145 04/05/19 2307 04/05/19 2315  BP:      Pulse:  88    Resp:  (!) 21  17  Temp: (!) 103.1 F (39.5 C)  99.6 F (37.6 C)   TempSrc: Oral  Oral   SpO2:  96%    Weight:      Height:       General appearance:  Very thin, elderly man who appears ill. Eyes: PERRL, lids and conjunctivae normal ENMT: Mucous membranes are moist. Posterior pharynx clear of any exudate or lesions. Five front teeth mandible otherwise edentulous.  Neck: normal, supple, no masses, no thyromegaly. IJ in right neck. Respiratory: clear to auscultation bilaterally, no wheezing, no crackles. Normal respiratory effort. No accessory muscle use.  Cardiovascular: Regular rate and rhythm, no murmurs / rubs / gallops. No extremity edema. 2+ pedal pulses. No carotid bruits.  Abdomen: Healing ventral surgical scar. Mildly protuberant. Absent BS except few scattered BS RLQ. Very tender to palpation Musculoskeletal: no clubbing / cyanosis. No joint deformity upper and lower extremities. Good ROM, no contractures. Normal muscle tone.  Skin: no rashes, lesions, ulcers. No induration Neurologic: CN 2-12 grossly intact. Sensation intact, DTR normal. Strength 5/5 in all 4.  Psychiatric: Normal judgment and insight. Alert and oriented x 3. Normal mood.   (Anything < 9 systems with 2 bullets each down codes to level 1) (If patient refuses exam cant bill higher level) (Make sure to document decubitus ulcers present on admission -- if possible -- and whether patient has chronic indwelling catheter at time of admission)  Labs on  Admission: I have personally reviewed following labs and  imaging studies  CBC: Recent Labs  Lab 03/30/19 0705 03/31/19 0225 04/01/19 0704 04/02/19 0436 04/05/19 1845  WBC 22.2* 24.1* 25.9* 22.5* 41.5*  NEUTROABS  --   --   --   --  36.5*  HGB 8.1* 8.7* 8.2* 8.6* 8.1*  HCT 25.1* 26.6* 25.1* 27.3* 24.8*  MCV 80.2 79.4* 78.9* 81.0 78.0*  PLT 449* 502* 484* 383 174   Basic Metabolic Panel: Recent Labs  Lab 03/30/19 0705 03/31/19 0225 04/01/19 0704 04/02/19 0436 04/05/19 1845  NA 136 137 136 135 132*  K 3.8 3.5 4.2 4.2 5.1  CL 86* 86* 88* 89* 89*  CO2 37* 39* 36* 35* 32  GLUCOSE 184* 157* 203* 202* 208*  BUN 23 23 22 21  35*  CREATININE 2.85* 3.07* 3.22* 3.23* 3.94*  CALCIUM 8.5* 8.6* 8.7* 8.6* 8.5*  MG 1.9 2.0 2.0 2.1  --    GFR: Estimated Creatinine Clearance: 14.9 mL/min (A) (by C-G formula based on SCr of 3.94 mg/dL (H)). Liver Function Tests: Recent Labs  Lab 03/30/19 0705 03/31/19 0225 04/01/19 0704 04/02/19 0436 04/05/19 1845  AST 16 18 18 18 21   ALT 7 8 7 7 8   ALKPHOS 77 76 78 83 84  BILITOT 1.0 0.5 0.6 0.7 0.9  PROT 5.9* 6.0* 5.7* 6.1* 6.0*  ALBUMIN 1.9* 1.9* 1.9* 1.9* 1.9*   No results for input(s): LIPASE, AMYLASE in the last 168 hours. No results for input(s): AMMONIA in the last 168 hours. Coagulation Profile: Recent Labs  Lab 04/05/19 1845  INR 1.3*   Cardiac Enzymes: No results for input(s): CKTOTAL, CKMB, CKMBINDEX, TROPONINI in the last 168 hours. BNP (last 3 results) No results for input(s): PROBNP in the last 8760 hours. HbA1C: No results for input(s): HGBA1C in the last 72 hours. CBG: Recent Labs  Lab 04/02/19 1251 04/02/19 1616 04/02/19 2030 04/03/19 0753 04/03/19 1237  GLUCAP 119* 160* 72 204* 126*   Lipid Profile: No results for input(s): CHOL, HDL, LDLCALC, TRIG, CHOLHDL, LDLDIRECT in the last 72 hours. Thyroid Function Tests: No results for input(s): TSH, T4TOTAL, FREET4, T3FREE, THYROIDAB in the last 72 hours. Anemia Panel: No results for input(s): VITAMINB12, FOLATE,  FERRITIN, TIBC, IRON, RETICCTPCT in the last 72 hours. Urine analysis:    Component Value Date/Time   COLORURINE YELLOW 04/05/2019 2206   APPEARANCEUR CLEAR 04/05/2019 2206   LABSPEC 1.010 04/05/2019 2206   PHURINE 8.0 04/05/2019 2206   [place] 04/05/2019 2206   [place] 04/05/2019 2206   [place] 04/05/2019 [place] 04/05/2019 2206   PROTEINUR 30 (A) 04/05/2019 2206   UROBILINOGEN 1.0 09/06/2014 2203   NITRITE NEGATIVE 04/05/2019 2206   LEUKOCYTESUR NEGATIVE 04/05/2019 2206    Radiological Exams on Admission: Ct Abdomen Pelvis Wo Contrast  Result Date: 04/05/2019 CLINICAL DATA:  Abdominal pain and fever. Colon carcinoma. Sickle cell disease. EXAM: CT ABDOMEN AND PELVIS WITHOUT CONTRAST TECHNIQUE: Multidetector CT imaging of the abdomen and pelvis was performed following the standard protocol without IV contrast. COMPARISON:  03/15/2019 FINDINGS: Lower chest: Small bilateral pleural effusions and mild bibasilar atelectasis. Hepatobiliary: No mass visualized on this unenhanced exam. Prior cholecystectomy. No evidence of biliary obstruction. Pancreas: No mass or inflammatory process visualized on this unenhanced exam. Spleen: Stable small calcified spleen, consistent with auto splenectomy. Adrenals/Urinary tract: No evidence of urolithiasis or hydronephrosis. Unremarkable unopacified urinary bladder. Stomach/Bowel: A small amount of free intraperitoneal air is seen, consistent with bowel perforation.  A bulky soft tissue mass is again seen involving the ascending colon, consistent with colon carcinoma. Increased diffuse mesenteric and peritoneal soft tissue stranding and mild ascites may be due to peritonitis or peritoneal carcinomatosis. Several right pericolonic soft tissue masses or lymphadenopathy are seen, largest one anteriorly measuring 5.8 cm compared to 4.4 cm previously, and showing involvement of anterior abdominal wall soft tissues. Mildly  dilated small bowel loops are similar to previous study, consistent with low-grade bowel obstruction. Vascular/Lymphatic: Right-sided pericolonic and mesenteric lymphadenopathy, consistent with metastatic disease no evidence of abdominal aortic aneurysm. Reproductive:  No mass or other significant abnormality. Other:  None. Musculoskeletal: No suspicious bone lesions identified. Diffuse osteosclerosis, consistent with sickle cell disease. IMPRESSION: Small amount of free intraperitoneal air, consistent with bowel perforation. Increased diffuse mesenteric and peritoneal soft tissue stranding and mild ascites, which may be due to peritonitis or peritoneal carcinomatosis. Bulky right-sided colonic mass, consistent with known colon carcinoma. Increased size of right pericolonic and mesenteric soft tissue masses or lymphadenopathy, consistent with metastatic disease. Low-grade bowel obstruction, without significant change. New small bilateral pleural effusions and bibasilar atelectasis. Critical Value/emergent results were called by telephone at the time of interpretation on 04/05/2019 at 9:28 pm to provider [facility] , who verbally acknowledged these results. Electronically Signed   By: Marlaine Hind M.D.   On: 04/05/2019 21:29   Dg Chest Port 1 View  Result Date: 04/05/2019 CLINICAL DATA:  68 year old male with fever. EXAM: PORTABLE CHEST 1 VIEW COMPARISON:  Chest radiograph dated 01/16/2019 FINDINGS: Mild cardiomegaly and mild vascular congestion. No edema. Trace left pleural effusion suspected. No focal consolidation or pneumothorax. Sclerotic changes of the humeral head and spine sequela of sickle cell. No acute osseous pathology. IMPRESSION: 1. Cardiomegaly with mild vascular congestion. No focal consolidation. 2. Probable trace left pleural effusion. Electronically Signed   By: Anner Crete M.D.   On: 04/05/2019 20:08    EKG: Independently reviewed.  Normal sinus rhythm with a intraventricular  conduction delay.  No acute changes noted.  No change from previous EKG.  Assessment/Plan Active Problems:   Perforated abdominal viscus   Hypertension   Type 2 diabetes mellitus with renal complication (HCC)   Hb-SS disease without crisis ([place])   CKD (chronic kidney disease) stage 5, GFR less than 15 ml/min (HCC)   Congestive heart failure (CHF) ([place])   Colon cancer ([place])  (please populate well all problems here in Problem List. (For example, if patient is on BP meds at home and you resume or decide to hold them, it is a problem that needs to be her. Same for CAD, COPD, HLD and so on)   1.  Perforated abdominal viscus -with extensive colon cancer now with a probable perforated viscus.  He is already appearing septic with a marked cytosis.  With the inability to provide surgical intervention the patient is unlikely to respond to IV antibiotics in regards to resolving his infection.  However, at this time the patient would like to continue antibiotic treatment. Plan continue cefepime as ordered  2 diabetes -we will cover with sliding scale  3.  Colon cancer -patient with advanced unresectable metastatic colon cancer.  Is not a candidate for chemotherapy due to underlying problems and weakness.  This is exacerbated by perforated viscus and now overwhelming infection.  4.  End-of-life care -discussed the patient's diagnosis and prognosis with him in detail.  Also talked with his son.  At this point the patient is amenable to hospice care and comfort  care only.  He understands the terminal nature of his illness.  We discussed the concept of a good death versus prolong suffering.  At this point the patient desires comfort care as noted Plan MedSurg admit to be able to provide IV antibiotics IV hydration  Palliative care consult for the purpose of determining best method for providing pain relief.  Family's   preference would be for the patient to be at home if possible although they are open to      Residential hospice if necessary  Discussed issue of hydration with the patient's son.  I have asked that he and the family discuss   Withholding heroic IV hydration in favor of a natural death.  I explained in detail what they could   expect in terms of a quiet demise  Interim pain control 0.5 mg to 4 hours on schedule  DVT prophylaxis: low dose lovenox (Lovenox/Heparin/SCD's/anticoagulated/None (if comfort care) Code Status: DNR (Full/Partial (specify details) Family Communication: spoke with son Mr. Stage manager. Reviewed diagnosis and prognosis. Reviewed Mr. Delio's desire for comfort care. Reviewed the heroic nature of IVF at this point and what to expect w/o IVF and subsequent dehydration/renal failure/quite demise. Family prefers a solution that will allow for family to be present: home vs residential hospice.  (Specify name, relationship. Do not write "discussed with patient". Specify tel # if discussed over the phone) Disposition Plan: to be determined: home vs residential hospice (specify when and where you expect patient to be discharged) Consults called: Palliative care for pain control and disposition - per EDP (with names) Admission status: inpatient (inpatient / obs / tele / medical floor / SDU)   Adella Hare MD [facility] Pager [phone]  If 7PM-7AM, please contact night-coverage [email or web address] Password [email or web address]  04/06/2019, 12:08 AM

## 2019-04-06 NOTE — Progress Notes (Signed)
Manufacturing engineer [facility]) Hospital Liaison note.   Received request from Billey Chang, NP  for family interest in [facility]. Chart reviewed and spoke with family to acknowledge referral.  Unfortunately [facility] is not able to offer a room today. Family and CSW are aware [facility] liaison will follow up with CSW and family tomorrow or sooner if room becomes available.  Please do not hesitate to call with questions.   Thank you,    Teagan Heidrick, RN, [facility]   [place]     [place] are on AMION

## 2019-04-06 NOTE — Progress Notes (Signed)
Night shift RN spoke with Pt. Daughter via phone.  Visiting policy was explained, to the daughter in full detail.  RN informed charge nurse and dayshift nurse, concerning visitation from Pt daughters.  Pt agreed to RN discussing his care to his daughter.  Daughter is employed with [employer].  Pt has access to room phone, to accept calls.

## 2019-04-06 NOTE — Progress Notes (Signed)
CSW acknowledges consult for residential hospice and CSW reached out to [facility]. She reported that she is aware of consult and has reached out to [facility].

## 2019-04-07 DIAGNOSIS — J9601 Acute respiratory failure with hypoxia: Secondary | ICD-10-CM

## 2019-04-07 DIAGNOSIS — D571 Sickle-cell disease without crisis: Secondary | ICD-10-CM

## 2019-04-07 LAB — GLUCOSE, CAPILLARY
Glucose-Capillary: 106 mg/dL — ABNORMAL HIGH (ref 70–99)
Glucose-Capillary: 127 mg/dL — ABNORMAL HIGH (ref 70–99)

## 2019-04-07 LAB — URINE CULTURE

## 2019-04-07 MED ORDER — HYDROMORPHONE HCL 2 MG PO TABS: 2.0000 mg | ORAL_TABLET | Freq: Four times a day (QID) | ORAL | 0 refills | Status: AC | PRN

## 2019-04-07 MED ORDER — ALBUTEROL SULFATE (2.5 MG/3ML) 0.083% IN NEBU: 3.0000 mL | INHALATION_SOLUTION | Freq: Four times a day (QID) | RESPIRATORY_TRACT | 12 refills | Status: AC | PRN

## 2019-04-07 NOTE — Plan of Care (Signed)

## 2019-04-07 NOTE — Progress Notes (Signed)
[employer] available today for [name]. Paper work completed with son [name]. Dr. Duanne Guess to assume care.   Please send discharge summary to [phone].  RN please call report to [phone].  Thank you,  Justin Conte, LCSW [phone]

## 2019-04-07 NOTE — Discharge Summary (Addendum)
Physician Discharge Summary  [name] OFB:[id number] DOB: [birth year] DOA: 04/05/2019  PCP: Nolene Ebbs, MD  Admit date: 04/05/2019 Discharge date: 04/07/2019  Admitted From: Home Disposition: Residential hospice  Recommendations for Outpatient Follow-up:  Patient will be discharged to [facility] for end-of-life care   Discharge Condition: Hospice CODE STATUS: DNR Diet recommendation: Regular diet for comfort  Brief/Interim Summary: 68 year old male with a history of diabetes, hyperlipidemia, hypertension, cancer of the colon 25 years ago, patient had been doing fairly well until he developed profound weight loss anemia and weakness.  Recent evaluation showed widely metastatic colon cancer and was not resectable.  Plan was to initially start chemotherapy, the patient developed increasing weakness, abdominal pain and fever.  He returned to the emergency room for evaluation where he was noted to have worsening renal failure.  CT of the abdomen and pelvis showed bulky tumor, free air in the peritoneum consistent with perforation of intestinal viscus.  He was evaluated by general surgery who did not feel that he was a candidate for any surgical management.  Patient was admitted to the hospital on intravenous antibiotics.  He was seen by palliative care for further goals of care and it was elected by patient family to pursue comfort approach.  Patient's antibiotics discontinued and his care was focused towards comfort.  He is being transferred to [facility] for end-of-life care.  Discharge Diagnoses:  Active Problems:   Hypertension   Type 2 diabetes mellitus with renal complication (HCC)   Hb-SS disease without crisis ([place])   CKD (chronic kidney disease) stage 5, GFR less than 15 ml/min (HCC)   Congestive heart failure (CHF) (HCC)   Colon cancer ([place])   Perforated abdominal viscus   Palliative care encounter  Sepsis secondary to clostridium septicum, ruled in, present on  admission  Chronic diastolic CHF  Discharge Instructions  Discharge Instructions     Diet - low sodium heart healthy   Complete by: As directed    Increase activity slowly   Complete by: As directed       Allergies as of 04/07/2019       Reactions   Morphine And Related Itching        Medication List     STOP taking these medications    amLODipine 5 MG tablet Commonly known as: NORVASC   atorvastatin 20 MG tablet Commonly known as: LIPITOR   calcitRIOL 0.25 MCG capsule Commonly known as: ROCALTROL   calcium acetate 667 MG capsule Commonly known as: PHOSLO   carvedilol 25 MG tablet Commonly known as: COREG   feeding supplement (ENSURE ENLIVE) Liqd   ferrous sulfate 325 (65 FE) MG tablet Commonly known as: Feosol   folic acid 782 MCG tablet Commonly known as: FOLVITE   gabapentin 300 MG capsule Commonly known as: NEURONTIN   multivitamin with minerals Tabs tablet   omeprazole 20 MG capsule Commonly known as: PRILOSEC   ondansetron 4 MG tablet Commonly known as: ZOFRAN   oxyCODONE 5 MG immediate release tablet Commonly known as: Oxy IR/ROXICODONE   Oxycodone HCl 10 MG Tabs   sorbitol 70 % Soln   Ventolin HFA 108 (90 Base) MCG/ACT inhaler Generic drug: albuterol Replaced by: albuterol (2.5 MG/3ML) 0.083% nebulizer solution       TAKE these medications    acetaminophen 500 MG tablet Commonly known as: TYLENOL Take 1.5 tablets (750 mg total) by mouth every 6 (six) hours as needed for mild pain, fever or headache (pain).   albuterol (2.5  MG/3ML) 0.083% nebulizer solution Commonly known as: PROVENTIL Inhale 3 mLs into the lungs every 6 (six) hours as needed for wheezing. Replaces: Ventolin HFA 108 (90 Base) MCG/ACT inhaler   HYDROmorphone 2 MG tablet Commonly known as: Dilaudid Take 1-2 tablets (2-4 mg total) by mouth every 6 (six) hours as needed for up to 5 days for severe pain.   NovoLOG FlexPen 100 UNIT/ML FlexPen Generic drug:  insulin aspart Inject 2-6 Units into the skin 3 (three) times daily as needed for high blood sugar (CBG >150).   polyethylene glycol powder 17 GM/SCOOP powder Commonly known as: MiraLax Take 17 g by mouth daily. Mix in 8 oz liquid and drink         Allergies  Allergen Reactions   Morphine And Related Itching    Consultations: Palliative care   Procedures/Studies: Ct Abdomen Pelvis Wo Contrast  Result Date: 04/05/2019 CLINICAL DATA:  Abdominal pain and fever. Colon carcinoma. Sickle cell disease. EXAM: CT ABDOMEN AND PELVIS WITHOUT CONTRAST TECHNIQUE: Multidetector CT imaging of the abdomen and pelvis was performed following the standard protocol without IV contrast. COMPARISON:  03/15/2019 FINDINGS: Lower chest: Small bilateral pleural effusions and mild bibasilar atelectasis. Hepatobiliary: No mass visualized on this unenhanced exam. Prior cholecystectomy. No evidence of biliary obstruction. Pancreas: No mass or inflammatory process visualized on this unenhanced exam. Spleen: Stable small calcified spleen, consistent with auto splenectomy. Adrenals/Urinary tract: No evidence of urolithiasis or hydronephrosis. Unremarkable unopacified urinary bladder. Stomach/Bowel: A small amount of free intraperitoneal air is seen, consistent with bowel perforation. A bulky soft tissue mass is again seen involving the ascending colon, consistent with colon carcinoma. Increased diffuse mesenteric and peritoneal soft tissue stranding and mild ascites may be due to peritonitis or peritoneal carcinomatosis. Several right pericolonic soft tissue masses or lymphadenopathy are seen, largest one anteriorly measuring 5.8 cm compared to 4.4 cm previously, and showing involvement of anterior abdominal wall soft tissues. Mildly dilated small bowel loops are similar to previous study, consistent with low-grade bowel obstruction. Vascular/Lymphatic: Right-sided pericolonic and mesenteric lymphadenopathy, consistent with  metastatic disease no evidence of abdominal aortic aneurysm. Reproductive:  No mass or other significant abnormality. Other:  None. Musculoskeletal: No suspicious bone lesions identified. Diffuse osteosclerosis, consistent with sickle cell disease. IMPRESSION: Small amount of free intraperitoneal air, consistent with bowel perforation. Increased diffuse mesenteric and peritoneal soft tissue stranding and mild ascites, which may be due to peritonitis or peritoneal carcinomatosis. Bulky right-sided colonic mass, consistent with known colon carcinoma. Increased size of right pericolonic and mesenteric soft tissue masses or lymphadenopathy, consistent with metastatic disease. Low-grade bowel obstruction, without significant change. New small bilateral pleural effusions and bibasilar atelectasis. Critical Value/emergent results were called by telephone at the time of interpretation on 04/05/2019 at 9:28 pm to provider [facility] , who verbally acknowledged these results. Electronically Signed   By: Marlaine Hind M.D.   On: 04/05/2019 21:29   Ct Abdomen Pelvis Wo Contrast  Result Date: 03/15/2019 CLINICAL DATA:  Emesis, right lower quadrant pain, history of colon carcinoma EXAM: CT ABDOMEN AND PELVIS WITHOUT CONTRAST TECHNIQUE: Multidetector CT imaging of the abdomen and pelvis was performed following the standard protocol without IV contrast. COMPARISON:  02/09/2018 and previous FINDINGS: Lower chest: Linear scarring or subsegmental atelectasis posteriorly in the lower lobes as before. No pleural or pericardial effusion. Hepatobiliary: No focal liver abnormality is seen. Status post cholecystectomy. No biliary dilatation. Pancreas: Unremarkable. No pancreatic ductal dilatation or surrounding inflammatory changes. Spleen: Small with coarse calcifications as before.  Adrenals/Urinary Tract: Adrenal glands are unremarkable. Kidneys are normal, without renal calculi, focal lesion, or hydronephrosis. Bladder is  unremarkable. Stomach/Bowel: Stomach decompressed. Small bowel decompressed. Marked wall thickening involving the terminal ileum and cecum without obstruction. Mild adjacent inflammatory/edematous changes. Regional adenopathy measured up to 3.2 cm , some nodes containing calcifications. The remainder of the colon is unremarkable. Vascular/Lymphatic: Right lower quadrant and central mesenteric adenopathy as above. No retroperitoneal or pelvic adenopathy. No significant vascular pathology identified. Reproductive: Uterus and bilateral adnexa are unremarkable. Other: Bilateral pelvic phleboliths.  No ascites.  No free air. Musculoskeletal: Extensive mottled lytic/sclerotic appearance of all visualized bones, probably related to sickle cell disease, present on studies dating back to 11/04/2016. No fracture. IMPRESSION: 1. Marked progression of wall thickening in the cecum, now also involving terminal ileum, with progressive regional adenopathy, suggesting progression of colon carcinoma. Cannot exclude superimposed typhlitis given the clinical presentation and regional inflammatory/edematous change. 2. Chronic changes of sickle cell disease in visualized bones and spleen. Electronically Signed   By: Lucrezia Europe M.D.   On: 03/15/2019 13:59   Ct Chest Wo Contrast  Result Date: 03/27/2019 CLINICAL DATA:  Pt c/o upper midline chest pain; pt denies cough, or SOB. Sickle cell disease. EXAM: CT CHEST WITHOUT CONTRAST TECHNIQUE: Multidetector CT imaging of the chest was performed following the standard protocol without IV contrast. COMPARISON:  CT abdomen 03/15/2019 FINDINGS: Cardiovascular: RIGHT central venous line. No acute cardiac findings. No pericardial fluid. Mediastinum/Nodes: No axillary or supraclavicular adenopathy. No mediastinal hilar adenopathy. No pericardial effusion. Esophagus normal. Lungs/Pleura: New LEFT pleural effusion and basilar atelectasis compared to CT 03/15/2019. Mild RIGHT basilar atelectasis  also increased. No focal infiltrate. No pneumothorax. 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 Musculoskeletal: Diffuse sclerosis throughout the entirety of the skeleton consistent sickle cell change. IMPRESSION: 1. New LEFT pleural effusion and basilar atelectasis. No clear evidence pneumonia. 2. Mild atelectasis at the RIGHT lung base is also new. 3. Sequelae of sickle cell disease with infarcted spleen and dense sclerosis of the bones. Electronically Signed   By: Suzy Bouchard M.D.   On: 03/27/2019 09:44   Ir Fluoro Guide Cv Line Right  Result Date: 03/18/2019 INDICATION: 68 year old male referred for central line placement EXAM: IMAGE GUIDED PLACEMENT OF CENTRAL VENOUS CATHETER MEDICATIONS: NONE ANESTHESIA/SEDATION: NONE FLUOROSCOPY TIME:  Fluoroscopy Time: 0 minutes 12 seconds (1 mGy). COMPLICATIONS: NONE PROCEDURE: Informed written consent was obtained from the patient after a thorough discussion of the procedural risks, benefits and alternatives. All questions were addressed. Maximal Sterile Barrier Technique was utilized including caps, mask, sterile gowns, sterile gloves, sterile drape, hand hygiene and skin antiseptic. A timeout was performed prior to the initiation of the procedure. Patient was positioned supine position on fluoroscopy table. The right neck was prepped and draped in the usual sterile fashion. 1% lidocaine was used for local anesthesia. Using ultrasound guidance, micropuncture access was performed at the right IJ. Once we confirmed wire position, we measured and internal length to 2 vertebral bodies below the carina. The catheter was modified on the back table and then placed through the peel-away sheath. Final image was stored. Aspiration was confirmed at the catheter site. Catheter was sutured in position and a sterile bandage was placed. Patient tolerated the procedure well and remained hemodynamically stable throughout. No  complications were encountered and no significant blood loss. IMPRESSION: Status post image guided placement of right IJ central venous catheter. Signed, Dulcy Fanny. Dellia Nims, [place] [facility] Electronically Signed   By: Corrie Mckusick D.O.   On: 03/18/2019 08:28   Ir US Guide Vasc Access Right  Result Date: 03/18/2019 INDICATION: 68 year old male referred for central line placement EXAM: IMAGE GUIDED PLACEMENT OF CENTRAL VENOUS CATHETER MEDICATIONS: NONE ANESTHESIA/SEDATION: NONE FLUOROSCOPY TIME:  Fluoroscopy Time: 0 minutes 12 seconds (1 mGy). COMPLICATIONS: NONE PROCEDURE: Informed written consent was obtained from the patient after a thorough discussion of the procedural risks, benefits and alternatives. All questions were addressed. Maximal Sterile Barrier Technique was utilized including caps, mask, sterile gowns, sterile gloves, sterile drape, hand hygiene and skin antiseptic. A timeout was performed prior to the initiation of the procedure. Patient was positioned supine position on fluoroscopy table. The right neck was prepped and draped in the usual sterile fashion. 1% lidocaine was used for local anesthesia. Using ultrasound guidance, micropuncture access was performed at the right IJ. Once we confirmed wire position, we measured and internal length to 2 vertebral bodies below the carina. The catheter was modified on the back table and then placed through the peel-away sheath. Final image was stored. Aspiration was confirmed at the catheter site. Catheter was sutured in position and a sterile bandage was placed. Patient tolerated the procedure well and remained hemodynamically stable throughout. No complications were encountered and no significant blood loss. IMPRESSION: Status post image guided placement of right IJ central venous catheter. Signed, Dulcy Fanny. Dellia Nims, [facility] Radiology  Electronically Signed   By: Corrie Mckusick D.O.   On: 03/18/2019 08:28   Dg Chest Port 1 View  Result Date: 04/05/2019 CLINICAL DATA:  68 year old male with fever. EXAM: PORTABLE CHEST 1 VIEW COMPARISON:  Chest radiograph dated 01/16/2019 FINDINGS: Mild cardiomegaly and mild vascular congestion. No edema. Trace left pleural effusion suspected. No focal consolidation or pneumothorax. Sclerotic changes of the humeral head and spine sequela of sickle cell. No acute osseous pathology. IMPRESSION: 1. Cardiomegaly with mild vascular congestion. No focal consolidation. 2. Probable trace left pleural effusion. Electronically Signed   By: Anner Crete M.D.   On: 04/05/2019 20:08   Dg Abd Portable 1v  Result Date: 03/23/2019 CLINICAL DATA:  NG tube placement; exploratory laparotomy performed 03/21/2019 EXAM: PORTABLE ABDOMEN - 1 VIEW COMPARISON:  CT abdomen pelvis-03/15/2019 FINDINGS: Enteric tube tip and side port project over the left expected location of mid body of the stomach. There is moderate gaseous distension of several loops of small bowel with index loop of small bowel within the right upper abdominal quadrant measuring approximately 3.9 cm in diameter. This finding is associated with a paucity of distal colonic gas within the imaged upper abdomen. Lucency overlying the midline of the upper abdomen likely represents pneumoperitoneum as a sequela of recent postoperative state. Limited visualization of the lower thorax demonstrates bibasilar heterogeneous opacities IMPRESSION: 1. Enteric tube tip and side port project over the expected location of mid body of the stomach. 2. Gaseous distention of the small bowel with paucity of colonic gas within the imaged upper abdomen, potentially representative of ileus though could be seen in the setting of a developing small-bowel obstruction. Clinical correlation is advised. 3. Suspected pneumoperitoneum within the midline of the upper abdomen, presumably the  sequela of patient's recent postoperative state. Electronically Signed   By: Sandi Mariscal M.D.   On: 03/23/2019 09:10   [place] Ekg Site Rite  Result Date: 04/01/2019 If Site Rite image not attached, placement could not be confirmed due to  current cardiac rhythm.     Subjective: Patient reports the pain is reasonably controlled.  No nausea or vomiting.  Discharge Exam: Vitals:   04/06/19 2129 04/07/19 0047 04/07/19 0521 04/07/19 1232  BP: 114/67 120/66 (!) 173/70 (!) 109/56  Pulse: 76 75 82 77  Resp: 20 20 18 18   Temp: 98.7 F (37.1 C) 98.7 F (37.1 C) 98 F (36.7 C) 98.2 F (36.8 C)  TempSrc: Oral Oral Oral Oral  SpO2: 100% 100% 100% 100%  Weight:   59 kg   Height:        General: Pt is alert, awake, not in acute distress Cardiovascular: RRR, S1/S2 +, no rubs, no gallops Respiratory: CTA bilaterally, no wheezing, no rhonchi Abdominal: Soft, diffuse tenderness throughout abdomen, ND, bowel sounds + Extremities: no edema, no cyanosis    The results of significant diagnostics from this hospitalization (including imaging, microbiology, ancillary and laboratory) are listed below for reference.     Microbiology: Recent Results (from the past 240 hour(s))  Culture, blood (Routine x 2)     Status: None (Preliminary result)   Collection Time: 04/05/19  6:45 PM   Specimen: BLOOD RIGHT ARM  Result Value Ref Range Status   Specimen Description BLOOD RIGHT ARM  Final   Special Requests   Final    BOTTLES DRAWN AEROBIC AND ANAEROBIC Blood Culture adequate volume   Culture  Setup Time   Final    ANAEROBIC BOTTLE ONLY GRAM VARIABLE ROD CRITICAL RESULT CALLED TO, READ BACK BY AND VERIFIED WITH: L SEAY PHARMD 04/06/19 6629 JDW    Culture   Final    NO GROWTH 2 DAYS Performed at [facility], [place] [address], [place], [place] [address]    Report Status PENDING  Incomplete  Culture, blood (Routine x 2)     Status: None (Preliminary result)   Collection Time: 04/05/19  6:45 PM    Specimen: BLOOD RIGHT ARM  Result Value Ref Range Status   Specimen Description BLOOD RIGHT ARM  Final   Special Requests   Final    BOTTLES DRAWN AEROBIC AND ANAEROBIC Blood Culture adequate volume   Culture  Setup Time   Final    ANAEROBIC BOTTLE ONLY GRAM VARIABLE ROD CRITICAL VALUE NOTED.  VALUE IS CONSISTENT WITH PREVIOUSLY REPORTED AND CALLED VALUE.    Culture   Final    NO GROWTH 2 DAYS Performed at [facility], [place] [address] [place], [place] [address]    Report Status PENDING  Incomplete  SARS CORONAVIRUS 2 (TAT 6-24 HRS) Nasopharyngeal Nasopharyngeal Swab     Status: None   Collection Time: 04/05/19  8:20 PM   Specimen: Nasopharyngeal Swab  Result Value Ref Range Status   SARS Coronavirus 2 NEGATIVE NEGATIVE Final    Comment: (NOTE) SARS-CoV-2 target nucleic acids are NOT DETECTED. The SARS-CoV-2 RNA is generally detectable in upper and lower respiratory specimens during the acute phase of infection. Negative results do not preclude SARS-CoV-2 infection, do not rule out co-infections with other pathogens, and should not be used as the sole basis for treatment or other patient management decisions. Negative results must be combined with clinical observations, patient history, and epidemiological information. The expected result is Negative. Fact Sheet for Patients: SugarRoll.be Fact Sheet for Healthcare Providers: https://www.woods-mathews.com/ This test is not yet approved or cleared by the [place] FDA and  has been authorized for detection and/or diagnosis of SARS-CoV-2 by FDA under an Emergency Use Authorization (EUA). This EUA  will remain  in effect (meaning this test can be used) for the duration of the COVID-19 declaration under Section 56 4(b)(1) of the Act, 21 U.S.C. section 360bbb-3(b)(1), unless the authorization is terminated or revoked sooner. Performed at [facility], [place] [address],  [place], [place] [address]   Urine culture     Status: Abnormal   Collection Time: 04/05/19 10:06 PM   Specimen: Urine, Clean Catch  Result Value Ref Range Status   Specimen Description URINE, CLEAN CATCH  Final   Special Requests   Final    NONE Performed at [facility], [place] [address] [place], [place] [address]    Culture MULTIPLE SPECIES PRESENT, SUGGEST RECOLLECTION (A)  Final   Report Status 04/07/2019 FINAL  Final     Labs: BNP (last 3 results) Recent Labs    01/16/19 1820  BNP 9,381.8*   Basic Metabolic Panel: Recent Labs  Lab 04/01/19 0704 04/02/19 0436 04/05/19 1845  NA 136 135 132*  K 4.2 4.2 5.1  CL 88* 89* 89*  CO2 36* 35* 32  GLUCOSE 203* 202* 208*  BUN 22 21 35*  CREATININE 3.22* 3.23* 3.94*  CALCIUM 8.7* 8.6* 8.5*  MG 2.0 2.1  --    Liver Function Tests: Recent Labs  Lab 04/01/19 0704 04/02/19 0436 04/05/19 1845  AST 18 18 21   ALT 7 7 8   ALKPHOS 78 83 84  BILITOT 0.6 0.7 0.9  PROT 5.7* 6.1* 6.0*  ALBUMIN 1.9* 1.9* 1.9*   No results for input(s): LIPASE, AMYLASE in the last 168 hours. No results for input(s): AMMONIA in the last 168 hours. CBC: Recent Labs  Lab 04/01/19 0704 04/02/19 0436 04/05/19 1845  WBC 25.9* 22.5* 41.5*  NEUTROABS  --   --  36.5*  HGB 8.2* 8.6* 8.1*  HCT 25.1* 27.3* 24.8*  MCV 78.9* 81.0 78.0*  PLT 484* 383 385   Cardiac Enzymes: No results for input(s): CKTOTAL, CKMB, CKMBINDEX, TROPONINI in the last 168 hours. BNP: Invalid input(s): POCBNP CBG: Recent Labs  Lab 04/06/19 1216 04/06/19 1632 04/06/19 2117 04/07/19 0607 04/07/19 1108  GLUCAP 102* 114* 141* 106* 127*   D-Dimer No results for input(s): DDIMER in the last 72 hours. Hgb A1c No results for input(s): HGBA1C in the last 72 hours. Lipid Profile No results for input(s): CHOL, HDL, LDLCALC, TRIG, CHOLHDL, LDLDIRECT in the last 72 hours. Thyroid function studies No results for input(s): TSH, T4TOTAL, T3FREE, THYROIDAB in the last 72  hours.  Invalid input(s): FREET3 Anemia work up No results for input(s): VITAMINB12, FOLATE, FERRITIN, TIBC, IRON, RETICCTPCT in the last 72 hours. Urinalysis    Component Value Date/Time   COLORURINE YELLOW 04/05/2019 2206   APPEARANCEUR CLEAR 04/05/2019 2206   LABSPEC 1.010 04/05/2019 2206   PHURINE 8.0 04/05/2019 2206   GLUCOSEU NEGATIVE 04/05/2019 2206   HGBUR NEGATIVE 04/05/2019 2206   [place] 04/05/2019 [place] 04/05/2019 2206   PROTEINUR 30 (A) 04/05/2019 2206   UROBILINOGEN 1.0 09/06/2014 2203   NITRITE NEGATIVE 04/05/2019 2206   LEUKOCYTESUR NEGATIVE 04/05/2019 2206   Sepsis Labs Invalid input(s): PROCALCITONIN,  WBC,  LACTICIDVEN Microbiology Recent Results (from the past 240 hour(s))  Culture, blood (Routine x 2)     Status: None (Preliminary result)   Collection Time: 04/05/19  6:45 PM   Specimen: BLOOD RIGHT ARM  Result Value Ref Range Status   Specimen Description BLOOD RIGHT ARM  Final   Special Requests   Final  BOTTLES DRAWN AEROBIC AND ANAEROBIC Blood Culture adequate volume   Culture  Setup Time   Final    ANAEROBIC BOTTLE ONLY GRAM VARIABLE ROD CRITICAL RESULT CALLED TO, READ BACK BY AND VERIFIED WITH: L SEAY PHARMD 04/06/19 8657 JDW    Culture   Final    NO GROWTH 2 DAYS Performed at [facility], [place] [address] [place], [place] [address]    Report Status PENDING  Incomplete  Culture, blood (Routine x 2)     Status: None (Preliminary result)   Collection Time: 04/05/19  6:45 PM   Specimen: BLOOD RIGHT ARM  Result Value Ref Range Status   Specimen Description BLOOD RIGHT ARM  Final   Special Requests   Final    BOTTLES DRAWN AEROBIC AND ANAEROBIC Blood Culture adequate volume   Culture  Setup Time   Final    ANAEROBIC BOTTLE ONLY GRAM VARIABLE ROD CRITICAL VALUE NOTED.  VALUE IS CONSISTENT WITH PREVIOUSLY REPORTED AND CALLED VALUE.    Culture   Final    NO GROWTH 2 DAYS Performed at [facility], [place] [address], [place], [place] [address]    Report Status PENDING  Incomplete  SARS CORONAVIRUS 2 (TAT 6-24 HRS) Nasopharyngeal Nasopharyngeal Swab     Status: None   Collection Time: 04/05/19  8:20 PM   Specimen: Nasopharyngeal Swab  Result Value Ref Range Status   SARS Coronavirus 2 NEGATIVE NEGATIVE Final    Comment: (NOTE) SARS-CoV-2 target nucleic acids are NOT DETECTED. The SARS-CoV-2 RNA is generally detectable in upper and lower respiratory specimens during the acute phase of infection. Negative results do not preclude SARS-CoV-2 infection, do not rule out co-infections with other pathogens, and should not be used as the sole basis for treatment or other patient management decisions. Negative results must be combined with clinical observations, patient history, and epidemiological information. The expected result is Negative. Fact Sheet for Patients: Sugar[employer] Fact Sheet for Healthcare Providers: https://www.woods-mathews.com/ This test is not yet approved or cleared by the [place] FDA and  has been authorized for detection and/or diagnosis of SARS-CoV-2 by FDA under an Emergency Use Authorization (EUA). This EUA will remain  in effect (meaning this test can be used) for the duration of the COVID-19 declaration under Section 56 4(b)(1) of the Act, 21 U.S.C. section 360bbb-3(b)(1), unless the authorization is terminated or revoked sooner. Performed at [facility], [place] [address], [place], [place] [address]   Urine culture     Status: Abnormal   Collection Time: 04/05/19 10:06 PM   Specimen: Urine, Clean Catch  Result Value Ref Range Status   Specimen Description URINE, CLEAN CATCH  Final   Special Requests   Final    NONE Performed at [facility], [place] [address] [place], [place] [address]    Culture MULTIPLE SPECIES PRESENT, SUGGEST RECOLLECTION (A)  Final   Report Status 04/07/2019 FINAL  Final      Time coordinating discharge: 7mins  SIGNED:   Kathie Dike, MD  [facility] 04/07/2019, 1:49 PM   If 7PM-7AM, please contact night-coverage [email or web address]

## 2019-04-07 NOTE — Progress Notes (Signed)
Nutrition Brief Note  Chart reviewed. Pt now transitioning to comfort care.  No further nutrition interventions warranted at this time.  Please re-consult as needed.   [place] A. Eilyn Polack, RD, LDN, CDCES Registered Dietitian II Certified Diabetes Care and Education Specialist Pager: [phone] After hours Pager: [phone]  

## 2019-04-07 NOTE — TOC Transition Note (Addendum)
Transition of Care [facility]) - CM/SW Discharge Note   Patient Details  Name: [name] MRN: [id number] Date of Birth: 10-18-1950  Transition of Care [facility]) CM/SW Contact:  Alberteen Sam,  Phone Number: [phone] 04/07/2019, 2:16 PM   Clinical Narrative:     Patient will DC to: [place] date: 04/07/2019 Family notified: [name] - no answer lvm Transport GL:OVFI  Per MD patient ready for DC to [facility] . RN, patient, patient's family, and facility notified of DC. Discharge Summary sent to facility. RN given number for report   [phone]. DC packet on chart. Ambulance transport requested for patient.  CSW signing off.  [place], [place]   Final next level of care: [place] Barriers to Discharge: No Barriers Identified   Patient Goals and CMS Choice   CMS Medicare.gov Compare Post Acute Care list provided to:: Patient Represenative (must comment)(son [name]) Choice offered to / list presented to : Adult Children  Discharge Placement              Patient chooses bed at: Other - please specify in the comment section below:([facility]) Patient to be transferred to facility by: [facility] Name of family member notified: [name] Patient and family notified of of transfer: 04/07/19  Discharge Plan and Services                                     Social Determinants of Health (SDOH) Interventions     Readmission Risk Interventions Readmission Risk Prevention Plan 03/30/2019 03/30/2019 03/19/2019  Transportation Screening Complete Complete Complete  PCP or Specialist Appt within 3-5 Days - - -  HRI or [place] for [place] - - -  Medication Review Press photographer) Complete Complete Complete  PCP or Specialist appointment within 3-5 days of discharge Complete Complete Complete  HRI or Home Care Consult Complete  Complete Complete  SW Recovery Care/Counseling Consult Complete Complete Complete  Palliative Care Screening Complete Complete Not Applicable  Skilled Nursing Facility Complete Complete Complete  Some recent data might be hidden

## 2019-04-08 LAB — CULTURE, BLOOD (ROUTINE X 2)
Special Requests: ADEQUATE
Special Requests: ADEQUATE

## 2019-05-06 DEATH — deceased
